# Patient Record
Sex: Male | Born: 1945 | Race: White | Hispanic: No | Marital: Married | Smoking: Never smoker
Health system: Southern US, Community
[De-identification: ages and names within clinical notes are randomized; demographics above are authoritative.]

## PROBLEM LIST (undated history)

## (undated) DIAGNOSIS — R1900 Intra-abdominal and pelvic swelling, mass and lump, unspecified site: Secondary | ICD-10-CM

## (undated) DIAGNOSIS — N138 Other obstructive and reflux uropathy: Secondary | ICD-10-CM

## (undated) DIAGNOSIS — R109 Unspecified abdominal pain: Secondary | ICD-10-CM

## (undated) DIAGNOSIS — K219 Gastro-esophageal reflux disease without esophagitis: Secondary | ICD-10-CM

## (undated) DIAGNOSIS — Z872 Personal history of diseases of the skin and subcutaneous tissue: Secondary | ICD-10-CM

## (undated) DIAGNOSIS — K8042 Calculus of bile duct with acute cholecystitis without obstruction: Secondary | ICD-10-CM

## (undated) DIAGNOSIS — I499 Cardiac arrhythmia, unspecified: Secondary | ICD-10-CM

## (undated) DIAGNOSIS — J189 Pneumonia, unspecified organism: Secondary | ICD-10-CM

## (undated) DIAGNOSIS — N401 Enlarged prostate with lower urinary tract symptoms: Secondary | ICD-10-CM

## (undated) DIAGNOSIS — K862 Cyst of pancreas: Secondary | ICD-10-CM

## (undated) DIAGNOSIS — N189 Chronic kidney disease, unspecified: Secondary | ICD-10-CM

## (undated) DIAGNOSIS — F419 Anxiety disorder, unspecified: Secondary | ICD-10-CM

## (undated) DIAGNOSIS — M1990 Unspecified osteoarthritis, unspecified site: Secondary | ICD-10-CM

## (undated) DIAGNOSIS — R739 Hyperglycemia, unspecified: Secondary | ICD-10-CM

## (undated) DIAGNOSIS — I219 Acute myocardial infarction, unspecified: Secondary | ICD-10-CM

## (undated) DIAGNOSIS — N3010 Interstitial cystitis (chronic) without hematuria: Secondary | ICD-10-CM

## (undated) HISTORY — DX: Hyperglycemia, unspecified: R73.9

## (undated) HISTORY — PX: CHOLECYSTECTOMY: SHX55

## (undated) HISTORY — PX: CATARACT EXTRACTION, BILATERAL: SHX1313

## (undated) HISTORY — PX: EYE SURGERY: SHX253

## (undated) HISTORY — DX: Chronic kidney disease, unspecified: N18.9

## (undated) HISTORY — PX: TONSILLECTOMY AND ADENOIDECTOMY: SUR1326

## (undated) HISTORY — PX: COLONOSCOPY W/ POLYPECTOMY: SHX1380

## (undated) HISTORY — DX: Personal history of diseases of the skin and subcutaneous tissue: Z87.2

## (undated) HISTORY — DX: Calculus of bile duct with acute cholecystitis without obstruction: K80.42

## (undated) HISTORY — PX: TENNIS ELBOW RELEASE/NIRSCHEL PROCEDURE: SHX6651

---

## 1998-03-04 HISTORY — PX: NECK SURGERY: SHX720

## 2001-03-05 ENCOUNTER — Encounter: Payer: Self-pay | Admitting: Neurosurgery

## 2001-03-05 ENCOUNTER — Inpatient Hospital Stay (HOSPITAL_COMMUNITY): Admission: RE | Admit: 2001-03-05 | Discharge: 2001-03-05 | Payer: Self-pay | Admitting: Neurosurgery

## 2001-03-26 ENCOUNTER — Encounter: Admission: RE | Admit: 2001-03-26 | Discharge: 2001-03-26 | Payer: Self-pay | Admitting: Neurosurgery

## 2001-03-26 ENCOUNTER — Encounter: Payer: Self-pay | Admitting: Neurosurgery

## 2001-04-13 ENCOUNTER — Encounter: Admission: RE | Admit: 2001-04-13 | Discharge: 2001-04-13 | Payer: Self-pay | Admitting: Infectious Diseases

## 2007-08-17 ENCOUNTER — Ambulatory Visit: Payer: Self-pay | Admitting: Unknown Physician Specialty

## 2010-02-10 ENCOUNTER — Encounter
Admission: RE | Admit: 2010-02-10 | Discharge: 2010-02-10 | Payer: Self-pay | Source: Home / Self Care | Attending: Neurosurgery | Admitting: Neurosurgery

## 2010-03-04 HISTORY — PX: BACK SURGERY: SHX140

## 2010-03-08 LAB — BASIC METABOLIC PANEL WITH GFR
BUN: 12 mg/dL (ref 6–23)
CO2: 30 meq/L (ref 19–32)
Calcium: 9.9 mg/dL (ref 8.4–10.5)
Chloride: 105 meq/L (ref 96–112)
Creatinine, Ser: 1.37 mg/dL (ref 0.4–1.5)
GFR calc non Af Amer: 52 mL/min — ABNORMAL LOW
Glucose, Bld: 81 mg/dL (ref 70–99)
Potassium: 5 meq/L (ref 3.5–5.1)
Sodium: 140 meq/L (ref 135–145)

## 2010-03-08 LAB — CBC
HCT: 47.9 % (ref 39.0–52.0)
Hemoglobin: 16.9 g/dL (ref 13.0–17.0)
MCH: 32.3 pg (ref 26.0–34.0)
MCHC: 35.3 g/dL (ref 30.0–36.0)
MCV: 91.6 fL (ref 78.0–100.0)
Platelets: 215 10*3/uL (ref 150–400)
RBC: 5.23 MIL/uL (ref 4.22–5.81)
RDW: 11.9 % (ref 11.5–15.5)
WBC: 8.1 10*3/uL (ref 4.0–10.5)

## 2010-03-08 LAB — ABO/RH: ABO/RH(D): B NEG

## 2010-03-08 LAB — TYPE AND SCREEN
ABO/RH(D): B NEG
Antibody Screen: NEGATIVE

## 2010-03-13 ENCOUNTER — Inpatient Hospital Stay (HOSPITAL_COMMUNITY)
Admission: RE | Admit: 2010-03-13 | Discharge: 2010-03-14 | Payer: Self-pay | Source: Home / Self Care | Attending: Neurosurgery | Admitting: Neurosurgery

## 2010-03-29 LAB — SURGICAL PCR SCREEN
MRSA, PCR: NEGATIVE
Staphylococcus aureus: NEGATIVE

## 2010-04-22 NOTE — Op Note (Signed)
NAMEDEMONI, GERGEN NO.:  [id number]  MEDICAL RECORD NO.:  [id number]          PATIENT TYPE:  INP  LOCATION:  3001                         FACILITY:  [facility]  PHYSICIAN:  Reinaldo Meeker, M.D. 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  PROCEDURE:  L3-4 and L4-5 anterolateral fusion via left retroperitoneal approach with PEEK interbody spacer at both levels followed by right L3- 4 and L4-5 percutaneous pedicle screw fixation with NuVasive percutaneous system.  SURGEON:  Reinaldo Meeker, MD  ASSISTANT:  Kathaleen Maser. Pool, MD  PROCEDURE IN DETAIL:  After placing lateral decubitus position left side up, the patient was aligned appropriately for access to the retroperitoneum and the disk spaces at L3-4 and L4-5.  At the L3-4, a linear incision was made in the flank in line with the interspace.  This was carried down to the muscle, but not through it.  We then made a more posterior incision and carried it down all the way into the retroperitoneum with blunt dissection and used finger dissection to sweep any contents free in the retroperitoneum.  We then turned our finger upward and revealed the L3-4 incision.  We passed our first dilator into the retroperitoneum without difficulty.  We then did sequential dilation through the psoas muscle using EMG monitoring to avoid any injury to the neural elements.  This was successfully done. We then placed our retractor into good position.  We then secured the retractor to the table, opened up in standard fashion, and then did EMG testing once again.  There were no evidence of bad readings.  We therefore secured the retractor to the disk space with the shim.  We then incised the disk with  a #15 blade and did a thorough clean-out.  We then used Cobb periosteal elevator to release the annulus on the opposite side and distracted the interspace with a variety of spacers. We got to the 10-mm standard.  We felt that was an excellent fit.  We then irrigated it copiously, took a 10 x 22 x 50 mm graft, filled it with a mixture of Actifuse and Osteocel Plus.  We then impacted the graft in good position which was followed under AP and lateral fluoroscopy in excellent position.  We then irrigated it copiously and controlled any bleeding with bipolar coagulation.  We then moved the retractor and checked our alignment once again and found the spacer to be in excellent position.  We then turned our attention to L4-5.  Once again, we used a linear incision above the L4-5 interspace, turned our finger upward and allowed access safely into the retroperitoneum.  The iliac crest somewhat limited our ability to put the retractor inferiorly, but we were able to get good access to the disk space.  We did EMG testing once again.  We passed through the psoas muscle and saw no evidence of abnormal readings.  We then secured the retractor to the disk space with the shim.  Once again, we incised it with the #15 blade and thoroughly it cleaned out with a variety of instruments.  Once again, we released the annulus on the opposite side with a Cobb elevator.  Once again, we did sequential distraction.  Once again, we felt that a 10-mm standard graft was a good choice.  We then chose a 10 x 50 x 80 mm graft at this level and filled it up with a mixture of Osteocel Plus and Actifuse and packed it without difficulty into the disk space.  This was once again followed into good position.  Large amounts of irrigation were carried at this time and any bleeding was controlled with bipolar coagulation.  We then irrigated once more and we moved the retractor without difficulty.  Fluoroscopy on AP and  lateral direction showed both interbody spacers to be in excellent position.  We then closed the wounds in the flank with Vicryl on the fascia, subcutaneous and subcuticular tissues and Dermabond was placed on the skin.  We then placed a sterile dressing and turned the patient into the prone position.  We then prepped the back in the usual sterile fashion and draped it as well.  Starting at L4, we made a small linear incision just lateral to the pedicle and passed a Jamshidi needle from a lateral to medial direction through the pedicle of L4.  We then passed a guidewire through and removed the Jamshidi needle.  We then did similar procedures at L3 and L5 once again passing the Jamshidi needle from a medial to lateral direction through the pedicle.  Once again, we used EMG monitoring to make sure for safe passage through the pedicle and followed in AP and lateral fluoroscopy.  When we had ultimately guidewires in, we started at L5.  We passed the tap attached to the dilator over the guidewire without difficulty.  We then tapped with the 5 x 5 mm tap.  We felt a 6 x 40 mm screw was good at this level.  We removed the tap in the working channel dilator.  We then placed a 65 x 40 mm screw at L5 with the tower attached.  We did similar procedure at L4 and L3.  We used a 6 x 45 mm screw at L3.  We did tack both levels and placed 65 mm screws.  We aligned the screws nicely on lateral fluoroscopy.  At this point, we passed a fascial dissector under the fascia through all 3towers.  We then passed a rod on the similar pathway and confirmed this in AP fluoroscopy.  We then used our top-loading screws and nuts to secure the rod to the top of the screws.  We then removed the rod passer and did final tightening with torque and counter torque.  We then with the towers without difficulty and final fluoroscopy in AP and lateral direction showed excellent placement of the interbody devices as well as the  screws and rod.  Large amounts of irrigation were carried out.  Any bleeding was controlled with unipolar coagulation.  We then closed with Vicryl on the subcutaneous and subcuticular tissues and staples were placed on the skin.  Sterile dressings were then applied.  The patient was extubated and taken to the recovery room in stable condition.          ______________________________ Reinaldo Meeker, M.D.     ROK/MEDQ  D:  03/13/2010  T:  03/14/2010  Job:  [id number]  Electronically Signed by Aliene Beams M.D. on 04/22/2010 10:47:42 PM

## 2010-04-23 ENCOUNTER — Ambulatory Visit
Admission: RE | Admit: 2010-04-23 | Discharge: 2010-04-23 | Disposition: A | Discharge status: Home / Self Care | Payer: 59 | Source: Ambulatory Visit | Attending: Neurosurgery | Admitting: Neurosurgery

## 2010-04-23 ENCOUNTER — Other Ambulatory Visit: Payer: Self-pay | Admitting: Neurosurgery

## 2010-04-23 DIAGNOSIS — M549 Dorsalgia, unspecified: Secondary | ICD-10-CM

## 2010-06-04 ENCOUNTER — Ambulatory Visit
Admission: RE | Admit: 2010-06-04 | Discharge: 2010-06-04 | Disposition: A | Discharge status: Home / Self Care | Payer: 59 | Source: Ambulatory Visit | Attending: Neurosurgery | Admitting: Neurosurgery

## 2010-06-04 ENCOUNTER — Other Ambulatory Visit: Payer: Self-pay | Admitting: Neurosurgery

## 2010-06-04 DIAGNOSIS — M48061 Spinal stenosis, lumbar region without neurogenic claudication: Secondary | ICD-10-CM

## 2010-06-04 DIAGNOSIS — M533 Sacrococcygeal disorders, not elsewhere classified: Secondary | ICD-10-CM

## 2010-07-20 NOTE — Op Note (Signed)
[place]  Patient:    DESMEN, SCHOFFSTALL Visit Number: [id number] MRN: [id number]          Service Type: Attending:  Reinaldo Meeker, M.D. Dictated by:   Reinaldo Meeker, M.D. Proc. 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  OPERATION PERFORMED:  C6-7 anterior cervical diskectomy with fibular bone bank fusion followed by Zephyr anterior cervical plating with the operating microscope.  SURGEON:  Reinaldo Meeker, M.D.  ASSISTANT:  Donalee Citrin, Montez Hageman.  ANESTHESIA:  DESCRIPTION OF PROCEDURE:  After being placed in the supine position in 10 pounds of halter traction the patients neck was prepped and draped in the usual sterile fashion.  Fluoroscopy was used to identify the appropriate level and a transverse incision was then made in the right anterior neck.  This started at the midline and headed toward the medial aspect of the sternocleidomastoid muscle.  The platysma muscle was then incised transversely.  The natural fascial plane between the strap muscles medially, the sternocleidomastoid laterally was identified and followed down to the anterior aspect of the cervical spine.  The longus coli muscles were identified and split in the midline and stripped away bilaterally with the Optician, dispensing.  A self-retaining retractor was placed for exposure and fluoroscopy showed approach to the appropriate level.  Using a 15 blade, the annulus of the disk was incised.  Using pituitary rongeurs and curets, approximately 90% of the disk material was removed.  A high speed drill was used to widen the interspace as well.  At this point the microscope was draped, brought into the field and used for the remainder of the case. Using microdissection technique, the remainder of the disk material, posterior longitudinal  ligament was removed.  Obvious disk herniation was noted toward the left side.  Disk fragments were removed.  The posterior longitudinal ligament was then incised and cut transversely and the cut edge removed with a Kerrison punch.  Additional herniated disk material towards the left side was then removed giving excellent decompression of the left side of the spinal dura and the C7 nerve root.  A similar decompression was carried out towards the patients right, asymptomatic side.  At this point inspection was carried out in all directions for any evidence of residual compression and none could be identified.  Large amounts of irrigation were carried out.  Measurements were taken and an 8 mm patellar plug was reconstituted.  After irrigating once more, the plug was impacted without difficulty.  Fluoroscopy showed the plate to be in good position.  An appropriate length Zephyr plate was then chosen. Drill holes were then placed followed by placement of 13 mm screws x 4 and the locking mechanism was then secured.  At this point fluoroscopy was used once to show good position of the plugs and plate.  Large amounts of irrigation were carried out at this time and any bleeding controlled with bipolar coagulation.  The wound was then closed using interrupted Vicryl on the platysma muscle, inverted 5-0 PDS in the subcutaneous layer and Steri-Strips on the skin.  A sterile dressing and soft collar were applied.  The patient was extubated and taken to the recovery room in stable. Dictated by:   Rolanda Lundborg.  Kritzer, M.D. Attending:  Reinaldo Meeker, M.D. DD:  03/05/01 TD:  03/05/01 Job: [id number]

## 2011-04-30 IMAGING — CR DG LUMBAR SPINE 2-3V
3 series · 3 of 3 positions shown · non-contrast
Comparison: Lumbar spine films of 04/23/2010

CLINICAL DATA: Lumbar spine fusion, follow-up

LUMBAR SPINE - 2-3 VIEW

[t l-spine a.p.]
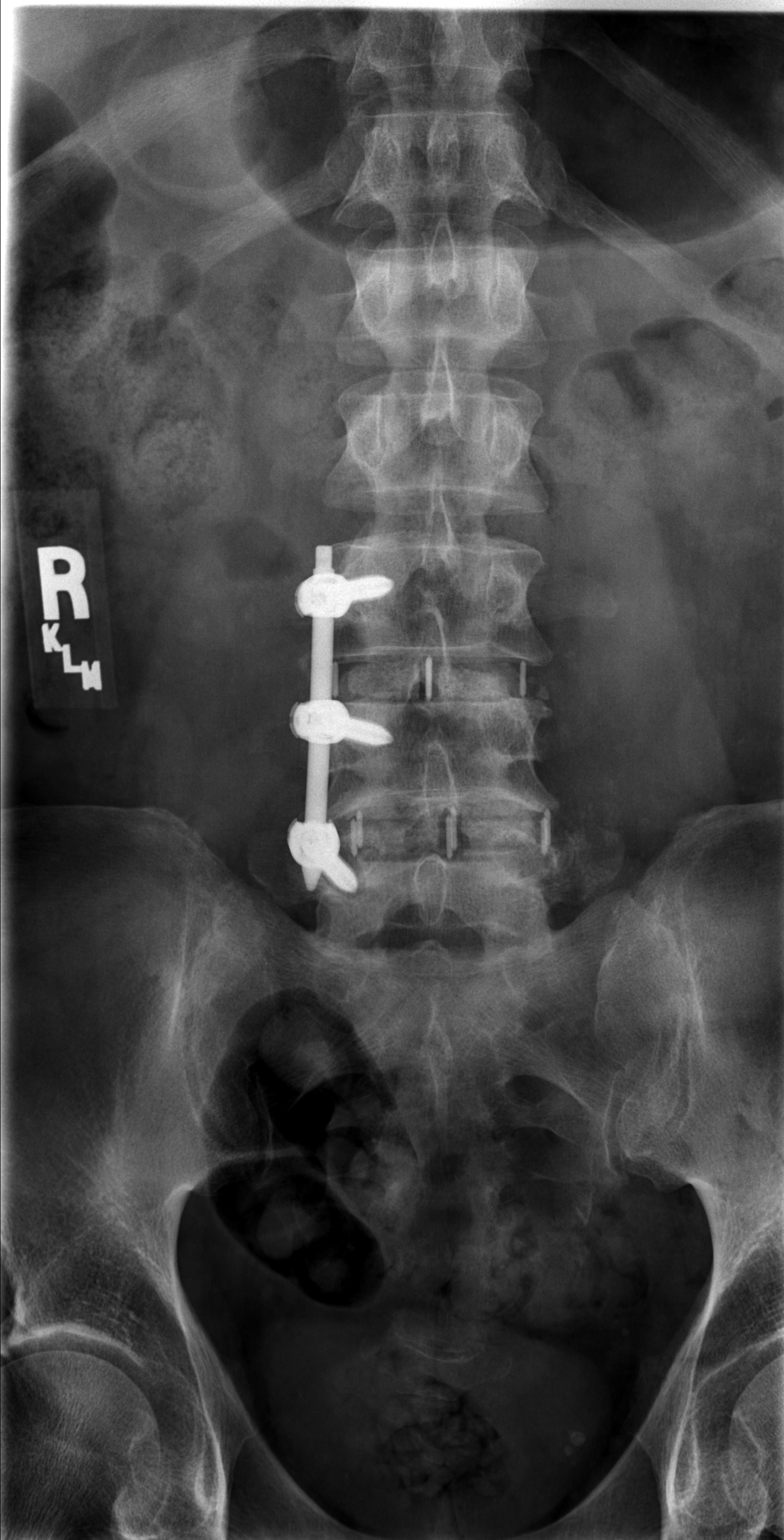

[t l-spine lat]
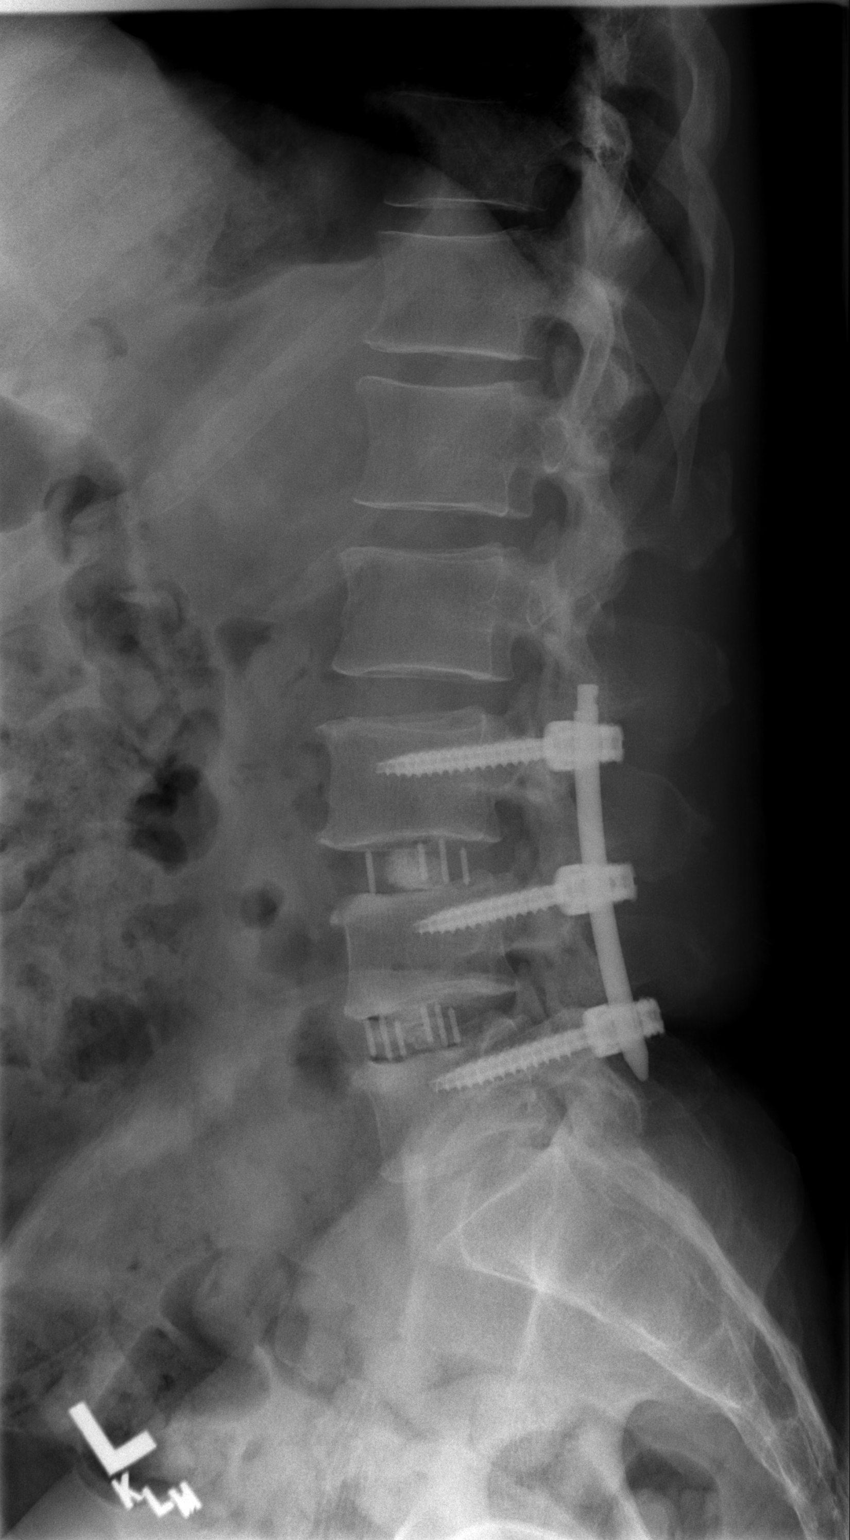

[t l-spine l5-s1 spot]
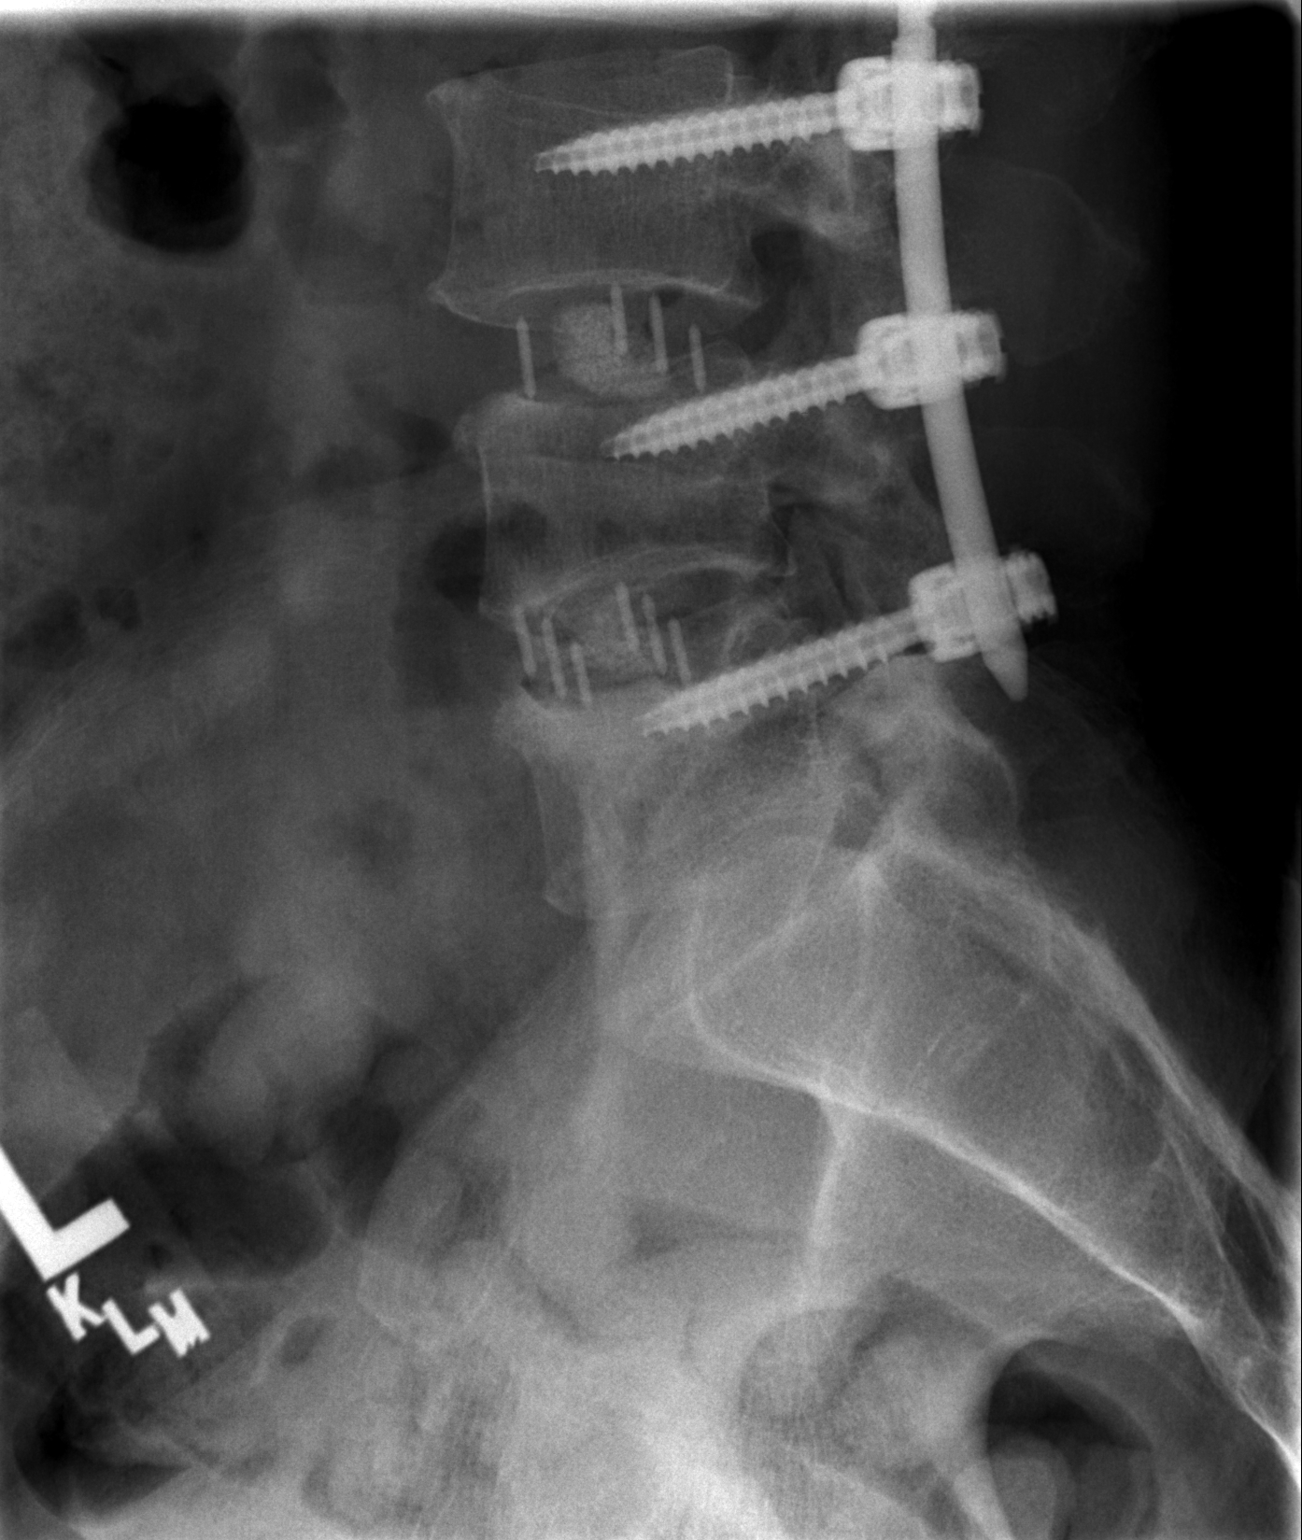

[3 of 3 positions shown; findings below may reference images not displayed]

FINDINGS: Transpedicular fixation hardware is noted on the right
from L3-L5.  There is lucency surrounding the screw at the L5 level
which suggests some loosening.  Interbody fusion plugs at L3-4 and
L4-5 are unchanged in position and normal alignment is maintained.
IMPRESSION: 1.  Normal alignment.  Normal disc spaces.
2.  However there is some lucency around the transpedicular screw
at L5 suggesting some loosening.

## 2012-08-11 LAB — HM COLONOSCOPY

## 2014-05-23 ENCOUNTER — Ambulatory Visit: Payer: Self-pay | Admitting: Family Medicine

## 2015-04-06 ENCOUNTER — Encounter: Payer: Self-pay | Admitting: Family Medicine

## 2015-04-06 ENCOUNTER — Ambulatory Visit (INDEPENDENT_AMBULATORY_CARE_PROVIDER_SITE_OTHER): Payer: PPO | Admitting: Family Medicine

## 2015-04-06 VITALS — BP 110/78 | HR 78 | Temp 97.5°F | Resp 16 | Ht 73.0 in | Wt 201.8 lb

## 2015-04-06 DIAGNOSIS — Z131 Encounter for screening for diabetes mellitus: Secondary | ICD-10-CM

## 2015-04-06 DIAGNOSIS — Z23 Encounter for immunization: Secondary | ICD-10-CM

## 2015-04-06 DIAGNOSIS — N3010 Interstitial cystitis (chronic) without hematuria: Secondary | ICD-10-CM | POA: Diagnosis not present

## 2015-04-06 DIAGNOSIS — N401 Enlarged prostate with lower urinary tract symptoms: Secondary | ICD-10-CM | POA: Insufficient documentation

## 2015-04-06 DIAGNOSIS — Z0000 Encounter for general adult medical examination without abnormal findings: Secondary | ICD-10-CM

## 2015-04-06 DIAGNOSIS — L570 Actinic keratosis: Secondary | ICD-10-CM | POA: Insufficient documentation

## 2015-04-06 DIAGNOSIS — N138 Other obstructive and reflux uropathy: Secondary | ICD-10-CM | POA: Insufficient documentation

## 2015-04-06 DIAGNOSIS — L03039 Cellulitis of unspecified toe: Secondary | ICD-10-CM | POA: Insufficient documentation

## 2015-04-06 DIAGNOSIS — R7303 Prediabetes: Secondary | ICD-10-CM | POA: Insufficient documentation

## 2015-04-06 DIAGNOSIS — Z13220 Encounter for screening for lipoid disorders: Secondary | ICD-10-CM

## 2015-04-06 DIAGNOSIS — R739 Hyperglycemia, unspecified: Secondary | ICD-10-CM | POA: Insufficient documentation

## 2015-04-06 DIAGNOSIS — M25519 Pain in unspecified shoulder: Secondary | ICD-10-CM | POA: Insufficient documentation

## 2015-04-06 NOTE — Patient Instructions (Signed)
We will call you with the lab result. Try two Aleve twice daly with food for your sore ankle.

## 2015-04-06 NOTE — Progress Notes (Signed)
Subjective:     Patient ID: Rodney Hall., male   DOB: November 07, 1945, 70 y.o.   MRN: [id number]  HPI  Chief Complaint  Patient presents with  . Medicare Wellness    Patient is present in office today for his annual physical, he states that he has no questions or concerns at this time. Patient is due today for hos Pneumo and Tdap vaccine.  Continues to be followed by urology, Dr. Yves Dill, for interstitial cystitis and BPH; dermatology, Dr. Nehemiah Massed; and podiatry, Dr. Elvina Mattes.   Review of Systems General: Feeling well; exercises daily. Prior fall  Addressed at office visit last year. HEENT: regular dental visits and eye exams. Hx of cataract surgery. Cardiovascular: no chest pain, shortness of breath, or palpitations GI: no heartburn, no change in bowel habits or blood in the stool GU: nocturia x 3-4, no change in bladder habits  Neuro: no change in memory; defers cognitive screen Psychiatric: not depressed Musculoskeletal: left Achille pain associated with playing racquet sports.    Objective:   Physical Exam  Constitutional: He appears well-developed and well-nourished. No distress.  Eyes: Pupils aphakic, EOMI Neck: no thyromegaly, tenderness or nodules, carotids palpable without bruits ENT: TM's intact without inflammation;  Lungs: Clear Heart : RRR without murmur or gallop Abd: bowel sounds present, soft, non-tender, no organomegaly Rectal: deferred to urology Extremities: no edema, Left DF/PF 5/5, mild tenderness and distal Achilles tendon.      Assessment:    1. Need for diphtheria-tetanus-pertussis (Tdap) vaccine, adult/adolescent - Tdap vaccine greater than or equal to 7yo IM  2. Need for pneumococcal vaccination - Pneumococcal polysaccharide vaccine 23-valent greater than or equal to 2yo subcutaneous/IM  3. Medicare annual wellness visit, subsequent  4. Screening for cholesterol level - Lipid panel  5. Screening for diabetes mellitus - Comprehensive metabolic  panel    Plan:   Further f/u pending lab work. Discussed use of nsaid's for his left ankle.

## 2015-04-07 ENCOUNTER — Telehealth: Payer: Self-pay

## 2015-04-07 LAB — LIPID PANEL
Chol/HDL Ratio: 5.2 ratio — ABNORMAL HIGH (ref 0.0–5.0)
Cholesterol, Total: 183 mg/dL (ref 100–199)
HDL: 35 mg/dL — ABNORMAL LOW
LDL Calculated: 98 mg/dL (ref 0–99)
Triglycerides: 252 mg/dL — ABNORMAL HIGH (ref 0–149)
VLDL Cholesterol Cal: 50 mg/dL — ABNORMAL HIGH (ref 5–40)

## 2015-04-07 LAB — COMPREHENSIVE METABOLIC PANEL WITH GFR
ALT: 14 [IU]/L (ref 0–44)
AST: 17 [IU]/L (ref 0–40)
Albumin/Globulin Ratio: 1.5 (ref 1.1–2.5)
Albumin: 4.3 g/dL (ref 3.6–4.8)
Alkaline Phosphatase: 69 [IU]/L (ref 39–117)
BUN/Creatinine Ratio: 10 (ref 10–22)
BUN: 13 mg/dL (ref 8–27)
Bilirubin Total: 0.5 mg/dL (ref 0.0–1.2)
CO2: 25 mmol/L (ref 18–29)
Calcium: 9.2 mg/dL (ref 8.6–10.2)
Chloride: 100 mmol/L (ref 96–106)
Creatinine, Ser: 1.27 mg/dL (ref 0.76–1.27)
GFR calc Af Amer: 66 mL/min/{1.73_m2}
GFR calc non Af Amer: 57 mL/min/{1.73_m2} — ABNORMAL LOW
Globulin, Total: 2.8 g/dL (ref 1.5–4.5)
Glucose: 105 mg/dL — ABNORMAL HIGH (ref 65–99)
Potassium: 4.4 mmol/L (ref 3.5–5.2)
Sodium: 139 mmol/L (ref 134–144)
Total Protein: 7.1 g/dL (ref 6.0–8.5)

## 2015-04-07 NOTE — Telephone Encounter (Signed)
Advised patient of lab report. KW 

## 2015-04-07 NOTE — Telephone Encounter (Signed)
[id number] 

## 2015-04-07 NOTE — Telephone Encounter (Signed)
-----   Message from Carmon Ginsberg, [place] sent at 04/07/2015  7:42 AM EST ----- Labs stable. Sugar mildly elevated-continue regular exercise. Consider rechecking sugar in 3 months.

## 2015-04-17 DIAGNOSIS — L820 Inflamed seborrheic keratosis: Secondary | ICD-10-CM | POA: Diagnosis not present

## 2015-04-17 DIAGNOSIS — L821 Other seborrheic keratosis: Secondary | ICD-10-CM | POA: Diagnosis not present

## 2015-04-17 DIAGNOSIS — D229 Melanocytic nevi, unspecified: Secondary | ICD-10-CM | POA: Diagnosis not present

## 2015-04-17 DIAGNOSIS — D1800 Hemangioma unspecified site: Secondary | ICD-10-CM | POA: Diagnosis not present

## 2015-04-17 DIAGNOSIS — L570 Actinic keratosis: Secondary | ICD-10-CM | POA: Diagnosis not present

## 2015-04-17 DIAGNOSIS — Z1283 Encounter for screening for malignant neoplasm of skin: Secondary | ICD-10-CM | POA: Diagnosis not present

## 2015-04-17 DIAGNOSIS — L812 Freckles: Secondary | ICD-10-CM | POA: Diagnosis not present

## 2015-04-17 DIAGNOSIS — L578 Other skin changes due to chronic exposure to nonionizing radiation: Secondary | ICD-10-CM | POA: Diagnosis not present

## 2015-05-30 ENCOUNTER — Ambulatory Visit: Payer: Self-pay | Admitting: Family Medicine

## 2015-05-30 DIAGNOSIS — M79675 Pain in left toe(s): Secondary | ICD-10-CM | POA: Diagnosis not present

## 2015-05-30 DIAGNOSIS — L600 Ingrowing nail: Secondary | ICD-10-CM | POA: Diagnosis not present

## 2015-06-05 DIAGNOSIS — M7662 Achilles tendinitis, left leg: Secondary | ICD-10-CM | POA: Diagnosis not present

## 2015-09-18 DIAGNOSIS — R3915 Urgency of urination: Secondary | ICD-10-CM | POA: Diagnosis not present

## 2015-09-18 DIAGNOSIS — R350 Frequency of micturition: Secondary | ICD-10-CM | POA: Diagnosis not present

## 2015-09-18 DIAGNOSIS — N3010 Interstitial cystitis (chronic) without hematuria: Secondary | ICD-10-CM | POA: Diagnosis not present

## 2015-09-18 DIAGNOSIS — N401 Enlarged prostate with lower urinary tract symptoms: Secondary | ICD-10-CM | POA: Diagnosis not present

## 2016-04-18 DIAGNOSIS — L820 Inflamed seborrheic keratosis: Secondary | ICD-10-CM | POA: Diagnosis not present

## 2016-04-18 DIAGNOSIS — L918 Other hypertrophic disorders of the skin: Secondary | ICD-10-CM | POA: Diagnosis not present

## 2016-04-18 DIAGNOSIS — D229 Melanocytic nevi, unspecified: Secondary | ICD-10-CM | POA: Diagnosis not present

## 2016-04-18 DIAGNOSIS — D1800 Hemangioma unspecified site: Secondary | ICD-10-CM | POA: Diagnosis not present

## 2016-04-18 DIAGNOSIS — L603 Nail dystrophy: Secondary | ICD-10-CM | POA: Diagnosis not present

## 2016-04-18 DIAGNOSIS — Z1283 Encounter for screening for malignant neoplasm of skin: Secondary | ICD-10-CM | POA: Diagnosis not present

## 2016-04-18 DIAGNOSIS — L812 Freckles: Secondary | ICD-10-CM | POA: Diagnosis not present

## 2016-04-18 DIAGNOSIS — L821 Other seborrheic keratosis: Secondary | ICD-10-CM | POA: Diagnosis not present

## 2016-04-18 DIAGNOSIS — L570 Actinic keratosis: Secondary | ICD-10-CM | POA: Diagnosis not present

## 2016-04-18 DIAGNOSIS — L578 Other skin changes due to chronic exposure to nonionizing radiation: Secondary | ICD-10-CM | POA: Diagnosis not present

## 2016-04-22 ENCOUNTER — Ambulatory Visit (INDEPENDENT_AMBULATORY_CARE_PROVIDER_SITE_OTHER): Payer: PPO | Admitting: Family Medicine

## 2016-04-22 ENCOUNTER — Encounter: Payer: Self-pay | Admitting: Family Medicine

## 2016-04-22 VITALS — BP 110/70 | HR 71 | Temp 98.0°F | Resp 16 | Ht 73.0 in | Wt 202.0 lb

## 2016-04-22 DIAGNOSIS — Z0000 Encounter for general adult medical examination without abnormal findings: Secondary | ICD-10-CM | POA: Diagnosis not present

## 2016-04-22 DIAGNOSIS — Z13220 Encounter for screening for lipoid disorders: Secondary | ICD-10-CM | POA: Diagnosis not present

## 2016-04-22 DIAGNOSIS — Z131 Encounter for screening for diabetes mellitus: Secondary | ICD-10-CM | POA: Diagnosis not present

## 2016-04-22 NOTE — Progress Notes (Addendum)
Subjective:     Patient ID: Rodney Hall., male   DOB: February 19, 1946, 71 y.o.   MRN: [id number]  HPI  Chief Complaint  Patient presents with  . Annual Exam    Patient comes into office today for his annual physical he states that he has no questions or concerns today. Patient is eating a well balanced diet, exercising 5x a week and sleeping average of 8-9hrs a night. Patients last reported Tdap 04/06/15, Flu 12/2015, Prevnar 03/09/14, Pneumo 04/06/15 and Colonoscopy 08/12/12.   Continues to be followed by dermatology, Nehemiah Massed, and Urology, Yves Dill. Wishes to get new shingles vaccine if covered by his insurance in the future.   Review of Systems General: Feeling well, Reports regular gym workout on the stationary bicycle. HEENT: regular dental visits but no recent eye exam. Reports early cataract surgery in his 13's. Encourged to update eye exam. Cardiovascular: no chest pain, shortness of breath, or palpitations GI: no heartburn, no change in bowel habits. He will be due f/u colonoscopy in 2019 Vira Agar). GU:  no change in bladder habits: followed by urology for BPH and interstitial cystitis. Neuro: no change in memory; defers cognitive screening Psychiatric: not depressed Musculoskeletal: Mild tendonitis in his right shouler; mild low back pain due to DDD and prior back surgeries.    Objective:   Physical Exam Eyes: PERRLA, EOMI Neck: no thyromegaly, tenderness or nodules, no carotid bruits, cervical adenopathy ENT: TM's intact without inflammation; Tonsils absent Lungs: Clear Heart : RRR without murmur or gallop Abd: bowel sounds present, soft, non-tender, no organomegaly Extremities: no edema    Assessment:    1. Medicare annual wellness visit, subsequent  2. Screening for cholesterol level - Lipid panel  3. Screening for diabetes mellitus - Comprehensive metabolic panel    Plan:    Further f/u pending lab work. Does not wish any further information on advanced directives

## 2016-04-22 NOTE — Patient Instructions (Signed)
We will call you with the lab results. 

## 2016-04-23 ENCOUNTER — Telehealth: Payer: Self-pay

## 2016-04-23 LAB — LIPID PANEL
Chol/HDL Ratio: 5 ratio (ref 0.0–5.0)
Cholesterol, Total: 175 mg/dL (ref 100–199)
HDL: 35 mg/dL — ABNORMAL LOW
LDL Calculated: 102 mg/dL — ABNORMAL HIGH (ref 0–99)
Triglycerides: 191 mg/dL — ABNORMAL HIGH (ref 0–149)
VLDL Cholesterol Cal: 38 mg/dL (ref 5–40)

## 2016-04-23 LAB — COMPREHENSIVE METABOLIC PANEL WITH GFR
ALT: 14 [IU]/L (ref 0–44)
AST: 12 [IU]/L (ref 0–40)
Albumin/Globulin Ratio: 1.5 (ref 1.2–2.2)
Albumin: 4.2 g/dL (ref 3.5–4.8)
Alkaline Phosphatase: 71 [IU]/L (ref 39–117)
BUN/Creatinine Ratio: 9 — ABNORMAL LOW (ref 10–24)
BUN: 12 mg/dL (ref 8–27)
Bilirubin Total: 0.4 mg/dL (ref 0.0–1.2)
CO2: 25 mmol/L (ref 18–29)
Calcium: 9.2 mg/dL (ref 8.6–10.2)
Chloride: 100 mmol/L (ref 96–106)
Creatinine, Ser: 1.38 mg/dL — ABNORMAL HIGH (ref 0.76–1.27)
GFR calc Af Amer: 59 mL/min/{1.73_m2} — ABNORMAL LOW
GFR calc non Af Amer: 51 mL/min/{1.73_m2} — ABNORMAL LOW
Globulin, Total: 2.8 g/dL (ref 1.5–4.5)
Glucose: 100 mg/dL — ABNORMAL HIGH (ref 65–99)
Potassium: 4.7 mmol/L (ref 3.5–5.2)
Sodium: 139 mmol/L (ref 134–144)
Total Protein: 7 g/dL (ref 6.0–8.5)

## 2016-04-23 NOTE — Telephone Encounter (Signed)
[id number] 

## 2016-04-23 NOTE — Telephone Encounter (Signed)
Patient advised.KW 

## 2016-04-23 NOTE — Telephone Encounter (Signed)
Pt returned call and request call back with in the hour. Thanks TNP

## 2016-04-23 NOTE — Telephone Encounter (Signed)
-----   Message from Carmon Ginsberg, [place] sent at 04/23/2016  7:35 AM EST ----- Cholesterol ok. Mild decline in kidney function-would recommend recheck in 3 months (call for lab slip)

## 2016-06-05 DIAGNOSIS — M4727 Other spondylosis with radiculopathy, lumbosacral region: Secondary | ICD-10-CM | POA: Diagnosis not present

## 2016-06-05 DIAGNOSIS — M545 Low back pain, unspecified: Secondary | ICD-10-CM | POA: Insufficient documentation

## 2016-06-14 ENCOUNTER — Other Ambulatory Visit: Payer: Self-pay | Admitting: Neurological Surgery

## 2016-06-14 DIAGNOSIS — M4727 Other spondylosis with radiculopathy, lumbosacral region: Secondary | ICD-10-CM

## 2016-06-17 ENCOUNTER — Ambulatory Visit
Admission: RE | Admit: 2016-06-17 | Discharge: 2016-06-17 | Disposition: A | Discharge status: Home / Self Care | Payer: PPO | Source: Ambulatory Visit | Attending: Neurological Surgery | Admitting: Neurological Surgery

## 2016-06-17 DIAGNOSIS — M545 Low back pain: Secondary | ICD-10-CM | POA: Diagnosis not present

## 2016-06-17 DIAGNOSIS — M4727 Other spondylosis with radiculopathy, lumbosacral region: Secondary | ICD-10-CM

## 2016-06-19 DIAGNOSIS — M545 Low back pain: Secondary | ICD-10-CM | POA: Diagnosis not present

## 2016-06-25 DIAGNOSIS — M545 Low back pain: Secondary | ICD-10-CM | POA: Diagnosis not present

## 2016-06-26 DIAGNOSIS — M48061 Spinal stenosis, lumbar region without neurogenic claudication: Secondary | ICD-10-CM | POA: Diagnosis not present

## 2016-06-26 DIAGNOSIS — Z981 Arthrodesis status: Secondary | ICD-10-CM | POA: Diagnosis not present

## 2016-06-26 DIAGNOSIS — M4727 Other spondylosis with radiculopathy, lumbosacral region: Secondary | ICD-10-CM | POA: Diagnosis not present

## 2016-06-27 DIAGNOSIS — M545 Low back pain: Secondary | ICD-10-CM | POA: Diagnosis not present

## 2016-07-02 DIAGNOSIS — Z6827 Body mass index (BMI) 27.0-27.9, adult: Secondary | ICD-10-CM | POA: Diagnosis not present

## 2016-07-02 DIAGNOSIS — M4727 Other spondylosis with radiculopathy, lumbosacral region: Secondary | ICD-10-CM | POA: Diagnosis not present

## 2016-07-03 DIAGNOSIS — M4727 Other spondylosis with radiculopathy, lumbosacral region: Secondary | ICD-10-CM | POA: Diagnosis not present

## 2016-07-03 DIAGNOSIS — M545 Low back pain: Secondary | ICD-10-CM | POA: Diagnosis not present

## 2016-07-04 ENCOUNTER — Other Ambulatory Visit: Payer: Self-pay | Admitting: Neurological Surgery

## 2016-07-22 ENCOUNTER — Encounter (HOSPITAL_COMMUNITY): Payer: Self-pay

## 2016-07-22 ENCOUNTER — Encounter (HOSPITAL_COMMUNITY)
Admission: RE | Admit: 2016-07-22 | Discharge: 2016-07-22 | Disposition: A | Discharge status: Home / Self Care | Payer: PPO | Source: Ambulatory Visit | Attending: Neurological Surgery | Admitting: Neurological Surgery

## 2016-07-22 DIAGNOSIS — M4727 Other spondylosis with radiculopathy, lumbosacral region: Secondary | ICD-10-CM | POA: Insufficient documentation

## 2016-07-22 DIAGNOSIS — Z01818 Encounter for other preprocedural examination: Secondary | ICD-10-CM | POA: Insufficient documentation

## 2016-07-22 HISTORY — DX: Unspecified osteoarthritis, unspecified site: M19.90

## 2016-07-22 HISTORY — DX: Anxiety disorder, unspecified: F41.9

## 2016-07-22 LAB — TYPE AND SCREEN
ABO/RH(D): B NEG
Antibody Screen: NEGATIVE

## 2016-07-22 LAB — BASIC METABOLIC PANEL WITH GFR
Anion gap: 7 (ref 5–15)
BUN: 12 mg/dL (ref 6–20)
CO2: 27 mmol/L (ref 22–32)
Calcium: 9.5 mg/dL (ref 8.9–10.3)
Chloride: 106 mmol/L (ref 101–111)
Creatinine, Ser: 1.43 mg/dL — ABNORMAL HIGH (ref 0.61–1.24)
GFR calc Af Amer: 56 mL/min — ABNORMAL LOW
GFR calc non Af Amer: 48 mL/min — ABNORMAL LOW
Glucose, Bld: 108 mg/dL — ABNORMAL HIGH (ref 65–99)
Potassium: 4.4 mmol/L (ref 3.5–5.1)
Sodium: 140 mmol/L (ref 135–145)

## 2016-07-22 LAB — CBC
HCT: 47.1 % (ref 39.0–52.0)
Hemoglobin: 16.1 g/dL (ref 13.0–17.0)
MCH: 30.6 pg (ref 26.0–34.0)
MCHC: 34.2 g/dL (ref 30.0–36.0)
MCV: 89.5 fL (ref 78.0–100.0)
Platelets: 191 10*3/uL (ref 150–400)
RBC: 5.26 MIL/uL (ref 4.22–5.81)
RDW: 12.5 % (ref 11.5–15.5)
WBC: 5.5 10*3/uL (ref 4.0–10.5)

## 2016-07-22 LAB — SURGICAL PCR SCREEN
MRSA, PCR: NEGATIVE
Staphylococcus aureus: NEGATIVE

## 2016-07-22 NOTE — Progress Notes (Signed)
PCp is Dr Caryn Section at [facility]. States he saw a cardiologist many years ago 15--20 years ago due to anxiety. Denies ever having a card cath, or echo states he had a stress test that was normal. Denies any chest pain, couth, or fever.

## 2016-07-22 NOTE — Pre-Procedure Instructions (Signed)
Mohammed Kindle.  07/22/2016      [facility], [place] - [address] [place] [address] Phone: [phone] Fax: [phone]    Your procedure is scheduled on May 29.  Report to [facility] Admitting at 530 A.M.  Call this number if you have problems the morning of surgery:  [phone]   Remember:  Do not eat food or drink liquids after midnight.  Take these medicines the morning of surgery with A SIP OF WATER [place]  Stop taking aspirin, BC's, Goody's, Herbal medications, Fish Oil, Ibuprofen, Advil, Motrin, Vitamins   Do not wear jewelry, make-up or nail polish.  Do not wear lotions, powders, or perfumes, or deoderant.  Do not shave 48 hours prior to surgery.  Men may shave face and neck.  Do not bring valuables to the hospital.  [facility] is not responsible for any belongings or valuables.  Contacts, dentures or bridgework may not be worn into surgery.  Leave your suitcase in the car.  After surgery it may be brought to your room.  For patients admitted to the hospital, discharge time will be determined by your treatment team.  Patients discharged the day of surgery will not be allowed to drive home.    Special instructions:  [facility] - Preparing for Surgery  Before surgery, you can play an important role.  Because skin is not sterile, your skin needs to be as free of germs as possible.  You can reduce the number of germs on you skin by washing with CHG (chlorahexidine gluconate) soap before surgery.  CHG is an antiseptic cleaner which kills germs and bonds with the skin to continue killing germs even after washing.  Please DO NOT use if you have an allergy to CHG or antibacterial soaps.  If your skin becomes reddened/irritated stop using the CHG and inform your nurse when you arrive at Short Stay.  Do not shave (including legs and underarms) for at least 48 hours prior to the first CHG shower.  You may  shave your face.  Please follow these instructions carefully:   1.  Shower with CHG Soap the night before surgery and the   morning of Surgery.  2.  If you choose to wash your hair, wash your hair first as usual with your  normal shampoo.  3.  After you shampoo, rinse your hair and body thoroughly to remove the Shampoo.  4.  Use CHG as you would any other liquid soap.  You can apply chg directly  to the skin and wash gently with scrungie or a clean washcloth.  5.  Apply the CHG Soap to your body ONLY FROM THE NECK DOWN.   Do not use on open wounds or open sores.  Avoid contact with your eyes,  ears, mouth and genitals (private parts).  Wash genitals (private parts)  with your normal soap.  6.  Wash thoroughly, paying special attention to the area where your surgery   will be performed.  7.  Thoroughly rinse your body with warm water from the neck down.  8.  DO NOT shower/wash with your normal soap after using and rinsing off   the CHG Soap.  9.  Pat yourself dry with a clean towel.            10.  Wear clean pajamas.            11.  Place clean sheets on  your bed the night of your first shower and do not sleep with pets.  Day of Surgery  Do not apply any lotions/deoderants the morning of surgery.  Please wear clean clothes to the hospital/surgery center.     Please read over the following fact sheets that you were given. Pain Booklet, Coughing and Deep Breathing, MRSA Information and Surgical Site Infection Prevention

## 2016-07-24 ENCOUNTER — Ambulatory Visit (INDEPENDENT_AMBULATORY_CARE_PROVIDER_SITE_OTHER): Payer: PPO | Admitting: Family Medicine

## 2016-07-24 ENCOUNTER — Encounter: Payer: Self-pay | Admitting: Family Medicine

## 2016-07-24 VITALS — BP 112/78 | HR 97 | Temp 98.1°F | Resp 16 | Wt 197.8 lb

## 2016-07-24 DIAGNOSIS — R944 Abnormal results of kidney function studies: Secondary | ICD-10-CM

## 2016-07-24 NOTE — Patient Instructions (Signed)
We will recheck your kidney function again in 3 months.

## 2016-07-24 NOTE — Progress Notes (Signed)
Subjective:     Patient ID: Rodney Hall., male   DOB: Oct 17, 1945, 71 y.o.   MRN: [id number]  HPI  Chief Complaint  Patient presents with  . Abnormal Lab    Patient returns to office today for 3 month follow up, to readdress declined kidney function from labs drawn from 04/22/16.   He has just had labs redrawn per pre-op for further back surgery at the end of the month. GFR is mildly declined but may reflect nsaid use.   Review of Systems     Objective:   Physical Exam  Constitutional: He appears well-developed and well-nourished. No distress.       Assessment:    1. Kidney function test abnormal: stable    Plan:    Repeat renal profile in 3 months. Discussed minimizing use of nsaid's .

## 2016-07-30 ENCOUNTER — Encounter (HOSPITAL_COMMUNITY): Payer: Self-pay | Admitting: General Practice

## 2016-07-30 ENCOUNTER — Inpatient Hospital Stay (HOSPITAL_COMMUNITY): Payer: PPO | Admitting: Anesthesiology

## 2016-07-30 ENCOUNTER — Encounter (HOSPITAL_COMMUNITY)
Admission: RE | Disposition: A | Discharge status: Home / Self Care | Payer: Self-pay | Source: Ambulatory Visit | Attending: Neurological Surgery

## 2016-07-30 ENCOUNTER — Inpatient Hospital Stay (HOSPITAL_COMMUNITY)
Admission: RE | Admit: 2016-07-30 | Discharge: 2016-07-31 | DRG: 460 | Disposition: A | Discharge status: Home / Self Care | Payer: PPO | Source: Ambulatory Visit | Attending: Neurological Surgery | Admitting: Neurological Surgery

## 2016-07-30 ENCOUNTER — Inpatient Hospital Stay (HOSPITAL_COMMUNITY): Payer: PPO

## 2016-07-30 DIAGNOSIS — M48061 Spinal stenosis, lumbar region without neurogenic claudication: Secondary | ICD-10-CM | POA: Diagnosis present

## 2016-07-30 DIAGNOSIS — M4726 Other spondylosis with radiculopathy, lumbar region: Secondary | ICD-10-CM | POA: Diagnosis not present

## 2016-07-30 DIAGNOSIS — Z881 Allergy status to other antibiotic agents status: Secondary | ICD-10-CM | POA: Diagnosis not present

## 2016-07-30 DIAGNOSIS — Z419 Encounter for procedure for purposes other than remedying health state, unspecified: Secondary | ICD-10-CM

## 2016-07-30 DIAGNOSIS — M25519 Pain in unspecified shoulder: Secondary | ICD-10-CM | POA: Diagnosis not present

## 2016-07-30 DIAGNOSIS — M549 Dorsalgia, unspecified: Secondary | ICD-10-CM | POA: Diagnosis present

## 2016-07-30 DIAGNOSIS — Z9842 Cataract extraction status, left eye: Secondary | ICD-10-CM | POA: Diagnosis not present

## 2016-07-30 DIAGNOSIS — M4727 Other spondylosis with radiculopathy, lumbosacral region: Principal | ICD-10-CM | POA: Diagnosis present

## 2016-07-30 DIAGNOSIS — M5116 Intervertebral disc disorders with radiculopathy, lumbar region: Secondary | ICD-10-CM | POA: Diagnosis not present

## 2016-07-30 DIAGNOSIS — Z01818 Encounter for other preprocedural examination: Secondary | ICD-10-CM | POA: Diagnosis not present

## 2016-07-30 DIAGNOSIS — Z9841 Cataract extraction status, right eye: Secondary | ICD-10-CM

## 2016-07-30 DIAGNOSIS — N139 Obstructive and reflux uropathy, unspecified: Secondary | ICD-10-CM | POA: Diagnosis not present

## 2016-07-30 DIAGNOSIS — N400 Enlarged prostate without lower urinary tract symptoms: Secondary | ICD-10-CM | POA: Diagnosis not present

## 2016-07-30 DIAGNOSIS — Z981 Arthrodesis status: Secondary | ICD-10-CM | POA: Diagnosis not present

## 2016-07-30 DIAGNOSIS — M5416 Radiculopathy, lumbar region: Secondary | ICD-10-CM | POA: Diagnosis present

## 2016-07-30 HISTORY — PX: ANTERIOR LAT LUMBAR FUSION: SHX1168

## 2016-07-30 SURGERY — ANTERIOR LATERAL LUMBAR FUSION 1 LEVEL
Anesthesia: General | Laterality: Right

## 2016-07-30 MED ORDER — KETOROLAC TROMETHAMINE 30 MG/ML IJ SOLN: INTRAMUSCULAR | Status: DC | PRN

## 2016-07-30 MED ORDER — BUPIVACAINE LIPOSOME 1.3 % IJ SUSP
20.0000 mL | INTRAMUSCULAR | Status: AC
  Administered 2016-07-30: 20 mL
  Filled 2016-07-30: qty 20

## 2016-07-30 MED ORDER — SODIUM CHLORIDE 0.9 % IR SOLN
Status: DC | PRN
  Administered 2016-07-30: 07:00:00

## 2016-07-30 MED ORDER — HYDROMORPHONE HCL 1 MG/ML IJ SOLN
INTRAMUSCULAR | Status: AC
  Filled 2016-07-30: qty 0.5

## 2016-07-30 MED ORDER — SODIUM CHLORIDE 0.9 % IV SOLN
INTRAVENOUS | Status: DC
  Administered 2016-07-30: 11:00:00 via INTRAVENOUS

## 2016-07-30 MED ORDER — BUPIVACAINE HCL (PF) 0.5 % IJ SOLN
INTRAMUSCULAR | Status: AC
  Filled 2016-07-30: qty 30

## 2016-07-30 MED ORDER — HYDROMORPHONE HCL 1 MG/ML IJ SOLN
1.0000 mg | INTRAMUSCULAR | Status: DC | PRN
  Administered 2016-07-30 – 2016-07-31 (×2): 1 mg via INTRAVENOUS
  Filled 2016-07-30 (×2): qty 1

## 2016-07-30 MED ORDER — PROMETHAZINE HCL 25 MG/ML IJ SOLN: 6.2500 mg | INTRAMUSCULAR | Status: DC | PRN

## 2016-07-30 MED ORDER — SODIUM CHLORIDE 0.9% FLUSH
3.0000 mL | Freq: Two times a day (BID) | INTRAVENOUS | Status: DC
  Administered 2016-07-30 (×2): 3 mL via INTRAVENOUS

## 2016-07-30 MED ORDER — PHENYLEPHRINE HCL 10 MG/ML IJ SOLN
INTRAMUSCULAR | Status: DC | PRN
  Administered 2016-07-30: 80 ug via INTRAVENOUS

## 2016-07-30 MED ORDER — PROPOFOL 500 MG/50ML IV EMUL
INTRAVENOUS | Status: DC | PRN
  Administered 2016-07-30: 75 ug/kg/min via INTRAVENOUS

## 2016-07-30 MED ORDER — GLYCOPYRROLATE 0.2 MG/ML IJ SOLN
INTRAMUSCULAR | Status: DC | PRN
  Administered 2016-07-30: 0.2 mg via INTRAVENOUS

## 2016-07-30 MED ORDER — METHYLPREDNISOLONE ACETATE 80 MG/ML IJ SUSP
INTRAMUSCULAR | Status: DC | PRN
  Administered 2016-07-30: 80 mg

## 2016-07-30 MED ORDER — THROMBIN 5000 UNITS EX SOLR: OROMUCOSAL | Status: DC | PRN

## 2016-07-30 MED ORDER — THROMBIN 5000 UNITS EX SOLR
CUTANEOUS | Status: AC
  Filled 2016-07-30: qty 5000

## 2016-07-30 MED ORDER — LACTATED RINGERS IV SOLN
INTRAVENOUS | Status: DC | PRN
  Administered 2016-07-30: 07:00:00 via INTRAVENOUS

## 2016-07-30 MED ORDER — CHLORHEXIDINE GLUCONATE CLOTH 2 % EX PADS: 6.0000 | MEDICATED_PAD | Freq: Once | CUTANEOUS | Status: DC

## 2016-07-30 MED ORDER — VANCOMYCIN HCL IN DEXTROSE 1-5 GM/200ML-% IV SOLN
1000.0000 mg | INTRAVENOUS | Status: AC
  Administered 2016-07-30: 1000 mg via INTRAVENOUS
  Filled 2016-07-30: qty 200

## 2016-07-30 MED ORDER — ONDANSETRON HCL 4 MG/2ML IJ SOLN: 4.0000 mg | Freq: Four times a day (QID) | INTRAMUSCULAR | Status: DC | PRN

## 2016-07-30 MED ORDER — SENNA 8.6 MG PO TABS
1.0000 | ORAL_TABLET | Freq: Two times a day (BID) | ORAL | Status: DC
  Administered 2016-07-30 – 2016-07-31 (×2): 8.6 mg via ORAL
  Filled 2016-07-30 (×2): qty 1

## 2016-07-30 MED ORDER — THROMBIN 20000 UNITS EX SOLR
CUTANEOUS | Status: AC
  Filled 2016-07-30: qty 20000

## 2016-07-30 MED ORDER — METHOCARBAMOL 750 MG PO TABS
750.0000 mg | ORAL_TABLET | Freq: Four times a day (QID) | ORAL | Status: DC
  Administered 2016-07-30 – 2016-07-31 (×4): 750 mg via ORAL
  Filled 2016-07-30 (×4): qty 1

## 2016-07-30 MED ORDER — KETOROLAC TROMETHAMINE 30 MG/ML IJ SOLN
INTRAMUSCULAR | Status: DC | PRN
  Administered 2016-07-30: 30 mg via INTRAMUSCULAR

## 2016-07-30 MED ORDER — HYDROMORPHONE HCL 1 MG/ML IJ SOLN
0.2500 mg | INTRAMUSCULAR | Status: DC | PRN
  Administered 2016-07-30: 0.5 mg via INTRAVENOUS

## 2016-07-30 MED ORDER — SENNOSIDES-DOCUSATE SODIUM 8.6-50 MG PO TABS: 1.0000 | ORAL_TABLET | Freq: Every evening | ORAL | Status: DC | PRN

## 2016-07-30 MED ORDER — HYDROCODONE-ACETAMINOPHEN 7.5-325 MG PO TABS
1.0000 | ORAL_TABLET | Freq: Four times a day (QID) | ORAL | Status: DC
  Administered 2016-07-30 – 2016-07-31 (×4): 1 via ORAL
  Filled 2016-07-30 (×4): qty 1

## 2016-07-30 MED ORDER — DEXAMETHASONE SODIUM PHOSPHATE 10 MG/ML IJ SOLN
INTRAMUSCULAR | Status: DC | PRN
  Administered 2016-07-30: 8 mg via INTRAVENOUS

## 2016-07-30 MED ORDER — LIDOCAINE HCL (CARDIAC) 20 MG/ML IV SOLN
INTRAVENOUS | Status: DC | PRN
  Administered 2016-07-30: 100 mg via INTRATRACHEAL

## 2016-07-30 MED ORDER — PHENYLEPHRINE HCL 10 MG/ML IJ SOLN
INTRAVENOUS | Status: DC | PRN
  Administered 2016-07-30: 25 ug/min via INTRAVENOUS

## 2016-07-30 MED ORDER — 0.9 % SODIUM CHLORIDE (POUR BTL) OPTIME
TOPICAL | Status: DC | PRN
  Administered 2016-07-30: 1000 mL

## 2016-07-30 MED ORDER — PROPOFOL 10 MG/ML IV BOLUS
INTRAVENOUS | Status: DC | PRN
  Administered 2016-07-30: 200 mg via INTRAVENOUS

## 2016-07-30 MED ORDER — BUPIVACAINE HCL (PF) 0.25 % IJ SOLN
INTRAMUSCULAR | Status: AC
  Filled 2016-07-30: qty 30

## 2016-07-30 MED ORDER — OXYCODONE HCL 5 MG PO TABS
5.0000 mg | ORAL_TABLET | ORAL | Status: DC | PRN
  Administered 2016-07-31: 10 mg via ORAL
  Filled 2016-07-30: qty 2

## 2016-07-30 MED ORDER — PROPOFOL 1000 MG/100ML IV EMUL
INTRAVENOUS | Status: AC
  Filled 2016-07-30: qty 100

## 2016-07-30 MED ORDER — DOCUSATE SODIUM 100 MG PO CAPS
100.0000 mg | ORAL_CAPSULE | Freq: Two times a day (BID) | ORAL | Status: DC
  Administered 2016-07-30 – 2016-07-31 (×2): 100 mg via ORAL
  Filled 2016-07-30 (×2): qty 1

## 2016-07-30 MED ORDER — ACETAMINOPHEN 650 MG RE SUPP: 650.0000 mg | RECTAL | Status: DC | PRN

## 2016-07-30 MED ORDER — SODIUM CHLORIDE 0.9% FLUSH: 3.0000 mL | INTRAVENOUS | Status: DC | PRN

## 2016-07-30 MED ORDER — OXYCODONE HCL ER 20 MG PO T12A
20.0000 mg | EXTENDED_RELEASE_TABLET | Freq: Two times a day (BID) | ORAL | Status: DC
  Administered 2016-07-30 – 2016-07-31 (×2): 20 mg via ORAL
  Filled 2016-07-30 (×2): qty 1

## 2016-07-30 MED ORDER — MENTHOL 3 MG MT LOZG: 1.0000 | LOZENGE | OROMUCOSAL | Status: DC | PRN

## 2016-07-30 MED ORDER — OXYCODONE HCL 5 MG PO TABS
5.0000 mg | ORAL_TABLET | ORAL | Status: DC | PRN
  Administered 2016-07-30: 10 mg via ORAL
  Filled 2016-07-30: qty 2

## 2016-07-30 MED ORDER — TAMSULOSIN HCL 0.4 MG PO CAPS
0.4000 mg | ORAL_CAPSULE | Freq: Every day | ORAL | Status: DC
  Administered 2016-07-30: 0.4 mg via ORAL
  Filled 2016-07-30: qty 1

## 2016-07-30 MED ORDER — KETOROLAC TROMETHAMINE 30 MG/ML IJ SOLN
INTRAMUSCULAR | Status: AC
  Filled 2016-07-30: qty 1

## 2016-07-30 MED ORDER — CYCLOBENZAPRINE HCL 10 MG PO TABS
10.0000 mg | ORAL_TABLET | Freq: Three times a day (TID) | ORAL | Status: DC | PRN
  Administered 2016-07-30: 10 mg via ORAL

## 2016-07-30 MED ORDER — VANCOMYCIN HCL IN DEXTROSE 1-5 GM/200ML-% IV SOLN
1000.0000 mg | Freq: Once | INTRAVENOUS | Status: AC
  Administered 2016-07-30: 1000 mg via INTRAVENOUS
  Filled 2016-07-30: qty 200

## 2016-07-30 MED ORDER — BUPIVACAINE HCL (PF) 0.25 % IJ SOLN
INTRAMUSCULAR | Status: DC | PRN
  Administered 2016-07-30: 2 mL

## 2016-07-30 MED ORDER — LIDOCAINE-EPINEPHRINE 2 %-1:100000 IJ SOLN
INTRAMUSCULAR | Status: DC | PRN
  Administered 2016-07-30: 15 mL via INTRADERMAL

## 2016-07-30 MED ORDER — METHYLPREDNISOLONE ACETATE 80 MG/ML IJ SUSP
INTRAMUSCULAR | Status: AC
  Filled 2016-07-30: qty 1

## 2016-07-30 MED ORDER — MIDAZOLAM HCL 2 MG/2ML IJ SOLN
INTRAMUSCULAR | Status: AC
  Filled 2016-07-30: qty 2

## 2016-07-30 MED ORDER — ONDANSETRON HCL 4 MG/2ML IJ SOLN
INTRAMUSCULAR | Status: DC | PRN
  Administered 2016-07-30: 4 mg via INTRAVENOUS

## 2016-07-30 MED ORDER — EPHEDRINE SULFATE 50 MG/ML IJ SOLN
INTRAMUSCULAR | Status: DC | PRN
  Administered 2016-07-30: 7.5 mg via INTRAVENOUS

## 2016-07-30 MED ORDER — PENTOSAN POLYSULFATE SODIUM 100 MG PO CAPS
100.0000 mg | ORAL_CAPSULE | Freq: Three times a day (TID) | ORAL | Status: DC
  Administered 2016-07-30 – 2016-07-31 (×2): 100 mg via ORAL
  Filled 2016-07-30 (×3): qty 1

## 2016-07-30 MED ORDER — OXYCODONE-ACETAMINOPHEN 5-325 MG PO TABS: 1.0000 | ORAL_TABLET | ORAL | Status: DC | PRN

## 2016-07-30 MED ORDER — BISACODYL 10 MG RE SUPP: 10.0000 mg | Freq: Every day | RECTAL | Status: DC | PRN

## 2016-07-30 MED ORDER — MIDAZOLAM HCL 5 MG/5ML IJ SOLN
INTRAMUSCULAR | Status: DC | PRN
  Administered 2016-07-30 (×2): 1 mg via INTRAVENOUS

## 2016-07-30 MED ORDER — ACETAMINOPHEN 500 MG PO TABS
1000.0000 mg | ORAL_TABLET | Freq: Four times a day (QID) | ORAL | Status: DC
  Administered 2016-07-30 – 2016-07-31 (×4): 1000 mg via ORAL
  Filled 2016-07-30 (×4): qty 2

## 2016-07-30 MED ORDER — SODIUM CHLORIDE 0.9 % IV SOLN
INTRAVENOUS | Status: DC | PRN
  Administered 2016-07-30: 08:00:00 via INTRAVENOUS

## 2016-07-30 MED ORDER — FLEET ENEMA 7-19 GM/118ML RE ENEM: 1.0000 | ENEMA | Freq: Once | RECTAL | Status: DC | PRN

## 2016-07-30 MED ORDER — PHENOL 1.4 % MT LIQD: 1.0000 | OROMUCOSAL | Status: DC | PRN

## 2016-07-30 MED ORDER — GABAPENTIN 300 MG PO CAPS
300.0000 mg | ORAL_CAPSULE | Freq: Three times a day (TID) | ORAL | Status: DC
  Administered 2016-07-30 – 2016-07-31 (×3): 300 mg via ORAL
  Filled 2016-07-30 (×3): qty 1

## 2016-07-30 MED ORDER — THROMBIN 5000 UNITS EX SOLR
OROMUCOSAL | Status: DC | PRN
  Administered 2016-07-30: 07:00:00 via TOPICAL

## 2016-07-30 MED ORDER — FENTANYL CITRATE (PF) 250 MCG/5ML IJ SOLN
INTRAMUSCULAR | Status: AC
  Filled 2016-07-30: qty 5

## 2016-07-30 MED ORDER — SUCCINYLCHOLINE CHLORIDE 20 MG/ML IJ SOLN
INTRAMUSCULAR | Status: DC | PRN
  Administered 2016-07-30: 100 mg via INTRAVENOUS

## 2016-07-30 MED ORDER — FENTANYL CITRATE (PF) 250 MCG/5ML IJ SOLN
INTRAMUSCULAR | Status: DC | PRN
  Administered 2016-07-30: 50 ug via INTRAVENOUS
  Administered 2016-07-30: 150 ug via INTRAVENOUS
  Administered 2016-07-30: 50 ug via INTRAVENOUS

## 2016-07-30 MED ORDER — ZOLPIDEM TARTRATE 5 MG PO TABS: 5.0000 mg | ORAL_TABLET | Freq: Every evening | ORAL | Status: DC | PRN

## 2016-07-30 MED ORDER — BUPIVACAINE HCL 0.5 % IJ SOLN
INTRAMUSCULAR | Status: DC | PRN
  Administered 2016-07-30: 15 mL

## 2016-07-30 MED ORDER — ONDANSETRON HCL 4 MG PO TABS: 4.0000 mg | ORAL_TABLET | Freq: Four times a day (QID) | ORAL | Status: DC | PRN

## 2016-07-30 MED ORDER — ACETAMINOPHEN 325 MG PO TABS: 650.0000 mg | ORAL_TABLET | ORAL | Status: DC | PRN

## 2016-07-30 MED ORDER — THROMBIN 20000 UNITS EX SOLR
CUTANEOUS | Status: DC | PRN
  Administered 2016-07-30: 07:00:00 via TOPICAL

## 2016-07-30 MED ORDER — LIDOCAINE-EPINEPHRINE (PF) 2 %-1:200000 IJ SOLN
INTRAMUSCULAR | Status: AC
  Filled 2016-07-30: qty 20

## 2016-07-30 MED ORDER — PROPOFOL 10 MG/ML IV BOLUS
INTRAVENOUS | Status: AC
  Filled 2016-07-30: qty 20

## 2016-07-30 SURGICAL SUPPLY — 79 items
ADH SKN CLS APL DERMABOND .7 (GAUZE/BANDAGES/DRESSINGS) ×2
BATTALION LLIF ITRADISCAL SHIM (MISCELLANEOUS) ×2
BLADE CLIPPER SURG (BLADE)
CABLE BATTALION LLIF LIGHT (MISCELLANEOUS) ×2
CHLORAPREP W/TINT 26ML (MISCELLANEOUS) ×2
COVER BACK TABLE 24X17X13 BIG (DRAPES)
DECANTER SPIKE VIAL GLASS SM (MISCELLANEOUS) ×2
DERMABOND ADVANCED (GAUZE/BANDAGES/DRESSINGS) ×2
DERMABOND ADVANCED .7 DNX12 (GAUZE/BANDAGES/DRESSINGS) ×2
DIFFUSER DRILL AIR PNEUMATIC (MISCELLANEOUS) ×2
DILATOR INSULATED XL 8X13 (MISCELLANEOUS) ×2
DISSECTOR BLUNT TIP ENDO 5MM (MISCELLANEOUS) ×2
DRAPE C-ARM 42X72 X-RAY (DRAPES) ×4
DRAPE C-ARMOR (DRAPES) ×2
DRAPE LAPAROTOMY 100X72X124 (DRAPES) ×2
DRAPE MICROSCOPE LEICA (MISCELLANEOUS) ×2
DRAPE POUCH INSTRU U-SHP 10X18 (DRAPES) ×2
DRSG OPSITE POSTOP 4X6 (GAUZE/BANDAGES/DRESSINGS) ×4
ELECT BLADE 4.0 EZ CLEAN MEGAD (MISCELLANEOUS) ×2
ELECT REM PT RETURN 9FT ADLT (ELECTROSURGICAL) ×2
GAUZE SPONGE 4X4 16PLY XRAY LF (GAUZE/BANDAGES/DRESSINGS)
GLOVE BIO SURGEON STRL SZ7 (GLOVE) ×2
GLOVE BIOGEL PI IND STRL 7.0 (GLOVE)
GLOVE BIOGEL PI IND STRL 7.5 (GLOVE) ×2
GLOVE BIOGEL PI INDICATOR 7.0 (GLOVE)
GLOVE BIOGEL PI INDICATOR 7.5 (GLOVE) ×2
GLOVE ECLIPSE 7.0 STRL STRAW (GLOVE) ×4
GLOVE ECLIPSE 9.0 STRL (GLOVE) ×2
GLOVE EXAM NITRILE LRG STRL (GLOVE)
GLOVE EXAM NITRILE XL STR (GLOVE)
GLOVE EXAM NITRILE XS STR PU (GLOVE)
GLOVE INDICATOR 7.5 STRL GRN (GLOVE) ×10
GLOVE SS BIOGEL STRL SZ 7.5 (GLOVE) ×4
GLOVE SUPERSENSE BIOGEL SZ 7.5 (GLOVE) ×4
GLOVE SURG SS PI 7.0 STRL IVOR (GLOVE) ×2
GOWN STRL REUS W/ TWL LRG LVL3 (GOWN DISPOSABLE) ×1
GOWN STRL REUS W/ TWL XL LVL3 (GOWN DISPOSABLE) ×1
GOWN STRL REUS W/TWL 2XL LVL3 (GOWN DISPOSABLE)
GOWN STRL REUS W/TWL LRG LVL3 (GOWN DISPOSABLE) ×2
GOWN STRL REUS W/TWL XL LVL3 (GOWN DISPOSABLE) ×2
GUIDEWIRE 320MM BLUNT TIP (WIRE) ×2
HEMOSTAT POWDER KIT SURGIFOAM (HEMOSTASIS)
INTRAOP MONITOR FEE IMPULS NCS (MISCELLANEOUS) ×1
INTRAOP MONITOR FEE IMPULSE (MISCELLANEOUS) ×1
KIT BASIN OR (CUSTOM PROCEDURE TRAY) ×2
KIT INFUSE X SMALL 1.4CC (Orthopedic Implant) ×2 IMPLANT
KIT ROOM TURNOVER OR (KITS) ×2
LEAD WIRE MULTI STAGE DISP (NEUROSURGERY SUPPLIES) ×2
MARKER SKIN DUAL TIP RULER LAB (MISCELLANEOUS) ×2
NEEDLE HYPO 21X1 ECLIPSE (NEEDLE) ×2
NEEDLE HYPO 21X1.5 SAFETY (NEEDLE) ×4
NEEDLE HYPO 25X1 1.5 SAFETY (NEEDLE) ×2
NS IRRIG 1000ML POUR BTL (IV SOLUTION) ×2
OIL CARTRIDGE MAESTRO DRILL (MISCELLANEOUS) ×2
PACK LAMINECTOMY NEURO (CUSTOM PROCEDURE TRAY) ×2
PLATE LATERAL CAYMAN 14MM (Plate) ×2 IMPLANT
PROBE BALL TIP STIM 200MM 2.3M (NEUROSURGERY SUPPLIES) ×2
PUTTY DBM GRAFTON 5CC (Putty) ×2 IMPLANT
RUBBERBAND STERILE (MISCELLANEOUS) ×4
SCREW 5.0X48MM CAYMAN MI (Screw) ×4 IMPLANT
SCREW CAYMAN 5X44 (Screw) ×4 IMPLANT
SPACER LORD BATT 10D 22X10X50 (Spacer) ×2 IMPLANT
SPONGE LAP 4X18 X RAY DECT (DISPOSABLE)
STAPLER VISISTAT 35W (STAPLE) ×2
SUT MNCRL AB 3-0 PS2 18 (SUTURE) ×2
SUT STRATAFIX MNCRL+ 3-0 PS-2 (SUTURE) ×1
SUT STRATAFIX MONOCRYL 3-0 (SUTURE) ×1
SUT VIC AB 0 CT1 18XCR BRD8 (SUTURE) ×1
SUT VIC AB 0 CT1 8-18 (SUTURE) ×2
SUT VIC AB 1 CT1 18XBRD ANBCTR (SUTURE) ×1
SUT VIC AB 1 CT1 8-18 (SUTURE) ×2
SUT VIC AB 2-0 CT1 18 (SUTURE) ×4
SUT VIC AB 3-0 SH 8-18 (SUTURE)
SYR 30ML LL (SYRINGE) ×2
SYR 5ML LL (SYRINGE) ×2
TOWEL GREEN STERILE (TOWEL DISPOSABLE) ×2
TOWEL GREEN STERILE FF (TOWEL DISPOSABLE) ×2
TRAY FOLEY W/METER SILVER 16FR (SET/KITS/TRAYS/PACK) ×2
WATER STERILE IRR 1000ML POUR (IV SOLUTION) ×2

## 2016-07-30 NOTE — Transfer of Care (Signed)
Immediate Anesthesia Transfer of Care Note  Patient: Rodney Hall.  Procedure(s) Performed: Procedure(s): Right Lateral two-three Lateral transpsoas interbody fusion with lateral plating/Minimally invasive decompression at Lumbar two-three (Right)  Patient Location: PACU  Anesthesia Type:General  Level of Consciousness: awake, alert  and oriented  Airway & Oxygen Therapy: Patient Spontanous Breathing and Patient connected to nasal cannula oxygen  Post-op Assessment: Report given to RN and Post -op Vital signs reviewed and stable  Post vital signs: Reviewed and stable  Last Vitals:  Vitals:   07/30/16 0642  BP: 128/72  Pulse: 78  Resp: 18  Temp: 36.9 C    Last Pain:  Vitals:   07/30/16 0642  TempSrc: Oral      Patients Stated Pain Goal: 2 (81/82/99 3716)  Complications: No apparent anesthesia complications

## 2016-07-30 NOTE — Evaluation (Signed)
Physical Therapy Evaluation Patient Details Name: Rodney Hall. MRN: [id number] DOB: 09-13-1945 Today's Date: 07/30/2016   History of Present Illness  Pt is 71 y/o male s/p L2-3 Lateral transposas interbody fusion. PMH includes anxiety, arthritis, and neck surgery.   Clinical Impression  Pt is s/p surgery above with deficits below. PTA, pt was independent with ambulation. Upon evaluation, pt limited significantly by pain. Also noted R knee buckling and R foot drag during ambulation, which pt reports is new onset. Pt requiring mod to min guard assist with functional mobility tasks. Pt reports wife can assist at d/c as needed, and will need a RW at d/c. Recommending OT consult as well to ensure safety with ADL tasks at home. Will continue to follow acutely to maximize functional mobility independence.      Follow Up Recommendations No PT follow up;Supervision/Assistance - 24 hour    Equipment Recommendations  Rolling walker with 5" wheels    Recommendations for Other Services OT consult     Precautions / Restrictions Precautions Precautions: Back Precaution Booklet Issued: Yes (comment) Precaution Comments: Reviewed back precautions with pt and family Required Braces or Orthoses: Spinal Brace Spinal Brace: Lumbar corset Restrictions Weight Bearing Restrictions: No      Mobility  Bed Mobility Overal bed mobility: Needs Assistance Bed Mobility: Rolling;Sit to Sidelying Rolling: Min guard       Sit to sidelying: Min assist General bed mobility comments: Min A for trunk descent into sidelying. Assit for LE elevation. Verbal cues for log roll technique.   Transfers Overall transfer level: Needs assistance Equipment used: Rolling walker (2 wheeled) Transfers: Sit to/from Stand Sit to Stand: Mod assist         General transfer comment: Verbal cues for hand placement. Required mod A for lift assist secondary to pain and weakness in RLE.    Ambulation/Gait Ambulation/Gait assistance: Min guard Ambulation Distance (Feet): 30 Feet Assistive device: Rolling walker (2 wheeled) Gait Pattern/deviations: Step-through pattern;Decreased step length - right (R foot drop; slight buckling ) Gait velocity: Decreased Gait velocity interpretation: Below normal speed for age/gender General Gait Details: Slow, guarded gait. Noted slight buckling in R knee and R foot drag during ambulation. Verbal cues for appropriate step height on RLE. Verbal cues for sequencing using RW. Distance limited by pain.   Stairs            Wheelchair Mobility    Modified Rankin (Stroke Patients Only)       Balance Overall balance assessment: Needs assistance Sitting-balance support: No upper extremity supported;Feet supported Sitting balance-Leahy Scale: Good     Standing balance support: Bilateral upper extremity supported;During functional activity Standing balance-Leahy Scale: Poor Standing balance comment: Reliant on RW for stability                              Pertinent Vitals/Pain Pain Assessment: Faces Faces Pain Scale: Hurts whole lot Pain Location: back and R leg Pain Descriptors / Indicators: Sharp;Grimacing;Operative site guarding Pain Intervention(s): Limited activity within patient's tolerance;Monitored during session;Repositioned;Premedicated before session    Home Living Family/patient expects to be discharged to:: Private residence Living Arrangements: Spouse/significant other Available Help at Discharge: Family;Available 24 hours/day Type of Home: House Home Access: Stairs to enter Entrance Stairs-Rails: Right Entrance Stairs-Number of Steps: 3 Home Layout: One level Home Equipment: Cane - single point;Shower seat - built in      Prior Function Level of Independence: Independent  Hand Dominance        Extremity/Trunk Assessment   Upper Extremity Assessment Upper Extremity  Assessment: Overall WFL for tasks assessed    Lower Extremity Assessment Lower Extremity Assessment: RLE deficits/detail RLE Deficits / Details: Sharp pain and buckling noted in RLE. Foot drop also noted.     Cervical / Trunk Assessment Cervical / Trunk Assessment: Other exceptions Cervical / Trunk Exceptions: s/p back surgery   Communication   Communication: No difficulties  Cognition Arousal/Alertness: Awake/alert Behavior During Therapy: WFL for tasks assessed/performed Overall Cognitive Status: Within Functional Limits for tasks assessed                                        General Comments General comments (skin integrity, edema, etc.): Pt reporting significant pain in RLE that was not there prior to surgery. Also noted R foot drag and slight R knee buckling that was not previously there. Wife present throughout session. Educated about generalized walking program.     Exercises     Assessment/Plan    PT Assessment Patient needs continued PT services  PT Problem List Decreased strength;Decreased activity tolerance;Decreased balance;Decreased mobility;Decreased knowledge of use of DME;Decreased knowledge of precautions;Pain       PT Treatment Interventions DME instruction;Gait training;Stair training;Functional mobility training;Therapeutic activities;Therapeutic exercise;Balance training;Neuromuscular re-education;Patient/family education    PT Goals (Current goals can be found in the Care Plan section)  Acute Rehab PT Goals Patient Stated Goal: to decrease pain  PT Goal Formulation: With patient Time For Goal Achievement: 08/06/16 Potential to Achieve Goals: Good    Frequency Min 5X/week   Barriers to discharge        Co-evaluation               AM-PAC PT "6 Clicks" Daily Activity  Outcome Measure Difficulty turning over in bed (including adjusting bedclothes, sheets and blankets)?: Total Difficulty moving from lying on back to sitting  on the side of the bed? : Total Difficulty sitting down on and standing up from a chair with arms (e.g., wheelchair, bedside commode, etc,.)?: Total Help needed moving to and from a bed to chair (including a wheelchair)?: A Little Help needed walking in hospital room?: A Little Help needed climbing 3-5 steps with a railing? : A Lot 6 Click Score: 11    End of Session Equipment Utilized During Treatment: Gait belt;Back brace Activity Tolerance: Patient limited by pain Patient left: in bed;with call bell/phone within reach;with family/visitor present Nurse Communication: Mobility status PT Visit Diagnosis: Other symptoms and signs involving the nervous system (R29.898);Other abnormalities of gait and mobility (R26.89);Pain Pain - part of body:  (back )    Time: 0865-7846 PT Time Calculation (min) (ACUTE ONLY): 17 min   Charges:   PT Evaluation $PT Eval Moderate Complexity: 1 Procedure     PT G Codes:        Nicky Pugh, PT, DPT  Acute Rehabilitation Services  Pager: [phone]   Army Melia 07/30/2016, 4:32 PM

## 2016-07-30 NOTE — Brief Op Note (Signed)
07/30/2016  10:32 AM  PATIENT:  Rodney Hall.  71 y.o. male  PRE-OPERATIVE DIAGNOSIS:  Other spondylosis with radiculopathy, Lumbosacral region  POST-OPERATIVE DIAGNOSIS:  Other spondylosis with radiculopathy, Lumbosacral region  PROCEDURE:  Procedure(s): Right Lateral two-three Lateral transpsoas interbody fusion with lateral plating/Minimally invasive decompression at Lumbar two-three (Right)  SURGEON:  Surgeon(s) and Role:    * Ditty, Kevan Ny, MD - Primary  PHYSICIAN ASSISTANT: Ferne Reus, PA-C  ANESTHESIA:   general  EBL:  Total I/O In: 1500 [I.V.:1500] Out: 325 [Urine:225; Blood:100]  BLOOD ADMINISTERED:none  DRAINS: none   LOCAL MEDICATIONS USED:  BUPIVICAINE  and LIDOCAINE   SPECIMEN:  No Specimen  DISPOSITION OF SPECIMEN:  N/A  COUNTS:  YES  TOURNIQUET:  * No tourniquets in log *  DICTATION: .Note written in EPIC  PLAN OF CARE: Admit to inpatient ; potentially discharge home later today  PATIENT DISPOSITION:  PACU - hemodynamically stable.   Delay start of Pharmacological VTE agent (>24hrs) due to surgical blood loss or risk of bleeding: yes

## 2016-07-30 NOTE — Anesthesia Preprocedure Evaluation (Addendum)
Anesthesia Evaluation  Patient identified by MRN, date of birth, ID band Patient awake    Reviewed: Allergy & Precautions, NPO status , Patient's Chart, lab work & pertinent test results  History of Anesthesia Complications Negative for: history of anesthetic complications  Airway Mallampati: II  TM Distance: >3 FB Neck ROM: Full    Dental  (+) Teeth Intact, Dental Advisory Given   Pulmonary neg pulmonary ROS,    breath sounds clear to auscultation       Cardiovascular negative cardio ROS   Rhythm:Regular Rate:Normal     Neuro/Psych negative neurological ROS     GI/Hepatic negative GI ROS, Neg liver ROS,   Endo/Other  negative endocrine ROS  Renal/GU negative Renal ROS     Musculoskeletal   Abdominal   Peds  Hematology   Anesthesia Other Findings   Reproductive/Obstetrics                            Anesthesia Physical Anesthesia Plan  ASA: II  Anesthesia Plan: General   Post-op Pain Management:    Induction: Intravenous  Airway Management Planned: Oral ETT  Additional Equipment:   Intra-op Plan:   Post-operative Plan: Extubation in OR  Informed Consent: I have reviewed the patients History and Physical, chart, labs and discussed the procedure including the risks, benefits and alternatives for the proposed anesthesia with the patient or authorized representative who has indicated his/her understanding and acceptance.   Dental advisory given  Plan Discussed with:   Anesthesia Plan Comments:         Anesthesia Quick Evaluation

## 2016-07-30 NOTE — Anesthesia Procedure Notes (Signed)
Procedure Name: Intubation Date/Time: 07/30/2016 7:43 AM Performed by: Mariea Clonts Pre-anesthesia Checklist: Patient identified, Emergency Drugs available, Suction available and Patient being monitored Patient Re-evaluated:Patient Re-evaluated prior to inductionOxygen Delivery Method: Circle System Utilized Preoxygenation: Pre-oxygenation with 100% oxygen Intubation Type: IV induction Ventilation: Mask ventilation without difficulty Laryngoscope Size: Miller and 2 Grade View: Grade I Tube type: Oral Tube size: 7.5 mm Number of attempts: 1 Airway Equipment and Method: Stylet and Oral airway Placement Confirmation: ETT inserted through vocal cords under direct vision,  positive ETCO2 and breath sounds checked- equal and bilateral Tube secured with: Tape Dental Injury: Teeth and Oropharynx as per pre-operative assessment

## 2016-07-30 NOTE — Anesthesia Postprocedure Evaluation (Signed)
Anesthesia Post Note  Patient: Chandra Feger.  Procedure(s) Performed: Procedure(s) (LRB): Right Lateral two-three Lateral transpsoas interbody fusion with lateral plating/Minimally invasive decompression at Lumbar two-three (Right)  Patient location during evaluation: PACU Anesthesia Type: General Level of consciousness: awake, oriented and sedated Pain management: pain level controlled Vital Signs Assessment: post-procedure vital signs reviewed and stable Respiratory status: spontaneous breathing, nonlabored ventilation, respiratory function stable and patient connected to nasal cannula oxygen Cardiovascular status: blood pressure returned to baseline and stable Postop Assessment: no signs of nausea or vomiting Anesthetic complications: no       Last Vitals:  Vitals:   07/30/16 1150 07/30/16 1226  BP: 123/75 (!) 149/83  Pulse: 79 85  Resp: 18 18  Temp: 36.5 C 36.5 C    Last Pain:  Vitals:   07/30/16 1324  TempSrc:   PainSc: 6                  Jamee Keach,JAMES TERRILL

## 2016-07-30 NOTE — H&P (Signed)
CC:  No chief complaint on file. Back pain radiating into hips  HPI: Rodney Hall. is a 71 y.o. male with lumbosacral spondylosis and stenosis above a prior fusion.  He presents for elective fusion and decompression.  PMH: Past Medical History:  Diagnosis Date  . Anxiety   . Arthritis   . History of actinic keratoses   . Hyperglycemia     PSH: Past Surgical History:  Procedure Laterality Date  . BACK SURGERY  2012  . CATARACT EXTRACTION, BILATERAL    . COLONOSCOPY W/ POLYPECTOMY    . NECK SURGERY  2000  . TONSILLECTOMY AND ADENOIDECTOMY      SH: Social History  Substance Use Topics  . Smoking status: Never Smoker  . Smokeless tobacco: Never Used  . Alcohol use No    MEDS: Prior to Admission medications   Medication Sig Start Date End Date Taking? Authorizing Provider  imipramine (TOFRANIL) 50 MG tablet Take 50 mg by mouth at bedtime.    Yes [provider]  naproxen sodium (ANAPROX) 220 MG tablet Take 220 mg by mouth 2 (two) times daily with a meal.   Yes [provider]  pentosan polysulfate (ELMIRON) 100 MG capsule Take 100 mg by mouth 3 (three) times daily.    Yes [provider]  tamsulosin (FLOMAX) 0.4 MG CAPS capsule Take 0.4 mg by mouth daily after supper.    Yes [provider]    ALLERGY: Allergies  Allergen Reactions  . Amoxicillin Rash    Has patient had a PCN reaction causing immediate rash, facial/tongue/throat swelling, SOB or lightheadedness with hypotension: Yes Has patient had a PCN reaction causing severe rash involving mucus membranes or skin necrosis: No Has patient had a PCN reaction that required hospitalization No Has patient had a PCN reaction occurring within the last 10 years: No If all of the above answers are "NO", then may proceed with Cephalosporin use.      ROS: ROS  NEUROLOGIC EXAM: Awake, alert, oriented Memory and concentration grossly intact Speech fluent, appropriate CN grossly  intact Motor exam: Upper Extremities Deltoid Bicep Tricep Grip  Right 5/5 5/5 5/5 5/5  Left 5/5 5/5 5/5 5/5   Lower Extremity IP Quad PF DF EHL  Right 5/5 5/5 5/5 5/5 5/5  Left 5/5 5/5 5/5 5/5 5/5   Sensation grossly intact to LT  IMAGING: No new imaging  IMPRESSION: - 71 y.o. male with lumbosacral spondylosis with radiculopathy.  He is neurologically intact.  PLAN: - L2-3 transpsoas lateral interbody fusion with plating; minimaly invasive L2-3 decompression - We have reviewed the risks, benefits, and alternatives and he wishes to proceed.

## 2016-07-30 NOTE — Discharge Summary (Signed)
Physician Discharge Summary  Patient ID: Rodney Hall. MRN: [id number] DOB/AGE: 08-Jun-1945 71 y.o.  Admit date: 07/30/2016 Discharge date: 07/31/2016  Admission Diagnoses:  Lumbar radiculopathy   Discharge Diagnoses:  Same Active Problems:   Lumbar radiculopathy   Discharged Condition: Stable  Hospital Course:  Rodney Hall. is a 71 y.o. male who was admitted for the below procedure. There were no post operative complications. At time of discharge, pain was well controlled, ambulating in halls, tolerating po, voiding normal. Ready for discharge.  Treatments: Surgery - Right Lateral two-three Lateral transpsoas interbody fusion with lateral plating/Minimally invasive decompression at Lumbar two-three (Right)  Discharge Exam: Blood pressure 122/71, pulse 86, temperature 98.2 F (36.8 C), resp. rate 18, height 5' 11.5" (1.816 m), weight 89.7 kg (197 lb 12.8 oz), SpO2 93 %. Awake, alert, oriented Speech fluent, appropriate CN grossly intact MAEW. Strength appropriate. Wound c/d/i  Disposition:   Discharge Instructions    Call MD for:  difficulty breathing, headache or visual disturbances    Complete by:  As directed    Call MD for:  persistant dizziness or light-headedness    Complete by:  As directed    Call MD for:  redness, tenderness, or signs of infection (pain, swelling, redness, odor or green/yellow discharge around incision site)    Complete by:  As directed    Call MD for:  severe uncontrolled pain    Complete by:  As directed    Call MD for:  temperature >100.4    Complete by:  As directed    Diet general    Complete by:  As directed    Driving Restrictions    Complete by:  As directed    Do not drive until given clearance.   Incentive spirometry RT    Complete by:  As directed    Increase activity slowly    Complete by:  As directed    Lifting restrictions    Complete by:  As directed    Do not lift anything >10lbs. Avoid bending and twisting  in awkward positions. Avoid bending at the back.   May shower / Bathe    Complete by:  As directed    In 24 hours. Okay to wash wound with warm soapy water. Avoid scrubbing the wound. Pat dry.   Remove dressing in 24 hours    Complete by:  As directed      Allergies as of 07/31/2016      Reactions   Amoxicillin Rash   Has patient had a PCN reaction causing immediate rash, facial/tongue/throat swelling, SOB or lightheadedness with hypotension: Yes Has patient had a PCN reaction causing severe rash involving mucus membranes or skin necrosis: No Has patient had a PCN reaction that required hospitalization No Has patient had a PCN reaction occurring within the last 10 years: No If all of the above answers are "NO", then may proceed with Cephalosporin use.      Medication List    TAKE these medications   cyclobenzaprine 10 MG tablet Commonly known as:  FLEXERIL Take 1 tablet (10 mg total) by mouth 3 (three) times daily as needed for muscle spasms.   ELMIRON 100 MG capsule Generic drug:  pentosan polysulfate Take 100 mg by mouth 3 (three) times daily.   FLOMAX 0.4 MG Caps capsule Generic drug:  tamsulosin Take 0.4 mg by mouth daily after supper.   gabapentin 300 MG capsule Commonly known as:  NEURONTIN Take 1 capsule (300 mg total)  by mouth 3 (three) times daily.   imipramine 50 MG tablet Commonly known as:  TOFRANIL Take 50 mg by mouth at bedtime.   naproxen sodium 220 MG tablet Commonly known as:  ANAPROX Take 220 mg by mouth 2 (two) times daily with a meal.   Oxycodone HCl 10 MG Tabs Take 1 tablet (10 mg total) by mouth every 4 (four) hours as needed.      Follow-up Information    Ditty, Loura Halt, MD. Schedule an appointment as soon as possible for a visit in 3 week(s).   Specialty:  Neurosurgery Why:  First post op visit Contact information: [address] [employer] [address] [place] [address] [phone]           Signed: Alyson Ingles 07/31/2016,  8:29 AM

## 2016-07-31 LAB — PROTIME-INR
INR: 1.1
Prothrombin Time: 14.3 s (ref 11.4–15.2)

## 2016-07-31 LAB — BASIC METABOLIC PANEL WITH GFR
Anion gap: 6 (ref 5–15)
BUN: 12 mg/dL (ref 6–20)
CO2: 26 mmol/L (ref 22–32)
Calcium: 8.8 mg/dL — ABNORMAL LOW (ref 8.9–10.3)
Chloride: 103 mmol/L (ref 101–111)
Creatinine, Ser: 1.33 mg/dL — ABNORMAL HIGH (ref 0.61–1.24)
GFR calc Af Amer: >60 mL/min
GFR calc non Af Amer: 53 mL/min — ABNORMAL LOW
Glucose, Bld: 123 mg/dL — ABNORMAL HIGH (ref 65–99)
Potassium: 3.9 mmol/L (ref 3.5–5.1)
Sodium: 135 mmol/L (ref 135–145)

## 2016-07-31 LAB — CBC
HCT: 42.8 % (ref 39.0–52.0)
Hemoglobin: 14 g/dL (ref 13.0–17.0)
MCH: 30.1 pg (ref 26.0–34.0)
MCHC: 32.7 g/dL (ref 30.0–36.0)
MCV: 92 fL (ref 78.0–100.0)
Platelets: 208 10*3/uL (ref 150–400)
RBC: 4.65 MIL/uL (ref 4.22–5.81)
RDW: 12.6 % (ref 11.5–15.5)
WBC: 14.3 10*3/uL — ABNORMAL HIGH (ref 4.0–10.5)

## 2016-07-31 LAB — APTT: aPTT: 37 s — ABNORMAL HIGH (ref 24–36)

## 2016-07-31 MED ORDER — CYCLOBENZAPRINE HCL 10 MG PO TABS: 10.0000 mg | ORAL_TABLET | Freq: Three times a day (TID) | ORAL | 0 refills | Status: DC | PRN

## 2016-07-31 MED ORDER — GABAPENTIN 300 MG PO CAPS: 300.0000 mg | ORAL_CAPSULE | Freq: Three times a day (TID) | ORAL | 2 refills | Status: DC

## 2016-07-31 MED ORDER — OXYCODONE HCL 10 MG PO TABS: 10.0000 mg | ORAL_TABLET | ORAL | 0 refills | Status: DC | PRN

## 2016-07-31 NOTE — Progress Notes (Signed)
Doing well Feels much better than yesterday Full strength Ambulating well

## 2016-07-31 NOTE — Evaluation (Signed)
Occupational Therapy Evaluation and Discharge Patient Details Name: Rodney Hall. MRN: [id number] DOB: 11-Oct-1945 Today's Date: 07/31/2016    History of Present Illness Pt is 71 y/o male s/p L2-3 Lateral transposas interbody fusion. PMH includes anxiety, arthritis, and neck surgery.    Clinical Impression   PTA Pt independent in ADL/IADL and mobility. Very active, driving etc. Pt currently min A for LB ADL (wife willing and able to assist) and supervision for mobility with RW. Back handout provided and reviewed adls in detail. Pt educated on: clothing between brace, never sleep in brace, set an alarm at night for medication, avoid sitting for long periods of time, correct bed positioning for sleeping, correct sequence for bed mobility, avoiding lifting more than 5 pounds and never wash directly over incision. All education is complete and patient indicates understanding. Pt and wife had no questions at the end of session, OT to sign off at this time. Thank you for this referral.     Follow Up Recommendations  No OT follow up;Supervision - Intermittent    Equipment Recommendations  None recommended by OT    Recommendations for Other Services       Precautions / Restrictions Precautions Precautions: Back Precaution Booklet Issued: Yes (comment) Precaution Comments: Reviewed back precautions with pt and family Required Braces or Orthoses: Spinal Brace Spinal Brace: Lumbar corset Restrictions Weight Bearing Restrictions: No      Mobility Bed Mobility Overal bed mobility: Needs Assistance Bed Mobility: Rolling;Sit to Sidelying Rolling: Modified independent (Device/Increase time) Sidelying to sit: Modified independent (Device/Increase time)       General bed mobility comments: vc for technique for log roll  Transfers Overall transfer level: Needs assistance Equipment used: Rolling walker (2 wheeled) Transfers: Sit to/from Stand Sit to Stand: Modified independent  (Device/Increase time)         General transfer comment: Verbal cues for hand placement with use of RW (SPC not in room)    Balance Overall balance assessment: Needs assistance Sitting-balance support: No upper extremity supported;Feet supported Sitting balance-Leahy Scale: Good     Standing balance support: Bilateral upper extremity supported;During functional activity Standing balance-Leahy Scale: Poor Standing balance comment: At least 1 UE support is required for safety                           ADL either performed or assessed with clinical judgement   ADL Overall ADL's : Needs assistance/impaired Eating/Feeding: Modified independent   Grooming: Supervision/safety;Standing Grooming Details (indicate cue type and reason): cueing for back precautions, and compensatory strategies for oral care and prevent twisting applied Upper Body Bathing: Supervision/ safety   Lower Body Bathing: Minimal assistance;With caregiver independent assisting;With adaptive equipment;Sit to/from stand Lower Body Bathing Details (indicate cue type and reason): educated on long handle sponge Upper Body Dressing : Supervision/safety;With caregiver independent assisting;Sitting Upper Body Dressing Details (indicate cue type and reason): ton don/doff brace, Pt fully dressed when OT entered the room Lower Body Dressing: Supervision/safety;With caregiver independent assisting;Cueing for safety;Sitting/lateral leans Lower Body Dressing Details (indicate cue type and reason): Pt able to adjust socks by bringing foot crossing to knee. Toilet Transfer: Supervision/safety;Ambulation;RW Toilet Transfer Details (indicate cue type and reason): edcuated on precautions for flushing and rear peri care Toileting- Clothing Manipulation and Hygiene: Supervision/safety;Cueing for back precautions;Sit to/from stand   Tub/ Shower Transfer: Walk-in shower;Supervision/safety;Ambulation;Rolling walker;Shower seat    Functional mobility during ADLs: Supervision/safety;Rolling walker       Vision Patient  Visual Report: No change from baseline Vision Assessment?: No apparent visual deficits     Perception     Praxis      Pertinent Vitals/Pain Pain Assessment: 0-10 Pain Score: 5  Faces Pain Scale: Hurts whole lot Pain Location: back Pain Descriptors / Indicators: Operative site guarding;Sore Pain Intervention(s): Limited activity within patient's tolerance;Monitored during session;Repositioned     Hand Dominance Right   Extremity/Trunk Assessment Upper Extremity Assessment Upper Extremity Assessment: Overall WFL for tasks assessed   Lower Extremity Assessment Lower Extremity Assessment: Overall WFL for tasks assessed   Cervical / Trunk Assessment Cervical / Trunk Assessment: Other exceptions Cervical / Trunk Exceptions: s/p back surgery    Communication Communication Communication: No difficulties   Cognition Arousal/Alertness: Awake/alert Behavior During Therapy: WFL for tasks assessed/performed Overall Cognitive Status: Within Functional Limits for tasks assessed                                     General Comments  Wife present for all education/session    Exercises     Shoulder Instructions      Home Living Family/patient expects to be discharged to:: Private residence Living Arrangements: Spouse/significant other Available Help at Discharge: Family;Available 24 hours/day Type of Home: House Home Access: Stairs to enter [employer] of Steps: 3 Entrance Stairs-Rails: Right Home Layout: One level     Bathroom Shower/Tub: Occupational psychologist: Handicapped height Bathroom Accessibility: Yes How Accessible: Accessible via walker Home Equipment: [place] - single point;Shower seat - built in   Additional Comments: educated on benefits of long handle sponge and toilet aide      Prior Functioning/Environment Level of  Independence: Independent        Comments: very active, drives, spin class fanatic        OT Problem List: Decreased range of motion;Decreased activity tolerance;Impaired balance (sitting and/or standing);Decreased safety awareness;Decreased knowledge of use of DME or AE;Pain      OT Treatment/Interventions:      OT Goals(Current goals can be found in the care plan section) Acute Rehab OT Goals Patient Stated Goal: To get back to spin class OT Goal Formulation: With patient/family Time For Goal Achievement: 08/14/16 Potential to Achieve Goals: Good  OT Frequency:     Barriers to D/C:            Co-evaluation              AM-PAC PT "6 Clicks" Daily Activity     Outcome Measure Help from another person eating meals?: None Help from another person taking care of personal grooming?: None Help from another person toileting, which includes using toliet, bedpan, or urinal?: A Little Help from another person bathing (including washing, rinsing, drying)?: A Little Help from another person to put on and taking off regular upper body clothing?: None Help from another person to put on and taking off regular lower body clothing?: A Little 6 Click Score: 21   End of Session Equipment Utilized During Treatment: Back brace;Rolling walker Nurse Communication: Mobility status;Precautions  Activity Tolerance: Patient tolerated treatment well Patient left: in bed;with call bell/phone within reach;with family/visitor present  OT Visit Diagnosis: Other abnormalities of gait and mobility (R26.89);Pain Pain - Right/Left: Right Pain - part of body: Leg (back)                Time: 9562-1308 OT Time Calculation (min): 20 min Charges:  OT General Charges $OT Visit: 1 Procedure OT Evaluation $OT Eval Moderate Complexity: 1 Procedure G-Codes:     Hulda Humphrey OTR/L [place] 07/31/2016, 11:22 AM

## 2016-07-31 NOTE — Progress Notes (Signed)
Physical Therapy Treatment Patient Details Name: Rodney Hall. MRN: [id number] DOB: [birth year] Today's Date: 07/31/2016    History of Present Illness Pt is 71 y/o male s/p L2-3 Lateral transposas interbody fusion. PMH includes anxiety, arthritis, and neck surgery.     PT Comments    Pt progressing well with mobility. Continues to have slight RLE weakness but no noted foot drop or knee buckling noted this session with ambulation. Pt completed stair training, and was educated on car transfer, precautions, brace application/wearing schedule, walking program, and general safety with mobility. Pt is currently functioning at a mod I level and no longer required acute PT services. Will sign off at this time. If needs change, please reconsult.  Follow Up Recommendations  No PT follow up;Supervision/Assistance - 24 hour     Equipment Recommendations  Rolling walker with 5" wheels    Recommendations for Other Services       Precautions / Restrictions Precautions Precautions: Back Precaution Booklet Issued: Yes (comment) Precaution Comments: Pt was cued for precautions during functional mobility.  Required Braces or Orthoses: Spinal Brace Spinal Brace: Lumbar corset Restrictions Weight Bearing Restrictions: No    Mobility  Bed Mobility Overal bed mobility: Modified Independent Bed Mobility: Rolling;Sidelying to Sit Rolling: Modified independent (Device/Increase time) Sidelying to sit: Modified independent (Device/Increase time)       General bed mobility comments: VC's for minor technique changes during log roll. Overall performed well and without assist.   Transfers Overall transfer level: Modified independent Equipment used: Rolling walker (2 wheeled) Transfers: Sit to/from Stand Sit to Stand: Modified independent (Device/Increase time)         General transfer comment: Pt demonstrated proper hand placement and safety awareness with sit<>stand. At end of session used  SPC during stand>sit without difficulty.  Ambulation/Gait Ambulation/Gait assistance: Modified independent (Device/Increase time) Ambulation Distance (Feet): 400 Feet Assistive device: Rolling walker (2 wheeled);Straight cane Gait Pattern/deviations: Step-through pattern;Decreased stride length Gait velocity: Decreased Gait velocity interpretation: Below normal speed for age/gender General Gait Details: Gait is slowed but without LOB. Initially with RW and progressed to [facility]. Pt did not demonstrate R foot drop and no R knee buckling was noted during gait training.    Stairs Stairs: Yes   Stair Management: One rail Right;Alternating pattern;Forwards Number of Stairs: 10 General stair comments: Pt was cued for sequencing. No assist required.   Wheelchair Mobility    Modified Rankin (Stroke Patients Only)       Balance Overall balance assessment: Needs assistance Sitting-balance support: No upper extremity supported;Feet supported Sitting balance-Leahy Scale: Good     Standing balance support: Bilateral upper extremity supported;During functional activity Standing balance-Leahy Scale: Poor Standing balance comment: At least 1 UE support is required for safety                            Cognition Arousal/Alertness: Awake/alert Behavior During Therapy: WFL for tasks assessed/performed Overall Cognitive Status: Within Functional Limits for tasks assessed                                        Exercises      General Comments        Pertinent Vitals/Pain Pain Assessment: Faces Faces Pain Scale: Hurts a little bit Pain Location: back and R leg Pain Descriptors / Indicators: Sharp;Grimacing;Operative site guarding Pain Intervention(s): Limited activity within  patient's tolerance;Monitored during session;Repositioned    Home Living                      Prior Function            PT Goals (current goals can now be found in the  care plan section) Acute Rehab PT Goals Patient Stated Goal: to decrease pain  PT Goal Formulation: With patient Time For Goal Achievement: 08/06/16 Potential to Achieve Goals: Good Progress towards PT goals: Progressing toward goals    Frequency    Min 5X/week      PT Plan Current plan remains appropriate    Co-evaluation              AM-PAC PT "6 Clicks" Daily Activity  Outcome Measure  Difficulty turning over in bed (including adjusting bedclothes, sheets and blankets)?: None Difficulty moving from lying on back to sitting on the side of the bed? : None Difficulty sitting down on and standing up from a chair with arms (e.g., wheelchair, bedside commode, etc,.)?: None Help needed moving to and from a bed to chair (including a wheelchair)?: None Help needed walking in hospital room?: None Help needed climbing 3-5 steps with a railing? : None 6 Click Score: 24    End of Session Equipment Utilized During Treatment: Gait belt;Back brace Activity Tolerance: Patient limited by pain Patient left: in bed;with call bell/phone within reach;with family/visitor present Nurse Communication: Mobility status PT Visit Diagnosis: Other symptoms and signs involving the nervous system (R29.898);Other abnormalities of gait and mobility (R26.89);Pain Pain - part of body:  (back )     Time: 7680-8811 PT Time Calculation (min) (ACUTE ONLY): 23 min  Charges:  $Gait Training: 23-37 mins                    G Codes:       Rolinda Roan, PT, DPT Acute Rehabilitation Services Pager: [place] 07/31/2016, 10:12 AM

## 2016-07-31 NOTE — Progress Notes (Signed)
Patient alert and oriented, mae's well, voiding adequate amount of urine, swallowing without difficulty, no c/o pain at time of discharge. Patient discharged home with family. Script and discharged instructions given to patient. Patient and family stated understanding of instructions given. Patient has an appointment with Dr. Ditty  

## 2016-08-01 ENCOUNTER — Encounter (HOSPITAL_COMMUNITY): Payer: Self-pay | Admitting: Neurological Surgery

## 2016-08-14 DIAGNOSIS — D485 Neoplasm of uncertain behavior of skin: Secondary | ICD-10-CM | POA: Diagnosis not present

## 2016-08-14 DIAGNOSIS — C4492 Squamous cell carcinoma of skin, unspecified: Secondary | ICD-10-CM

## 2016-08-14 DIAGNOSIS — L578 Other skin changes due to chronic exposure to nonionizing radiation: Secondary | ICD-10-CM | POA: Diagnosis not present

## 2016-08-14 DIAGNOSIS — D0439 Carcinoma in situ of skin of other parts of face: Secondary | ICD-10-CM | POA: Diagnosis not present

## 2016-08-14 HISTORY — DX: Squamous cell carcinoma of skin, unspecified: C44.92

## 2016-08-18 NOTE — Op Note (Signed)
07/30/2016  6:24 PM  PATIENT:  Rodney Hall.  71 y.o. 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 I recommended the above procedure.  Patient understood the risks, benefits, and alternatives and potential outcomes and wished to proceed.  PROCEDURE DETAILS: The patient was brought to the operating room.  After smooth induction of general endotracheal anesthesia the patient was placed in the lateral position with the right side up and secured with tape.  Localizing views were taken with fluoroscopy.  The operative site was then prepped and draped in the usual sterile fashion.  The planned incisions were infiltrated with lidocaine with epinephrine.   The skin was sharply incised and soft tissue to the level of the oblique abdominal muscles was carried out with monopolar cautery.  I then bluntly dissected through the oblique muscles with care taken to preserve any nerves.  I encountered encountered transversalis fascia which was bluntly entered and this opened into an easily dissected fat plane consistent with the retroperitoneum.  I inserted the dilator and approached the posterior third of the L2-3 disc space.  I confirmed that I was not in contact with any nerves of the lumbar plexus. A K-wire was passed into the disc space.  The tract was sequentially dilated and then the retractor was introduced.  I incised the disc space and then passed a Cobb elevator along each endplate and penetrated the annulus on the  contralateral side.  I then used box cutters, a paddle, and a rasp to complete the discectomy.  A trial spacer was used to determine appropriate graft size and then a PEEK lordotic interbody graft packed with BMP and morselized allograft was inserted.  The retractor was removed after hemostasis was confirmed.   The incision was closed in layers with interrupted vicryl sutures.  The skin was closed with running monocryl stratafix sutures and dermabond.  I thin made an incision ~1.5 right of midline at the L2-3 disc space.  I plassed a K-wire to the L2 lamina and then sequentially dilated.  I passed a tube retractor over the dilator.  The operating microscope was brought into the field to provide light and magnification.  Utilizing microsurgical technique I thinned the right L2 lamina with a high speed burr until I reached ligamentum flavum.  I think expanded the laminotomy with the Kerrison punch.  I hooked the ligamentum flavum and pulled it caudally before resecting it with the punch.  I then resected the superior edge of the right L3 lamina.  I bit backwards to perform a medial facetectomy on the right and decompress the right L3 nerve root.  I then undercut the L2-3 spinous process and decompressed the contralateral side, visualizing and confirming decompression of the left L3 nerve root.  Hemostasis was obtained.  I irrigated with bacitracin saline.  The incision was closed in routine anatomic layers with interrupted vicryl sutures.  The skin was closed with a monocryl and dermabond.  The patient was then turned supine on a stretcher.  PATIENT DISPOSITION:  PACU - hemodynamically stable.   Delay  start of Pharmacological VTE agent (>24hrs) due to surgical blood loss or risk of bleeding:  yes

## 2016-08-29 DIAGNOSIS — M545 Low back pain: Secondary | ICD-10-CM | POA: Diagnosis not present

## 2016-09-03 DIAGNOSIS — M545 Low back pain: Secondary | ICD-10-CM | POA: Diagnosis not present

## 2016-09-10 DIAGNOSIS — M545 Low back pain: Secondary | ICD-10-CM | POA: Diagnosis not present

## 2016-09-12 DIAGNOSIS — M545 Low back pain: Secondary | ICD-10-CM | POA: Diagnosis not present

## 2016-09-17 DIAGNOSIS — M545 Low back pain: Secondary | ICD-10-CM | POA: Diagnosis not present

## 2016-09-18 DIAGNOSIS — N3020 Other chronic cystitis without hematuria: Secondary | ICD-10-CM | POA: Diagnosis not present

## 2016-09-18 DIAGNOSIS — R350 Frequency of micturition: Secondary | ICD-10-CM | POA: Diagnosis not present

## 2016-09-18 DIAGNOSIS — N3281 Overactive bladder: Secondary | ICD-10-CM | POA: Diagnosis not present

## 2016-09-18 DIAGNOSIS — N401 Enlarged prostate with lower urinary tract symptoms: Secondary | ICD-10-CM | POA: Diagnosis not present

## 2016-09-18 DIAGNOSIS — R351 Nocturia: Secondary | ICD-10-CM | POA: Diagnosis not present

## 2016-09-18 DIAGNOSIS — R3915 Urgency of urination: Secondary | ICD-10-CM | POA: Diagnosis not present

## 2016-09-18 DIAGNOSIS — N5201 Erectile dysfunction due to arterial insufficiency: Secondary | ICD-10-CM | POA: Diagnosis not present

## 2016-09-26 DIAGNOSIS — H43812 Vitreous degeneration, left eye: Secondary | ICD-10-CM | POA: Diagnosis not present

## 2016-10-01 DIAGNOSIS — D0439 Carcinoma in situ of skin of other parts of face: Secondary | ICD-10-CM | POA: Diagnosis not present

## 2016-10-02 DIAGNOSIS — M461 Sacroiliitis, not elsewhere classified: Secondary | ICD-10-CM | POA: Diagnosis not present

## 2016-10-10 ENCOUNTER — Other Ambulatory Visit: Payer: Self-pay | Admitting: Orthopedic Surgery

## 2016-10-10 DIAGNOSIS — M461 Sacroiliitis, not elsewhere classified: Secondary | ICD-10-CM

## 2016-10-18 ENCOUNTER — Ambulatory Visit: Admission: RE | Admit: 2016-10-18 | Payer: PPO | Source: Ambulatory Visit

## 2016-10-18 ENCOUNTER — Ambulatory Visit
Admission: RE | Admit: 2016-10-18 | Discharge: 2016-10-18 | Disposition: A | Discharge status: Home / Self Care | Payer: PPO | Source: Ambulatory Visit | Attending: Orthopedic Surgery | Admitting: Orthopedic Surgery

## 2016-10-18 DIAGNOSIS — M461 Sacroiliitis, not elsewhere classified: Secondary | ICD-10-CM | POA: Insufficient documentation

## 2016-10-18 DIAGNOSIS — M25552 Pain in left hip: Secondary | ICD-10-CM | POA: Diagnosis not present

## 2016-10-18 MED ORDER — METHYLPREDNISOLONE ACETATE 40 MG/ML INJ SUSP (RADIOLOG: 120.0000 mg | Freq: Once | INTRAMUSCULAR | Status: DC

## 2016-10-18 MED ORDER — LIDOCAINE HCL (PF) 1 % IJ SOLN
5.0000 mL | Freq: Once | INTRAMUSCULAR | Status: AC
  Administered 2016-10-18: 10 mL
  Filled 2016-10-18: qty 5

## 2016-10-18 MED ORDER — BUPIVACAINE HCL (PF) 0.25 % IJ SOLN
30.0000 mL | Freq: Once | INTRAMUSCULAR | Status: AC
  Administered 2016-10-18: 7 mL via INTRA_ARTICULAR

## 2016-10-18 MED ORDER — IOPAMIDOL (ISOVUE-300) INJECTION 61%
50.0000 mL | Freq: Once | INTRAVENOUS | Status: AC | PRN
  Administered 2016-10-18: 3 mL

## 2016-10-18 MED ORDER — METHYLPREDNISOLONE ACETATE 80 MG/ML IJ SUSP
INTRAMUSCULAR | Status: AC
  Administered 2016-10-18: 80 mg via INTRA_ARTICULAR
  Filled 2016-10-18: qty 1

## 2016-10-18 MED ORDER — BUPIVACAINE HCL (PF) 0.25 % IJ SOLN
INTRAMUSCULAR | Status: AC
  Administered 2016-10-18: 7 mL via INTRA_ARTICULAR
  Filled 2016-10-18: qty 30

## 2016-10-24 ENCOUNTER — Ambulatory Visit: Payer: Self-pay | Admitting: Family Medicine

## 2016-10-30 DIAGNOSIS — M461 Sacroiliitis, not elsewhere classified: Secondary | ICD-10-CM | POA: Diagnosis not present

## 2016-10-30 DIAGNOSIS — M545 Low back pain: Secondary | ICD-10-CM | POA: Diagnosis not present

## 2016-11-05 DIAGNOSIS — D0439 Carcinoma in situ of skin of other parts of face: Secondary | ICD-10-CM | POA: Diagnosis not present

## 2016-11-19 DIAGNOSIS — L578 Other skin changes due to chronic exposure to nonionizing radiation: Secondary | ICD-10-CM | POA: Diagnosis not present

## 2016-11-19 DIAGNOSIS — D0439 Carcinoma in situ of skin of other parts of face: Secondary | ICD-10-CM | POA: Diagnosis not present

## 2016-12-20 DIAGNOSIS — M25511 Pain in right shoulder: Secondary | ICD-10-CM | POA: Diagnosis not present

## 2016-12-20 DIAGNOSIS — M7541 Impingement syndrome of right shoulder: Secondary | ICD-10-CM | POA: Diagnosis not present

## 2017-01-01 DIAGNOSIS — R030 Elevated blood-pressure reading, without diagnosis of hypertension: Secondary | ICD-10-CM | POA: Diagnosis not present

## 2017-01-01 DIAGNOSIS — M4727 Other spondylosis with radiculopathy, lumbosacral region: Secondary | ICD-10-CM | POA: Diagnosis not present

## 2017-01-21 DIAGNOSIS — L570 Actinic keratosis: Secondary | ICD-10-CM | POA: Diagnosis not present

## 2017-01-21 DIAGNOSIS — D485 Neoplasm of uncertain behavior of skin: Secondary | ICD-10-CM | POA: Diagnosis not present

## 2017-01-21 DIAGNOSIS — L578 Other skin changes due to chronic exposure to nonionizing radiation: Secondary | ICD-10-CM | POA: Diagnosis not present

## 2017-01-21 DIAGNOSIS — D0439 Carcinoma in situ of skin of other parts of face: Secondary | ICD-10-CM | POA: Diagnosis not present

## 2017-01-21 DIAGNOSIS — C44319 Basal cell carcinoma of skin of other parts of face: Secondary | ICD-10-CM | POA: Diagnosis not present

## 2017-01-21 DIAGNOSIS — C4491 Basal cell carcinoma of skin, unspecified: Secondary | ICD-10-CM

## 2017-01-21 HISTORY — DX: Basal cell carcinoma of skin, unspecified: C44.91

## 2017-02-11 DIAGNOSIS — C44319 Basal cell carcinoma of skin of other parts of face: Secondary | ICD-10-CM | POA: Diagnosis not present

## 2017-04-30 DIAGNOSIS — D225 Melanocytic nevi of trunk: Secondary | ICD-10-CM | POA: Diagnosis not present

## 2017-04-30 DIAGNOSIS — L578 Other skin changes due to chronic exposure to nonionizing radiation: Secondary | ICD-10-CM | POA: Diagnosis not present

## 2017-04-30 DIAGNOSIS — L570 Actinic keratosis: Secondary | ICD-10-CM | POA: Diagnosis not present

## 2017-04-30 DIAGNOSIS — D1801 Hemangioma of skin and subcutaneous tissue: Secondary | ICD-10-CM | POA: Diagnosis not present

## 2017-04-30 DIAGNOSIS — L812 Freckles: Secondary | ICD-10-CM | POA: Diagnosis not present

## 2017-04-30 DIAGNOSIS — Z1283 Encounter for screening for malignant neoplasm of skin: Secondary | ICD-10-CM | POA: Diagnosis not present

## 2017-04-30 DIAGNOSIS — D485 Neoplasm of uncertain behavior of skin: Secondary | ICD-10-CM | POA: Diagnosis not present

## 2017-04-30 DIAGNOSIS — Z85828 Personal history of other malignant neoplasm of skin: Secondary | ICD-10-CM | POA: Diagnosis not present

## 2017-04-30 DIAGNOSIS — L821 Other seborrheic keratosis: Secondary | ICD-10-CM | POA: Diagnosis not present

## 2017-05-01 ENCOUNTER — Ambulatory Visit (INDEPENDENT_AMBULATORY_CARE_PROVIDER_SITE_OTHER): Payer: PPO | Admitting: Family Medicine

## 2017-05-01 ENCOUNTER — Encounter: Payer: Self-pay | Admitting: Family Medicine

## 2017-05-01 VITALS — BP 124/82 | HR 88 | Temp 98.1°F | Resp 16 | Ht 71.0 in | Wt 203.0 lb

## 2017-05-01 DIAGNOSIS — Z13220 Encounter for screening for lipoid disorders: Secondary | ICD-10-CM

## 2017-05-01 DIAGNOSIS — Z131 Encounter for screening for diabetes mellitus: Secondary | ICD-10-CM | POA: Diagnosis not present

## 2017-05-01 DIAGNOSIS — Z0000 Encounter for general adult medical examination without abnormal findings: Secondary | ICD-10-CM

## 2017-05-01 NOTE — Progress Notes (Signed)
Subjective:     Patient ID: Rodney Hall., male   DOB: Nov 03, 1945, 72 y.o.   MRN: [id number] Chief Complaint  Patient presents with  . Annual Wellness Exam    Patient feels well today with no complaints.    HPI States he continue to be followed by dermatology, Dr. Nehemiah Massed; urology, Dr. Yves Dill, and G.I., Dr. Vira Agar. Continues to exercise regularly riding a stationary bike for an hour 5-6 days/week. Discussed checking with his pharmacy regarding Shingrix vaccine.  Review of Systems General: Feeling well HEENT: regular dental visits and eye exams (glasses) Cardiovascular: no chest pain, shortness of breath, or palpitations GI: no heartburn, no change in bowel habits or blood in the stool GU: nocturia x 2, no change in bladder habits  Neuro: no change in memory; defers cognitive screen Psychiatric: not depressed Musculoskeletal: no joint pain. Had his third back surgery last year-no longer on mediation for back pain.    Objective:   Physical Exam  Constitutional: He appears well-developed and well-nourished. No distress.  Eyes: PERRLA, EOMI Neck: no thyromegaly, tenderness or nodules, no cervical adenopathy or carotid bruits. ENT: TM's intact without inflammation; No tonsillar enlargement or exudate, Lungs: Clear Heart : RRR without murmur or gallop Abd: bowel sounds present, soft, non-tender, no organomegaly Rectal: deferred to urology Extremities: no edema     Assessment:    1. Medicare annual wellness visit, subsequent  2. Screening for cholesterol level - Lipid panel  3. Screening for diabetes mellitus - Comprehensive metabolic panel    Plan:    Further f/u pending lab results. He will self schedule colonoscopy this year due to presence of polyp on prior exam 2014.

## 2017-05-01 NOTE — Patient Instructions (Addendum)
We will call you with the lab results. Do schedule your colonoscopy this year.

## 2017-05-02 ENCOUNTER — Telehealth: Payer: Self-pay

## 2017-05-02 ENCOUNTER — Other Ambulatory Visit: Payer: Self-pay | Admitting: Family Medicine

## 2017-05-02 DIAGNOSIS — E785 Hyperlipidemia, unspecified: Secondary | ICD-10-CM

## 2017-05-02 DIAGNOSIS — E782 Mixed hyperlipidemia: Secondary | ICD-10-CM

## 2017-05-02 LAB — LIPID PANEL
Chol/HDL Ratio: 5.3 ratio — ABNORMAL HIGH (ref 0.0–5.0)
Cholesterol, Total: 181 mg/dL (ref 100–199)
HDL: 34 mg/dL — ABNORMAL LOW
LDL Calculated: 103 mg/dL — ABNORMAL HIGH (ref 0–99)
Triglycerides: 221 mg/dL — ABNORMAL HIGH (ref 0–149)
VLDL Cholesterol Cal: 44 mg/dL — ABNORMAL HIGH (ref 5–40)

## 2017-05-02 LAB — COMPREHENSIVE METABOLIC PANEL WITH GFR
ALT: 19 [IU]/L (ref 0–44)
AST: 21 [IU]/L (ref 0–40)
Albumin/Globulin Ratio: 1.5 (ref 1.2–2.2)
Albumin: 4.1 g/dL (ref 3.5–4.8)
Alkaline Phosphatase: 75 [IU]/L (ref 39–117)
BUN/Creatinine Ratio: 7 — ABNORMAL LOW (ref 10–24)
BUN: 9 mg/dL (ref 8–27)
Bilirubin Total: 0.3 mg/dL (ref 0.0–1.2)
CO2: 22 mmol/L (ref 20–29)
Calcium: 9 mg/dL (ref 8.6–10.2)
Chloride: 106 mmol/L (ref 96–106)
Creatinine, Ser: 1.33 mg/dL — ABNORMAL HIGH (ref 0.76–1.27)
GFR calc Af Amer: 62 mL/min/{1.73_m2}
GFR calc non Af Amer: 53 mL/min/{1.73_m2} — ABNORMAL LOW
Globulin, Total: 2.8 g/dL (ref 1.5–4.5)
Glucose: 94 mg/dL (ref 65–99)
Potassium: 4.4 mmol/L (ref 3.5–5.2)
Sodium: 142 mmol/L (ref 134–144)
Total Protein: 6.9 g/dL (ref 6.0–8.5)

## 2017-05-02 MED ORDER — PRAVASTATIN SODIUM 20 MG PO TABS: 20.0000 mg | ORAL_TABLET | Freq: Every day | ORAL | 0 refills | Status: DC

## 2017-05-02 NOTE — Telephone Encounter (Signed)
Patient was advised he would like prescription sent to [facility] on [address]. KW

## 2017-05-02 NOTE — Telephone Encounter (Signed)
Patient advised , future order placed in chart. KW

## 2017-05-02 NOTE — Telephone Encounter (Signed)
I have sent in pravastatin to [facility] for 90 days. Have him call for lab slip in 4-6 weeks.

## 2017-05-02 NOTE — Telephone Encounter (Signed)
-----   Message from Carmon Ginsberg, [place] sent at 05/02/2017  7:33 AM EST ----- Labs look about the same as last year-normal sugar with stable kidney function. Your cholesterol is mildly elevated. Your calculated 10 year risk for developing cardiovascular disease is 20.3 % ( mainly due to your age). We usually recommend cholesterol lowering medication at 7.5% risk. Do your wish to proceed?

## 2017-05-21 ENCOUNTER — Ambulatory Visit (INDEPENDENT_AMBULATORY_CARE_PROVIDER_SITE_OTHER): Payer: PPO | Admitting: Family Medicine

## 2017-05-21 ENCOUNTER — Encounter: Payer: Self-pay | Admitting: Family Medicine

## 2017-05-21 VITALS — BP 132/82 | HR 82 | Temp 97.8°F | Resp 16

## 2017-05-21 DIAGNOSIS — R1013 Epigastric pain: Secondary | ICD-10-CM

## 2017-05-21 MED ORDER — HYDROCODONE-ACETAMINOPHEN 5-325 MG PO TABS: 1.0000 | ORAL_TABLET | Freq: Four times a day (QID) | ORAL | 0 refills | Status: AC | PRN

## 2017-05-21 NOTE — Progress Notes (Signed)
Subjective:     Patient ID: Rodney Hall., male   DOB: 1946-01-07, 72 y.o.   MRN: [id number] Chief Complaint  Patient presents with  . Abdominal Pain    x 1 day   HPI States he noticed it last week but went away when he went to low fat food choices and drank fruit juice. Recurred in the last 24 hours "with a vengeance". States waxing and waning pain ranges from 4-8. Currently at a 4. No nausea/vomiting/fever/chills/change in bowel pattern. He has not started his cholesterol medication yet but takes Naproxen twice daily due to back pain. No ETOH use. 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 Eating does not relieve the pain. Colonoscopy in 2014 with transverse/sigmoid colon diverticulosis.  Review of Systems     Objective:   Physical Exam  Constitutional: He appears well-developed and well-nourished. No distress.  Abdominal: Soft. There is no tenderness. There is no guarding.       Assessment:    1. Epigastric pain - Lipase - CBC with Differential/Platelet - H. pylori breath test - Comprehensive metabolic panel - US Abdomen Complete; Future - HYDROcodone-acetaminophen (NORCO/VICODIN) 5-325 MG tablet; Take 1 tablet by mouth every 6 (six) hours as needed for up to 5 days for moderate pain. One every 4-6 hours as needed for pain  Dispense: 20 tablet; Refill: 0    Plan:    Provided with samples of Dexilant #10. Further f/u pending lab studies.

## 2017-05-21 NOTE — Patient Instructions (Signed)
We will call you with the lab results and scheduling of the ultrasound. Hold Naproxen. Start Dexilant.

## 2017-05-22 LAB — CBC WITH DIFFERENTIAL/PLATELET
Basophils Absolute: 0 10*3/uL (ref 0.0–0.2)
Basos: 0 %
EOS (ABSOLUTE): 0.1 10*3/uL (ref 0.0–0.4)
Eos: 1 %
Hematocrit: 44.6 % (ref 37.5–51.0)
Hemoglobin: 15.4 g/dL (ref 13.0–17.7)
Immature Grans (Abs): 0 10*3/uL (ref 0.0–0.1)
Immature Granulocytes: 0 %
Lymphocytes Absolute: 2.1 10*3/uL (ref 0.7–3.1)
Lymphs: 14 %
MCH: 30.9 pg (ref 26.6–33.0)
MCHC: 34.5 g/dL (ref 31.5–35.7)
MCV: 90 fL (ref 79–97)
Monocytes Absolute: 1.1 10*3/uL — ABNORMAL HIGH (ref 0.1–0.9)
Monocytes: 8 %
Neutrophils Absolute: 10.9 10*3/uL — ABNORMAL HIGH (ref 1.4–7.0)
Neutrophils: 77 %
Platelets: 270 10*3/uL (ref 150–379)
RBC: 4.98 x10E6/uL (ref 4.14–5.80)
RDW: 13.1 % (ref 12.3–15.4)
WBC: 14.3 10*3/uL — ABNORMAL HIGH (ref 3.4–10.8)

## 2017-05-22 LAB — COMPREHENSIVE METABOLIC PANEL WITH GFR
ALT: 19 [IU]/L (ref 0–44)
AST: 25 [IU]/L (ref 0–40)
Albumin/Globulin Ratio: 1.4 (ref 1.2–2.2)
Albumin: 4.4 g/dL (ref 3.5–4.8)
Alkaline Phosphatase: 88 [IU]/L (ref 39–117)
BUN/Creatinine Ratio: 8 — ABNORMAL LOW (ref 10–24)
BUN: 11 mg/dL (ref 8–27)
Bilirubin Total: 0.4 mg/dL (ref 0.0–1.2)
CO2: 19 mmol/L — ABNORMAL LOW (ref 20–29)
Calcium: 9.6 mg/dL (ref 8.6–10.2)
Chloride: 99 mmol/L (ref 96–106)
Creatinine, Ser: 1.32 mg/dL — ABNORMAL HIGH (ref 0.76–1.27)
GFR calc Af Amer: 62 mL/min/{1.73_m2}
GFR calc non Af Amer: 54 mL/min/{1.73_m2} — ABNORMAL LOW
Globulin, Total: 3.2 g/dL (ref 1.5–4.5)
Glucose: 95 mg/dL (ref 65–99)
Potassium: 4.4 mmol/L (ref 3.5–5.2)
Sodium: 138 mmol/L (ref 134–144)
Total Protein: 7.6 g/dL (ref 6.0–8.5)

## 2017-05-22 LAB — LIPASE: Lipase: 48 U/L (ref 13–78)

## 2017-05-23 ENCOUNTER — Telehealth: Payer: Self-pay

## 2017-05-23 LAB — H. PYLORI BREATH TEST: H pylori Breath Test: NEGATIVE

## 2017-05-23 NOTE — Telephone Encounter (Signed)
Patient has been advised. KW 

## 2017-05-23 NOTE — Telephone Encounter (Signed)
-----   Message from Carmon Ginsberg, [place] sent at 05/23/2017  7:21 AM EDT ----- Breath test ok

## 2017-05-26 ENCOUNTER — Ambulatory Visit (INDEPENDENT_AMBULATORY_CARE_PROVIDER_SITE_OTHER): Payer: PPO | Admitting: Family Medicine

## 2017-05-26 ENCOUNTER — Ambulatory Visit: Admission: RE | Admit: 2017-05-26 | Payer: PPO | Source: Ambulatory Visit

## 2017-05-26 ENCOUNTER — Encounter: Payer: Self-pay | Admitting: Family Medicine

## 2017-05-26 VITALS — BP 94/50 | HR 96 | Temp 98.1°F | Resp 16 | Wt 196.0 lb

## 2017-05-26 DIAGNOSIS — R350 Frequency of micturition: Secondary | ICD-10-CM

## 2017-05-26 DIAGNOSIS — N390 Urinary tract infection, site not specified: Secondary | ICD-10-CM

## 2017-05-26 LAB — POCT URINALYSIS DIPSTICK
Blood, UA: NEGATIVE
Glucose, UA: NEGATIVE
Leukocytes, UA: NEGATIVE
Nitrite, UA: NEGATIVE
Spec Grav, UA: 1.015
Urobilinogen, UA: 0.2 U/dL
pH, UA: 6

## 2017-05-26 MED ORDER — DOXYCYCLINE HYCLATE 100 MG PO TABS: 100.0000 mg | ORAL_TABLET | Freq: Two times a day (BID) | ORAL | 0 refills | Status: DC

## 2017-05-26 NOTE — Progress Notes (Signed)
Patient: Rodney Hall. Male    DOB: Mar 26, 1945   72 y.o.   MRN: [id number] Visit Date: 05/26/2017  Today's Provider: Lelon Huh, MD   Chief Complaint  Patient presents with  . Urinary Frequency   Subjective:    Patient states he has had urinary frequency and urgency for 3 days. Patient also states that at times he will have bouts of chills and sweats. No pain, no burning. Patient states he has a history of interstitial cystitis.   Urinary Frequency   This is a new problem. The current episode started in the past 7 days (3 days). The problem occurs intermittently. The problem has been gradually worsening. The patient is experiencing no pain. There has been no fever. Associated symptoms include chills, frequency, sweats and urgency. Pertinent negatives include no discharge, flank pain, hematuria, hesitancy, nausea or vomiting. He has tried nothing for the symptoms.   Has history enlarged prostate on tamsulosin prescribed by Dr. Eliberto Ivory.      Allergies  Allergen Reactions  . Amoxicillin Rash    Has patient had a PCN reaction causing immediate rash, facial/tongue/throat swelling, SOB or lightheadedness with hypotension: Yes Has patient had a PCN reaction causing severe rash involving mucus membranes or skin necrosis: No Has patient had a PCN reaction that required hospitalization No Has patient had a PCN reaction occurring within the last 10 years: No If all of the above answers are "NO", then may proceed with Cephalosporin use.       Current Outpatient Medications:  .  HYDROcodone-acetaminophen (NORCO/VICODIN) 5-325 MG tablet, Take 1 tablet by mouth every 6 (six) hours as needed for up to 5 days for moderate pain. One every 4-6 hours as needed for pain, Disp: 20 tablet, Rfl: 0 .  imipramine (TOFRANIL) 50 MG tablet, Take 50 mg by mouth at bedtime. , Disp: , Rfl:  .  naproxen sodium (ANAPROX) 220 MG tablet, Take 220 mg by mouth 2 (two) times daily with a meal., Disp: ,  Rfl:  .  pentosan polysulfate (ELMIRON) 100 MG capsule, Take 100 mg by mouth 3 (three) times daily. , Disp: , Rfl:  .  pravastatin (PRAVACHOL) 20 MG tablet, Take 1 tablet (20 mg total) by mouth daily., Disp: 90 tablet, Rfl: 0 .  tamsulosin (FLOMAX) 0.4 MG CAPS capsule, Take 0.4 mg by mouth daily after supper. , Disp: , Rfl:   Review of Systems  Constitutional: Positive for chills. Negative for appetite change and fever.  Respiratory: Negative for chest tightness, shortness of breath and wheezing.   Cardiovascular: Negative for chest pain and palpitations.  Gastrointestinal: Negative for abdominal pain, nausea and vomiting.  Genitourinary: Positive for frequency and urgency. Negative for flank pain, hematuria and hesitancy.    Social History   Tobacco Use  . Smoking status: Never Smoker  . Smokeless tobacco: Never Used  Substance Use Topics  . Alcohol use: No    Alcohol/week: 0.0 oz   Objective:   BP (!) 94/50 (BP Location: Right Arm, Patient Position: Sitting, Cuff Size: Large)   Pulse 96   Temp 98.1 F (36.7 C) (Oral)   Resp 16   Wt 196 lb (88.9 kg)   SpO2 98%   BMI 27.34 kg/m  Vitals:   05/26/17 0945  BP: (!) 94/50  Pulse: 96  Resp: 16  Temp: 98.1 F (36.7 C)  TempSrc: Oral  SpO2: 98%  Weight: 196 lb (88.9 kg)     Physical Exam  General appearance: alert, well developed, well nourished, cooperative and in no distress Head: Normocephalic, without obvious abnormality, atraumatic Respiratory: Respirations even and unlabored, normal respiratory rate Extremities: No gross deformities Skin: Skin color, texture, turgor normal. No rashes seen  Psych: Appropriate mood and affect. Neurologic: Mental status: Alert, oriented to person, place, and time, thought content appropriate.  Results for orders placed or performed in visit on 05/26/17  POCT urinalysis dipstick  Result Value Ref Range   Color, UA Amber    Clarity, UA Cloudy    Glucose, UA Neg    Bilirubin, UA  Small    Ketones, UA Trace    Spec Grav, UA 1.015 1.010 - 1.025   Blood, UA Neg    pH, UA 6.0 5.0 - 8.0   Protein, UA 30++    Urobilinogen, UA 0.2 0.2 or 1.0 E.U./dL   Nitrite, UA Neg    Leukocytes, UA Negative Negative   Appearance Cloudy    Odor Strong        Assessment & Plan:     1. Urinary frequency Check culture. Suspect prostatitis. Start doxycycline while awaiting cultures.  - POCT urinalysis dipstick - doxycycline (VIBRA-TABS) 100 MG tablet; Take 1 tablet (100 mg total) by mouth 2 (two) times daily for 14 days.  Dispense: 28 tablet; Refill: 0  2. Urinary tract infection without hematuria, site unspecified  - CULTURE, URINE COMPREHENSIVE       Lelon Huh, MD  [facility]

## 2017-05-28 ENCOUNTER — Telehealth: Payer: Self-pay | Admitting: Family Medicine

## 2017-05-28 ENCOUNTER — Ambulatory Visit
Admission: RE | Admit: 2017-05-28 | Discharge: 2017-05-28 | Disposition: A | Discharge status: Home / Self Care | Payer: PPO | Source: Ambulatory Visit | Attending: Family Medicine | Admitting: Family Medicine

## 2017-05-28 DIAGNOSIS — K862 Cyst of pancreas: Secondary | ICD-10-CM | POA: Insufficient documentation

## 2017-05-28 DIAGNOSIS — R1013 Epigastric pain: Secondary | ICD-10-CM | POA: Diagnosis present

## 2017-05-28 DIAGNOSIS — K839 Disease of biliary tract, unspecified: Secondary | ICD-10-CM | POA: Diagnosis not present

## 2017-05-28 DIAGNOSIS — K824 Cholesterolosis of gallbladder: Secondary | ICD-10-CM | POA: Diagnosis not present

## 2017-05-28 LAB — CULTURE, URINE COMPREHENSIVE

## 2017-05-28 NOTE — Telephone Encounter (Signed)
Pt states he would like to see someone for his pancreas.

## 2017-05-28 NOTE — Telephone Encounter (Signed)
Please review and advise. KW 

## 2017-05-29 ENCOUNTER — Other Ambulatory Visit: Payer: Self-pay | Admitting: Family Medicine

## 2017-05-29 DIAGNOSIS — K862 Cyst of pancreas: Secondary | ICD-10-CM

## 2017-05-29 NOTE — Telephone Encounter (Signed)
Referral in progress. 

## 2017-06-01 DIAGNOSIS — Z981 Arthrodesis status: Secondary | ICD-10-CM | POA: Diagnosis not present

## 2017-06-01 DIAGNOSIS — R7989 Other specified abnormal findings of blood chemistry: Secondary | ICD-10-CM | POA: Diagnosis present

## 2017-06-01 DIAGNOSIS — N3010 Interstitial cystitis (chronic) without hematuria: Secondary | ICD-10-CM | POA: Diagnosis not present

## 2017-06-01 DIAGNOSIS — R509 Fever, unspecified: Secondary | ICD-10-CM | POA: Diagnosis not present

## 2017-06-01 DIAGNOSIS — Z79899 Other long term (current) drug therapy: Secondary | ICD-10-CM | POA: Diagnosis not present

## 2017-06-01 DIAGNOSIS — R109 Unspecified abdominal pain: Secondary | ICD-10-CM | POA: Diagnosis not present

## 2017-06-01 DIAGNOSIS — N400 Enlarged prostate without lower urinary tract symptoms: Secondary | ICD-10-CM | POA: Diagnosis not present

## 2017-06-01 DIAGNOSIS — R1013 Epigastric pain: Secondary | ICD-10-CM | POA: Diagnosis not present

## 2017-06-01 DIAGNOSIS — R945 Abnormal results of liver function studies: Secondary | ICD-10-CM | POA: Diagnosis not present

## 2017-06-01 DIAGNOSIS — R932 Abnormal findings on diagnostic imaging of liver and biliary tract: Secondary | ICD-10-CM | POA: Diagnosis not present

## 2017-06-01 DIAGNOSIS — R1010 Upper abdominal pain, unspecified: Secondary | ICD-10-CM | POA: Diagnosis not present

## 2017-06-01 DIAGNOSIS — R740 Nonspecific elevation of levels of transaminase and lactic acid dehydrogenase [LDH]: Secondary | ICD-10-CM | POA: Diagnosis not present

## 2017-06-01 DIAGNOSIS — D72829 Elevated white blood cell count, unspecified: Secondary | ICD-10-CM | POA: Diagnosis not present

## 2017-06-01 DIAGNOSIS — K862 Cyst of pancreas: Secondary | ICD-10-CM | POA: Diagnosis not present

## 2017-06-01 DIAGNOSIS — R000 Tachycardia, unspecified: Secondary | ICD-10-CM | POA: Diagnosis not present

## 2017-06-01 DIAGNOSIS — K810 Acute cholecystitis: Secondary | ICD-10-CM | POA: Diagnosis not present

## 2017-06-01 DIAGNOSIS — R1011 Right upper quadrant pain: Secondary | ICD-10-CM | POA: Diagnosis not present

## 2017-06-01 DIAGNOSIS — K8042 Calculus of bile duct with acute cholecystitis without obstruction: Secondary | ICD-10-CM | POA: Diagnosis not present

## 2017-06-02 ENCOUNTER — Inpatient Hospital Stay: Payer: PPO

## 2017-06-02 ENCOUNTER — Emergency Department: Payer: PPO

## 2017-06-02 ENCOUNTER — Inpatient Hospital Stay
Admission: EM | Admit: 2017-06-02 | Discharge: 2017-06-04 | DRG: 440 | Disposition: A | Discharge status: Home / Self Care | Payer: PPO | Attending: Internal Medicine | Admitting: Internal Medicine

## 2017-06-02 ENCOUNTER — Encounter: Payer: Self-pay | Admitting: Emergency Medicine

## 2017-06-02 ENCOUNTER — Other Ambulatory Visit: Payer: Self-pay

## 2017-06-02 DIAGNOSIS — K862 Cyst of pancreas: Secondary | ICD-10-CM | POA: Diagnosis present

## 2017-06-02 DIAGNOSIS — R109 Unspecified abdominal pain: Secondary | ICD-10-CM

## 2017-06-02 DIAGNOSIS — R1013 Epigastric pain: Secondary | ICD-10-CM | POA: Diagnosis not present

## 2017-06-02 DIAGNOSIS — R7989 Other specified abnormal findings of blood chemistry: Secondary | ICD-10-CM | POA: Diagnosis present

## 2017-06-02 DIAGNOSIS — K8042 Calculus of bile duct with acute cholecystitis without obstruction: Secondary | ICD-10-CM | POA: Diagnosis present

## 2017-06-02 DIAGNOSIS — R945 Abnormal results of liver function studies: Secondary | ICD-10-CM | POA: Diagnosis not present

## 2017-06-02 DIAGNOSIS — K819 Cholecystitis, unspecified: Secondary | ICD-10-CM

## 2017-06-02 DIAGNOSIS — Z981 Arthrodesis status: Secondary | ICD-10-CM | POA: Diagnosis not present

## 2017-06-02 DIAGNOSIS — Z79899 Other long term (current) drug therapy: Secondary | ICD-10-CM | POA: Diagnosis not present

## 2017-06-02 DIAGNOSIS — N3010 Interstitial cystitis (chronic) without hematuria: Secondary | ICD-10-CM | POA: Diagnosis present

## 2017-06-02 DIAGNOSIS — R1010 Upper abdominal pain, unspecified: Secondary | ICD-10-CM | POA: Diagnosis not present

## 2017-06-02 DIAGNOSIS — N400 Enlarged prostate without lower urinary tract symptoms: Secondary | ICD-10-CM | POA: Diagnosis present

## 2017-06-02 HISTORY — DX: Unspecified abdominal pain: R10.9

## 2017-06-02 HISTORY — DX: Cholecystitis, unspecified: K81.9

## 2017-06-02 LAB — COMPREHENSIVE METABOLIC PANEL WITH GFR
ALT: 290 U/L — ABNORMAL HIGH (ref 17–63)
AST: 413 U/L — ABNORMAL HIGH (ref 15–41)
Albumin: 3.9 g/dL (ref 3.5–5.0)
Alkaline Phosphatase: 361 U/L — ABNORMAL HIGH (ref 38–126)
Anion gap: 10 (ref 5–15)
BUN: 14 mg/dL (ref 6–20)
CO2: 25 mmol/L (ref 22–32)
Calcium: 9.2 mg/dL (ref 8.9–10.3)
Chloride: 100 mmol/L — ABNORMAL LOW (ref 101–111)
Creatinine, Ser: 1.25 mg/dL — ABNORMAL HIGH (ref 0.61–1.24)
GFR calc Af Amer: >60 mL/min
GFR calc non Af Amer: 56 mL/min — ABNORMAL LOW
Glucose, Bld: 169 mg/dL — ABNORMAL HIGH (ref 65–99)
Potassium: 4.1 mmol/L (ref 3.5–5.1)
Sodium: 135 mmol/L (ref 135–145)
Total Bilirubin: 2.2 mg/dL — ABNORMAL HIGH (ref 0.3–1.2)
Total Protein: 9.2 g/dL — ABNORMAL HIGH (ref 6.5–8.1)

## 2017-06-02 LAB — URINALYSIS, COMPLETE (UACMP) WITH MICROSCOPIC
Bilirubin Urine: NEGATIVE
Glucose, UA: NEGATIVE mg/dL
Hgb urine dipstick: NEGATIVE
Ketones, ur: NEGATIVE mg/dL
Leukocytes, UA: NEGATIVE
Nitrite: NEGATIVE
Protein, ur: NEGATIVE mg/dL
RBC / HPF: NONE SEEN RBC/hpf (ref 0–5)
Specific Gravity, Urine: 1.006 (ref 1.005–1.030)
pH: 6 (ref 5.0–8.0)

## 2017-06-02 LAB — CBC
HCT: 47.2 % (ref 40.0–52.0)
Hemoglobin: 15.8 g/dL (ref 13.0–18.0)
MCH: 29.9 pg (ref 26.0–34.0)
MCHC: 33.4 g/dL (ref 32.0–36.0)
MCV: 89.8 fL (ref 80.0–100.0)
Platelets: 491 10*3/uL — ABNORMAL HIGH (ref 150–440)
RBC: 5.26 MIL/uL (ref 4.40–5.90)
RDW: 13.3 % (ref 11.5–14.5)
WBC: 25.1 10*3/uL — ABNORMAL HIGH (ref 3.8–10.6)

## 2017-06-02 LAB — LIPASE, BLOOD: Lipase: 296 U/L — ABNORMAL HIGH (ref 11–51)

## 2017-06-02 LAB — PROTIME-INR
INR: 1.21
Prothrombin Time: 15.2 s (ref 11.4–15.2)

## 2017-06-02 LAB — BILIRUBIN, DIRECT: Bilirubin, Direct: 1.6 mg/dL — ABNORMAL HIGH (ref 0.1–0.5)

## 2017-06-02 MED ORDER — ONDANSETRON HCL 4 MG/2ML IJ SOLN
4.0000 mg | Freq: Once | INTRAMUSCULAR | Status: AC
  Administered 2017-06-02: 4 mg via INTRAVENOUS
  Filled 2017-06-02: qty 2

## 2017-06-02 MED ORDER — DEXTROSE-NACL 5-0.45 % IV SOLN
INTRAVENOUS | Status: DC
  Administered 2017-06-02 – 2017-06-04 (×6): via INTRAVENOUS

## 2017-06-02 MED ORDER — TRAZODONE HCL 50 MG PO TABS: 50.0000 mg | ORAL_TABLET | Freq: Every evening | ORAL | Status: DC | PRN

## 2017-06-02 MED ORDER — PRAVASTATIN SODIUM 20 MG PO TABS
20.0000 mg | ORAL_TABLET | Freq: Every day | ORAL | Status: DC
  Administered 2017-06-02 – 2017-06-03 (×2): 20 mg via ORAL
  Filled 2017-06-02 (×2): qty 1

## 2017-06-02 MED ORDER — SODIUM CHLORIDE 0.9 % IV SOLN
2.0000 g | INTRAVENOUS | Status: DC
  Administered 2017-06-02 – 2017-06-04 (×3): 2 g via INTRAVENOUS
  Filled 2017-06-02 (×3): qty 20

## 2017-06-02 MED ORDER — HYDROCODONE-ACETAMINOPHEN 5-325 MG PO TABS: 1.0000 | ORAL_TABLET | ORAL | Status: DC | PRN

## 2017-06-02 MED ORDER — TECHNETIUM TC 99M MEBROFENIN IV KIT
5.3890 | PACK | Freq: Once | INTRAVENOUS | Status: AC | PRN
  Administered 2017-06-02: 5.389 via INTRAVENOUS

## 2017-06-02 MED ORDER — MORPHINE SULFATE (PF) 4 MG/ML IV SOLN
4.0000 mg | Freq: Once | INTRAVENOUS | Status: AC
  Administered 2017-06-02: 4 mg via INTRAVENOUS
  Filled 2017-06-02: qty 1

## 2017-06-02 MED ORDER — SODIUM CHLORIDE 0.9 % IV BOLUS
1000.0000 mL | Freq: Once | INTRAVENOUS | Status: AC
  Administered 2017-06-02: 1000 mL via INTRAVENOUS

## 2017-06-02 MED ORDER — METRONIDAZOLE IN NACL 5-0.79 MG/ML-% IV SOLN
500.0000 mg | Freq: Once | INTRAVENOUS | Status: AC
  Administered 2017-06-02: 500 mg via INTRAVENOUS
  Filled 2017-06-02: qty 100

## 2017-06-02 MED ORDER — MORPHINE SULFATE (PF) 2 MG/ML IV SOLN: 2.0000 mg | INTRAVENOUS | Status: DC | PRN

## 2017-06-02 MED ORDER — TAMSULOSIN HCL 0.4 MG PO CAPS
0.4000 mg | ORAL_CAPSULE | Freq: Every day | ORAL | Status: DC
  Administered 2017-06-02 – 2017-06-03 (×2): 0.4 mg via ORAL
  Filled 2017-06-02 (×2): qty 1

## 2017-06-02 MED ORDER — CIPROFLOXACIN IN D5W 400 MG/200ML IV SOLN
400.0000 mg | Freq: Once | INTRAVENOUS | Status: AC
  Administered 2017-06-02: 400 mg via INTRAVENOUS
  Filled 2017-06-02: qty 200

## 2017-06-02 MED ORDER — ONDANSETRON HCL 4 MG/2ML IJ SOLN: 4.0000 mg | Freq: Four times a day (QID) | INTRAMUSCULAR | Status: DC | PRN

## 2017-06-02 MED ORDER — HEPARIN SODIUM (PORCINE) 5000 UNIT/ML IJ SOLN
5000.0000 [IU] | Freq: Three times a day (TID) | INTRAMUSCULAR | Status: DC
  Administered 2017-06-02 – 2017-06-04 (×7): 5000 [IU] via SUBCUTANEOUS
  Filled 2017-06-02 (×7): qty 1

## 2017-06-02 MED ORDER — GADOBENATE DIMEGLUMINE 529 MG/ML IV SOLN
18.0000 mL | Freq: Once | INTRAVENOUS | Status: AC | PRN
  Administered 2017-06-02: 18 mL via INTRAVENOUS

## 2017-06-02 MED ORDER — IMIPRAMINE HCL 50 MG PO TABS
50.0000 mg | ORAL_TABLET | Freq: Every day | ORAL | Status: DC
  Administered 2017-06-02 – 2017-06-03 (×2): 50 mg via ORAL
  Filled 2017-06-02 (×3): qty 1

## 2017-06-02 MED ORDER — POLYETHYLENE GLYCOL 3350 17 G PO PACK: 17.0000 g | PACK | Freq: Every day | ORAL | Status: DC | PRN

## 2017-06-02 MED ORDER — ONDANSETRON HCL 4 MG PO TABS: 4.0000 mg | ORAL_TABLET | Freq: Four times a day (QID) | ORAL | Status: DC | PRN

## 2017-06-02 NOTE — Consult Note (Signed)
Rodney Lame, MD [facility]  [address], [place], [place] [address] Phone: [phone] Fax : [phone]  Consultation  Referring Provider:     Dr. Hampton Abbot Primary Care Physician:  Carmon Ginsberg, [place] Primary Gastroenterologist:  Dr. Vicente Males         Reason for Consultation:     Elevated liver enzymes and pancreatic enzymes  Date of Admission:  06/02/2017 Date of Consultation:  06/02/2017         HPI:   Rodney Blankley. is a 72 y.o. male who was admitted with epigastric pain.  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  The patient came in with elevated liver enzymes and an elevated white cell count.  The patient's white cell count was 25.1 and his AST was 413 ALT of 90 and alkaline phosphatase 361.  The patient's bilirubin was also elevated at 2.2.  His lipase was 296.  He now reports that his pain is much better less last night.  The patient had an ultrasound that showed gallbladder consistent with cholecystitis without Murphy sign.  Patient underwent an MRCP today that showed a 6.6 cm homogeneous lesion adjacent to the esophagus at the GE junction generating a mass-effect on the liver and proximal stomach.  The lesion appears to be hepatic in origin although cannot be ruled out to be from the distal esophagus.  The MRI also showed diffuse gallbladder wall thickening with dependent sludge in the gallbladder but no definite stones.  There is also no sign of choledocholithiasis.  The pancreas and pancreatic ducts appeared normal.  Past Medical History:  Diagnosis Date  . Anxiety   . Arthritis   . History of actinic keratoses   . Hyperglycemia     Past Surgical History:  Procedure Laterality Date  . ANTERIOR LAT LUMBAR FUSION Right 07/30/2016   Procedure: Right Lateral two-three Lateral transpsoas interbody fusion with lateral plating/Minimally invasive decompression at  Lumbar two-three;  Surgeon: Ditty, Kevan Ny, MD;  Location: [place];  Service: Neurosurgery;  Laterality: Right;  . BACK SURGERY  2012  . CATARACT EXTRACTION, BILATERAL    . COLONOSCOPY W/ POLYPECTOMY    . NECK SURGERY  2000  . TONSILLECTOMY AND ADENOIDECTOMY      Prior to Admission medications   Medication Sig Start Date End Date Taking? Authorizing Provider  imipramine (TOFRANIL) 50 MG tablet Take 50 mg by mouth at bedtime.    Yes [provider]  pentosan polysulfate (ELMIRON) 100 MG capsule Take 100 mg by mouth 3 (three) times daily.    Yes [provider]  pravastatin (PRAVACHOL) 20 MG tablet Take 1 tablet (20 mg total) by mouth daily. 05/02/17  Yes Carmon Ginsberg, PA  tamsulosin (FLOMAX) 0.4 MG CAPS capsule Take 0.4 mg by mouth daily after supper.    Yes [provider]  naproxen sodium (ANAPROX) 220 MG tablet Take 220 mg by mouth 2 (two) times daily with a meal.    [provider]    Family History  Problem Relation Age of Onset  . Cancer Mother        brain     Social History   Tobacco Use  . Smoking status: Never Smoker  . Smokeless tobacco: Never Used  Substance Use Topics  . Alcohol use: No    Alcohol/week: 0.0 oz  . Drug use:  No    Allergies as of 06/01/2017 - Review Complete 05/26/2017  Allergen Reaction Noted  . Amoxicillin Rash 04/06/2015    Review of Systems:    All systems reviewed and negative except where noted in HPI.   Physical Exam:  Vital signs in last 24 hours: Temp:  [97.7 F (36.5 C)-99 F (37.2 C)] 98.8 F (37.1 C) (04/01 1300) Pulse Rate:  [94-115] 94 (04/01 1300) Resp:  [16-18] 17 (04/01 1300) BP: (114-145)/(62-74) 127/74 (04/01 1300) SpO2:  [95 %-100 %] 97 % (04/01 1300) Weight:  [190 lb (86.2 kg)-192 lb 3.9 oz (87.2 kg)] 192 lb 3.9 oz (87.2 kg) (04/01 0500) Last BM Date: 05/31/17 General:   Pleasant, cooperative in NAD Head:  Normocephalic and atraumatic. Eyes:   No icterus.   Conjunctiva  pink. PERRLA. Ears:  Normal auditory acuity. Neck:  Supple; no masses or thyroidomegaly Lungs: Respirations even and unlabored. Lungs clear to auscultation bilaterally.   No wheezes, crackles, or rhonchi.  Heart:  Regular rate and rhythm;  Without murmur, clicks, rubs or gallops Abdomen:  Soft, nondistended, positive tenderness. Normal bowel sounds. No appreciable masses or hepatomegaly.  No rebound or guarding.  Rectal:  Not performed. Msk:  Symmetrical without gross deformities.  S  Extremities:  Without edema, cyanosis or clubbing. Neurologic:  Alert and oriented x3;  grossly normal neurologically. Skin:  Intact without significant lesions or rashes. Cervical Nodes:  No significant cervical adenopathy. Psych:  Alert and cooperative. Normal affect.  LAB RESULTS: Recent Labs    06/02/17 0017  WBC 25.1*  HGB 15.8  HCT 47.2  PLT 491*   BMET Recent Labs    06/02/17 0017  NA 135  K 4.1  CL 100*  CO2 25  GLUCOSE 169*  BUN 14  CREATININE 1.25*  CALCIUM 9.2   LFT Recent Labs    06/02/17 0017 06/02/17 0519  PROT 9.2*  --   ALBUMIN 3.9  --   AST 413*  --   ALT 290*  --   ALKPHOS 361*  --   BILITOT 2.2*  --   BILIDIR  --  1.6*   PT/INR Recent Labs    06/02/17 0402  LABPROT 15.2  INR 1.21    STUDIES: Mr 3d Recon At Scanner  Result Date: 06/02/2017 CLINICAL DATA:  Acute onset epigastric pain with elevated LFTs and lipase level. Possible gallstones on ultrasound. EXAM: MRI ABDOMEN WITHOUT AND WITH CONTRAST (INCLUDING MRCP) TECHNIQUE: Multiplanar multisequence MR imaging of the abdomen was performed both before and after the administration of intravenous contrast. Heavily T2-weighted images of the biliary and pancreatic ducts were obtained, and three-dimensional MRCP images were rendered by post processing. CONTRAST:  107mL MULTIHANCE GADOBENATE DIMEGLUMINE 529 MG/ML IV SOLN COMPARISON:  Ultrasound exam from earlier the same day. FINDINGS: Lower chest: Trace dependent  atelectasis.  Otherwise unremarkable. Hepatobiliary: Liver measures 20.8 cm craniocaudal length. No focal abnormality within the liver parenchyma. Gallbladder shows diffuse wall thickening as noted on recent ultrasound exam. There is some sludge in the neck of the gallbladder without a definite gallstone evident. There is no intra or extrahepatic biliary duct dilatation. No evidence for choledocholithiasis. Pancreas: No focal mass lesion. No dilatation of the main duct. No intraparenchymal cyst. No peripancreatic edema. Spleen:  No splenomegaly. No focal mass lesion. Adrenals/Urinary Tract: No adrenal nodule or mass. Right kidney unremarkable. 6 mm simple cyst noted in the interpolar left kidney with another 2.1 cm cyst in the lateral aspect of the left kidney. Stomach/Bowel: 5.3  x 5.1 x 6.6 cm well-defined homogeneous mass is identified in the upper abdomen, anterior to the esophagogastric junction and displacing the lateral segment of the left liver anteriorly and the mid stomach laterally. Postcontrast T1 weighted imaging shows preservation of the fat plane between the lesion and the left liver. Coronal post-contrast imaging suggests that the lesion arises from the distal esophagus or proximal stomach in the region of the esophagogastric junction (see image 47 of coronal postcontrast series 25). The lesion has intermediate signal intensity on both T1 and T2 weighted imaging. No discernible enhancement within the lesion after IV contrast administration. Stomach is otherwise unremarkable. Duodenum is normally positioned as is the ligament of Treitz. No small bowel or colonic dilatation within the visualized abdomen. Vascular/Lymphatic: No abdominal aortic aneurysm. Portal vein, superior mesenteric vein, and splenic vein are patent. Celiac axis and SMA enhance after IV contrast administration. No abdominal lymphadenopathy Other:  No intraperitoneal free fluid. Musculoskeletal: Susceptibility artifact from lumbar  fusion hardware evident. No abnormal marrow enhancement within the visualized bony anatomy. IMPRESSION: 1. 6.6 cm well defined, homogeneous, nonenhancing mass is identified anterior to the esophagogastric junction, generating mass-effect on the left liver and proximal/mid stomach. Lesion does not appear to be hepatic in origin, but coronal imaging suggests origin from distal esophagus/esophagogastric junction. 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 Esophageal or gastric duplication cyst would be a consideration. Given the marked homogeneity of the lesion and lack of discernible enhancement, GI stromal tumor considered less likely. Upper endoscopy with endoscopic ultrasound should be considered. 2. Diffuse gallbladder wall thickening with dependent sludge in the gallbladder lumen. No definite gallstones identified. There is no intra or extrahepatic biliary duct dilatation and no MR findings of choledocholithiasis. 3. Normal MR appearance of the pancreas with no ductal dilatation, mass lesion, or peripancreatic edema/inflammation. Pancreatic parenchyma enhances throughout. Electronically Signed   By: Misty Stanley M.D.   On: 06/02/2017 11:32   Mr Abdomen Mrcp Moise Boring Contast  Result Date: 06/02/2017 CLINICAL DATA:  Acute onset epigastric pain with elevated LFTs and lipase level. Possible gallstones on ultrasound. EXAM: MRI ABDOMEN WITHOUT AND WITH CONTRAST (INCLUDING MRCP) TECHNIQUE: Multiplanar multisequence MR imaging of the abdomen was performed both before and after the administration of intravenous contrast. Heavily T2-weighted images of the biliary and pancreatic ducts were obtained, and three-dimensional MRCP images were rendered by post processing. CONTRAST:  42mL MULTIHANCE GADOBENATE DIMEGLUMINE 529 MG/ML IV SOLN COMPARISON:  Ultrasound exam from earlier the same day. FINDINGS: Lower chest: Trace dependent  atelectasis.  Otherwise unremarkable. Hepatobiliary: Liver measures 20.8 cm craniocaudal length. No focal abnormality within the liver parenchyma. Gallbladder shows diffuse wall thickening as noted on recent ultrasound exam. There is some sludge in the neck of the gallbladder without a definite gallstone evident. There is no intra or extrahepatic biliary duct dilatation. No evidence for choledocholithiasis. Pancreas: No focal mass lesion. No dilatation of the main duct. No intraparenchymal cyst. No peripancreatic edema. Spleen:  No splenomegaly. No focal mass lesion. Adrenals/Urinary Tract: No adrenal nodule or mass. Right kidney unremarkable. 6 mm simple cyst noted in the interpolar left kidney with another 2.1 cm cyst in the lateral aspect of the left kidney. Stomach/Bowel: 5.3 x 5.1 x 6.6 cm well-defined homogeneous mass is identified in the upper abdomen, anterior to the esophagogastric junction and displacing the lateral segment of the left liver anteriorly and the mid stomach laterally. Postcontrast T1 weighted imaging shows  preservation of the fat plane between the lesion and the left liver. Coronal post-contrast imaging suggests that the lesion arises from the distal esophagus or proximal stomach in the region of the esophagogastric junction (see image 47 of coronal postcontrast series 25). The lesion has intermediate signal intensity on both T1 and T2 weighted imaging. No discernible enhancement within the lesion after IV contrast administration. Stomach is otherwise unremarkable. Duodenum is normally positioned as is the ligament of Treitz. No small bowel or colonic dilatation within the visualized abdomen. Vascular/Lymphatic: No abdominal aortic aneurysm. Portal vein, superior mesenteric vein, and splenic vein are patent. Celiac axis and SMA enhance after IV contrast administration. No abdominal lymphadenopathy Other:  No intraperitoneal free fluid. Musculoskeletal: Susceptibility artifact from lumbar  fusion hardware evident. No abnormal marrow enhancement within the visualized bony anatomy. IMPRESSION: 1. 6.6 cm well defined, homogeneous, nonenhancing mass is identified anterior to the esophagogastric junction, generating mass-effect on the left liver and proximal/mid stomach. Lesion does not appear to be hepatic in origin, but coronal imaging suggests origin from distal esophagus/esophagogastric junction. 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 Esophageal or gastric duplication cyst would be a consideration. Given the marked homogeneity of the lesion and lack of discernible enhancement, GI stromal tumor considered less likely. Upper endoscopy with endoscopic ultrasound should be considered. 2. Diffuse gallbladder wall thickening with dependent sludge in the gallbladder lumen. No definite gallstones identified. There is no intra or extrahepatic biliary duct dilatation and no MR findings of choledocholithiasis. 3. Normal MR appearance of the pancreas with no ductal dilatation, mass lesion, or peripancreatic edema/inflammation. Pancreatic parenchyma enhances throughout. Electronically Signed   By: Misty Stanley M.D.   On: 06/02/2017 11:32   US Abdomen Limited Ruq  Result Date: 06/02/2017 CLINICAL DATA:  Intermittent RIGHT upper quadrant abdominal pain for 2 weeks, worsening today. History of gallstones. EXAM: ULTRASOUND ABDOMEN LIMITED RIGHT UPPER QUADRANT COMPARISON:  Abdominal ultrasound May 28, 2017. FINDINGS: Gallbladder: Gallbladder wall is thickened at 5 mm. Sludge ball. 5 mm echogenic mural based polyp versus gallstone. Pericholecystic fluid. No sonographic Murphy's sign elicited. Common bile duct: Diameter: 7 mm.  No choledocholithiasis. Liver: 15 mm echogenic focus adjacent to gallbladder, possible hemangioma. Normal limits in parenchymal echogenicity. Portal vein is patent on color Doppler imaging  with normal direction of blood flow towards the liver. IMPRESSION: Stable examination: Sonographic findings of cholecystitis without sonographic Murphy sign to confirm acute component. Electronically Signed   By: Elon Alas M.D.   On: 06/02/2017 03:25      Impression / Plan:   Rodney Lamba. is a 72 y.o. y/o male with a 6.6 cm nonenhancing mass in the area of the GE junction which may be a pancreatic or hepatic origin.  This will need to be investigated with an endoscopic ultrasound in the future.  The patient also has diffuse gallbladder wall thickening suggestive of cholecystitis on the ultrasound and MRI.  The patient will be set up for a HIDA scan.  If this is positive the patient should have his gallbladder taken out.  HIDA scan is negative then the patient will likely need an upper endoscopy and follow-up with a endoscopic ultrasound.  The patient has been explained the plan and agrees with it.  Thank you for involving me in the care of this patient.      LOS: 0 days   Rodney Lame, MD  06/02/2017, 2:43 PM  Note: This dictation was prepared with Dragon dictation along with smaller phrase technology. Any transcriptional errors that result from this process are unintentional.

## 2017-06-02 NOTE — ED Provider Notes (Signed)
[facility] Emergency Department Provider Note   ____________________________________________   First MD Initiated Contact with Patient 06/02/17 [phone]     (approximate)  I have reviewed the triage vital signs and the nursing notes.   HISTORY  Chief Complaint Abdominal Pain    HPI Rodney Arreguin. is a 72 y.o. male who comes into the hospital today with some abdominal pain.  The patient states that he started off the day as normal.  He reports that he ate some cake and then started having some epigastric pain.  He reports that he could do anything to get better.  He drinks some juice and sat around in bed but the pain just kept getting worse and worse.  The patient states he could not stand anymore so he decided to come to the hospital for evaluation.  The patient has a history of pancreatitis which he had about 25 years ago.  He rates his pain currently a 0 out of 10 in intensity.  He started having intermittent pains about 2 weeks ago and had an ultrasound recently with his primary care physician.  He had a fever last week but was placed on doxycycline by his primary care physician.  The patient is here today for evaluation.   Past Medical History:  Diagnosis Date  . Anxiety   . Arthritis   . History of actinic keratoses   . Hyperglycemia     Patient Active Problem List   Diagnosis Date Noted  . Cholecystitis 06/02/2017  . Choledocholithiasis with acute cholecystitis   . 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 Interstitial cystitis 04/06/2015    Past Surgical History:  Procedure Laterality Date  . ANTERIOR LAT LUMBAR FUSION Right 07/30/2016   Procedure: Right Lateral two-three Lateral transpsoas interbody fusion with lateral plating/Minimally invasive decompression at Lumbar two-three;  Surgeon: Ditty, Kevan Ny, MD;  Location: [place];  Service: Neurosurgery;  Laterality: Right;  . BACK SURGERY  2012  . CATARACT  EXTRACTION, BILATERAL    . COLONOSCOPY W/ POLYPECTOMY    . NECK SURGERY  2000  . TONSILLECTOMY AND ADENOIDECTOMY      Prior to Admission medications   Medication Sig Start Date End Date Taking? Authorizing Provider  imipramine (TOFRANIL) 50 MG tablet Take 50 mg by mouth at bedtime.    Yes [provider]  pentosan polysulfate (ELMIRON) 100 MG capsule Take 100 mg by mouth 3 (three) times daily.    Yes [provider]  pravastatin (PRAVACHOL) 20 MG tablet Take 1 tablet (20 mg total) by mouth daily. 05/02/17  Yes Carmon Ginsberg, PA  tamsulosin (FLOMAX) 0.4 MG CAPS capsule Take 0.4 mg by mouth daily after supper.    Yes [provider]  naproxen sodium (ANAPROX) 220 MG tablet Take 220 mg by mouth 2 (two) times daily with a meal.    [provider]    Allergies Amoxicillin  Family History  Problem Relation Age of Onset  . Cancer Mother        brain    Social History Social History   Tobacco Use  . Smoking status: Never Smoker  . Smokeless tobacco: Never Used  Substance Use Topics  . Alcohol use: No    Alcohol/week: 0.0 oz  . Drug use: No    Review of Systems  Constitutional:  fever Eyes: No visual changes. ENT: No sore throat. Cardiovascular: Denies chest pain. Respiratory: Denies shortness of breath. Gastrointestinal:  abdominal pain.  No  nausea, no vomiting.  No diarrhea.  No constipation. Genitourinary: Negative for dysuria. Musculoskeletal: Negative for back pain. Skin: Negative for rash. Neurological: Negative for headaches, focal weakness or numbness.   ____________________________________________   PHYSICAL EXAM:  VITAL SIGNS: ED Triage Vitals  Enc Vitals Group     BP 06/01/17 2356 (!) 145/73     Pulse Rate 06/01/17 2356 (!) 109     Resp 06/01/17 2356 18     Temp 06/01/17 2356 97.7 F (36.5 C)     Temp Source 06/01/17 2356 Oral     SpO2 06/01/17 2356 97 %     Weight 06/01/17 2356 190 lb (86.2 kg)     Height  06/01/17 2356 '5\' 11"'  (1.803 m)     Head Circumference --      Peak Flow --      Pain Score 06/02/17 0008 10     Pain Loc --      Pain Edu? --      Excl. in [place]? --     Constitutional: Alert and oriented. Well appearing and in mild distress. Eyes: Conjunctivae are normal. PERRL. EOMI. Head: Atraumatic. Nose: No congestion/rhinnorhea. Mouth/Throat: Mucous membranes are moist.  Oropharynx non-erythematous. Cardiovascular: Normal rate, regular rhythm. Grossly normal heart sounds.  Good peripheral circulation. Respiratory: Normal respiratory effort.  No retractions. Lungs CTAB. Gastrointestinal: Soft and nontender. No distention.  Positive bowel sounds Musculoskeletal: No lower extremity tenderness nor edema.   Neurologic:  Normal speech and language.  Skin:  Skin is warm, dry and intact.  Psychiatric: Mood and affect are normal.  ____________________________________________   LABS (all labs ordered are listed, but only abnormal results are displayed)  Labs Reviewed  LIPASE, BLOOD - Abnormal; Notable for the following components:      Result Value   Lipase 296 (*)    All other components within normal limits  COMPREHENSIVE METABOLIC PANEL - Abnormal; Notable for the following components:   Chloride 100 (*)    Glucose, Bld 169 (*)    Creatinine, Ser 1.25 (*)    Total Protein 9.2 (*)    AST 413 (*)    ALT 290 (*)    Alkaline Phosphatase 361 (*)    Total Bilirubin 2.2 (*)    GFR calc non Af Amer 56 (*)    All other components within normal limits  CBC - Abnormal; Notable for the following components:   WBC 25.1 (*)    Platelets 491 (*)    All other components within normal limits  URINALYSIS, COMPLETE (UACMP) WITH MICROSCOPIC - Abnormal; Notable for the following components:   Color, Urine AMBER (*)    APPearance CLEAR (*)    Bacteria, UA RARE (*)    Squamous Epithelial / LPF 0-5 (*)    All other components within normal limits  CULTURE, BLOOD (ROUTINE X 2)  CULTURE,  BLOOD (ROUTINE X 2)  PROTIME-INR  HEPATITIS PANEL, ACUTE   ____________________________________________  EKG  ED ECG REPORT I, Loney Hering, the attending physician, personally viewed and interpreted this ECG.   Date: 06/02/2017  EKG Time: 0001  Rate: 101  Rhythm: sinus tachycardia  Axis: left axis deviation  Intervals:none  ST&T Change: none  ____________________________________________  RADIOLOGY  ED MD interpretation:    This is a 72 year old male who comes into the hospital today with some [place] abd RUQ: Sonographic findings of cholecystitis without sonographic Murphy sign, common bile duct 7 mm, sludge in the gallbladder and gallbladder wall thickening.  Official  radiology report(s): US Abdomen Limited Ruq  Result Date: 06/02/2017 CLINICAL DATA:  Intermittent RIGHT upper quadrant abdominal pain for 2 weeks, worsening today. History of gallstones. EXAM: ULTRASOUND ABDOMEN LIMITED RIGHT UPPER QUADRANT COMPARISON:  Abdominal ultrasound May 28, 2017. FINDINGS: Gallbladder: Gallbladder wall is thickened at 5 mm. Sludge ball. 5 mm echogenic mural based polyp versus gallstone. Pericholecystic fluid. No sonographic Murphy's sign elicited. Common bile duct: Diameter: 7 mm.  No choledocholithiasis. Liver: 15 mm echogenic focus adjacent to gallbladder, possible hemangioma. Normal limits in parenchymal echogenicity. Portal vein is patent on color Doppler imaging with normal direction of blood flow towards the liver. IMPRESSION: Stable examination: Sonographic findings of cholecystitis without sonographic Murphy sign to confirm acute component. Electronically Signed   By: Elon Alas M.D.   On: 06/02/2017 03:25    ____________________________________________   PROCEDURES  Procedure(s) performed: None  Procedures  Critical Care performed: No  ____________________________________________   INITIAL IMPRESSION / ASSESSMENT AND PLAN / ED COURSE  As part of my medical  decision making, I reviewed the following data within the electronic MEDICAL RECORD NUMBER Notes from prior ED visits and [place] Controlled Substance Database   This is a 72 year old male who comes into the hospital today with some epigastric and right upper quadrant abdominal pain.  My differential diagnosis includes pancreatitis, acute coronary syndrome, cholecystitis, gastritis.   We did check some blood work on the patient.  The patient's white blood cell count was elevated at 25,000.  The patient's liver enzymes are also elevated with an AST of 413 and ALT of 290.  The patient's alk phos is elevated at 361 and the bili is 2.2.  The patient's lipase is also 296 indicating pancreatitis.  I did contact surgery given the concern for cholecystitis as well as elevated white blood cell count and liver enzymes.  Dr. Dahlia Byes the surgeon on call was concern for choledocholithiasis.  He recommends admitting the patient to the medicine service for an ERCP and he will consult on the patient for cholecystectomy.  The patient was feeling improved after the dose of morphine.  I did give him a dose of ciprofloxacin and Flagyl and he will be admitted to the hospitalist service.  I did discuss the case with the hospitalist on-call overnight.       ____________________________________________   FINAL CLINICAL IMPRESSION(S) / ED DIAGNOSES  Final diagnoses:  Upper abdominal pain  Choledocholithiasis with acute cholecystitis     ED Discharge Orders    None       Note:  This document was prepared using Dragon voice recognition software and may include unintentional dictation errors.    Loney Hering, MD 06/02/17 [phone]

## 2017-06-02 NOTE — ED Notes (Addendum)
Walking by subwait area and pt stops me to ask for more pain medication; currently pain is down to 5/10 after Morphine given; pain was 10/10; pt understands it's only been 45 minutes since he got pain medication and will have to wait a little longer

## 2017-06-02 NOTE — Progress Notes (Addendum)
Addendum:  MRCP showed gallbladder wall thickening with some sludge without definite gallstone.  There is a 5.3 x 5.1 x 6.6 cm homogeneous mass with possible origin being distal esophagus or GE junction.  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  There is no significant pancreatic edema and there is no choledocholithiasis.  It is unclear at this point why his LFTs were elevated.  He could have passes a stone.  There is no clear evidence of cholecystitis either.  Cholecystitis alone would not explain a WBC of 25.  This upper abdominal mass would require evaluation via EUS.  Discussed case with Dr. Allen Norris and he recommended ordering HIDA scan for further evaluation of his gallbladder, as well as outpatient workup for this abdominal mass with EUS.  Olean Ree, MD    06/02/2017  Subjective: Patient admitted overnight with acute onset of epigastric abdominal pain yesterday afternoon.  Denies having right upper quadrant pain, but mainly epigastric.  Today feels better.  Denies any nausea or vomiting.  He's ordered for MRCP today for further evaluation given elevated LFTs as well as lipase.  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  Vital signs: Temp:  [97.7 F (36.5 C)-99 F (37.2 C)] 99 F (37.2 C) (04/01 0535) Pulse Rate:  [95-115] 95 (04/01 0535) Resp:  [16-18] 16 (04/01 0535) BP: (114-145)/(62-73) 139/73 (04/01 0535) SpO2:  [95 %-100 %] 96 % (04/01 0535) Weight:  [190 lb (86.2 kg)-192 lb 3.9 oz (87.2 kg)] 192 lb 3.9 oz (87.2 kg) (04/01 0500)   Intake/Output: 03/31 0701 - 04/01 0700 In: 1408 [I.V.:142; IV Piggyback:1266] Out: 150 [Urine:150] Last BM Date: 05/31/17  Physical Exam: Constitutional: No acute distress Abdomen:  Soft, nondistended, with some  tenderness to palpation over epigastric area.  Negative Murphy's sign.  Labs:  Recent Labs    06/02/17 0017  WBC 25.1*  HGB 15.8  HCT 47.2  PLT 491*   Recent Labs    06/02/17 0017  NA 135  K 4.1  CL 100*  CO2 25  GLUCOSE 169*  BUN 14  CREATININE 1.25*  CALCIUM 9.2   Recent Labs    06/02/17 0402  LABPROT 15.2  INR 1.21    Imaging: US Abdomen Limited Ruq  Result Date: 06/02/2017 CLINICAL DATA:  Intermittent RIGHT upper quadrant abdominal pain for 2 weeks, worsening today. History of gallstones. EXAM: ULTRASOUND ABDOMEN LIMITED RIGHT UPPER QUADRANT COMPARISON:  Abdominal ultrasound May 28, 2017. FINDINGS: Gallbladder: Gallbladder wall is thickened at 5 mm. Sludge ball. 5 mm echogenic mural based polyp versus gallstone. Pericholecystic fluid. No sonographic Murphy's sign elicited. Common bile duct: Diameter: 7 mm.  No choledocholithiasis. Liver: 15 mm echogenic focus adjacent to gallbladder, possible hemangioma. Normal limits in parenchymal echogenicity. Portal vein is patent on color Doppler imaging with normal direction of blood flow towards the liver. IMPRESSION: Stable examination: Sonographic findings of cholecystitis without sonographic Murphy sign to confirm acute component. Electronically Signed   By: Elon Alas M.D.   On: 06/02/2017 03:25    Assessment/Plan: 72 yo male with possible gallstone pancreatitis vs cholecystitis vs recurrent pancreatic head cyst  --Follow up MRCP today. --GI consult pending --Will continue to follow along.  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  Unclear of its significance, but may be a factor in his symptoms.   Melvyn Neth, [place]

## 2017-06-02 NOTE — ED Notes (Signed)
Patient transported to Ultrasound 

## 2017-06-02 NOTE — Progress Notes (Signed)
The patient has no complaints of abdominal pain. His vital signs are stable, physical examinations is unremarkable. Continue Rocephin IV, follow-up HIDA scan, may need a cholecystectomy per Dr. Dahlia Byes.  Discussed with the patient's son, Dr. Allen Norris, RN and CM.

## 2017-06-02 NOTE — H&P (Signed)
[facility] NAME: Rodney Hall    MR#:  [id number]  DATE OF BIRTH:  12/11/1945  DATE OF ADMISSION:  06/02/2017  PRIMARY CARE PHYSICIAN: Carmon Ginsberg, PA   REQUESTING/REFERRING PHYSICIAN:   CHIEF COMPLAINT:   Chief Complaint  Patient presents with  . Abdominal Pain    HISTORY OF PRESENT ILLNESS: Rodney Hall  is a 72 y.o. 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, interstitial cystitis-on doxycycline, presenting with intermittent worsening epigastric pain for 2 weeks radiating into his back/shoulder blades, worse with eating, had ultrasound done a week ago which was noticed for pancreatic cyst, patient developed acute pain once again that was 10 out of 10 that started yesterday evening after eating cake, made better with pain medicine received in the emergency room, workup in the emergency room noted for white count of 25,000, creatinine 1.2, lipase 296, AST 290, total bili 2.2, abdominal ultrasound noted for gallbladder wall thickening concerning for acute cholecystitis/common bile duct dilatation of 7 mm, patient seen in the emergency room with wife at bedside, patient in no apparent distress, denies pain, during this 2-week.  Patient has had intermittent fevers subjectively, chills, night sweats, nausea, per ED attending-General surgery recommended gastroenterology consultation for ERCP possibly, patient is now been admitted for acute probable acalculous cholecystitis with transaminitis.  PAST MEDICAL HISTORY:   Past Medical History:  Diagnosis Date  . Anxiety   . Arthritis   . History of actinic keratoses   . Hyperglycemia     PAST SURGICAL HISTORY:  Past Surgical History:  Procedure Laterality Date  . ANTERIOR LAT LUMBAR FUSION Right 07/30/2016   Procedure: Right Lateral two-three Lateral transpsoas interbody fusion with lateral plating/Minimally  invasive decompression at Lumbar two-three;  Surgeon: Ditty, Kevan Ny, MD;  Location: [place];  Service: Neurosurgery;  Laterality: Right;  . BACK SURGERY  2012  . CATARACT EXTRACTION, BILATERAL    . COLONOSCOPY W/ POLYPECTOMY    . NECK SURGERY  2000  . TONSILLECTOMY AND ADENOIDECTOMY      SOCIAL HISTORY:  Social History   Tobacco Use  . Smoking status: Never Smoker  . Smokeless tobacco: Never Used  Substance Use Topics  . Alcohol use: No    Alcohol/week: 0.0 oz    FAMILY HISTORY:  Family History  Problem Relation Age of Onset  . Cancer Mother        brain    DRUG ALLERGIES:  Allergies  Allergen Reactions  . Amoxicillin Rash    Has patient had a PCN reaction causing immediate rash, facial/tongue/throat swelling, SOB or lightheadedness with hypotension: Yes Has patient had a PCN reaction causing severe rash involving mucus membranes or skin necrosis: No Has patient had a PCN reaction that required hospitalization No Has patient had a PCN reaction occurring within the last 10 years: No If all of the above answers are "NO", then may proceed with Cephalosporin use.      REVIEW OF SYSTEMS:   CONSTITUTIONAL: + fever,no fatigue or weakness.  EYES: No blurred or double vision.  EARS, NOSE, AND THROAT: No tinnitus or ear pain.  RESPIRATORY: No cough, shortness of breath, wheezing or hemoptysis.  CARDIOVASCULAR: No chest pain, orthopnea, edema.  GASTROINTESTINAL: + nausea, no vomiting, diarrhea, + abdominal pain.  GENITOURINARY: No dysuria, hematuria.  ENDOCRINE: No polyuria, nocturia,  HEMATOLOGY: No anemia, easy bruising or bleeding SKIN: No rash or  lesion. MUSCULOSKELETAL: No joint pain or arthritis.   NEUROLOGIC: No tingling, numbness, weakness.  PSYCHIATRY: No anxiety or depression.   MEDICATIONS AT HOME:  Prior to Admission medications   Medication Sig Start Date End Date Taking? Authorizing Provider  imipramine (TOFRANIL) 50 MG tablet Take 50 mg by mouth at  bedtime.    Yes [provider]  naproxen sodium (ANAPROX) 220 MG tablet Take 220 mg by mouth 2 (two) times daily with a meal.   Yes [provider]  pentosan polysulfate (ELMIRON) 100 MG capsule Take 100 mg by mouth 3 (three) times daily.    Yes [provider]  pravastatin (PRAVACHOL) 20 MG tablet Take 1 tablet (20 mg total) by mouth daily. 05/02/17  Yes Carmon Ginsberg, PA  tamsulosin (FLOMAX) 0.4 MG CAPS capsule Take 0.4 mg by mouth daily after supper.    Yes [provider]      PHYSICAL EXAMINATION:   VITAL SIGNS: Blood pressure 134/62, pulse (!) 115, temperature 97.7 F (36.5 C), temperature source Oral, resp. rate 18, height 5\' 11"  (1.803 m), weight 86.2 kg (190 lb), SpO2 100 %.  GENERAL:  72 y.o.-year-old patient lying in the bed with no acute distress.  EYES: Pupils equal, round, reactive to light and accommodation. No scleral icterus. Extraocular muscles intact.  HEENT: Head atraumatic, normocephalic. Oropharynx and nasopharynx clear.  NECK:  Supple, no jugular venous distention. No thyroid enlargement, no tenderness.  LUNGS: Normal breath sounds bilaterally, no wheezing, rales,rhonchi or crepitation. No use of accessory muscles of respiration.  CARDIOVASCULAR: S1, S2 normal. No murmurs, rubs, or gallops.  ABDOMEN: Soft, nontender, nondistended. Bowel sounds present. No organomegaly or mass.  EXTREMITIES: No pedal edema, cyanosis, or clubbing.  NEUROLOGIC: Cranial nerves II through XII are intact. MAES. Gait not checked.  PSYCHIATRIC: The patient is alert and oriented x 3.  SKIN: No obvious rash, lesion, or ulcer.   LABORATORY PANEL:   CBC Recent Labs  Lab 06/02/17 0017  WBC 25.1*  HGB 15.8  HCT 47.2  PLT 491*  MCV 89.8  MCH 29.9  MCHC 33.4  RDW 13.3   ------------------------------------------------------------------------------------------------------------------  Chemistries  Recent Labs  Lab 06/02/17 0017  NA 135  K 4.1   CL 100*  CO2 25  GLUCOSE 169*  BUN 14  CREATININE 1.25*  CALCIUM 9.2  AST 413*  ALT 290*  ALKPHOS 361*  BILITOT 2.2*   ------------------------------------------------------------------------------------------------------------------ estimated creatinine clearance is 57.7 mL/min (A) (by C-G formula based on SCr of 1.25 mg/dL (H)). ------------------------------------------------------------------------------------------------------------------ No results for input(s): TSH, T4TOTAL, T3FREE, THYROIDAB in the last 72 hours.  Invalid input(s): FREET3   Coagulation profile No results for input(s): INR, PROTIME in the last 168 hours. ------------------------------------------------------------------------------------------------------------------- No results for input(s): DDIMER in the last 72 hours. -------------------------------------------------------------------------------------------------------------------  Cardiac Enzymes No results for input(s): CKMB, TROPONINI, MYOGLOBIN in the last 168 hours.  Invalid input(s): CK ------------------------------------------------------------------------------------------------------------------ Invalid input(s): POCBNP  ---------------------------------------------------------------------------------------------------------------  Urinalysis    Component Value Date/Time   BILIRUBINUR Small 05/26/2017 1000   PROTEINUR 30++ 05/26/2017 1000   UROBILINOGEN 0.2 05/26/2017 1000   NITRITE Neg 05/26/2017 1000   LEUKOCYTESUR Negative 05/26/2017 1000     RADIOLOGY: US Abdomen Limited Ruq  Result Date: 06/02/2017 CLINICAL DATA:  Intermittent RIGHT upper quadrant abdominal pain for 2 weeks, worsening today. History of gallstones. EXAM: ULTRASOUND ABDOMEN LIMITED RIGHT UPPER QUADRANT COMPARISON:  Abdominal ultrasound May 28, 2017. FINDINGS: Gallbladder: Gallbladder wall is thickened at 5 mm. Sludge ball. 5 mm echogenic mural based polyp  versus gallstone. Pericholecystic fluid. No sonographic Murphy's sign elicited. Common bile duct: Diameter: 7 mm.  No choledocholithiasis. Liver: 15 mm echogenic focus adjacent to gallbladder, possible hemangioma. Normal limits in parenchymal echogenicity. Portal vein is patent on color Doppler imaging with normal direction of blood flow towards the liver. IMPRESSION: Stable examination: Sonographic findings of cholecystitis without sonographic Murphy sign to confirm acute component. Electronically Signed   By: Elon Alas M.D.   On: 06/02/2017 03:25    EKG: Orders placed or performed during the hospital encounter of 06/02/17  . EKG 12-Lead  . EKG 12-Lead  . ED EKG  . ED EKG    IMPRESSION AND PLAN: 1 acute probable acalculous cholecystitis with acute transaminitis/pancreatitis Abdominal exam is benign, ultrasound abdomen noted, white count 25,000 C/S surgery for expert opinion, GI to see per ED attending for possible ERCP, check HIDA scan, adult pain protocol, avoid hepatotoxic agents, cmp daily, check hepatitis panel, PT/INR  2 acute Interstitial cystitis Continue doxycyline bid F/U with PCP s/p discharge for continued management  3 chronic BPH Stable Continue flomax  4 acute leukocytosis Most likely due to above CBC daily, check blood cultures, and continue close monitoring  All the records are reviewed and case discussed with ED provider. Management plans discussed with the patient, family and they are in agreement.  CODE STATUS:full Code Status History    Date Active Date Inactive Code Status Order ID Comments User Context   07/30/2016 [id number] 07/31/2016 1836 Full Code [id number]  Traci Sermon, PA-C Inpatient       TOTAL TIME TAKING CARE OF THIS PATIENT: 45 minutes.    Avel Peace Tomesha Sargent M.D on 06/02/2017   Between 7am to 6pm - Pager - [phone]  After 6pm go to [email or web address] - password [email or web address] [facility]  Office   [phone]  CC: Primary care physician; Carmon Ginsberg, PA   Note: This dictation was prepared with Dragon dictation along with smaller phrase technology. Any transcriptional errors that result from this process are unintentional.

## 2017-06-02 NOTE — Consult Note (Signed)
Pharmacy Antibiotic Note  Rodney Hall. is a 72 y.o. male admitted on 06/02/2017 with  Intra-abdominal Infection .  Pharmacy has been consulted for Ceftriaxone dosing.  Plan: Start ceftriaxone 2g IV every 24 hours.   Height: 5\' 11"  (180.3 cm) Weight: 192 lb 3.9 oz (87.2 kg) IBW/kg (Calculated) : 75.3  Temp (24hrs), Avg:98.4 F (36.9 C), Min:97.7 F (36.5 C), Max:99 F (37.2 C)  Recent Labs  Lab 06/02/17 0017  WBC 25.1*  CREATININE 1.25*    Estimated Creatinine Clearance: 57.7 mL/min (A) (by C-G formula based on SCr of 1.25 mg/dL (H)).    Allergies  Allergen Reactions  . Amoxicillin Rash    Has patient had a PCN reaction causing immediate rash, facial/tongue/throat swelling, SOB or lightheadedness with hypotension: Yes Has patient had a PCN reaction causing severe rash involving mucus membranes or skin necrosis: No Has patient had a PCN reaction that required hospitalization No Has patient had a PCN reaction occurring within the last 10 years: No If all of the above answers are "NO", then may proceed with Cephalosporin use.      Antimicrobials this admission: 4/1 ceftriaxone >>  4/1 Flagyl>>   Thank you for allowing pharmacy to be a part of this patient's care.  Pernell Dupre, PharmD, BCPS Clinical Pharmacist 06/02/2017 9:02 AM

## 2017-06-02 NOTE — ED Triage Notes (Signed)
Pt reports epigastric pain intermittently for a couple of weeks; tonight is the worst pain he's had; pain radiates through to his back; had a [place] this week and was diagnosed with pancreatic mass; [place] results present in computer and also report gallbladder wall thickening and sludge; pt says pain worsened around 5pm; no N/V; pt restless in triage;

## 2017-06-02 NOTE — Consult Note (Signed)
Patient ID: Rodney Kindle., male   DOB: [birth year], 72 y.o.   MRN: [id number]  HPI Rodney Ellenwood. is a 72 y.o. male asked to see in consultation by the request of Dr. Dahlia Client ( d/w her in detail).  Patient came in with an acute onset of epigastric pain radiated to his back.  The pain was severe and intermittent.  He describes his pain is sharp on the worse she is ever had in his life.  Of note the patient has had very mild episodes in the past couple months have subsided on its own.  Reports some chills.  No fevers.  No evidence of biliary obstruction.  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  No previous abdominal operations.  He is able to perform more than 4 metastases of activity without any shortness of breath or chest pain.  Has multiple back surgery but does not take any narcotics at this time. Ultrasound personally reviewed there is evidence of gallstone and thickening of the gallbladder wall.  Questionable gallstones.  Common bile duct is mildly enlarged at 7 mm. Laboratory data shows a lipase of 296, total bilirubin of 2.2 alkaline phosphatase of 361.WBC 25k  HPI  Past Medical History:  Diagnosis Date  . Anxiety   . Arthritis   . History of actinic keratoses   . Hyperglycemia     Past Surgical History:  Procedure Laterality Date  . ANTERIOR LAT LUMBAR FUSION Right 07/30/2016   Procedure: Right Lateral two-three Lateral transpsoas interbody fusion with lateral plating/Minimally invasive decompression at Lumbar two-three;  Surgeon: Ditty, Kevan Ny, MD;  Location: [facility];  Service: Neurosurgery;  Laterality: Right;  . BACK SURGERY  2012  . CATARACT EXTRACTION, BILATERAL    . COLONOSCOPY W/ POLYPECTOMY    . NECK SURGERY  2000  . TONSILLECTOMY AND ADENOIDECTOMY      Family History  Problem Relation Age of Onset  . Cancer Mother        brain    Social History Social History   Tobacco Use  .  Smoking status: Never Smoker  . Smokeless tobacco: Never Used  Substance Use Topics  . Alcohol use: No    Alcohol/week: 0.0 oz  . Drug use: No    Allergies  Allergen Reactions  . Amoxicillin Rash    Has patient had a PCN reaction causing immediate rash, facial/tongue/throat swelling, SOB or lightheadedness with hypotension: Yes Has patient had a PCN reaction causing severe rash involving mucus membranes or skin necrosis: No Has patient had a PCN reaction that required hospitalization No Has patient had a PCN reaction occurring within the last 10 years: No If all of the above answers are "NO", then may proceed with Cephalosporin use.      Current Facility-Administered Medications  Medication Dose Route Frequency Provider Last Rate Last Dose  . ciprofloxacin (CIPRO) IVPB 400 mg  400 mg Intravenous Once Loney Hering, MD 200 mL/hr at 06/02/17 0456 400 mg at 06/02/17 0456  . dextrose 5 %-0.45 % sodium chloride infusion   Intravenous Continuous Salary, Montell D, MD      . heparin injection 5,000 Units  5,000 Units Subcutaneous Q8H Salary, Montell D, MD      . HYDROcodone-acetaminophen (NORCO/VICODIN) 5-325 MG per tablet 1-2 tablet  1-2 tablet Oral Q4H PRN Salary, Montell D, MD      . imipramine (TOFRANIL) tablet 50 mg  50 mg Oral QHS Salary, Montell  D, MD      . metroNIDAZOLE (FLAGYL) IVPB 500 mg  500 mg Intravenous Once Loney Hering, MD 100 mL/hr at 06/02/17 0456 500 mg at 06/02/17 0456  . morphine 2 MG/ML injection 2 mg  2 mg Intravenous Q2H PRN Salary, Montell D, MD      . ondansetron (ZOFRAN) tablet 4 mg  4 mg Oral Q6H PRN Salary, Montell D, MD       Or  . ondansetron (ZOFRAN) injection 4 mg  4 mg Intravenous Q6H PRN Salary, Montell D, MD      . polyethylene glycol (MIRALAX / GLYCOLAX) packet 17 g  17 g Oral Daily PRN Salary, Montell D, MD      . sodium chloride 0.9 % bolus 1,000 mL  1,000 mL Intravenous Once Pabon, Diego F, MD      . tamsulosin (FLOMAX) capsule 0.4 mg   0.4 mg Oral QPC supper Salary, Montell D, MD      . traZODone (DESYREL) tablet 50 mg  50 mg Oral QHS PRN Salary, Avel Peace, MD         Review of Systems Full ROS  was asked and was negative except for the information on the HPI  Physical Exam Blood pressure 126/68, pulse 99, temperature 97.7 F (36.5 C), temperature source Oral, resp. rate 16, height 5\' 11"  (1.803 m), weight 87.2 kg (192 lb 3.9 oz), SpO2 95 %. CONSTITUTIONAL: NAD EYES: Pupils are equal, round, and reactive to light, Sclera are non-icteric. EARS, NOSE, MOUTH AND THROAT: The oropharynx is clear. The oral mucosa is pink and moist. Hearing is intact to voice. LYMPH NODES:  Lymph nodes in the neck are normal. RESPIRATORY:  Lungs are clear. There is normal respiratory effort, with equal breath sounds bilaterally, and without pathologic use of accessory muscles. CARDIOVASCULAR: Heart is regular without murmurs, gallops, or rubs. GI: The abdomen is soft, mild tenderness to palpation in the epigastric area.  No  Murphy sign.  No peritonitis.  Small reducible umbilical hernia GU: Rectal deferred.   MUSCULOSKELETAL: Normal muscle strength and tone. No cyanosis or edema.   SKIN: Turgor is good and there are no pathologic skin lesions or ulcers. NEUROLOGIC: Motor and sensation is grossly normal. Cranial nerves are grossly intact. PSYCH:  Oriented to person, place and time. Affect is normal.  Data Reviewed  I have personally reviewed the patient's imaging, laboratory findings and medical records.    Assessment/ Plan Gallstone pancreatitis versus acute cholecystitis in a patient with a prior history of pancreatic cyst.  Does have increasing LFTs and total bilirubin suggesting an obstructive component of his common bile duct.  First order of business is to determine if his gout any biliary obstruction.  I will order an MRCP.  We will also trend LFTs and I will order a fractionated bilirubin for tomorrow.  3 with antibiotic management,  n.p.o. and IV fluids.  I will cancel HIDA scan as this will not change anything in his management.  At some point in time he will need cholecystectomy but obviously this is after we assured that his common bile duct is patent and there are no other major pancreatic pathology.  We will continue to follow along with you.  GI consultation also recommended.  No need for emergent surgical intervention at this time.  Please note that I have provided extensive counseling to the patient and all questions were answered  Caroleen Hamman, MD FACS General Surgeon 06/02/2017, 5:27 AM

## 2017-06-03 DIAGNOSIS — R1010 Upper abdominal pain, unspecified: Secondary | ICD-10-CM

## 2017-06-03 LAB — BILIRUBIN, DIRECT: Bilirubin, Direct: 2.6 mg/dL — ABNORMAL HIGH (ref 0.1–0.5)

## 2017-06-03 LAB — COMPREHENSIVE METABOLIC PANEL WITH GFR
ALT: 245 U/L — ABNORMAL HIGH (ref 17–63)
AST: 183 U/L — ABNORMAL HIGH (ref 15–41)
Albumin: 3 g/dL — ABNORMAL LOW (ref 3.5–5.0)
Alkaline Phosphatase: 298 U/L — ABNORMAL HIGH (ref 38–126)
Anion gap: 7 (ref 5–15)
BUN: 10 mg/dL (ref 6–20)
CO2: 24 mmol/L (ref 22–32)
Calcium: 8.6 mg/dL — ABNORMAL LOW (ref 8.9–10.3)
Chloride: 105 mmol/L (ref 101–111)
Creatinine, Ser: 1.26 mg/dL — ABNORMAL HIGH (ref 0.61–1.24)
GFR calc Af Amer: >60 mL/min
GFR calc non Af Amer: 56 mL/min — ABNORMAL LOW
Glucose, Bld: 134 mg/dL — ABNORMAL HIGH (ref 65–99)
Potassium: 4.2 mmol/L (ref 3.5–5.1)
Sodium: 136 mmol/L (ref 135–145)
Total Bilirubin: 4 mg/dL — ABNORMAL HIGH (ref 0.3–1.2)
Total Protein: 7 g/dL (ref 6.5–8.1)

## 2017-06-03 LAB — HEPATITIS PANEL, ACUTE
HCV Ab: <0.1 {s_co_ratio} (ref 0.0–0.9)
Hep A IgM: NEGATIVE
Hep B C IgM: NEGATIVE
Hepatitis B Surface Ag: NEGATIVE

## 2017-06-03 LAB — CBC
HCT: 38.2 % — ABNORMAL LOW (ref 40.0–52.0)
Hemoglobin: 12.7 g/dL — ABNORMAL LOW (ref 13.0–18.0)
MCH: 30.5 pg (ref 26.0–34.0)
MCHC: 33.4 g/dL (ref 32.0–36.0)
MCV: 91.4 fL (ref 80.0–100.0)
Platelets: 340 10*3/uL (ref 150–440)
RBC: 4.18 MIL/uL — ABNORMAL LOW (ref 4.40–5.90)
RDW: 13.4 % (ref 11.5–14.5)
WBC: 9.8 10*3/uL (ref 3.8–10.6)

## 2017-06-03 LAB — LIPASE, BLOOD: Lipase: 44 U/L (ref 11–51)

## 2017-06-03 NOTE — Progress Notes (Signed)
Lucilla Lame, MD [facility]   [address], [place], [place] [address] Phone: [phone] Fax : [phone]   Subjective: The patient reports that he has not had any further abdominal pain.  The patient had a HIDA scan that did not show any uptake of the tracer by the liver.  This is indicative of hepatocellular disease.  The patient's lipase is come back down to normal.  His liver enzymes also had come down except for the bilirubin that went up.  The patient had an ultrasound that showed him to have a pancreatic cyst with a follow-up MRI that showed the 6.6 cm lesion to be more suggestive of a distal esophageal/esophageal gastric region.    Objective: Vital signs in last 24 hours: Vitals:   06/02/17 1300 06/02/17 2059 06/03/17 0554 06/03/17 1338  BP: 127/74 (!) 141/75 (!) 153/86 (!) 149/88  Pulse: 94 77 82 71  Resp: 17 18 20    Temp: 98.8 F (37.1 C) 98.8 F (37.1 C) 97.7 F (36.5 C) 98.1 F (36.7 C)  TempSrc: Oral Oral Oral Oral  SpO2: 97% 98% 97% 100%  Weight:      Height:       Weight change:   Intake/Output Summary (Last 24 hours) at 06/03/2017 1721 Last data filed at 06/03/2017 1315 Gross per 24 hour  Intake 1856 ml  Output 850 ml  Net 1006 ml     Exam: General: Alert and oriented x3 no apparent distress Extremities without cyanosis clubbing or edema Neurological grossly intact   Lab Results: @LABTEST2 @ Micro Results: Recent Results (from the past 240 hour(s))  CULTURE, URINE COMPREHENSIVE     Status: None   Collection Time: 05/26/17 10:08 AM  Result Value Ref Range Status   Urine Culture, Comprehensive Final report  Final   Organism ID, Bacteria Comment  Final    Comment: No growth in 36 - 48 hours.  CULTURE, BLOOD (ROUTINE X 2) w Reflex to ID Panel     Status: None (Preliminary result)   Collection Time: 06/02/17  5:19 AM  Result Value Ref Range Status   Specimen Description BLOOD LEFT ANTECUBITAL  Final   Special Requests   Final    BOTTLES DRAWN  AEROBIC AND ANAEROBIC Blood Culture adequate volume   Culture   Final    NO GROWTH 1 DAY Performed at [facility], [address], [place], [place] [address]    Report Status PENDING  Incomplete  CULTURE, BLOOD (ROUTINE X 2) w Reflex to ID Panel     Status: None (Preliminary result)   Collection Time: 06/02/17  5:24 AM  Result Value Ref Range Status   Specimen Description BLOOD LEFT HAND  Final   Special Requests   Final    BOTTLES DRAWN AEROBIC AND ANAEROBIC Blood Culture adequate volume   Culture   Final    NO GROWTH 1 DAY Performed at [facility], [address] [place], [place] [address]    Report Status PENDING  Incomplete   Studies/Results: Mr 3d Recon At Scanner  Result Date: 06/02/2017 CLINICAL DATA:  Acute onset epigastric pain with elevated LFTs and lipase level. Possible gallstones on ultrasound. EXAM: MRI ABDOMEN WITHOUT AND WITH CONTRAST (INCLUDING MRCP) TECHNIQUE: Multiplanar multisequence MR imaging of the abdomen was performed both before and after the administration of intravenous contrast. Heavily T2-weighted images of the biliary and pancreatic ducts were obtained, and three-dimensional MRCP images were rendered by post processing. CONTRAST:  32mL MULTIHANCE GADOBENATE DIMEGLUMINE 529 MG/ML  IV SOLN COMPARISON:  Ultrasound exam from earlier the same day. FINDINGS: Lower chest: Trace dependent atelectasis.  Otherwise unremarkable. Hepatobiliary: Liver measures 20.8 cm craniocaudal length. No focal abnormality within the liver parenchyma. Gallbladder shows diffuse wall thickening as noted on recent ultrasound exam. There is some sludge in the neck of the gallbladder without a definite gallstone evident. There is no intra or extrahepatic biliary duct dilatation. No evidence for choledocholithiasis. Pancreas: No focal mass lesion. No dilatation of the main duct. No intraparenchymal cyst. No peripancreatic edema. Spleen:  No splenomegaly. No focal mass lesion.  Adrenals/Urinary Tract: No adrenal nodule or mass. Right kidney unremarkable. 6 mm simple cyst noted in the interpolar left kidney with another 2.1 cm cyst in the lateral aspect of the left kidney. Stomach/Bowel: 5.3 x 5.1 x 6.6 cm well-defined homogeneous mass is identified in the upper abdomen, anterior to the esophagogastric junction and displacing the lateral segment of the left liver anteriorly and the mid stomach laterally. Postcontrast T1 weighted imaging shows preservation of the fat plane between the lesion and the left liver. Coronal post-contrast imaging suggests that the lesion arises from the distal esophagus or proximal stomach in the region of the esophagogastric junction (see image 47 of coronal postcontrast series 25). The lesion has intermediate signal intensity on both T1 and T2 weighted imaging. No discernible enhancement within the lesion after IV contrast administration. Stomach is otherwise unremarkable. Duodenum is normally positioned as is the ligament of Treitz. No small bowel or colonic dilatation within the visualized abdomen. Vascular/Lymphatic: No abdominal aortic aneurysm. Portal vein, superior mesenteric vein, and splenic vein are patent. Celiac axis and SMA enhance after IV contrast administration. No abdominal lymphadenopathy Other:  No intraperitoneal free fluid. Musculoskeletal: Susceptibility artifact from lumbar fusion hardware evident. No abnormal marrow enhancement within the visualized bony anatomy. IMPRESSION: 1. 6.6 cm well defined, homogeneous, nonenhancing mass is identified anterior to the esophagogastric junction, generating mass-effect on the left liver and proximal/mid stomach. Lesion does not appear to be hepatic in origin, but coronal imaging suggests origin from distal esophagus/esophagogastric junction. 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 Esophageal or gastric  duplication cyst would be a consideration. Given the marked homogeneity of the lesion and lack of discernible enhancement, GI stromal tumor considered less likely. Upper endoscopy with endoscopic ultrasound should be considered. 2. Diffuse gallbladder wall thickening with dependent sludge in the gallbladder lumen. No definite gallstones identified. There is no intra or extrahepatic biliary duct dilatation and no MR findings of choledocholithiasis. 3. Normal MR appearance of the pancreas with no ductal dilatation, mass lesion, or peripancreatic edema/inflammation. Pancreatic parenchyma enhances throughout. Electronically Signed   By: Misty Stanley M.D.   On: 06/02/2017 11:32   Nm Hepato W/eject Fract  Result Date: 06/02/2017 CLINICAL DATA:  Epigastric pain. Gallbladder wall thickening on MRI. Debris versus sludge versus stones within the lumen gallbladder. EXAM: NUCLEAR MEDICINE HEPATOBILIARY IMAGING TECHNIQUE: Sequential images of the abdomen were obtained out to 60 minutes following intravenous administration of radiopharmaceutical. RADIOPHARMACEUTICALS:  5.4 mCi Tc-25m  Choletec IV COMPARISON:  Ultrasound 06/02/2017, MRI 06/02/2017 FINDINGS: Prior radiotracer accumulation within the liver. No counts evident at 1 hour period. At 2 hour interval, no counts within the bowel. The gallbladder does not fill at 2 hours. On the MRI imaging of same day there is no evidence of common bile duct obstruction. No choledocholithiasis or intrahepatic duct dilatation. IMPRESSION: Not excretion of radiotracer into  the common hepatic duct or common bile duct is most consistent with hepatic dysfunction versus extrahepatic ductal obstruction. As there is no evidence obstruction on same day MRI would favor hepatic dysfunction such as hepatitis or cholestasis. Early ductal obstruction could have the similar appearance. Gallbladder cannot be evaluated with out biliary excretion. Cannot exclude acute cholecystitis. These results will  be called to the ordering clinician or representative by the Radiologist Assistant, and communication documented in the PACS or zVision Dashboard. Electronically Signed   By: Suzy Bouchard M.D.   On: 06/02/2017 18:30   Mr Abdomen Mrcp W Wo Contast  Result Date: 06/02/2017 CLINICAL DATA:  Acute onset epigastric pain with elevated LFTs and lipase level. Possible gallstones on ultrasound. EXAM: MRI ABDOMEN WITHOUT AND WITH CONTRAST (INCLUDING MRCP) TECHNIQUE: Multiplanar multisequence MR imaging of the abdomen was performed both before and after the administration of intravenous contrast. Heavily T2-weighted images of the biliary and pancreatic ducts were obtained, and three-dimensional MRCP images were rendered by post processing. CONTRAST:  70mL MULTIHANCE GADOBENATE DIMEGLUMINE 529 MG/ML IV SOLN COMPARISON:  Ultrasound exam from earlier the same day. FINDINGS: Lower chest: Trace dependent atelectasis.  Otherwise unremarkable. Hepatobiliary: Liver measures 20.8 cm craniocaudal length. No focal abnormality within the liver parenchyma. Gallbladder shows diffuse wall thickening as noted on recent ultrasound exam. There is some sludge in the neck of the gallbladder without a definite gallstone evident. There is no intra or extrahepatic biliary duct dilatation. No evidence for choledocholithiasis. Pancreas: No focal mass lesion. No dilatation of the main duct. No intraparenchymal cyst. No peripancreatic edema. Spleen:  No splenomegaly. No focal mass lesion. Adrenals/Urinary Tract: No adrenal nodule or mass. Right kidney unremarkable. 6 mm simple cyst noted in the interpolar left kidney with another 2.1 cm cyst in the lateral aspect of the left kidney. Stomach/Bowel: 5.3 x 5.1 x 6.6 cm well-defined homogeneous mass is identified in the upper abdomen, anterior to the esophagogastric junction and displacing the lateral segment of the left liver anteriorly and the mid stomach laterally. Postcontrast T1 weighted imaging  shows preservation of the fat plane between the lesion and the left liver. Coronal post-contrast imaging suggests that the lesion arises from the distal esophagus or proximal stomach in the region of the esophagogastric junction (see image 47 of coronal postcontrast series 25). The lesion has intermediate signal intensity on both T1 and T2 weighted imaging. No discernible enhancement within the lesion after IV contrast administration. Stomach is otherwise unremarkable. Duodenum is normally positioned as is the ligament of Treitz. No small bowel or colonic dilatation within the visualized abdomen. Vascular/Lymphatic: No abdominal aortic aneurysm. Portal vein, superior mesenteric vein, and splenic vein are patent. Celiac axis and SMA enhance after IV contrast administration. No abdominal lymphadenopathy Other:  No intraperitoneal free fluid. Musculoskeletal: Susceptibility artifact from lumbar fusion hardware evident. No abnormal marrow enhancement within the visualized bony anatomy. IMPRESSION: 1. 6.6 cm well defined, homogeneous, nonenhancing mass is identified anterior to the esophagogastric junction, generating mass-effect on the left liver and proximal/mid stomach. Lesion does not appear to be hepatic in origin, but coronal imaging suggests origin from distal esophagus/esophagogastric junction. 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 Esophageal or gastric duplication cyst would be a consideration. Given the marked homogeneity of the lesion and lack of discernible enhancement, GI stromal tumor considered less likely. Upper endoscopy with endoscopic ultrasound should be considered. 2. Diffuse gallbladder wall thickening with dependent sludge in  the gallbladder lumen. No definite gallstones identified. There is no intra or extrahepatic biliary duct dilatation and no MR findings of choledocholithiasis. 3. Normal MR  appearance of the pancreas with no ductal dilatation, mass lesion, or peripancreatic edema/inflammation. Pancreatic parenchyma enhances throughout. Electronically Signed   By: Misty Stanley M.D.   On: 06/02/2017 11:32   US Abdomen Limited Ruq  Result Date: 06/02/2017 CLINICAL DATA:  Intermittent RIGHT upper quadrant abdominal pain for 2 weeks, worsening today. History of gallstones. EXAM: ULTRASOUND ABDOMEN LIMITED RIGHT UPPER QUADRANT COMPARISON:  Abdominal ultrasound May 28, 2017. FINDINGS: Gallbladder: Gallbladder wall is thickened at 5 mm. Sludge ball. 5 mm echogenic mural based polyp versus gallstone. Pericholecystic fluid. No sonographic Murphy's sign elicited. Common bile duct: Diameter: 7 mm.  No choledocholithiasis. Liver: 15 mm echogenic focus adjacent to gallbladder, possible hemangioma. Normal limits in parenchymal echogenicity. Portal vein is patent on color Doppler imaging with normal direction of blood flow towards the liver. IMPRESSION: Stable examination: Sonographic findings of cholecystitis without sonographic Murphy sign to confirm acute component. Electronically Signed   By: Elon Alas M.D.   On: 06/02/2017 03:25   Medications: I have reviewed the patient's current medications. Scheduled Meds: . heparin  5,000 Units Subcutaneous Q8H  . imipramine  50 mg Oral QHS  . pravastatin  20 mg Oral q1800  . tamsulosin  0.4 mg Oral QPC supper   Continuous Infusions: . cefTRIAXone (ROCEPHIN)  IV Stopped (06/03/17 1049)  . dextrose 5 % and 0.45% NaCl 100 mL/hr at 06/03/17 1311   PRN Meds:.HYDROcodone-acetaminophen, morphine injection, ondansetron **OR** ondansetron (ZOFRAN) IV, polyethylene glycol, traZODone   Assessment: Active Problems:   Cholecystitis   Choledocholithiasis with acute cholecystitis   Upper abdominal pain   Elevated LFTs   Pancreatic cyst    Plan: This patient came in with abdominal pain that has completely resolved.  The patient had diffuse  gallbladder wall thickening and a HIDA scan was done but no uptake of the liver was demonstrated due to hepatitis.  The patient also has a cystic lesion up near the esophagus that was thought to be from the esophageal gastric region but was reported to possibly be from pancreatic origin.  The patient has been told that this will need to be followed up in the future with an endoscopic ultrasound.  The patient will also be started on a regular diet and his liver enzymes have improved except the bilirubin.  We will check the liver enzymes again tomorrow to see if they have further improved.  Since the patient is not having any abdominal pain and if he tolerates a regular diet then he can follow-up as an outpatient.  The patient already has an appoint with Dr. Vicente Males on May 1.   LOS: 1 day   Lucilla Lame 06/03/2017, 5:21 PM

## 2017-06-03 NOTE — Progress Notes (Signed)
06/03/2017  Subjective: Patient reports pain continues to improve.  Denies any nausea or vomiting. MRCP and HIDA scans viewed independently by me and discussed with patient.  Vital signs: Temp:  [97.7 F (36.5 C)-98.8 F (37.1 C)] 97.7 F (36.5 C) (04/02 0554) Pulse Rate:  [77-94] 82 (04/02 0554) Resp:  [17-20] 20 (04/02 0554) BP: (127-153)/(74-86) 153/86 (04/02 0554) SpO2:  [97 %-98 %] 97 % (04/02 0554)   Intake/Output: 04/01 0701 - 04/02 0700 In: 800 [I.V.:800] Out: 2105 [Urine:2105] Last BM Date: 06/28/17  Physical Exam: Constitutional: No acute distress Abdomen:  Soft, nondistended, with some soreness in epigastric area.  No pain in right upper quadrant, negative Murphy's sign.  Labs:  Recent Labs    06/02/17 0017 06/03/17 0528  WBC 25.1* 9.8  HGB 15.8 12.7*  HCT 47.2 38.2*  PLT 491* 340   Recent Labs    06/02/17 0017 06/03/17 0528  NA 135 136  K 4.1 4.2  CL 100* 105  CO2 25 24  GLUCOSE 169* 134*  BUN 14 10  CREATININE 1.25* 1.26*  CALCIUM 9.2 8.6*   Recent Labs    06/02/17 0402  LABPROT 15.2  INR 1.21    Imaging: Mr 3d Recon At Scanner  Result Date: 06/02/2017 CLINICAL DATA:  Acute onset epigastric pain with elevated LFTs and lipase level. Possible gallstones on ultrasound. EXAM: MRI ABDOMEN WITHOUT AND WITH CONTRAST (INCLUDING MRCP) TECHNIQUE: Multiplanar multisequence MR imaging of the abdomen was performed both before and after the administration of intravenous contrast. Heavily T2-weighted images of the biliary and pancreatic ducts were obtained, and three-dimensional MRCP images were rendered by post processing. CONTRAST:  25mL MULTIHANCE GADOBENATE DIMEGLUMINE 529 MG/ML IV SOLN COMPARISON:  Ultrasound exam from earlier the same day. FINDINGS: Lower chest: Trace dependent atelectasis.  Otherwise unremarkable. Hepatobiliary: Liver measures 20.8 cm craniocaudal length. No focal abnormality within the liver parenchyma. Gallbladder shows diffuse wall  thickening as noted on recent ultrasound exam. There is some sludge in the neck of the gallbladder without a definite gallstone evident. There is no intra or extrahepatic biliary duct dilatation. No evidence for choledocholithiasis. Pancreas: No focal mass lesion. No dilatation of the main duct. No intraparenchymal cyst. No peripancreatic edema. Spleen:  No splenomegaly. No focal mass lesion. Adrenals/Urinary Tract: No adrenal nodule or mass. Right kidney unremarkable. 6 mm simple cyst noted in the interpolar left kidney with another 2.1 cm cyst in the lateral aspect of the left kidney. Stomach/Bowel: 5.3 x 5.1 x 6.6 cm well-defined homogeneous mass is identified in the upper abdomen, anterior to the esophagogastric junction and displacing the lateral segment of the left liver anteriorly and the mid stomach laterally. Postcontrast T1 weighted imaging shows preservation of the fat plane between the lesion and the left liver. Coronal post-contrast imaging suggests that the lesion arises from the distal esophagus or proximal stomach in the region of the esophagogastric junction (see image 47 of coronal postcontrast series 25). The lesion has intermediate signal intensity on both T1 and T2 weighted imaging. No discernible enhancement within the lesion after IV contrast administration. Stomach is otherwise unremarkable. Duodenum is normally positioned as is the ligament of Treitz. No small bowel or colonic dilatation within the visualized abdomen. Vascular/Lymphatic: No abdominal aortic aneurysm. Portal vein, superior mesenteric vein, and splenic vein are patent. Celiac axis and SMA enhance after IV contrast administration. No abdominal lymphadenopathy Other:  No intraperitoneal free fluid. Musculoskeletal: Susceptibility artifact from lumbar fusion hardware evident. No abnormal marrow enhancement within the visualized bony  anatomy. IMPRESSION: 1. 6.6 cm well defined, homogeneous, nonenhancing mass is identified anterior  to the esophagogastric junction, generating mass-effect on the left liver and proximal/mid stomach. Lesion does not appear to be hepatic in origin, but coronal imaging suggests origin from distal esophagus/esophagogastric junction. 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 Esophageal or gastric duplication cyst would be a consideration. Given the marked homogeneity of the lesion and lack of discernible enhancement, GI stromal tumor considered less likely. Upper endoscopy with endoscopic ultrasound should be considered. 2. Diffuse gallbladder wall thickening with dependent sludge in the gallbladder lumen. No definite gallstones identified. There is no intra or extrahepatic biliary duct dilatation and no MR findings of choledocholithiasis. 3. Normal MR appearance of the pancreas with no ductal dilatation, mass lesion, or peripancreatic edema/inflammation. Pancreatic parenchyma enhances throughout. Electronically Signed   By: Misty Stanley M.D.   On: 06/02/2017 11:32   Nm Hepato W/eject Fract  Result Date: 06/02/2017 CLINICAL DATA:  Epigastric pain. Gallbladder wall thickening on MRI. Debris versus sludge versus stones within the lumen gallbladder. EXAM: NUCLEAR MEDICINE HEPATOBILIARY IMAGING TECHNIQUE: Sequential images of the abdomen were obtained out to 60 minutes following intravenous administration of radiopharmaceutical. RADIOPHARMACEUTICALS:  5.4 mCi Tc-20m  Choletec IV COMPARISON:  Ultrasound 06/02/2017, MRI 06/02/2017 FINDINGS: Prior radiotracer accumulation within the liver. No counts evident at 1 hour period. At 2 hour interval, no counts within the bowel. The gallbladder does not fill at 2 hours. On the MRI imaging of same day there is no evidence of common bile duct obstruction. No choledocholithiasis or intrahepatic duct dilatation. IMPRESSION: Not excretion of radiotracer into the common hepatic duct or  common bile duct is most consistent with hepatic dysfunction versus extrahepatic ductal obstruction. As there is no evidence obstruction on same day MRI would favor hepatic dysfunction such as hepatitis or cholestasis. Early ductal obstruction could have the similar appearance. Gallbladder cannot be evaluated with out biliary excretion. Cannot exclude acute cholecystitis. These results will be called to the ordering clinician or representative by the Radiologist Assistant, and communication documented in the PACS or zVision Dashboard. Electronically Signed   By: Suzy Bouchard M.D.   On: 06/02/2017 18:30   Mr Abdomen Mrcp W Wo Contast  Result Date: 06/02/2017 CLINICAL DATA:  Acute onset epigastric pain with elevated LFTs and lipase level. Possible gallstones on ultrasound. EXAM: MRI ABDOMEN WITHOUT AND WITH CONTRAST (INCLUDING MRCP) TECHNIQUE: Multiplanar multisequence MR imaging of the abdomen was performed both before and after the administration of intravenous contrast. Heavily T2-weighted images of the biliary and pancreatic ducts were obtained, and three-dimensional MRCP images were rendered by post processing. CONTRAST:  33mL MULTIHANCE GADOBENATE DIMEGLUMINE 529 MG/ML IV SOLN COMPARISON:  Ultrasound exam from earlier the same day. FINDINGS: Lower chest: Trace dependent atelectasis.  Otherwise unremarkable. Hepatobiliary: Liver measures 20.8 cm craniocaudal length. No focal abnormality within the liver parenchyma. Gallbladder shows diffuse wall thickening as noted on recent ultrasound exam. There is some sludge in the neck of the gallbladder without a definite gallstone evident. There is no intra or extrahepatic biliary duct dilatation. No evidence for choledocholithiasis. Pancreas: No focal mass lesion. No dilatation of the main duct. No intraparenchymal cyst. No peripancreatic edema. Spleen:  No splenomegaly. No focal mass lesion. Adrenals/Urinary Tract: No adrenal nodule or mass. Right kidney  unremarkable. 6 mm simple cyst noted in the interpolar left kidney with another 2.1 cm cyst in the lateral aspect of the  left kidney. Stomach/Bowel: 5.3 x 5.1 x 6.6 cm well-defined homogeneous mass is identified in the upper abdomen, anterior to the esophagogastric junction and displacing the lateral segment of the left liver anteriorly and the mid stomach laterally. Postcontrast T1 weighted imaging shows preservation of the fat plane between the lesion and the left liver. Coronal post-contrast imaging suggests that the lesion arises from the distal esophagus or proximal stomach in the region of the esophagogastric junction (see image 47 of coronal postcontrast series 25). The lesion has intermediate signal intensity on both T1 and T2 weighted imaging. No discernible enhancement within the lesion after IV contrast administration. Stomach is otherwise unremarkable. Duodenum is normally positioned as is the ligament of Treitz. No small bowel or colonic dilatation within the visualized abdomen. Vascular/Lymphatic: No abdominal aortic aneurysm. Portal vein, superior mesenteric vein, and splenic vein are patent. Celiac axis and SMA enhance after IV contrast administration. No abdominal lymphadenopathy Other:  No intraperitoneal free fluid. Musculoskeletal: Susceptibility artifact from lumbar fusion hardware evident. No abnormal marrow enhancement within the visualized bony anatomy. IMPRESSION: 1. 6.6 cm well defined, homogeneous, nonenhancing mass is identified anterior to the esophagogastric junction, generating mass-effect on the left liver and proximal/mid stomach. Lesion does not appear to be hepatic in origin, but coronal imaging suggests origin from distal esophagus/esophagogastric junction. 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 Esophageal or gastric duplication cyst would be a consideration. Given the marked  homogeneity of the lesion and lack of discernible enhancement, GI stromal tumor considered less likely. Upper endoscopy with endoscopic ultrasound should be considered. 2. Diffuse gallbladder wall thickening with dependent sludge in the gallbladder lumen. No definite gallstones identified. There is no intra or extrahepatic biliary duct dilatation and no MR findings of choledocholithiasis. 3. Normal MR appearance of the pancreas with no ductal dilatation, mass lesion, or peripancreatic edema/inflammation. Pancreatic parenchyma enhances throughout. Electronically Signed   By: Misty Stanley M.D.   On: 06/02/2017 11:32    Assessment/Plan: 72 yo male with epigastric abdominal pain.  --Discussed with patient that MRCP did not show choledocholithiasis and showed this 6.6 cm mass that could be possibly a pancreatic cyst or possibly originate from distal esophagus or GE junction.  This will need further work up.  Discussed the HIDA scan and that it's confusing given that there was no obstruction on MRCP but the HIDA scan tracer does not make it into the common hepatic duct.  Gallbladder was unable to be evaluated due to this.  His total bilirubin is more elevated today.  Hepatitis panel was negative yesterday. --Would defer at this point to GI.  He has no pain in right upper quadrant and his WBC normalized today from 25.1 to 9.8 with only one dose of ceftriaxone.  Discussed with patient that he may still need a cholecystectomy but he needs further workup of his elevated total bilirubin and HIDA results.   Rodney Hall, [place]

## 2017-06-03 NOTE — Progress Notes (Signed)
[facility] [place] NAME: Rodney Hall    MR#:  [id number]  DATE OF BIRTH:  1946-01-04  SUBJECTIVE:  CHIEF COMPLAINT:   Chief Complaint  Patient presents with  . Abdominal Pain   No abdominal pain but nausea REVIEW OF SYSTEMS:  Review of Systems  Constitutional: Negative for chills, fever and malaise/fatigue.  HENT: Negative for sore throat.   Eyes: Negative for blurred vision and double vision.  Respiratory: Negative for cough, hemoptysis, shortness of breath, wheezing and stridor.   Cardiovascular: Negative for chest pain, palpitations, orthopnea and leg swelling.  Gastrointestinal: Positive for nausea. Negative for abdominal pain, blood in stool, diarrhea, melena and vomiting.  Genitourinary: Negative for dysuria, flank pain and hematuria.  Musculoskeletal: Negative for back pain and joint pain.  Neurological: Negative for dizziness, sensory change, focal weakness, seizures, loss of consciousness, weakness and headaches.  Endo/Heme/Allergies: Negative for polydipsia.  Psychiatric/Behavioral: Negative for depression. The patient is not nervous/anxious.     DRUG ALLERGIES:   Allergies  Allergen Reactions  . Amoxicillin Rash    Has patient had a PCN reaction causing immediate rash, facial/tongue/throat swelling, SOB or lightheadedness with hypotension: Yes Has patient had a PCN reaction causing severe rash involving mucus membranes or skin necrosis: No Has patient had a PCN reaction that required hospitalization No Has patient had a PCN reaction occurring within the last 10 years: No If all of the above answers are "NO", then may proceed with Cephalosporin use.     VITALS:  Blood pressure (!) 149/88, pulse 71, temperature 98.1 F (36.7 C), temperature source Oral, resp. rate 20, height 5\' 11"  (1.803 m), weight 192 lb 3.9 oz (87.2 kg), SpO2 100 %. PHYSICAL EXAMINATION:  Physical Exam  Constitutional: He is oriented to  person, place, and time and well-developed, well-nourished, and in no distress.  HENT:  Head: Normocephalic.  Mouth/Throat: Oropharynx is clear and moist.  Eyes: Pupils are equal, round, and reactive to light. Conjunctivae and EOM are normal. No scleral icterus.  Neck: Normal range of motion. Neck supple. No JVD present. No tracheal deviation present.  Cardiovascular: Normal rate, regular rhythm and normal heart sounds. Exam reveals no gallop.  No murmur heard. Pulmonary/Chest: Effort normal and breath sounds normal. No respiratory distress. He has no wheezes. He has no rales.  Abdominal: Soft. Bowel sounds are normal. He exhibits no distension. There is tenderness. There is no rebound.  Mild tenderness on RUQ  Musculoskeletal: Normal range of motion. He exhibits no edema or tenderness.  Neurological: He is alert and oriented to person, place, and time. No cranial nerve deficit.  Skin: No rash noted. No erythema.  Psychiatric: Affect normal.   LABORATORY PANEL:  Male CBC Recent Labs  Lab 06/03/17 0528  WBC 9.8  HGB 12.7*  HCT 38.2*  PLT 340   ------------------------------------------------------------------------------------------------------------------ Chemistries  Recent Labs  Lab 06/03/17 0528  NA 136  K 4.2  CL 105  CO2 24  GLUCOSE 134*  BUN 10  CREATININE 1.26*  CALCIUM 8.6*  AST 183*  ALT 245*  ALKPHOS 298*  BILITOT 4.0*   RADIOLOGY:  Nm Hepato W/eject Fract  Result Date: 06/02/2017 CLINICAL DATA:  Epigastric pain. Gallbladder wall thickening on MRI. Debris versus sludge versus stones within the lumen gallbladder. EXAM: NUCLEAR MEDICINE HEPATOBILIARY IMAGING TECHNIQUE: Sequential images of the abdomen were obtained out to 60 minutes following intravenous administration of radiopharmaceutical. RADIOPHARMACEUTICALS:  5.4 mCi Tc-76m  Choletec IV COMPARISON:  Ultrasound  06/02/2017, MRI 06/02/2017 FINDINGS: Prior radiotracer accumulation within the liver. No counts  evident at 1 hour period. At 2 hour interval, no counts within the bowel. The gallbladder does not fill at 2 hours. On the MRI imaging of same day there is no evidence of common bile duct obstruction. No choledocholithiasis or intrahepatic duct dilatation. IMPRESSION: Not excretion of radiotracer into the common hepatic duct or common bile duct is most consistent with hepatic dysfunction versus extrahepatic ductal obstruction. As there is no evidence obstruction on same day MRI would favor hepatic dysfunction such as hepatitis or cholestasis. Early ductal obstruction could have the similar appearance. Gallbladder cannot be evaluated with out biliary excretion. Cannot exclude acute cholecystitis. These results will be called to the ordering clinician or representative by the Radiologist Assistant, and communication documented in the PACS or zVision Dashboard. Electronically Signed   By: Suzy Bouchard M.D.   On: 06/02/2017 18:30   ASSESSMENT AND PLAN:   1 6.6 cm mass that could be possibly a pancreatic cyst or possibly originate from distal esophagus or GE junction. Need further workup and follow-up with GI per Dr. Dahlia Byes.  2.  Worsening bilirubin and diffuse gallbladder wall thickening with dependent sludge in the gallbladder lumen. Follow-up liver function test and GI recommendation. Per Dr. Dahlia Byes, he may still need a cholecystectomy but he needs further workup of his elevated total bilirubin and HIDA results.  2 No Interstitial cystitis UA is normal, the patient is asymptomatic.  3 chronic BPH Stable Continue flomax  4 acute leukocytosis Most likely due to above Improved with Rocephin IV.  All the records are reviewed and case discussed with Care Management/Social Worker. Management plans discussed with the patient, family and they are in agreement.  CODE STATUS: Full Code  TOTAL TIME TAKING CARE OF THIS PATIENT: 35 minutes.   More than 50% of the time was spent in  counseling/coordination of care: YES  POSSIBLE D/C IN 2 DAYS, DEPENDING ON CLINICAL CONDITION.   Demetrios Loll M.D on 06/03/2017 at 3:13 PM  Between 7am to 6pm - Pager - [phone]  After 6pm go to [email or web address] - [facility]

## 2017-06-04 LAB — PROTIME-INR
INR: 1.12
Prothrombin Time: 14.3 s (ref 11.4–15.2)

## 2017-06-04 LAB — COMPREHENSIVE METABOLIC PANEL WITH GFR
ALT: 175 U/L — ABNORMAL HIGH (ref 17–63)
AST: 83 U/L — ABNORMAL HIGH (ref 15–41)
Albumin: 3.1 g/dL — ABNORMAL LOW (ref 3.5–5.0)
Alkaline Phosphatase: 287 U/L — ABNORMAL HIGH (ref 38–126)
Anion gap: 7 (ref 5–15)
BUN: 14 mg/dL (ref 6–20)
CO2: 24 mmol/L (ref 22–32)
Calcium: 8.8 mg/dL — ABNORMAL LOW (ref 8.9–10.3)
Chloride: 107 mmol/L (ref 101–111)
Creatinine, Ser: 1.19 mg/dL (ref 0.61–1.24)
GFR calc Af Amer: >60 mL/min
GFR calc non Af Amer: 60 mL/min — ABNORMAL LOW
Glucose, Bld: 115 mg/dL — ABNORMAL HIGH (ref 65–99)
Potassium: 4 mmol/L (ref 3.5–5.1)
Sodium: 138 mmol/L (ref 135–145)
Total Bilirubin: 1.2 mg/dL (ref 0.3–1.2)
Total Protein: 7.3 g/dL (ref 6.5–8.1)

## 2017-06-04 LAB — FERRITIN: Ferritin: 225 ng/mL (ref 24–336)

## 2017-06-04 LAB — CBC WITH DIFFERENTIAL/PLATELET
Basophils Absolute: 0 10*3/uL (ref 0–0.1)
Basophils Relative: 1 %
Eosinophils Absolute: 0.2 10*3/uL (ref 0–0.7)
Eosinophils Relative: 2 %
HCT: 37.9 % — ABNORMAL LOW (ref 40.0–52.0)
Hemoglobin: 12.8 g/dL — ABNORMAL LOW (ref 13.0–18.0)
Lymphocytes Relative: 16 %
Lymphs Abs: 1.4 10*3/uL (ref 1.0–3.6)
MCH: 30.5 pg (ref 26.0–34.0)
MCHC: 33.8 g/dL (ref 32.0–36.0)
MCV: 90.1 fL (ref 80.0–100.0)
Monocytes Absolute: 0.6 10*3/uL (ref 0.2–1.0)
Monocytes Relative: 7 %
Neutro Abs: 6.3 10*3/uL (ref 1.4–6.5)
Neutrophils Relative %: 74 %
Platelets: 355 10*3/uL (ref 150–440)
RBC: 4.21 MIL/uL — ABNORMAL LOW (ref 4.40–5.90)
RDW: 13.3 % (ref 11.5–14.5)
WBC: 8.5 10*3/uL (ref 3.8–10.6)

## 2017-06-04 LAB — IRON AND TIBC
Iron: 51 ug/dL (ref 45–182)
Saturation Ratios: 18 % (ref 17.9–39.5)
TIBC: 289 ug/dL (ref 250–450)
UIBC: 238 ug/dL

## 2017-06-04 LAB — BILIRUBIN, DIRECT: Bilirubin, Direct: 0.5 mg/dL (ref 0.1–0.5)

## 2017-06-04 MED ORDER — HYDROCODONE-ACETAMINOPHEN 5-325 MG PO TABS: 1.0000 | ORAL_TABLET | Freq: Four times a day (QID) | ORAL | 0 refills | Status: DC | PRN

## 2017-06-04 MED ORDER — CIPROFLOXACIN HCL 500 MG PO TABS: 500.0000 mg | ORAL_TABLET | Freq: Two times a day (BID) | ORAL | 0 refills | Status: AC

## 2017-06-04 NOTE — Progress Notes (Addendum)
06/04/2017  Subjective: No acute events. Patient started on a diet yesterday without any further pain or nausea.  He's doing well this morning and is wondering about going home.  Vital signs: Temp:  [97.9 F (36.6 C)-98.1 F (36.7 C)] 97.9 F (36.6 C) (04/03 0409) Pulse Rate:  [66-79] 66 (04/03 0409) Resp:  [18-20] 18 (04/03 0409) BP: (140-149)/(82-88) 147/83 (04/03 0409) SpO2:  [96 %-100 %] 97 % (04/03 0409)   Intake/Output: 04/02 0701 - 04/03 0700 In: 2642 [I.V.:2642] Out: 1025 [Urine:1025] Last BM Date: 06/03/17  Physical Exam: Constitutional: No acute distress Abdomen:  Soft, nondistended, nontender to palpation  Labs:  Recent Labs    06/03/17 0528 06/04/17 0325  WBC 9.8 8.5  HGB 12.7* 12.8*  HCT 38.2* 37.9*  PLT 340 355   Recent Labs    06/03/17 0528 06/04/17 0325  NA 136 138  K 4.2 4.0  CL 105 107  CO2 24 24  GLUCOSE 134* 115*  BUN 10 14  CREATININE 1.26* 1.19  CALCIUM 8.6* 8.8*   Recent Labs    06/02/17 0402 06/04/17 0325  LABPROT 15.2 14.3  INR 1.21 1.12    Imaging: No results found.  Assessment/Plan: 72 yo male with suspected cholecystitis vs gallstone pancreatitis vs hepatic dysfunction.  --patient's etiology still unclear given that he had an elevation of his LFTs without any choledocholithiasis on MRCP, but still with a non-diagnostic HIDA scan.  The mass noted on MRCP still needs workup, but at this point I am not convinced that the gallbladder is the etiology of this admission.  Discussed with patient that after his workup is done for this mass found on MRCP, I would be happy to see him in the office as an outpatient to discuss results and talk further about possible cholecystectomy. --may discharge to home from surgical standpoint.  Given that he was started on ceftriaxone in hospital, could do ciprofloxacin outpatient course for total 10 days of coverage. --will sign off for now.  Please feel free to consult or call again if any questions  or concerns.   Melvyn Neth, [place] 7a-7p: [place] 7p-7a: [phone]

## 2017-06-04 NOTE — Care Management Important Message (Signed)
Important Message  Patient Details  Name: Rodney Hall. MRN: [id number] Date of Birth: June 30, 1945   Medicare Important Message Given:  N/A - LOS <3 / Initial given by admissions    Beverly Sessions, RN 06/04/2017, 1:44 PM

## 2017-06-04 NOTE — Discharge Instructions (Signed)
Follow-up with primary care physician in 1 week Follow-up with surgery Dr. Perrin Maltese in 10 days

## 2017-06-04 NOTE — Discharge Summary (Signed)
[facility] [place] NAME: Rodney Hall    MR#:  [id number]  DATE OF BIRTH:  07/13/1945  DATE OF ADMISSION:  06/02/2017 ADMITTING PHYSICIAN: Gorden Harms, MD  DATE OF DISCHARGE: 06/04/17  PRIMARY CARE PHYSICIAN: Carmon Ginsberg, PA    ADMISSION DIAGNOSIS:  Choledocholithiasis with acute cholecystitis [K80.42] Upper abdominal pain [R10.10]  DISCHARGE DIAGNOSIS:  Active Problems:   Cholecystitis   Choledocholithiasis with acute cholecystitis   Upper abdominal pain   Elevated LFTs   Pancreatic cyst   SECONDARY DIAGNOSIS:   Past Medical History:  Diagnosis Date  . Abdominal pain 06/02/2017  . Anxiety   . Arthritis   . History of actinic keratoses   . Hyperglycemia     HOSPITAL COURSE:   HISTORY OF PRESENT ILLNESS: Rodney Hall  is a 72 y.o. 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, interstitial cystitis-on doxycycline, presenting with intermittent worsening epigastric pain for 2 weeks radiating into his back/shoulder blades, worse with eating, had ultrasound done a week ago which was noticed for pancreatic cyst, patient developed acute pain once again that was 10 out of 10 that started yesterday evening after eating cake, made better with pain medicine received in the emergency room, workup in the emergency room noted for white count of 25,000, creatinine 1.2, lipase 296, AST 290, total bili 2.2, abdominal ultrasound noted for gallbladder wall thickening concerning for acute cholecystitis/common bile duct dilatation of 7 mm, patient seen in the emergency room with wife at bedside, patient in no apparent distress, denies pain, during this 2-week.  Patient has had intermittent fevers subjectively, chills, night sweats, nausea, per ED attending-General surgery recommended gastroenterology consultation for ERCP possibly, patient is now been admitted for  acute probable acalculous cholecystitis with transaminitis.  16.6 cm mass that could be possibly a pancreatic cyst or possibly originate from distal esophagus or GE junction. Need further workup and follow-up with Dr. Dahlia Byes in  10 days after discharge as an outpatient  2.  Worsening bilirubin and diffuse gallbladder wall thickening with dependent sludge in the gallbladder lumen. LFTs including bilirubin are getting better.  Patient is tolerating diet.  GI signed off Outpatient follow-up with surgery with ciprofloxacin p.o. for 10 days Per Dr. Dahlia Byes, he may still need a cholecystectomy as an outpatient follow-up with surgery in 10 days Holding his home medication statin in view of elevated LFTs.  If they are back to normal PCP can consider resuming  2 Interstitial cystitis ?? UA is normal, the patient is asymptomatic.  3 chronic BPH Stable Continue flomax  4 acute leukocytosis Most likely due to above Improved with Rocephin IV.  Surgery has recommended to discharge patient with p.o. ciprofloxacin for 10 days and outpatient follow-up with surgery in 10 days    DISCHARGE CONDITIONS:   stable  CONSULTS OBTAINED:     PROCEDURES  None   DRUG ALLERGIES:   Allergies  Allergen Reactions  . Amoxicillin Rash    Has patient had a PCN reaction causing immediate rash, facial/tongue/throat swelling, SOB or lightheadedness with hypotension: Yes Has patient had a PCN reaction causing severe rash involving mucus membranes or skin necrosis: No Has patient had a PCN reaction that required hospitalization No Has patient had a PCN reaction occurring within the last 10 years: No If all of the above answers are "NO", then may proceed with Cephalosporin use.  DISCHARGE MEDICATIONS:   Allergies as of 06/04/2017      Reactions   Amoxicillin Rash   Has patient had a PCN reaction causing immediate rash, facial/tongue/throat swelling, SOB or lightheadedness with hypotension: Yes Has  patient had a PCN reaction causing severe rash involving mucus membranes or skin necrosis: No Has patient had a PCN reaction that required hospitalization No Has patient had a PCN reaction occurring within the last 10 years: No If all of the above answers are "NO", then may proceed with Cephalosporin use.      Medication List    STOP taking these medications   naproxen sodium 220 MG tablet Commonly known as:  ALEVE   pravastatin 20 MG tablet Commonly known as:  PRAVACHOL     TAKE these medications   ciprofloxacin 500 MG tablet Commonly known as:  CIPRO Take 1 tablet (500 mg total) by mouth 2 (two) times daily for 10 days.   ELMIRON 100 MG capsule Generic drug:  pentosan polysulfate Take 100 mg by mouth 3 (three) times daily.   FLOMAX 0.4 MG Caps capsule Generic drug:  tamsulosin Take 0.4 mg by mouth daily after supper.   HYDROcodone-acetaminophen 5-325 MG tablet Commonly known as:  NORCO/VICODIN Take 1-2 tablets by mouth every 6 (six) hours as needed for moderate pain.   imipramine 50 MG tablet Commonly known as:  TOFRANIL Take 50 mg by mouth at bedtime.        DISCHARGE INSTRUCTIONS:   Follow-up with primary care physician in 1 week Follow-up with surgery Dr. Perrin Maltese in 2-3 weeks  Follow-up with gastroenterology Dr. Vicente Males as scheduled on May 1  DIET:  Low fat, Low cholesterol diet  DISCHARGE CONDITION:  Fair  ACTIVITY:  Activity as tolerated  OXYGEN:  Home Oxygen: No.   Oxygen Delivery: room air  DISCHARGE LOCATION:  home   If you experience worsening of your admission symptoms, develop shortness of breath, life threatening emergency, suicidal or homicidal thoughts you must seek medical attention immediately by calling 911 or calling your MD immediately  if symptoms less severe.  You Must read complete instructions/literature along with all the possible adverse reactions/side effects for all the Medicines you take and that have been prescribed to you.  Take any new Medicines after you have completely understood and accpet all the possible adverse reactions/side effects.   Please note  You were cared for by a hospitalist during your hospital stay. If you have any questions about your discharge medications or the care you received while you were in the hospital after you are discharged, you can call the unit and asked to speak with the hospitalist on call if the hospitalist that took care of you is not available. Once you are discharged, your primary care physician will handle any further medical issues. Please note that NO REFILLS for any discharge medications will be authorized once you are discharged, as it is imperative that you return to your primary care physician (or establish a relationship with a primary care physician if you do not have one) for your aftercare needs so that they can reassess your need for medications and monitor your lab values.     Today  Chief Complaint  Patient presents with  . Abdominal Pain   Patient is feeling fine.  Tolerating diet.  Denies any nausea vomiting.  Okay to discharge patient from GI and surgery standpoint  ROS:  CONSTITUTIONAL: Denies fevers, chills. Denies any fatigue, weakness.  EYES: Denies blurry vision, double vision,  eye pain. EARS, NOSE, THROAT: Denies tinnitus, ear pain, hearing loss. RESPIRATORY: Denies cough, wheeze, shortness of breath.  CARDIOVASCULAR: Denies chest pain, palpitations, edema.  GASTROINTESTINAL: Denies nausea, vomiting, diarrhea, abdominal pain. Denies bright red blood per rectum. GENITOURINARY: Denies dysuria, hematuria. ENDOCRINE: Denies nocturia or thyroid problems. HEMATOLOGIC AND LYMPHATIC: Denies easy bruising or bleeding. SKIN: Denies rash or lesion. MUSCULOSKELETAL: Denies pain in neck, back, shoulder, knees, hips or arthritic symptoms.  NEUROLOGIC: Denies paralysis, paresthesias.  PSYCHIATRIC: Denies anxiety or depressive symptoms.   VITAL SIGNS:   Blood pressure (!) 147/83, pulse 66, temperature 97.9 F (36.6 C), temperature source Oral, resp. rate 18, height 5\' 11"  (1.803 m), weight 87.2 kg (192 lb 3.9 oz), SpO2 97 %.  I/O:    Intake/Output Summary (Last 24 hours) at 06/04/2017 1313 Last data filed at 06/04/2017 1038 Gross per 24 hour  Intake 2946 ml  Output 1025 ml  Net 1921 ml    PHYSICAL EXAMINATION:  GENERAL:  72 y.o.-year-old patient lying in the bed with no acute distress.  EYES: Pupils equal, round, reactive to light and accommodation. No scleral icterus. Extraocular muscles intact.  HEENT: Head atraumatic, normocephalic. Oropharynx and nasopharynx clear.  NECK:  Supple, no jugular venous distention. No thyroid enlargement, no tenderness.  LUNGS: Normal breath sounds bilaterally, no wheezing, rales,rhonchi or crepitation. No use of accessory muscles of respiration.  CARDIOVASCULAR: S1, S2 normal. No murmurs, rubs, or gallops.  ABDOMEN: Soft, non-tender, non-distended. Bowel sounds present. No organomegaly or mass.  EXTREMITIES: No pedal edema, cyanosis, or clubbing.  NEUROLOGIC: Cranial nerves II through XII are intact. Muscle strength 5/5 in all extremities. Sensation intact. Gait not checked.  PSYCHIATRIC: The patient is alert and oriented x 3.  SKIN: No obvious rash, lesion, or ulcer.   DATA REVIEW:   CBC Recent Labs  Lab 06/04/17 0325  WBC 8.5  HGB 12.8*  HCT 37.9*  PLT 355    Chemistries  Recent Labs  Lab 06/04/17 0325  NA 138  K 4.0  CL 107  CO2 24  GLUCOSE 115*  BUN 14  CREATININE 1.19  CALCIUM 8.8*  AST 83*  ALT 175*  ALKPHOS 287*  BILITOT 1.2    Cardiac Enzymes No results for input(s): TROPONINI in the last 168 hours.  Microbiology Results  Results for orders placed or performed during the hospital encounter of 06/02/17  CULTURE, BLOOD (ROUTINE X 2) w Reflex to ID Panel     Status: None (Preliminary result)   Collection Time: 06/02/17  5:19 AM  Result Value Ref Range Status    Specimen Description BLOOD LEFT ANTECUBITAL  Final   Special Requests   Final    BOTTLES DRAWN AEROBIC AND ANAEROBIC Blood Culture adequate volume   Culture   Final    NO GROWTH 2 DAYS Performed at [facility], [address], [place], [place] [address]    Report Status PENDING  Incomplete  CULTURE, BLOOD (ROUTINE X 2) w Reflex to ID Panel     Status: None (Preliminary result)   Collection Time: 06/02/17  5:24 AM  Result Value Ref Range Status   Specimen Description BLOOD LEFT HAND  Final   Special Requests   Final    BOTTLES DRAWN AEROBIC AND ANAEROBIC Blood Culture adequate volume   Culture   Final    NO GROWTH 2 DAYS Performed at [facility], [address], [place], [place] [address]    Report Status PENDING  Incomplete    RADIOLOGY:  Mr  3d Recon At Scanner  Result Date: 06/02/2017 CLINICAL DATA:  Acute onset epigastric pain with elevated LFTs and lipase level. Possible gallstones on ultrasound. EXAM: MRI ABDOMEN WITHOUT AND WITH CONTRAST (INCLUDING MRCP) TECHNIQUE: Multiplanar multisequence MR imaging of the abdomen was performed both before and after the administration of intravenous contrast. Heavily T2-weighted images of the biliary and pancreatic ducts were obtained, and three-dimensional MRCP images were rendered by post processing. CONTRAST:  55mL MULTIHANCE GADOBENATE DIMEGLUMINE 529 MG/ML IV SOLN COMPARISON:  Ultrasound exam from earlier the same day. FINDINGS: Lower chest: Trace dependent atelectasis.  Otherwise unremarkable. Hepatobiliary: Liver measures 20.8 cm craniocaudal length. No focal abnormality within the liver parenchyma. Gallbladder shows diffuse wall thickening as noted on recent ultrasound exam. There is some sludge in the neck of the gallbladder without a definite gallstone evident. There is no intra or extrahepatic biliary duct dilatation. No evidence for choledocholithiasis. Pancreas: No focal mass lesion. No dilatation of the main duct. No  intraparenchymal cyst. No peripancreatic edema. Spleen:  No splenomegaly. No focal mass lesion. Adrenals/Urinary Tract: No adrenal nodule or mass. Right kidney unremarkable. 6 mm simple cyst noted in the interpolar left kidney with another 2.1 cm cyst in the lateral aspect of the left kidney. Stomach/Bowel: 5.3 x 5.1 x 6.6 cm well-defined homogeneous mass is identified in the upper abdomen, anterior to the esophagogastric junction and displacing the lateral segment of the left liver anteriorly and the mid stomach laterally. Postcontrast T1 weighted imaging shows preservation of the fat plane between the lesion and the left liver. Coronal post-contrast imaging suggests that the lesion arises from the distal esophagus or proximal stomach in the region of the esophagogastric junction (see image 47 of coronal postcontrast series 25). The lesion has intermediate signal intensity on both T1 and T2 weighted imaging. No discernible enhancement within the lesion after IV contrast administration. Stomach is otherwise unremarkable. Duodenum is normally positioned as is the ligament of Treitz. No small bowel or colonic dilatation within the visualized abdomen. Vascular/Lymphatic: No abdominal aortic aneurysm. Portal vein, superior mesenteric vein, and splenic vein are patent. Celiac axis and SMA enhance after IV contrast administration. No abdominal lymphadenopathy Other:  No intraperitoneal free fluid. Musculoskeletal: Susceptibility artifact from lumbar fusion hardware evident. No abnormal marrow enhancement within the visualized bony anatomy. IMPRESSION: 1. 6.6 cm well defined, homogeneous, nonenhancing mass is identified anterior to the esophagogastric junction, generating mass-effect on the left liver and proximal/mid stomach. Lesion does not appear to be hepatic in origin, but coronal imaging suggests origin from distal esophagus/esophagogastric junction. 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 Esophageal or gastric duplication cyst would be a consideration. Given the marked homogeneity of the lesion and lack of discernible enhancement, GI stromal tumor considered less likely. Upper endoscopy with endoscopic ultrasound should be considered. 2. Diffuse gallbladder wall thickening with dependent sludge in the gallbladder lumen. No definite gallstones identified. There is no intra or extrahepatic biliary duct dilatation and no MR findings of choledocholithiasis. 3. Normal MR appearance of the pancreas with no ductal dilatation, mass lesion, or peripancreatic edema/inflammation. Pancreatic parenchyma enhances throughout. Electronically Signed   By: Misty Stanley M.D.   On: 06/02/2017 11:32   Nm Hepato W/eject Fract  Result Date: 06/02/2017 CLINICAL DATA:  Epigastric pain. Gallbladder wall thickening on MRI. Debris versus sludge versus stones within the lumen gallbladder. EXAM: NUCLEAR MEDICINE HEPATOBILIARY IMAGING TECHNIQUE: Sequential images of the abdomen were obtained out  to 60 minutes following intravenous administration of radiopharmaceutical. RADIOPHARMACEUTICALS:  5.4 mCi Tc-44m  Choletec IV COMPARISON:  Ultrasound 06/02/2017, MRI 06/02/2017 FINDINGS: Prior radiotracer accumulation within the liver. No counts evident at 1 hour period. At 2 hour interval, no counts within the bowel. The gallbladder does not fill at 2 hours. On the MRI imaging of same day there is no evidence of common bile duct obstruction. No choledocholithiasis or intrahepatic duct dilatation. IMPRESSION: Not excretion of radiotracer into the common hepatic duct or common bile duct is most consistent with hepatic dysfunction versus extrahepatic ductal obstruction. As there is no evidence obstruction on same day MRI would favor hepatic dysfunction such as hepatitis or cholestasis. Early ductal obstruction could have the similar appearance. Gallbladder cannot  be evaluated with out biliary excretion. Cannot exclude acute cholecystitis. These results will be called to the ordering clinician or representative by the Radiologist Assistant, and communication documented in the PACS or zVision Dashboard. Electronically Signed   By: Suzy Bouchard M.D.   On: 06/02/2017 18:30   Mr Abdomen Mrcp W Wo Contast  Result Date: 06/02/2017 CLINICAL DATA:  Acute onset epigastric pain with elevated LFTs and lipase level. Possible gallstones on ultrasound. EXAM: MRI ABDOMEN WITHOUT AND WITH CONTRAST (INCLUDING MRCP) TECHNIQUE: Multiplanar multisequence MR imaging of the abdomen was performed both before and after the administration of intravenous contrast. Heavily T2-weighted images of the biliary and pancreatic ducts were obtained, and three-dimensional MRCP images were rendered by post processing. CONTRAST:  56mL MULTIHANCE GADOBENATE DIMEGLUMINE 529 MG/ML IV SOLN COMPARISON:  Ultrasound exam from earlier the same day. FINDINGS: Lower chest: Trace dependent atelectasis.  Otherwise unremarkable. Hepatobiliary: Liver measures 20.8 cm craniocaudal length. No focal abnormality within the liver parenchyma. Gallbladder shows diffuse wall thickening as noted on recent ultrasound exam. There is some sludge in the neck of the gallbladder without a definite gallstone evident. There is no intra or extrahepatic biliary duct dilatation. No evidence for choledocholithiasis. Pancreas: No focal mass lesion. No dilatation of the main duct. No intraparenchymal cyst. No peripancreatic edema. Spleen:  No splenomegaly. No focal mass lesion. Adrenals/Urinary Tract: No adrenal nodule or mass. Right kidney unremarkable. 6 mm simple cyst noted in the interpolar left kidney with another 2.1 cm cyst in the lateral aspect of the left kidney. Stomach/Bowel: 5.3 x 5.1 x 6.6 cm well-defined homogeneous mass is identified in the upper abdomen, anterior to the esophagogastric junction and displacing the lateral  segment of the left liver anteriorly and the mid stomach laterally. Postcontrast T1 weighted imaging shows preservation of the fat plane between the lesion and the left liver. Coronal post-contrast imaging suggests that the lesion arises from the distal esophagus or proximal stomach in the region of the esophagogastric junction (see image 47 of coronal postcontrast series 25). The lesion has intermediate signal intensity on both T1 and T2 weighted imaging. No discernible enhancement within the lesion after IV contrast administration. Stomach is otherwise unremarkable. Duodenum is normally positioned as is the ligament of Treitz. No small bowel or colonic dilatation within the visualized abdomen. Vascular/Lymphatic: No abdominal aortic aneurysm. Portal vein, superior mesenteric vein, and splenic vein are patent. Celiac axis and SMA enhance after IV contrast administration. No abdominal lymphadenopathy Other:  No intraperitoneal free fluid. Musculoskeletal: Susceptibility artifact from lumbar fusion hardware evident. No abnormal marrow enhancement within the visualized bony anatomy. IMPRESSION: 1. 6.6 cm well defined, homogeneous, nonenhancing mass is identified anterior to the esophagogastric junction, generating mass-effect on the left liver and proximal/mid stomach. Lesion  does not appear to be hepatic in origin, but coronal imaging suggests origin from distal esophagus/esophagogastric junction. 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 Esophageal or gastric duplication cyst would be a consideration. Given the marked homogeneity of the lesion and lack of discernible enhancement, GI stromal tumor considered less likely. Upper endoscopy with endoscopic ultrasound should be considered. 2. Diffuse gallbladder wall thickening with dependent sludge in the gallbladder lumen. No definite gallstones identified. There is no  intra or extrahepatic biliary duct dilatation and no MR findings of choledocholithiasis. 3. Normal MR appearance of the pancreas with no ductal dilatation, mass lesion, or peripancreatic edema/inflammation. Pancreatic parenchyma enhances throughout. Electronically Signed   By: Misty Stanley M.D.   On: 06/02/2017 11:32   US Abdomen Limited Ruq  Result Date: 06/02/2017 CLINICAL DATA:  Intermittent RIGHT upper quadrant abdominal pain for 2 weeks, worsening today. History of gallstones. EXAM: ULTRASOUND ABDOMEN LIMITED RIGHT UPPER QUADRANT COMPARISON:  Abdominal ultrasound May 28, 2017. FINDINGS: Gallbladder: Gallbladder wall is thickened at 5 mm. Sludge ball. 5 mm echogenic mural based polyp versus gallstone. Pericholecystic fluid. No sonographic Murphy's sign elicited. Common bile duct: Diameter: 7 mm.  No choledocholithiasis. Liver: 15 mm echogenic focus adjacent to gallbladder, possible hemangioma. Normal limits in parenchymal echogenicity. Portal vein is patent on color Doppler imaging with normal direction of blood flow towards the liver. IMPRESSION: Stable examination: Sonographic findings of cholecystitis without sonographic Murphy sign to confirm acute component. Electronically Signed   By: Elon Alas M.D.   On: 06/02/2017 03:25    EKG:   Orders placed or performed during the hospital encounter of 06/02/17  . EKG 12-Lead  . EKG 12-Lead  . ED EKG  . ED EKG  . EKG      Management plans discussed with the patient, family and they are in agreement.  CODE STATUS:     Code Status Orders  (From admission, onward)        Start     Ordered   06/02/17 0510  Full code  Continuous     06/02/17 0509    Code Status History    Date Active Date Inactive Code Status Order ID Comments User Context   07/30/2016 1219 07/31/2016 1836 Full Code [id number]  Traci Sermon, PA-C Inpatient      TOTAL TIME TAKING CARE OF THIS PATIENT: 45  minutes.   Note: This dictation was prepared  with Dragon dictation along with smaller phrase technology. Any transcriptional errors that result from this process are unintentional.   @[facility] @  on 06/04/2017 at 1:13 PM  Between 7am to 6pm - Pager - [phone]  After 6pm go to [email or web address] - password [email or web address] [facility]  [facility]  Office  [phone]  CC: Primary care physician; Carmon Ginsberg, [place]

## 2017-06-05 ENCOUNTER — Telehealth: Payer: Self-pay

## 2017-06-05 LAB — IGG, IGA, IGM
IgA: 367 mg/dL (ref 61–437)
IgG (Immunoglobin G), Serum: 1298 mg/dL (ref 700–1600)
IgM (Immunoglobulin M), Srm: 162 mg/dL — ABNORMAL HIGH (ref 15–143)

## 2017-06-05 LAB — CERULOPLASMIN: Ceruloplasmin: 30 mg/dL (ref 16.0–31.0)

## 2017-06-05 NOTE — Telephone Encounter (Signed)
I see in the hospital discharge summary that he was expected to f/u with surgery in 10 days after discharge-about when he finishes the Cipro. IF there is a question about this would want him to call the surgeon's office to check.

## 2017-06-05 NOTE — Telephone Encounter (Signed)
Transition Care Management Follow-Up Telephone Call   Date discharged and where: [facility] on 06/04/17.  How have you been since you were released from the hospital? Doing well, no current pain or other s/s.   Any patient concerns? None.   Items Reviewed:   Meds: verified  Allergies: verified  Dietary Changes Reviewed: decrease fat intake  Functional Questionnaire:  Independent-I Dependent-D  ADLs:   Dressing- I    Eating- I   Maintaining continence- I   Transferring- I   Transportation- I   Meal Prep- I   Managing Meds- I   Confirmed importance and Date/Time of follow-up visits scheduled: 06/11/17 @ 11:00 AM.    Confirmed with patient if condition worsens to call PCP or go to the Emergency Dept. Patient was given office number and encouraged to call back with questions or concerns: YES

## 2017-06-05 NOTE — Telephone Encounter (Signed)
Mr. Schwer has a surgical problem and should have a f/u scheduled with his surgeon. He does not need follow up with me.

## 2017-06-05 NOTE — Telephone Encounter (Signed)
Left message with wife to have husband call office back, advised her of message below to cancel appt with Mikki Santee and follow up with surgeon. KW

## 2017-06-05 NOTE — Telephone Encounter (Signed)
Pt.[email or web address].KW 

## 2017-06-05 NOTE — Telephone Encounter (Signed)
Patient advised. He states his follow up with his surgeon is in May. Patient requested to keep follow up with Rodney Hall. He states he wants Rodney Hall to explain his medications to him and also has some other questions for him.

## 2017-06-06 ENCOUNTER — Ambulatory Visit: Payer: Self-pay | Admitting: Family Medicine

## 2017-06-06 LAB — ANTINUCLEAR ANTIBODIES, IFA: ANA Ab, IFA: NEGATIVE

## 2017-06-06 LAB — MITOCHONDRIAL ANTIBODIES: Mitochondrial M2 Ab, IgG: <20 U (ref 0.0–20.0)

## 2017-06-06 LAB — ANTI-SMOOTH MUSCLE ANTIBODY, IGG: F-Actin IgG: 12 U (ref 0–19)

## 2017-06-07 LAB — CULTURE, BLOOD (ROUTINE X 2)
Culture: NO GROWTH
Culture: NO GROWTH
Special Requests: ADEQUATE
Special Requests: ADEQUATE

## 2017-06-11 ENCOUNTER — Inpatient Hospital Stay: Payer: Self-pay | Admitting: Family Medicine

## 2017-06-17 DIAGNOSIS — L570 Actinic keratosis: Secondary | ICD-10-CM | POA: Diagnosis not present

## 2017-06-17 DIAGNOSIS — L578 Other skin changes due to chronic exposure to nonionizing radiation: Secondary | ICD-10-CM | POA: Diagnosis not present

## 2017-06-17 DIAGNOSIS — Z85828 Personal history of other malignant neoplasm of skin: Secondary | ICD-10-CM | POA: Diagnosis not present

## 2017-06-30 DIAGNOSIS — M7540 Impingement syndrome of unspecified shoulder: Secondary | ICD-10-CM | POA: Insufficient documentation

## 2017-07-02 ENCOUNTER — Encounter: Payer: Self-pay | Admitting: Gastroenterology

## 2017-07-02 ENCOUNTER — Ambulatory Visit (INDEPENDENT_AMBULATORY_CARE_PROVIDER_SITE_OTHER): Payer: PPO | Admitting: Gastroenterology

## 2017-07-02 ENCOUNTER — Other Ambulatory Visit
Admission: RE | Admit: 2017-07-02 | Discharge: 2017-07-02 | Disposition: A | Discharge status: Home / Self Care | Payer: PPO | Source: Ambulatory Visit | Attending: Gastroenterology | Admitting: Gastroenterology

## 2017-07-02 VITALS — BP 131/77 | HR 82 | Ht 71.0 in | Wt 189.6 lb

## 2017-07-02 DIAGNOSIS — K8050 Calculus of bile duct without cholangitis or cholecystitis without obstruction: Secondary | ICD-10-CM | POA: Insufficient documentation

## 2017-07-02 DIAGNOSIS — R9389 Abnormal findings on diagnostic imaging of other specified body structures: Secondary | ICD-10-CM | POA: Diagnosis not present

## 2017-07-02 LAB — COMPREHENSIVE METABOLIC PANEL WITH GFR
ALT: 12 U/L — ABNORMAL LOW (ref 17–63)
AST: 21 U/L (ref 15–41)
Albumin: 3.7 g/dL (ref 3.5–5.0)
Alkaline Phosphatase: 87 U/L (ref 38–126)
Anion gap: 6 (ref 5–15)
BUN: 14 mg/dL (ref 6–20)
CO2: 24 mmol/L (ref 22–32)
Calcium: 8.7 mg/dL — ABNORMAL LOW (ref 8.9–10.3)
Chloride: 108 mmol/L (ref 101–111)
Creatinine, Ser: 1.38 mg/dL — ABNORMAL HIGH (ref 0.61–1.24)
GFR calc Af Amer: 58 mL/min — ABNORMAL LOW
GFR calc non Af Amer: 50 mL/min — ABNORMAL LOW
Glucose, Bld: 130 mg/dL — ABNORMAL HIGH (ref 65–99)
Potassium: 3.7 mmol/L (ref 3.5–5.1)
Sodium: 138 mmol/L (ref 135–145)
Total Bilirubin: 0.7 mg/dL (ref 0.3–1.2)
Total Protein: 7.4 g/dL (ref 6.5–8.1)

## 2017-07-02 NOTE — Progress Notes (Signed)
Jonathon Bellows MD, MRCP(U.K) [address]  [place]  [place], [place] [address]  Main: [phone]  Fax: [phone]   Primary Care Physician: Carmon Ginsberg, [place]  Primary Gastroenterologist:  Dr. Jonathon Bellows   No chief complaint on file.   HPI: Rodney Ground. is a 72 y.o. 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 On admission he had an elevated WCC , alk phos , ALT,AST, T bilirubin . USG gall bladder showed cholecystitis . 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 Lesion felt to be hepatic in origin . No choledocholithiasis. He was seen by Dr Hampton Abbot in surgery - no plan for surgery during the admission   Interval history   06/02/17-07/02/17   Doing well generally , had another episode of epiagstric pain radiating to his back 10 days back, similar to his hospital admission which has resolved. Recalls 25 years back had an episode of pancreatitis similar pain to present, complicated with a pseudocyst and subsequent surgical drainage.    Hepatic Function Latest Ref Rng & Units 06/04/2017 06/03/2017 06/02/2017  Total Protein 6.5 - 8.1 g/dL 7.3 7.0 9.2(H)  Albumin 3.5 - 5.0 g/dL 3.1(L) 3.0(L) 3.9  AST 15 - 41 U/L 83(H) 183(H) 413(H)  ALT 17 - 63 U/L 175(H) 245(H) 290(H)  Alk Phosphatase 38 - 126 U/L 287(H) 298(H) 361(H)  Total Bilirubin 0.3 - 1.2 mg/dL 1.2 4.0(H) 2.2(H)  Bilirubin, Direct 0.1 - 0.5 mg/dL 0.5 2.6(H) 1.6(H)     Current Outpatient Medications  Medication Sig Dispense Refill  . HYDROcodone-acetaminophen (NORCO/VICODIN) 5-325 MG tablet Take 1-2 tablets by mouth every 6 (six) hours as needed for moderate pain. 20 tablet 0  . imipramine (TOFRANIL) 50 MG tablet Take 50 mg by mouth at bedtime.     . pentosan polysulfate (ELMIRON) 100 MG capsule Take 100 mg by mouth 3  (three) times daily.     . tamsulosin (FLOMAX) 0.4 MG CAPS capsule Take 0.4 mg by mouth daily after supper.      No current facility-administered medications for this visit.     Allergies as of 07/02/2017 - Review Complete 06/05/2017  Allergen Reaction Noted  . Amoxicillin Rash 04/06/2015    ROS:  General: Negative for anorexia, weight loss, fever, chills, fatigue, weakness. ENT: Negative for hoarseness, difficulty swallowing , nasal congestion. CV: Negative for chest pain, angina, palpitations, dyspnea on exertion, peripheral edema.  Respiratory: Negative for dyspnea at rest, dyspnea on exertion, cough, sputum, wheezing.  GI: See history of present illness. GU:  Negative for dysuria, hematuria, urinary incontinence, urinary frequency, nocturnal urination.  Endo: Negative for unusual weight change.    Physical Examination:   There were no vitals taken for this visit.  General: Well-nourished, well-developed in no acute distress.  Eyes: No icterus. Conjunctivae pink. Mouth: Oropharyngeal mucosa moist and pink , no lesions erythema or exudate. Lungs: Clear to auscultation bilaterally. Non-labored. Heart: Regular rate and rhythm, no murmurs rubs or gallops.  Abdomen: Bowel sounds are normal, nontender, nondistended, no hepatosplenomegaly or masses, no abdominal bruits or hernia , no rebound or guarding.   Extremities: No lower extremity edema. No clubbing or deformities. Neuro: Alert and oriented x 3.  Grossly intact. Skin: Warm and dry, no jaundice.   Psych:  Alert and cooperative, normal mood and affect.   Imaging Studies: Mr 3d Recon At Scanner  Result Date: 06/02/2017 CLINICAL DATA:  Acute onset epigastric pain with elevated LFTs and lipase level. Possible gallstones on ultrasound. EXAM: MRI ABDOMEN WITHOUT AND WITH CONTRAST (INCLUDING MRCP) TECHNIQUE: Multiplanar multisequence MR imaging of the abdomen was performed both before and after the administration of intravenous  contrast. Heavily T2-weighted images of the biliary and pancreatic ducts were obtained, and three-dimensional MRCP images were rendered by post processing. CONTRAST:  56m MULTIHANCE GADOBENATE DIMEGLUMINE 529 MG/ML IV SOLN COMPARISON:  Ultrasound exam from earlier the same day. FINDINGS: Lower chest: Trace dependent atelectasis.  Otherwise unremarkable. Hepatobiliary: Liver measures 20.8 cm craniocaudal length. No focal abnormality within the liver parenchyma. Gallbladder shows diffuse wall thickening as noted on recent ultrasound exam. There is some sludge in the neck of the gallbladder without a definite gallstone evident. There is no intra or extrahepatic biliary duct dilatation. No evidence for choledocholithiasis. Pancreas: No focal mass lesion. No dilatation of the main duct. No intraparenchymal cyst. No peripancreatic edema. Spleen:  No splenomegaly. No focal mass lesion. Adrenals/Urinary Tract: No adrenal nodule or mass. Right kidney unremarkable. 6 mm simple cyst noted in the interpolar left kidney with another 2.1 cm cyst in the lateral aspect of the left kidney. Stomach/Bowel: 5.3 x 5.1 x 6.6 cm well-defined homogeneous mass is identified in the upper abdomen, anterior to the esophagogastric junction and displacing the lateral segment of the left liver anteriorly and the mid stomach laterally. Postcontrast T1 weighted imaging shows preservation of the fat plane between the lesion and the left liver. Coronal post-contrast imaging suggests that the lesion arises from the distal esophagus or proximal stomach in the region of the esophagogastric junction (see image 47 of coronal postcontrast series 25). The lesion has intermediate signal intensity on both T1 and T2 weighted imaging. No discernible enhancement within the lesion after IV contrast administration. Stomach is otherwise unremarkable. Duodenum is normally positioned as is the ligament of Treitz. No small bowel or colonic dilatation within the  visualized abdomen. Vascular/Lymphatic: No abdominal aortic aneurysm. Portal vein, superior mesenteric vein, and splenic vein are patent. Celiac axis and SMA enhance after IV contrast administration. No abdominal lymphadenopathy Other:  No intraperitoneal free fluid. Musculoskeletal: Susceptibility artifact from lumbar fusion hardware evident. No abnormal marrow enhancement within the visualized bony anatomy. IMPRESSION: 1. 6.6 cm well defined, homogeneous, nonenhancing mass is identified anterior to the esophagogastric junction, generating mass-effect on the left liver and proximal/mid stomach. Lesion does not appear to be hepatic in origin, but coronal imaging suggests origin from distal esophagus/esophagogastric junction. 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 Esophageal or gastric duplication cyst would be a consideration. Given the marked homogeneity of the lesion and lack of discernible enhancement, GI stromal tumor considered less likely. Upper endoscopy with endoscopic ultrasound should be considered. 2. Diffuse gallbladder wall thickening with dependent sludge in the gallbladder lumen. No definite gallstones identified. There is no intra or extrahepatic biliary duct dilatation and no MR findings of choledocholithiasis. 3. Normal MR appearance of the pancreas with no ductal dilatation, mass lesion, or peripancreatic edema/inflammation. Pancreatic parenchyma enhances throughout. Electronically Signed   By: EMisty StanleyM.D.   On: 06/02/2017 11:32   Nm Hepato W/eject Fract  Result Date: 06/02/2017 CLINICAL DATA:  Epigastric pain. Gallbladder wall thickening on MRI. Debris versus sludge versus stones within the lumen gallbladder. EXAM: NUCLEAR  MEDICINE HEPATOBILIARY IMAGING TECHNIQUE: Sequential images of the abdomen were obtained out to 60 minutes following intravenous administration of radiopharmaceutical.  RADIOPHARMACEUTICALS:  5.4 mCi Tc-28m Choletec IV COMPARISON:  Ultrasound 06/02/2017, MRI 06/02/2017 FINDINGS: Prior radiotracer accumulation within the liver. No counts evident at 1 hour period. At 2 hour interval, no counts within the bowel. The gallbladder does not fill at 2 hours. On the MRI imaging of same day there is no evidence of common bile duct obstruction. No choledocholithiasis or intrahepatic duct dilatation. IMPRESSION: Not excretion of radiotracer into the common hepatic duct or common bile duct is most consistent with hepatic dysfunction versus extrahepatic ductal obstruction. As there is no evidence obstruction on same day MRI would favor hepatic dysfunction such as hepatitis or cholestasis. Early ductal obstruction could have the similar appearance. Gallbladder cannot be evaluated with out biliary excretion. Cannot exclude acute cholecystitis. These results will be called to the ordering clinician or representative by the Radiologist Assistant, and communication documented in the PACS or zVision Dashboard. Electronically Signed   By: SSuzy BouchardM.D.   On: 06/02/2017 18:30   Mr Abdomen Mrcp W Wo Contast  Result Date: 06/02/2017 CLINICAL DATA:  Acute onset epigastric pain with elevated LFTs and lipase level. Possible gallstones on ultrasound. EXAM: MRI ABDOMEN WITHOUT AND WITH CONTRAST (INCLUDING MRCP) TECHNIQUE: Multiplanar multisequence MR imaging of the abdomen was performed both before and after the administration of intravenous contrast. Heavily T2-weighted images of the biliary and pancreatic ducts were obtained, and three-dimensional MRCP images were rendered by post processing. CONTRAST:  152mMULTIHANCE GADOBENATE DIMEGLUMINE 529 MG/ML IV SOLN COMPARISON:  Ultrasound exam from earlier the same day. FINDINGS: Lower chest: Trace dependent atelectasis.  Otherwise unremarkable. Hepatobiliary: Liver measures 20.8 cm craniocaudal length. No focal abnormality within the liver parenchyma.  Gallbladder shows diffuse wall thickening as noted on recent ultrasound exam. There is some sludge in the neck of the gallbladder without a definite gallstone evident. There is no intra or extrahepatic biliary duct dilatation. No evidence for choledocholithiasis. Pancreas: No focal mass lesion. No dilatation of the main duct. No intraparenchymal cyst. No peripancreatic edema. Spleen:  No splenomegaly. No focal mass lesion. Adrenals/Urinary Tract: No adrenal nodule or mass. Right kidney unremarkable. 6 mm simple cyst noted in the interpolar left kidney with another 2.1 cm cyst in the lateral aspect of the left kidney. Stomach/Bowel: 5.3 x 5.1 x 6.6 cm well-defined homogeneous mass is identified in the upper abdomen, anterior to the esophagogastric junction and displacing the lateral segment of the left liver anteriorly and the mid stomach laterally. Postcontrast T1 weighted imaging shows preservation of the fat plane between the lesion and the left liver. Coronal post-contrast imaging suggests that the lesion arises from the distal esophagus or proximal stomach in the region of the esophagogastric junction (see image 47 of coronal postcontrast series 25). The lesion has intermediate signal intensity on both T1 and T2 weighted imaging. No discernible enhancement within the lesion after IV contrast administration. Stomach is otherwise unremarkable. Duodenum is normally positioned as is the ligament of Treitz. No small bowel or colonic dilatation within the visualized abdomen. Vascular/Lymphatic: No abdominal aortic aneurysm. Portal vein, superior mesenteric vein, and splenic vein are patent. Celiac axis and SMA enhance after IV contrast administration. No abdominal lymphadenopathy Other:  No intraperitoneal free fluid. Musculoskeletal: Susceptibility artifact from lumbar fusion hardware evident. No abnormal marrow enhancement within the visualized bony anatomy. IMPRESSION: 1. 6.6 cm well defined, homogeneous,  nonenhancing mass is identified anterior to  the esophagogastric junction, generating mass-effect on the left liver and proximal/mid stomach. Lesion does not appear to be hepatic in origin, but coronal imaging suggests origin from distal esophagus/esophagogastric junction. 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 Esophageal or gastric duplication cyst would be a consideration. Given the marked homogeneity of the lesion and lack of discernible enhancement, GI stromal tumor considered less likely. Upper endoscopy with endoscopic ultrasound should be considered. 2. Diffuse gallbladder wall thickening with dependent sludge in the gallbladder lumen. No definite gallstones identified. There is no intra or extrahepatic biliary duct dilatation and no MR findings of choledocholithiasis. 3. Normal MR appearance of the pancreas with no ductal dilatation, mass lesion, or peripancreatic edema/inflammation. Pancreatic parenchyma enhances throughout. Electronically Signed   By: Misty Stanley M.D.   On: 06/02/2017 11:32    Assessment and Plan:   Rodney Gellert. is a 72 y.o. y/o male here to follow up to his recent hospital discharge when he was admitted with a brief history of abdominal pain , abnormal LFT's and T bilirubin which began to normalize towards discharge , cholecystitis , sludge seen on imaging. MRCP picked up a 6.6 cm mass at the GE junction appears extra luminal , with mass effect on the liver. 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   Plan  1. Schedule follow up with Dr Hampton Abbot for evaluation for Cholecystectomy  2. Recheck LFT's if normalized suggests either effects of cholecystitis or passage of a gall stone and both would be an indication for cholecystectomy , He has had an episode of pancreatitis in the past and could have been from his sludge. I did  explain to him that most causes of pancreatitis is more likely from microlithiasis than the larger gall stones.  3. EUS to evaluate the abnormal appearing lesion at the GE junction with mass effect on the liver. EUS to determine origin of the lesion as well as for biopsy.   Dr Jonathon Bellows  MD,MRCP [facility]) Follow up in 6 weeks

## 2017-07-02 NOTE — H&P (View-Only) (Signed)
Jonathon Bellows MD, MRCP(U.K) [address]  [place]  [place], [place] [address]  Main: [phone]  Fax: [phone]   Primary Care Physician: Carmon Ginsberg, [place]  Primary Gastroenterologist:  Dr. Jonathon Bellows   No chief complaint on file.   HPI: Devontaye Ground. is a 72 y.o. 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 On admission he had an elevated WCC , alk phos , ALT,AST, T bilirubin . USG gall bladder showed cholecystitis . 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 Lesion felt to be hepatic in origin . No choledocholithiasis. He was seen by Dr Hampton Abbot in surgery - no plan for surgery during the admission   Interval history   06/02/17-07/02/17   Doing well generally , had another episode of epiagstric pain radiating to his back 10 days back, similar to his hospital admission which has resolved. Recalls 25 years back had an episode of pancreatitis similar pain to present, complicated with a pseudocyst and subsequent surgical drainage.    Hepatic Function Latest Ref Rng & Units 06/04/2017 06/03/2017 06/02/2017  Total Protein 6.5 - 8.1 g/dL 7.3 7.0 9.2(H)  Albumin 3.5 - 5.0 g/dL 3.1(L) 3.0(L) 3.9  AST 15 - 41 U/L 83(H) 183(H) 413(H)  ALT 17 - 63 U/L 175(H) 245(H) 290(H)  Alk Phosphatase 38 - 126 U/L 287(H) 298(H) 361(H)  Total Bilirubin 0.3 - 1.2 mg/dL 1.2 4.0(H) 2.2(H)  Bilirubin, Direct 0.1 - 0.5 mg/dL 0.5 2.6(H) 1.6(H)     Current Outpatient Medications  Medication Sig Dispense Refill  . HYDROcodone-acetaminophen (NORCO/VICODIN) 5-325 MG tablet Take 1-2 tablets by mouth every 6 (six) hours as needed for moderate pain. 20 tablet 0  . imipramine (TOFRANIL) 50 MG tablet Take 50 mg by mouth at bedtime.     . pentosan polysulfate (ELMIRON) 100 MG capsule Take 100 mg by mouth 3  (three) times daily.     . tamsulosin (FLOMAX) 0.4 MG CAPS capsule Take 0.4 mg by mouth daily after supper.      No current facility-administered medications for this visit.     Allergies as of 07/02/2017 - Review Complete 06/05/2017  Allergen Reaction Noted  . Amoxicillin Rash 04/06/2015    ROS:  General: Negative for anorexia, weight loss, fever, chills, fatigue, weakness. ENT: Negative for hoarseness, difficulty swallowing , nasal congestion. CV: Negative for chest pain, angina, palpitations, dyspnea on exertion, peripheral edema.  Respiratory: Negative for dyspnea at rest, dyspnea on exertion, cough, sputum, wheezing.  GI: See history of present illness. GU:  Negative for dysuria, hematuria, urinary incontinence, urinary frequency, nocturnal urination.  Endo: Negative for unusual weight change.    Physical Examination:   There were no vitals taken for this visit.  General: Well-nourished, well-developed in no acute distress.  Eyes: No icterus. Conjunctivae pink. Mouth: Oropharyngeal mucosa moist and pink , no lesions erythema or exudate. Lungs: Clear to auscultation bilaterally. Non-labored. Heart: Regular rate and rhythm, no murmurs rubs or gallops.  Abdomen: Bowel sounds are normal, nontender, nondistended, no hepatosplenomegaly or masses, no abdominal bruits or hernia , no rebound or guarding.   Extremities: No lower extremity edema. No clubbing or deformities. Neuro: Alert and oriented x 3.  Grossly intact. Skin: Warm and dry, no jaundice.   Psych:  Alert and cooperative, normal mood and affect.   Imaging Studies: Mr 3d Recon At Scanner  Result Date: 06/02/2017 CLINICAL DATA:  Acute onset epigastric pain with elevated LFTs and lipase level. Possible gallstones on ultrasound. EXAM: MRI ABDOMEN WITHOUT AND WITH CONTRAST (INCLUDING MRCP) TECHNIQUE: Multiplanar multisequence MR imaging of the abdomen was performed both before and after the administration of intravenous  contrast. Heavily T2-weighted images of the biliary and pancreatic ducts were obtained, and three-dimensional MRCP images were rendered by post processing. CONTRAST:  56m MULTIHANCE GADOBENATE DIMEGLUMINE 529 MG/ML IV SOLN COMPARISON:  Ultrasound exam from earlier the same day. FINDINGS: Lower chest: Trace dependent atelectasis.  Otherwise unremarkable. Hepatobiliary: Liver measures 20.8 cm craniocaudal length. No focal abnormality within the liver parenchyma. Gallbladder shows diffuse wall thickening as noted on recent ultrasound exam. There is some sludge in the neck of the gallbladder without a definite gallstone evident. There is no intra or extrahepatic biliary duct dilatation. No evidence for choledocholithiasis. Pancreas: No focal mass lesion. No dilatation of the main duct. No intraparenchymal cyst. No peripancreatic edema. Spleen:  No splenomegaly. No focal mass lesion. Adrenals/Urinary Tract: No adrenal nodule or mass. Right kidney unremarkable. 6 mm simple cyst noted in the interpolar left kidney with another 2.1 cm cyst in the lateral aspect of the left kidney. Stomach/Bowel: 5.3 x 5.1 x 6.6 cm well-defined homogeneous mass is identified in the upper abdomen, anterior to the esophagogastric junction and displacing the lateral segment of the left liver anteriorly and the mid stomach laterally. Postcontrast T1 weighted imaging shows preservation of the fat plane between the lesion and the left liver. Coronal post-contrast imaging suggests that the lesion arises from the distal esophagus or proximal stomach in the region of the esophagogastric junction (see image 47 of coronal postcontrast series 25). The lesion has intermediate signal intensity on both T1 and T2 weighted imaging. No discernible enhancement within the lesion after IV contrast administration. Stomach is otherwise unremarkable. Duodenum is normally positioned as is the ligament of Treitz. No small bowel or colonic dilatation within the  visualized abdomen. Vascular/Lymphatic: No abdominal aortic aneurysm. Portal vein, superior mesenteric vein, and splenic vein are patent. Celiac axis and SMA enhance after IV contrast administration. No abdominal lymphadenopathy Other:  No intraperitoneal free fluid. Musculoskeletal: Susceptibility artifact from lumbar fusion hardware evident. No abnormal marrow enhancement within the visualized bony anatomy. IMPRESSION: 1. 6.6 cm well defined, homogeneous, nonenhancing mass is identified anterior to the esophagogastric junction, generating mass-effect on the left liver and proximal/mid stomach. Lesion does not appear to be hepatic in origin, but coronal imaging suggests origin from distal esophagus/esophagogastric junction. 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 Esophageal or gastric duplication cyst would be a consideration. Given the marked homogeneity of the lesion and lack of discernible enhancement, GI stromal tumor considered less likely. Upper endoscopy with endoscopic ultrasound should be considered. 2. Diffuse gallbladder wall thickening with dependent sludge in the gallbladder lumen. No definite gallstones identified. There is no intra or extrahepatic biliary duct dilatation and no MR findings of choledocholithiasis. 3. Normal MR appearance of the pancreas with no ductal dilatation, mass lesion, or peripancreatic edema/inflammation. Pancreatic parenchyma enhances throughout. Electronically Signed   By: EMisty StanleyM.D.   On: 06/02/2017 11:32   Nm Hepato W/eject Fract  Result Date: 06/02/2017 CLINICAL DATA:  Epigastric pain. Gallbladder wall thickening on MRI. Debris versus sludge versus stones within the lumen gallbladder. EXAM: NUCLEAR  MEDICINE HEPATOBILIARY IMAGING TECHNIQUE: Sequential images of the abdomen were obtained out to 60 minutes following intravenous administration of radiopharmaceutical.  RADIOPHARMACEUTICALS:  5.4 mCi Tc-28m Choletec IV COMPARISON:  Ultrasound 06/02/2017, MRI 06/02/2017 FINDINGS: Prior radiotracer accumulation within the liver. No counts evident at 1 hour period. At 2 hour interval, no counts within the bowel. The gallbladder does not fill at 2 hours. On the MRI imaging of same day there is no evidence of common bile duct obstruction. No choledocholithiasis or intrahepatic duct dilatation. IMPRESSION: Not excretion of radiotracer into the common hepatic duct or common bile duct is most consistent with hepatic dysfunction versus extrahepatic ductal obstruction. As there is no evidence obstruction on same day MRI would favor hepatic dysfunction such as hepatitis or cholestasis. Early ductal obstruction could have the similar appearance. Gallbladder cannot be evaluated with out biliary excretion. Cannot exclude acute cholecystitis. These results will be called to the ordering clinician or representative by the Radiologist Assistant, and communication documented in the PACS or zVision Dashboard. Electronically Signed   By: SSuzy BouchardM.D.   On: 06/02/2017 18:30   Mr Abdomen Mrcp W Wo Contast  Result Date: 06/02/2017 CLINICAL DATA:  Acute onset epigastric pain with elevated LFTs and lipase level. Possible gallstones on ultrasound. EXAM: MRI ABDOMEN WITHOUT AND WITH CONTRAST (INCLUDING MRCP) TECHNIQUE: Multiplanar multisequence MR imaging of the abdomen was performed both before and after the administration of intravenous contrast. Heavily T2-weighted images of the biliary and pancreatic ducts were obtained, and three-dimensional MRCP images were rendered by post processing. CONTRAST:  152mMULTIHANCE GADOBENATE DIMEGLUMINE 529 MG/ML IV SOLN COMPARISON:  Ultrasound exam from earlier the same day. FINDINGS: Lower chest: Trace dependent atelectasis.  Otherwise unremarkable. Hepatobiliary: Liver measures 20.8 cm craniocaudal length. No focal abnormality within the liver parenchyma.  Gallbladder shows diffuse wall thickening as noted on recent ultrasound exam. There is some sludge in the neck of the gallbladder without a definite gallstone evident. There is no intra or extrahepatic biliary duct dilatation. No evidence for choledocholithiasis. Pancreas: No focal mass lesion. No dilatation of the main duct. No intraparenchymal cyst. No peripancreatic edema. Spleen:  No splenomegaly. No focal mass lesion. Adrenals/Urinary Tract: No adrenal nodule or mass. Right kidney unremarkable. 6 mm simple cyst noted in the interpolar left kidney with another 2.1 cm cyst in the lateral aspect of the left kidney. Stomach/Bowel: 5.3 x 5.1 x 6.6 cm well-defined homogeneous mass is identified in the upper abdomen, anterior to the esophagogastric junction and displacing the lateral segment of the left liver anteriorly and the mid stomach laterally. Postcontrast T1 weighted imaging shows preservation of the fat plane between the lesion and the left liver. Coronal post-contrast imaging suggests that the lesion arises from the distal esophagus or proximal stomach in the region of the esophagogastric junction (see image 47 of coronal postcontrast series 25). The lesion has intermediate signal intensity on both T1 and T2 weighted imaging. No discernible enhancement within the lesion after IV contrast administration. Stomach is otherwise unremarkable. Duodenum is normally positioned as is the ligament of Treitz. No small bowel or colonic dilatation within the visualized abdomen. Vascular/Lymphatic: No abdominal aortic aneurysm. Portal vein, superior mesenteric vein, and splenic vein are patent. Celiac axis and SMA enhance after IV contrast administration. No abdominal lymphadenopathy Other:  No intraperitoneal free fluid. Musculoskeletal: Susceptibility artifact from lumbar fusion hardware evident. No abnormal marrow enhancement within the visualized bony anatomy. IMPRESSION: 1. 6.6 cm well defined, homogeneous,  nonenhancing mass is identified anterior to  the esophagogastric junction, generating mass-effect on the left liver and proximal/mid stomach. Lesion does not appear to be hepatic in origin, but coronal imaging suggests origin from distal esophagus/esophagogastric junction. 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 Esophageal or gastric duplication cyst would be a consideration. Given the marked homogeneity of the lesion and lack of discernible enhancement, GI stromal tumor considered less likely. Upper endoscopy with endoscopic ultrasound should be considered. 2. Diffuse gallbladder wall thickening with dependent sludge in the gallbladder lumen. No definite gallstones identified. There is no intra or extrahepatic biliary duct dilatation and no MR findings of choledocholithiasis. 3. Normal MR appearance of the pancreas with no ductal dilatation, mass lesion, or peripancreatic edema/inflammation. Pancreatic parenchyma enhances throughout. Electronically Signed   By: Misty Stanley M.D.   On: 06/02/2017 11:32    Assessment and Plan:   Khamron Gellert. is a 72 y.o. y/o male here to follow up to his recent hospital discharge when he was admitted with a brief history of abdominal pain , abnormal LFT's and T bilirubin which began to normalize towards discharge , cholecystitis , sludge seen on imaging. MRCP picked up a 6.6 cm mass at the GE junction appears extra luminal , with mass effect on the liver. 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   Plan  1. Schedule follow up with Dr Hampton Abbot for evaluation for Cholecystectomy  2. Recheck LFT's if normalized suggests either effects of cholecystitis or passage of a gall stone and both would be an indication for cholecystectomy , He has had an episode of pancreatitis in the past and could have been from his sludge. I did  explain to him that most causes of pancreatitis is more likely from microlithiasis than the larger gall stones.  3. EUS to evaluate the abnormal appearing lesion at the GE junction with mass effect on the liver. EUS to determine origin of the lesion as well as for biopsy.   Dr Jonathon Bellows  MD,MRCP [facility]) Follow up in 6 weeks

## 2017-07-03 ENCOUNTER — Telehealth: Payer: Self-pay

## 2017-07-03 NOTE — Telephone Encounter (Signed)
Received referral from Dr. Vicente Males for EUS. No availability at [place] [facility] until 5-30. Spoke with Rodney Hall and he will have his EUS in [place] with Dr. Ardis Hughs. Referral sent to Dr. Ardis Hughs. Oncology Nurse Navigator Documentation  Navigator Location: [facility] (07/03/17 1000)   )Navigator Encounter Type: Telephone (07/03/17 1000) Telephone: Outgoing Call (07/03/17 1000)                       Barriers/Navigation Needs: Coordination of Care (07/03/17 1000)   Interventions: Coordination of Care (07/03/17 1000)   Coordination of Care: EUS (07/03/17 1000)                  Time Spent with Patient: 30 (07/03/17 1000)

## 2017-07-04 ENCOUNTER — Other Ambulatory Visit: Payer: Self-pay

## 2017-07-04 ENCOUNTER — Telehealth: Payer: Self-pay

## 2017-07-04 DIAGNOSIS — R1900 Intra-abdominal and pelvic swelling, mass and lump, unspecified site: Secondary | ICD-10-CM

## 2017-07-04 DIAGNOSIS — R9389 Abnormal findings on diagnostic imaging of other specified body structures: Secondary | ICD-10-CM

## 2017-07-04 NOTE — Telephone Encounter (Signed)
-----   Message from [name], MD sent at 07/04/2017  9:14 AM EDT ----- Regarding: RE: EUS request Happy to help.  He needs upper EUS, radial +/- linear, next available EUS Thursday with [facility] (not hosp week) for mass in abdomen.  Thanks  ----- Message ----- From: Jeoffrey Massed, RN Sent: 07/03/2017  10:42 AM To: [name], MD Subject: Melton Alar: EUS request                                  ----- Message ----- From: Clent Jacks, RN Sent: 07/03/2017  10:39 AM To: Jeoffrey Massed, RN Subject: EUS request                                    Referral from Dr. Jonathon Bellows  EUS to evaluate the abnormal appearing lesion at the GE junction with mass effect on the liver. EUS to determine origin of the lesion as well as for biopsy.

## 2017-07-04 NOTE — Telephone Encounter (Signed)
EUS scheduled, pt instructed and medications reviewed.  Patient instructions mailed to home.  Patient to call with any questions or concerns.  

## 2017-07-07 ENCOUNTER — Inpatient Hospital Stay: Payer: Self-pay | Admitting: Surgery

## 2017-07-08 ENCOUNTER — Other Ambulatory Visit: Payer: Self-pay

## 2017-07-08 ENCOUNTER — Encounter (HOSPITAL_COMMUNITY): Payer: Self-pay

## 2017-07-10 ENCOUNTER — Other Ambulatory Visit: Payer: Self-pay

## 2017-07-10 ENCOUNTER — Ambulatory Visit (HOSPITAL_COMMUNITY): Payer: PPO | Admitting: Certified Registered Nurse Anesthetist

## 2017-07-10 ENCOUNTER — Encounter (HOSPITAL_COMMUNITY): Payer: Self-pay | Admitting: *Deleted

## 2017-07-10 ENCOUNTER — Ambulatory Visit (HOSPITAL_COMMUNITY)
Admission: RE | Admit: 2017-07-10 | Discharge: 2017-07-10 | Disposition: A | Discharge status: Home / Self Care | Payer: PPO | Source: Ambulatory Visit | Attending: Gastroenterology | Admitting: Gastroenterology

## 2017-07-10 ENCOUNTER — Encounter (HOSPITAL_COMMUNITY)
Admission: RE | Disposition: A | Discharge status: Home / Self Care | Payer: Self-pay | Source: Ambulatory Visit | Attending: Gastroenterology

## 2017-07-10 DIAGNOSIS — K228 Other specified diseases of esophagus: Secondary | ICD-10-CM | POA: Diagnosis present

## 2017-07-10 DIAGNOSIS — N401 Enlarged prostate with lower urinary tract symptoms: Secondary | ICD-10-CM | POA: Diagnosis not present

## 2017-07-10 DIAGNOSIS — R1900 Intra-abdominal and pelvic swelling, mass and lump, unspecified site: Secondary | ICD-10-CM

## 2017-07-10 DIAGNOSIS — R339 Retention of urine, unspecified: Secondary | ICD-10-CM | POA: Diagnosis not present

## 2017-07-10 DIAGNOSIS — K8011 Calculus of gallbladder with chronic cholecystitis with obstruction: Secondary | ICD-10-CM

## 2017-07-10 DIAGNOSIS — R9389 Abnormal findings on diagnostic imaging of other specified body structures: Secondary | ICD-10-CM

## 2017-07-10 DIAGNOSIS — M1990 Unspecified osteoarthritis, unspecified site: Secondary | ICD-10-CM | POA: Diagnosis not present

## 2017-07-10 HISTORY — DX: Pneumonia, unspecified organism: J18.9

## 2017-07-10 HISTORY — PX: EUS: SHX5427

## 2017-07-10 SURGERY — UPPER ENDOSCOPIC ULTRASOUND (EUS) LINEAR
Anesthesia: Monitor Anesthesia Care

## 2017-07-10 MED ORDER — LIDOCAINE 2% (20 MG/ML) 5 ML SYRINGE
INTRAMUSCULAR | Status: DC | PRN
  Administered 2017-07-10: 75 mg via INTRAVENOUS
  Administered 2017-07-10: 25 mg via INTRAVENOUS

## 2017-07-10 MED ORDER — LACTATED RINGERS IV SOLN
INTRAVENOUS | Status: DC
  Administered 2017-07-10: 10:00:00 via INTRAVENOUS

## 2017-07-10 MED ORDER — PROPOFOL 10 MG/ML IV BOLUS
INTRAVENOUS | Status: DC | PRN
  Administered 2017-07-10 (×3): 20 mg via INTRAVENOUS
  Administered 2017-07-10: 30 mg via INTRAVENOUS
  Administered 2017-07-10: 20 mg via INTRAVENOUS

## 2017-07-10 MED ORDER — PROPOFOL 10 MG/ML IV BOLUS
INTRAVENOUS | Status: AC
  Filled 2017-07-10: qty 20

## 2017-07-10 MED ORDER — CIPROFLOXACIN IN D5W 400 MG/200ML IV SOLN
INTRAVENOUS | Status: AC
  Filled 2017-07-10: qty 200

## 2017-07-10 MED ORDER — CIPROFLOXACIN IN D5W 400 MG/200ML IV SOLN
INTRAVENOUS | Status: DC | PRN
  Administered 2017-07-10: 400 mg via INTRAVENOUS

## 2017-07-10 MED ORDER — PROPOFOL 500 MG/50ML IV EMUL
INTRAVENOUS | Status: DC | PRN
  Administered 2017-07-10: 100 ug/kg/min via INTRAVENOUS

## 2017-07-10 MED ORDER — CIPROFLOXACIN HCL 500 MG PO TABS: 500.0000 mg | ORAL_TABLET | Freq: Two times a day (BID) | ORAL | 0 refills | Status: DC

## 2017-07-10 MED ORDER — SODIUM CHLORIDE 0.9 % IV SOLN: INTRAVENOUS | Status: DC

## 2017-07-10 MED ORDER — PROPOFOL 10 MG/ML IV BOLUS
INTRAVENOUS | Status: AC
  Filled 2017-07-10: qty 40

## 2017-07-10 NOTE — Progress Notes (Addendum)
Faxed referral to Dr. Hampton Abbot re evaluate for cholecystectomy.  Thank you Dr Ardis Hughs for the update.   Teja Judice please refer patient back to Dr Hampton Abbot to re evaluate for cholecystectomy .   Silvio Sausedo Please have the patient see me at the office once the results are back to discuss the next steps for the mass at the GE junction .   Regards   Kiran   Previous Messages    ----- Message -----  From: [name], MD  Sent: 07/10/2017 11:56 AM  To: Jonathon Bellows, MD   Dr. Vicente Males   Just completed EUS. i'll let you know final cytology. Thanks   DJ    - The 6cm isoechoic lesion at the GE junction appears to communicate with the wall of the esophagus. FNA performed, yielding greenish gelatinous material that was sent for cytology review. 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 Await final cytology. Given that the lesion appears to be causing mass effect it should be considered for resection even though it is almost certainly benign.  - His acute illness last month was probably biliary pancreatitis and I recommend elective cholecystectomy.

## 2017-07-10 NOTE — Interval H&P Note (Signed)
History and Physical Interval Note:  07/10/2017 9:45 AM  Rodney Hall.  has presented today for surgery, with the diagnosis of mass in abdomen  The various methods of treatment have been discussed with the patient and family. After consideration of risks, benefits and other options for treatment, the patient has consented to  Procedure(s): UPPER ENDOSCOPIC ULTRASOUND (EUS) LINEAR (N/A) as a surgical intervention .  The patient's history has been reviewed, patient examined, no change in status, stable for surgery.  I have reviewed the patient's chart and labs.  Questions were answered to the patient's satisfaction.     [name]

## 2017-07-10 NOTE — Op Note (Signed)
[facility] Patient Name: Rodney Hall Procedure Date: 07/10/2017 MRN: [id number] Attending MD: [name] , MD Date of Birth: April 08, 1945 CSN: [id number] Age: 72 Admit Type: Outpatient Procedure:                Upper EUS Indications:              Recent mild acute pancreatitis, elevated liver                            tests. Imaging shows paraesopahgeal mass. Providers:                [name], MD, Cleda Daub, RN, Charolette Child, Technician, Heide Scales, CRNA Referring MD:             Jonathon Bellows, MD Medicines:                Monitored Anesthesia Care, Cipro 409 mg IV Complications:            No immediate complications. Estimated blood loss:                            None. Estimated Blood Loss:     Estimated blood loss: none. Procedure:                Pre-Anesthesia Assessment:                           - Prior to the procedure, a History and Physical                            was performed, and patient medications and                            allergies were reviewed. The patient's tolerance of                            previous anesthesia was also reviewed. The risks                            and benefits of the procedure and the sedation                            options and risks were discussed with the patient.                            All questions were answered, and informed consent                            was obtained. Prior Anticoagulants: The patient has                            taken no previous anticoagulant or antiplatelet  agents. ASA Grade Assessment: II - A patient with                            mild systemic disease. After reviewing the risks                            and benefits, the patient was deemed in                            satisfactory condition to undergo the procedure.                           After obtaining informed consent, the endoscope was                             passed under direct vision. Throughout the                            procedure, the patient's blood pressure, pulse, and                            oxygen saturations were monitored continuously. The                            [id number] ([id number]) scope was introduced through                            the mouth, and advanced to the second part of                            duodenum. The upper EUS was accomplished without                            difficulty. The patient tolerated the procedure                            well. Scope In: Scope Out: Findings:      ENDOSCOPIC FINDING: :      The examined esophagus was endoscopically normal.      The entire examined stomach was endoscopically normal.      The examined duodenum was endoscopically normal.      ENDOSONOGRAPHIC FINDING: :      1. A round intramural (subepithelial) lesion was found at the level of       the GE junction. The lesion was isoechoic and measured 6cm. The       endosonographic borders were well-defined and it appeared to communicate       with the deep mucosa, submucosa layer of the distal esophagus. Fine       needle aspiration for cytology was performed. Color Doppler imaging was       utilized prior to needle puncture to confirm a lack of significant       vascular structures within the needle path. Three passes were made with       the 22 gauge needle using a transesophageal approach. This yielded  greenish, gelatinous material that was sent for cytology revierw.      2. CBD was 5.37mm across, contained no obvious stones or debris.      3. Pancreatic parenchyma was normal throughout the gland.      4. Main pancreatic duct was normal, non-dilated.      5. Limited views of the gallbladder were all normal. Impression:               - The 6cm isoechoic lesion at the GE junction                            appears to communicate with the wall of the                            esophagus. FNA  performed, yielding greenish                            gelatinous material that was sent for cytology                            review. 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 Await final cytology. Given that                            the lesion appears to be causing mass effect it                            should be considered for resection even though it                            is almost certainly benign.                           - His acute illness last month was probably biliary                            pancreatitis and I recommend elective                            cholecystectomy. Moderate Sedation:      N/A- Per Anesthesia Care Recommendation:           - Discharge patient to home (ambulatory).                           - He should complete three days of twice daily                            cipro. Procedure Code(s):        --- Professional ---                           [phone], Esophagogastroduodenoscopy, flexible,  transoral; with transendoscopic ultrasound-guided                            intramural or transmural fine needle                            aspiration/biopsy(s), (includes endoscopic                            ultrasound examination limited to the esophagus,                            stomach or duodenum, and adjacent structures) Diagnosis Code(s):        --- Professional ---                           K22.8, Other specified diseases of esophagus                           R93.3, Abnormal findings on diagnostic imaging of                            other parts of digestive tract CPT copyright 2017 [employer]. All rights reserved. The codes documented in this report are preliminary and upon coder review may  be revised to meet current compliance requirements. [name], MD 07/10/2017 11:51:47 AM This report has been signed electronically. Number of Addenda: 0

## 2017-07-10 NOTE — Transfer of Care (Signed)
Immediate Anesthesia Transfer of Care Note  Patient: Rodney Hall.  Procedure(s) Performed: Procedure(s): UPPER ENDOSCOPIC ULTRASOUND (EUS) LINEAR (N/A)  Patient Location: PACU  Anesthesia Type:MAC  Level of Consciousness: Patient easily awoken, sedated, comfortable, cooperative, following commands, responds to stimulation.   Airway & Oxygen Therapy: Patient spontaneously breathing, ventilating well, oxygen via simple oxygen mask.  Post-op Assessment: Report given to PACU RN, vital signs reviewed and stable, moving all extremities.   Post vital signs: Reviewed and stable.  Complications: No apparent anesthesia complications Last Vitals:  Vitals Value Taken Time  BP 100/58 07/10/2017 11:35 AM  Temp    Pulse 81 07/10/2017 11:36 AM  Resp 19 07/10/2017 11:36 AM  SpO2 100 % 07/10/2017 11:36 AM  Vitals shown include unvalidated device data.  Last Pain:  Vitals:   07/10/17 0923  TempSrc: Oral  PainSc: 0-No pain         Complications: No apparent anesthesia complications

## 2017-07-10 NOTE — Anesthesia Postprocedure Evaluation (Signed)
Anesthesia Post Note  Patient: Rodney Hall.  Procedure(s) Performed: UPPER ENDOSCOPIC ULTRASOUND (EUS) LINEAR (N/A )     Patient location during evaluation: Endoscopy Anesthesia Type: MAC Level of consciousness: awake Pain management: pain level controlled Respiratory status: spontaneous breathing Cardiovascular status: stable Anesthetic complications: no    Last Vitals:  Vitals:   07/10/17 1135 07/10/17 1140  BP: (!) 100/58 128/76  Pulse: 82 82  Resp: 16 20  Temp:    SpO2: 100% 95%    Last Pain:  Vitals:   07/10/17 1135  TempSrc:   PainSc: 0-No pain                 Montez Cuda

## 2017-07-10 NOTE — Anesthesia Procedure Notes (Signed)
Procedure Name: MAC Date/Time: 07/10/2017 11:00 AM Performed by: Lollie Sails, CRNA Pre-anesthesia Checklist: Patient identified, Emergency Drugs available, Suction available and Patient being monitored Oxygen Delivery Method: Nasal cannula

## 2017-07-10 NOTE — Anesthesia Preprocedure Evaluation (Signed)
Anesthesia Evaluation  Patient identified by MRN, date of birth, ID band Patient awake    Reviewed: Allergy & Precautions, NPO status , Patient's Chart, lab work & pertinent test results  Airway Mallampati: II  TM Distance: >3 FB     Dental   Pulmonary pneumonia,    breath sounds clear to auscultation       Cardiovascular negative cardio ROS   Rhythm:Regular Rate:Normal     Neuro/Psych    GI/Hepatic negative GI ROS, Neg liver ROS,   Endo/Other  negative endocrine ROS  Renal/GU negative Renal ROS     Musculoskeletal  (+) Arthritis ,   Abdominal   Peds  Hematology   Anesthesia Other Findings   Reproductive/Obstetrics                             Anesthesia Physical Anesthesia Plan  ASA: III  Anesthesia Plan: MAC   Post-op Pain Management:    Induction: Intravenous  PONV Risk Score and Plan: Treatment may vary due to age or medical condition  Airway Management Planned: Nasal Cannula and Simple Face Mask  Additional Equipment:   Intra-op Plan:   Post-operative Plan:   Informed Consent: I have reviewed the patients History and Physical, chart, labs and discussed the procedure including the risks, benefits and alternatives for the proposed anesthesia with the patient or authorized representative who has indicated his/her understanding and acceptance.   Dental advisory given  Plan Discussed with: CRNA and Anesthesiologist  Anesthesia Plan Comments:         Anesthesia Quick Evaluation

## 2017-07-11 ENCOUNTER — Encounter (HOSPITAL_COMMUNITY): Payer: Self-pay | Admitting: Gastroenterology

## 2017-07-11 ENCOUNTER — Telehealth: Payer: Self-pay | Admitting: Gastroenterology

## 2017-07-11 NOTE — Telephone Encounter (Signed)
Referral sent to [facility] due to patient request.   Originally sent request to Dr. Hampton Abbot. Pt states he's unavailable until the end of July.

## 2017-07-11 NOTE — Telephone Encounter (Signed)
Pt is calling he was being referred to  Dr. Marvel Plan pt states that Dr is not available til end July and he would like to be referred to Another Doctor to remove his Gallblader  And cyst removal. Please send referral to another [facility]

## 2017-07-16 ENCOUNTER — Telehealth: Payer: Self-pay

## 2017-07-16 ENCOUNTER — Telehealth: Payer: Self-pay | Admitting: Gastroenterology

## 2017-07-16 NOTE — Telephone Encounter (Signed)
Pt would like to get his biopsy results released on mychart he has a Doctors appointment in the morning and would like to show this report to his physician

## 2017-07-16 NOTE — Telephone Encounter (Signed)
Referral was sent to [facility] surgery for cholecystectomy and possible duplication cyst resection att the [place].   Appt scheduled for 5/16.  Follow-up on 5/17 concerning resection or if Thoracic Surgeon needs a consult.

## 2017-07-17 ENCOUNTER — Inpatient Hospital Stay: Payer: Self-pay | Admitting: Surgery

## 2017-07-17 DIAGNOSIS — K861 Other chronic pancreatitis: Secondary | ICD-10-CM | POA: Diagnosis not present

## 2017-07-17 DIAGNOSIS — Q398 Other congenital malformations of esophagus: Secondary | ICD-10-CM | POA: Diagnosis not present

## 2017-07-18 NOTE — Telephone Encounter (Signed)
Mr. Masten called this morning requesting Cipro due to continued symptoms of gallbladder infection.   Per Dr. Allen Norris, no Rx given. Directed patient to the Surgeon's office for follow-up.  I contacted the Triage Nurse in [place] at [facility]. She states that they typically do not prescribe pain meds for the gallbladder issues. Not wanting to mask any pain that may be an indicator for possible emergency surgery. She suggested diet changes and liquid diets with low fat foods.  This information was relayed to Mr. Springborn. Also directed him to contact the Triage nurse directly for additional information and clarity on the issue.  He agreed and was issued the phone number.

## 2017-07-21 NOTE — Patient Instructions (Addendum)
Rodney Hall.  07/21/2017   Your procedure is scheduled on: 07/25/2017   Report to [facility] Main  Entrance              Report to admitting at   [place] AM    Call this number if you have problems the morning of surgery [phone]   Remember: Do not eat food  :After Midnight.  NO SOLID FOOD AFTER MIDNIGHT THE NIGHT PRIOR TO SURGERY. NOTHING BY MOUTH EXCEPT CLEAR LIQUIDS UNTIL 3 HOURS PRIOR TO [place] SURGERY. PLEASE FINISH ENSURE DRINK PER SURGEON ORDER 3 HOURS PRIOR TO SCHEDULED SURGERY TIME WHICH NEEDS TO BE COMPLETED AT ___0430 am_________.   Take these medicines the morning of surgery with A SIP OF WATER: none                                You may not have any metal on your body including hair pins and              piercings  Do not wear jewelry, , lotions, powders or perfumes, deodorant                           Men may shave face and neck.   Do not bring valuables to the hospital. [place].  Contacts, dentures or bridgework may not be worn into surgery. .     Patients discharged the day of surgery will not be allowed to drive home.  Name and phone number of your driver:               Please read over the following fact sheets you were given: _____________________________________________________________________             [facility] - Preparing for Surgery Before surgery, you can play an important role.  Because skin is not sterile, your skin needs to be as free of germs as possible.  You can reduce the number of germs on your skin by washing with CHG (chlorahexidine gluconate) soap before surgery.  CHG is an antiseptic cleaner which kills germs and bonds with the skin to continue killing germs even after washing. Please DO NOT use if you have an allergy to CHG or antibacterial soaps.  If your skin becomes reddened/irritated stop using the CHG and inform your nurse when you arrive at Short  Stay. Do not shave (including legs and underarms) for at least 48 hours prior to the first CHG shower.  You may shave your face/neck. Please follow these instructions carefully:  1.  Shower with CHG Soap the night before surgery and the  morning of Surgery.  2.  If you choose to wash your hair, wash your hair first as usual with your  normal  shampoo.  3.  After you shampoo, rinse your hair and body thoroughly to remove the  shampoo.                           4.  Use CHG as you would any other liquid soap.  You can apply chg directly  to the skin and wash  Gently with a scrungie or clean washcloth.  5.  Apply the CHG Soap to your body ONLY FROM THE NECK DOWN.   Do not use on face/ open                           Wound or open sores. Avoid contact with eyes, ears mouth and genitals (private parts).                       Wash face,  Genitals (private parts) with your normal soap.             6.  Wash thoroughly, paying special attention to the area where your surgery  will be performed.  7.  Thoroughly rinse your body with warm water from the neck down.  8.  DO NOT shower/wash with your normal soap after using and rinsing off  the CHG Soap.                9.  Pat yourself dry with a clean towel.            10.  Wear clean pajamas.            11.  Place clean sheets on your bed the night of your first shower and do not  sleep with pets. Day of Surgery : Do not apply any lotions/deodorants the morning of surgery.  Please wear clean clothes to the hospital/surgery center.  FAILURE TO FOLLOW THESE INSTRUCTIONS MAY RESULT IN THE CANCELLATION OF YOUR SURGERY PATIENT SIGNATURE_________________________________  NURSE SIGNATURE__________________________________  ________________________________________________________________________

## 2017-07-22 ENCOUNTER — Encounter (HOSPITAL_COMMUNITY): Payer: Self-pay

## 2017-07-22 ENCOUNTER — Other Ambulatory Visit: Payer: Self-pay

## 2017-07-22 ENCOUNTER — Encounter (HOSPITAL_COMMUNITY)
Admission: RE | Admit: 2017-07-22 | Discharge: 2017-07-22 | Disposition: A | Discharge status: Home / Self Care | Payer: PPO | Source: Ambulatory Visit | Attending: Surgery | Admitting: Surgery

## 2017-07-22 DIAGNOSIS — M1990 Unspecified osteoarthritis, unspecified site: Secondary | ICD-10-CM | POA: Diagnosis not present

## 2017-07-22 DIAGNOSIS — F419 Anxiety disorder, unspecified: Secondary | ICD-10-CM | POA: Diagnosis not present

## 2017-07-22 DIAGNOSIS — Q398 Other congenital malformations of esophagus: Secondary | ICD-10-CM | POA: Diagnosis not present

## 2017-07-22 DIAGNOSIS — K811 Chronic cholecystitis: Secondary | ICD-10-CM | POA: Diagnosis present

## 2017-07-22 DIAGNOSIS — Z880 Allergy status to penicillin: Secondary | ICD-10-CM | POA: Diagnosis not present

## 2017-07-22 DIAGNOSIS — Z79899 Other long term (current) drug therapy: Secondary | ICD-10-CM | POA: Diagnosis not present

## 2017-07-22 LAB — CBC
HCT: 41.8 % (ref 39.0–52.0)
Hemoglobin: 13.9 g/dL (ref 13.0–17.0)
MCH: 30.6 pg (ref 26.0–34.0)
MCHC: 33.3 g/dL (ref 30.0–36.0)
MCV: 92.1 fL (ref 78.0–100.0)
Platelets: 345 10*3/uL (ref 150–400)
RBC: 4.54 MIL/uL (ref 4.22–5.81)
RDW: 12.6 % (ref 11.5–15.5)
WBC: 8.4 10*3/uL (ref 4.0–10.5)

## 2017-07-22 NOTE — Progress Notes (Signed)
ekg epic 06-03-17  cmp 07-02-17 epic

## 2017-07-24 ENCOUNTER — Encounter (HOSPITAL_COMMUNITY): Payer: Self-pay | Admitting: Anesthesiology

## 2017-07-24 NOTE — Anesthesia Preprocedure Evaluation (Addendum)
Anesthesia Evaluation  Patient identified by MRN, date of birth, ID band Patient awake    Reviewed: Allergy & Precautions, NPO status , Patient's Chart, lab work & pertinent test results  Airway Mallampati: III  TM Distance: >3 FB Neck ROM: Full    Dental  (+) Teeth Intact, Dental Advisory Given   Pulmonary    breath sounds clear to auscultation       Cardiovascular negative cardio ROS   Rhythm:Regular Rate:Normal     Neuro/Psych PSYCHIATRIC DISORDERS Anxiety negative neurological ROS     GI/Hepatic negative GI ROS, Neg liver ROS,   Endo/Other  negative endocrine ROS  Renal/GU negative Renal ROS     Musculoskeletal  (+) Arthritis , Osteoarthritis,    Abdominal Normal abdominal exam  (+)   Peds  Hematology negative hematology ROS (+)   Anesthesia Other Findings   Reproductive/Obstetrics                            Anesthesia Physical Anesthesia Plan  ASA: II  Anesthesia Plan: General   Post-op Pain Management:    Induction: Intravenous  PONV Risk Score and Plan: 3 and Ondansetron, Dexamethasone and Midazolam  Airway Management Planned: Oral ETT  Additional Equipment: None  Intra-op Plan:   Post-operative Plan: Extubation in OR  Informed Consent: I have reviewed the patients History and Physical, chart, labs and discussed the procedure including the risks, benefits and alternatives for the proposed anesthesia with the patient or authorized representative who has indicated his/her understanding and acceptance.   Dental advisory given  Plan Discussed with: CRNA  Anesthesia Plan Comments:         Anesthesia Quick Evaluation

## 2017-07-25 ENCOUNTER — Encounter (HOSPITAL_COMMUNITY)
Admission: RE | Disposition: A | Discharge status: Home / Self Care | Payer: Self-pay | Source: Ambulatory Visit | Attending: Surgery

## 2017-07-25 ENCOUNTER — Ambulatory Visit (HOSPITAL_COMMUNITY)
Admission: RE | Admit: 2017-07-25 | Discharge: 2017-07-25 | Disposition: A | Discharge status: Home / Self Care | Payer: PPO | Source: Ambulatory Visit | Attending: Surgery | Admitting: Surgery

## 2017-07-25 ENCOUNTER — Ambulatory Visit (HOSPITAL_COMMUNITY): Payer: PPO | Admitting: Anesthesiology

## 2017-07-25 ENCOUNTER — Ambulatory Visit (HOSPITAL_COMMUNITY): Payer: PPO

## 2017-07-25 ENCOUNTER — Inpatient Hospital Stay: Payer: Self-pay | Admitting: Surgery

## 2017-07-25 ENCOUNTER — Encounter (HOSPITAL_COMMUNITY): Payer: Self-pay | Admitting: *Deleted

## 2017-07-25 DIAGNOSIS — M1990 Unspecified osteoarthritis, unspecified site: Secondary | ICD-10-CM | POA: Diagnosis not present

## 2017-07-25 DIAGNOSIS — K851 Biliary acute pancreatitis without necrosis or infection: Secondary | ICD-10-CM | POA: Diagnosis not present

## 2017-07-25 DIAGNOSIS — K862 Cyst of pancreas: Secondary | ICD-10-CM | POA: Diagnosis not present

## 2017-07-25 DIAGNOSIS — M7540 Impingement syndrome of unspecified shoulder: Secondary | ICD-10-CM | POA: Diagnosis not present

## 2017-07-25 DIAGNOSIS — Q398 Other congenital malformations of esophagus: Secondary | ICD-10-CM | POA: Insufficient documentation

## 2017-07-25 DIAGNOSIS — K811 Chronic cholecystitis: Secondary | ICD-10-CM | POA: Insufficient documentation

## 2017-07-25 DIAGNOSIS — Z79899 Other long term (current) drug therapy: Secondary | ICD-10-CM | POA: Insufficient documentation

## 2017-07-25 DIAGNOSIS — F419 Anxiety disorder, unspecified: Secondary | ICD-10-CM | POA: Insufficient documentation

## 2017-07-25 DIAGNOSIS — Z880 Allergy status to penicillin: Secondary | ICD-10-CM | POA: Diagnosis not present

## 2017-07-25 DIAGNOSIS — K8020 Calculus of gallbladder without cholecystitis without obstruction: Secondary | ICD-10-CM

## 2017-07-25 DIAGNOSIS — K839 Disease of biliary tract, unspecified: Secondary | ICD-10-CM | POA: Diagnosis not present

## 2017-07-25 DIAGNOSIS — K8042 Calculus of bile duct with acute cholecystitis without obstruction: Secondary | ICD-10-CM | POA: Diagnosis not present

## 2017-07-25 HISTORY — PX: CHOLECYSTECTOMY: SHX55

## 2017-07-25 SURGERY — LAPAROSCOPIC CHOLECYSTECTOMY WITH INTRAOPERATIVE CHOLANGIOGRAM
Anesthesia: General | Site: Abdomen

## 2017-07-25 MED ORDER — CHLORHEXIDINE GLUCONATE CLOTH 2 % EX PADS: 6.0000 | MEDICATED_PAD | Freq: Once | CUTANEOUS | Status: DC

## 2017-07-25 MED ORDER — HYDROCODONE-ACETAMINOPHEN 5-325 MG PO TABS: 1.0000 | ORAL_TABLET | Freq: Four times a day (QID) | ORAL | 0 refills | Status: DC | PRN

## 2017-07-25 MED ORDER — BUPIVACAINE-EPINEPHRINE (PF) 0.25% -1:200000 IJ SOLN
INTRAMUSCULAR | Status: AC
  Filled 2017-07-25: qty 30

## 2017-07-25 MED ORDER — SODIUM CHLORIDE 0.9 % IV SOLN
INTRAVENOUS | Status: AC
  Filled 2017-07-25: qty 2

## 2017-07-25 MED ORDER — ONDANSETRON HCL 4 MG/2ML IJ SOLN
INTRAMUSCULAR | Status: AC
  Filled 2017-07-25: qty 2

## 2017-07-25 MED ORDER — CEFOTETAN DISODIUM-DEXTROSE 2-2.08 GM-%(50ML) IV SOLR
2.0000 g | INTRAVENOUS | Status: AC
  Administered 2017-07-25: 2 g via INTRAVENOUS

## 2017-07-25 MED ORDER — PROMETHAZINE HCL 25 MG/ML IJ SOLN: 6.2500 mg | INTRAMUSCULAR | Status: DC | PRN

## 2017-07-25 MED ORDER — ROCURONIUM BROMIDE 10 MG/ML (PF) SYRINGE
PREFILLED_SYRINGE | INTRAVENOUS | Status: AC
  Filled 2017-07-25: qty 5

## 2017-07-25 MED ORDER — MEPERIDINE HCL 50 MG/ML IJ SOLN: 6.2500 mg | INTRAMUSCULAR | Status: DC | PRN

## 2017-07-25 MED ORDER — OXYCODONE HCL 5 MG PO TABS: 5.0000 mg | ORAL_TABLET | Freq: Once | ORAL | Status: DC | PRN

## 2017-07-25 MED ORDER — ROCURONIUM BROMIDE 10 MG/ML (PF) SYRINGE
PREFILLED_SYRINGE | INTRAVENOUS | Status: DC | PRN
  Administered 2017-07-25: 10 mg via INTRAVENOUS
  Administered 2017-07-25: 50 mg via INTRAVENOUS

## 2017-07-25 MED ORDER — GABAPENTIN 300 MG PO CAPS
300.0000 mg | ORAL_CAPSULE | ORAL | Status: AC
  Administered 2017-07-25: 300 mg via ORAL
  Filled 2017-07-25: qty 1

## 2017-07-25 MED ORDER — ONDANSETRON HCL 4 MG/2ML IJ SOLN
INTRAMUSCULAR | Status: DC | PRN
  Administered 2017-07-25: 4 mg via INTRAVENOUS

## 2017-07-25 MED ORDER — DEXAMETHASONE SODIUM PHOSPHATE 10 MG/ML IJ SOLN
INTRAMUSCULAR | Status: AC
  Filled 2017-07-25: qty 1

## 2017-07-25 MED ORDER — BUPIVACAINE LIPOSOME 1.3 % IJ SUSP
20.0000 mL | Freq: Once | INTRAMUSCULAR | Status: AC
  Administered 2017-07-25: 20 mL
  Filled 2017-07-25: qty 20

## 2017-07-25 MED ORDER — OXYCODONE HCL 5 MG/5ML PO SOLN: 5.0000 mg | Freq: Once | ORAL | Status: DC | PRN

## 2017-07-25 MED ORDER — EPHEDRINE SULFATE-NACL 50-0.9 MG/10ML-% IV SOSY
PREFILLED_SYRINGE | INTRAVENOUS | Status: DC | PRN
  Administered 2017-07-25: 10 mg via INTRAVENOUS

## 2017-07-25 MED ORDER — PHENYLEPHRINE HCL 10 MG/ML IJ SOLN
INTRAMUSCULAR | Status: AC
  Filled 2017-07-25: qty 1

## 2017-07-25 MED ORDER — PHENYLEPHRINE 40 MCG/ML (10ML) SYRINGE FOR IV PUSH (FOR BLOOD PRESSURE SUPPORT)
PREFILLED_SYRINGE | INTRAVENOUS | Status: DC | PRN
  Administered 2017-07-25: 160 ug via INTRAVENOUS
  Administered 2017-07-25: 80 ug via INTRAVENOUS
  Administered 2017-07-25: 120 ug via INTRAVENOUS
  Administered 2017-07-25: 40 ug via INTRAVENOUS

## 2017-07-25 MED ORDER — LIDOCAINE 2% (20 MG/ML) 5 ML SYRINGE
INTRAMUSCULAR | Status: AC
  Filled 2017-07-25: qty 5

## 2017-07-25 MED ORDER — PHENYLEPHRINE HCL 10 MG/ML IJ SOLN
INTRAVENOUS | Status: DC | PRN
  Administered 2017-07-25: 100 ug/min via INTRAVENOUS

## 2017-07-25 MED ORDER — LACTATED RINGERS IV SOLN
INTRAVENOUS | Status: DC
  Administered 2017-07-25: 10:00:00 via INTRAVENOUS

## 2017-07-25 MED ORDER — ACETAMINOPHEN 500 MG PO TABS
1000.0000 mg | ORAL_TABLET | ORAL | Status: AC
  Administered 2017-07-25: 1000 mg via ORAL
  Filled 2017-07-25: qty 2

## 2017-07-25 MED ORDER — DEXAMETHASONE SODIUM PHOSPHATE 10 MG/ML IJ SOLN
INTRAMUSCULAR | Status: DC | PRN
  Administered 2017-07-25: 10 mg via INTRAVENOUS

## 2017-07-25 MED ORDER — SUGAMMADEX SODIUM 200 MG/2ML IV SOLN
INTRAVENOUS | Status: AC
  Filled 2017-07-25: qty 2

## 2017-07-25 MED ORDER — PHENYLEPHRINE 40 MCG/ML (10ML) SYRINGE FOR IV PUSH (FOR BLOOD PRESSURE SUPPORT)
PREFILLED_SYRINGE | INTRAVENOUS | Status: AC
  Filled 2017-07-25: qty 10

## 2017-07-25 MED ORDER — FENTANYL CITRATE (PF) 100 MCG/2ML IJ SOLN
INTRAMUSCULAR | Status: AC
  Filled 2017-07-25: qty 2

## 2017-07-25 MED ORDER — LACTATED RINGERS IV SOLN
INTRAVENOUS | Status: DC
  Administered 2017-07-25 (×2): via INTRAVENOUS

## 2017-07-25 MED ORDER — SUCCINYLCHOLINE CHLORIDE 200 MG/10ML IV SOSY
PREFILLED_SYRINGE | INTRAVENOUS | Status: DC | PRN
  Administered 2017-07-25: 120 mg via INTRAVENOUS

## 2017-07-25 MED ORDER — IOPAMIDOL (ISOVUE-300) INJECTION 61%
INTRAVENOUS | Status: AC
  Filled 2017-07-25: qty 50

## 2017-07-25 MED ORDER — HEPARIN SODIUM (PORCINE) 5000 UNIT/ML IJ SOLN
5000.0000 [IU] | Freq: Once | INTRAMUSCULAR | Status: AC
  Administered 2017-07-25: 5000 [IU] via SUBCUTANEOUS
  Filled 2017-07-25: qty 1

## 2017-07-25 MED ORDER — FENTANYL CITRATE (PF) 100 MCG/2ML IJ SOLN
INTRAMUSCULAR | Status: DC | PRN
  Administered 2017-07-25 (×2): 50 ug via INTRAVENOUS

## 2017-07-25 MED ORDER — SUGAMMADEX SODIUM 200 MG/2ML IV SOLN
INTRAVENOUS | Status: DC | PRN
  Administered 2017-07-25: 200 mg via INTRAVENOUS

## 2017-07-25 MED ORDER — CELECOXIB 200 MG PO CAPS
200.0000 mg | ORAL_CAPSULE | ORAL | Status: AC
  Administered 2017-07-25: 200 mg via ORAL
  Filled 2017-07-25: qty 1

## 2017-07-25 MED ORDER — PROPOFOL 10 MG/ML IV BOLUS
INTRAVENOUS | Status: AC
  Filled 2017-07-25: qty 20

## 2017-07-25 MED ORDER — LIDOCAINE 2% (20 MG/ML) 5 ML SYRINGE
INTRAMUSCULAR | Status: DC | PRN
  Administered 2017-07-25: 100 mg via INTRAVENOUS

## 2017-07-25 MED ORDER — PROPOFOL 10 MG/ML IV BOLUS
INTRAVENOUS | Status: DC | PRN
  Administered 2017-07-25: 200 mg via INTRAVENOUS

## 2017-07-25 MED ORDER — HYDROMORPHONE HCL 1 MG/ML IJ SOLN: 0.2500 mg | INTRAMUSCULAR | Status: DC | PRN

## 2017-07-25 MED ORDER — IOPAMIDOL (ISOVUE-300) INJECTION 61%
INTRAVENOUS | Status: DC | PRN
  Administered 2017-07-25: 6 mL via INTRAVENOUS

## 2017-07-25 MED ORDER — RINGERS IRRIGATION IR SOLN
Status: DC | PRN
  Administered 2017-07-25: 1 "application "

## 2017-07-25 SURGICAL SUPPLY — 37 items
ADH SKN CLS APL DERMABOND .7 (GAUZE/BANDAGES/DRESSINGS) ×1
APL SKNCLS STERI-STRIP NONHPOA (GAUZE/BANDAGES/DRESSINGS)
APPLICATOR COTTON TIP 6IN STRL (MISCELLANEOUS)
APPLIER CLIP ROT 10 11.4 M/L (STAPLE) ×2
APR CLP MED LRG 11.4X10 (STAPLE) ×1
BAG SPEC RTRVL 10 TROC 200 (ENDOMECHANICALS) ×1
BENZOIN TINCTURE PRP APPL 2/3 (GAUZE/BANDAGES/DRESSINGS)
CABLE HIGH FREQUENCY MONO STRZ (ELECTRODE) ×2
CATH REDDICK CHOLANGI 4FR 50CM (CATHETERS) ×2
COVER MAYO STAND STRL (DRAPES) ×2
COVER SURGICAL LIGHT HANDLE (MISCELLANEOUS) ×2
DECANTER SPIKE VIAL GLASS SM (MISCELLANEOUS) ×2
DERMABOND ADVANCED (GAUZE/BANDAGES/DRESSINGS) ×1
DERMABOND ADVANCED .7 DNX12 (GAUZE/BANDAGES/DRESSINGS) ×1
DRAPE C-ARM 42X120 X-RAY (DRAPES) ×2
ELECT PENCIL ROCKER SW 15FT (MISCELLANEOUS)
ELECT REM PT RETURN 15FT ADLT (MISCELLANEOUS) ×2
GLOVE BIOGEL M 8.0 STRL (GLOVE) ×2
GOWN STRL REUS W/TWL XL LVL3 (GOWN DISPOSABLE) ×6
HEMOSTAT SURGICEL 4X8 (HEMOSTASIS)
IV CATH 14GX2 1/4 (CATHETERS) ×2
KIT BASIN OR (CUSTOM PROCEDURE TRAY) ×2
L-HOOK LAP DISP 36CM (ELECTROSURGICAL)
POUCH RETRIEVAL ECOSAC 10 (ENDOMECHANICALS) ×1
POUCH RETRIEVAL ECOSAC 10MM (ENDOMECHANICALS) ×1
SCISSORS LAP 5X45 EPIX DISP (ENDOMECHANICALS) ×2
SET IRRIG TUBING LAPAROSCOPIC (IRRIGATION / IRRIGATOR) ×2
SLEEVE XCEL OPT CAN 5 100 (ENDOMECHANICALS) ×2
STRIP CLOSURE SKIN 1/2X4 (GAUZE/BANDAGES/DRESSINGS)
SUT MNCRL AB 4-0 PS2 18 (SUTURE) ×2
SYR 20CC LL (SYRINGE) ×2
TOWEL OR 17X26 10 PK STRL BLUE (TOWEL DISPOSABLE) ×2
TRAY LAPAROSCOPIC (CUSTOM PROCEDURE TRAY) ×2
TROCAR BLADELESS OPT 5 100 (ENDOMECHANICALS) ×2
TROCAR XCEL BLUNT TIP 100MML (ENDOMECHANICALS)
TROCAR XCEL NON-BLD 11X100MML (ENDOMECHANICALS) ×2
TUBING INSUF HEATED (TUBING) ×2

## 2017-07-25 NOTE — Op Note (Signed)
Rodney Hall.  Primary Care Physician:  Carmon Ginsberg, [place]    07/25/2017  9:31 AM  Procedure: Laparoscopic Cholecystectomy with intraoperative cholangiogram  Surgeon: Catalina Antigua B. Hassell Done, MD, FACS Asst:  Leighton Ruff, MD,FACS  Anes:  General  Drains:  None  Findings: Marked chronic cholecystitis; normal IOC (no common channel with CBD and pancreatic duct);  Esophageal duplication cyst-see below    Description of Procedure: The patient was taken to OR 1 on Friday of Memorial Day weekend and given general anesthesia.  The patient was prepped with Technicare and draped sterilely. A time out was performed.  Access to the abdomen was achieved with a 5 mm Optiview through the right upper quadrant.  Port placement included three 5 mm trocars and one 12 mm trocar in the upper midline.  The esophageal duplication cyst was visualized and photos taken.  The gallbladder was completely walled off with omentum indicating chronic and severe cholecystitis.  These adhesions were taken down sharply.    The gallbladder was visualized and the fundus was grasped and the gallbladder was elevated. Traction on the infundibulum allowed for successful demonstration of the critical view. Inflammatory changes were chronic and much dissection was done to achieve a view of the cystic duct.  The cystic duct was identified and clipped up on the gallbladder and an incision was made in the cystic duct and the Reddick catheter was inserted after milking the cystic duct of any debris. A dynamic cholangiogram was performed which demonstrated intra and extrahepatic bile ducts and no retro filling of the pancreatic duct; no stones in the CBD.    The cystic duct was then triple clipped and divided, the cystic artery was double clipped and divided and then the gallbladder was removed from the gallbladder bed. Removal of the gallbladder from the gallbladder bed was performed with the hook cautery.  The gallbladder was then placed  in a bag and brought out through one of the trocar sites. The gallbladder bed was inspected and no bleeding or bile leaks were seen.   Laparoscopic visualization was used when closing fascial defects for trocar sites.   Incisions were injected with a tap block on the right with Exparel and closed with 4-0 monocryl and Dermabond on the skin.  Sponge and needle count were correct.    The patient was taken to the recovery room in satisfactory condition. Matt B. Hassell Done, MD, FACS

## 2017-07-25 NOTE — Anesthesia Procedure Notes (Signed)
Procedure Name: Intubation Date/Time: 07/25/2017 7:39 AM Performed by: Lind Covert, CRNA Pre-anesthesia Checklist: Patient identified, Emergency Drugs available, Suction available, Patient being monitored and Timeout performed Patient Re-evaluated:Patient Re-evaluated prior to induction Oxygen Delivery Method: Circle system utilized Preoxygenation: Pre-oxygenation with 100% oxygen Induction Type: IV induction Ventilation: Mask ventilation without difficulty Laryngoscope Size: Mac and 4 Grade View: Grade I Tube type: Oral Tube size: 7.5 mm Number of attempts: 1 Airway Equipment and Method: Stylet Placement Confirmation: ETT inserted through vocal cords under direct vision,  breath sounds checked- equal and bilateral and positive ETCO2 Secured at: 21 cm Tube secured with: Tape Dental Injury: Teeth and Oropharynx as per pre-operative assessment

## 2017-07-25 NOTE — H&P (Signed)
Rodney Hall Location: [place] Office Patient #: [id number] DOB: December 09, 1945 Married / Language: English / Race: White Male   History of Present Illness Rodney Key B. Hassell Done MD; 07/17/2017 10:09 AM) The patient is a 72 year old male who presents for evaluation of gall stones. Rodney Hall was seen in the office with his wife. I reviewed Dr. Ardis Hughs ultrasound notes. The cystic structure yielded greenish gelatinous material. We'll need to look at the contents of this by cytology were not malignant. The patient however is had at least 2 bouts of of significant midepigastric pain radiating to the back 1 about 25 years ago at which time he swore off alcohol rarely didn't drink that much began with and then more recently. Per Dr. Ardis Hughs note the idea is that he may well have passed small stones and had biliary pancreatitis. I think that it would make cholecystectomy pretty compelling at this point. I went over this with the patient is wife and I would consider doing an elective cholecystectomy with intraoperative cholangiogram and to look at this cyst upon the stomach C where it is and really determine whether it needs to be disturbed at this time or not. If it is asymptomatic and benign then I would favor observation. If it is truly from the above the esophagogastric junction resection may well give him postop issues with reflux which he has not at this time and does expose him to a little increased risk with resection.  We'll discuss with Dr. Ardis Hughs. However the present we'll plan to move forward with laparoscopic cholecystectomy with intraoperative cholangiogram.  Informed consent was obtained regarding this operation and a booklet was given about it as well.   Past Surgical History Malachi Bonds, CMA; 07/17/2017 9:26 AM) Cataract Surgery  Bilateral. Colon Polyp Removal - Open  Spinal Surgery - Lower Back  Spinal Surgery - Neck  Spinal Surgery Midback  Tonsillectomy    Diagnostic Studies History Malachi Bonds, CMA; 07/17/2017 9:26 AM) Colonoscopy  1-5 years ago  Allergies Malachi Bonds, CMA; 07/17/2017 9:28 AM) Amoxicillin *PENICILLINS*  Rash.  Medication History Malachi Bonds, CMA; 07/17/2017 9:28 AM) Imipramine HCl (50MG  Tablet, Oral) Active. Elmiron (100MG  Capsule, Oral) Active. Ocean Nasal Spray (0.65% Solution, Nasal) Active. Tamsulosin HCl (0.4MG  Capsule, Oral) Active. Medications Reconciled  Social History Malachi Bonds, CMA; 07/17/2017 9:26 AM) Alcohol use  Remotely quit alcohol use. Caffeine use  Carbonated beverages. Tobacco use  Never smoker.  Family History Malachi Bonds, CMA; 07/17/2017 9:26 AM) Alcohol Abuse  Father. Cerebrovascular Accident  Mother. Diabetes Mellitus  Brother, Father. Heart disease in male family member before age 57  Hypertension  Brother.  Other Problems Malachi Bonds, CMA; 07/17/2017 9:26 AM) Back Pain  Bladder Problems  Cholelithiasis  Hemorrhoids  Pancreatitis     Review of Systems (Chemira Jones CMA; 07/17/2017 9:26 AM) General Present- Fatigue. Not Present- Appetite Loss, Chills, Fever, Night Sweats, Weight Gain and Weight Loss. Skin Not Present- Change in Wart/Mole, Dryness, Hives, Jaundice, New Lesions, Non-Healing Wounds, Rash and Ulcer. HEENT Not Present- Earache, Hearing Loss, Hoarseness, Nose Bleed, Oral Ulcers, Ringing in the Ears, Seasonal Allergies, Sinus Pain, Sore Throat, Visual Disturbances, Wears glasses/contact lenses and Yellow Eyes. Respiratory Not Present- Bloody sputum, Chronic Cough, Difficulty Breathing, Snoring and Wheezing. Breast Not Present- Breast Mass, Breast Pain, Nipple Discharge and Skin Changes. Cardiovascular Not Present- Chest Pain, Difficulty Breathing Lying Down, Leg Cramps, Palpitations, Rapid Heart Rate, Shortness of Breath and Swelling of Extremities. Gastrointestinal Present- Hemorrhoids. Not Present- Abdominal Pain,  Bloating, Bloody  Stool, Change in Bowel Habits, Chronic diarrhea, Constipation, Difficulty Swallowing, Excessive gas, Gets full quickly at meals, Indigestion, Nausea, Rectal Pain and Vomiting. Male Genitourinary Present- Frequency. Not Present- Blood in Urine, Change in Urinary Stream, Impotence, Nocturia, Painful Urination, Urgency and Urine Leakage. Musculoskeletal Present- Back Pain. Not Present- Joint Pain, Joint Stiffness, Muscle Pain, Muscle Weakness and Swelling of Extremities. Neurological Not Present- Decreased Memory, Fainting, Headaches, Numbness, Seizures, Tingling, Tremor, Trouble walking and Weakness. Psychiatric Not Present- Anxiety, Bipolar, Change in Sleep Pattern, Depression, Fearful and Frequent crying. Endocrine Not Present- Cold Intolerance, Excessive Hunger, Hair Changes, Heat Intolerance, Hot flashes and New Diabetes. Hematology Not Present- Blood Thinners, Easy Bruising, Excessive bleeding, Gland problems, HIV and Persistent Infections.  Vitals (Chemira Jones CMA; 07/17/2017 9:27 AM) 07/17/2017 9:27 AM Weight: 192.8 lb Height: 71in Body Surface Area: 2.08 m Body Mass Index: 26.89 kg/m  Pulse: 82 (Regular)  BP: 140/82 (Sitting, Left Arm, Standard)       Physical Exam (Estee Yohe B. Hassell Done MD; 07/17/2017 10:11 AM) The physical exam findings are as follows: Note:well-maintained 72 year old white male no acute distress HEENT exam unremarkable. Neck he's had previous cervical fusion work by Dr. Hal Neer back surgery by Dr. Cyndy Freeze. Neck supple with no carotid bruits noted. Chest clear Heart sinus rhythm without murmurs or gallops Abdomen is nontender at this time. Extremity full range of motion no history of DVT Neuro alert and oriented -3. Motor sensory function grossly intact.    Assessment & Plan Rodney Key B. 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 Hassell Done, MD, FACS

## 2017-07-25 NOTE — Transfer of Care (Signed)
Immediate Anesthesia Transfer of Care Note  Patient: Rodney Hall.  Procedure(s) Performed: LAPAROSCOPIC CHOLECYSTECTOMY WITH INTRAOPERATIVE CHOLANGIOGRAM (N/A Abdomen)  Patient Location: PACU  Anesthesia Type:General  Level of Consciousness: sedated  Airway & Oxygen Therapy: Patient Spontanous Breathing and Patient connected to face mask oxygen  Post-op Assessment: Report given to RN and Post -op Vital signs reviewed and stable  Post vital signs: Reviewed and stable  Last Vitals:  Vitals Value Taken Time  BP    Temp    Pulse 82 07/25/2017  9:29 AM  Resp 16 07/25/2017  9:29 AM  SpO2 99 % 07/25/2017  9:29 AM  Vitals shown include unvalidated device data.  Last Pain:  Vitals:   07/25/17 0556  TempSrc:   PainSc: 0-No pain         Complications: No apparent anesthesia complications

## 2017-07-25 NOTE — Discharge Instructions (Signed)
Laparoscopic Cholecystectomy °Laparoscopic cholecystectomy is surgery to remove the gallbladder. The gallbladder is a pear-shaped organ that lies beneath the liver on the right side of the body. The gallbladder stores bile, which is a fluid that helps the body to digest fats. Cholecystectomy is often done for inflammation of the gallbladder (cholecystitis). This condition is usually caused by a buildup of gallstones (cholelithiasis) in the gallbladder. Gallstones can block the flow of bile, which can result in inflammation and pain. In severe cases, emergency surgery may be required. °This procedure is done though small incisions in your abdomen (laparoscopic surgery). A thin scope with a camera (laparoscope) is inserted through one incision. Thin surgical instruments are inserted through the other incisions. In some cases, a laparoscopic procedure may be turned into a type of surgery that is done through a larger incision (open surgery). °Tell a health care provider about: °· Any allergies you have. °· All medicines you are taking, including vitamins, herbs, eye drops, creams, and over-the-counter medicines. °· Any problems you or family members have had with anesthetic medicines. °· Any blood disorders you have. °· Any surgeries you have had. °· Any medical conditions you have. °· Whether you are pregnant or may be pregnant. °What are the risks? °Generally, this is a safe procedure. However, problems may occur, including: °· Infection. °· Bleeding. °· Allergic reactions to medicines. °· Damage to other structures or organs. °· A stone remaining in the common bile duct. The common bile duct carries bile from the gallbladder into the small intestine. °· A bile leak from the cyst duct that is clipped when your gallbladder is removed. ° °What happens before the procedure? °Staying hydrated °Follow instructions from your health care provider about hydration, which may include: °· Up to 2 hours before the procedure -  you may continue to drink clear liquids, such as water, clear fruit juice, black coffee, and plain tea. ° °Eating and drinking restrictions °Follow instructions from your health care provider about eating and drinking, which may include: °· 8 hours before the procedure - stop eating heavy meals or foods such as meat, fried foods, or fatty foods. °· 6 hours before the procedure - stop eating light meals or foods, such as toast or cereal. °· 6 hours before the procedure - stop drinking milk or drinks that contain milk. °· 2 hours before the procedure - stop drinking clear liquids. ° °Medicines °· Ask your health care provider about: °? Changing or stopping your regular medicines. This is especially important if you are taking diabetes medicines or blood thinners. °? Taking medicines such as aspirin and ibuprofen. These medicines can thin your blood. Do not take these medicines before your procedure if your health care provider instructs you not to. °· You may be given antibiotic medicine to help prevent infection. °General instructions °· Let your health care provider know if you develop a cold or an infection before surgery. °· Plan to have someone take you home from the hospital or clinic. °· Ask your health care provider how your surgical site will be marked or identified. °What happens during the procedure? °· To reduce your risk of infection: °? Your health care team will wash or sanitize their hands. °? Your skin will be washed with soap. °? Hair may be removed from the surgical area. °· An IV tube may be inserted into one of your veins. °· You will be given one or more of the following: °? A medicine to help you relax (sedative). °?   A medicine to make you fall asleep (general anesthetic).  A breathing tube will be placed in your mouth.  Your surgeon will make several small cuts (incisions) in your abdomen.  The laparoscope will be inserted through one of the small incisions. The camera on the laparoscope  will send images to a TV screen (monitor) in the operating room. This lets your surgeon see inside your abdomen.  Air-like gas will be pumped into your abdomen. This will expand your abdomen to give the surgeon more room to perform the surgery.  Other tools that are needed for the procedure will be inserted through the other incisions. The gallbladder will be removed through one of the incisions.  Your common bile duct may be examined. If stones are found in the common bile duct, they may be removed.  After your gallbladder has been removed, the incisions will be closed with stitches (sutures), staples, or skin glue.  Your incisions may be covered with a bandage (dressing). The procedure may vary among health care providers and hospitals. What happens after the procedure?  Your blood pressure, heart rate, breathing rate, and blood oxygen level will be monitored until the medicines you were given have worn off.  You will be given medicines as needed to control your pain.  Do not drive for 24 hours if you were given a sedative. This information is not intended to replace advice given to you by your health care provider. Make sure you discuss any questions you have with your health care provider.  General Anesthesia, Adult, Care After These instructions provide you with information about caring for yourself after your procedure. Your health care provider may also give you more specific instructions. Your treatment has been planned according to current medical practices, but problems sometimes occur. Call your health care provider if you have any problems or questions after your procedure. What can I expect after the procedure? After the procedure, it is common to have:  Vomiting.  A sore throat.  Mental slowness.  It is common to feel:  Nauseous.  Cold or shivery.  Sleepy.  Tired.  Sore or achy, even in parts of your body where you did not have surgery.  Follow these  instructions at home: For at least 24 hours after the procedure:  Do not: ? Participate in activities where you could fall or become injured. ? Drive. ? Use heavy machinery. ? Drink alcohol. ? Take sleeping pills or medicines that cause drowsiness. ? Make important decisions or sign legal documents. ? Take care of children on your own.  Rest. Eating and drinking  If you vomit, drink water, juice, or soup when you can drink without vomiting.  Drink enough fluid to keep your urine clear or pale yellow.  Make sure you have little or no nausea before eating solid foods.  Follow the diet recommended by your health care provider. General instructions  Have a responsible adult stay with you until you are awake and alert.  Return to your normal activities as told by your health care provider. Ask your health care provider what activities are safe for you.  Take over-the-counter and prescription medicines only as told by your health care provider.  If you smoke, do not smoke without supervision.  Keep all follow-up visits as told by your health care provider. This is important. Contact a health care provider if:  You continue to have nausea or vomiting at home, and medicines are not helpful.  You cannot drink fluids or  start eating again.  You cannot urinate after 8-12 hours.  You develop a skin rash.  You have fever.  You have increasing redness at the site of your procedure. Get help right away if:  You have difficulty breathing.  You have chest pain.  You have unexpected bleeding.  You feel that you are having a life-threatening or urgent problem. This information is not intended to replace advice given to you by your health care provider. Make sure you discuss any questions you have with your health care provider. Document Released: 05/27/2000 Document Revised: 07/24/2015 Document Reviewed: 02/02/2015 Elsevier Interactive Patient Education  2018 [place] Released: 02/18/2005 Document Revised: 09/10/2015 Document Reviewed: 08/07/2015 Elsevier Interactive Patient Education  2018 [employer].

## 2017-07-25 NOTE — Anesthesia Postprocedure Evaluation (Signed)
Anesthesia Post Note  Patient: Rodney Hall.  Procedure(s) Performed: LAPAROSCOPIC CHOLECYSTECTOMY WITH INTRAOPERATIVE CHOLANGIOGRAM (N/A Abdomen)     Patient location during evaluation: PACU Anesthesia Type: General Level of consciousness: awake and alert Pain management: pain level controlled Vital Signs Assessment: post-procedure vital signs reviewed and stable Respiratory status: spontaneous breathing, nonlabored ventilation, respiratory function stable and patient connected to nasal cannula oxygen Cardiovascular status: blood pressure returned to baseline and stable Postop Assessment: no apparent nausea or vomiting Anesthetic complications: no    Last Vitals:  Vitals:   07/25/17 1000 07/25/17 1015  BP: 135/71 135/64  Pulse: 82 83  Resp: 17 19  Temp:  36.6 C  SpO2: 100% 96%    Last Pain:  Vitals:   07/25/17 1015  TempSrc:   PainSc: 0-No pain                 Effie Berkshire

## 2017-07-25 NOTE — Interval H&P Note (Signed)
History and Physical Interval Note:  07/25/2017 7:30 AM  Rodney Hall.  has presented today for surgery, with the diagnosis of GALLSTONES PANCREATITIS RECURRENT  The various methods of treatment have been discussed with the patient and family. After consideration of risks, benefits and other options for treatment, the patient has consented to  Procedure(s): LAPAROSCOPIC CHOLECYSTECTOMY WITH INTRAOPERATIVE CHOLANGIOGRAM (N/A) as a surgical intervention .  The patient's history has been reviewed, patient examined, no change in status, stable for surgery.  I have reviewed the patient's chart and labs.  Questions were answered to the patient's satisfaction.     Pedro Earls

## 2017-08-19 ENCOUNTER — Encounter: Payer: Self-pay | Admitting: Gastroenterology

## 2017-08-19 ENCOUNTER — Ambulatory Visit (INDEPENDENT_AMBULATORY_CARE_PROVIDER_SITE_OTHER): Payer: PPO | Admitting: Gastroenterology

## 2017-08-19 VITALS — BP 115/71 | HR 92 | Temp 97.7°F | Ht 71.0 in | Wt 191.8 lb

## 2017-08-19 DIAGNOSIS — K8050 Calculus of bile duct without cholangitis or cholecystitis without obstruction: Secondary | ICD-10-CM | POA: Diagnosis not present

## 2017-08-19 DIAGNOSIS — K2289 Other specified disease of esophagus: Secondary | ICD-10-CM

## 2017-08-19 DIAGNOSIS — K228 Other specified diseases of esophagus: Secondary | ICD-10-CM | POA: Diagnosis not present

## 2017-08-19 NOTE — Progress Notes (Signed)
Jonathon Bellows MD, MRCP(U.K) [address]  [place]  [place], [place] [address]  Main: [phone]  Fax: [phone]   Primary Care Physician: Carmon Ginsberg, [place]  Primary Gastroenterologist:  Dr. Jonathon Bellows   Chief Complaint  Patient presents with  . 6 week Follow Up    HPI: Cebert Dettmann. is a 72 y.o. male   Summary of history :  He was recently admitted on 06/02/17 for elevated liver enzymes and abdominal pain. On admission he had an elevated WCC , alk phos , ALT,AST, T bilirubin . USG gall bladder showed cholecystitis . 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 Lesion felt to be hepatic in origin . No choledocholithiasis.Recalls 25 years back had an episode of pancreatitis similar pain to present, complicated with a pseudocyst and subsequent surgical drainage. Marland Kitchen He was seen by Dr Hampton Abbot in surgery - no plan for surgery during the admission    Interval history   07/02/2017-  08/19/2017  EUS , surgery reports reviewed .   Dr Hampton Abbot was not available for follow up and hence he was seen by [facility] surgery and underwent a cholecystectomy on 07/25/17   Hepatic Function Latest Ref Rng & Units 07/02/2017 06/04/2017 06/03/2017  Total Protein 6.5 - 8.1 g/dL 7.4 7.3 7.0  Albumin 3.5 - 5.0 g/dL 3.7 3.1(L) 3.0(L)  AST 15 - 41 U/L 21 83(H) 183(H)  ALT 17 - 63 U/L 12(L) 175(H) 245(H)  Alk Phosphatase 38 - 126 U/L 87 287(H) 298(H)  Total Bilirubin 0.3 - 1.2 mg/dL 0.7 1.2 4.0(H)  Bilirubin, Direct 0.1 - 0.5 mg/dL - 0.5 2.6(H)    On 07/10/17 he underwent an EUS: A round intramural (subepithelial) lesion was found at the level of the GE junction. The lesion was isoechoic and measured 6cm. The endosonographic borders were well-defined and it appeared tocommunicate with the deep mucosa, submucosa layer of the distal esophagus. Fine needle aspiration for cytology was performed. Pancreatic parenchyma was  normal throughout the gland.Dr Ardis Hughs suggested that  the lesion appears to be causing mass effect it should be considered for resection even though it is almost certainly benign.FNA was benign .   He says that he was advised that the cyst according to Dr Hassell Done was probably long standing and has been advised that he will follow it up in a few months. No symptoms at present.   Current Outpatient Medications  Medication Sig Dispense Refill  . hydrocortisone cream 1 % Apply 1 application topically daily.    Marland Kitchen imipramine (TOFRANIL) 50 MG tablet Take 50 mg by mouth at bedtime.     . pentosan polysulfate (ELMIRON) 100 MG capsule Take 100 mg by mouth 3 (three) times daily.     . pseudoephedrine (SUDAFED) 120 MG 12 hr tablet Take 120 mg by mouth daily as needed for congestion.    . sodium chloride (OCEAN) 0.65 % SOLN nasal spray Place 1 spray into both nostrils as needed for congestion.    . tamsulosin (FLOMAX) 0.4 MG CAPS capsule Take 0.4 mg by mouth daily after supper.     Marland Kitchen acetaminophen (TYLENOL) 325 MG tablet Take 650 mg by mouth every 6 (six) hours as needed for moderate pain or headache.    Marland Kitchen HYDROcodone-acetaminophen (NORCO/VICODIN) 5-325 MG tablet Take 1 tablet by mouth every 6 (six) hours as needed for moderate pain. (Patient not taking: Reported on 08/19/2017)  20 tablet 0   No current facility-administered medications for this visit.     Allergies as of 08/19/2017 - Review Complete 08/19/2017  Allergen Reaction Noted  . Amoxicillin Rash 04/06/2015    ROS:  General: Negative for anorexia, weight loss, fever, chills, fatigue, weakness. ENT: Negative for hoarseness, difficulty swallowing , nasal congestion. CV: Negative for chest pain, angina, palpitations, dyspnea on exertion, peripheral edema.  Respiratory: Negative for dyspnea at rest, dyspnea on exertion, cough, sputum, wheezing.  GI: See history of present illness. GU:  Negative for dysuria, hematuria, urinary incontinence, urinary  frequency, nocturnal urination.  Endo: Negative for unusual weight change.    Physical Examination:   BP 115/71   Pulse 92   Temp 97.7 F (36.5 C) (Oral)   Ht _0  (1.803 m)   Wt 191 lb 12.8 oz (87 kg)   BMI 26.75 kg/m   General: Well-nourished, well-developed in no acute distress.  Eyes: No icterus. Conjunctivae pink. Mouth: Oropharyngeal mucosa moist and pink , no lesions erythema or exudate. Lungs: Clear to auscultation bilaterally. Non-labored. Heart: Regular rate and rhythm, no murmurs rubs or gallops.  Abdomen: Bowel sounds are normal, nontender, nondistended, no hepatosplenomegaly or masses, no abdominal bruits or hernia , no rebound or guarding.   Extremities: No lower extremity edema. No clubbing or deformities. Neuro: Alert and oriented x 3.  Grossly intact. Skin: Warm and dry, no jaundice.   Psych: Alert and cooperative, normal mood and affect.   Imaging Studies: Dg Cholangiogram Operative  Result Date: 07/25/2017 CLINICAL DATA:  Cholecystectomy for cholelithiasis and recurrent pancreatitis. EXAM: INTRAOPERATIVE CHOLANGIOGRAM TECHNIQUE: Cholangiographic images from the C-arm fluoroscopic device were submitted for interpretation post-operatively. Please see the procedural report for the amount of contrast and the fluoroscopy time utilized. COMPARISON:  Abdominal ultrasound on 06/02/2017 FINDINGS: Intraoperative cholangiogram images obtained with a C-arm demonstrate no evidence of biliary obstruction or filling defect. Right lobe intrahepatic ducts that are opacified appear mildly irregular in caliber. This is nonspecific. Contrast enters the duodenum normally. IMPRESSION: No filling defects or evidence of biliary obstruction. The opacified intrahepatic ducts in the right lobe appear mildly irregular in caliber. This is nonspecific. Electronically Signed   By: Aletta Edouard M.D.   On: 07/25/2017 09:18    Assessment and Plan:   Jeffrie Lofstrom. is a 72 y.o. y/o male ,  s/p cholecystectomy performed after he came in with symptomatic biliary colic. MRCP picked up a 6.6 cm mass at the GE junction appears extra luminal , with mass effect on the liver. H/o pancreatic cyst in the past but pancreas appears normal on MRCP. S/p cholecystectomy - doing well. Esopahgeal cysts will follow with Dr Hassell Done with whom he has a follow up appointment .   Dr Jonathon Bellows  MD,MRCP [facility]) Follow up  PRN

## 2017-09-11 DIAGNOSIS — Z86010 Personal history of colonic polyps: Secondary | ICD-10-CM | POA: Diagnosis not present

## 2017-09-13 IMAGING — RF DG FLUORO GUIDE NDL PLC/BX
1 series · 2 of 2 positions shown · non-contrast
Comparison: none

CLINICAL DATA: Left hip pain.

[Series 1: fluoro_iodine 2fps_bw · 0.18mm/px · 2 of 2 frames shown]
[frame 1/2]
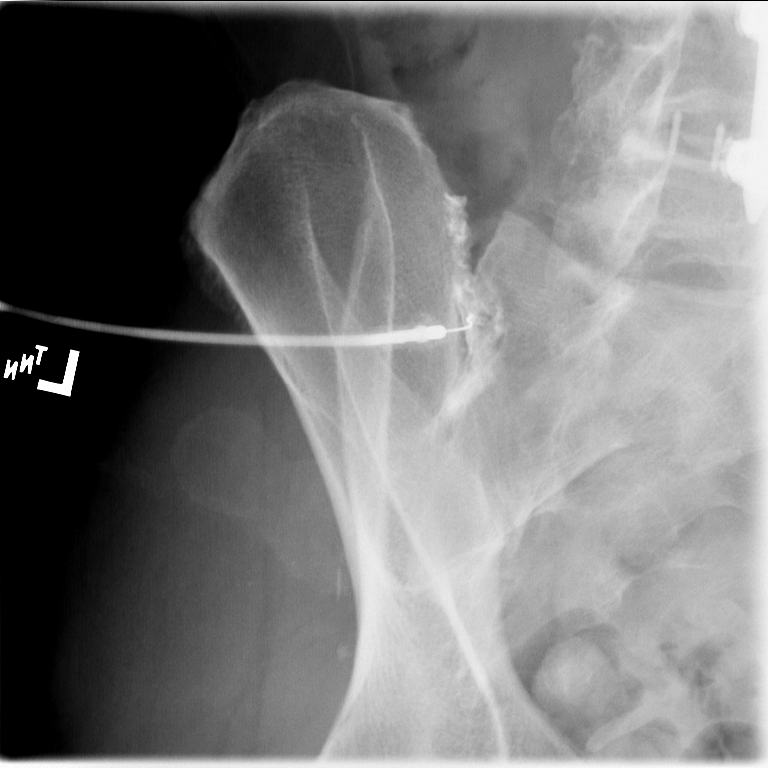
[frame 2/2]
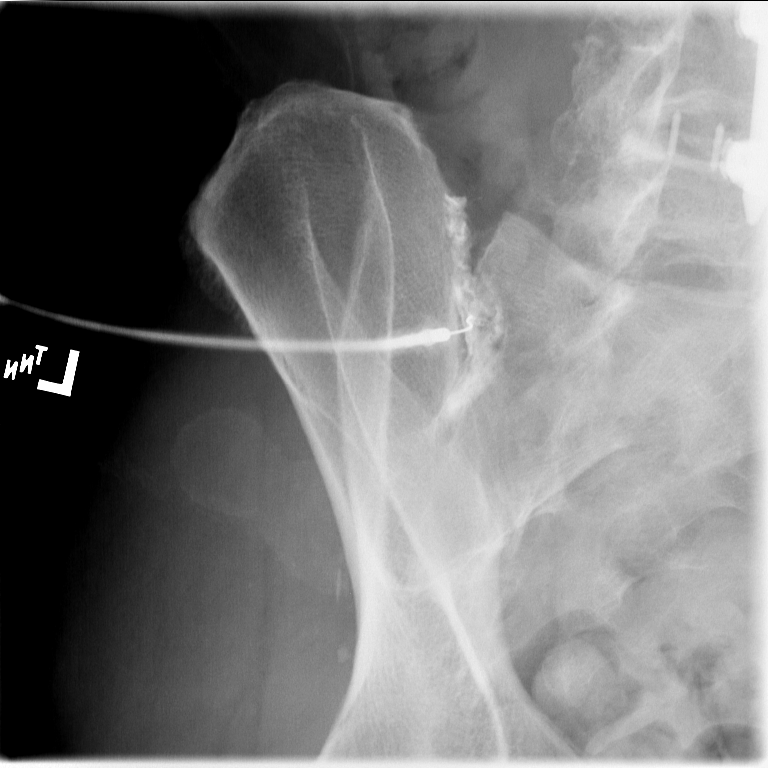

[2 of 2 positions shown; findings below may reference images not displayed]

EXAM:
LEFT SI JOINT INJECTION UNDER FLUOROSCOPY

FLUOROSCOPY TIME:  Fluoroscopy Time:  17 minutes 17 seconds

Radiation Exposure Index (if provided by the fluoroscopic device):
509.7 mGy

Number of Acquired Spot Images: 1

PROCEDURE:
After discussing the risks and benefits of this procedure the
patient informed consent was obtained. Left back was sterilely
prepped and draped. Following local anesthesia with 1% lidocaine a
22 gauge spinal needle was advanced into the SI joint under
fluoroscopic guidance. Degenerative change present making injury to
the joint difficult. Approximately 3 cc of 2sovue-UPP administered
into the joint space. This is followed by administration of 5 cc of
a standardized mixture bupivacaine and Depo-Medrol.
IMPRESSION: Successful left SI joint injection.

## 2017-10-20 DIAGNOSIS — N3020 Other chronic cystitis without hematuria: Secondary | ICD-10-CM | POA: Diagnosis not present

## 2017-10-20 DIAGNOSIS — N5201 Erectile dysfunction due to arterial insufficiency: Secondary | ICD-10-CM | POA: Diagnosis not present

## 2017-10-20 DIAGNOSIS — N401 Enlarged prostate with lower urinary tract symptoms: Secondary | ICD-10-CM | POA: Diagnosis not present

## 2017-10-20 DIAGNOSIS — N3281 Overactive bladder: Secondary | ICD-10-CM | POA: Diagnosis not present

## 2017-10-20 DIAGNOSIS — Z125 Encounter for screening for malignant neoplasm of prostate: Secondary | ICD-10-CM | POA: Diagnosis not present

## 2017-10-28 ENCOUNTER — Encounter: Payer: Self-pay | Admitting: Family Medicine

## 2017-10-28 ENCOUNTER — Ambulatory Visit (INDEPENDENT_AMBULATORY_CARE_PROVIDER_SITE_OTHER): Payer: PPO | Admitting: Family Medicine

## 2017-10-28 VITALS — BP 108/60 | HR 91 | Temp 97.6°F | Resp 16 | Wt 191.2 lb

## 2017-10-28 DIAGNOSIS — S39012A Strain of muscle, fascia and tendon of lower back, initial encounter: Secondary | ICD-10-CM

## 2017-10-28 MED ORDER — MELOXICAM 15 MG PO TABS: 15.0000 mg | ORAL_TABLET | Freq: Every day | ORAL | 0 refills | Status: DC

## 2017-10-28 NOTE — Patient Instructions (Signed)
May also use Tylenol up to 3000 mg/day. Add cold compress for 20 minutes several x day for the next 24-48 hours. Then switch to warm compresses as needed.

## 2017-10-28 NOTE — Progress Notes (Signed)
  Subjective:     Patient ID: Rodney Hall., male   DOB: 30-May-1945, 72 y.o.   MRN: [id number] Chief Complaint  Patient presents with  . Hip Pain    Patient comes in office today with complaints of left side hip pain that began suddenly yesterday. Patient describes pain as sharp and reports decreased mobility. Patient reports that he took [place] for relief.    HPI No radiation of pain. States he uses the exercise bike at the gym but no weight lifting. Prior hx of back surgery.  Review of Systems     Objective:   Physical Exam  Constitutional: He appears well-developed and well-nourished. He appears distressed (mild discomfort when changing positions).  Musculoskeletal:  Muscle strength in lower extremities 5/5. SLR's to 80 degrees without radiation of back pain. Localizes and is tender in the left SI area       Assessment:    1. Strain of lumbar region, initial encounter - meloxicam (MOBIC) 15 MG tablet; Take 1 tablet (15 mg total) by mouth daily.  Dispense: 30 tablet; Refill: 0    Plan:    Discussed use of Tylenol and cold/warm compresses.

## 2017-10-29 DIAGNOSIS — L570 Actinic keratosis: Secondary | ICD-10-CM | POA: Diagnosis not present

## 2017-10-29 DIAGNOSIS — L578 Other skin changes due to chronic exposure to nonionizing radiation: Secondary | ICD-10-CM | POA: Diagnosis not present

## 2017-10-29 DIAGNOSIS — D692 Other nonthrombocytopenic purpura: Secondary | ICD-10-CM | POA: Diagnosis not present

## 2017-10-29 DIAGNOSIS — Z85828 Personal history of other malignant neoplasm of skin: Secondary | ICD-10-CM | POA: Diagnosis not present

## 2017-12-11 ENCOUNTER — Encounter: Payer: Self-pay | Admitting: *Deleted

## 2017-12-12 ENCOUNTER — Ambulatory Visit
Admission: RE | Admit: 2017-12-12 | Discharge: 2017-12-12 | Disposition: A | Discharge status: Home / Self Care | Payer: PPO | Source: Ambulatory Visit | Attending: Unknown Physician Specialty | Admitting: Unknown Physician Specialty

## 2017-12-12 ENCOUNTER — Ambulatory Visit: Payer: PPO | Admitting: Anesthesiology

## 2017-12-12 ENCOUNTER — Encounter
Admission: RE | Disposition: A | Discharge status: Home / Self Care | Payer: Self-pay | Source: Ambulatory Visit | Attending: Unknown Physician Specialty

## 2017-12-12 ENCOUNTER — Encounter: Payer: Self-pay | Admitting: Anesthesiology

## 2017-12-12 DIAGNOSIS — Z1211 Encounter for screening for malignant neoplasm of colon: Secondary | ICD-10-CM | POA: Diagnosis not present

## 2017-12-12 DIAGNOSIS — Z881 Allergy status to other antibiotic agents status: Secondary | ICD-10-CM | POA: Diagnosis not present

## 2017-12-12 DIAGNOSIS — K5730 Diverticulosis of large intestine without perforation or abscess without bleeding: Secondary | ICD-10-CM | POA: Insufficient documentation

## 2017-12-12 DIAGNOSIS — F419 Anxiety disorder, unspecified: Secondary | ICD-10-CM | POA: Diagnosis not present

## 2017-12-12 DIAGNOSIS — D125 Benign neoplasm of sigmoid colon: Secondary | ICD-10-CM | POA: Insufficient documentation

## 2017-12-12 DIAGNOSIS — Z86010 Personal history of colonic polyps: Secondary | ICD-10-CM | POA: Insufficient documentation

## 2017-12-12 DIAGNOSIS — K640 First degree hemorrhoids: Secondary | ICD-10-CM | POA: Diagnosis not present

## 2017-12-12 DIAGNOSIS — K5790 Diverticulosis of intestine, part unspecified, without perforation or abscess without bleeding: Secondary | ICD-10-CM | POA: Diagnosis not present

## 2017-12-12 DIAGNOSIS — M1990 Unspecified osteoarthritis, unspecified site: Secondary | ICD-10-CM | POA: Insufficient documentation

## 2017-12-12 DIAGNOSIS — Z791 Long term (current) use of non-steroidal anti-inflammatories (NSAID): Secondary | ICD-10-CM | POA: Diagnosis not present

## 2017-12-12 DIAGNOSIS — K635 Polyp of colon: Secondary | ICD-10-CM | POA: Diagnosis not present

## 2017-12-12 DIAGNOSIS — Z9049 Acquired absence of other specified parts of digestive tract: Secondary | ICD-10-CM | POA: Diagnosis not present

## 2017-12-12 DIAGNOSIS — Z79899 Other long term (current) drug therapy: Secondary | ICD-10-CM | POA: Insufficient documentation

## 2017-12-12 DIAGNOSIS — K648 Other hemorrhoids: Secondary | ICD-10-CM | POA: Insufficient documentation

## 2017-12-12 HISTORY — DX: Interstitial cystitis (chronic) without hematuria: N30.10

## 2017-12-12 HISTORY — DX: Intra-abdominal and pelvic swelling, mass and lump, unspecified site: R19.00

## 2017-12-12 HISTORY — DX: Benign prostatic hyperplasia with lower urinary tract symptoms: N40.1

## 2017-12-12 HISTORY — DX: Benign prostatic hyperplasia with lower urinary tract symptoms: N13.8

## 2017-12-12 HISTORY — DX: Cyst of pancreas: K86.2

## 2017-12-12 HISTORY — PX: COLONOSCOPY WITH PROPOFOL: SHX5780

## 2017-12-12 SURGERY — COLONOSCOPY WITH PROPOFOL
Anesthesia: General

## 2017-12-12 MED ORDER — PROPOFOL 500 MG/50ML IV EMUL
INTRAVENOUS | Status: AC
  Filled 2017-12-12: qty 50

## 2017-12-12 MED ORDER — PROPOFOL 10 MG/ML IV BOLUS
INTRAVENOUS | Status: DC | PRN
  Administered 2017-12-12 (×2): 50 mg via INTRAVENOUS

## 2017-12-12 MED ORDER — SODIUM CHLORIDE 0.9 % IV SOLN
INTRAVENOUS | Status: DC
  Administered 2017-12-12: 1000 mL via INTRAVENOUS

## 2017-12-12 MED ORDER — SODIUM CHLORIDE 0.9 % IV SOLN: INTRAVENOUS | Status: DC

## 2017-12-12 MED ORDER — PROPOFOL 500 MG/50ML IV EMUL
INTRAVENOUS | Status: DC | PRN
  Administered 2017-12-12: 120 ug/kg/min via INTRAVENOUS

## 2017-12-12 MED ORDER — PHENYLEPHRINE HCL 10 MG/ML IJ SOLN
INTRAMUSCULAR | Status: DC | PRN
  Administered 2017-12-12 (×3): 100 ug via INTRAVENOUS

## 2017-12-12 NOTE — H&P (Signed)
Primary Care Physician:  Carmon Ginsberg, [place] Primary Gastroenterologist:  Dr. Vira Agar  Pre-Procedure History & Physical: HPI:  Rodney Hall. is a 72 y.o. male is here for an colonoscopy. Done for personal history of colon polyps.  Last colonoscopy 08/11/12   Past Medical History:  Diagnosis Date  . Abdominal mass   . Abdominal pain 06/02/2017  . Anxiety    Pt. denies  . Arthritis    pt. denies  . BPH with obstruction/lower urinary tract symptoms   . History of actinic keratoses   . Hyperglycemia   . Interstitial cystitis   . Pancreatic cyst   . Pneumonia    27 years ago    Past Surgical History:  Procedure Laterality Date  . ANTERIOR LAT LUMBAR FUSION Right 07/30/2016   Procedure: Right Lateral two-three Lateral transpsoas interbody fusion with lateral plating/Minimally invasive decompression at Lumbar two-three;  Surgeon: Ditty, Kevan Ny, MD;  Location: [facility];  Service: Neurosurgery;  Laterality: Right;  . BACK SURGERY  2012  . CATARACT EXTRACTION, BILATERAL    . CHOLECYSTECTOMY     07-25-17 Dr. Hassell Done  . CHOLECYSTECTOMY N/A 07/25/2017   Procedure: LAPAROSCOPIC CHOLECYSTECTOMY WITH INTRAOPERATIVE CHOLANGIOGRAM;  Surgeon: Johnathan Hausen, MD;  Location: [facility];  Service: General;  Laterality: N/A;  . COLONOSCOPY W/ POLYPECTOMY    . EUS N/A 07/10/2017   Procedure: UPPER ENDOSCOPIC ULTRASOUND (EUS) LINEAR;  Surgeon: [name], MD;  Location: [facility];  Service: Endoscopy;  Laterality: N/A;  . EYE SURGERY    . NECK SURGERY  2000  . TENNIS ELBOW RELEASE/NIRSCHEL PROCEDURE    . TONSILLECTOMY AND ADENOIDECTOMY      Prior to Admission medications   Medication Sig Start Date End Date Taking? Authorizing Provider  hydrOXYzine (ATARAX/VISTARIL) 50 MG tablet Take 50 mg by mouth 3 (three) times daily as needed.   Yes [provider]  acetaminophen (TYLENOL) 325 MG tablet Take 650 mg by mouth every 6 (six) hours as needed for moderate pain or headache.     [provider]  imipramine (TOFRANIL) 50 MG tablet Take 50 mg by mouth at bedtime.     [provider]  meloxicam (MOBIC) 15 MG tablet Take 1 tablet (15 mg total) by mouth daily. 10/28/17   Carmon Ginsberg, PA  pentosan polysulfate (ELMIRON) 100 MG capsule Take 100 mg by mouth 3 (three) times daily.     [provider]  sodium chloride (OCEAN) 0.65 % SOLN nasal spray Place 1 spray into both nostrils as needed for congestion.    [provider]  tamsulosin (FLOMAX) 0.4 MG CAPS capsule Take 0.4 mg by mouth daily after supper.     [provider]    Allergies as of 09/29/2017 - Review Complete 08/19/2017  Allergen Reaction Noted  . Amoxicillin Rash 04/06/2015    Family History  Problem Relation Age of Onset  . Cancer Mother        brain    Social History   Socioeconomic History  . Marital status: Married    Spouse name: Not on file  . Number of children: Not on file  . Years of education: Not on file  . Highest education level: Not on file  Occupational History  . Not on file  Social Needs  . Financial resource strain: Not on file  . Food insecurity:    Worry: Not on file    Inability: Not on file  . Transportation needs:    Medical: Not  on file    Non-medical: Not on file  Tobacco Use  . Smoking status: Never Smoker  . Smokeless tobacco: Never Used  Substance and Sexual Activity  . Alcohol use: No    Alcohol/week: 0.0 standard drinks  . Drug use: No  . Sexual activity: Yes  Lifestyle  . Physical activity:    Days per week: Not on file    Minutes per session: Not on file  . Stress: Not on file  Relationships  . Social connections:    Talks on phone: Not on file    Gets together: Not on file    Attends religious service: Not on file    Active member of club or organization: Not on file    Attends meetings of clubs or organizations: Not on file    Relationship status: Not on file  . Intimate partner violence:    Fear  of current or ex partner: Not on file    Emotionally abused: Not on file    Physically abused: Not on file    Forced sexual activity: Not on file  Other Topics Concern  . Not on file  Social History Narrative  . Not on file    Review of Systems: See HPI, otherwise negative ROS  Physical Exam: BP 122/85   Pulse 80   Temp (!) 96.7 F (35.9 C) (Tympanic)   Resp 20   Ht 5\' 11"  (1.803 m)   Wt 85.3 kg   SpO2 100%   BMI 26.22 kg/m  General:   Alert,  pleasant and cooperative in NAD Head:  Normocephalic and atraumatic. Neck:  Supple; no masses or thyromegaly. Lungs:  Clear throughout to auscultation.    Heart:  Regular rate and rhythm. Abdomen:  Soft, nontender and nondistended. Normal bowel sounds, without guarding, and without rebound.   Neurologic:  Alert and  oriented x4;  grossly normal neurologically.  Impression/Plan: Rodney Hall. is here for an colonoscopy to be performed for [facility] colon polyps.  Risks, benefits, limitations, and alternatives regarding  colonoscopy have been reviewed with the patient.  Questions have been answered.  All parties agreeable.   Gaylyn Cheers, MD  12/12/2017, 10:45 AM

## 2017-12-12 NOTE — Anesthesia Preprocedure Evaluation (Signed)
Anesthesia Evaluation  Patient identified by MRN, date of birth, ID band Patient awake    Reviewed: Allergy & Precautions, NPO status , Patient's Chart, lab work & pertinent test results, reviewed documented beta blocker date and time   Airway Mallampati: II  TM Distance: >3 FB     Dental  (+) Chipped   Pulmonary pneumonia, resolved,           Cardiovascular      Neuro/Psych Anxiety    GI/Hepatic   Endo/Other    Renal/GU      Musculoskeletal  (+) Arthritis ,   Abdominal   Peds  Hematology   Anesthesia Other Findings   Reproductive/Obstetrics                             Anesthesia Physical Anesthesia Plan  ASA: III  Anesthesia Plan: General   Post-op Pain Management:    Induction: Intravenous  PONV Risk Score and Plan:   Airway Management Planned:   Additional Equipment:   Intra-op Plan:   Post-operative Plan:   Informed Consent: I have reviewed the patients History and Physical, chart, labs and discussed the procedure including the risks, benefits and alternatives for the proposed anesthesia with the patient or authorized representative who has indicated his/her understanding and acceptance.     Plan Discussed with: CRNA  Anesthesia Plan Comments:         Anesthesia Quick Evaluation

## 2017-12-12 NOTE — Anesthesia Postprocedure Evaluation (Signed)
Anesthesia Post Note  Patient: Rodney Hall.  Procedure(s) Performed: COLONOSCOPY WITH PROPOFOL (N/A )  Patient location during evaluation: Endoscopy Anesthesia Type: General Level of consciousness: awake and alert Pain management: pain level controlled Vital Signs Assessment: post-procedure vital signs reviewed and stable Respiratory status: spontaneous breathing, nonlabored ventilation, respiratory function stable and patient connected to nasal cannula oxygen Cardiovascular status: blood pressure returned to baseline and stable Postop Assessment: no apparent nausea or vomiting Anesthetic complications: no     Last Vitals:  Vitals:   12/12/17 1030 12/12/17 1133  BP: 122/85 96/67  Pulse: 80 77  Resp: 20 19  Temp: (!) 35.9 C (!) 35.8 C  SpO2: 100% 100%    Last Pain:  Vitals:   12/12/17 1133  TempSrc:   PainSc: [place]

## 2017-12-12 NOTE — Op Note (Signed)
[facility] Gastroenterology Patient Name: Rodney Hall Procedure Date: 12/12/2017 10:37 AM MRN: [id number] Account #: [id number] Date of Birth: 1945/05/26 Admit Type: Outpatient Age: 72 Room: [facility] ENDO ROOM 1 Gender: Male Note Status: Finalized Procedure:            Colonoscopy Indications:          High risk colon cancer surveillance: Personal history                        of colonic polyps Providers:            Manya Silvas, MD Medicines:            Propofol per Anesthesia Complications:        No immediate complications. Procedure:            Pre-Anesthesia Assessment:                       - After reviewing the risks and benefits, the patient                        was deemed in satisfactory condition to undergo the                        procedure.                       After obtaining informed consent, the colonoscope was                        passed under direct vision. Throughout the procedure,                        the patient's blood pressure, pulse, and oxygen                        saturations were monitored continuously. The                        Colonoscope was introduced through the anus and                        advanced to the the cecum, identified by appendiceal                        orifice and ileocecal valve. The colonoscopy was                        somewhat difficult due to a tortuous colon. The patient                        tolerated the procedure well. The quality of the bowel                        preparation was good. Findings:      Multiple small and large-mouthed diverticula were found in the sigmoid       colon, descending colon, transverse colon and ascending colon.      A diminutive polyp was found in the distal sigmoid colon. The polyp was       sessile. The polyp was removed with a hot snare. Polyp resection was  incomplete, and the resected tissue was not retrieved.      Internal hemorrhoids were found  during endoscopy. The hemorrhoids were       small and Grade I (internal hemorrhoids that do not prolapse).      The exam was otherwise without abnormality. Impression:           - Diverticulosis in the sigmoid colon, in the                        descending colon, in the transverse colon and in the                        ascending colon.                       - One diminutive polyp in the distal sigmoid colon,                        removed with a hot snare. Incomplete resection.                        Resected tissue not retrieved.                       - Internal hemorrhoids.                       - The examination was otherwise normal. Recommendation:       - Await pathology results. Manya Silvas, MD 12/12/2017 11:20:40 AM This report has been signed electronically. Number of Addenda: 0 Note Initiated On: 12/12/2017 10:37 AM Scope Withdrawal Time: 0 hours 9 minutes 27 seconds  Total Procedure Duration: 0 hours 21 minutes 38 seconds       [facility]

## 2017-12-12 NOTE — Anesthesia Post-op Follow-up Note (Signed)
Anesthesia QCDR form completed.        

## 2017-12-12 NOTE — Transfer of Care (Signed)
Immediate Anesthesia Transfer of Care Note  Patient: Rodney Hall.  Procedure(s) Performed: COLONOSCOPY WITH PROPOFOL (N/A )  Patient Location: PACU and Endoscopy Unit  Anesthesia Type:General  Level of Consciousness: awake  Airway & Oxygen Therapy: Patient Spontanous Breathing  Post-op Assessment: Report given to RN  Post vital signs: Reviewed and stable  Last Vitals:  Vitals Value Taken Time  BP    Temp    Pulse 78 12/12/2017 11:33 AM  Resp 18 12/12/2017 11:33 AM  SpO2 100 % 12/12/2017 11:33 AM  Vitals shown include unvalidated device data.  Last Pain:  Vitals:   12/12/17 1030  TempSrc: Tympanic  PainSc: 0-No pain         Complications: No apparent anesthesia complications

## 2018-02-20 DIAGNOSIS — M5416 Radiculopathy, lumbar region: Secondary | ICD-10-CM | POA: Diagnosis not present

## 2018-03-11 DIAGNOSIS — M545 Low back pain: Secondary | ICD-10-CM | POA: Diagnosis not present

## 2018-03-11 DIAGNOSIS — M6281 Muscle weakness (generalized): Secondary | ICD-10-CM | POA: Diagnosis not present

## 2018-03-13 DIAGNOSIS — M545 Low back pain: Secondary | ICD-10-CM | POA: Diagnosis not present

## 2018-03-13 DIAGNOSIS — M6281 Muscle weakness (generalized): Secondary | ICD-10-CM | POA: Diagnosis not present

## 2018-05-07 DIAGNOSIS — Z85828 Personal history of other malignant neoplasm of skin: Secondary | ICD-10-CM | POA: Diagnosis not present

## 2018-05-07 DIAGNOSIS — L578 Other skin changes due to chronic exposure to nonionizing radiation: Secondary | ICD-10-CM | POA: Diagnosis not present

## 2018-05-07 DIAGNOSIS — L570 Actinic keratosis: Secondary | ICD-10-CM | POA: Diagnosis not present

## 2018-05-07 DIAGNOSIS — H61001 Unspecified perichondritis of right external ear: Secondary | ICD-10-CM | POA: Diagnosis not present

## 2018-05-07 DIAGNOSIS — B351 Tinea unguium: Secondary | ICD-10-CM | POA: Diagnosis not present

## 2018-05-07 DIAGNOSIS — Z1283 Encounter for screening for malignant neoplasm of skin: Secondary | ICD-10-CM | POA: Diagnosis not present

## 2018-05-07 DIAGNOSIS — D1800 Hemangioma unspecified site: Secondary | ICD-10-CM | POA: Diagnosis not present

## 2018-05-07 DIAGNOSIS — L821 Other seborrheic keratosis: Secondary | ICD-10-CM | POA: Diagnosis not present

## 2018-05-07 DIAGNOSIS — L820 Inflamed seborrheic keratosis: Secondary | ICD-10-CM | POA: Diagnosis not present

## 2018-05-07 DIAGNOSIS — L812 Freckles: Secondary | ICD-10-CM | POA: Diagnosis not present

## 2018-06-11 ENCOUNTER — Other Ambulatory Visit: Payer: Self-pay

## 2018-06-11 ENCOUNTER — Ambulatory Visit (INDEPENDENT_AMBULATORY_CARE_PROVIDER_SITE_OTHER): Payer: PPO | Admitting: Family Medicine

## 2018-06-11 ENCOUNTER — Encounter: Payer: Self-pay | Admitting: Family Medicine

## 2018-06-11 VITALS — BP 117/75 | HR 95 | Temp 97.6°F | Resp 16 | Wt 198.0 lb

## 2018-06-11 DIAGNOSIS — I10 Essential (primary) hypertension: Secondary | ICD-10-CM | POA: Diagnosis not present

## 2018-06-11 DIAGNOSIS — B029 Zoster without complications: Secondary | ICD-10-CM

## 2018-06-11 DIAGNOSIS — F3342 Major depressive disorder, recurrent, in full remission: Secondary | ICD-10-CM | POA: Diagnosis not present

## 2018-06-11 DIAGNOSIS — M170 Bilateral primary osteoarthritis of knee: Secondary | ICD-10-CM | POA: Diagnosis not present

## 2018-06-11 DIAGNOSIS — R350 Frequency of micturition: Secondary | ICD-10-CM

## 2018-06-11 DIAGNOSIS — M1711 Unilateral primary osteoarthritis, right knee: Secondary | ICD-10-CM | POA: Diagnosis not present

## 2018-06-11 DIAGNOSIS — D500 Iron deficiency anemia secondary to blood loss (chronic): Secondary | ICD-10-CM | POA: Diagnosis not present

## 2018-06-11 MED ORDER — VALACYCLOVIR HCL 1 G PO TABS: 1000.0000 mg | ORAL_TABLET | Freq: Three times a day (TID) | ORAL | 0 refills | Status: AC

## 2018-06-11 MED ORDER — DOXYCYCLINE HYCLATE 100 MG PO TABS: 100.0000 mg | ORAL_TABLET | Freq: Two times a day (BID) | ORAL | 0 refills | Status: AC

## 2018-06-11 NOTE — Patient Instructions (Signed)
.   Please review the attached list of medications and notify my office if there are any errors.   . Please bring all of your medications to every appointment so we can make sure that our medication list is the same as yours.   

## 2018-06-11 NOTE — Progress Notes (Signed)
Patient: Rodney Hall. Male    DOB: 23-Feb-1946   73 y.o.   MRN: [id number] Visit Date: 06/11/2018  Today's Provider: Lelon Huh, MD   Chief Complaint  Patient presents with  . Rash   Subjective:     HPI  Prostate has felt irritated for 5 days. He does have history of enlarged prostate followed by Dr. Rogers Blocker with PSA yearly. He states current sx or identical to previous prostate infections, last treated in March 2019 with doxycycline which he states worked well and has not had any sx until the last few days.   Patient has rash in rectum for 6 days that is spreading and some burning. He is having some difficulty sleeping. He has tried some baby powder and hydrocortisone in the area, but has relieved the symptoms.    Allergies  Allergen Reactions  . Amoxicillin Rash    ++ got ceftriaxone in the past++ Has patient had a PCN reaction causing immediate rash, facial/tongue/throat swelling, SOB or lightheadedness with hypotension: Yes Has patient had a PCN reaction causing severe rash involving mucus membranes or skin necrosis: No Has patient had a PCN reaction that required hospitalization No Has patient had a PCN reaction occurring within the last 10 years: No If all of the above answers are "NO", then may proceed with Cephalosporin use.       Current Outpatient Medications:  .  acetaminophen (TYLENOL) 325 MG tablet, Take 650 mg by mouth every 6 (six) hours as needed for moderate pain or headache., Disp: , Rfl:  .  imipramine (TOFRANIL) 50 MG tablet, Take 50 mg by mouth at bedtime. , Disp: , Rfl:  .  pentosan polysulfate (ELMIRON) 100 MG capsule, Take 100 mg by mouth 3 (three) times daily. , Disp: , Rfl:  .  sodium chloride (OCEAN) 0.65 % SOLN nasal spray, Place 1 spray into both nostrils as needed for congestion., Disp: , Rfl:  .  tamsulosin (FLOMAX) 0.4 MG CAPS capsule, Take 0.4 mg by mouth daily after supper. , Disp: , Rfl:  .  hydrOXYzine (ATARAX/VISTARIL) 50 MG  tablet, Take 50 mg by mouth 3 (three) times daily as needed., Disp: , Rfl:  .  meloxicam (MOBIC) 15 MG tablet, Take 1 tablet (15 mg total) by mouth daily. (Patient not taking: Reported on 06/11/2018), Disp: 30 tablet, Rfl: 0  Review of Systems  Constitutional: Negative for appetite change, chills and fever.  Respiratory: Negative for chest tightness, shortness of breath and wheezing.   Cardiovascular: Negative for chest pain and palpitations.  Gastrointestinal: Negative for abdominal pain, nausea and vomiting.    Social History   Tobacco Use  . Smoking status: Never Smoker  . Smokeless tobacco: Never Used  Substance Use Topics  . Alcohol use: No    Alcohol/week: 0.0 standard drinks      Objective:   BP 117/75 (BP Location: Right Arm, Patient Position: Sitting, Cuff Size: Normal)   Pulse 95   Temp 97.6 F (36.4 C) (Oral)   Resp 16   Wt 198 lb (89.8 kg)   SpO2 97%   BMI 27.62 kg/m  Vitals:   06/11/18 1038  BP: 117/75  Pulse: 95  Resp: 16  Temp: 97.6 F (36.4 C)  TempSrc: Oral  SpO2: 97%  Weight: 198 lb (89.8 kg)     Physical Exam  General appearance: alert, well developed, well nourished, cooperative and in no distress Head: Normocephalic, without obvious abnormality, atraumatic Respiratory: Respirations even  and unlabored, normal respiratory rate Extremities: No gross deformities Skin: vesicular eruption on erythematous base starting at midline extending 3cm to the left of midline above anal opening consistent with Zoster.      Assessment & Plan    1. Urinary frequency C/w previous episodes of prostatitis.  - doxycycline (VIBRA-TABS) 100 MG tablet; Take 1 tablet (100 mg total) by mouth 2 (two) times daily for 14 days.  Dispense: 28 tablet; Refill: 0  2. Herpes zoster without complication  - valACYclovir (VALTREX) 1000 MG tablet; Take 1 tablet (1,000 mg total) by mouth every 8 (eight) hours for 7 days.  Dispense: 21 tablet; Refill: 0 He has had Zostavax, but  not Shingrix, advise he should get Shingrix within 6-12 months.   Call if symptoms change or if not rapidly improving.      Lelon Huh, MD  [facility]

## 2018-06-20 IMAGING — RF DG CHOLANGIOGRAM OPERATIVE
1 series · 4 of 4 positions shown · non-contrast
Comparison: Abdominal ultrasound on 06/02/2017

CLINICAL DATA: Cholecystectomy for cholelithiasis and recurrent
pancreatitis.

EXAM:
INTRAOPERATIVE CHOLANGIOGRAM
TECHNIQUE: Cholangiographic images from the C-arm fluoroscopic device were
submitted for interpretation post-operatively. Please see the
procedural report for the amount of contrast and the fluoroscopy
time utilized.

[Series 1: run · 4 of 117 frames shown]
[frame 18/117]
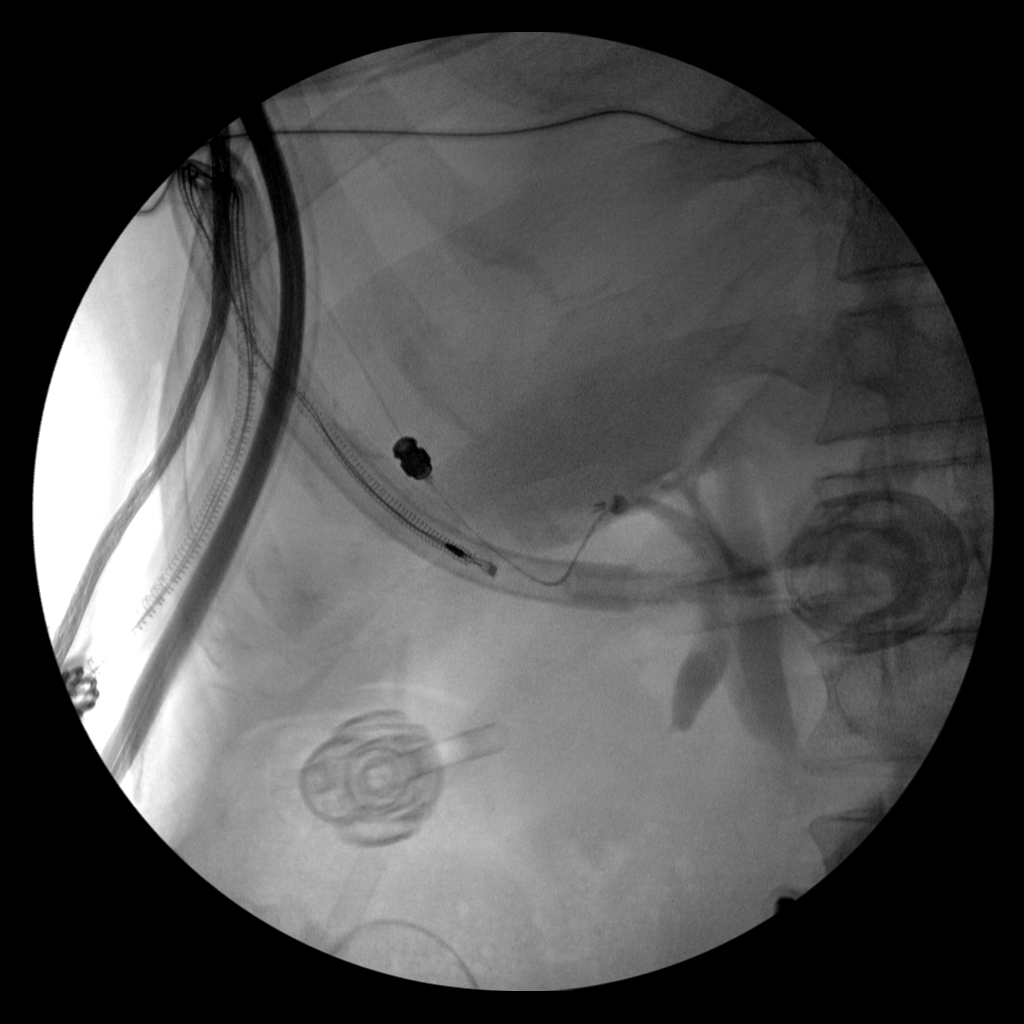
[frame 59/117]
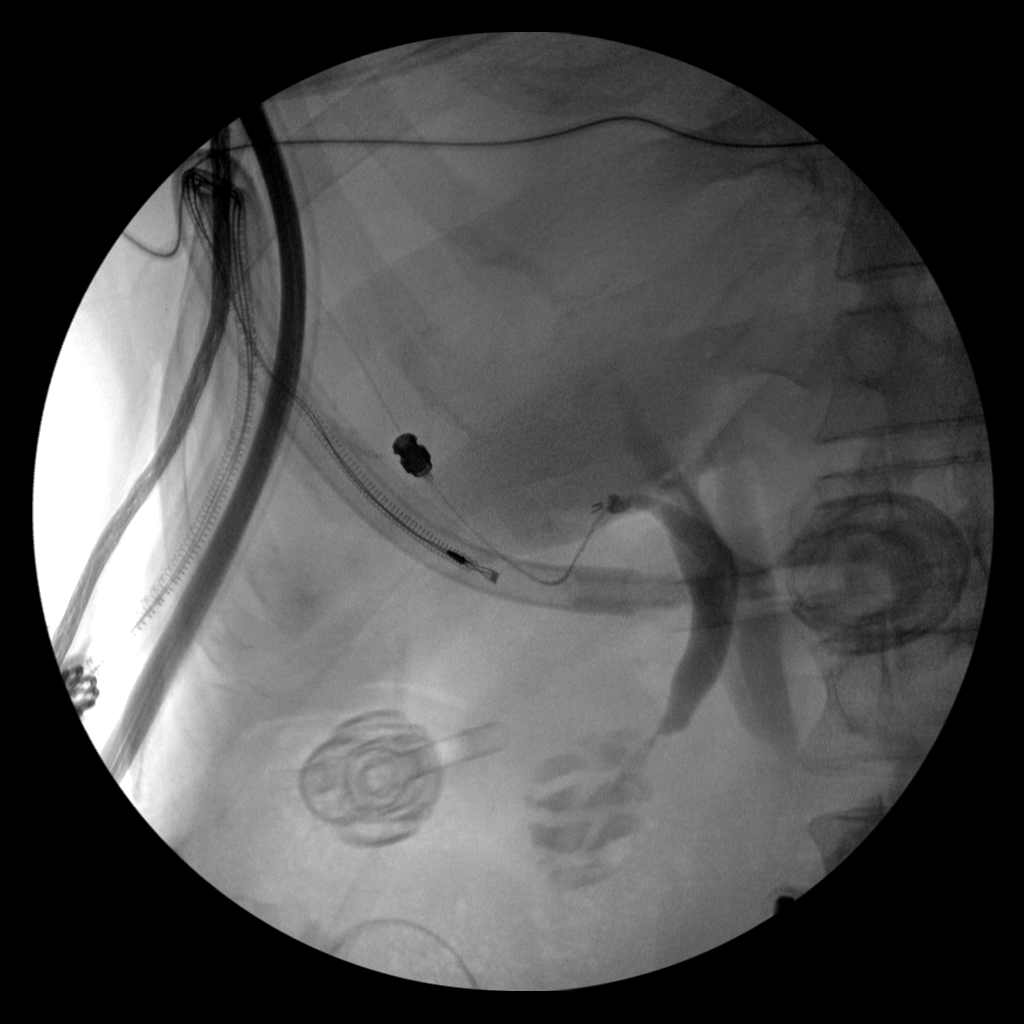
[frame 100/117]
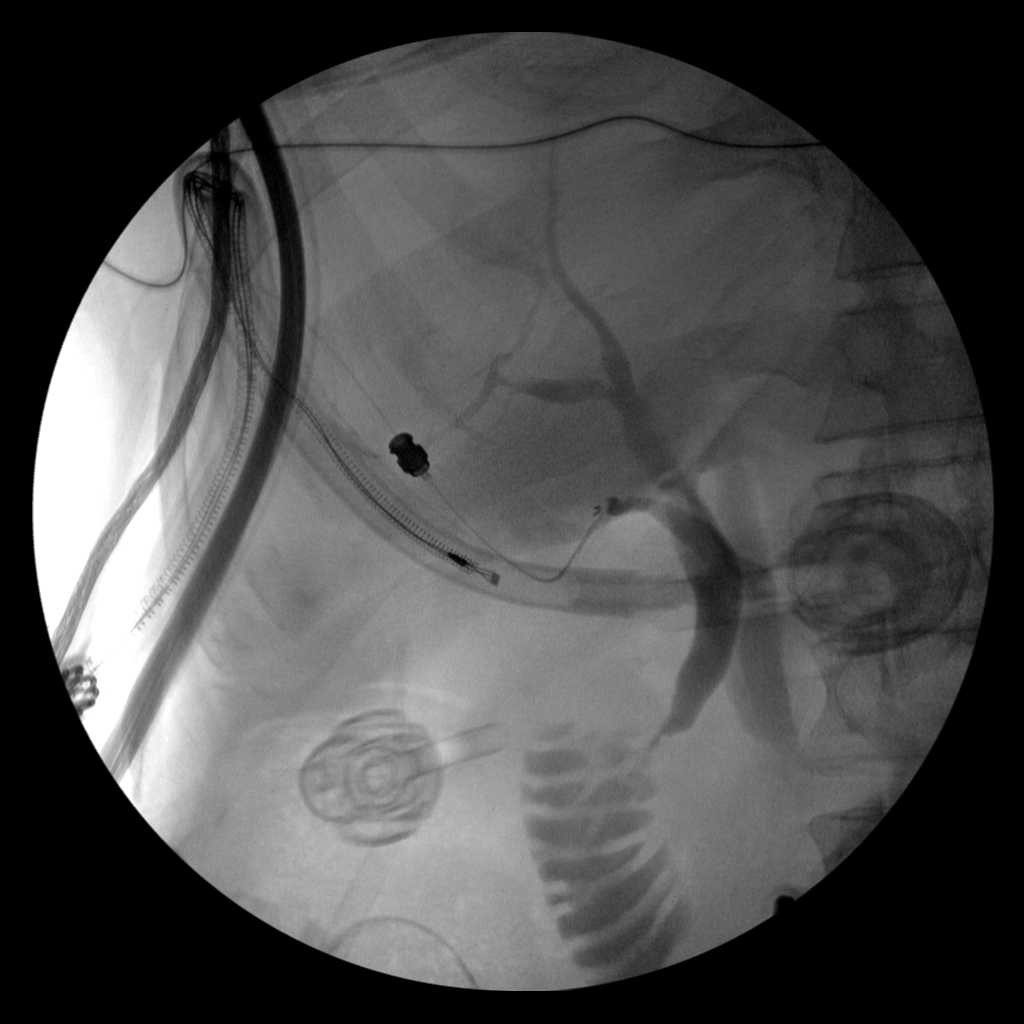
[frame 106/117]
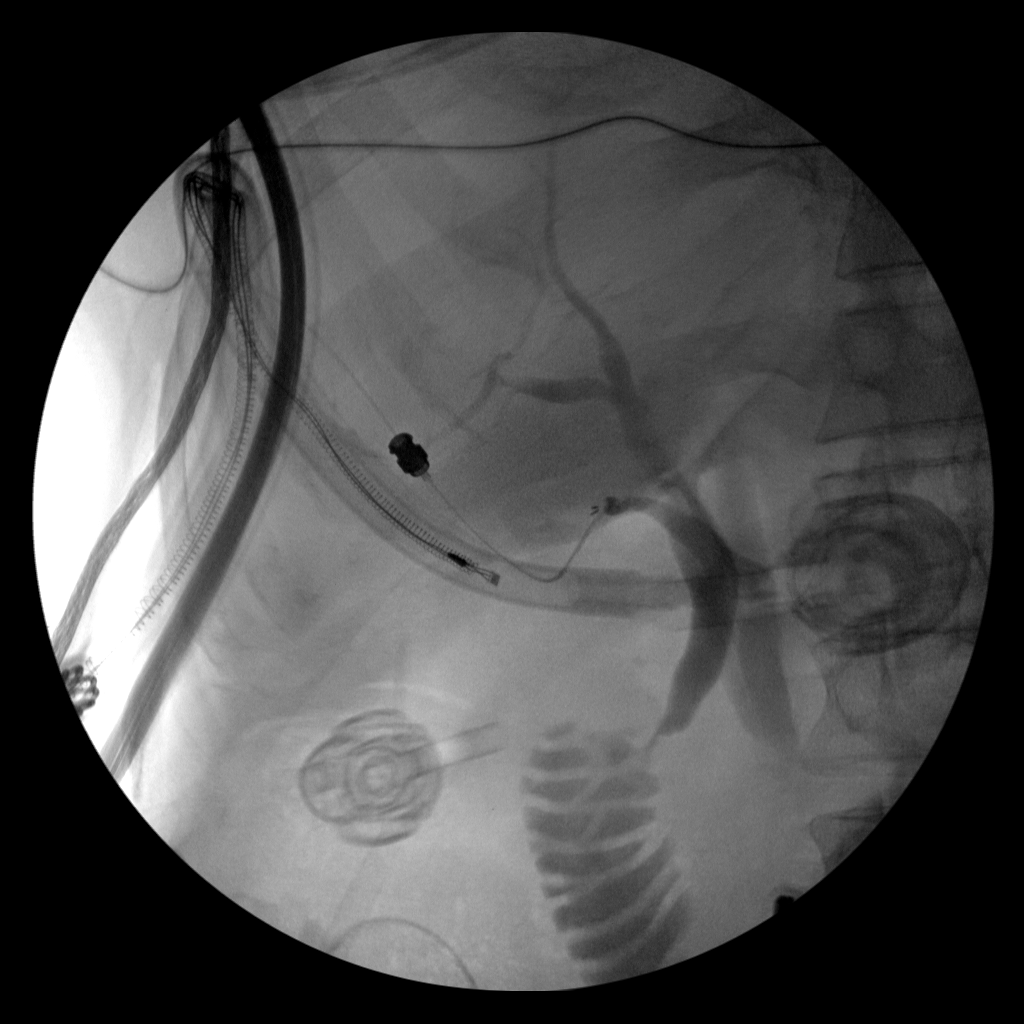

[4 of 4 positions shown; findings below may reference images not displayed]

FINDINGS: Intraoperative cholangiogram images obtained with a C-arm
demonstrate no evidence of biliary obstruction or filling defect.
Right lobe intrahepatic ducts that are opacified appear mildly
irregular in caliber. This is nonspecific. Contrast enters the
duodenum normally.
IMPRESSION: No filling defects or evidence of biliary obstruction. The opacified
intrahepatic ducts in the right lobe appear mildly irregular in
caliber. This is nonspecific.

## 2018-08-06 DIAGNOSIS — M5416 Radiculopathy, lumbar region: Secondary | ICD-10-CM | POA: Diagnosis not present

## 2018-08-06 DIAGNOSIS — M5136 Other intervertebral disc degeneration, lumbar region: Secondary | ICD-10-CM | POA: Diagnosis not present

## 2018-08-17 DIAGNOSIS — M5416 Radiculopathy, lumbar region: Secondary | ICD-10-CM | POA: Diagnosis not present

## 2018-08-18 DIAGNOSIS — M5416 Radiculopathy, lumbar region: Secondary | ICD-10-CM | POA: Diagnosis not present

## 2018-09-02 HISTORY — PX: HEMI-MICRODISCECTOMY LUMBAR LAMINECTOMY LEVEL 1: SHX5846

## 2018-09-03 DIAGNOSIS — Z01818 Encounter for other preprocedural examination: Secondary | ICD-10-CM | POA: Diagnosis not present

## 2018-09-07 DIAGNOSIS — Z1159 Encounter for screening for other viral diseases: Secondary | ICD-10-CM | POA: Diagnosis not present

## 2018-09-09 DIAGNOSIS — M5127 Other intervertebral disc displacement, lumbosacral region: Secondary | ICD-10-CM | POA: Diagnosis not present

## 2018-09-09 DIAGNOSIS — Z79899 Other long term (current) drug therapy: Secondary | ICD-10-CM | POA: Diagnosis not present

## 2018-09-09 DIAGNOSIS — M5416 Radiculopathy, lumbar region: Secondary | ICD-10-CM | POA: Diagnosis not present

## 2018-09-09 DIAGNOSIS — N3010 Interstitial cystitis (chronic) without hematuria: Secondary | ICD-10-CM | POA: Diagnosis not present

## 2018-09-09 DIAGNOSIS — Z881 Allergy status to other antibiotic agents status: Secondary | ICD-10-CM | POA: Diagnosis not present

## 2018-09-09 DIAGNOSIS — Z981 Arthrodesis status: Secondary | ICD-10-CM | POA: Diagnosis not present

## 2018-09-09 DIAGNOSIS — N400 Enlarged prostate without lower urinary tract symptoms: Secondary | ICD-10-CM | POA: Diagnosis not present

## 2018-09-09 DIAGNOSIS — Z8669 Personal history of other diseases of the nervous system and sense organs: Secondary | ICD-10-CM | POA: Diagnosis not present

## 2018-10-02 ENCOUNTER — Other Ambulatory Visit: Payer: Self-pay

## 2018-10-08 DIAGNOSIS — M545 Low back pain: Secondary | ICD-10-CM | POA: Diagnosis not present

## 2018-10-12 ENCOUNTER — Encounter: Payer: Self-pay | Admitting: Family Medicine

## 2018-10-19 DIAGNOSIS — N5201 Erectile dysfunction due to arterial insufficiency: Secondary | ICD-10-CM | POA: Diagnosis not present

## 2018-10-19 DIAGNOSIS — N401 Enlarged prostate with lower urinary tract symptoms: Secondary | ICD-10-CM | POA: Diagnosis not present

## 2018-10-19 DIAGNOSIS — Z125 Encounter for screening for malignant neoplasm of prostate: Secondary | ICD-10-CM | POA: Diagnosis not present

## 2018-10-19 DIAGNOSIS — N3020 Other chronic cystitis without hematuria: Secondary | ICD-10-CM | POA: Diagnosis not present

## 2018-10-19 DIAGNOSIS — N3281 Overactive bladder: Secondary | ICD-10-CM | POA: Diagnosis not present

## 2018-12-10 ENCOUNTER — Other Ambulatory Visit: Payer: Self-pay

## 2018-12-10 DIAGNOSIS — Z20828 Contact with and (suspected) exposure to other viral communicable diseases: Secondary | ICD-10-CM | POA: Diagnosis not present

## 2018-12-10 DIAGNOSIS — Z20822 Contact with and (suspected) exposure to covid-19: Secondary | ICD-10-CM

## 2018-12-11 LAB — NOVEL CORONAVIRUS, NAA: SARS-CoV-2, NAA: NOT DETECTED

## 2019-01-07 DIAGNOSIS — L578 Other skin changes due to chronic exposure to nonionizing radiation: Secondary | ICD-10-CM | POA: Diagnosis not present

## 2019-01-07 DIAGNOSIS — L281 Prurigo nodularis: Secondary | ICD-10-CM | POA: Diagnosis not present

## 2019-01-07 DIAGNOSIS — L280 Lichen simplex chronicus: Secondary | ICD-10-CM | POA: Diagnosis not present

## 2019-01-11 ENCOUNTER — Ambulatory Visit (INDEPENDENT_AMBULATORY_CARE_PROVIDER_SITE_OTHER): Payer: PPO | Admitting: Family Medicine

## 2019-01-11 ENCOUNTER — Encounter: Payer: Self-pay | Admitting: Family Medicine

## 2019-01-11 ENCOUNTER — Other Ambulatory Visit: Payer: Self-pay

## 2019-01-11 VITALS — BP 122/78 | HR 86 | Temp 97.1°F | Resp 16 | Ht 71.0 in | Wt 203.0 lb

## 2019-01-11 DIAGNOSIS — R739 Hyperglycemia, unspecified: Secondary | ICD-10-CM | POA: Diagnosis not present

## 2019-01-11 DIAGNOSIS — Z0000 Encounter for general adult medical examination without abnormal findings: Secondary | ICD-10-CM | POA: Diagnosis not present

## 2019-01-11 DIAGNOSIS — N138 Other obstructive and reflux uropathy: Secondary | ICD-10-CM | POA: Diagnosis not present

## 2019-01-11 DIAGNOSIS — Z860100 Personal history of colon polyps, unspecified: Secondary | ICD-10-CM

## 2019-01-11 DIAGNOSIS — E781 Pure hyperglyceridemia: Secondary | ICD-10-CM | POA: Diagnosis not present

## 2019-01-11 DIAGNOSIS — N401 Enlarged prostate with lower urinary tract symptoms: Secondary | ICD-10-CM | POA: Diagnosis not present

## 2019-01-11 DIAGNOSIS — Z860101 Personal history of adenomatous and serrated colon polyps: Secondary | ICD-10-CM | POA: Insufficient documentation

## 2019-01-11 NOTE — Progress Notes (Signed)
Patient: Rodney Hall., Male    DOB: 30-May-1945, 73 y.o.   MRN: [id number] Visit Date: 01/11/2019  Today's Provider: Lelon Huh, MD   Chief Complaint  Patient presents with  . Medicare Wellness  . Annual Exam  . Hyperlipidemia   Subjective:     Annual physical Rodney Hall. is a 73 y.o. male. He feels well. He reports exercising 5 times weekly. He reports he is sleeping fairly well.  -----------------------------------------------------------  Lipid/Cholesterol, Follow-up:   Last seen for this 1 years ago (seen by Carmon Ginsberg, PA-C) Management changes since that visit include starting Pravastatin. Patient later decided not to start medication.  . Last Lipid Panel:    Component Value Date/Time   CHOL 181 05/01/2017 0923   TRIG 221 (H) 05/01/2017 0923   HDL 34 (L) 05/01/2017 0923   CHOLHDL 5.3 (H) 05/01/2017 0923   LDLCALC 103 (H) 05/01/2017 [id number]    Risk factors for vascular disease include hypercholesterolemia  He reports good compliance with treatment. He is not having side effects.  Current symptoms include none and have been stable. Weight trend: fluctuating a bit Prior visit with dietician: no Current diet: well balanced Current exercise: cardiovascular workout on exercise equipment  Wt Readings from Last 3 Encounters:  01/11/19 203 lb (92.1 kg)  06/11/18 198 lb (89.8 kg)  12/12/17 188 lb (85.3 kg)    -------------------------------------------------------------------   Review of Systems  Constitutional: Negative for appetite change, chills, fatigue and fever.  HENT: Negative for congestion, ear pain, hearing loss, nosebleeds and trouble swallowing.   Eyes: Negative for pain and visual disturbance.  Respiratory: Negative for cough, chest tightness and shortness of breath.   Cardiovascular: Negative for chest pain, palpitations and leg swelling.  Gastrointestinal: Negative for abdominal pain, blood in stool, constipation,  diarrhea, nausea and vomiting.  Endocrine: Negative for polydipsia, polyphagia and polyuria.  Genitourinary: Negative for dysuria and flank pain.  Musculoskeletal: Positive for back pain. Negative for arthralgias, joint swelling, myalgias and neck stiffness.  Skin: Negative for color change, rash and wound.  Neurological: Negative for dizziness, tremors, seizures, speech difficulty, weakness, light-headedness and headaches.  Psychiatric/Behavioral: Negative for behavioral problems, confusion, decreased concentration, dysphoric mood and sleep disturbance. The patient is not nervous/anxious.   All other systems reviewed and are negative.   Social History   Socioeconomic History  . Marital status: Married    Spouse name: Not on file  . Number of children: Not on file  . Years of education: Not on file  . Highest education level: Not on file  Occupational History  . Not on file  Social Needs  . Financial resource strain: Not on file  . Food insecurity    Worry: Not on file    Inability: Not on file  . Transportation needs    Medical: Not on file    Non-medical: Not on file  Tobacco Use  . Smoking status: Never Smoker  . Smokeless tobacco: Never Used  Substance and Sexual Activity  . Alcohol use: No    Alcohol/week: 0.0 standard drinks  . Drug use: No  . Sexual activity: Yes  Lifestyle  . Physical activity    Days per week: Not on file    Minutes per session: Not on file  . Stress: Not on file  Relationships  . Social Herbalist on phone: Not on file    Gets together: Not on file    Attends religious  service: Not on file    Active member of club or organization: Not on file    Attends meetings of clubs or organizations: Not on file    Relationship status: Not on file  . Intimate partner violence    Fear of current or ex partner: Not on file    Emotionally abused: Not on file    Physically abused: Not on file    Forced sexual activity: Not on file  Other  Topics Concern  . Not on file  Social History Narrative  . Not on file    Past Medical History:  Diagnosis Date  . Abdominal mass   . Abdominal pain 06/02/2017  . Anxiety    Pt. denies  . Arthritis    pt. denies  . BPH with obstruction/lower urinary tract symptoms   . History of actinic keratoses   . Hyperglycemia   . Interstitial cystitis   . Pancreatic cyst   . Pneumonia    27 years ago     Patient Active Problem List   Diagnosis Date Noted  . Abdominal mass   . Impingement syndrome of shoulder region 06/30/2017  . Cholecystitis 06/02/2017  . Choledocholithiasis with acute cholecystitis   . Upper abdominal pain   . Elevated LFTs   . Pancreatic cyst   . 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 Interstitial cystitis 04/06/2015    Past Surgical History:  Procedure Laterality Date  . ANTERIOR LAT LUMBAR FUSION Right 07/30/2016   Procedure: Right Lateral two-three Lateral transpsoas interbody fusion with lateral plating/Minimally invasive decompression at Lumbar two-three;  Surgeon: Ditty, Kevan Ny, MD;  Location: [place];  Service: Neurosurgery;  Laterality: Right;  . BACK SURGERY  2012  . CATARACT EXTRACTION, BILATERAL    . CHOLECYSTECTOMY N/A 07/25/2017   Procedure: LAPAROSCOPIC CHOLECYSTECTOMY WITH INTRAOPERATIVE CHOLANGIOGRAM;  Surgeon: Johnathan Hausen, MD;  Location: [facility];  Service: General;  Laterality: N/A;  . COLONOSCOPY WITH PROPOFOL N/A 12/12/2017   Procedure: COLONOSCOPY WITH PROPOFOL;  Surgeon: Manya Silvas, MD;  Location[facility] ENDOSCOPY;  Service: Endoscopy;  Laterality: N/A;  . EUS N/A 07/10/2017   Procedure: UPPER ENDOSCOPIC ULTRASOUND (EUS) LINEAR;  Surgeon: [name], MD;  Location: [facility] ENDOSCOPY;  Service: Endoscopy;  Laterality: N/A;  . EYE SURGERY    . HEMI-MICRODISCECTOMY LUMBAR LAMINECTOMY LEVEL 1  09/2018   [employer]  . NECK SURGERY  2000  . TENNIS ELBOW RELEASE/NIRSCHEL PROCEDURE    .  TONSILLECTOMY AND ADENOIDECTOMY      His family history includes Cancer in his mother.   Current Outpatient Medications:  .  acetaminophen (TYLENOL) 325 MG tablet, Take 650 mg by mouth every 6 (six) hours as needed for moderate pain or headache., Disp: , Rfl:  .  hydrOXYzine (ATARAX/VISTARIL) 50 MG tablet, Take 50 mg by mouth 3 (three) times daily as needed., Disp: , Rfl:  .  imipramine (TOFRANIL) 50 MG tablet, Take 50 mg by mouth at bedtime. , Disp: , Rfl:  .  pentosan polysulfate (ELMIRON) 100 MG capsule, Take 100 mg by mouth 3 (three) times daily. , Disp: , Rfl:  .  sodium chloride (OCEAN) 0.65 % SOLN nasal spray, Place 1 spray into both nostrils as needed for congestion., Disp: , Rfl:  .  tamsulosin (FLOMAX) 0.4 MG CAPS capsule, Take 0.4 mg by mouth daily after supper. , Disp: , Rfl:   Patient Care Team: Birdie Sons, MD as PCP - General (Family Medicine) Royston Cowper, MD  as Consulting Physician (Urology)    Objective:    Vitals: BP 122/78 (BP Location: Left Arm, Patient Position: Sitting, Cuff Size: Normal)   Pulse 86   Temp (!) 97.1 F (36.2 C) (Temporal)   Resp 16   Ht 5\' 11"  (1.803 m)   Wt 203 lb (92.1 kg)   SpO2 99% Comment: room air  BMI 28.31 kg/m   Physical Exam  General Appearance:    Well developed, well nourished male. Alert, cooperative, in no acute distress, appears stated age  Head:    Normocephalic, without obvious abnormality, atraumatic  Eyes:    PERRL, conjunctiva/corneas clear, EOM's intact, fundi    benign, both eyes       Ears:    Normal TM's and external ear canals, both ears  Nose:   Nares normal, septum midline, mucosa normal, no drainage   or sinus tenderness  Throat:   Lips, mucosa, and tongue normal; teeth and gums normal  Neck:   Supple, symmetrical, trachea midline, no adenopathy;       thyroid:  No enlargement/tenderness/nodules; no carotid   bruit or JVD  Back:     Symmetric, no curvature, ROM normal, no CVA tenderness  Lungs:      Clear to auscultation bilaterally, respirations unlabored  Chest wall:    No tenderness or deformity  Heart:    Normal heart rate. Normal rhythm. No murmurs, rubs, or gallops.  S1 and S2 normal  Abdomen:     Soft, non-tender, bowel sounds active all four quadrants,    no masses, no organomegaly  Genitalia:    deferred  Rectal:    deferred  Extremities:   All extremities are intact. No cyanosis or edema  Pulses:   2+ and symmetric all extremities  Skin:   Skin color, texture, turgor normal, no rashes or lesions. Well healed surgical scars of lap cholecystectomy and lumbar spine  Lymph nodes:   Cervical, supraclavicular, and axillary nodes normal  Neurologic:   CNII-XII intact. Normal strength, sensation and reflexes      throughout       Assessment & Plan:     1. Annual physical exam Unremarkable exam.  - Basic metabolic panel - Lipid panel (Fasting)  2. Hyperglycemia  - HgB A1c  3. Hypertriglyceridemia Counseled on elevated risk of cardiac disease. He doesn't mind taking low dose ECASA. He would consider statin after reviewing labs.   4. Benign prostatic hyperplasia with urinary obstruction He reports he has already had PSA checked by his urologist   5. History of colon polyps Had colonoscopy last year per Dr. Tiffany Kocher. Will search for path report from excised polyp and recommended follow up   .  The entirety of the information documented in the History of Present Illness, Review of Systems and Physical Exam were personally obtained by me. Portions of this information were initially documented by Meyer Cory, CMA and reviewed by me for thoroughness and accuracy.     [facility]

## 2019-01-11 NOTE — Patient Instructions (Addendum)
.   Please review the attached list of medications and notify my office if there are any errors.   . Please bring all of your medications to every appointment so we can make sure that our medication list is the same as yours.    The CDC recommends two doses of Shingrix (the shingles vaccine) separated by 2 to 6 months for adults age 73 years and older. I recommend checking with your insurance plan regarding coverage for this vaccine.   . Please go to the lab draw station in [address] on the second floor of [facility]  when you are fasting for 8 hours. Normal hours are 8:00am to 12:30pm and 1:30pm to 4:00pm Monday through Friday    

## 2019-01-11 NOTE — Progress Notes (Signed)
Patient: Rodney Trembath., Male    DOB: [birth year], 73 y.o.   MRN: [id number] Visit Date: 01/11/2019  Today's Provider: Lelon Huh, MD   Chief Complaint  Patient presents with  . Medicare Wellness   Subjective:     Annual wellness visit Rodney Passon. is a 73 y.o. male. He feels well. He reports exercising several days a week on stationary bike. He reports he is sleeping fairly well.  -----------------------------------------------------------     Social History   Socioeconomic History  . Marital status: Married    Spouse name: Not on file  . Number of children: Not on file  . Years of education: Not on file  . Highest education level: Not on file  Occupational History  . Not on file  Social Needs  . Financial resource strain: Not on file  . Food insecurity    Worry: Not on file    Inability: Not on file  . Transportation needs    Medical: Not on file    Non-medical: Not on file  Tobacco Use  . Smoking status: Never Smoker  . Smokeless tobacco: Never Used  Substance and Sexual Activity  . Alcohol use: No    Alcohol/week: 0.0 standard drinks  . Drug use: No  . Sexual activity: Yes  Lifestyle  . Physical activity    Days per week: Not on file    Minutes per session: Not on file  . Stress: Not on file  Relationships  . Social Herbalist on phone: Not on file    Gets together: Not on file    Attends religious service: Not on file    Active member of club or organization: Not on file    Attends meetings of clubs or organizations: Not on file    Relationship status: Not on file  . Intimate partner violence    Fear of current or ex partner: Not on file    Emotionally abused: Not on file    Physically abused: Not on file    Forced sexual activity: Not on file  Other Topics Concern  . Not on file  Social History Narrative  . Not on file    Past Medical History:  Diagnosis Date  . Abdominal mass   . Abdominal pain 06/02/2017   . Anxiety    Pt. denies  . Arthritis    pt. denies  . BPH with obstruction/lower urinary tract symptoms   . History of actinic keratoses   . Hyperglycemia   . Interstitial cystitis   . Pancreatic cyst   . Pneumonia    27 years ago     Patient Active Problem List   Diagnosis Date Noted  . Hypertriglyceridemia 01/11/2019  . History of colon polyps 01/11/2019  . Abdominal mass   . Impingement syndrome of shoulder region 06/30/2017  . Cholecystitis 06/02/2017  . Choledocholithiasis with acute cholecystitis   . Upper abdominal pain   . Elevated LFTs   . Pancreatic cyst   . 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 Hyperglycemia 04/06/2015  . Interstitial cystitis 04/06/2015    Past Surgical History:  Procedure Laterality Date  . ANTERIOR LAT LUMBAR FUSION Right 07/30/2016   Procedure: Right Lateral two-three Lateral transpsoas interbody fusion with lateral plating/Minimally invasive decompression at Lumbar two-three;  Surgeon: Ditty, Kevan Ny, MD;  Location: [place];  Service: Neurosurgery;  Laterality: Right;  . BACK SURGERY  2012  . CATARACT  EXTRACTION, BILATERAL    . CHOLECYSTECTOMY N/A 07/25/2017   Procedure: LAPAROSCOPIC CHOLECYSTECTOMY WITH INTRAOPERATIVE CHOLANGIOGRAM;  Surgeon: Johnathan Hausen, MD;  Location: [facility];  Service: General;  Laterality: N/A;  . COLONOSCOPY WITH PROPOFOL N/A 12/12/2017   Procedure: COLONOSCOPY WITH PROPOFOL;  Surgeon: Manya Silvas, MD;  Location: [facility];  Service: Endoscopy;  Laterality: N/A;  . EUS N/A 07/10/2017   Procedure: UPPER ENDOSCOPIC ULTRASOUND (EUS) LINEAR;  Surgeon: [name], MD;  Location: [facility];  Service: Endoscopy;  Laterality: N/A;  . EYE SURGERY    . HEMI-MICRODISCECTOMY LUMBAR LAMINECTOMY LEVEL 1  09/2018   [employer]  . NECK SURGERY  2000  . TENNIS ELBOW RELEASE/NIRSCHEL PROCEDURE    . TONSILLECTOMY AND ADENOIDECTOMY      His family history includes  Cancer in his mother; Diabetes in his brother and father; Heart disease in his sister; Rheumatic fever in his sister.   Current Outpatient Medications:  .  acetaminophen (TYLENOL) 325 MG tablet, Take 650 mg by mouth every 6 (six) hours as needed for moderate pain or headache., Disp: , Rfl:  .  hydrOXYzine (ATARAX/VISTARIL) 50 MG tablet, Take 50 mg by mouth 3 (three) times daily as needed., Disp: , Rfl:  .  imipramine (TOFRANIL) 50 MG tablet, Take 50 mg by mouth at bedtime. , Disp: , Rfl:  .  naproxen sodium (ALEVE) 220 MG tablet, Take 440 mg by mouth daily., Disp: , Rfl:  .  pentosan polysulfate (ELMIRON) 100 MG capsule, Take 100 mg by mouth 3 (three) times daily. , Disp: , Rfl:  .  sodium chloride (OCEAN) 0.65 % SOLN nasal spray, Place 1 spray into both nostrils as needed for congestion., Disp: , Rfl:  .  tamsulosin (FLOMAX) 0.4 MG CAPS capsule, Take 0.4 mg by mouth daily after supper. , Disp: , Rfl:   Patient Care Team: Birdie Sons, MD as PCP - General (Family Medicine) Royston Cowper, MD as Consulting Physician (Urology)    Objective:    Vitals: BP 122/78 (BP Location: Left Arm, Patient Position: Sitting, Cuff Size: Normal)   Pulse 86   Temp (!) 97.1 F (36.2 C) (Temporal)   Resp 16   Ht 5\' 11"  (1.803 m)   Wt 203 lb (92.1 kg)   SpO2 99% Comment: room air  BMI 28.31 kg/m    Activities of Daily Living In your present state of health, do you have any difficulty performing the following activities: 01/11/2019  Hearing? Y  Vision? N  Difficulty concentrating or making decisions? N  Walking or climbing stairs? N  Dressing or bathing? N  Doing errands, shopping? N  Some recent data might be hidden    Fall Risk Assessment Fall Risk  01/11/2019 05/01/2017 04/22/2016 04/06/2015  Falls in the past year? 0 No No Yes  Number falls in past yr: 0 - - 1  Injury with Fall? 0 - - Yes  Follow up Falls evaluation completed - - Follow up appointment     Depression Screen PHQ 2/9  Scores 01/11/2019 05/01/2017 04/22/2016 04/06/2015  PHQ - 2 Score 0 0 0 0  PHQ- 9 Score 0 - - -    No flowsheet data found.    Assessment & Plan:     Annual Wellness Visit  Reviewed patient's Family Medical History Reviewed and updated list of patient's medical providers Assessment of cognitive impairment was done Assessed patient's functional ability Established a written schedule for health screening Rodney Hall Completed and  Reviewed  Exercise Activities and Dietary recommendations Goals   None     Immunization History  Administered Date(s) Administered  . Fluad Quad(high Dose 65+) 11/30/2018  . Influenza, High Dose Seasonal PF 12/26/2016, 12/23/2017  . Influenza-Unspecified 12/03/2014  . Pneumococcal Conjugate-13 03/09/2014  . Pneumococcal Polysaccharide-23 04/06/2015  . Tdap 04/06/2015  . Zoster 03/05/2011    Health Maintenance  Topic Date Due  . INFLUENZA VACCINE  10/03/2018  . TETANUS/TDAP  04/05/2025  . COLONOSCOPY  12/13/2027  . Hepatitis C Screening  Completed  . PNA vac Low Risk Adult  Completed     Discussed health benefits of physical activity, and encouraged him to engage in regular exercise appropriate for his age and condition.    ------------------------------------------------------------------------------------------------------------    Lelon Huh, MD  [place]

## 2019-01-13 DIAGNOSIS — Z0000 Encounter for general adult medical examination without abnormal findings: Secondary | ICD-10-CM | POA: Diagnosis not present

## 2019-01-13 DIAGNOSIS — R739 Hyperglycemia, unspecified: Secondary | ICD-10-CM | POA: Diagnosis not present

## 2019-01-14 ENCOUNTER — Telehealth: Payer: Self-pay

## 2019-01-14 LAB — BASIC METABOLIC PANEL WITH GFR
BUN/Creatinine Ratio: 9 — ABNORMAL LOW (ref 10–24)
BUN: 12 mg/dL (ref 8–27)
CO2: 22 mmol/L (ref 20–29)
Calcium: 9.5 mg/dL (ref 8.6–10.2)
Chloride: 105 mmol/L (ref 96–106)
Creatinine, Ser: 1.38 mg/dL — ABNORMAL HIGH (ref 0.76–1.27)
GFR calc Af Amer: 58 mL/min/{1.73_m2} — ABNORMAL LOW
GFR calc non Af Amer: 50 mL/min/{1.73_m2} — ABNORMAL LOW
Glucose: 103 mg/dL — ABNORMAL HIGH (ref 65–99)
Potassium: 4.6 mmol/L (ref 3.5–5.2)
Sodium: 140 mmol/L (ref 134–144)

## 2019-01-14 LAB — LIPID PANEL
Chol/HDL Ratio: 5.4 ratio — ABNORMAL HIGH (ref 0.0–5.0)
Cholesterol, Total: 195 mg/dL (ref 100–199)
HDL: 36 mg/dL — ABNORMAL LOW
LDL Chol Calc (NIH): 116 mg/dL — ABNORMAL HIGH (ref 0–99)
Triglycerides: 248 mg/dL — ABNORMAL HIGH (ref 0–149)
VLDL Cholesterol Cal: 43 mg/dL — ABNORMAL HIGH (ref 5–40)

## 2019-01-14 LAB — HEMOGLOBIN A1C
Est. average glucose Bld gHb Est-mCnc: 128 mg/dL
Hgb A1c MFr Bld: 6.1 % — ABNORMAL HIGH (ref 4.8–5.6)

## 2019-01-14 MED ORDER — PRAVASTATIN SODIUM 40 MG PO TABS: 40.0000 mg | ORAL_TABLET | Freq: Every day | ORAL | 3 refills | Status: DC

## 2019-01-14 NOTE — Telephone Encounter (Signed)
Patient was advised of lab results and in agreeance to start medication. KW

## 2019-01-14 NOTE — Telephone Encounter (Signed)
-----  Message from Donald E Fisher, MD sent at 01/14/2019  8:03 AM EST ----- Average blood sugar is 6.1 which is borderline for diabetes. Need to cut back on sweets and starchy foods as much as possible. Triglycerides are high at 248 which is another contributor to heart disease.  Due to increased risk for developing heart disease he should start pravastatin 40mg once a day, #30, rf x 3 and OTC Coenzyme Q10 100mg daily.  Check lipids, A1c and met c in 2-3 months. 

## 2019-02-11 DIAGNOSIS — L821 Other seborrheic keratosis: Secondary | ICD-10-CM | POA: Diagnosis not present

## 2019-02-11 DIAGNOSIS — L820 Inflamed seborrheic keratosis: Secondary | ICD-10-CM | POA: Diagnosis not present

## 2019-02-11 DIAGNOSIS — Z872 Personal history of diseases of the skin and subcutaneous tissue: Secondary | ICD-10-CM | POA: Diagnosis not present

## 2019-06-29 ENCOUNTER — Encounter: Payer: Self-pay | Admitting: Family Medicine

## 2019-06-29 ENCOUNTER — Other Ambulatory Visit: Payer: Self-pay

## 2019-06-29 ENCOUNTER — Ambulatory Visit (INDEPENDENT_AMBULATORY_CARE_PROVIDER_SITE_OTHER): Payer: PPO | Admitting: Family Medicine

## 2019-06-29 DIAGNOSIS — H6980 Other specified disorders of Eustachian tube, unspecified ear: Secondary | ICD-10-CM | POA: Insufficient documentation

## 2019-06-29 DIAGNOSIS — J0100 Acute maxillary sinusitis, unspecified: Secondary | ICD-10-CM | POA: Diagnosis not present

## 2019-06-29 DIAGNOSIS — H6990 Unspecified Eustachian tube disorder, unspecified ear: Secondary | ICD-10-CM | POA: Insufficient documentation

## 2019-06-29 DIAGNOSIS — H6992 Unspecified Eustachian tube disorder, left ear: Secondary | ICD-10-CM

## 2019-06-29 DIAGNOSIS — H6982 Other specified disorders of Eustachian tube, left ear: Secondary | ICD-10-CM | POA: Diagnosis not present

## 2019-06-29 MED ORDER — DOXYCYCLINE HYCLATE 100 MG PO TABS: 100.0000 mg | ORAL_TABLET | Freq: Two times a day (BID) | ORAL | 0 refills | Status: DC

## 2019-06-29 NOTE — Patient Instructions (Signed)
How to Perform a Sinus Rinse A sinus rinse is a home treatment. It rinses your sinuses with a mixture of salt and water (saline solution). Sinuses are air-filled spaces in your skull behind the bones of your face and forehead. They open into your nasal cavity. A sinus rinse can help to clear your nasal cavity. It can clear mucus, dirt, dust, or pollen. You may do a sinus rinse when you have:  A cold.  A virus.  Allergies.  A sinus infection.  A stuffy nose. Talk with your doctor about whether a sinus rinse might help you. What are the risks? A sinus rinse is normally very safe and helpful. However, there are a few risks. These include:  A burning feeling in the sinuses. This may happen if you do not make the saline solution as instructed. Be sure to follow all directions when making the saline solution.  Nasal irritation.  Infection from unclean water. This is rare, but possible. Do not do a sinus rinse if you have had:  Ear or nasal surgery.  An ear infection.  Blocked ears. Supplies needed:  Saline solution or powder.  Distilled or germ-free (sterile) water may be needed to mix with saline powder. ? You may use boiled and cooled tap water. Boil tap water for 5 minutes; cool until it is lukewarm. Use within 24 hours. ? Do not use regular tap water to mix with the saline solution.  Neti pot or nasal rinse bottle. This releases the saline solution into your nose and through your sinuses. You can buy neti pots and rinse bottles: ? At your local pharmacy. ? At a health food store. ? Online. How to perform a sinus rinse  1. Wash your hands with soap and water. 2. Wash your device using the directions that came with it. 3. Dry your device. 4. Use the solution that comes with your device or one that is sold separately in stores. Follow the mixing directions on the package if you need to mix with sterile or distilled water. 5. Fill your device with the amount of saline  solution stated in the device instructions. 6. Stand over a sink and tilt your head sideways over the sink. 7. Place the spout of the device in your upper nostril (the one closer to the ceiling). 8. Gently pour or squeeze the saline solution into your nasal cavity. The liquid should drain to your lower nostril if you are not too stuffed up (congested). 9. While rinsing, breathe through your open mouth. 10. Gently blow your nose to clear any mucus and rinse solution. Blowing too hard may cause ear pain. 11. Repeat in your other nostril. 12. Clean and rinse your device with clean water. 13. Air-dry your device. Talk with your doctor or pharmacist if you have questions about how to do a sinus rinse. Summary  A sinus rinse is a home treatment. It rinses your sinuses with a mixture of salt and water (saline solution).  A sinus rinse is normally very safe and helpful. Follow all instructions carefully.  Talk with your doctor about whether a sinus rinse might help you. This information is not intended to replace advice given to you by your health care provider. Make sure you discuss any questions you have with your health care provider. Document Revised: 12/16/2016 Document Reviewed: 12/16/2016 Elsevier Patient Education  2020 Elsevier Inc.  

## 2019-06-29 NOTE — Assessment & Plan Note (Signed)
Recommend steroid nasal spray.  Patient has Flonase, advised to use daily.  Sinus irrigation recommended.

## 2019-06-29 NOTE — Assessment & Plan Note (Signed)
Treat with Doxycycline

## 2019-06-29 NOTE — Progress Notes (Signed)
Established patient visit  I,Rodney Hall,acting as a scribe for [employer], [employer].,have documented all relevant documentation on the behalf of Rodney Murders, PA,as directed by  [employer], [employer] while in the presence of [employer], [place].   Patient: Rodney Hall.   DOB: [birth year]   74 y.o. Male  MRN: [id number] Visit Date: 06/29/2019  Today's healthcare provider: [name], PA   Chief Complaint  Patient presents with  . Ear Pain   Subjective    HPI  The patient is a 64 year ld male who presents with complaint of left ear pain.  He states that it has been bothering him for a few months.  However for the last few days it has been hurting him with it waking him up last night.  He has been taking a decongestant and antihistamine.  He also complains of some cough and congestion as well.   Patient Active Problem List   Diagnosis Date Noted  . Sinusitis, acute maxillary 06/29/2019  . Eustachian tube dysfunction 06/29/2019  . Hypertriglyceridemia 01/11/2019  . History of colon polyps 01/11/2019  . Abdominal mass   . Impingement syndrome of shoulder region 06/30/2017  . Cholecystitis 06/02/2017  . Choledocholithiasis with acute cholecystitis   . Upper abdominal pain   . Elevated LFTs   . Pancreatic cyst   . 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 Hyperglycemia 04/06/2015  . Interstitial cystitis 04/06/2015   Past Medical History:  Diagnosis Date  . Abdominal mass   . Abdominal pain 06/02/2017  . Anxiety    Pt. denies  . Arthritis    pt. denies  . BPH with obstruction/lower urinary tract symptoms   . History of actinic keratoses   . Hyperglycemia   . Interstitial cystitis   . Pancreatic cyst   . Pneumonia    27 years ago   Past Surgical History:  Procedure Laterality Date  . ANTERIOR LAT LUMBAR FUSION Right 07/30/2016   Procedure: Right Lateral two-three Lateral transpsoas interbody fusion with lateral  plating/Minimally invasive decompression at Lumbar two-three;  Surgeon: Ditty, Kevan Ny, MD;  Location: [place];  Service: Neurosurgery;  Laterality: Right;  . BACK SURGERY  2012  . CATARACT EXTRACTION, BILATERAL    . CHOLECYSTECTOMY N/A 07/25/2017   Procedure: LAPAROSCOPIC CHOLECYSTECTOMY WITH INTRAOPERATIVE CHOLANGIOGRAM;  Surgeon: Johnathan Hausen, MD;  [facility];  Service: General;  Laterality: N/A;  . COLONOSCOPY WITH PROPOFOL N/A 12/12/2017   Procedure: COLONOSCOPY WITH PROPOFOL;  Surgeon: Manya Silvas, MD;  Location[facility];  Service: Endoscopy;  Laterality: N/A;  . EUS N/A 07/10/2017   Procedure: UPPER ENDOSCOPIC ULTRASOUND (EUS) LINEAR;  Surgeon: [name], MD;  [facility];  Service: Endoscopy;  Laterality: N/A;  . EYE SURGERY    . HEMI-MICRODISCECTOMY LUMBAR LAMINECTOMY LEVEL 1  09/2018   [facility] [facility]  . NECK SURGERY  2000  . TENNIS ELBOW RELEASE/NIRSCHEL PROCEDURE    . TONSILLECTOMY AND ADENOIDECTOMY     Social History   Tobacco Use  . Smoking status: Never Smoker  . Smokeless tobacco: Never Used  Substance Use Topics  . Alcohol use: No    Alcohol/week: 0.0 standard drinks  . Drug use: No   Family Status  Relation Name Status  . Mother  Deceased  . Father  Deceased       old age  . Sister  Deceased  . Brother  Alive   Allergies  Allergen Reactions  .  Amoxicillin Rash    ++ got ceftriaxone in the past++ Has patient had a PCN reaction causing immediate rash, facial/tongue/throat swelling, SOB or lightheadedness with hypotension: Yes Has patient had a PCN reaction causing severe rash involving mucus membranes or skin necrosis: No Has patient had a PCN reaction that required hospitalization No Has patient had a PCN reaction occurring within the last 10 years: No If all of the above answers are "NO", then may proceed with Cephalosporin use.         Medications: Outpatient Medications Prior to Visit  Medication  Sig  . acetaminophen (TYLENOL) 325 MG tablet Take 650 mg by mouth every 6 (six) hours as needed for moderate pain or headache.  . hydrOXYzine (ATARAX/VISTARIL) 50 MG tablet Take 50 mg by mouth 3 (three) times daily as needed.  Marland Kitchen imipramine (TOFRANIL) 50 MG tablet Take 50 mg by mouth at bedtime.   . naproxen sodium (ALEVE) 220 MG tablet Take 440 mg by mouth daily.  . pravastatin (PRAVACHOL) 40 MG tablet Take 1 tablet (40 mg total) by mouth daily.  . sodium chloride (OCEAN) 0.65 % SOLN nasal spray Place 1 spray into both nostrils as needed for congestion.  . tamsulosin (FLOMAX) 0.4 MG CAPS capsule Take 0.4 mg by mouth daily after supper.   . pentosan polysulfate (ELMIRON) 100 MG capsule Take 100 mg by mouth 3 (three) times daily.    No facility-administered medications prior to visit.    Review of Systems  Constitutional: Negative for fever.  HENT: Positive for congestion, ear pain, hearing loss, postnasal drip and sinus pressure. Negative for dental problem, ear discharge, rhinorrhea, sinus pain, sneezing, sore throat, tinnitus and trouble swallowing.   Respiratory: Positive for cough. Negative for chest tightness and shortness of breath.   Cardiovascular: Negative for chest pain.  Gastrointestinal: Negative for nausea.  Neurological: Positive for light-headedness.       Objective    BP 132/70 (BP Location: Right Arm, Patient Position: Sitting, Cuff Size: Normal)   Pulse 70   Temp (!) 97.1 F (36.2 C) (Skin)   Wt 204 lb (92.5 kg)   SpO2 97%   BMI 28.45 kg/m    Physical Exam Constitutional:      Appearance: Normal appearance.  HENT:     Head: Normocephalic and atraumatic.     Right Ear: Tympanic membrane, ear canal and external ear normal.     Left Ear: Tympanic membrane, ear canal and external ear normal.     Ears:     Comments: Hazy TM bilaterally    Nose: Nose normal.     Comments: No transillumination of the left maxillary sinus and mild soreness.    Mouth/Throat:      Mouth: Mucous membranes are moist.     Pharynx: Oropharynx is clear.  Eyes:     Extraocular Movements: Extraocular movements intact.     Conjunctiva/sclera: Conjunctivae normal.     Pupils: Pupils are equal, round, and reactive to light.  Cardiovascular:     Rate and Rhythm: Normal rate and regular rhythm.     Pulses: Normal pulses.     Heart sounds: Normal heart sounds.  Pulmonary:     Effort: Pulmonary effort is normal.     Breath sounds: Normal breath sounds. No wheezing.  Musculoskeletal:        General: Normal range of motion.     Cervical back: Normal range of motion and neck supple.  Skin:    General: Skin is warm.  Comments: Skin moist from sweat due to temperature outside  Does report night sweats and hot flashes from medication   Neurological:     Mental Status: He is alert and oriented to person, place, and time.  Psychiatric:        Mood and Affect: Mood normal.        Behavior: Behavior normal.        Thought Content: Thought content normal.        Judgment: Judgment normal.       No results found for any visits on 06/29/19.  Assessment & Plan     Problem List Items Addressed This Visit      Respiratory   Sinusitis, acute maxillary    Treat with Doxycycline 100 mg BID, continue antihistamine and add nasal steroid with saline irrigation.        Relevant Medications   doxycycline (VIBRA-TABS) 100 MG tablet     Nervous and Auditory   Eustachian tube dysfunction    Recommend steroid nasal spray.  Patient has Flonase, advised to use daily.  Sinus irrigation recommended.        Return to clinic if no improvement   No follow-ups on file.         Rodney Hall, [place] [phone] (phone) [phone] (fax)  [place]

## 2019-07-12 ENCOUNTER — Other Ambulatory Visit: Payer: Self-pay

## 2019-07-12 ENCOUNTER — Ambulatory Visit: Payer: PPO | Admitting: Dermatology

## 2019-07-12 DIAGNOSIS — L814 Other melanin hyperpigmentation: Secondary | ICD-10-CM | POA: Diagnosis not present

## 2019-07-12 DIAGNOSIS — L219 Seborrheic dermatitis, unspecified: Secondary | ICD-10-CM | POA: Diagnosis not present

## 2019-07-12 DIAGNOSIS — L821 Other seborrheic keratosis: Secondary | ICD-10-CM | POA: Diagnosis not present

## 2019-07-12 DIAGNOSIS — L578 Other skin changes due to chronic exposure to nonionizing radiation: Secondary | ICD-10-CM | POA: Diagnosis not present

## 2019-07-12 DIAGNOSIS — Z86007 Personal history of in-situ neoplasm of skin: Secondary | ICD-10-CM

## 2019-07-12 DIAGNOSIS — L720 Epidermal cyst: Secondary | ICD-10-CM | POA: Diagnosis not present

## 2019-07-12 DIAGNOSIS — L570 Actinic keratosis: Secondary | ICD-10-CM | POA: Diagnosis not present

## 2019-07-12 DIAGNOSIS — Z1283 Encounter for screening for malignant neoplasm of skin: Secondary | ICD-10-CM | POA: Diagnosis not present

## 2019-07-12 DIAGNOSIS — D229 Melanocytic nevi, unspecified: Secondary | ICD-10-CM | POA: Diagnosis not present

## 2019-07-12 NOTE — Progress Notes (Signed)
   Follow-Up Visit   Subjective  Rodney Hall. is a 74 y.o. male who presents for the following: Annual Exam (6 mos f/u TBSE). The patient presents for skin cancer screening and mole check with total-body skin exam today.  The following portions of the chart were reviewed this encounter and updated as appropriate:  Tobacco  Allergies  Meds  Problems  Med Hx  Surg Hx  Fam Hx     Review of Systems:  No other skin or systemic complaints except as noted in HPI or Assessment and Plan.  Objective  Well appearing patient in no apparent distress; mood and affect are within normal limits.  A full examination was performed including scalp, head, eyes, ears, nose, lips, neck, chest, axillae, abdomen, back, buttocks, bilateral upper extremities, bilateral lower extremities, hands, feet, fingers, toes, fingernails, and toenails. All findings within normal limits unless otherwise noted below.  Objective  face, ears, arms (7): Erythematous thin papules/macules with gritty scale.   Objective  L posterior neck: 0.5 cm Subcutaneous nodule.   Objective  chest: Pinkness and scale   Assessment & Plan   Skin cancer screening performed today.  Actinic Damage - diffuse scaly erythematous macules with underlying dyspigmentation - Recommend daily broad spectrum sunscreen SPF 30+ to sun-exposed areas, reapply every 2 hours as needed.  - Call for new or changing lesions.  Seborrheic Keratoses - Stuck-on, waxy, tan-brown papules and plaques  - Discussed benign etiology and prognosis. - Observe - Call for any changes  Lentigines - Scattered tan macules - Discussed due to sun exposure - Benign, observe - Call for any changes  Melanocytic Nevi - Tan-brown and/or pink-flesh-colored symmetric macules and papules - Benign appearing on exam today - Observation - Call clinic for new or changing moles - Recommend daily use of broad spectrum spf 30+ sunscreen to sun-exposed areas.    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  - Call if any new or changing lesions are noted between office visits  AK (actinic keratosis) (7) face, ears, arms  Destruction of lesion - face, ears, arms Complexity: simple   Destruction method: cryotherapy   Informed consent: discussed and consent obtained   Timeout:  patient name, date of birth, surgical site, and procedure verified Lesion destroyed using liquid nitrogen: Yes   Region frozen until ice ball extended beyond lesion: Yes   Outcome: patient tolerated procedure well with no complications   Post-procedure details: wound care instructions given    Epidermal cyst L posterior neck  observe  Seborrheic dermatitis chest  No cure can only control, this condition may come and go  Discussed treatment options, pt decline he would like to use only otc Hydrocortisone cream prn   Skin cancer screening  Return in about 1 year (around 07/11/2020) for TBSE. IMarye Round, CMA, am acting as scribe for Sarina Ser, MD .  Documentation: I have reviewed the above documentation for accuracy and completeness, and I agree with the above.  Sarina Ser, MD

## 2019-07-12 NOTE — Patient Instructions (Signed)

## 2019-07-27 ENCOUNTER — Encounter: Payer: Self-pay | Admitting: Dermatology

## 2019-08-09 DIAGNOSIS — H43813 Vitreous degeneration, bilateral: Secondary | ICD-10-CM | POA: Diagnosis not present

## 2019-09-28 ENCOUNTER — Other Ambulatory Visit: Payer: Self-pay

## 2019-10-19 ENCOUNTER — Other Ambulatory Visit: Payer: Self-pay

## 2019-10-19 ENCOUNTER — Ambulatory Visit: Payer: PPO | Admitting: Dermatology

## 2019-10-19 DIAGNOSIS — D485 Neoplasm of uncertain behavior of skin: Secondary | ICD-10-CM

## 2019-10-19 DIAGNOSIS — L578 Other skin changes due to chronic exposure to nonionizing radiation: Secondary | ICD-10-CM

## 2019-10-19 DIAGNOSIS — L859 Epidermal thickening, unspecified: Secondary | ICD-10-CM | POA: Diagnosis not present

## 2019-10-19 NOTE — Progress Notes (Signed)
   Follow-Up Visit   Subjective  Rodney Hall. is a 74 y.o. male who presents for the following: lesions (crusted scabbed lesions on the L neck and R pretibial that the patient would like checked).  The following portions of the chart were reviewed this encounter and updated as appropriate:  Tobacco  Allergies  Meds  Problems  Med Hx  Surg Hx  Fam Hx     Review of Systems:  No other skin or systemic complaints except as noted in HPI or Assessment and Plan.  Objective  Well appearing patient in no apparent distress; mood and affect are within normal limits.  A focused examination was performed including the R lower leg and L neck . Relevant physical exam findings are noted in the Assessment and Plan.  Objective  R mid to prox lat pretibial: Keratotic papule 1.3 cm   Objective  L lat neck: 2.2 x 1.5 cm scaly patch with crust within it  Assessment & Plan  Neoplasm of uncertain behavior of skin (2) R mid to prox lat pretibial  Epidermal / dermal shaving  Lesion diameter (cm):  1.3 Informed consent: discussed and consent obtained   Timeout: patient name, date of birth, surgical site, and procedure verified   Procedure prep:  Patient was prepped and draped in usual sterile fashion Prep type:  Isopropyl alcohol Anesthesia: the lesion was anesthetized in a standard fashion   Anesthetic:  1% lidocaine w/ epinephrine 1-100,000 buffered w/ 8.4% NaHCO3 Instrument used: flexible razor blade   Hemostasis achieved with: pressure, aluminum chloride and electrodesiccation   Outcome: patient tolerated procedure well   Post-procedure details: sterile dressing applied and wound care instructions given   Dressing type: bandage and petrolatum    Specimen 1 - Surgical pathology Differential Diagnosis: D48.5 r/o ISK vs SCC vs AK vs other  Check Margins: No Keratotic papule 1.3 cm  L lat neck  Skin / nail biopsy Type of biopsy: tangential   Informed consent: discussed and  consent obtained   Timeout: patient name, date of birth, surgical site, and procedure verified   Procedure prep:  Patient was prepped and draped in usual sterile fashion Prep type:  Isopropyl alcohol Anesthesia: the lesion was anesthetized in a standard fashion   Anesthetic:  1% lidocaine w/ epinephrine 1-100,000 buffered w/ 8.4% NaHCO3 Instrument used: flexible razor blade   Hemostasis achieved with: pressure, aluminum chloride and electrodesiccation   Outcome: patient tolerated procedure well   Post-procedure details: sterile dressing applied and wound care instructions given   Dressing type: bandage and petrolatum    Specimen 2 - Surgical pathology Differential Diagnosis: D48.5 r/o SCC vs AK vs other  Check Margins: No 2.2 x 1.5 cm scaly patch with crust within it  Actinic Damage - diffuse scaly erythematous macules with underlying dyspigmentation - Recommend daily broad spectrum sunscreen SPF 30+ to sun-exposed areas, reapply every 2 hours as needed.  - Call for new or changing lesions.  Return for appointment as scheduled.  Luther Redo, CMA, am acting as scribe for Sarina Ser, MD .  Documentation: I have reviewed the above documentation for accuracy and completeness, and I agree with the above.  Sarina Ser, MD

## 2019-10-19 NOTE — Patient Instructions (Signed)

## 2019-10-25 ENCOUNTER — Telehealth: Payer: Self-pay

## 2019-10-25 NOTE — Telephone Encounter (Signed)
-----   Message from Ralene Bathe, MD sent at 10/25/2019 11:15 AM EDT ----- 1. Skin , right mid to prox lat pretibial EPIDERMAL HYPERPLASIA WITH FIBROSIS, WITH HEMORRHAGIC SCALE CRUST, SEE DESCRIPTION 2. Skin , left lat neck EPIDERMAL HYPERPLASIA WITH ULCERATION, CRUSTED, SEE DESCRIPTION  1&2 - both thickened skin with scabbing Most consistent with area of rubbing or scratching Benign (No cancer) Recheck if not healing - otherwise keep appt May 2022

## 2019-10-25 NOTE — Telephone Encounter (Signed)
Advised patient of results/hd  

## 2019-10-26 ENCOUNTER — Encounter: Payer: Self-pay | Admitting: Dermatology

## 2019-11-16 DIAGNOSIS — N3281 Overactive bladder: Secondary | ICD-10-CM | POA: Diagnosis not present

## 2019-11-16 DIAGNOSIS — N3010 Interstitial cystitis (chronic) without hematuria: Secondary | ICD-10-CM | POA: Diagnosis not present

## 2019-11-16 DIAGNOSIS — N5201 Erectile dysfunction due to arterial insufficiency: Secondary | ICD-10-CM | POA: Diagnosis not present

## 2019-11-16 DIAGNOSIS — Z125 Encounter for screening for malignant neoplasm of prostate: Secondary | ICD-10-CM | POA: Diagnosis not present

## 2019-11-16 DIAGNOSIS — N401 Enlarged prostate with lower urinary tract symptoms: Secondary | ICD-10-CM | POA: Diagnosis not present

## 2019-12-21 DIAGNOSIS — M545 Low back pain, unspecified: Secondary | ICD-10-CM | POA: Diagnosis not present

## 2019-12-21 DIAGNOSIS — M5416 Radiculopathy, lumbar region: Secondary | ICD-10-CM | POA: Diagnosis not present

## 2019-12-21 DIAGNOSIS — M48061 Spinal stenosis, lumbar region without neurogenic claudication: Secondary | ICD-10-CM | POA: Diagnosis not present

## 2020-01-05 DIAGNOSIS — M48061 Spinal stenosis, lumbar region without neurogenic claudication: Secondary | ICD-10-CM | POA: Diagnosis not present

## 2020-01-10 DIAGNOSIS — M5416 Radiculopathy, lumbar region: Secondary | ICD-10-CM | POA: Diagnosis not present

## 2020-01-17 DIAGNOSIS — M5416 Radiculopathy, lumbar region: Secondary | ICD-10-CM | POA: Diagnosis not present

## 2020-01-19 ENCOUNTER — Telehealth: Payer: Self-pay | Admitting: Family Medicine

## 2020-01-19 NOTE — Telephone Encounter (Signed)
Copied from [place] [phone]. Topic: Medicare AWV >> Jan 19, 2020 11:45 AM Cher Nakai R wrote: Reason for CRM:  Left message for patient to call back and schedule Medicare Annual Wellness Visit (AWV) in office.   If not able to come in office, please offer to do virtually.   Last AWV 01/11/2019  Please schedule at anytime with [facility] Health Advisor.  If any questions, please contact me at [phone]

## 2020-02-14 DIAGNOSIS — M5416 Radiculopathy, lumbar region: Secondary | ICD-10-CM | POA: Diagnosis not present

## 2020-03-13 DIAGNOSIS — M5459 Other low back pain: Secondary | ICD-10-CM | POA: Diagnosis not present

## 2020-03-13 DIAGNOSIS — M5136 Other intervertebral disc degeneration, lumbar region: Secondary | ICD-10-CM | POA: Diagnosis not present

## 2020-03-13 DIAGNOSIS — Z981 Arthrodesis status: Secondary | ICD-10-CM | POA: Insufficient documentation

## 2020-03-31 ENCOUNTER — Telehealth: Payer: Self-pay | Admitting: Family Medicine

## 2020-03-31 NOTE — Telephone Encounter (Signed)
Copied from [place] [phone]. Topic: Medicare AWV >> Mar 31, 2020 11:28 AM Cher Nakai R wrote: Reason for CRM:   Left message for patient to call back and schedule Medicare Annual Wellness Visit (AWV) in office.   If not able to come in office, please offer to do virtually or by telephone.   Last AWV 01/11/2019  Please schedule at anytime with [facility] Health Advisor.  If any questions, please contact me at [phone]

## 2020-04-06 ENCOUNTER — Encounter: Payer: PPO | Admitting: Family Medicine

## 2020-05-26 ENCOUNTER — Encounter: Payer: Self-pay | Admitting: Family Medicine

## 2020-05-26 ENCOUNTER — Ambulatory Visit (INDEPENDENT_AMBULATORY_CARE_PROVIDER_SITE_OTHER): Payer: PPO | Admitting: Family Medicine

## 2020-05-26 ENCOUNTER — Other Ambulatory Visit: Payer: Self-pay

## 2020-05-26 VITALS — BP 100/64 | HR 97 | Temp 98.2°F | Resp 18 | Ht 71.0 in | Wt 205.2 lb

## 2020-05-26 DIAGNOSIS — Z0000 Encounter for general adult medical examination without abnormal findings: Secondary | ICD-10-CM | POA: Diagnosis not present

## 2020-05-26 DIAGNOSIS — R739 Hyperglycemia, unspecified: Secondary | ICD-10-CM | POA: Diagnosis not present

## 2020-05-26 DIAGNOSIS — Z125 Encounter for screening for malignant neoplasm of prostate: Secondary | ICD-10-CM

## 2020-05-26 DIAGNOSIS — E781 Pure hyperglyceridemia: Secondary | ICD-10-CM | POA: Diagnosis not present

## 2020-05-26 NOTE — Progress Notes (Addendum)
Complete Physical Exam     Patient: Rodney Hall., Male    DOB: [birth year], 75 y.o.   MRN: [id number] Visit Date: 05/26/2020  Today's Provider: Lelon Huh, MD   Chief Complaint  Patient presents with  . Medicare Wellness  . Hyperlipidemia  . Hyperglycemia   Subjective    Rodney Hall. is a 75 y.o. male who presents today for his Annual Wellness Visit. He reports consuming a general diet. Gym/ health club routine includes cardio. He generally feels fairly well. He reports sleeping fairly well. He does not have additional problems to discuss today.   HPI Lipid/Cholesterol, Follow-up  Last lipid panel Other pertinent labs  Lab Results  Component Value Date   CHOL 195 01/13/2019   HDL 36 (L) 01/13/2019   LDLCALC 116 (H) 01/13/2019   TRIG 248 (H) 01/13/2019   CHOLHDL 5.4 (H) 01/13/2019   Lab Results  Component Value Date   ALT 12 (L) 07/02/2017   AST 21 07/02/2017   PLT 345 07/22/2017     He was last seen for this on 01/11/2019.  Management since that visit includes starting pravastatin 40mg  once a day, and OTC Coenzyme Q10 100mg  daily.    He reports poor compliance with treatment. Patient stopped taking medication a few weeks after starting due to it causing him to feel jittery.  He is having side effects.  Symptoms: No chest pain No chest pressure/discomfort  No dyspnea No lower extremity edema  No numbness or tingling of extremity No orthopnea  No palpitations No paroxysmal nocturnal dyspnea  No speech difficulty No syncope   ---------------------------------------------------------------------------------------------------  Hyperglycemia, Follow-up  Lab Results  Component Value Date   HGBA1C 6.1 (H) 01/13/2019   GLUCOSE 103 (H) 01/13/2019   GLUCOSE 130 (H) 07/02/2017   GLUCOSE 115 (H) 06/04/2017    Last seen for for this on 01/11/2019.   Management since that visit includes advising patient to cut back on sweets and starchy foods as  much as possible.  Current symptoms include none and have been stable.  Prior visit with dietician: no Current diet: in general, an "unhealthy" diet Current exercise: cardiovascular workout on exercise equipment  Pertinent Labs:    Component Value Date/Time   CHOL 195 01/13/2019 1022   TRIG 248 (H) 01/13/2019 1022   CHOLHDL 5.4 (H) 01/13/2019 1022   CREATININE 1.38 (H) 01/13/2019 1022    Wt Readings from Last 3 Encounters:  05/26/20 205 lb 3.2 oz (93.1 kg)  06/29/19 204 lb (92.5 kg)  01/11/19 203 lb (92.1 kg)    -----------------------------------------------------------------------------------------     Medications: Outpatient Medications Prior to Visit  Medication Sig  . acetaminophen (TYLENOL) 325 MG tablet Take 650 mg by mouth every 6 (six) hours as needed for moderate pain or headache.  . hydrOXYzine (ATARAX/VISTARIL) 50 MG tablet Take 50 mg by mouth 3 (three) times daily as needed.  Marland Kitchen imipramine (TOFRANIL) 50 MG tablet Take 50 mg by mouth at bedtime.   . naproxen sodium (ALEVE) 220 MG tablet Take 440 mg by mouth daily.  . pentosan polysulfate (ELMIRON) 100 MG capsule Take 100 mg by mouth 3 (three) times daily.   . sodium chloride (OCEAN) 0.65 % SOLN nasal spray Place 1 spray into both nostrils as needed for congestion.  . tamsulosin (FLOMAX) 0.4 MG CAPS capsule Take 0.4 mg by mouth daily after supper.   . pravastatin (PRAVACHOL) 40 MG tablet Take 1 tablet (40 mg total) by mouth daily. (  Patient not taking: Reported on 05/26/2020)  . [DISCONTINUED] doxycycline (VIBRA-TABS) 100 MG tablet Take 1 tablet (100 mg total) by mouth 2 (two) times daily. (Patient not taking: Reported on 05/26/2020)   No facility-administered medications prior to visit.    Allergies  Allergen Reactions  . Amoxicillin Rash    ++ got ceftriaxone in the past++ Has patient had a PCN reaction causing immediate rash, facial/tongue/throat swelling, SOB or lightheadedness with hypotension: Yes Has  patient had a PCN reaction causing severe rash involving mucus membranes or skin necrosis: No Has patient had a PCN reaction that required hospitalization No Has patient had a PCN reaction occurring within the last 10 years: No If all of the above answers are "NO", then may proceed with Cephalosporin use.      Patient Care Team: Birdie Sons, MD as PCP - General (Family Medicine) Royston Cowper, MD as Consulting Physician (Urology)  Review of Systems  Constitutional: Negative for appetite change, chills, fatigue and fever.  HENT: Negative for congestion, ear pain, hearing loss, nosebleeds and trouble swallowing.   Eyes: Negative for pain and visual disturbance.  Respiratory: Negative for cough, chest tightness and shortness of breath.   Cardiovascular: Negative for chest pain, palpitations and leg swelling.  Gastrointestinal: Negative for abdominal pain, blood in stool, constipation, diarrhea, nausea and vomiting.  Endocrine: Negative for polydipsia, polyphagia and polyuria.  Genitourinary: Negative for dysuria and flank pain.  Musculoskeletal: Positive for back pain. Negative for arthralgias, joint swelling, myalgias and neck stiffness.  Skin: Negative for color change, rash and wound.  Neurological: Negative for dizziness, tremors, seizures, speech difficulty, weakness, light-headedness and headaches.  Psychiatric/Behavioral: Negative for behavioral problems, confusion, decreased concentration, dysphoric mood and sleep disturbance. The patient is not nervous/anxious.   All other systems reviewed and are negative.       Objective    Vitals: BP 100/64 (BP Location: Left Arm, Patient Position: Sitting, Cuff Size: Normal)   Pulse 97   Temp 98.2 F (36.8 C) (Temporal)   Resp 18   Ht 5\' 11"  (1.803 m)   Wt 205 lb 3.2 oz (93.1 kg)   SpO2 96% Comment: room air  BMI 28.62 kg/m    Physical Exam   General Appearance:     Well developed, well nourished male. Alert,  cooperative, in no acute distress, appears stated age  Head:    Normocephalic, without obvious abnormality, atraumatic  Eyes:    PERRL, conjunctiva/corneas clear, EOM's intact, fundi    benign, both eyes       Ears:    Normal TM's and external ear canals, both ears  Neck:   Supple, symmetrical, trachea midline, no adenopathy;       thyroid:  No enlargement/tenderness/nodules; no carotid   bruit or JVD  Back:     Symmetric, no curvature, ROM normal, no CVA tenderness  Lungs:     Clear to auscultation bilaterally, respirations unlabored  Chest wall:    No tenderness or deformity  Heart:    Normal heart rate. Normal rhythm. No murmurs, rubs, or gallops.  S1 and S2 normal  Abdomen:     Soft, non-tender, bowel sounds active all four quadrants,    no masses, no organomegaly  Genitalia:    deferred  Rectal:    deferred  Extremities:   All extremities are intact. No cyanosis or edema  Pulses:   2+ and symmetric all extremities  Skin:   Skin color, texture, turgor normal, no rashes or lesions  Lymph nodes:   Cervical, supraclavicular, and axillary nodes normal  Neurologic:   CNII-XII intact. Normal strength, sensation and reflexes      throughout      Assessment & Plan        1. Annual physical exam Doing well He states he has had three doses of Covid vaccine, the flu vaccine, and two doses of Shingrix vaccine.   - PSA Total (Reflex To Free) ([facility] only) - CBC - Comprehensive metabolic panel - Lipid panel - TSH - Hemoglobin A1c  2. Prostate cancer screening  - PSA Total (Reflex To Free) ([facility] only)  3. Hypertriglyceridemia Stopped pravastatin after just a few weeks due to it making him feel 'jittery' Has not tried any other statins or cholesterol medications.   - CBC - Comprehensive metabolic panel - Lipid panel - TSH  4. Hyperglycemia  - Hemoglobin A1c       The entirety of the information documented in the History of Present Illness, Review of Systems and  Physical Exam were personally obtained by me. Portions of this information were initially documented by the CMA and reviewed by me for thoroughness and accuracy.      Lelon Huh, MD  [facility] [phone] (phone) [phone] (fax)  [place]

## 2020-05-26 NOTE — Progress Notes (Signed)
Annual Wellness Visit     Patient: Rodney Doucet., Male    DOB: [birth year], 75 y.o.   MRN: [id number] Visit Date: 05/26/2020  Today's Provider: Lelon Huh, MD   Subjective    Rodney Kindle. is a 75 y.o. male who presents today for his Annual Wellness Visit.     Medications: Outpatient Medications Prior to Visit  Medication Sig Note  . acetaminophen (TYLENOL) 325 MG tablet Take 650 mg by mouth every 6 (six) hours as needed for moderate pain or headache.   . hydrOXYzine (ATARAX/VISTARIL) 50 MG tablet Take 50 mg by mouth 3 (three) times daily as needed.   Marland Kitchen imipramine (TOFRANIL) 50 MG tablet Take 50 mg by mouth at bedtime.    . naproxen sodium (ALEVE) 220 MG tablet Take 440 mg by mouth daily.   . pentosan polysulfate (ELMIRON) 100 MG capsule Take 100 mg by mouth 3 (three) times daily.    . sodium chloride (OCEAN) 0.65 % SOLN nasal spray Place 1 spray into both nostrils as needed for congestion.   . tamsulosin (FLOMAX) 0.4 MG CAPS capsule Take 0.4 mg by mouth daily after supper.    . [DISCONTINUED] doxycycline (VIBRA-TABS) 100 MG tablet Take 1 tablet (100 mg total) by mouth 2 (two) times daily. (Patient not taking: Reported on 05/26/2020)   . [DISCONTINUED] pravastatin (PRAVACHOL) 40 MG tablet Take 1 tablet (40 mg total) by mouth daily. (Patient not taking: Reported on 05/26/2020) 05/26/2020: jittery   No facility-administered medications prior to visit.    Allergies  Allergen Reactions  . Pravastatin     jittery  . Amoxicillin Rash    ++ got ceftriaxone in the past++ Has patient had a PCN reaction causing immediate rash, facial/tongue/throat swelling, SOB or lightheadedness with hypotension: Yes Has patient had a PCN reaction causing severe rash involving mucus membranes or skin necrosis: No Has patient had a PCN reaction that required hospitalization No Has patient had a PCN reaction occurring within the last 10 years: No If all of the above answers are "NO", then  may proceed with Cephalosporin use.      Patient Care Team: Birdie Sons, MD as PCP - General (Family Medicine) Royston Cowper, MD as Consulting Physician (Urology)       Objective     Most recent functional status assessment: In your present state of health, do you have any difficulty performing the following activities: 05/26/2020  Hearing? Y  Vision? N  Difficulty concentrating or making decisions? N  Walking or climbing stairs? N  Dressing or bathing? N  Doing errands, shopping? N  Some recent data might be hidden   Most recent fall risk assessment: Fall Risk  05/26/2020  Falls in the past year? 0  Comment -  Number falls in past yr: 0  Injury with Fall? 0  Follow up Falls evaluation completed    Most recent depression screenings: PHQ 2/9 Scores 05/26/2020 01/11/2019  PHQ - 2 Score 0 0  PHQ- 9 Score 0 0   Most recent cognitive screening: 6CIT Screen 05/26/2020  What Year? 0 points  What month? 0 points  What time? 0 points  Count back from 20 0 points  Months in reverse 0 points  Repeat phrase 4 points  Total Score 4   Most recent Audit-C alcohol use screening Alcohol Use Disorder Test (AUDIT) 05/26/2020  1. How often do you have a drink containing alcohol? 0  2. How many drinks containing  alcohol do you have on a typical day when you are drinking? 0  3. How often do you have six or more drinks on one occasion? 0  AUDIT-C Score 0  Alcohol Brief Interventions/Follow-up AUDIT Score <7 follow-up not indicated   A score of 3 or more in women, and 4 or more in men indicates increased risk for alcohol abuse, EXCEPT if all of the points are from question 1   No results found for any visits on 05/26/20.  Assessment & Plan     Annual wellness visit done today including the all of the following: Reviewed patient's Family Medical History Reviewed and updated list of patient's medical providers Assessment of cognitive impairment was done Assessed patient's  functional ability Established a written schedule for health screening [place] Completed and Reviewed  Exercise Activities and Dietary recommendations Goals   None     Immunization History  Administered Date(s) Administered  . Fluad Quad(high Dose 65+) 11/30/2018  . Influenza, High Dose Seasonal PF 12/26/2016, 12/23/2017  . Influenza-Unspecified 12/03/2014  . Pneumococcal Conjugate-13 03/09/2014  . Pneumococcal Polysaccharide-23 04/06/2015  . Tdap 04/06/2015  . Zoster 03/05/2011  . Zoster Recombinat (Shingrix) 01/14/2019    Health Maintenance  Topic Date Due  . COVID-19 Vaccine (1) Never done  . INFLUENZA VACCINE  07/12/2020 (Originally 10/03/2019)  . TETANUS/TDAP  04/05/2025  . COLONOSCOPY (Pts 45-48yrs Insurance coverage will need to be confirmed)  12/13/2027  . Hepatitis C Screening  Completed  . PNA vac Low Risk Adult  Completed  . HPV VACCINES  Aged Out     Discussed health benefits of physical activity, and encouraged him to engage in regular exercise appropriate for his age and condition.         The entirety of the information documented in the History of Present Illness, Review of Systems and Physical Exam were personally obtained by me. Portions of this information were initially documented by the CMA and reviewed by me for thoroughness and accuracy.      Lelon Huh, MD  [facility] [phone] (phone) [phone] (fax)  [place]

## 2020-05-29 DIAGNOSIS — E781 Pure hyperglyceridemia: Secondary | ICD-10-CM | POA: Diagnosis not present

## 2020-05-29 DIAGNOSIS — R739 Hyperglycemia, unspecified: Secondary | ICD-10-CM | POA: Diagnosis not present

## 2020-05-29 DIAGNOSIS — Z0000 Encounter for general adult medical examination without abnormal findings: Secondary | ICD-10-CM | POA: Diagnosis not present

## 2020-05-29 DIAGNOSIS — Z125 Encounter for screening for malignant neoplasm of prostate: Secondary | ICD-10-CM | POA: Diagnosis not present

## 2020-05-30 ENCOUNTER — Encounter: Payer: Self-pay | Admitting: Family Medicine

## 2020-05-30 LAB — CBC
Hematocrit: 47.6 % (ref 37.5–51.0)
Hemoglobin: 16.2 g/dL (ref 13.0–17.7)
MCH: 30.7 pg (ref 26.6–33.0)
MCHC: 34 g/dL (ref 31.5–35.7)
MCV: 90 fL (ref 79–97)
Platelets: 256 10*3/uL (ref 150–450)
RBC: 5.27 x10E6/uL (ref 4.14–5.80)
RDW: 11.8 % (ref 11.6–15.4)
WBC: 10.4 10*3/uL (ref 3.4–10.8)

## 2020-05-30 LAB — COMPREHENSIVE METABOLIC PANEL WITH GFR
ALT: 14 [IU]/L (ref 0–44)
AST: 18 [IU]/L (ref 0–40)
Albumin/Globulin Ratio: 1.4 (ref 1.2–2.2)
Albumin: 4.5 g/dL (ref 3.7–4.7)
Alkaline Phosphatase: 73 [IU]/L (ref 44–121)
BUN/Creatinine Ratio: 11 (ref 10–24)
BUN: 15 mg/dL (ref 8–27)
Bilirubin Total: 0.4 mg/dL (ref 0.0–1.2)
CO2: 22 mmol/L (ref 20–29)
Calcium: 9.9 mg/dL (ref 8.6–10.2)
Chloride: 100 mmol/L (ref 96–106)
Creatinine, Ser: 1.35 mg/dL — ABNORMAL HIGH (ref 0.76–1.27)
Globulin, Total: 3.2 g/dL (ref 1.5–4.5)
Glucose: 137 mg/dL — ABNORMAL HIGH (ref 65–99)
Potassium: 5.1 mmol/L (ref 3.5–5.2)
Sodium: 136 mmol/L (ref 134–144)
Total Protein: 7.7 g/dL (ref 6.0–8.5)
eGFR: 55 mL/min/{1.73_m2} — ABNORMAL LOW

## 2020-05-30 LAB — LIPID PANEL
Chol/HDL Ratio: 5.2 ratio — ABNORMAL HIGH (ref 0.0–5.0)
Cholesterol, Total: 198 mg/dL (ref 100–199)
HDL: 38 mg/dL — ABNORMAL LOW
LDL Chol Calc (NIH): 122 mg/dL — ABNORMAL HIGH (ref 0–99)
Triglycerides: 217 mg/dL — ABNORMAL HIGH (ref 0–149)
VLDL Cholesterol Cal: 38 mg/dL (ref 5–40)

## 2020-05-30 LAB — TSH: TSH: 2.96 u[IU]/mL (ref 0.450–4.500)

## 2020-05-30 LAB — HEMOGLOBIN A1C
Est. average glucose Bld gHb Est-mCnc: 137 mg/dL
Hgb A1c MFr Bld: 6.4 % — ABNORMAL HIGH (ref 4.8–5.6)

## 2020-05-30 LAB — PSA TOTAL (REFLEX TO FREE): Prostate Specific Ag, Serum: 0.4 ng/mL (ref 0.0–4.0)

## 2020-07-17 ENCOUNTER — Ambulatory Visit: Payer: PPO | Admitting: Dermatology

## 2020-07-17 ENCOUNTER — Other Ambulatory Visit: Payer: Self-pay

## 2020-07-17 DIAGNOSIS — Z85828 Personal history of other malignant neoplasm of skin: Secondary | ICD-10-CM

## 2020-07-17 DIAGNOSIS — L570 Actinic keratosis: Secondary | ICD-10-CM | POA: Diagnosis not present

## 2020-07-17 DIAGNOSIS — L578 Other skin changes due to chronic exposure to nonionizing radiation: Secondary | ICD-10-CM | POA: Diagnosis not present

## 2020-07-17 DIAGNOSIS — B078 Other viral warts: Secondary | ICD-10-CM | POA: Diagnosis not present

## 2020-07-17 DIAGNOSIS — L821 Other seborrheic keratosis: Secondary | ICD-10-CM | POA: Diagnosis not present

## 2020-07-17 DIAGNOSIS — L814 Other melanin hyperpigmentation: Secondary | ICD-10-CM | POA: Diagnosis not present

## 2020-07-17 DIAGNOSIS — L820 Inflamed seborrheic keratosis: Secondary | ICD-10-CM | POA: Diagnosis not present

## 2020-07-17 DIAGNOSIS — L219 Seborrheic dermatitis, unspecified: Secondary | ICD-10-CM

## 2020-07-17 DIAGNOSIS — D229 Melanocytic nevi, unspecified: Secondary | ICD-10-CM | POA: Diagnosis not present

## 2020-07-17 DIAGNOSIS — Z1283 Encounter for screening for malignant neoplasm of skin: Secondary | ICD-10-CM | POA: Diagnosis not present

## 2020-07-17 DIAGNOSIS — D1800 Hemangioma unspecified site: Secondary | ICD-10-CM | POA: Diagnosis not present

## 2020-07-17 MED ORDER — KETOCONAZOLE 2 % EX CREA: 1.0000 "application " | TOPICAL_CREAM | Freq: Every day | CUTANEOUS | 6 refills | Status: AC

## 2020-07-17 NOTE — Patient Instructions (Signed)

## 2020-07-17 NOTE — Progress Notes (Signed)
Follow-Up Visit   Subjective  Rodney Hall. is a 75 y.o. male who presents for the following: Annual Exam (History of BCC and SCC - TBSE today). The patient presents for Total-Body Skin Exam (TBSE) for skin cancer screening and mole check.  The following portions of the chart were reviewed this encounter and updated as appropriate:   Tobacco  Allergies  Meds  Problems  Med Hx  Surg Hx  Fam Hx     Review of Systems:  No other skin or systemic complaints except as noted in HPI or Assessment and Plan.  Objective  Well appearing patient in no apparent distress; mood and affect are within normal limits.  A full examination was performed including scalp, head, eyes, ears, nose, lips, neck, chest, axillae, abdomen, back, buttocks, bilateral upper extremities, bilateral lower extremities, hands, feet, fingers, toes, fingernails, and toenails. All findings within normal limits unless otherwise noted below.  Objective  Face, ears (10): Erythematous thin papules/macules with gritty scale.   Objective  Chest - Medial (Center): Pinkness and scale  Objective  Left Knee - Anterior: Verrucous papules -- Discussed viral etiology and contagion.   Objective  Left Thigh - Anterior: Erythematous keratotic or waxy stuck-on papule or plaque.    Assessment & Plan    Lentigines - Scattered tan macules - Due to sun exposure - Benign-appering, observe - Recommend daily broad spectrum sunscreen SPF 30+ to sun-exposed areas, reapply every 2 hours as needed. - Call for any changes  Seborrheic Keratoses - Stuck-on, waxy, tan-brown papules and/or plaques  - Benign-appearing - Discussed benign etiology and prognosis. - Observe - Call for any changes  Melanocytic Nevi - Tan-brown and/or pink-flesh-colored symmetric macules and papules - Benign appearing on exam today - Observation - Call clinic for new or changing moles - Recommend daily use of broad spectrum spf 30+ sunscreen to  sun-exposed areas.   Hemangiomas - Red papules - Discussed benign nature - Observe - Call for any changes  Actinic Damage - Chronic condition, secondary to cumulative UV/sun exposure - diffuse scaly erythematous macules with underlying dyspigmentation - Recommend daily broad spectrum sunscreen SPF 30+ to sun-exposed areas, reapply every 2 hours as needed.  - Staying in the shade or wearing long sleeves, sun glasses (UVA+UVB protection) and wide brim hats (4-inch brim around the entire circumference of the hat) are also recommended for sun protection.  - Call for new or changing lesions.  Skin cancer screening performed today.  AK (actinic keratosis) (10) Face, ears  Destruction of lesion - Face, ears Complexity: simple   Destruction method: cryotherapy   Informed consent: discussed and consent obtained   Timeout:  patient name, date of birth, surgical site, and procedure verified Lesion destroyed using liquid nitrogen: Yes   Region frozen until ice ball extended beyond lesion: Yes   Outcome: patient tolerated procedure well with no complications   Post-procedure details: wound care instructions given    Seborrheic dermatitis Chest - Medial (Center)  Seborrheic Dermatitis  -  is a chronic persistent rash characterized by pinkness and scaling most commonly of the mid face but also can occur on the scalp (dandruff), ears; mid chest and mid back. It tends to be exacerbated by stress and cooler weather.  People who have neurologic disease may experience new onset or exacerbation of existing seborrheic dermatitis.  The condition is not curable but treatable and can be controlled.  Start Ketoconazole 2% cream qhs  ketoconazole (NIZORAL) 2 % cream - Chest -  Medial (Center)  Other viral warts Left Knee - Anterior  Destruction of lesion - Left Knee - Anterior Complexity: simple   Destruction method: cryotherapy   Informed consent: discussed and consent obtained   Timeout:   patient name, date of birth, surgical site, and procedure verified Lesion destroyed using liquid nitrogen: Yes   Region frozen until ice ball extended beyond lesion: Yes   Outcome: patient tolerated procedure well with no complications   Post-procedure details: wound care instructions given    Inflamed seborrheic keratosis Left Thigh - Anterior  Destruction of lesion - Left Thigh - Anterior Complexity: simple   Destruction method: cryotherapy   Informed consent: discussed and consent obtained   Timeout:  patient name, date of birth, surgical site, and procedure verified Lesion destroyed using liquid nitrogen: Yes   Region frozen until ice ball extended beyond lesion: Yes   Outcome: patient tolerated procedure well with no complications   Post-procedure details: wound care instructions given    Skin cancer screening  Return in about 6 months (around 01/17/2021) for UBSE.  I, Ashok Cordia, CMA, am acting as scribe for Sarina Ser, MD .  Documentation: I have reviewed the above documentation for accuracy and completeness, and I agree with the above.  Sarina Ser, MD

## 2020-07-18 DIAGNOSIS — M5416 Radiculopathy, lumbar region: Secondary | ICD-10-CM | POA: Diagnosis not present

## 2020-07-25 ENCOUNTER — Encounter: Payer: Self-pay | Admitting: Dermatology

## 2020-08-04 DIAGNOSIS — M545 Low back pain, unspecified: Secondary | ICD-10-CM | POA: Diagnosis not present

## 2020-08-04 DIAGNOSIS — M5416 Radiculopathy, lumbar region: Secondary | ICD-10-CM | POA: Diagnosis not present

## 2020-08-04 DIAGNOSIS — M5136 Other intervertebral disc degeneration, lumbar region: Secondary | ICD-10-CM | POA: Diagnosis not present

## 2020-08-05 DIAGNOSIS — Z20822 Contact with and (suspected) exposure to covid-19: Secondary | ICD-10-CM | POA: Diagnosis not present

## 2020-09-01 DIAGNOSIS — H43813 Vitreous degeneration, bilateral: Secondary | ICD-10-CM | POA: Diagnosis not present

## 2020-11-21 DIAGNOSIS — N401 Enlarged prostate with lower urinary tract symptoms: Secondary | ICD-10-CM | POA: Diagnosis not present

## 2020-11-21 DIAGNOSIS — N3281 Overactive bladder: Secondary | ICD-10-CM | POA: Diagnosis not present

## 2020-11-21 DIAGNOSIS — N3020 Other chronic cystitis without hematuria: Secondary | ICD-10-CM | POA: Diagnosis not present

## 2020-11-21 DIAGNOSIS — Z125 Encounter for screening for malignant neoplasm of prostate: Secondary | ICD-10-CM | POA: Diagnosis not present

## 2020-12-04 DIAGNOSIS — M5416 Radiculopathy, lumbar region: Secondary | ICD-10-CM | POA: Diagnosis not present

## 2020-12-25 DIAGNOSIS — M5416 Radiculopathy, lumbar region: Secondary | ICD-10-CM | POA: Diagnosis not present

## 2021-01-09 ENCOUNTER — Encounter: Payer: Self-pay | Admitting: Dermatology

## 2021-01-09 ENCOUNTER — Other Ambulatory Visit: Payer: Self-pay

## 2021-01-09 ENCOUNTER — Ambulatory Visit: Payer: PPO | Admitting: Dermatology

## 2021-01-09 DIAGNOSIS — L814 Other melanin hyperpigmentation: Secondary | ICD-10-CM | POA: Diagnosis not present

## 2021-01-09 DIAGNOSIS — D1801 Hemangioma of skin and subcutaneous tissue: Secondary | ICD-10-CM | POA: Diagnosis not present

## 2021-01-09 DIAGNOSIS — L570 Actinic keratosis: Secondary | ICD-10-CM

## 2021-01-09 DIAGNOSIS — L578 Other skin changes due to chronic exposure to nonionizing radiation: Secondary | ICD-10-CM | POA: Diagnosis not present

## 2021-01-09 DIAGNOSIS — Z85828 Personal history of other malignant neoplasm of skin: Secondary | ICD-10-CM

## 2021-01-09 DIAGNOSIS — D229 Melanocytic nevi, unspecified: Secondary | ICD-10-CM

## 2021-01-09 DIAGNOSIS — Z872 Personal history of diseases of the skin and subcutaneous tissue: Secondary | ICD-10-CM | POA: Diagnosis not present

## 2021-01-09 DIAGNOSIS — L820 Inflamed seborrheic keratosis: Secondary | ICD-10-CM

## 2021-01-09 DIAGNOSIS — L821 Other seborrheic keratosis: Secondary | ICD-10-CM | POA: Diagnosis not present

## 2021-01-09 DIAGNOSIS — L918 Other hypertrophic disorders of the skin: Secondary | ICD-10-CM | POA: Diagnosis not present

## 2021-01-09 DIAGNOSIS — Z1283 Encounter for screening for malignant neoplasm of skin: Secondary | ICD-10-CM

## 2021-01-09 NOTE — Patient Instructions (Signed)

## 2021-01-09 NOTE — Progress Notes (Signed)
Follow-Up Visit   Subjective  Rodney Hall. is a 75 y.o. male who presents for the following: UBSE (Patient has a hx of AK's, SCC, and BCC - he has noticed no new or changing moles, lesions, or spots.).  The following portions of the chart were reviewed this encounter and updated as appropriate:   Tobacco  Allergies  Meds  Problems  Med Hx  Surg Hx  Fam Hx     Review of Systems:  No other skin or systemic complaints except as noted in HPI or Assessment and Plan.  Objective  Well appearing patient in no apparent distress; mood and affect are within normal limits.  A focused examination was performed including the face. Relevant physical exam findings are noted in the Assessment and Plan.  Face x 11 (11) Erythematous thin papules/macules with gritty scale.   R low back x 2, neck x 1 (3) Erythematous keratotic or waxy stuck-on papule or plaque.    Assessment & Plan  AK (actinic keratosis) (11) Face x 11  Destruction of lesion - Face x 11 Complexity: simple   Destruction method: cryotherapy   Informed consent: discussed and consent obtained   Timeout:  patient name, date of birth, surgical site, and procedure verified Lesion destroyed using liquid nitrogen: Yes   Region frozen until ice ball extended beyond lesion: Yes   Outcome: patient tolerated procedure well with no complications   Post-procedure details: wound care instructions given    Inflamed seborrheic keratosis R low back x 2, neck x 1  Destruction of lesion - R low back x 2, neck x 1 Complexity: simple   Destruction method: cryotherapy   Informed consent: discussed and consent obtained   Timeout:  patient name, date of birth, surgical site, and procedure verified Lesion destroyed using liquid nitrogen: Yes   Region frozen until ice ball extended beyond lesion: Yes   Outcome: patient tolerated procedure well with no complications   Post-procedure details: wound care instructions given     Lentigines - Scattered tan macules - Due to sun exposure - Benign-appearing, observe - Recommend daily broad spectrum sunscreen SPF 30+ to sun-exposed areas, reapply every 2 hours as needed. - Call for any changes  Seborrheic Keratoses - Stuck-on, waxy, tan-brown papules and/or plaques  - Benign-appearing - Discussed benign etiology and prognosis. - Observe - Call for any changes  Melanocytic Nevi - Tan-brown and/or pink-flesh-colored symmetric macules and papules - Benign appearing on exam today - Observation - Call clinic for new or changing moles - Recommend daily use of broad spectrum spf 30+ sunscreen to sun-exposed areas.   Hemangiomas - Red papules - Discussed benign nature - Observe - Call for any changes  Actinic Damage - Chronic condition, secondary to cumulative UV/sun exposure - diffuse scaly erythematous macules with underlying dyspigmentation - Recommend daily broad spectrum sunscreen SPF 30+ to sun-exposed areas, reapply every 2 hours as needed.  - Staying in the shade or wearing long sleeves, sun glasses (UVA+UVB protection) and wide brim hats (4-inch brim around the entire circumference of the hat) are also recommended for sun protection.  - Call for new or changing lesions.  History of Squamous Cell Carcinoma of the Skin - No evidence of recurrence today - No lymphadenopathy - Recommend regular full body skin exams - Recommend daily broad spectrum sunscreen SPF 30+ to sun-exposed areas, reapply every 2 hours as needed.  - Call if any new or changing lesions are noted between office visits  History  of Basal Cell Carcinoma of the Skin - No evidence of recurrence today - Recommend regular full body skin exams - Recommend daily broad spectrum sunscreen SPF 30+ to sun-exposed areas, reapply every 2 hours as needed.  - Call if any new or changing lesions are noted between office visits  Acrochordons (Skin Tags) - Fleshy, skin-colored pedunculated  papules - Benign appearing.  - Observe. - If desired, they can be removed with an in office procedure that is not covered by insurance. - Please call the clinic if you notice any new or changing lesions.  Return in about 6 months (around 07/09/2021) for TBSE.  Luther Redo, CMA, am acting as scribe for Sarina Ser, MD . Documentation: I have reviewed the above documentation for accuracy and completeness, and I agree with the above.  Sarina Ser, MD

## 2021-02-02 ENCOUNTER — Other Ambulatory Visit: Payer: Self-pay

## 2021-02-02 ENCOUNTER — Encounter: Payer: Self-pay | Admitting: Family Medicine

## 2021-02-02 ENCOUNTER — Ambulatory Visit (INDEPENDENT_AMBULATORY_CARE_PROVIDER_SITE_OTHER): Payer: PPO | Admitting: Family Medicine

## 2021-02-02 VITALS — BP 128/85 | HR 94 | Temp 97.8°F | Wt 203.0 lb

## 2021-02-02 DIAGNOSIS — H9202 Otalgia, left ear: Secondary | ICD-10-CM

## 2021-02-02 MED ORDER — NEOMYCIN-POLYMYXIN-HC 3.5-10000-1 OT SUSP: 3.0000 [drp] | Freq: Four times a day (QID) | OTIC | 0 refills | Status: DC

## 2021-02-02 MED ORDER — DOXYCYCLINE HYCLATE 100 MG PO TABS: 100.0000 mg | ORAL_TABLET | Freq: Two times a day (BID) | ORAL | 0 refills | Status: AC

## 2021-02-02 NOTE — Progress Notes (Signed)
      Established patient visit   Patient: Rodney Hall.   DOB: 09-24-1945   75 y.o. Male  MRN: [id number] Visit Date: 02/02/2021  Today's healthcare provider: Lelon Huh, MD   Chief Complaint  Patient presents with   Ear Pain    Subjective    Otalgia  There is pain in the left ear. This is a new problem. The current episode started in the past 7 days. The problem has been waxing and waning. There has been no fever. Associated symptoms include rhinorrhea. Pertinent negatives include no abdominal pain, coughing, diarrhea, ear discharge, headaches, hearing loss, neck pain, rash, sore throat or vomiting.      Medications: Outpatient Medications Prior to Visit  Medication Sig   acetaminophen (TYLENOL) 325 MG tablet Take 650 mg by mouth every 6 (six) hours as needed for moderate pain or headache.   imipramine (TOFRANIL) 50 MG tablet Take 50 mg by mouth at bedtime.    naproxen sodium (ALEVE) 220 MG tablet Take 440 mg by mouth daily.   sodium chloride (OCEAN) 0.65 % SOLN nasal spray Place 1 spray into both nostrils as needed for congestion.   tamsulosin (FLOMAX) 0.4 MG CAPS capsule Take 0.4 mg by mouth daily after supper.    hydrOXYzine (ATARAX/VISTARIL) 50 MG tablet Take 50 mg by mouth 3 (three) times daily as needed.   pentosan polysulfate (ELMIRON) 100 MG capsule Take 100 mg by mouth 3 (three) times daily.    No facility-administered medications prior to visit.    Review of Systems  Constitutional: Negative.   HENT:  Positive for congestion, ear pain, postnasal drip, rhinorrhea, sinus pressure and sinus pain. Negative for ear discharge, hearing loss, sore throat and tinnitus.   Respiratory: Negative.  Negative for cough.   Gastrointestinal:  Negative for abdominal pain, diarrhea and vomiting.  Musculoskeletal:  Negative for neck pain.  Skin:  Negative for rash.  Allergic/Immunologic: Positive for environmental allergies.  Neurological:  Negative for dizziness,  light-headedness and headaches.      Objective    BP 128/85 (BP Location: Left Arm, Patient Position: Sitting, Cuff Size: Large)   Pulse 94   Temp 97.8 F (36.6 C) (Oral)   Wt 203 lb (92.1 kg)   SpO2 98%   BMI 28.31 kg/m    Physical Exam  General Appearance:     Well developed, well nourished male, alert, cooperative, in no acute distress  HENT:   Left ear canal slightly inflamed with dull TM. Right TM and canal unremarkable.         Assessment & Plan     1. Otalgia of left ear  - doxycycline (VIBRA-TABS) 100 MG tablet; Take 1 tablet (100 mg total) by mouth 2 (two) times daily for 14 days.  Dispense: 28 tablet; Refill: 0 - neomycin-polymyxin-hydrocortisone (CORTISPORIN) 3.5-10000-1 OTIC suspension; Place 3 drops into the left ear 4 (four) times daily.  Dispense: 10 mL; Refill: 0      The entirety of the information documented in the History of Present Illness, Review of Systems and Physical Exam were personally obtained by me. Portions of this information were initially documented by the CMA and reviewed by me for thoroughness and accuracy.     Lelon Huh, MD  [facility] [phone] (phone) [phone] (fax)  [place]

## 2021-05-01 DIAGNOSIS — M545 Low back pain, unspecified: Secondary | ICD-10-CM | POA: Diagnosis not present

## 2021-06-19 ENCOUNTER — Telehealth: Payer: Self-pay | Admitting: Family Medicine

## 2021-06-19 NOTE — Telephone Encounter (Signed)
Copied from [place]. Topic: Medicare AWV ?>> Jun 19, 2021  2:46 PM Cher Nakai R wrote: ?Reason for CRM:  ?Left message for patient to call back and schedule Medicare Annual Wellness Visit (AWV) in office.  ? ?If unable to come into the office for AWV,  please offer to do virtually or by telephone. ? ?Last AWV:  05/26/2020 ? ?Please schedule at anytime with [facility] Health Advisor. ? ?30 minute appointment for Virtual or phone ?45 minute appointment for in office or Initial virtual/phone ? ?Any questions, please contact me at [phone] ?

## 2021-06-28 ENCOUNTER — Ambulatory Visit (INDEPENDENT_AMBULATORY_CARE_PROVIDER_SITE_OTHER): Payer: PPO

## 2021-06-28 VITALS — BP 142/80 | HR 75 | Ht 70.0 in | Wt 204.0 lb

## 2021-06-28 DIAGNOSIS — Z0000 Encounter for general adult medical examination without abnormal findings: Secondary | ICD-10-CM

## 2021-06-28 NOTE — Progress Notes (Signed)
 "  Subjective:   Rodney Hall. is a 76 y.o. male who presents for Medicare Annual/Subsequent preventive examination.  Review of Systems           Objective:    Today's Vitals   06/28/21 1003  BP: (!) 142/80  Pulse: 75  SpO2: 97%  Weight: 204 lb (92.5 kg)  Height: 5' 10 (1.778 m)   Body mass index is 29.27 kg/m.     12/12/2017   10:28 AM 07/22/2017   11:24 AM 07/10/2017    9:22 AM 07/08/2017   10:10 AM 06/02/2017    5:11 AM 10/18/2016    9:39 AM 07/22/2016    9:54 AM  Advanced Directives  Does Patient Have a Medical Advance Directive? No No No No No No No  Would patient like information on creating a medical advance directive?  No - Patient declined No - Patient declined No - Patient declined No - Patient declined No - Patient declined     Current Medications (verified) Outpatient Encounter Medications as of 06/28/2021  Medication Sig   Acetaminophen  (TYLENOL ) 325 MG CAPS Tylenol    acetaminophen  (TYLENOL ) 325 MG tablet Take 650 mg by mouth every 6 (six) hours as needed for moderate pain or headache.   ciprofloxacin  (CIPRO ) 500 MG tablet ciprofloxacin  500 mg tablet  TAKE 1 TABLET BY MOUTH EVERY 12 HOURS   cyclobenzaprine  (FLEXERIL ) 5 MG tablet cyclobenzaprine  5 mg tablet  TAKE 1 TABLET BY MOUTH THREE TIMES DAILY   docusate sodium  (COLACE) 100 MG capsule Colace 100 mg capsule  Take 1 capsule twice a day by oral route.   doxycycline  (VIBRA -TABS) 100 MG tablet doxycycline  hyclate 100 mg tablet   HYDROcodone -acetaminophen  (NORCO/VICODIN) 5-325 MG tablet hydrocodone  5 mg-acetaminophen  325 mg tablet   hydrOXYzine (ATARAX/VISTARIL) 50 MG tablet Take 50 mg by mouth 3 (three) times daily as needed.   imipramine  (TOFRANIL ) 50 MG tablet Take 50 mg by mouth at bedtime.    Influenza vac split quadrivalent PF (FLUZONE HIGH-DOSE) 0.5 ML injection Fluzone High-Dose 2019-20 (PF) 180 mcg/0.5 mL intramuscular syringe  TO BE ADMINISTERED BY PHARMACIST FOR IMMUNIZATION   meloxicam  (MOBIC )  15 MG tablet meloxicam  15 mg tablet   mirabegron ER (MYRBETRIQ) 25 MG TB24 tablet Myrbetriq 25 mg tablet,extended release   naproxen (NAPROSYN) 250 MG tablet naproxen 250 mg tablet  Take 1 tablet twice a day by oral route.   naproxen sodium (ALEVE) 220 MG tablet Take 440 mg by mouth daily.   neomycin -polymyxin-hydrocortisone (CORTISPORIN) 3.5-10000-1 OTIC suspension Place 3 drops into the left ear 4 (four) times daily.   oxyCODONE  (OXY IR/ROXICODONE ) 5 MG immediate release tablet oxycodone  5 mg tablet  TAKE 1 TABLET BY MOUTH EVERY 12 HOURS AS NEEDED FOR PAIN   pentosan polysulfate (ELMIRON ) 100 MG capsule Take 100 mg by mouth 3 (three) times daily.    predniSONE (DELTASONE) 5 MG tablet prednisone 5 mg tablets in a dose pack  TAKE 6 TABLETS ON DAY 1 THEN TAKE 5 TABLETS ON DAY 2 THEN TAKE 4 TABLETS ON DAY 3 THEN TAKE 3 TABLETS ON DAY 4 THEN TAKE 2 TABLETS ON DAY 5 THEN TAKE 1 TABLET ON DAY 6. TAKE ALL DOSES WITH FOOD. WITH FOOD   predniSONE (STERAPRED UNI-PAK 21 TAB) 5 MG (21) TBPK tablet    sodium chloride  (OCEAN) 0.65 % SOLN nasal spray Place 1 spray into both nostrils as needed for congestion.   sulfamethoxazole-trimethoprim (BACTRIM DS) 800-160 MG tablet sulfamethoxazole 800 mg-trimethoprim 160 mg tablet  TAKE 1 TABLET BY MOUTH EVERY 12 HOURS FOR 10 DAYS   tamsulosin  (FLOMAX ) 0.4 MG CAPS capsule Take 0.4 mg by mouth daily after supper.    valACYclovir  (VALTREX ) 1000 MG tablet valacyclovir  1 gram tablet   No facility-administered encounter medications on file as of 06/28/2021.    Allergies (verified) Pravastatin  and Amoxicillin   History: Past Medical History:  Diagnosis Date   Abdominal mass    Abdominal pain 06/02/2017   Anxiety    Pt. denies   Arthritis    pt. denies   Basal cell carcinoma 01/21/2017   left lat forehead   BPH with obstruction/lower urinary tract symptoms    History of actinic keratoses    Hyperglycemia    Interstitial cystitis    Pancreatic cyst     Pneumonia    27 years ago   Squamous cell carcinoma of skin 08/14/2016   left above alar crease sidewall of nose/in situ   Past Surgical History:  Procedure Laterality Date   ANTERIOR LAT LUMBAR FUSION Right 07/30/2016   Procedure: Right Lateral two-three Lateral transpsoas interbody fusion with lateral plating/Minimally invasive decompression at Lumbar two-three;  Surgeon: Ditty, Morene Hicks, MD;  Location[facility] OR;  Service: Neurosurgery;  Laterality: Right;   BACK SURGERY  2012   CATARACT EXTRACTION, BILATERAL     CHOLECYSTECTOMY N/A 07/25/2017   Procedure: LAPAROSCOPIC CHOLECYSTECTOMY WITH INTRAOPERATIVE CHOLANGIOGRAM;  Surgeon: Gladis Cough, MD;  Location: [facility] ORS;  Service: General;  Laterality: N/A;   COLONOSCOPY WITH PROPOFOL  N/A 12/12/2017   Procedure: COLONOSCOPY WITH PROPOFOL ;  Surgeon: Viktoria Lamar DASEN, MD;  Location: [facility] ENDOSCOPY;  Service: Endoscopy;  Laterality: N/A;   EUS N/A 07/10/2017   Procedure: UPPER ENDOSCOPIC ULTRASOUND (EUS) LINEAR;  Surgeon: Teressa Toribio SQUIBB, MD;  Location: [facility] ENDOSCOPY;  Service: Endoscopy;  Laterality: N/A;   EYE SURGERY     HEMI-MICRODISCECTOMY LUMBAR LAMINECTOMY LEVEL 1  09/2018   [facility]   NECK SURGERY  2000   TENNIS ELBOW RELEASE/NIRSCHEL PROCEDURE     TONSILLECTOMY AND ADENOIDECTOMY     Family History  Problem Relation Age of Onset   Cancer Mother        brain   Diabetes Father    Rheumatic fever Sister    Heart disease Sister    Diabetes Brother    Social History   Socioeconomic History   Marital status: Married    Spouse name: Not on file   Number of children: Not on file   Years of education: Not on file   Highest education level: Not on file  Occupational History   Not on file  Tobacco Use   Smoking status: Never   Smokeless tobacco: Never  Vaping Use   Vaping Use: Never used  Substance and Sexual Activity   Alcohol use: No    Alcohol/week: 0.0 standard drinks   Drug use: No   Sexual activity:  Yes  Other Topics Concern   Not on file  Social History Narrative   Not on file   Social Determinants of Health   Financial Resource Strain: Not on file  Food Insecurity: Not on file  Transportation Needs: Not on file  Physical Activity: Not on file  Stress: Not on file  Social Connections: Not on file    Tobacco Counseling Counseling given: Not Answered   Clinical Intake:  Pre-visit preparation completed: Yes  Pain : No/denies pain     Nutritional Risks: None Diabetes: No  How often do you need to have  someone help you when you read instructions, pamphlets, or other written materials from your doctor or pharmacy?: 1 - Never  Diabetic?no  Interpreter Needed?: No  Information entered by :: Jhonnie Das, LPN   Activities of Daily Living     View : No data to display.          Patient Care Team: Gasper Nancyann BRAVO, MD as PCP - General (Family Medicine) Kassie Ozell SAUNDERS, MD as Consulting Physician (Urology)  Indicate any recent Medical Services you may have received from other than [facility] providers in the past year (date may be approximate).     Assessment:   This is a routine wellness examination for [facility].  Hearing/Vision screen No results found.  Dietary issues and exercise activities discussed:     Goals Addressed   None    Depression Screen    05/26/2020    2:04 PM 01/11/2019    2:02 PM 05/01/2017    8:54 AM 04/22/2016    9:08 AM 04/06/2015    8:54 AM  PHQ 2/9 Scores  PHQ - 2 Score 0 0 0 0 0  PHQ- 9 Score 0 0       Fall Risk    05/26/2020    2:04 PM 09/28/2019    1:18 PM 01/11/2019    2:02 PM 10/02/2018    9:21 AM 05/01/2017    8:54 AM  Fall Risk   Falls in the past year? 0 0 0 0 No  Comment  [place] Telephone Survey: data to providers prior to load  [place] Survey: data to providers prior to load   Number falls in past yr: 0  0    Injury with Fall? 0  0    Follow up Falls evaluation completed  Falls evaluation completed       FALL RISK PREVENTION PERTAINING TO THE HOME:  Any stairs in or around the home? No  If so, are there any without handrails? No  Home free of loose throw rugs in walkways, pet beds, electrical cords, etc? Yes  Adequate lighting in your home to reduce risk of falls? Yes   ASSISTIVE DEVICES UTILIZED TO PREVENT FALLS:  Life alert? No  Use of a cane, walker or w/c? Yes  Grab bars in the bathroom? Yes  Shower chair or bench in shower? No  Elevated toilet seat or a handicapped toilet? Yes   TIMED UP AND GO:  Was the test performed? Yes .  Length of time to ambulate 10 feet: 4 sec.   Gait steady and fast without use of assistive device  Cognitive Function:        05/26/2020    2:04 PM  6CIT Screen  What Year? 0 points  What month? 0 points  What time? 0 points  Count back from 20 0 points  Months in reverse 0 points  Repeat phrase 4 points  Total Score 4 points    Immunizations Immunization History  Administered Date(s) Administered   Fluad Quad(high Dose 65+) 11/30/2018, 12/20/2019   Influenza, High Dose Seasonal PF 12/26/2016, 12/23/2017   Influenza-Unspecified 12/03/2014   Pneumococcal Conjugate-13 03/09/2014   Pneumococcal Polysaccharide-23 04/06/2015   Tdap 04/06/2015   Zoster Recombinat (Shingrix) 01/14/2019, 03/16/2019   Zoster, Live 03/05/2011    TDAP status: Due, Education has been provided regarding the importance of this vaccine. Advised may receive this vaccine at local pharmacy or Health Dept. Aware to provide a copy of the vaccination record if obtained from local pharmacy or  Health Dept. Verbalized acceptance and understanding.  Flu Vaccine status: Declined, Education has been provided regarding the importance of this vaccine but patient still declined. Advised may receive this vaccine at local pharmacy or Health Dept. Aware to provide a copy of the vaccination record if obtained from local pharmacy or Health Dept. Verbalized acceptance and  understanding.  Pneumococcal vaccine status: Declined,  Education has been provided regarding the importance of this vaccine but patient still declined. Advised may receive this vaccine at local pharmacy or Health Dept. Aware to provide a copy of the vaccination record if obtained from local pharmacy or Health Dept. Verbalized acceptance and understanding.   Covid-19 vaccine status: Completed vaccines  Qualifies for Shingles Vaccine? Yes   Zostavax completed Yes   Shingrix Completed?: Yes  Screening Tests Health Maintenance  Topic Date Due   COVID-19 Vaccine (1) Never done   INFLUENZA VACCINE  10/02/2021   TETANUS/TDAP  04/05/2025   COLONOSCOPY (Pts 45-26yrs Insurance coverage will need to be confirmed)  12/13/2027   Pneumonia Vaccine 20+ Years old  Completed   Hepatitis C Screening  Completed   Zoster Vaccines- Shingrix  Completed   HPV VACCINES  Aged Out    Health Maintenance  Health Maintenance Due  Topic Date Due   COVID-19 Vaccine (1) Never done    Colorectal cancer screening: Type of screening: Colonoscopy. Completed 12/12/17. Repeat every 10 years  Lung Cancer Screening: (Low Dose CT Chest recommended if Age 72-80 years, 30 pack-year currently smoking OR have quit w/in 15years.) does not qualify.   Additional Screening:  Hepatitis C Screening: does qualify; Completed 06/02/17  Vision Screening: Recommended annual ophthalmology exams for early detection of glaucoma and other disorders of the eye. Is the patient up to date with their annual eye exam?  Yes  Who is the provider or what is the name of the office in which the patient attends annual eye exams? [facility] If pt is not established with a provider, would they like to be referred to a provider to establish care? No .   Dental Screening: Recommended annual dental exams for proper oral hygiene  Community Resource Referral / Chronic Care Management: CRR required this visit?  No   CCM required this  visit?  No      Plan:     I have personally reviewed and noted the following in the patients chart:   Medical and social history Use of alcohol, tobacco or illicit drugs  Current medications and supplements including opioid prescriptions. Patient is currently taking opioid prescriptions. Information provided to patient regarding non-opioid alternatives. Patient advised to discuss non-opioid treatment plan with their provider. Functional ability and status Nutritional status Physical activity Advanced directives List of other physicians Hospitalizations, surgeries, and ER visits in previous 12 months Vitals Screenings to include cognitive, depression, and falls Referrals and appointments  In addition, I have reviewed and discussed with patient certain preventive protocols, quality metrics, and best practice recommendations. A written personalized care plan for preventive services as well as general preventive health recommendations were provided to patient.     Jhonnie GORMAN Das, LPN   [id number]   Nurse Notes: none    "

## 2021-06-28 NOTE — Patient Instructions (Signed)
 Rodney Hall , Thank you for taking time to come for your Medicare Wellness Visit. I appreciate your ongoing commitment to your health goals. Please review the following plan we discussed and let me know if I can assist you in the future.   Screening recommendations/referrals: Colonoscopy: 12/12/17 Recommended yearly ophthalmology/optometry visit for glaucoma screening and checkup Recommended yearly dental visit for hygiene and checkup  Vaccinations: Influenza vaccine: n/d Pneumococcal vaccine: n/d Tdap vaccine: n/d Shingles vaccine: Zostavax 03/05/11  Shingrix 01/14/19, 03/16/19   Covid-19: states has had 3 shots, doesn't have vaccine card with him  Advanced directives: no  Conditions/risks identified: none  Next appointment: Follow up in one year for your annual wellness visit. 07/03/22 @ 10:30am in person  Preventive Care 65 Years and Older, Male Preventive care refers to lifestyle choices and visits with your health care provider that can promote health and wellness. What does preventive care include? A yearly physical exam. This is also called an annual well check. Dental exams once or twice a year. Routine eye exams. Ask your health care provider how often you should have your eyes checked. Personal lifestyle choices, including: Daily care of your teeth and gums. Regular physical activity. Eating a healthy diet. Avoiding tobacco and drug use. Limiting alcohol use. Practicing safe sex. Taking low doses of aspirin  every day. Taking vitamin and mineral supplements as recommended by your health care provider. What happens during an annual well check? The services and screenings done by your health care provider during your annual well check will depend on your age, overall health, lifestyle risk factors, and family history of disease. Counseling  Your health care provider may ask you questions about your: Alcohol use. Tobacco use. Drug use. Emotional well-being. Home and  relationship well-being. Sexual activity. Eating habits. History of falls. Memory and ability to understand (cognition). Work and work astronomer. Screening  You may have the following tests or measurements: Height, weight, and BMI. Blood pressure. Lipid and cholesterol levels. These may be checked every 5 years, or more frequently if you are over 64 years old. Skin check. Lung cancer screening. You may have this screening every year starting at age 76 if you have a 30-pack-year history of smoking and currently smoke or have quit within the past 15 years. Fecal occult blood test (FOBT) of the stool. You may have this test every year starting at age 76. Flexible sigmoidoscopy or colonoscopy. You may have a sigmoidoscopy every 5 years or a colonoscopy every 10 years starting at age 76. Prostate cancer screening. Recommendations will vary depending on your family history and other risks. Hepatitis C blood test. Hepatitis B blood test. Sexually transmitted disease (STD) testing. Diabetes screening. This is done by checking your blood sugar (glucose) after you have not eaten for a while (fasting). You may have this done every 1-3 years. Abdominal aortic aneurysm (AAA) screening. You may need this if you are a current or former smoker. Osteoporosis. You may be screened starting at age 76 if you are at high risk. Talk with your health care provider about your test results, treatment options, and if necessary, the need for more tests. Vaccines  Your health care provider may recommend certain vaccines, such as: Influenza vaccine. This is recommended every year. Tetanus, diphtheria, and acellular pertussis (Tdap, Td) vaccine. You may need a Td booster every 10 years. Zoster vaccine. You may need this after age 76. Pneumococcal 13-valent conjugate (PCV13) vaccine. One dose is recommended after age 76. Pneumococcal polysaccharide (PPSV23) vaccine.  One dose is recommended after age 76. Talk to your  health care provider about which screenings and vaccines you need and how often you need them. This information is not intended to replace advice given to you by your health care provider. Make sure you discuss any questions you have with your health care provider. Document Released: 03/17/2015 Document Revised: 11/08/2015 Document Reviewed: 12/20/2014 Elsevier Interactive Patient Education  2017 [employer].  Fall Prevention in the Home Falls can cause injuries. They can happen to people of all ages. There are many things you can do to make your home safe and to help prevent falls. What can I do on the outside of my home? Regularly fix the edges of walkways and driveways and fix any cracks. Remove anything that might make you trip as you walk through a door, such as a raised step or threshold. Trim any bushes or trees on the path to your home. Use bright outdoor lighting. Clear any walking paths of anything that might make someone trip, such as rocks or tools. Regularly check to see if handrails are loose or broken. Make sure that both sides of any steps have handrails. Any raised decks and porches should have guardrails on the edges. Have any leaves, snow, or ice cleared regularly. Use sand or salt on walking paths during winter. Clean up any spills in your garage right away. This includes oil or grease spills. What can I do in the bathroom? Use night lights. Install grab bars by the toilet and in the tub and shower. Do not use towel bars as grab bars. Use non-skid mats or decals in the tub or shower. If you need to sit down in the shower, use a plastic, non-slip stool. Keep the floor dry. Clean up any water  that spills on the floor as soon as it happens. Remove soap buildup in the tub or shower regularly. Attach bath mats securely with double-sided non-slip rug tape. Do not have throw rugs and other things on the floor that can make you trip. What can I do in the bedroom? Use night  lights. Make sure that you have a light by your bed that is easy to reach. Do not use any sheets or blankets that are too big for your bed. They should not hang down onto the floor. Have a firm chair that has side arms. You can use this for support while you get dressed. Do not have throw rugs and other things on the floor that can make you trip. What can I do in the kitchen? Clean up any spills right away. Avoid walking on wet floors. Keep items that you use a lot in easy-to-reach places. If you need to reach something above you, use a strong step stool that has a grab bar. Keep electrical cords out of the way. Do not use floor polish or wax that makes floors slippery. If you must use wax, use non-skid floor wax. Do not have throw rugs and other things on the floor that can make you trip. What can I do with my stairs? Do not leave any items on the stairs. Make sure that there are handrails on both sides of the stairs and use them. Fix handrails that are broken or loose. Make sure that handrails are as long as the stairways. Check any carpeting to make sure that it is firmly attached to the stairs. Fix any carpet that is loose or worn. Avoid having throw rugs at the top or bottom of  the stairs. If you do have throw rugs, attach them to the floor with carpet tape. Make sure that you have a light switch at the top of the stairs and the bottom of the stairs. If you do not have them, ask someone to add them for you. What else can I do to help prevent falls? Wear shoes that: Do not have high heels. Have rubber bottoms. Are comfortable and fit you well. Are closed at the toe. Do not wear sandals. If you use a stepladder: Make sure that it is fully opened. Do not climb a closed stepladder. Make sure that both sides of the stepladder are locked into place. Ask someone to hold it for you, if possible. Clearly mark and make sure that you can see: Any grab bars or handrails. First and last  steps. Where the edge of each step is. Use tools that help you move around (mobility aids) if they are needed. These include: Canes. Walkers. Scooters. Crutches. Turn on the lights when you go into a dark area. Replace any light bulbs as soon as they burn out. Set up your furniture so you have a clear path. Avoid moving your furniture around. If any of your floors are uneven, fix them. If there are any pets around you, be aware of where they are. Review your medicines with your doctor. Some medicines can make you feel dizzy. This can increase your chance of falling. Ask your doctor what other things that you can do to help prevent falls. This information is not intended to replace advice given to you by your health care provider. Make sure you discuss any questions you have with your health care provider. Document Released: 12/15/2008 Document Revised: 07/27/2015 Document Reviewed: 03/25/2014 Elsevier Interactive Patient Education  2017 [employer].

## 2021-07-03 DIAGNOSIS — M5416 Radiculopathy, lumbar region: Secondary | ICD-10-CM | POA: Diagnosis not present

## 2021-07-17 ENCOUNTER — Ambulatory Visit: Payer: PPO | Admitting: Dermatology

## 2021-07-17 DIAGNOSIS — L578 Other skin changes due to chronic exposure to nonionizing radiation: Secondary | ICD-10-CM | POA: Diagnosis not present

## 2021-07-17 DIAGNOSIS — L820 Inflamed seborrheic keratosis: Secondary | ICD-10-CM

## 2021-07-17 DIAGNOSIS — L570 Actinic keratosis: Secondary | ICD-10-CM | POA: Diagnosis not present

## 2021-07-17 NOTE — Patient Instructions (Signed)

## 2021-07-17 NOTE — Progress Notes (Signed)
Follow-Up Visit   Subjective  Rodney Hall. is a 76 y.o. male who presents for the following: Actinic Keratosis (6 months f/u ). The patient has spots, moles and lesions to be evaluated, some may be new or changing and the patient has concerns that these could be cancer.  The following portions of the chart were reviewed this encounter and updated as appropriate:   Tobacco  Allergies  Meds  Problems  Med Hx  Surg Hx  Fam Hx     Review of Systems:  No other skin or systemic complaints except as noted in HPI or Assessment and Plan.  Objective  Well appearing patient in no apparent distress; mood and affect are within normal limits.  A focused examination was performed including face. Relevant physical exam findings are noted in the Assessment and Plan.  face x 3, right dorsum hand x 1  (4) (4) Erythematous thin papules/macules with gritty scale.   left post auricular x 1, right neck x 1  (2) (2) Stuck-on, waxy, tan-brown papule or plaque --Discussed benign etiology and prognosis.    Assessment & Plan  AK (actinic keratosis) (4) face x 3, right dorsum hand x 1  (4)  Actinic keratoses are precancerous spots that appear secondary to cumulative UV radiation exposure/sun exposure over time. They are chronic with expected duration over 1 year. A portion of actinic keratoses will progress to squamous cell carcinoma of the skin. It is not possible to reliably predict which spots will progress to skin cancer and so treatment is recommended to prevent development of skin cancer.  Recommend daily broad spectrum sunscreen SPF 30+ to sun-exposed areas, reapply every 2 hours as needed.  Recommend staying in the shade or wearing long sleeves, sun glasses (UVA+UVB protection) and wide brim hats (4-inch brim around the entire circumference of the hat). Call for new or changing lesions.   Destruction of lesion - face x 3, right dorsum hand x 1  (4) Complexity: simple   Destruction  method: cryotherapy   Informed consent: discussed and consent obtained   Timeout:  patient name, date of birth, surgical site, and procedure verified Lesion destroyed using liquid nitrogen: Yes   Region frozen until ice ball extended beyond lesion: Yes   Outcome: patient tolerated procedure well with no complications   Post-procedure details: wound care instructions given    Inflamed seborrheic keratosis (2) left post auricular x 1, right neck x 1  (2)  Destruction of lesion - left post auricular x 1, right neck x 1  (2) Complexity: simple   Destruction method: cryotherapy   Informed consent: discussed and consent obtained   Timeout:  patient name, date of birth, surgical site, and procedure verified Lesion destroyed using liquid nitrogen: Yes   Region frozen until ice ball extended beyond lesion: Yes   Outcome: patient tolerated procedure well with no complications   Post-procedure details: wound care instructions given    Actinic Damage - chronic, secondary to cumulative UV radiation exposure/sun exposure over time - diffuse scaly erythematous macules with underlying dyspigmentation - Recommend daily broad spectrum sunscreen SPF 30+ to sun-exposed areas, reapply every 2 hours as needed.  - Recommend staying in the shade or wearing long sleeves, sun glasses (UVA+UVB protection) and wide brim hats (4-inch brim around the entire circumference of the hat). - Call for new or changing lesions.  Return in about 6 months (around 01/17/2022) for TBSE, hx of AKs, hx of ISK.  I, Marye Round, CMA,  am acting as scribe for Sarina Ser, MD .  Documentation: I have reviewed the above documentation for accuracy and completeness, and I agree with the above.  Sarina Ser, MD

## 2021-07-25 DIAGNOSIS — M545 Low back pain, unspecified: Secondary | ICD-10-CM | POA: Diagnosis not present

## 2021-07-28 ENCOUNTER — Encounter: Payer: Self-pay | Admitting: Dermatology

## 2021-10-08 NOTE — Progress Notes (Signed)
Complete physical exam  Rodney Hall,acting as a scribe for Rodney Huh, MD.,have documented all relevant documentation on the behalf of Rodney Huh, MD,as directed by  Rodney Huh, MD while in the presence of Rodney Huh, MD.   Patient: Rodney Hall.   DOB: 11/24/1945   77 y.o. Male  MRN: [id number] Visit Date: 10/09/2021  Today's healthcare provider: Lelon Huh, MD   Chief Complaint  Patient presents with   Annual Exam   Subjective    Rodney Hall. is a 76 y.o. male who presents today for a complete physical exam.  He reports consuming a general diet. Gym/ health club routine includes cardio, light weights, and treadmill. He generally feels well. He reports sleeping well. He does not have additional problems to discuss today. He sees Dr. Eliberto Ivory regularly where he has PSA checked. He is followed at [facility]PI  AWV done 06/28/2021.  Past Medical History:  Diagnosis Date   Abdominal mass    Abdominal pain 06/02/2017   Anxiety    Pt. denies   Arthritis    pt. 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   left above alar crease sidewall of nose/in situ   Past Surgical History:  Procedure Laterality Date   ANTERIOR LAT LUMBAR FUSION Right 07/30/2016   Procedure: Right Lateral two-three Lateral transpsoas interbody fusion with lateral plating/Minimally invasive decompression at Lumbar two-three;  Surgeon: Ditty, Kevan Ny, MD;  Location: [facility];  Service: Neurosurgery;  Laterality: Right;   BACK SURGERY  2012   CATARACT EXTRACTION, BILATERAL     CHOLECYSTECTOMY N/A 07/25/2017   Procedure: LAPAROSCOPIC CHOLECYSTECTOMY WITH  INTRAOPERATIVE CHOLANGIOGRAM;  Surgeon: Johnathan Hausen, MD;  Location: [facility];  Service: General;  Laterality: N/A;   COLONOSCOPY WITH PROPOFOL N/A 12/12/2017   Procedure: COLONOSCOPY WITH PROPOFOL;  Surgeon: Manya Silvas, MD;  Location: [facility] END[facility]COPY;  Service: Endoscopy;  Laterality: N/A;   EUS N/A 07/10/2017   Procedure: UPPER ENDOSCOPIC ULTRASOUND (EUS) LINEAR;  Surgeon: [name], MD;  Location: [facility];  Service: Endoscopy;  Laterality: N/A;   EYE SURGERY     HEMI-MICRODISCECTOMY LUMBAR LAMINECTOMY LEVEL 1  09/2018   [facility]   NECK SURGERY  2000   TENNIS ELBOW RELEASE/NIRSCHEL PROCEDURE     TONSILLECTOMY AND ADENOIDECTOMY     Social History   Socioeconomic History   Marital status: Married    Spouse name: Not on file   Number of children: Not on file   Years of education: Not on file   Highest education level: Not on file  Occupational History   Not on file  Tobacco Use   Smoking status: Never   Smokeless tobacco: Never  Vaping Use   Vaping Use: Never used  Substance and Sexual Activity   Alcohol use: No    Alcohol/week: 0.0 standard drinks of alcohol   Drug use: No   Sexual activity: Yes  Other Topics Concern   Not on file  Social History Narrative   Not on file   Social Determinants of Health   Financial Resource Strain: Low Risk  (  06/28/2021)   Overall Financial Resource Strain (CARDIA)    Difficulty of Paying Living Expenses: Not hard at all  Food Insecurity: No Food Insecurity (06/28/2021)   Hunger Vital Sign    Worried About Running Out of Food in the Last Year: Never true    Ran Out of Food in the Last Year: Never true  Transportation Needs: No Transportation Needs (06/28/2021)   PRAPARE - Hydrologist (Medical): No    Lack of Transportation (Non-Medical): No  Physical Activity: Sufficiently Active (06/28/2021)   Exercise Vital Sign    Days of Exercise per Week: 6 days    Minutes of Exercise per  Session: 60 min  Stress: No Stress Concern Present (06/28/2021)   [place]    Feeling of Stress : Not at all  Social Connections: Moderately Isolated (06/28/2021)   Social Connection and Isolation Panel [NHANES]    Frequency of Communication with Friends and Family: More than three times a week    Frequency of Social Gatherings with Friends and Family: Three times a week    Attends Religious Services: Never    Active Member of Clubs or Organizations: No    Attends Archivist Meetings: Never    Marital Status: Married  Human resources officer Violence: Not At Risk (06/28/2021)   Humiliation, Afraid, Rape, and Kick questionnaire    Fear of Current or Ex-Partner: No    Emotionally Abused: No    Physically Abused: No    Sexually Abused: No   Family Status  Relation Name Status   Mother  Deceased   Father  Deceased       old age   Sister  Deceased   Brother  Alive   Family History  Problem Relation Age of Onset   Cancer Mother        brain   Diabetes Father    Rheumatic fever Sister    Heart disease Sister    Diabetes Brother    Allergies  Allergen Reactions   Pravastatin     jittery   Amoxicillin Rash    ++ got ceftriaxone in the past++ Has patient had a PCN reaction causing immediate rash, facial/tongue/throat swelling, SOB or lightheadedness with hypotension: Yes Has patient had a PCN reaction causing severe rash involving mucus membranes or skin necrosis: No Has patient had a PCN reaction that required hospitalization No Has patient had a PCN reaction occurring within the last 10 years: No If all of the above answers are "NO", then may proceed with Cephalosporin use.      Patient Care Team: Birdie Sons, MD as PCP - General (Family Medicine) Royston Cowper, MD as Consulting Physician (Urology)   Medications: Outpatient Medications Prior to Visit  Medication Sig   acetaminophen (TYLENOL)  325 MG tablet Take 650 mg by mouth every 6 (six) hours as needed for moderate pain or headache.   naproxen sodium (ALEVE) 220 MG tablet Take 440 mg by mouth daily.   tamsulosin (FLOMAX) 0.4 MG CAPS capsule Take 0.4 mg by mouth daily after supper.    cyclobenzaprine (FLEXERIL) 5 MG tablet cyclobenzaprine 5 mg tablet  TAKE 1 TABLET BY MOUTH THREE TIMES DAILY (Patient not taking: Reported on 06/28/2021)   HYDROcodone-acetaminophen (NORCO/VICODIN) 5-325 MG tablet hydrocodone 5 mg-acetaminophen 325 mg tablet (Patient not taking: Reported on 10/09/2021)   hydrOXYzine (ATARAX/VISTARIL) 50 MG tablet Take 50 mg by mouth 3 (three) times daily as needed. (  Patient not taking: Reported on 10/09/2021)   imipramine (TOFRANIL) 50 MG tablet Take 50 mg by mouth at bedtime.  (Patient not taking: Reported on 10/09/2021)   mirabegron ER (MYRBETRIQ) 25 MG TB24 tablet Myrbetriq 25 mg tablet,extended release (Patient not taking: Reported on 06/28/2021)   neomycin-polymyxin-hydrocortisone (CORTISPORIN) 3.5-10000-1 OTIC suspension Place 3 drops into the left ear 4 (four) times daily. (Patient not taking: Reported on 06/28/2021)   oxyCODONE (OXY IR/ROXICODONE) 5 MG immediate release tablet oxycodone 5 mg tablet  TAKE 1 TABLET BY MOUTH EVERY 12 HOURS AS NEEDED FOR PAIN (Patient not taking: Reported on 06/28/2021)   pentosan polysulfate (ELMIRON) 100 MG capsule Take 100 mg by mouth 3 (three) times daily.  (Patient not taking: Reported on 06/28/2021)   sodium chloride (OCEAN) 0.65 % SOLN nasal spray Place 1 spray into both nostrils as needed for congestion. (Patient not taking: Reported on 10/09/2021)   valACYclovir (VALTREX) 1000 MG tablet valacyclovir 1 gram tablet (Patient not taking: Reported on 10/09/2021)   No facility-administered medications prior to visit.    Review of Systems  Eyes:  Positive for pain.  Musculoskeletal:  Positive for back pain.  All other systems reviewed and are negative.     Objective     BP 114/78 (BP  Location: Right Arm, Patient Position: Sitting, Cuff Size: Normal)   Pulse 85   Resp 16   Ht '5\' 11"'$  (1.803 m)   Wt 205 lb (93 kg)   SpO2 97%   BMI 28.59 kg/m     Physical Exam   General Appearance:    Well developed, well nourished male. Alert, cooperative, in no acute distress, appears stated age  Head:    Normocephalic, without obvious abnormality, atraumatic  Eyes:    PERRL, conjunctiva/corneas clear, EOM's intact, fundi    benign, both eyes       Ears:    Normal TM's and external ear canals, both ears  Nose:   Nares normal, septum midline, mucosa normal, no drainage   or sinus tenderness  Throat:   Lips, mucosa, and tongue normal; teeth and gums normal  Neck:   Supple, symmetrical, trachea midline, no adenopathy;       thyroid:  No enlargement/tenderness/nodules; no carotid   bruit or JVD  Back:     Symmetric, no curvature, ROM normal, no CVA tenderness  Lungs:     Clear to auscultation bilaterally, respirations unlabored  Chest wall:    No tenderness or deformity  Heart:    Normal heart rate. Normal rhythm. No murmurs, rubs, or gallops.  S1 and S2 normal  Abdomen:     Soft, non-tender, bowel sounds active all four quadrants,    no masses, no organomegaly  Genitalia:    deferred  Rectal:    deferred  Extremities:   All extremities are intact. No cyanosis or edema  Pulses:   2+ and symmetric all extremities  Skin:   Skin color, texture, turgor normal, no rashes or lesions  Lymph nodes:   Cervical, supraclavicular, and axillary nodes normal  Neurologic:   CNII-XII intact. Normal strength, sensation and reflexes      throughout     Last depression screening scores    10/09/2021    1:46 PM 06/28/2021   10:12 AM 05/26/2020    2:04 PM  PHQ 2/9 Scores  PHQ - 2 Score 0 0 0  PHQ- 9 Score 0  0   Last fall risk screening    10/09/2021    1:44  PM  [place] in the past year? 0  Number falls in past yr: 0  Injury with Fall? 0  Risk for fall due to : No Fall Risks   Follow up Falls evaluation completed   Last Audit-C alcohol use screening    10/09/2021    1:44 PM  Alcohol Use Disorder Test (AUDIT)  1. How often do you have a drink containing alcohol? 0  2. How many drinks containing alcohol do you have on a typical day when you are drinking? 0  3. How often do you have six or more drinks on one occasion? 0  AUDIT-C Score 0   A score of 3 or more in women, and 4 or more in men indicates increased risk for alcohol abuse, EXCEPT if all of the points are from question 1   No results found for any visits on 10/09/21.  Assessment & Plan    Routine Health Maintenance and Physical Exam  Exercise Activities and Dietary recommendations  Goals      DIET - EAT MORE FRUITS AND VEGETABLES        Immunization History  Administered Date(s) Administered   Fluad Quad(high Dose 65+) 11/30/2018, 12/20/2019   Influenza, High Dose Seasonal PF 12/26/2016, 12/23/2017   Influenza-Unspecified 12/03/2014   Pneumococcal Conjugate-13 03/09/2014   Pneumococcal Polysaccharide-23 04/06/2015   Tdap 04/06/2015   Zoster Recombinat (Shingrix) 01/14/2019, 03/16/2019   Zoster, Live 03/05/2011    Health Maintenance  Topic Date Due   COVID-19 Vaccine (1) Never done   INFLUENZA VACCINE  10/02/2021   TETANUS/TDAP  04/05/2025   Pneumonia Vaccine 21+ Years old  Completed   Hepatitis C Screening  Completed   Zoster Vaccines- Shingrix  Completed   HPV VACCINES  Aged Out   COLONOSCOPY (Pts 45-51yr Insurance coverage will need to be confirmed)  Discontinued    Discussed health benefits of physical activity, and encouraged him to engage in regular exercise appropriate for his age and condition.  Prostate cancer screening deferred to Dr. WEliberto Ivory   2. Prediabetes  - Hemoglobin A1c  3. Hypertriglyceridemia Diet controlled.  - Comprehensive metabolic panel - CBC - Lipid panel  4. History of colon polyps Diminutive polyps excised in 2014 by Dr. ETiffany Kocher He's not sure  if he wants to do any follow up colonoscopies at his age.   5. Prostate cancer screening Deferred to Dr. WRogers Blocker       The entirety of the information documented in the History of Present Illness, Review of Systems and Physical Exam were personally obtained by me. Portions of this information were initially documented by the CMA and reviewed by me for thoroughness and accuracy.     DLelon Huh MD  [phone](phone) [phone](fax)  [place]

## 2021-10-09 ENCOUNTER — Ambulatory Visit (INDEPENDENT_AMBULATORY_CARE_PROVIDER_SITE_OTHER): Payer: PPO | Admitting: Family Medicine

## 2021-10-09 ENCOUNTER — Encounter: Payer: Self-pay | Admitting: Family Medicine

## 2021-10-09 VITALS — BP 114/78 | HR 85 | Resp 16 | Ht 71.0 in | Wt 205.0 lb

## 2021-10-09 DIAGNOSIS — Z860100 Personal history of colon polyps, unspecified: Secondary | ICD-10-CM

## 2021-10-09 DIAGNOSIS — Z125 Encounter for screening for malignant neoplasm of prostate: Secondary | ICD-10-CM | POA: Diagnosis not present

## 2021-10-09 DIAGNOSIS — R7303 Prediabetes: Secondary | ICD-10-CM

## 2021-10-09 DIAGNOSIS — Z0000 Encounter for general adult medical examination without abnormal findings: Secondary | ICD-10-CM | POA: Diagnosis not present

## 2021-10-09 DIAGNOSIS — E781 Pure hyperglyceridemia: Secondary | ICD-10-CM

## 2021-10-10 ENCOUNTER — Ambulatory Visit: Payer: Self-pay | Admitting: *Deleted

## 2021-10-10 DIAGNOSIS — R7303 Prediabetes: Secondary | ICD-10-CM | POA: Diagnosis not present

## 2021-10-10 DIAGNOSIS — E781 Pure hyperglyceridemia: Secondary | ICD-10-CM | POA: Diagnosis not present

## 2021-10-10 NOTE — Patient Instructions (Signed)
Visit Information  Thank you for taking time to visit with me today. Please don't hesitate to contact me if I can be of assistance to you.   Following are the goals we discussed today:   Goals Addressed             This Visit's Progress    care coordination activities       Care Coordination Interventions:   SDOH screening completed Care Management Program introduced         Please call the care guide team at [phone] if you need to cancel or reschedule your appointment.   If you are experiencing a Mental Health or [place] or need someone to talk to, please call the Suicide and Crisis Lifeline: 988   Patient verbalizes understanding of instructions and care plan provided today and agrees to view in [place]. Active MyChart status and patient understanding of how to access instructions and care plan via MyChart confirmed with patient.     No further follow up required, patient declined additional outreach  [place], [place] Worker  [facility] Care Management [phone]

## 2021-10-10 NOTE — Patient Outreach (Addendum)
  Care Coordination   Initial Visit Note   10/10/2021 Name: Rodney Hall. MRN: [id number] DOB: 04-Aug-1945  Rodney Hall. is a 76 y.o. year old male who sees Fisher, Kirstie Peri, MD for primary care. I spoke with  Rodney Hall. by phone today  What matters to the patients health and wellness today?  Patient states that he has no follow up needs at this time    Goals Addressed             This Visit's Progress    care coordination activities       Care Coordination Interventions:   SDOH screening completed Confirmed AWV scheduled for 07/23/22 Care Management Program introduced        SDOH assessments and interventions completed:  Yes  SDOH Interventions Today    Flowsheet Row Most Recent Value  SDOH Interventions   Food Insecurity Interventions Intervention Not Indicated  Financial Strain Interventions Intervention Not Indicated  Housing Interventions Intervention Not Indicated  Physical Activity Interventions Intervention Not Indicated  Transportation Interventions Intervention Not Indicated        Care Coordination Interventions Activated:  Yes  Care Coordination Interventions:  Yes, provided   Follow up plan: No further intervention required.   Encounter Outcome:  Pt. Visit Completed

## 2021-10-11 ENCOUNTER — Encounter: Payer: Self-pay | Admitting: Family Medicine

## 2021-10-11 DIAGNOSIS — N1831 Chronic kidney disease, stage 3a: Secondary | ICD-10-CM | POA: Insufficient documentation

## 2021-10-11 DIAGNOSIS — N1832 Chronic kidney disease, stage 3b: Secondary | ICD-10-CM | POA: Insufficient documentation

## 2021-10-11 LAB — COMPREHENSIVE METABOLIC PANEL WITH GFR
ALT: 13 [IU]/L (ref 0–44)
AST: 17 [IU]/L (ref 0–40)
Albumin/Globulin Ratio: 1.4 (ref 1.2–2.2)
Albumin: 4.3 g/dL (ref 3.8–4.8)
Alkaline Phosphatase: 77 [IU]/L (ref 44–121)
BUN/Creatinine Ratio: 8 — ABNORMAL LOW (ref 10–24)
BUN: 12 mg/dL (ref 8–27)
Bilirubin Total: 0.4 mg/dL (ref 0.0–1.2)
CO2: 24 mmol/L (ref 20–29)
Calcium: 9.5 mg/dL (ref 8.6–10.2)
Chloride: 101 mmol/L (ref 96–106)
Creatinine, Ser: 1.52 mg/dL — ABNORMAL HIGH (ref 0.76–1.27)
Globulin, Total: 3 g/dL (ref 1.5–4.5)
Glucose: 116 mg/dL — ABNORMAL HIGH (ref 70–99)
Potassium: 5 mmol/L (ref 3.5–5.2)
Sodium: 139 mmol/L (ref 134–144)
Total Protein: 7.3 g/dL (ref 6.0–8.5)
eGFR: 47 mL/min/{1.73_m2} — ABNORMAL LOW

## 2021-10-11 LAB — HEMOGLOBIN A1C
Est. average glucose Bld gHb Est-mCnc: 140 mg/dL
Hgb A1c MFr Bld: 6.5 % — ABNORMAL HIGH (ref 4.8–5.6)

## 2021-10-11 LAB — CBC
Hematocrit: 45.5 % (ref 37.5–51.0)
Hemoglobin: 15.6 g/dL (ref 13.0–17.7)
MCH: 30.5 pg (ref 26.6–33.0)
MCHC: 34.3 g/dL (ref 31.5–35.7)
MCV: 89 fL (ref 79–97)
Platelets: 259 10*3/uL (ref 150–450)
RBC: 5.11 x10E6/uL (ref 4.14–5.80)
RDW: 12.2 % (ref 11.6–15.4)
WBC: 7.9 10*3/uL (ref 3.4–10.8)

## 2021-10-11 LAB — LIPID PANEL
Chol/HDL Ratio: 6.3 ratio — ABNORMAL HIGH (ref 0.0–5.0)
Cholesterol, Total: 184 mg/dL (ref 100–199)
HDL: 29 mg/dL — ABNORMAL LOW
LDL Chol Calc (NIH): 103 mg/dL — ABNORMAL HIGH (ref 0–99)
Triglycerides: 305 mg/dL — ABNORMAL HIGH (ref 0–149)
VLDL Cholesterol Cal: 52 mg/dL — ABNORMAL HIGH (ref 5–40)

## 2021-10-19 ENCOUNTER — Telehealth: Payer: Self-pay | Admitting: Family Medicine

## 2021-10-19 NOTE — Telephone Encounter (Signed)
Kidney functions have deteriorated somewhat since last year. Try to drink 2-3 additional glasses of water every day, and avoid taking over the counter anti-inflammatory medication, such ibuprofen and naproxen (Aleve). Tylenol is ok, it does not affect the kidneys at all.  Average blood sugar is 140 which is borderline for diabetes. Cut back on sweets and starchy foods as much as possible, and get plenty of exercise... 30 minutes a day is the recommendation.  Please schedule a follow up office visit in 4-5 months to check on sugar and kidneys.

## 2021-10-19 NOTE — Telephone Encounter (Signed)
Pt advised and voices understanding.  Follow up appt scheduled.

## 2021-11-07 DIAGNOSIS — J40 Bronchitis, not specified as acute or chronic: Secondary | ICD-10-CM | POA: Diagnosis not present

## 2021-11-07 DIAGNOSIS — N1831 Chronic kidney disease, stage 3a: Secondary | ICD-10-CM | POA: Diagnosis not present

## 2021-11-21 DIAGNOSIS — Z79899 Other long term (current) drug therapy: Secondary | ICD-10-CM | POA: Diagnosis not present

## 2021-11-21 DIAGNOSIS — Z125 Encounter for screening for malignant neoplasm of prostate: Secondary | ICD-10-CM | POA: Diagnosis not present

## 2021-11-21 DIAGNOSIS — N3010 Interstitial cystitis (chronic) without hematuria: Secondary | ICD-10-CM | POA: Diagnosis not present

## 2021-11-21 DIAGNOSIS — N5201 Erectile dysfunction due to arterial insufficiency: Secondary | ICD-10-CM | POA: Diagnosis not present

## 2021-11-21 DIAGNOSIS — N3281 Overactive bladder: Secondary | ICD-10-CM | POA: Diagnosis not present

## 2021-11-21 DIAGNOSIS — N401 Enlarged prostate with lower urinary tract symptoms: Secondary | ICD-10-CM | POA: Diagnosis not present

## 2022-01-17 DIAGNOSIS — M5416 Radiculopathy, lumbar region: Secondary | ICD-10-CM | POA: Diagnosis not present

## 2022-01-21 ENCOUNTER — Ambulatory Visit: Payer: PPO | Admitting: Dermatology

## 2022-01-21 DIAGNOSIS — Z8589 Personal history of malignant neoplasm of other organs and systems: Secondary | ICD-10-CM

## 2022-01-21 DIAGNOSIS — L814 Other melanin hyperpigmentation: Secondary | ICD-10-CM

## 2022-01-21 DIAGNOSIS — L570 Actinic keratosis: Secondary | ICD-10-CM | POA: Diagnosis not present

## 2022-01-21 DIAGNOSIS — I781 Nevus, non-neoplastic: Secondary | ICD-10-CM | POA: Diagnosis not present

## 2022-01-21 DIAGNOSIS — D485 Neoplasm of uncertain behavior of skin: Secondary | ICD-10-CM | POA: Diagnosis not present

## 2022-01-21 DIAGNOSIS — L578 Other skin changes due to chronic exposure to nonionizing radiation: Secondary | ICD-10-CM

## 2022-01-21 DIAGNOSIS — L821 Other seborrheic keratosis: Secondary | ICD-10-CM | POA: Diagnosis not present

## 2022-01-21 DIAGNOSIS — D229 Melanocytic nevi, unspecified: Secondary | ICD-10-CM | POA: Diagnosis not present

## 2022-01-21 DIAGNOSIS — Z1283 Encounter for screening for malignant neoplasm of skin: Secondary | ICD-10-CM

## 2022-01-21 DIAGNOSIS — Z85828 Personal history of other malignant neoplasm of skin: Secondary | ICD-10-CM | POA: Diagnosis not present

## 2022-01-21 NOTE — Patient Instructions (Signed)
Cryotherapy Aftercare  Wash gently with soap and water everyday.   Apply Vaseline and Band-Aid daily until healed.  Wound Care Instructions  Cleanse wound gently with soap and water once a day then pat dry with clean gauze. Apply a thin coat of Petrolatum (petroleum jelly, "Vaseline") over the wound (unless you have an allergy to this). We recommend that you use a new, sterile tube of Vaseline. Do not pick or remove scabs. Do not remove the yellow or white "healing tissue" from the base of the wound.  Cover the wound with fresh, clean, nonstick gauze and secure with paper tape. You may use Band-Aids in place of gauze and tape if the wound is small enough, but would recommend trimming much of the tape off as there is often too much. Sometimes Band-Aids can irritate the skin.  You should call the office for your biopsy report after 1 week if you have not already been contacted.  If you experience any problems, such as abnormal amounts of bleeding, swelling, significant bruising, significant pain, or evidence of infection, please call the office immediately.  FOR ADULT SURGERY PATIENTS: If you need something for pain relief you may take 1 extra strength Tylenol (acetaminophen) AND 2 Ibuprofen (200mg each) together every 4 hours as needed for pain. (do not take these if you are allergic to them or if you have a reason you should not take them.) Typically, you may only need pain medication for 1 to 3 days.      Due to recent changes in healthcare laws, you may see results of your pathology and/or laboratory studies on MyChart before the doctors have had a chance to review them. We understand that in some cases there may be results that are confusing or concerning to you. Please understand that not all results are received at the same time and often the doctors may need to interpret multiple results in order to provide you with the best plan of care or course of treatment. Therefore, we ask that you  please give us 2 business days to thoroughly review all your results before contacting the office for clarification. Should we see a critical lab result, you will be contacted sooner.   If You Need Anything After Your Visit  If you have any questions or concerns for your doctor, please call our main line at [phone] and press option 4 to reach your doctor's medical assistant. If no one answers, please leave a voicemail as directed and we will return your call as soon as possible. Messages left after 4 pm will be answered the following business day.   You may also send us a message via MyChart. We typically respond to MyChart messages within 1-2 business days.  For prescription refills, please ask your pharmacy to contact our office. Our fax number is [phone].  If you have an urgent issue when the clinic is closed that cannot wait until the next business day, you can page your doctor at the number below.    Please note that while we do our best to be available for urgent issues outside of office hours, we are not available 24/7.   If you have an urgent issue and are unable to reach us, you may choose to seek medical care at your doctor's office, retail clinic, urgent care center, or emergency room.  If you have a medical emergency, please immediately call 911 or go to the emergency department.  Pager Numbers  - Dr. Kowalski: [phone]  -   Dr. Moye: [phone]  - Dr. Stewart: [phone]  In the event of inclement weather, please call our main line at [phone] for an update on the status of any delays or closures.  Dermatology Medication Tips: Please keep the boxes that topical medications come in in order to help keep track of the instructions about where and how to use these. Pharmacies typically print the medication instructions only on the boxes and not directly on the medication tubes.   If your medication is too expensive, please contact our office at  [phone] option 4 or send us a message through MyChart.   We are unable to tell what your co-pay for medications will be in advance as this is different depending on your insurance coverage. However, we may be able to find a substitute medication at lower cost or fill out paperwork to get insurance to cover a needed medication.   If a prior authorization is required to get your medication covered by your insurance company, please allow us 1-2 business days to complete this process.  Drug prices often vary depending on where the prescription is filled and some pharmacies may offer cheaper prices.  The website www.goodrx.com contains coupons for medications through different pharmacies. The prices here do not account for what the cost may be with help from insurance (it may be cheaper with your insurance), but the website can give you the price if you did not use any insurance.  - You can print the associated coupon and take it with your prescription to the pharmacy.  - You may also stop by our office during regular business hours and pick up a GoodRx coupon card.  - If you need your prescription sent electronically to a different pharmacy, notify our office through [place] MyChart or by phone at [phone] option 4.     Si Usted Necesita Algo Despus de Su Visita  Tambin puede enviarnos un mensaje a travs de MyChart. Por lo general respondemos a los mensajes de MyChart en el transcurso de 1 a 2 das hbiles.  Para renovar recetas, por favor pida a su farmacia que se ponga en contacto con nuestra oficina. Nuestro nmero de fax es el [phone].  Si tiene un asunto urgente cuando la clnica est cerrada y que no puede esperar hasta el siguiente da hbil, puede llamar/localizar a su doctor(a) al nmero que aparece a continuacin.   Por favor, tenga en cuenta que aunque hacemos todo lo posible para estar disponibles para asuntos urgentes fuera del horario de oficina, no estamos  disponibles las 24 horas del da, los 7 das de la semana.   Si tiene un problema urgente y no puede comunicarse con nosotros, puede optar por buscar atencin mdica  en el consultorio de su doctor(a), en una clnica privada, en un centro de atencin urgente o en una sala de emergencias.  Si tiene una emergencia mdica, por favor llame inmediatamente al 911 o vaya a la sala de emergencias.  Nmeros de bper  - Dr. Kowalski: [phone]  - Dra. Moye: [phone]  - Dra. Stewart: [phone]  En caso de inclemencias del tiempo, por favor llame a nuestra lnea principal al [phone] para una actualizacin sobre el estado de cualquier retraso o cierre.  Consejos para la medicacin en dermatologa: Por favor, guarde las cajas en las que vienen los medicamentos de uso tpico para ayudarle a seguir las instrucciones sobre dnde y cmo usarlos. Las farmacias generalmente imprimen las instrucciones del medicamento slo en las cajas y   no directamente en los tubos del medicamento.   Si su medicamento es muy caro, por favor, pngase en contacto con nuestra oficina llamando al [phone] y presione la opcin 4 o envenos un mensaje a travs de MyChart.   No podemos decirle cul ser su copago por los medicamentos por adelantado ya que esto es diferente dependiendo de la cobertura de su seguro. Sin embargo, es posible que podamos encontrar un medicamento sustituto a menor costo o llenar un formulario para que el seguro cubra el medicamento que se considera necesario.   Si se requiere una autorizacin previa para que su compaa de seguros cubra su medicamento, por favor permtanos de 1 a 2 das hbiles para completar este proceso.  Los precios de los medicamentos varan con frecuencia dependiendo del lugar de dnde se surte la receta y alguna farmacias pueden ofrecer precios ms baratos.  El sitio web www.goodrx.com tiene cupones para medicamentos de diferentes farmacias. Los precios aqu no  tienen en cuenta lo que podra costar con la ayuda del seguro (puede ser ms barato con su seguro), pero el sitio web puede darle el precio si no utiliz ningn seguro.  - Puede imprimir el cupn correspondiente y llevarlo con su receta a la farmacia.  - Tambin puede pasar por nuestra oficina durante el horario de atencin regular y recoger una tarjeta de cupones de GoodRx.  - Si necesita que su receta se enve electrnicamente a una farmacia diferente, informe a nuestra oficina a travs de MyChart de [place] o por telfono llamando al [phone] y presione la opcin 4.  

## 2022-01-21 NOTE — Progress Notes (Signed)
Follow-Up Visit   Subjective  Rodney Hall. is a 76 y.o. male who presents for the following: Annual Exam (History of SCC, BCC and AK - The patient presents for Total-Body Skin Exam (TBSE) for skin cancer screening and mole check.  The patient has spots, moles and lesions to be evaluated, some may be new or changing and the patient has concerns that these could be cancer./).  The following portions of the chart were reviewed this encounter and updated as appropriate:   Tobacco  Allergies  Meds  Problems  Med Hx  Surg Hx  Fam Hx     Review of Systems:  No other skin or systemic complaints except as noted in HPI or Assessment and Plan.  Objective  Well appearing patient in no apparent distress; mood and affect are within normal limits.  A full examination was performed including scalp, head, eyes, ears, nose, lips, neck, chest, axillae, abdomen, back, buttocks, bilateral upper extremities, bilateral lower extremities, hands, feet, fingers, toes, fingernails, and toenails. All findings within normal limits unless otherwise noted below.  Neck - Posterior 0.6 cm pink papule  Face/ears (17) Erythematous thin papules/macules with gritty scale.    Assessment & Plan   History of Basal Cell Carcinoma of the Skin - No evidence of recurrence today - Recommend regular full body skin exams - Recommend daily broad spectrum sunscreen SPF 30+ to sun-exposed areas, reapply every 2 hours as needed.  - Call if any new or changing lesions are noted between office visits  History of Squamous Cell Carcinoma of the Skin - No evidence of recurrence today - No lymphadenopathy - Recommend regular full body skin exams - Recommend daily broad spectrum sunscreen SPF 30+ to sun-exposed areas, reapply every 2 hours as needed.  - Call if any new or changing lesions are noted between office visits  Lentigines - Scattered tan macules - Due to sun exposure - Benign-appearing, observe -  Recommend daily broad spectrum sunscreen SPF 30+ to sun-exposed areas, reapply every 2 hours as needed. - Call for any changes  Seborrheic Keratoses - Stuck-on, waxy, tan-brown papules and/or plaques  - Benign-appearing - Discussed benign etiology and prognosis. - Observe - Call for any changes  Melanocytic Nevi - Tan-brown and/or pink-flesh-colored symmetric macules and papules - Benign appearing on exam today - Observation - Call clinic for new or changing moles - Recommend daily use of broad spectrum spf 30+ sunscreen to sun-exposed areas.   Hemangiomas - Red papules - Discussed benign nature - Observe - Call for any changes  Actinic Damage - Chronic condition, secondary to cumulative UV/sun exposure - diffuse scaly erythematous macules with underlying dyspigmentation - Recommend daily broad spectrum sunscreen SPF 30+ to sun-exposed areas, reapply every 2 hours as needed.  - Staying in the shade or wearing long sleeves, sun glasses (UVA+UVB protection) and wide brim hats (4-inch brim around the entire circumference of the hat) are also recommended for sun protection.  - Call for new or changing lesions.  Skin cancer screening performed today.  Neoplasm of uncertain behavior of skin Neck - Posterior  Epidermal / dermal shaving  Lesion diameter (cm):  0.6 Informed consent: discussed and consent obtained   Timeout: patient name, date of birth, surgical site, and procedure verified   Procedure prep:  Patient was prepped and draped in usual sterile fashion Prep type:  Isopropyl alcohol Anesthesia: the lesion was anesthetized in a standard fashion   Anesthetic:  1% lidocaine w/ epinephrine 1-100,000 buffered  w/ 8.4% NaHCO3 Instrument used: flexible razor blade   Hemostasis achieved with: pressure, aluminum chloride and electrodesiccation   Outcome: patient tolerated procedure well   Post-procedure details: sterile dressing applied and wound care instructions given    Dressing type: bandage and petrolatum    Destruction of lesion Complexity: extensive   Destruction method: electrodesiccation and curettage   Informed consent: discussed and consent obtained   Timeout:  patient name, date of birth, surgical site, and procedure verified Procedure prep:  Patient was prepped and draped in usual sterile fashion Prep type:  Isopropyl alcohol Anesthesia: the lesion was anesthetized in a standard fashion   Anesthetic:  1% lidocaine w/ epinephrine 1-100,000 buffered w/ 8.4% NaHCO3 Curettage performed in three different directions: Yes   Electrodesiccation performed over the curetted area: Yes   Lesion length (cm):  0.6 Lesion width (cm):  0.6 Margin per side (cm):  0.2 Final wound size (cm):  1 Hemostasis achieved with:  pressure and aluminum chloride Outcome: patient tolerated procedure well with no complications   Post-procedure details: sterile dressing applied and wound care instructions given   Dressing type: bandage and petrolatum    Specimen 1 - Surgical pathology Differential Diagnosis: R/O BCC Check Margins: No EDC today  AK (actinic keratosis) (17) Face/ears  Destruction of lesion - Face/ears Complexity: simple   Destruction method: cryotherapy   Informed consent: discussed and consent obtained   Timeout:  patient name, date of birth, surgical site, and procedure verified Lesion destroyed using liquid nitrogen: Yes   Region frozen until ice ball extended beyond lesion: Yes   Outcome: patient tolerated procedure well with no complications   Post-procedure details: wound care instructions given     Return in about 6 months (around 07/22/2022) for UBSE.  I, Ashok Cordia, CMA, am acting as scribe for Sarina Ser, MD . Documentation: I have reviewed the above documentation for accuracy and completeness, and I agree with the above.  Sarina Ser, MD

## 2022-01-29 ENCOUNTER — Telehealth: Payer: Self-pay

## 2022-01-29 NOTE — Telephone Encounter (Signed)
Advised pt of bx results/sh ?

## 2022-01-29 NOTE — Telephone Encounter (Signed)
-----   Message from Ralene Bathe, MD sent at 01/29/2022  4:53 PM EST ----- Diagnosis Skin , neck - posterior NODULAR SOLAR ELASTOSIS AND TELANGIECTASIA  Benign sun damage changes No further treatment needed

## 2022-01-31 DIAGNOSIS — M545 Low back pain, unspecified: Secondary | ICD-10-CM | POA: Diagnosis not present

## 2022-02-06 ENCOUNTER — Encounter: Payer: Self-pay | Admitting: Dermatology

## 2022-02-11 DIAGNOSIS — H903 Sensorineural hearing loss, bilateral: Secondary | ICD-10-CM | POA: Diagnosis not present

## 2022-02-20 NOTE — Progress Notes (Signed)
I,April Miller,acting as a scribe for Lelon Huh, MD.,have documented all relevant documentation on the behalf of Lelon Huh, MD,as directed by  Lelon Huh, MD while in the presence of Lelon Huh, MD.   Established patient visit   Patient: Rodney Hall.   DOB: 1945-05-07   76 y.o. Male  MRN: [id number] Visit Date: 02/22/2022  Today's healthcare provider: Lelon Huh, MD   Chief Complaint  Patient presents with   Hyperlipidemia   Prediabetes   Subjective    HPI  Lipid/Cholesterol, Follow-up  Last lipid panel Other pertinent labs  Lab Results  Component Value Date   CHOL 184 10/10/2021   HDL 29 (L) 10/10/2021   LDLCALC 103 (H) 10/10/2021   TRIG 305 (H) 10/10/2021   CHOLHDL 6.3 (H) 10/10/2021   Lab Results  Component Value Date   ALT 13 10/10/2021   AST 17 10/10/2021   PLT 259 10/10/2021   TSH 2.960 05/29/2020     He was last seen for this 4 months ago.  Management since that visit includes advise to cut back on sweets and starch foods which he has been compliant.   Wt Readings from Last 3 Encounters:  02/22/22 198 lb (89.8 kg)  10/09/21 205 lb (93 kg)  06/28/21 204 lb (92.5 kg)      The 10-year ASCVD risk score (Arnett DK, et al., 2019) is: 30.9%  --------------------------------------------------------------------------------------------------- Prediabetes, Follow-up  Lab Results  Component Value Date   HGBA1C 6.5 (H) 10/10/2021   HGBA1C 6.4 (H) 05/29/2020   HGBA1C 6.1 (H) 01/13/2019   GLUCOSE 116 (H) 10/10/2021   GLUCOSE 137 (H) 05/29/2020   GLUCOSE 103 (H) 01/13/2019    Last seen for for this4 months ago.  Management since that visit includes; labs checked showing A1c was elevated.   Wt Readings from Last 3 Encounters:  02/22/22 198 lb (89.8 kg)  10/09/21 205 lb (93 kg)  06/28/21 204 lb (92.5 kg)    -----------------------------------------------------------------------------------------   Medications: Outpatient  Medications Prior to Visit  Medication Sig   acetaminophen (TYLENOL) 325 MG tablet Take 650 mg by mouth every 6 (six) hours as needed for moderate pain or headache.   imipramine (TOFRANIL) 50 MG tablet Take 50 mg by mouth at bedtime.   tamsulosin (FLOMAX) 0.4 MG CAPS capsule Take 0.4 mg by mouth daily after supper.    [DISCONTINUED] naproxen sodium (ALEVE) 220 MG tablet Take 440 mg by mouth daily. (Patient not taking: Reported on 02/22/2022)   [DISCONTINUED] pentosan polysulfate (ELMIRON) 100 MG capsule Take 100 mg by mouth 3 (three) times daily.  (Patient not taking: Reported on 06/28/2021)   No facility-administered medications prior to visit.    Review of Systems  Constitutional:  Negative for appetite change, chills and fever.  Respiratory:  Negative for chest tightness, shortness of breath and wheezing.   Cardiovascular:  Negative for chest pain and palpitations.  Gastrointestinal:  Negative for abdominal pain, nausea and vomiting.       Objective    BP 131/76 (BP Location: Right Arm, Patient Position: Sitting, Cuff Size: Large)   Pulse 76   Resp 16   Wt 198 lb (89.8 kg)   SpO2 99%   BMI 27.62 kg/m    Physical Exam  General appearance: Well developed, well nourished male, cooperative and in no acute distress Head: Normocephalic, without obvious abnormality, atraumatic Respiratory: Respirations even and unlabored, normal respiratory rate Extremities: All extremities are intact.  Skin: Skin color, texture, turgor normal.  No rashes seen  Psych: Appropriate mood and affect. Neurologic: Mental status: Alert, oriented to person, place, and time, thought content appropriate.   Assessment & Plan     1. Prediabetes  - Hemoglobin A1c  2. Hypertriglyceridemia Is doing much better with diet having eliminated sweets and stopped drinking soft drinks.   - Lipid panel  3. Chronic kidney disease, stage 3a ([place])  - Renal function panel     The entirety of the information  documented in the History of Present Illness, Review of Systems and Physical Exam were personally obtained by me. Portions of this information were initially documented by the CMA and reviewed by me for thoroughness and accuracy.     Lelon Huh, MD  [facility] [phone] (phone) [phone] (fax)  [place]

## 2022-02-22 ENCOUNTER — Ambulatory Visit (INDEPENDENT_AMBULATORY_CARE_PROVIDER_SITE_OTHER): Payer: PPO | Admitting: Family Medicine

## 2022-02-22 ENCOUNTER — Encounter: Payer: Self-pay | Admitting: Family Medicine

## 2022-02-22 VITALS — BP 131/76 | HR 76 | Resp 16 | Wt 198.0 lb

## 2022-02-22 DIAGNOSIS — E781 Pure hyperglyceridemia: Secondary | ICD-10-CM

## 2022-02-22 DIAGNOSIS — R7303 Prediabetes: Secondary | ICD-10-CM | POA: Diagnosis not present

## 2022-02-22 DIAGNOSIS — N1831 Chronic kidney disease, stage 3a: Secondary | ICD-10-CM | POA: Diagnosis not present

## 2022-02-23 LAB — LIPID PANEL
Chol/HDL Ratio: 5.3 ratio — ABNORMAL HIGH (ref 0.0–5.0)
Cholesterol, Total: 181 mg/dL (ref 100–199)
HDL: 34 mg/dL — ABNORMAL LOW
LDL Chol Calc (NIH): 108 mg/dL — ABNORMAL HIGH (ref 0–99)
Triglycerides: 223 mg/dL — ABNORMAL HIGH (ref 0–149)
VLDL Cholesterol Cal: 39 mg/dL (ref 5–40)

## 2022-02-23 LAB — RENAL FUNCTION PANEL
Albumin: 4.8 g/dL (ref 3.8–4.8)
BUN/Creatinine Ratio: 8 — ABNORMAL LOW (ref 10–24)
BUN: 14 mg/dL (ref 8–27)
CO2: 23 mmol/L (ref 20–29)
Calcium: 9.8 mg/dL (ref 8.6–10.2)
Chloride: 101 mmol/L (ref 96–106)
Creatinine, Ser: 1.65 mg/dL — ABNORMAL HIGH (ref 0.76–1.27)
Glucose: 110 mg/dL — ABNORMAL HIGH (ref 70–99)
Phosphorus: 3.5 mg/dL (ref 2.8–4.1)
Potassium: 4.7 mmol/L (ref 3.5–5.2)
Sodium: 139 mmol/L (ref 134–144)
eGFR: 43 mL/min/{1.73_m2} — ABNORMAL LOW

## 2022-02-23 LAB — HEMOGLOBIN A1C
Est. average glucose Bld gHb Est-mCnc: 134 mg/dL
Hgb A1c MFr Bld: 6.3 % — ABNORMAL HIGH (ref 4.8–5.6)

## 2022-02-26 ENCOUNTER — Other Ambulatory Visit: Payer: Self-pay | Admitting: Family Medicine

## 2022-02-26 DIAGNOSIS — N1832 Chronic kidney disease, stage 3b: Secondary | ICD-10-CM

## 2022-03-11 DIAGNOSIS — M7541 Impingement syndrome of right shoulder: Secondary | ICD-10-CM | POA: Diagnosis not present

## 2022-04-17 DIAGNOSIS — N281 Cyst of kidney, acquired: Secondary | ICD-10-CM | POA: Insufficient documentation

## 2022-04-17 DIAGNOSIS — I10 Essential (primary) hypertension: Secondary | ICD-10-CM | POA: Diagnosis not present

## 2022-04-17 DIAGNOSIS — E1122 Type 2 diabetes mellitus with diabetic chronic kidney disease: Secondary | ICD-10-CM | POA: Insufficient documentation

## 2022-04-17 DIAGNOSIS — R8290 Unspecified abnormal findings in urine: Secondary | ICD-10-CM | POA: Diagnosis not present

## 2022-04-17 DIAGNOSIS — N1832 Chronic kidney disease, stage 3b: Secondary | ICD-10-CM | POA: Diagnosis not present

## 2022-04-17 DIAGNOSIS — E781 Pure hyperglyceridemia: Secondary | ICD-10-CM | POA: Diagnosis not present

## 2022-04-17 DIAGNOSIS — R7303 Prediabetes: Secondary | ICD-10-CM | POA: Diagnosis not present

## 2022-04-17 DIAGNOSIS — N400 Enlarged prostate without lower urinary tract symptoms: Secondary | ICD-10-CM | POA: Diagnosis not present

## 2022-04-18 ENCOUNTER — Other Ambulatory Visit: Payer: Self-pay | Admitting: Nephrology

## 2022-04-18 DIAGNOSIS — R8290 Unspecified abnormal findings in urine: Secondary | ICD-10-CM

## 2022-04-18 DIAGNOSIS — N1832 Chronic kidney disease, stage 3b: Secondary | ICD-10-CM

## 2022-04-30 ENCOUNTER — Ambulatory Visit
Admission: RE | Admit: 2022-04-30 | Discharge: 2022-04-30 | Disposition: A | Discharge status: Home / Self Care | Payer: HMO | Source: Ambulatory Visit | Attending: Nephrology | Admitting: Nephrology

## 2022-04-30 DIAGNOSIS — N1832 Chronic kidney disease, stage 3b: Secondary | ICD-10-CM | POA: Diagnosis not present

## 2022-04-30 DIAGNOSIS — N189 Chronic kidney disease, unspecified: Secondary | ICD-10-CM | POA: Diagnosis not present

## 2022-04-30 DIAGNOSIS — R8290 Unspecified abnormal findings in urine: Secondary | ICD-10-CM | POA: Insufficient documentation

## 2022-04-30 DIAGNOSIS — N281 Cyst of kidney, acquired: Secondary | ICD-10-CM | POA: Diagnosis not present

## 2022-06-10 DIAGNOSIS — I10 Essential (primary) hypertension: Secondary | ICD-10-CM | POA: Diagnosis not present

## 2022-06-10 DIAGNOSIS — N1832 Chronic kidney disease, stage 3b: Secondary | ICD-10-CM | POA: Diagnosis not present

## 2022-06-10 DIAGNOSIS — N281 Cyst of kidney, acquired: Secondary | ICD-10-CM | POA: Diagnosis not present

## 2022-06-10 DIAGNOSIS — R7303 Prediabetes: Secondary | ICD-10-CM | POA: Diagnosis not present

## 2022-06-10 DIAGNOSIS — E781 Pure hyperglyceridemia: Secondary | ICD-10-CM | POA: Diagnosis not present

## 2022-06-10 DIAGNOSIS — N400 Enlarged prostate without lower urinary tract symptoms: Secondary | ICD-10-CM | POA: Diagnosis not present

## 2022-06-10 DIAGNOSIS — R8290 Unspecified abnormal findings in urine: Secondary | ICD-10-CM | POA: Diagnosis not present

## 2022-06-18 DIAGNOSIS — N400 Enlarged prostate without lower urinary tract symptoms: Secondary | ICD-10-CM | POA: Diagnosis not present

## 2022-06-18 DIAGNOSIS — I10 Essential (primary) hypertension: Secondary | ICD-10-CM | POA: Diagnosis not present

## 2022-06-18 DIAGNOSIS — E781 Pure hyperglyceridemia: Secondary | ICD-10-CM | POA: Diagnosis not present

## 2022-06-18 DIAGNOSIS — N1832 Chronic kidney disease, stage 3b: Secondary | ICD-10-CM | POA: Diagnosis not present

## 2022-06-18 DIAGNOSIS — R7303 Prediabetes: Secondary | ICD-10-CM | POA: Diagnosis not present

## 2022-07-03 ENCOUNTER — Ambulatory Visit (INDEPENDENT_AMBULATORY_CARE_PROVIDER_SITE_OTHER): Payer: HMO

## 2022-07-03 VITALS — BP 118/74 | Ht 70.0 in | Wt 188.6 lb

## 2022-07-03 DIAGNOSIS — Z0000 Encounter for general adult medical examination without abnormal findings: Secondary | ICD-10-CM | POA: Diagnosis not present

## 2022-07-03 NOTE — Patient Instructions (Signed)
Rodney Hall , Thank you for taking time to come for your Medicare Wellness Visit. I appreciate your ongoing commitment to your health goals. Please review the following plan we discussed and let me know if I can assist you in the future.   These are the goals we discussed:  Goals      care coordination activities     Care Coordination Interventions:   SDOH screening completed Confirmed AWV scheduled for 07/23/22 Care Management Program introduced     DIET - EAT MORE FRUITS AND VEGETABLES        This is a list of the screening recommended for you and due dates:  Health Maintenance  Topic Date Due   COVID-19 Vaccine (1) Never done   Flu Shot  10/03/2022   Medicare Annual Wellness Visit  07/03/2023   DTaP/Tdap/Td vaccine (2 - Td or Tdap) 04/05/2025   Pneumonia Vaccine  Completed   Hepatitis C Screening: USPSTF Recommendation to screen - Ages 81-79 yo.  Completed   Zoster (Shingles) Vaccine  Completed   HPV Vaccine  Aged Out   Colon Cancer Screening  Discontinued    Advanced directives: no  Conditions/risks identified: none  Next appointment: Follow up in one year for your annual wellness visit. 07/08/2023 @10 :15am in person  Preventive Care 65 Years and Older, Male  Preventive care refers to lifestyle choices and visits with your health care provider that can promote health and wellness. What does preventive care include? A yearly physical exam. This is also called an annual well check. Dental exams once or twice a year. Routine eye exams. Ask your health care provider how often you should have your eyes checked. Personal lifestyle choices, including: Daily care of your teeth and gums. Regular physical activity. Eating a healthy diet. Avoiding tobacco and drug use. Limiting alcohol use. Practicing safe sex. Taking low doses of aspirin every day. Taking vitamin and mineral supplements as recommended by your health care provider. What happens during an annual well  check? The services and screenings done by your health care provider during your annual well check will depend on your age, overall health, lifestyle risk factors, and family history of disease. Counseling  Your health care provider may ask you questions about your: Alcohol use. Tobacco use. Drug use. Emotional well-being. Home and relationship well-being. Sexual activity. Eating habits. History of falls. Memory and ability to understand (cognition). Work and work Astronomer. Screening  You may have the following tests or measurements: Height, weight, and BMI. Blood pressure. Lipid and cholesterol levels. These may be checked every 5 years, or more frequently if you are over 7 years old. Skin check. Lung cancer screening. You may have this screening every year starting at age 83 if you have a 30-pack-year history of smoking and currently smoke or have quit within the past 15 years. Fecal occult blood test (FOBT) of the stool. You may have this test every year starting at age 32. Flexible sigmoidoscopy or colonoscopy. You may have a sigmoidoscopy every 5 years or a colonoscopy every 10 years starting at age 45. Prostate cancer screening. Recommendations will vary depending on your family history and other risks. Hepatitis C blood test. Hepatitis B blood test. Sexually transmitted disease (STD) testing. Diabetes screening. This is done by checking your blood sugar (glucose) after you have not eaten for a while (fasting). You may have this done every 1-3 years. Abdominal aortic aneurysm (AAA) screening. You may need this if you are a current or former smoker.  Osteoporosis. You may be screened starting at age 69 if you are at high risk. Talk with your health care provider about your test results, treatment options, and if necessary, the need for more tests. Vaccines  Your health care provider may recommend certain vaccines, such as: Influenza vaccine. This is recommended every  year. Tetanus, diphtheria, and acellular pertussis (Tdap, Td) vaccine. You may need a Td booster every 10 years. Zoster vaccine. You may need this after age 84. Pneumococcal 13-valent conjugate (PCV13) vaccine. One dose is recommended after age 57. Pneumococcal polysaccharide (PPSV23) vaccine. One dose is recommended after age 43. Talk to your health care provider about which screenings and vaccines you need and how often you need them. This information is not intended to replace advice given to you by your health care provider. Make sure you discuss any questions you have with your health care provider. Document Released: 03/17/2015 Document Revised: 11/08/2015 Document Reviewed: 12/20/2014 Elsevier Interactive Patient Education  2017 [employer].  Fall Prevention in the Home Falls can cause injuries. They can happen to people of all ages. There are many things you can do to make your home safe and to help prevent falls. What can I do on the outside of my home? Regularly fix the edges of walkways and driveways and fix any cracks. Remove anything that might make you trip as you walk through a door, such as a raised step or threshold. Trim any bushes or trees on the path to your home. Use bright outdoor lighting. Clear any walking paths of anything that might make someone trip, such as rocks or tools. Regularly check to see if handrails are loose or broken. Make sure that both sides of any steps have handrails. Any raised decks and porches should have guardrails on the edges. Have any leaves, snow, or ice cleared regularly. Use sand or salt on walking paths during winter. Clean up any spills in your garage right away. This includes oil or grease spills. What can I do in the bathroom? Use night lights. Install grab bars by the toilet and in the tub and shower. Do not use towel bars as grab bars. Use non-skid mats or decals in the tub or shower. If you need to sit down in the shower, use a  plastic, non-slip stool. Keep the floor dry. Clean up any water that spills on the floor as soon as it happens. Remove soap buildup in the tub or shower regularly. Attach bath mats securely with double-sided non-slip rug tape. Do not have throw rugs and other things on the floor that can make you trip. What can I do in the bedroom? Use night lights. Make sure that you have a light by your bed that is easy to reach. Do not use any sheets or blankets that are too big for your bed. They should not hang down onto the floor. Have a firm chair that has side arms. You can use this for support while you get dressed. Do not have throw rugs and other things on the floor that can make you trip. What can I do in the kitchen? Clean up any spills right away. Avoid walking on wet floors. Keep items that you use a lot in easy-to-reach places. If you need to reach something above you, use a strong step stool that has a grab bar. Keep electrical cords out of the way. Do not use floor polish or wax that makes floors slippery. If you must use wax, use non-skid floor  wax. Do not have throw rugs and other things on the floor that can make you trip. What can I do with my stairs? Do not leave any items on the stairs. Make sure that there are handrails on both sides of the stairs and use them. Fix handrails that are broken or loose. Make sure that handrails are as long as the stairways. Check any carpeting to make sure that it is firmly attached to the stairs. Fix any carpet that is loose or worn. Avoid having throw rugs at the top or bottom of the stairs. If you do have throw rugs, attach them to the floor with carpet tape. Make sure that you have a light switch at the top of the stairs and the bottom of the stairs. If you do not have them, ask someone to add them for you. What else can I do to help prevent falls? Wear shoes that: Do not have high heels. Have rubber bottoms. Are comfortable and fit you  well. Are closed at the toe. Do not wear sandals. If you use a stepladder: Make sure that it is fully opened. Do not climb a closed stepladder. Make sure that both sides of the stepladder are locked into place. Ask someone to hold it for you, if possible. Clearly mark and make sure that you can see: Any grab bars or handrails. First and last steps. Where the edge of each step is. Use tools that help you move around (mobility aids) if they are needed. These include: Canes. Walkers. Scooters. Crutches. Turn on the lights when you go into a dark area. Replace any light bulbs as soon as they burn out. Set up your furniture so you have a clear path. Avoid moving your furniture around. If any of your floors are uneven, fix them. If there are any pets around you, be aware of where they are. Review your medicines with your doctor. Some medicines can make you feel dizzy. This can increase your chance of falling. Ask your doctor what other things that you can do to help prevent falls. This information is not intended to replace advice given to you by your health care provider. Make sure you discuss any questions you have with your health care provider. Document Released: 12/15/2008 Document Revised: 07/27/2015 Document Reviewed: 03/25/2014 Elsevier Interactive Patient Education  2017 [employer].

## 2022-07-03 NOTE — Progress Notes (Signed)
Subjective:   Rodney Hall. is a 77 y.o. male who presents for Medicare Annual/Subsequent preventive examination.  Review of Systems    Cardiac Risk Factors include: advanced age (>2men, >51 women);dyslipidemia;male gender    Objective:    Today's Vitals   07/03/22 1113  BP: 118/74  Weight: 188 lb 9.6 oz (85.5 kg)  Height: 5\' 10"  (1.778 m)   Body mass index is 27.06 kg/m.     07/03/2022   11:22 AM 06/28/2021   10:13 AM 12/12/2017   10:28 AM 07/22/2017   11:24 AM 07/10/2017    9:22 AM 07/08/2017   10:10 AM 06/02/2017    5:11 AM  Advanced Directives  Does Patient Have a Medical Advance Directive? No No No No No No No  Would patient like information on creating a medical advance directive?  No - Patient declined  No - Patient declined No - Patient declined No - Patient declined No - Patient declined    Current Medications (verified) Outpatient Encounter Medications as of 07/03/2022  Medication Sig   acetaminophen (TYLENOL) 325 MG tablet Take 650 mg by mouth every 6 (six) hours as needed for moderate pain or headache.   imipramine (TOFRANIL) 50 MG tablet Take 50 mg by mouth at bedtime.   tamsulosin (FLOMAX) 0.4 MG CAPS capsule Take 0.4 mg by mouth daily after supper.    No facility-administered encounter medications on file as of 07/03/2022.    Allergies (verified) Pravastatin and Amoxicillin   History: Past Medical History:  Diagnosis Date   Abdominal mass    Abdominal pain 06/02/2017   Anxiety    Pt. denies   Arthritis    pt. 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   Past Surgical History:  Procedure Laterality  Date   ANTERIOR LAT LUMBAR FUSION Right 07/30/2016   Procedure: Right Lateral two-three Lateral transpsoas interbody fusion with lateral plating/Minimally invasive decompression at Lumbar two-three;  Surgeon: Ditty, Loura Halt, MD;  Location: [facility] OR;  Service: Neurosurgery;  Laterality: Right;   BACK SURGERY  2012   CATARACT EXTRACTION, BILATERAL     CHOLECYSTECTOMY N/A 07/25/2017   Procedure: LAPAROSCOPIC CHOLECYSTECTOMY WITH INTRAOPERATIVE CHOLANGIOGRAM;  Surgeon: Luretha Murphy, MD;  Location: [facility] ORS;  Service: General;  Laterality: N/A;   COLONOSCOPY WITH PROPOFOL N/A 12/12/2017   Procedure: COLONOSCOPY WITH PROPOFOL;  Surgeon: Scot Jun, MD;  Location: [facility] ENDOSCOPY;  Service: Endoscopy;  Laterality: N/A;   EUS N/A 07/10/2017   Procedure: UPPER ENDOSCOPIC ULTRASOUND (EUS) LINEAR;  Surgeon: Rachael Fee, MD;  Location: [facility] ENDOSCOPY;  Service: Endoscopy;  Laterality: N/A;   EYE SURGERY     HEMI-MICRODISCECTOMY LUMBAR LAMINECTOMY LEVEL 1  09/2018   [facility]   NECK SURGERY  2000   TENNIS ELBOW RELEASE/NIRSCHEL PROCEDURE     TONSILLECTOMY AND ADENOIDECTOMY     Family History  Problem Relation Age of Onset   Cancer Mother        brain   Diabetes Father    Rheumatic fever Sister    Heart disease Sister    Diabetes Brother  Social History   Socioeconomic History   Marital status: Married    Spouse name: Not on file   Number of children: Not on file   Years of education: Not on file   Highest education level: Not on file  Occupational History   Not on file  Tobacco Use   Smoking status: Never   Smokeless tobacco: Never  Vaping Use   Vaping Use: Never used  Substance and Sexual Activity   Alcohol use: No    Alcohol/week: 0.0 standard drinks of alcohol   Drug use: No   Sexual activity: Yes  Other Topics Concern   Not on file  Social History Narrative   Not on file   Social Determinants of Health   Financial Resource Strain: Low Risk   (07/03/2022)   Overall Financial Resource Strain (CARDIA)    Difficulty of Paying Living Expenses: Not hard at all  Food Insecurity: No Food Insecurity (07/03/2022)   Hunger Vital Sign    Worried About Running Out of Food in the Last Year: Never true    Ran Out of Food in the Last Year: Never true  Transportation Needs: No Transportation Needs (07/03/2022)   PRAPARE - Administrator, Civil Service (Medical): No    Lack of Transportation (Non-Medical): No  Physical Activity: Sufficiently Active (07/03/2022)   Exercise Vital Sign    Days of Exercise per Week: 5 days    Minutes of Exercise per Session: 60 min  Stress: No Stress Concern Present (07/03/2022)   [employer] of Occupational Health - Occupational Stress Questionnaire    Feeling of Stress : Not at all  Social Connections: Moderately Isolated (07/03/2022)   Social Connection and Isolation Panel [NHANES]    Frequency of Communication with Friends and Family: More than three times a week    Frequency of Social Gatherings with Friends and Family: Three times a week    Attends Religious Services: Never    Active Member of Clubs or Organizations: No    Attends Banker Meetings: Never    Marital Status: Married    Tobacco Counseling Counseling given: Not Answered  Clinical Intake:  Pre-visit preparation completed: Yes  Pain : No/denies pain   BMI - recorded: 27.06 Nutritional Status: BMI 25 -29 Overweight Nutritional Risks: None Diabetes: No  How often do you need to have someone help you when you read instructions, pamphlets, or other written materials from your doctor or pharmacy?: 1 - Never  Diabetic?no  Interpreter Needed?: No  Comments: lives with wife Information entered by :: B.Syerra Abdelrahman,LPN   Activities of Daily Living    07/03/2022   11:22 AM 10/09/2021    1:46 PM  In your present state of health, do you have any difficulty performing the following activities:  Hearing? 1 1  Vision? 0 0   Difficulty concentrating or making decisions? 0 0  Walking or climbing stairs? 0 0  Dressing or bathing? 0 0  Doing errands, shopping? 0 0  Preparing Food and eating ? N   Using the Toilet? N   In the past six months, have you accidently leaked urine? N   Do you have problems with loss of bowel control? N   Managing your Medications? N   Managing your Finances? N   Housekeeping or managing your Housekeeping? N     Patient Care Team: Malva Limes, MD as PCP - General (Family Medicine) Orson Ape, MD as Consulting Physician (Urology) Jene Every, MD  as Consulting Physician (Orthopedic Surgery) [facility], [facility] (Ophthalmology)  Indicate any recent Medical Services you may have received from other than [facility] providers in the past year (date may be approximate).     Assessment:   This is a routine wellness examination for [facility].  Hearing/Vision screen Hearing Screening - Comments:: Adequate hearing w/hearing aides Vision Screening - Comments:: Adequate vision;only readers [facility]  Dietary issues and exercise activities discussed: Current Exercise Habits: Structured exercise class, Type of exercise: strength training/weights;stretching;treadmill;walking, Time (Minutes): > 60, Frequency (Times/Week): 5, Weekly Exercise (Minutes/Week): 0, Intensity: Moderate, Exercise limited by: None identified   Goals Addressed             This Visit's Progress    DIET - EAT MORE FRUITS AND VEGETABLES   On track      Depression Screen    07/03/2022   11:17 AM 10/10/2021    2:05 PM 10/10/2021    2:04 PM 10/09/2021    1:46 PM 06/28/2021   10:12 AM 05/26/2020    2:04 PM 01/11/2019    2:02 PM  PHQ 2/9 Scores  PHQ - 2 Score 0 0 0 0 0 0 0  PHQ- 9 Score    0  0 0    Fall Risk    07/03/2022   11:15 AM 10/09/2021    1:44 PM 06/28/2021   10:14 AM 05/26/2020    2:04 PM 09/28/2019    1:18 PM  Fall Risk   Falls in the past year? 0 0 1 0 0  Comment     Emmi Telephone Survey:  data to providers prior to load  Number falls in past yr: 0 0 0 0   Injury with Fall? 0 0 0 0   Risk for fall due to : No Fall Risks No Fall Risks History of fall(s)    Follow up Falls prevention discussed;Education provided Falls evaluation completed Falls prevention discussed Falls evaluation completed     FALL RISK PREVENTION PERTAINING TO THE HOME:  Any stairs in or around the home? Yes  If so, are there any without handrails? Yes  Home free of loose throw rugs in walkways, pet beds, electrical cords, etc? Yes  Adequate lighting in your home to reduce risk of falls? Yes   ASSISTIVE DEVICES UTILIZED TO PREVENT FALLS:  Life alert? No  Use of a cane, walker or w/c? No  Grab bars in the bathroom? No  Shower chair or bench in shower? No  Elevated toilet seat or a handicapped toilet? Yes   TIMED UP AND GO:  Was the test performed? Yes .  Length of time to ambulate 10 feet: 8 sec.   Gait steady and fast without use of assistive device  Cognitive Function:        07/03/2022   11:25 AM 05/26/2020    2:04 PM  6CIT Screen  What Year? 0 points 0 points  What month? 0 points 0 points  What time? 0 points 0 points  Count back from 20 0 points 0 points  Months in reverse 0 points 0 points  Repeat phrase 0 points 4 points  Total Score 0 points 4 points    Immunizations Immunization History  Administered Date(s) Administered   Fluad Quad(high Dose 65+) 11/30/2018, 12/20/2019   Influenza, High Dose Seasonal PF 12/26/2016, 12/23/2017, 12/06/2020   Influenza-Unspecified 12/03/2014   Pneumococcal Conjugate-13 03/09/2014   Pneumococcal Polysaccharide-23 04/06/2015   Tdap 04/06/2015   Zoster Recombinat (Shingrix) 01/14/2019, 03/16/2019  Zoster, Live 03/05/2011    TDAP status: Up to date  Flu Vaccine status: Up to date  Pneumococcal vaccine status: Up to date  Covid-19 vaccine status: Completed vaccines  Qualifies for Shingles Vaccine? Yes   Zostavax completed Yes    Shingrix Completed?: Yes  Screening Tests Health Maintenance  Topic Date Due   COVID-19 Vaccine (1) Never done   INFLUENZA VACCINE  10/03/2022   Medicare Annual Wellness (AWV)  07/03/2023   DTaP/Tdap/Td (2 - Td or Tdap) 04/05/2025   Pneumonia Vaccine 53+ Years old  Completed   Hepatitis C Screening  Completed   Zoster Vaccines- Shingrix  Completed   HPV VACCINES  Aged Out   COLONOSCOPY (Pts 45-44yrs Insurance coverage will need to be confirmed)  Discontinued    Health Maintenance  Health Maintenance Due  Topic Date Due   COVID-19 Vaccine (1) Never done    Colorectal cancer screening: Type of screening: Colonoscopy. Completed yes. Repeat every 5-10 years  Lung Cancer Screening: (Low Dose CT Chest recommended if Age 70-80 years, 30 pack-year currently smoking OR have quit w/in 15years.) does not qualify.   Lung Cancer Screening Referral: no  Additional Screening:  Hepatitis C Screening: does not qualify; Completed yes  Vision Screening: Recommended annual ophthalmology exams for early detection of glaucoma and other disorders of the eye. Is the patient up to date with their annual eye exam?  Yes  Who is the provider or what is the name of the office in which the patient attends annual eye exams? [facility] If pt is not established with a provider, would they like to be referred to a provider to establish care? No .   Dental Screening: Recommended annual dental exams for proper oral hygiene  Community Resource Referral / Chronic Care Management: CRR required this visit?  No   CCM required this visit?  No      Plan:     I have personally reviewed and noted the following in the patient's chart:   Medical and social history Use of alcohol, tobacco or illicit drugs  Current medications and supplements including opioid prescriptions. Patient is not currently taking opioid prescriptions. Functional ability and status Nutritional status Physical activity Advanced  directives List of other physicians Hospitalizations, surgeries, and ER visits in previous 12 months Vitals Screenings to include cognitive, depression, and falls Referrals and appointments  In addition, I have reviewed and discussed with patient certain preventive protocols, quality metrics, and best practice recommendations. A written personalized care plan for preventive services as well as general preventive health recommendations were provided to patient.     Sue Lush, LPN   4/0/9811   Nurse Notes: The patient states that he is doing well and has no concerns or questions at this time. He does note that his kidney function has improved with diet changes and is happy about that.

## 2022-08-01 ENCOUNTER — Ambulatory Visit (INDEPENDENT_AMBULATORY_CARE_PROVIDER_SITE_OTHER): Payer: HMO | Admitting: Dermatology

## 2022-08-01 VITALS — BP 139/63

## 2022-08-01 DIAGNOSIS — D1801 Hemangioma of skin and subcutaneous tissue: Secondary | ICD-10-CM

## 2022-08-01 DIAGNOSIS — L570 Actinic keratosis: Secondary | ICD-10-CM | POA: Diagnosis not present

## 2022-08-01 DIAGNOSIS — Z872 Personal history of diseases of the skin and subcutaneous tissue: Secondary | ICD-10-CM | POA: Diagnosis not present

## 2022-08-01 DIAGNOSIS — Z85828 Personal history of other malignant neoplasm of skin: Secondary | ICD-10-CM

## 2022-08-01 DIAGNOSIS — L821 Other seborrheic keratosis: Secondary | ICD-10-CM

## 2022-08-01 DIAGNOSIS — L820 Inflamed seborrheic keratosis: Secondary | ICD-10-CM

## 2022-08-01 DIAGNOSIS — W908XXA Exposure to other nonionizing radiation, initial encounter: Secondary | ICD-10-CM

## 2022-08-01 DIAGNOSIS — Z1283 Encounter for screening for malignant neoplasm of skin: Secondary | ICD-10-CM

## 2022-08-01 DIAGNOSIS — L814 Other melanin hyperpigmentation: Secondary | ICD-10-CM | POA: Diagnosis not present

## 2022-08-01 DIAGNOSIS — X32XXXA Exposure to sunlight, initial encounter: Secondary | ICD-10-CM

## 2022-08-01 DIAGNOSIS — D229 Melanocytic nevi, unspecified: Secondary | ICD-10-CM

## 2022-08-01 DIAGNOSIS — L578 Other skin changes due to chronic exposure to nonionizing radiation: Secondary | ICD-10-CM

## 2022-08-01 DIAGNOSIS — Z8589 Personal history of malignant neoplasm of other organs and systems: Secondary | ICD-10-CM

## 2022-08-01 NOTE — Patient Instructions (Addendum)
Cryotherapy Aftercare  Wash gently with soap and water everyday.   Apply Vaseline and Band-Aid daily until healed.     Due to recent changes in healthcare laws, you may see results of your pathology and/or laboratory studies on MyChart before the doctors have had a chance to review them. We understand that in some cases there may be results that are confusing or concerning to you. Please understand that not all results are received at the same time and often the doctors may need to interpret multiple results in order to provide you with the best plan of care or course of treatment. Therefore, we ask that you please give us 2 business days to thoroughly review all your results before contacting the office for clarification. Should we see a critical lab result, you will be contacted sooner.   If You Need Anything After Your Visit  If you have any questions or concerns for your doctor, please call our main line at [phone] and press option 4 to reach your doctor's medical assistant. If no one answers, please leave a voicemail as directed and we will return your call as soon as possible. Messages left after 4 pm will be answered the following business day.   You may also send us a message via MyChart. We typically respond to MyChart messages within 1-2 business days.  For prescription refills, please ask your pharmacy to contact our office. Our fax number is [phone].  If you have an urgent issue when the clinic is closed that cannot wait until the next business day, you can page your doctor at the number below.    Please note that while we do our best to be available for urgent issues outside of office hours, we are not available 24/7.   If you have an urgent issue and are unable to reach us, you may choose to seek medical care at your doctor's office, retail clinic, urgent care center, or emergency room.  If you have a medical emergency, please immediately call 911 or go to the  emergency department.  Pager Numbers  - Dr. Kowalski: [phone]  - Dr. Moye: [phone]  - Dr. Stewart: [phone]  In the event of inclement weather, please call our main line at [phone] for an update on the status of any delays or closures.  Dermatology Medication Tips: Please keep the boxes that topical medications come in in order to help keep track of the instructions about where and how to use these. Pharmacies typically print the medication instructions only on the boxes and not directly on the medication tubes.   If your medication is too expensive, please contact our office at [phone] option 4 or send us a message through MyChart.   We are unable to tell what your co-pay for medications will be in advance as this is different depending on your insurance coverage. However, we may be able to find a substitute medication at lower cost or fill out paperwork to get insurance to cover a needed medication.   If a prior authorization is required to get your medication covered by your insurance company, please allow us 1-2 business days to complete this process.  Drug prices often vary depending on where the prescription is filled and some pharmacies may offer cheaper prices.  The website www.goodrx.com contains coupons for medications through different pharmacies. The prices here do not account for what the cost may be with help from insurance (it may be cheaper with your insurance), but the website can   give you the price if you did not use any insurance.  - You can print the associated coupon and take it with your prescription to the pharmacy.  - You may also stop by our office during regular business hours and pick up a GoodRx coupon card.  - If you need your prescription sent electronically to a different pharmacy, notify our office through [facility] MyChart or by phone at [phone] option 4.     Si Usted Necesita Algo Despus de Su Visita  Tambin puede  enviarnos un mensaje a travs de MyChart. Por lo general respondemos a los mensajes de MyChart en el transcurso de 1 a 2 das hbiles.  Para renovar recetas, por favor pida a su farmacia que se ponga en contacto con nuestra oficina. Nuestro nmero de fax es el [phone].  Si tiene un asunto urgente cuando la clnica est cerrada y que no puede esperar hasta el siguiente da hbil, puede llamar/localizar a su doctor(a) al nmero que aparece a continuacin.   Por favor, tenga en cuenta que aunque hacemos todo lo posible para estar disponibles para asuntos urgentes fuera del horario de oficina, no estamos disponibles las 24 horas del da, los 7 das de la semana.   Si tiene un problema urgente y no puede comunicarse con nosotros, puede optar por buscar atencin mdica  en el consultorio de su doctor(a), en una clnica privada, en un centro de atencin urgente o en una sala de emergencias.  Si tiene una emergencia mdica, por favor llame inmediatamente al 911 o vaya a la sala de emergencias.  Nmeros de bper  - Dr. Kowalski: [phone]  - Dra. Moye: [phone]  - Dra. Stewart: [phone]  En caso de inclemencias del tiempo, por favor llame a nuestra lnea principal al [phone] para una actualizacin sobre el estado de cualquier retraso o cierre.  Consejos para la medicacin en dermatologa: Por favor, guarde las cajas en las que vienen los medicamentos de uso tpico para ayudarle a seguir las instrucciones sobre dnde y cmo usarlos. Las farmacias generalmente imprimen las instrucciones del medicamento slo en las cajas y no directamente en los tubos del medicamento.   Si su medicamento es muy caro, por favor, pngase en contacto con nuestra oficina llamando al [phone] y presione la opcin 4 o envenos un mensaje a travs de MyChart.   No podemos decirle cul ser su copago por los medicamentos por adelantado ya que esto es diferente dependiendo de la cobertura de su seguro.  Sin embargo, es posible que podamos encontrar un medicamento sustituto a menor costo o llenar un formulario para que el seguro cubra el medicamento que se considera necesario.   Si se requiere una autorizacin previa para que su compaa de seguros cubra su medicamento, por favor permtanos de 1 a 2 das hbiles para completar este proceso.  Los precios de los medicamentos varan con frecuencia dependiendo del lugar de dnde se surte la receta y alguna farmacias pueden ofrecer precios ms baratos.  El sitio web www.goodrx.com tiene cupones para medicamentos de diferentes farmacias. Los precios aqu no tienen en cuenta lo que podra costar con la ayuda del seguro (puede ser ms barato con su seguro), pero el sitio web puede darle el precio si no utiliz ningn seguro.  - Puede imprimir el cupn correspondiente y llevarlo con su receta a la farmacia.  - Tambin puede pasar por nuestra oficina durante el horario de atencin regular y recoger una tarjeta de cupones de GoodRx.  -   Si necesita que su receta se enve electrnicamente a una farmacia diferente, informe a nuestra oficina a travs de MyChart de [facility] o por telfono llamando al [phone] y presione la opcin 4.  

## 2022-08-01 NOTE — Progress Notes (Signed)
Follow-Up Visit   Subjective  Rodney Tiet. is a 77 y.o. male who presents for the following: Skin Cancer Screening and Upper Body Skin Exam, hx of BCC, SCC IS, AKs The patient presents for Upper Body Skin Exam (UBSE) for skin cancer screening and mole check. The patient has spots, moles and lesions to be evaluated, some may be new or changing and the patient has concerns that these could be cancer.  The following portions of the chart were reviewed this encounter and updated as appropriate: medications, allergies, medical history  Review of Systems:  No other skin or systemic complaints except as noted in HPI or Assessment and Plan.  Objective  Well appearing patient in no apparent distress; mood and affect are within normal limits. All skin waist up examined. Relevant physical exam findings are noted in the Assessment and Plan.  face, ears x 8 (8) Pink scaly macules  L infraorbital x 1 Stuck on waxy paps with erythema   Assessment & Plan   AK (actinic keratosis) (8) face, ears x 8  Destruction of lesion - face, ears x 8 Complexity: simple   Destruction method: cryotherapy   Informed consent: discussed and consent obtained   Timeout:  patient name, date of birth, surgical site, and procedure verified Lesion destroyed using liquid nitrogen: Yes   Region frozen until ice ball extended beyond lesion: Yes   Outcome: patient tolerated procedure well with no complications   Post-procedure details: wound care instructions given    Inflamed seborrheic keratosis L infraorbital x 1  Symptomatic, irritating, patient would like treated.   Destruction of lesion - L infraorbital x 1 Complexity: simple   Destruction method: cryotherapy   Informed consent: discussed and consent obtained   Timeout:  patient name, date of birth, surgical site, and procedure verified Lesion destroyed using liquid nitrogen: Yes   Region frozen until ice ball extended beyond lesion: Yes    Outcome: patient tolerated procedure well with no complications   Post-procedure details: wound care instructions given    Lentigines, Seborrheic Keratoses, Hemangiomas - Benign normal skin lesions - Benign-appearing - Call for any changes  Melanocytic Nevi - Tan-brown and/or pink-flesh-colored symmetric macules and papules - Benign appearing on exam today - Observation - Call clinic for new or changing moles - Recommend daily use of broad spectrum spf 30+ sunscreen to sun-exposed areas.   Actinic Damage - Chronic condition, secondary to cumulative UV/sun exposure - diffuse scaly erythematous macules with underlying dyspigmentation - Recommend daily broad spectrum sunscreen SPF 30+ to sun-exposed areas, reapply every 2 hours as needed.  - Staying in the shade or wearing long sleeves, sun glasses (UVA+UVB protection) and wide brim hats (4-inch brim around the entire circumference of the hat) are also recommended for sun protection.  - Call for new or changing lesions.  Skin cancer screening performed today.  HISTORY OF BASAL CELL CARCINOMA OF THE SKIN - No evidence of recurrence today - Recommend regular full body skin exams - Recommend daily broad spectrum sunscreen SPF 30+ to sun-exposed areas, reapply every 2 hours as needed.  - Call if any new or changing lesions are noted between office visits  - L lat forehead  HISTORY OF SQUAMOUS CELL CARCINOMA IN SITU OF THE SKIN - No evidence of recurrence today - Recommend regular full body skin exams - Recommend daily broad spectrum sunscreen SPF 30+ to sun-exposed areas, reapply every 2 hours as needed.  - Call if any new or changing lesions  are noted between office visits  - L above alar crease, sidewall of nose  Return in about 6 months (around 02/01/2023) for TBSE, Hx of BCC, Hx of SCC IS, Hx of AKs.  I, Ardis Rowan, RMA, am acting as scribe for Armida Sans, MD .  Documentation: I have reviewed the above documentation for  accuracy and completeness, and I agree with the above.  Armida Sans, MD

## 2022-08-13 ENCOUNTER — Encounter: Payer: Self-pay | Admitting: Dermatology

## 2022-09-03 ENCOUNTER — Other Ambulatory Visit: Payer: Self-pay

## 2022-09-03 ENCOUNTER — Inpatient Hospital Stay
Admission: EM | Admit: 2022-09-03 | Discharge: 2022-09-04 | DRG: 281 | Disposition: A | Discharge status: Other Acute Inpatient Hospital | Payer: HMO | Attending: Internal Medicine | Admitting: Internal Medicine

## 2022-09-03 ENCOUNTER — Emergency Department: Payer: HMO

## 2022-09-03 DIAGNOSIS — I214 Non-ST elevation (NSTEMI) myocardial infarction: Principal | ICD-10-CM | POA: Diagnosis present

## 2022-09-03 DIAGNOSIS — I129 Hypertensive chronic kidney disease with stage 1 through stage 4 chronic kidney disease, or unspecified chronic kidney disease: Secondary | ICD-10-CM | POA: Diagnosis present

## 2022-09-03 DIAGNOSIS — Z79899 Other long term (current) drug therapy: Secondary | ICD-10-CM | POA: Diagnosis not present

## 2022-09-03 DIAGNOSIS — N401 Enlarged prostate with lower urinary tract symptoms: Secondary | ICD-10-CM | POA: Diagnosis present

## 2022-09-03 DIAGNOSIS — Z888 Allergy status to other drugs, medicaments and biological substances status: Secondary | ICD-10-CM | POA: Diagnosis not present

## 2022-09-03 DIAGNOSIS — Z833 Family history of diabetes mellitus: Secondary | ICD-10-CM

## 2022-09-03 DIAGNOSIS — Z1152 Encounter for screening for COVID-19: Secondary | ICD-10-CM

## 2022-09-03 DIAGNOSIS — I5020 Unspecified systolic (congestive) heart failure: Secondary | ICD-10-CM | POA: Diagnosis not present

## 2022-09-03 DIAGNOSIS — I161 Hypertensive emergency: Secondary | ICD-10-CM | POA: Diagnosis present

## 2022-09-03 DIAGNOSIS — N1832 Chronic kidney disease, stage 3b: Secondary | ICD-10-CM

## 2022-09-03 DIAGNOSIS — I25118 Atherosclerotic heart disease of native coronary artery with other forms of angina pectoris: Secondary | ICD-10-CM | POA: Diagnosis present

## 2022-09-03 DIAGNOSIS — I160 Hypertensive urgency: Secondary | ICD-10-CM

## 2022-09-03 DIAGNOSIS — E785 Hyperlipidemia, unspecified: Secondary | ICD-10-CM | POA: Diagnosis present

## 2022-09-03 DIAGNOSIS — Z85828 Personal history of other malignant neoplasm of skin: Secondary | ICD-10-CM

## 2022-09-03 DIAGNOSIS — N183 Chronic kidney disease, stage 3 unspecified: Secondary | ICD-10-CM

## 2022-09-03 DIAGNOSIS — I10 Essential (primary) hypertension: Secondary | ICD-10-CM

## 2022-09-03 DIAGNOSIS — I2510 Atherosclerotic heart disease of native coronary artery without angina pectoris: Secondary | ICD-10-CM | POA: Diagnosis present

## 2022-09-03 DIAGNOSIS — Z880 Allergy status to penicillin: Secondary | ICD-10-CM | POA: Diagnosis not present

## 2022-09-03 DIAGNOSIS — Z8249 Family history of ischemic heart disease and other diseases of the circulatory system: Secondary | ICD-10-CM | POA: Diagnosis not present

## 2022-09-03 DIAGNOSIS — N138 Other obstructive and reflux uropathy: Secondary | ICD-10-CM | POA: Diagnosis present

## 2022-09-03 DIAGNOSIS — I25110 Atherosclerotic heart disease of native coronary artery with unstable angina pectoris: Secondary | ICD-10-CM | POA: Diagnosis not present

## 2022-09-03 DIAGNOSIS — R0602 Shortness of breath: Secondary | ICD-10-CM | POA: Diagnosis not present

## 2022-09-03 DIAGNOSIS — N1831 Chronic kidney disease, stage 3a: Secondary | ICD-10-CM | POA: Diagnosis present

## 2022-09-03 DIAGNOSIS — Z981 Arthrodesis status: Secondary | ICD-10-CM

## 2022-09-03 DIAGNOSIS — R079 Chest pain, unspecified: Secondary | ICD-10-CM | POA: Diagnosis not present

## 2022-09-03 LAB — BASIC METABOLIC PANEL WITH GFR
Anion gap: 10 (ref 5–15)
BUN: 18 mg/dL (ref 8–23)
CO2: 24 mmol/L (ref 22–32)
Calcium: 9.8 mg/dL (ref 8.9–10.3)
Chloride: 101 mmol/L (ref 98–111)
Creatinine, Ser: 1.41 mg/dL — ABNORMAL HIGH (ref 0.61–1.24)
GFR, Estimated: 51 mL/min — ABNORMAL LOW
Glucose, Bld: 133 mg/dL — ABNORMAL HIGH (ref 70–99)
Potassium: 4 mmol/L (ref 3.5–5.1)
Sodium: 135 mmol/L (ref 135–145)

## 2022-09-03 LAB — CBC
HCT: 49 % (ref 39.0–52.0)
Hemoglobin: 16.8 g/dL (ref 13.0–17.0)
MCH: 31.8 pg (ref 26.0–34.0)
MCHC: 34.3 g/dL (ref 30.0–36.0)
MCV: 92.6 fL (ref 80.0–100.0)
Platelets: 243 10*3/uL (ref 150–400)
RBC: 5.29 MIL/uL (ref 4.22–5.81)
RDW: 11.9 % (ref 11.5–15.5)
WBC: 10.2 10*3/uL (ref 4.0–10.5)
nRBC: 0 % (ref 0.0–0.2)

## 2022-09-03 LAB — PROTIME-INR
INR: 1.1 (ref 0.8–1.2)
Prothrombin Time: 14.2 s (ref 11.4–15.2)

## 2022-09-03 LAB — APTT: aPTT: 34 s (ref 24–36)

## 2022-09-03 LAB — TROPONIN I (HIGH SENSITIVITY)
Troponin I (High Sensitivity): 11 ng/L
Troponin I (High Sensitivity): 599 ng/L

## 2022-09-03 MED ORDER — SODIUM CHLORIDE 0.9 % IV SOLN: INTRAVENOUS | Status: DC

## 2022-09-03 MED ORDER — TRAZODONE HCL 50 MG PO TABS: 25.0000 mg | ORAL_TABLET | Freq: Every evening | ORAL | Status: DC | PRN

## 2022-09-03 MED ORDER — ASPIRIN 81 MG PO TBEC: 81.0000 mg | DELAYED_RELEASE_TABLET | Freq: Every day | ORAL | Status: DC

## 2022-09-03 MED ORDER — NITROGLYCERIN 0.4 MG SL SUBL
0.4000 mg | SUBLINGUAL_TABLET | SUBLINGUAL | Status: DC | PRN
  Administered 2022-09-03 (×3): 0.4 mg via SUBLINGUAL
  Filled 2022-09-03 (×2): qty 1

## 2022-09-03 MED ORDER — ALPRAZOLAM 0.5 MG PO TABS: 0.2500 mg | ORAL_TABLET | Freq: Two times a day (BID) | ORAL | Status: DC | PRN

## 2022-09-03 MED ORDER — ASPIRIN 81 MG PO CHEW
324.0000 mg | CHEWABLE_TABLET | Freq: Once | ORAL | Status: AC
  Administered 2022-09-03: 324 mg via ORAL
  Filled 2022-09-03: qty 4

## 2022-09-03 MED ORDER — MAGNESIUM HYDROXIDE 400 MG/5ML PO SUSP: 30.0000 mL | Freq: Every day | ORAL | Status: DC | PRN

## 2022-09-03 MED ORDER — ACETAMINOPHEN 325 MG PO TABS
650.0000 mg | ORAL_TABLET | ORAL | Status: DC | PRN
  Administered 2022-09-04: 650 mg via ORAL
  Filled 2022-09-03: qty 2

## 2022-09-03 MED ORDER — ONDANSETRON HCL 4 MG/2ML IJ SOLN: 4.0000 mg | Freq: Four times a day (QID) | INTRAMUSCULAR | Status: DC | PRN

## 2022-09-03 MED ORDER — HEPARIN (PORCINE) 25000 UT/250ML-% IV SOLN
1200.0000 [IU]/h | INTRAVENOUS | Status: DC
  Administered 2022-09-03: 1200 [IU]/h via INTRAVENOUS
  Filled 2022-09-03: qty 250

## 2022-09-03 MED ORDER — HEPARIN BOLUS VIA INFUSION
4000.0000 [IU] | Freq: Once | INTRAVENOUS | Status: AC
  Administered 2022-09-03: 4000 [IU] via INTRAVENOUS
  Filled 2022-09-03: qty 4000

## 2022-09-03 MED ORDER — TAMSULOSIN HCL 0.4 MG PO CAPS
0.4000 mg | ORAL_CAPSULE | Freq: Every day | ORAL | Status: DC
  Administered 2022-09-03: 0.4 mg via ORAL
  Filled 2022-09-03: qty 1

## 2022-09-03 MED ORDER — METOPROLOL TARTRATE 25 MG PO TABS
25.0000 mg | ORAL_TABLET | Freq: Two times a day (BID) | ORAL | Status: DC
  Administered 2022-09-03: 25 mg via ORAL
  Filled 2022-09-03 (×2): qty 1

## 2022-09-03 MED ORDER — NITROGLYCERIN 2 % TD OINT
1.0000 [in_us] | TOPICAL_OINTMENT | Freq: Once | TRANSDERMAL | Status: AC
  Administered 2022-09-03: 1 [in_us] via TOPICAL

## 2022-09-03 MED ORDER — ATORVASTATIN CALCIUM 20 MG PO TABS
80.0000 mg | ORAL_TABLET | Freq: Every day | ORAL | Status: DC
  Filled 2022-09-03: qty 4

## 2022-09-03 MED ORDER — NITROGLYCERIN 0.4 MG SL SUBL: 0.4000 mg | SUBLINGUAL_TABLET | SUBLINGUAL | Status: DC | PRN

## 2022-09-03 NOTE — H&P (Signed)
[place]   PATIENT NAME: Rodney Hall    MR#:  [id number]  DATE OF BIRTH:  Feb 24, 1946  DATE OF ADMISSION:  09/03/2022  PRIMARY CARE PHYSICIAN: Malva Limes, MD   Patient is coming from: Home  REQUESTING/REFERRING PHYSICIAN:, Carollee Herter, MD  CHIEF COMPLAINT:   Chief Complaint  Patient presents with   Chest Pain   Shortness of Breath    HISTORY OF PRESENT ILLNESS:  Rodney Hall. is a 77 y.o. Caucasian male with medical history significant for basal cell carcinoma, stage IIIa chronic kidney disease and BPH, presented to the emergency room with acute onset of midsternal chest pain with associated dyspnea as well as diaphoresis without palpitations or nausea or vomiting or radiation across his chest wall.  He described it as deep pressure and graded 9/10 in severity at its worst.  It was down to 6/10 by the time he came to the ER.  His son drove him here.  He denied any cough or wheezing hemoptysis.  No leg pain or edema or recent travels or surgeries.  No bleeding diathesis.  No melena or bright red being per rectum.  No dysuria, oliguria or hematuria or flank pain.  ED Course: When he came to the ER, BP was 204/139 with otherwise normal vital signs.  Labs revealed initial high-sensitivity troponin I of 11 and 2 hours later was up to 599.  Creatinine was 1.41 better than previous levels with stage IIIa chronic kidney disease.  CBC was within normal.  Coag profile was normal. EKG as reviewed by me : EKG showed normal sinus rhythm with a rate of 87 with left anterior fascicular block and suspected Q waves in inferior leads.  It showed earlier presentation and V3 through V6 with slightly peaked T waves. Repeat EKG showed sinus rhythm with a rate of 81 with PACs without significant changes Imaging: 2 view chest x-ray showed no acute cardiopulmonary disease..  The patient was given 4 baby aspirin, 3 sublingual nitroglycerin and was started on IV heparin with a bolus then drip.   He had an inch Nitropaste placed.  He was pain-free.  Dr. Nelly Laurence was notified about the patient and is aware.  The patient will be admitted to a progressive unit bed for further evaluation and management. PAST MEDICAL HISTORY:   Past Medical History:  Diagnosis Date   Abdominal mass    Abdominal pain 06/02/2017   Anxiety    Pt. denies   Arthritis    pt. 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    PAST SURGICAL HISTORY:   Past Surgical History:  Procedure Laterality Date   ANTERIOR LAT LUMBAR FUSION Right 07/30/2016   Procedure: Right Lateral two-three Lateral transpsoas interbody fusion with lateral plating/Minimally invasive decompression at Lumbar two-three;  Surgeon: Ditty, Loura Halt, MD;  Location: [facility] OR;  Service: Neurosurgery;  Laterality: Right;   BACK SURGERY  2012   CATARACT EXTRACTION, BILATERAL     CHOLECYSTECTOMY N/A 07/25/2017   Procedure: LAPAROSCOPIC CHOLECYSTECTOMY WITH INTRAOPERATIVE CHOLANGIOGRAM;  Surgeon: Luretha Murphy, MD;  Location: [facility] ORS;  Service: General;  Laterality: N/A;   COLONOSCOPY WITH PROPOFOL N/A 12/12/2017   Procedure: COLONOSCOPY WITH PROPOFOL;  Surgeon: Scot Jun, MD;  Location: [facility] ENDOSCOPY;  Service: Endoscopy;  Laterality: N/A;   EUS N/A 07/10/2017   Procedure: UPPER ENDOSCOPIC ULTRASOUND (EUS) LINEAR;  Surgeon: Rachael Fee, MD;  Location: [facility] ENDOSCOPY;  Service: Endoscopy;  Laterality: N/A;   EYE SURGERY     HEMI-MICRODISCECTOMY LUMBAR LAMINECTOMY LEVEL 1  09/2018   [facility]   NECK SURGERY  2000   TENNIS ELBOW RELEASE/NIRSCHEL PROCEDURE     TONSILLECTOMY AND  ADENOIDECTOMY      SOCIAL HISTORY:   Social History   Tobacco Use   Smoking status: Never   Smokeless tobacco: Never  Substance Use Topics   Alcohol use: No    Alcohol/week: 0.0 standard drinks of alcohol    FAMILY HISTORY:   Family History  Problem Relation Age of Onset   Cancer Mother        brain   Diabetes Father    Rheumatic fever Sister    Heart disease Sister    Diabetes Brother     DRUG ALLERGIES:   Allergies  Allergen Reactions   Pravastatin     jittery   Amoxicillin Rash    ++ got ceftriaxone in the past++ Has patient had a PCN reaction causing immediate rash, facial/tongue/throat swelling, SOB or lightheadedness with hypotension: Yes Has patient had a PCN reaction causing severe rash involving mucus membranes or skin necrosis: No Has patient had a PCN reaction that required hospitalization No Has patient had a PCN reaction occurring within the last 10 years: No If all of the above answers are "NO", then may proceed with Cephalosporin use.      REVIEW OF SYSTEMS:   ROS As per history of present illness. All pertinent systems were reviewed above. Constitutional, HEENT, cardiovascular, respiratory, GI, GU, musculoskeletal, neuro, psychiatric, endocrine, integumentary and hematologic systems were reviewed and are otherwise negative/unremarkable except for positive findings mentioned above in the HPI.   MEDICATIONS AT HOME:   Prior to Admission medications   Medication Sig Start Date End Date Taking? Authorizing Provider  acetaminophen (TYLENOL) 325 MG tablet Take 650 mg by mouth every 6 (six) hours as needed for moderate pain or headache.   Yes [provider]  imipramine (TOFRANIL) 50 MG tablet Take 50 mg by mouth at bedtime.   Yes [provider]  tamsulosin (FLOMAX) 0.4 MG CAPS capsule Take 0.4 mg by mouth daily after supper.    Yes [provider]      VITAL SIGNS:  Blood pressure (!) 151/89, pulse 77, temperature 97.9  F (36.6 C), temperature source Oral, resp. rate 20, height 5\' 10"  (1.778 m), weight 85.5 kg, SpO2 98 %.  PHYSICAL EXAMINATION:  Physical Exam  GENERAL:  77 y.o.-year-old Caucasian male patient lying in the bed with no acute distress.  EYES: Pupils equal, round, reactive to light and accommodation. No scleral icterus. Extraocular muscles intact.  HEENT: Head atraumatic, normocephalic. Oropharynx and nasopharynx clear.  NECK:  Supple, no jugular venous distention. No thyroid enlargement, no tenderness.  LUNGS: Normal breath sounds bilaterally, no wheezing, rales,rhonchi or crepitation. No use of accessory muscles of respiration.  CARDIOVASCULAR: Regular rate and rhythm, S1, S2 normal. No murmurs, rubs, or gallops.  ABDOMEN: Soft, nondistended, nontender. Bowel sounds present. No organomegaly or mass.  EXTREMITIES: No pedal edema, cyanosis, or clubbing.  NEUROLOGIC: Cranial nerves II through XII are intact. Muscle strength 5/5  in all extremities. Sensation intact. Gait not checked.  PSYCHIATRIC: The patient is alert and oriented x 3.  Normal affect and good eye contact. SKIN: No obvious rash, lesion, or ulcer.   LABORATORY PANEL:   CBC Recent Labs  Lab 09/03/22 1852  WBC 10.2  HGB 16.8  HCT 49.0  PLT 243   ------------------------------------------------------------------------------------------------------------------  Chemistries  Recent Labs  Lab 09/03/22 1852  NA 135  K 4.0  CL 101  CO2 24  GLUCOSE 133*  BUN 18  CREATININE 1.41*  CALCIUM 9.8   ------------------------------------------------------------------------------------------------------------------  Cardiac Enzymes No results for input(s): "TROPONINI" in the last 168 hours. ------------------------------------------------------------------------------------------------------------------  RADIOLOGY:  DG Chest 2 View  Result Date: 09/03/2022 CLINICAL DATA:  Chest pain, shortness of breath EXAM: CHEST - 2  VIEW COMPARISON:  None Available. FINDINGS: Lungs are clear.  No pleural effusion or pneumothorax. The heart is normal in size. Visualized osseous structures are within normal limits. IMPRESSION: Normal chest radiographs. Electronically Signed   By: Charline Bills M.D.   On: 09/03/2022 19:16      IMPRESSION AND PLAN:  Assessment and Plan: * NSTEMI (non-ST elevated myocardial infarction) [facility]) - The patient will be admitted to a progressive unit bed. - Will follow serial troponins and EKGs. - The patient will be placed on aspirin as well as p.r.n. sublingual nitroglycerin and morphine sulfate for pain. - We will add p.o. Lopressor and high-dose statin. - We will check fasting lipids and lipoprotein-A. - We will obtain a cardiology consult in a.m. for further cardiac risk stratification. - I notified Dr. Nelly Laurence about the patient.   Hypertensive urgency - We will start p.o. Lopressor and place the patient on as needed labetalol. - He is currently on Nitropaste.  Stage III chronic kidney disease (HCC) - This is currently stage IIIa chronic kidney disease. - He will be gently hydrated with IV normal saline. - We will follow BMP and avoid nephrotoxins.  Benign prostatic hyperplasia with urinary obstruction - We will continue Flomax.    DVT prophylaxis: IV heparin Advanced Care Planning:  Code Status: full code. Family Communication:  The plan of care was discussed in details with the patient (and family). I answered all questions. The patient agreed to proceed with the above mentioned plan. Further management will depend upon hospital course. Disposition Plan: Back to previous home environment Consults called: Cardiology All the records are reviewed and case discussed with ED provider.  Status is: Inpatient   At the time of the admission, it appears that the appropriate admission status for this patient is inpatient.  This is judged to be reasonable and necessary in order to  provide the required intensity of service to ensure the patient's safety given the presenting symptoms, physical exam findings and initial radiographic and laboratory data in the context of comorbid conditions.  The patient requires inpatient status due to high intensity of service, high risk of further deterioration and high frequency of surveillance required.  I certify that at the time of admission, it is my clinical judgment that the patient will require inpatient hospital care extending more than 2 midnights.                            Dispo: The patient is from: Home              Anticipated d/c is to: Home              Patient  currently is not medically stable to d/c.              Difficult to place patient: No  Hannah Beat M.D on 09/03/2022 at 11:27 PM  [facility]   From 7 PM-7 AM, contact night-coverage [email or web address]  CC: Primary care physician; Malva Limes, MD

## 2022-09-03 NOTE — Assessment & Plan Note (Signed)
-   We will continue Flomax 

## 2022-09-03 NOTE — Assessment & Plan Note (Signed)
-   This is currently stage IIIa chronic kidney disease. - He will be gently hydrated with IV normal saline. - We will follow BMP and avoid nephrotoxins.

## 2022-09-03 NOTE — Consult Note (Signed)
ANTICOAGULATION CONSULT NOTE - Initial Consult  Pharmacy Consult for heparin infusion Indication: chest pain/ACS  Allergies  Allergen Reactions   Pravastatin     jittery   Amoxicillin Rash    ++ got ceftriaxone in the past++ Has patient had a PCN reaction causing immediate rash, facial/tongue/throat swelling, SOB or lightheadedness with hypotension: Yes Has patient had a PCN reaction causing severe rash involving mucus membranes or skin necrosis: No Has patient had a PCN reaction that required hospitalization No Has patient had a PCN reaction occurring within the last 10 years: No If all of the above answers are "NO", then may proceed with Cephalosporin use.      Patient Measurements: Height: 5\' 10"  (177.8 cm) Weight: 85.5 kg (188 lb 7.9 oz) IBW/kg (Calculated) : 73 Heparin Dosing Weight: 85.5 kg  Vital Signs: Temp: 97.9 F (36.6 C) (07/02 1851) Temp Source: Oral (07/02 1851) BP: 170/112 (07/02 2037) Pulse Rate: 77 (07/02 2037)  Labs: Recent Labs    09/03/22 1852 09/03/22 2045  HGB 16.8  --   HCT 49.0  --   PLT 243  --   CREATININE 1.41*  --   TROPONINIHS 11 599*    Estimated Creatinine Clearance: 45.3 mL/min (A) (by C-G formula based on SCr of 1.41 mg/dL (H)).   Medical History: Past Medical History:  Diagnosis Date   Abdominal mass    Abdominal pain 06/02/2017   Anxiety    Pt. denies   Arthritis    pt. 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 male with PMH of HTN, CKD presented to [facility] with chest pain and SOB.Troponin I trend 11 >  599. Pharmacy has been consulted to initiate and manage IV heparin therapy.   Baseline CBC wnl, aPTT/INR ordered   Goal of Therapy:  Heparin level 0.3-0.7 units/ml Monitor platelets by anticoagulation protocol: Yes   Plan:  Give 4000 units bolus x 1 Start heparin infusion at 1200 units/hr Check anti-Xa level in 8 hours and daily while on heparin Continue to monitor H&H and platelets  Sharen Hones, PharmD, BCPS Clinical Pharmacist   09/03/2022,9:27 PM

## 2022-09-03 NOTE — ED Notes (Signed)
Report given to Jessica RN

## 2022-09-03 NOTE — ED Notes (Signed)
PT brought to ed rm 7 at this time, this RN now assuming care.  

## 2022-09-03 NOTE — ED Provider Notes (Signed)
[facility] Provider Note    Event Date/Time   First MD Initiated Contact with Patient 09/03/22 2053     (approximate)   History   Chest Pain and Shortness of Breath   HPI  Rodney Hall. is a 77 y.o. male presents to the emergency department following an episode of chest pain.  States that he was working outside and develops significant chest pain that radiated across to his chest, associated with nausea and broke out into a sweat and had some mild shortness of breath.  States that his chest pain has improved since arriving to the emergency department and currently rates it as 5/10.  Denies any tearing chest pain.  No prior similar episodes.  No history of coronary artery disease.  Denies any tobacco use.  No history of hypertension.  No prior stress testing or cardiac catheterization.     Physical Exam   Triage Vital Signs: ED Triage Vitals  Enc Vitals Group     BP 09/03/22 1851 (!) 204/110     Pulse Rate 09/03/22 1851 86     Resp 09/03/22 1851 18     Temp 09/03/22 1851 97.9 F (36.6 C)     Temp Source 09/03/22 1851 Oral     SpO2 09/03/22 1851 100 %     Weight 09/03/22 1850 188 lb 7.9 oz (85.5 kg)     Height 09/03/22 1850 5\' 10"  (1.778 m)     Head Circumference --      Peak Flow --      Pain Score 09/03/22 1850 5     Pain Loc --      Pain Edu? --      Excl. in GC? --     Most recent vital signs: Vitals:   09/03/22 2130 09/03/22 2200  BP: (!) 145/94 (!) 151/89  Pulse: 88 77  Resp: 18 20  Temp:    SpO2: 98% 98%    Physical Exam Constitutional:      Appearance: He is well-developed.  HENT:     Head: Atraumatic.  Eyes:     Conjunctiva/sclera: Conjunctivae normal.  Cardiovascular:     Rate and Rhythm: Regular rhythm.  Pulmonary:     Effort: No respiratory distress.     Breath sounds: No wheezing.  Musculoskeletal:     Cervical back: Normal range of motion.     Right lower leg: No edema.     Left lower leg: No edema.   Skin:    General: Skin is warm.  Neurological:     Mental Status: He is alert. Mental status is at baseline.     IMPRESSION / MDM / ASSESSMENT AND PLAN / ED COURSE  I reviewed the triage vital signs and the nursing notes.  Differential diagnosis including ACS, pneumonia, hypertensive emergency  EKG  I, Corena Herter, the attending physician, personally viewed and interpreted this ECG.   Rate: Normal  Rhythm: Normal sinus  Axis: Normal  Intervals: Normal  ST&T Change: T wave changes.  No hyperacute T waves.  No significant ST changes  Repeat EKG was obtained with no significant change from initial EKG  No tachycardic or bradycardic dysrhythmias while on cardiac telemetry.  RADIOLOGY I independently reviewed imaging, my interpretation of imaging: Chest x-ray with no widened mediastinum and no signs of pneumonia  LABS (all labs ordered are listed, but only abnormal results are displayed) Labs interpreted as -    Labs Reviewed  BASIC METABOLIC PANEL -  Abnormal; Notable for the following components:      Result Value   Glucose, Bld 133 (*)    Creatinine, Ser 1.41 (*)    GFR, Estimated 51 (*)    All other components within normal limits  TROPONIN I (HIGH SENSITIVITY) - Abnormal; Notable for the following components:   Troponin I (High Sensitivity) 599 (*)    All other components within normal limits  CBC  PROTIME-INR  APTT  HEPARIN LEVEL (UNFRACTIONATED)  CBC  LIPOPROTEIN A (LPA)  BASIC METABOLIC PANEL  LIPID PANEL  TROPONIN I (HIGH SENSITIVITY)     MDM  Initial troponin was negative, repeat troponin significantly elevated at approximately 600.  Creatinine 1.41 which is likely his baseline given his history of CKD.  No other significant electrolyte abnormalities  Patient was given aspirin and 3 tablets of sublingual nitroglycerin.  Started on heparin bolus and infusion.  Repeat EKG was obtained with no significant change from the initial.  Clinical Course as  of 09/04/22 0142  Tue Sep 03, 2022  2133 Called and discussed with cardiology on-call Dr. Hyacinth Meeker, recommended heparin, aspirin and nitroglycerin, serial EKGs and if ongoing chest pain recommended calling cardiac catheterization lab for possible catheterization. [SM]    Clinical Course User Index [SM] Corena Herter, MD   On reevaluation patient is currently chest pain-free, Nitropaste was applied.  Discussed with cardiology who recommended admission to the hospitalist on heparin and then if chest pain recurred to repeat EKG I would possibly need catheterization at that time.  Patiently currently chest pain-free.  Consulted hospitalist for admission for NSTEMI.  PROCEDURES:  Critical Care performed: yes  .Critical Care  Performed by: Corena Herter, MD Authorized by: Corena Herter, MD   Critical care provider statement:    Critical care time (minutes):  45   Critical care time was exclusive of:  Separately billable procedures and treating other patients   Critical care was necessary to treat or prevent imminent or life-threatening deterioration of the following conditions:  Cardiac failure   Critical care was time spent personally by me on the following activities:  Development of treatment plan with patient or surrogate, discussions with consultants, evaluation of patient's response to treatment, examination of patient, ordering and review of laboratory studies, ordering and review of radiographic studies, ordering and performing treatments and interventions, pulse oximetry, re-evaluation of patient's condition and review of old charts   Patient's presentation is most consistent with acute presentation with potential threat to life or bodily function.   MEDICATIONS ORDERED IN ED: Medications  nitroGLYCERIN (NITROSTAT) SL tablet 0.4 mg (0.4 mg Sublingual Given 09/03/22 2138)  heparin bolus via infusion 4,000 Units (4,000 Units Intravenous Bolus from Bag 09/03/22 2147)    Followed by   heparin ADULT infusion 100 units/mL (25000 units/212mL) (1,200 Units/hr Intravenous Restarted 09/03/22 2252)  tamsulosin (FLOMAX) capsule 0.4 mg (0.4 mg Oral Given 09/03/22 2254)  aspirin EC tablet 81 mg (has no administration in time range)  acetaminophen (TYLENOL) tablet 650 mg (has no administration in time range)  ondansetron (ZOFRAN) injection 4 mg (has no administration in time range)  0.9 %  sodium chloride infusion ( Intravenous New Bag/Given 09/03/22 2251)  metoprolol tartrate (LOPRESSOR) tablet 25 mg (25 mg Oral Given 09/03/22 2254)  atorvastatin (LIPITOR) tablet 80 mg (has no administration in time range)  ALPRAZolam (XANAX) tablet 0.25 mg (has no administration in time range)  traZODone (DESYREL) tablet 25 mg (has no administration in time range)  magnesium hydroxide (MILK OF MAGNESIA) suspension  30 mL (has no administration in time range)  aspirin chewable tablet 324 mg (324 mg Oral Given 09/03/22 2124)  nitroGLYCERIN (NITROGLYN) 2 % ointment 1 inch (1 inch Topical Given 09/03/22 2203)    FINAL CLINICAL IMPRESSION(S) / ED DIAGNOSES   Final diagnoses:  NSTEMI (non-ST elevated myocardial infarction) (HCC)     Rx / DC Orders   ED Discharge Orders     None        Note:  This document was prepared using Dragon voice recognition software and may include unintentional dictation errors.   Corena Herter, MD 09/04/22 [phone]

## 2022-09-03 NOTE — ED Notes (Signed)
ED Provider at bedside. 

## 2022-09-03 NOTE — Assessment & Plan Note (Signed)
>>  ASSESSMENT AND PLAN FOR STAGE III CHRONIC KIDNEY DISEASE (HCC) WRITTEN ON 09/03/2022 11:27 PM BY MANSY, JAN A, MD  - This is currently stage IIIa chronic kidney disease. - He will be gently hydrated with IV normal saline. - We will follow BMP and avoid nephrotoxins.

## 2022-09-03 NOTE — Assessment & Plan Note (Signed)
-   We will start p.o. Lopressor and place the patient on as needed labetalol. - He is currently on Nitropaste.

## 2022-09-03 NOTE — ED Triage Notes (Signed)
Pt here with SOB and cp that started 15 mins ago. Pt thought he was heaving heartburn and took an antacid but states the pain has not went away. Pt states pain does not radiate. Pt not able to describe pain, just states it hurts.

## 2022-09-03 NOTE — Assessment & Plan Note (Signed)
-   The patient will be admitted to a progressive unit bed. - Will follow serial troponins and EKGs. - The patient will be placed on aspirin as well as p.r.n. sublingual nitroglycerin and morphine sulfate for pain. - We will add p.o. Lopressor and high-dose statin. - We will check fasting lipids and lipoprotein-A. - We will obtain a cardiology consult in a.m. for further cardiac risk stratification. - I notified Dr. Nelly Laurence about the patient.

## 2022-09-04 ENCOUNTER — Encounter: Payer: Self-pay | Admitting: Family Medicine

## 2022-09-04 ENCOUNTER — Inpatient Hospital Stay (HOSPITAL_COMMUNITY)
Admit: 2022-09-04 | Discharge: 2022-09-12 | DRG: 233 | Disposition: A | Discharge status: Home / Self Care | Payer: HMO | Source: Other Acute Inpatient Hospital | Attending: Surgery | Admitting: Surgery

## 2022-09-04 ENCOUNTER — Encounter (HOSPITAL_COMMUNITY): Payer: Self-pay

## 2022-09-04 ENCOUNTER — Encounter
Admission: EM | Disposition: A | Discharge status: Other Acute Inpatient Hospital | Payer: Self-pay | Source: Home / Self Care | Attending: Family Medicine

## 2022-09-04 DIAGNOSIS — N1832 Chronic kidney disease, stage 3b: Secondary | ICD-10-CM | POA: Diagnosis not present

## 2022-09-04 DIAGNOSIS — I952 Hypotension due to drugs: Secondary | ICD-10-CM | POA: Diagnosis not present

## 2022-09-04 DIAGNOSIS — Z9841 Cataract extraction status, right eye: Secondary | ICD-10-CM

## 2022-09-04 DIAGNOSIS — I309 Acute pericarditis, unspecified: Secondary | ICD-10-CM | POA: Diagnosis present

## 2022-09-04 DIAGNOSIS — Z9842 Cataract extraction status, left eye: Secondary | ICD-10-CM | POA: Diagnosis not present

## 2022-09-04 DIAGNOSIS — I452 Bifascicular block: Secondary | ICD-10-CM | POA: Diagnosis not present

## 2022-09-04 DIAGNOSIS — Z880 Allergy status to penicillin: Secondary | ICD-10-CM

## 2022-09-04 DIAGNOSIS — Z981 Arthrodesis status: Secondary | ICD-10-CM | POA: Diagnosis not present

## 2022-09-04 DIAGNOSIS — K521 Toxic gastroenteritis and colitis: Secondary | ICD-10-CM | POA: Diagnosis not present

## 2022-09-04 DIAGNOSIS — I2510 Atherosclerotic heart disease of native coronary artery without angina pectoris: Secondary | ICD-10-CM | POA: Diagnosis present

## 2022-09-04 DIAGNOSIS — N401 Enlarged prostate with lower urinary tract symptoms: Secondary | ICD-10-CM | POA: Diagnosis present

## 2022-09-04 DIAGNOSIS — Z79899 Other long term (current) drug therapy: Secondary | ICD-10-CM

## 2022-09-04 DIAGNOSIS — J9811 Atelectasis: Secondary | ICD-10-CM | POA: Diagnosis not present

## 2022-09-04 DIAGNOSIS — E876 Hypokalemia: Secondary | ICD-10-CM | POA: Diagnosis present

## 2022-09-04 DIAGNOSIS — Z888 Allergy status to other drugs, medicaments and biological substances status: Secondary | ICD-10-CM

## 2022-09-04 DIAGNOSIS — I255 Ischemic cardiomyopathy: Secondary | ICD-10-CM | POA: Diagnosis present

## 2022-09-04 DIAGNOSIS — Z85828 Personal history of other malignant neoplasm of skin: Secondary | ICD-10-CM

## 2022-09-04 DIAGNOSIS — I517 Cardiomegaly: Secondary | ICD-10-CM | POA: Diagnosis not present

## 2022-09-04 DIAGNOSIS — T50995A Adverse effect of other drugs, medicaments and biological substances, initial encounter: Secondary | ICD-10-CM | POA: Diagnosis not present

## 2022-09-04 DIAGNOSIS — N1831 Chronic kidney disease, stage 3a: Secondary | ICD-10-CM | POA: Diagnosis present

## 2022-09-04 DIAGNOSIS — G249 Dystonia, unspecified: Secondary | ICD-10-CM | POA: Diagnosis present

## 2022-09-04 DIAGNOSIS — Z1152 Encounter for screening for COVID-19: Secondary | ICD-10-CM

## 2022-09-04 DIAGNOSIS — Z951 Presence of aortocoronary bypass graft: Secondary | ICD-10-CM | POA: Diagnosis not present

## 2022-09-04 DIAGNOSIS — I5020 Unspecified systolic (congestive) heart failure: Secondary | ICD-10-CM | POA: Diagnosis present

## 2022-09-04 DIAGNOSIS — Z86010 Personal history of colonic polyps: Secondary | ICD-10-CM

## 2022-09-04 DIAGNOSIS — Y92239 Unspecified place in hospital as the place of occurrence of the external cause: Secondary | ICD-10-CM | POA: Diagnosis not present

## 2022-09-04 DIAGNOSIS — Z9049 Acquired absence of other specified parts of digestive tract: Secondary | ICD-10-CM | POA: Diagnosis not present

## 2022-09-04 DIAGNOSIS — I160 Hypertensive urgency: Secondary | ICD-10-CM | POA: Diagnosis present

## 2022-09-04 DIAGNOSIS — I214 Non-ST elevation (NSTEMI) myocardial infarction: Secondary | ICD-10-CM | POA: Diagnosis present

## 2022-09-04 DIAGNOSIS — F05 Delirium due to known physiological condition: Secondary | ICD-10-CM | POA: Diagnosis not present

## 2022-09-04 DIAGNOSIS — I2585 Chronic coronary microvascular dysfunction: Secondary | ICD-10-CM

## 2022-09-04 DIAGNOSIS — E782 Mixed hyperlipidemia: Secondary | ICD-10-CM | POA: Diagnosis present

## 2022-09-04 DIAGNOSIS — I161 Hypertensive emergency: Secondary | ICD-10-CM

## 2022-09-04 DIAGNOSIS — Z7982 Long term (current) use of aspirin: Secondary | ICD-10-CM

## 2022-09-04 DIAGNOSIS — R570 Cardiogenic shock: Secondary | ICD-10-CM | POA: Diagnosis present

## 2022-09-04 DIAGNOSIS — I252 Old myocardial infarction: Secondary | ICD-10-CM | POA: Diagnosis not present

## 2022-09-04 DIAGNOSIS — F419 Anxiety disorder, unspecified: Secondary | ICD-10-CM | POA: Diagnosis present

## 2022-09-04 DIAGNOSIS — T504X5A Adverse effect of drugs affecting uric acid metabolism, initial encounter: Secondary | ICD-10-CM | POA: Diagnosis not present

## 2022-09-04 DIAGNOSIS — N179 Acute kidney failure, unspecified: Secondary | ICD-10-CM | POA: Diagnosis not present

## 2022-09-04 DIAGNOSIS — R7303 Prediabetes: Secondary | ICD-10-CM | POA: Diagnosis present

## 2022-09-04 DIAGNOSIS — R918 Other nonspecific abnormal finding of lung field: Secondary | ICD-10-CM | POA: Diagnosis not present

## 2022-09-04 DIAGNOSIS — I319 Disease of pericardium, unspecified: Secondary | ICD-10-CM | POA: Diagnosis present

## 2022-09-04 DIAGNOSIS — I4891 Unspecified atrial fibrillation: Secondary | ICD-10-CM | POA: Diagnosis not present

## 2022-09-04 DIAGNOSIS — N138 Other obstructive and reflux uropathy: Secondary | ICD-10-CM | POA: Diagnosis present

## 2022-09-04 DIAGNOSIS — N183 Chronic kidney disease, stage 3 unspecified: Secondary | ICD-10-CM | POA: Diagnosis not present

## 2022-09-04 DIAGNOSIS — Z48812 Encounter for surgical aftercare following surgery on the circulatory system: Secondary | ICD-10-CM | POA: Diagnosis not present

## 2022-09-04 DIAGNOSIS — Z8249 Family history of ischemic heart disease and other diseases of the circulatory system: Secondary | ICD-10-CM

## 2022-09-04 DIAGNOSIS — Z833 Family history of diabetes mellitus: Secondary | ICD-10-CM

## 2022-09-04 DIAGNOSIS — Z01810 Encounter for preprocedural cardiovascular examination: Secondary | ICD-10-CM | POA: Diagnosis not present

## 2022-09-04 DIAGNOSIS — R451 Restlessness and agitation: Secondary | ICD-10-CM | POA: Diagnosis not present

## 2022-09-04 DIAGNOSIS — Z4682 Encounter for fitting and adjustment of non-vascular catheter: Secondary | ICD-10-CM | POA: Diagnosis not present

## 2022-09-04 DIAGNOSIS — I25110 Atherosclerotic heart disease of native coronary artery with unstable angina pectoris: Secondary | ICD-10-CM | POA: Diagnosis not present

## 2022-09-04 DIAGNOSIS — I129 Hypertensive chronic kidney disease with stage 1 through stage 4 chronic kidney disease, or unspecified chronic kidney disease: Secondary | ICD-10-CM | POA: Diagnosis not present

## 2022-09-04 HISTORY — PX: LEFT HEART CATH AND CORONARY ANGIOGRAPHY: CATH118249

## 2022-09-04 LAB — CBC
HCT: 43.2 % (ref 39.0–52.0)
Hemoglobin: 14.8 g/dL (ref 13.0–17.0)
MCH: 31.9 pg (ref 26.0–34.0)
MCHC: 34.3 g/dL (ref 30.0–36.0)
MCV: 93.1 fL (ref 80.0–100.0)
Platelets: 226 10*3/uL (ref 150–400)
RBC: 4.64 MIL/uL (ref 4.22–5.81)
RDW: 12 % (ref 11.5–15.5)
WBC: 14.1 10*3/uL — ABNORMAL HIGH (ref 4.0–10.5)
nRBC: 0 % (ref 0.0–0.2)

## 2022-09-04 LAB — HEPARIN LEVEL (UNFRACTIONATED)
Heparin Unfractionated: 0.47 [IU]/mL (ref 0.30–0.70)
Heparin Unfractionated: 0.44 [IU]/mL (ref 0.30–0.70)

## 2022-09-04 LAB — LIPID PANEL
Cholesterol: 147 mg/dL (ref 0–200)
HDL: 32 mg/dL — ABNORMAL LOW
LDL Cholesterol: 80 mg/dL (ref 0–99)
Total CHOL/HDL Ratio: 4.6 ratio
Triglycerides: 177 mg/dL — ABNORMAL HIGH
VLDL: 35 mg/dL (ref 0–40)

## 2022-09-04 LAB — BASIC METABOLIC PANEL WITH GFR
Anion gap: 8 (ref 5–15)
BUN: 15 mg/dL (ref 8–23)
CO2: 21 mmol/L — ABNORMAL LOW (ref 22–32)
Calcium: 9.4 mg/dL (ref 8.9–10.3)
Chloride: 106 mmol/L (ref 98–111)
Creatinine, Ser: 1.12 mg/dL (ref 0.61–1.24)
GFR, Estimated: >60 mL/min
Glucose, Bld: 129 mg/dL — ABNORMAL HIGH (ref 70–99)
Potassium: 4.2 mmol/L (ref 3.5–5.1)
Sodium: 135 mmol/L (ref 135–145)

## 2022-09-04 SURGERY — LEFT HEART CATH AND CORONARY ANGIOGRAPHY
Anesthesia: Moderate Sedation

## 2022-09-04 MED ORDER — FENTANYL CITRATE (PF) 100 MCG/2ML IJ SOLN
INTRAMUSCULAR | Status: AC
  Filled 2022-09-04: qty 2

## 2022-09-04 MED ORDER — ASPIRIN 81 MG PO CHEW: 81.0000 mg | CHEWABLE_TABLET | ORAL | Status: DC

## 2022-09-04 MED ORDER — SODIUM CHLORIDE 0.9% FLUSH: 3.0000 mL | INTRAVENOUS | Status: DC | PRN

## 2022-09-04 MED ORDER — SODIUM CHLORIDE 0.9 % IV SOLN: INTRAVENOUS | Status: DC

## 2022-09-04 MED ORDER — ONDANSETRON HCL 4 MG/2ML IJ SOLN: 4.0000 mg | Freq: Four times a day (QID) | INTRAMUSCULAR | Status: DC | PRN

## 2022-09-04 MED ORDER — HEPARIN (PORCINE) 25000 UT/250ML-% IV SOLN
1450.0000 [IU]/h | INTRAVENOUS | Status: DC
  Administered 2022-09-04: 1200 [IU]/h via INTRAVENOUS
  Administered 2022-09-05: 1350 [IU]/h via INTRAVENOUS
  Administered 2022-09-06: 1450 [IU]/h via INTRAVENOUS
  Filled 2022-09-04 (×3): qty 250

## 2022-09-04 MED ORDER — HEPARIN (PORCINE) IN NACL 1000-0.9 UT/500ML-% IV SOLN
INTRAVENOUS | Status: AC
  Filled 2022-09-04: qty 1000

## 2022-09-04 MED ORDER — MIDAZOLAM HCL 2 MG/2ML IJ SOLN
INTRAMUSCULAR | Status: AC
  Filled 2022-09-04: qty 2

## 2022-09-04 MED ORDER — HYDRALAZINE HCL 20 MG/ML IJ SOLN: 10.0000 mg | INTRAMUSCULAR | Status: DC | PRN

## 2022-09-04 MED ORDER — TAMSULOSIN HCL 0.4 MG PO CAPS
0.4000 mg | ORAL_CAPSULE | Freq: Every day | ORAL | Status: DC
  Administered 2022-09-04 – 2022-09-11 (×8): 0.4 mg via ORAL
  Filled 2022-09-04 (×8): qty 1

## 2022-09-04 MED ORDER — NITROGLYCERIN 0.4 MG SL SUBL
0.4000 mg | SUBLINGUAL_TABLET | SUBLINGUAL | Status: DC | PRN
  Administered 2022-09-05 (×5): 0.4 mg via SUBLINGUAL
  Filled 2022-09-04 (×4): qty 1

## 2022-09-04 MED ORDER — ASPIRIN 81 MG PO TBEC: 81.0000 mg | DELAYED_RELEASE_TABLET | Freq: Every day | ORAL | 12 refills | Status: DC

## 2022-09-04 MED ORDER — MIDAZOLAM HCL 2 MG/2ML IJ SOLN
INTRAMUSCULAR | Status: DC | PRN
  Administered 2022-09-04: 1 mg via INTRAVENOUS

## 2022-09-04 MED ORDER — IMIPRAMINE HCL 25 MG PO TABS
50.0000 mg | ORAL_TABLET | Freq: Every day | ORAL | Status: DC
  Administered 2022-09-04 – 2022-09-10 (×6): 50 mg via ORAL
  Filled 2022-09-04 (×9): qty 2

## 2022-09-04 MED ORDER — ASPIRIN 81 MG PO TBEC
81.0000 mg | DELAYED_RELEASE_TABLET | Freq: Every day | ORAL | Status: DC
  Administered 2022-09-05: 81 mg via ORAL
  Filled 2022-09-04: qty 1

## 2022-09-04 MED ORDER — SODIUM CHLORIDE 0.9% FLUSH: 3.0000 mL | Freq: Two times a day (BID) | INTRAVENOUS | Status: DC

## 2022-09-04 MED ORDER — ATORVASTATIN CALCIUM 80 MG PO TABS: 80.0000 mg | ORAL_TABLET | Freq: Every day | ORAL | Status: DC

## 2022-09-04 MED ORDER — VERAPAMIL HCL 2.5 MG/ML IV SOLN
INTRAVENOUS | Status: DC | PRN
  Administered 2022-09-04 (×2): 2.5 mg via INTRA_ARTERIAL

## 2022-09-04 MED ORDER — ATORVASTATIN CALCIUM 80 MG PO TABS
80.0000 mg | ORAL_TABLET | Freq: Every day | ORAL | Status: DC
  Administered 2022-09-04 – 2022-09-11 (×8): 80 mg via ORAL
  Filled 2022-09-04 (×8): qty 1

## 2022-09-04 MED ORDER — SODIUM CHLORIDE 0.9 % IV SOLN: 250.0000 mL | INTRAVENOUS | Status: DC | PRN

## 2022-09-04 MED ORDER — ACETAMINOPHEN 325 MG PO TABS
650.0000 mg | ORAL_TABLET | ORAL | Status: DC | PRN
  Administered 2022-09-05 – 2022-09-07 (×4): 650 mg via ORAL
  Filled 2022-09-04 (×4): qty 2

## 2022-09-04 MED ORDER — FENTANYL CITRATE (PF) 100 MCG/2ML IJ SOLN
INTRAMUSCULAR | Status: DC | PRN
  Administered 2022-09-04: 25 ug via INTRAVENOUS

## 2022-09-04 MED ORDER — ASPIRIN 81 MG PO TBEC: 81.0000 mg | DELAYED_RELEASE_TABLET | Freq: Every day | ORAL | Status: DC

## 2022-09-04 MED ORDER — HEPARIN SODIUM (PORCINE) 1000 UNIT/ML IJ SOLN
INTRAMUSCULAR | Status: DC | PRN
  Administered 2022-09-04: 4500 [IU] via INTRAVENOUS

## 2022-09-04 MED ORDER — VERAPAMIL HCL 2.5 MG/ML IV SOLN
INTRAVENOUS | Status: AC
  Filled 2022-09-04: qty 2

## 2022-09-04 MED ORDER — METOPROLOL TARTRATE 25 MG PO TABS
25.0000 mg | ORAL_TABLET | Freq: Two times a day (BID) | ORAL | Status: DC
  Administered 2022-09-04 – 2022-09-06 (×4): 25 mg via ORAL
  Filled 2022-09-04 (×4): qty 1

## 2022-09-04 MED ORDER — METOPROLOL TARTRATE 25 MG PO TABS: 25.0000 mg | ORAL_TABLET | Freq: Two times a day (BID) | ORAL | Status: DC

## 2022-09-04 MED ORDER — LIDOCAINE HCL (PF) 1 % IJ SOLN
INTRAMUSCULAR | Status: DC | PRN
  Administered 2022-09-04: 2 mL

## 2022-09-04 MED ORDER — HEPARIN SODIUM (PORCINE) 1000 UNIT/ML IJ SOLN
INTRAMUSCULAR | Status: AC
  Filled 2022-09-04: qty 10

## 2022-09-04 MED ORDER — IOHEXOL 300 MG/ML  SOLN
INTRAMUSCULAR | Status: DC | PRN
  Administered 2022-09-04: 60 mL

## 2022-09-04 SURGICAL SUPPLY — 9 items
CATH INFINITI 5FR ANG PIGTAIL (CATHETERS) ×1
CATH INFINITI AMBI 5FR TG (CATHETERS) ×1
DEVICE RAD TR BAND REGULAR (VASCULAR PRODUCTS) ×1
DRAPE BRACHIAL (DRAPES) ×1
GLIDESHEATH SLEND SS 6F .021 (SHEATH) ×1
INQWIRE 1.5J .035X260CM (WIRE) ×1
PACK CARDIAC CATH (CUSTOM PROCEDURE TRAY) ×1
PROTECTION STATION PRESSURIZED (MISCELLANEOUS) ×1
SET ATX-X65L (MISCELLANEOUS) ×1

## 2022-09-04 NOTE — Discharge Summary (Signed)
Physician Discharge Summary   Patient: Rodney Hall. MRN: [id number] DOB: Aug 24, 1945  Admit date:     09/03/2022  Discharge date: 09/04/22  Discharge Physician: Lurene Shadow   PCP: Malva Limes, MD   Recommendations at discharge:   Follow-up with CT surgeon at [facility] within 24 hours of discharge  Discharge Diagnoses: Principal Problem:   NSTEMI (non-ST elevated myocardial infarction) [facility]) Active Problems:   Hypertensive urgency   Benign prostatic hyperplasia with urinary obstruction   Stage III chronic kidney disease (HCC)  Resolved Problems:   * No resolved hospital problems. *  Hospital Course:  Daeron Aardema. is a 77 y.o. Caucasian male with medical history significant for basal cell carcinoma, stage IIIa chronic kidney disease and BPH, who presented to the hospital with chest pain and shortness of breath. Troponins went from 11-5 99.  He was admitted to the hospital for acute NSTEMI.  He was treated with IV heparin drip.  He underwent left heart catheterization which showed severe three-vessel CAD.  Dr. Okey Dupre, cardiologist, recommended transfer to [facility] for evaluation for CABG. He also had hypertensive urgency with BP of 204/110 on admission.  Blood pressure has improved.  Chest pain and shortness of breath have resolved.  He is deemed stable for transfer to [facility].  Discharge plan was discussed with the patient and his son at the bedside.        Consultants: Cardiologist Procedures performed: Left heart cath Disposition:  [facility] Diet recommendation:  Discharge Diet Orders (From admission, onward)     Start     Ordered   09/04/22 0000  Diet - low sodium heart healthy        09/04/22 1358           Cardiac diet DISCHARGE MEDICATION: Allergies as of 09/04/2022       Reactions   Pravastatin    jittery   Amoxicillin Rash   ++ got ceftriaxone in the past++ Has patient had a PCN reaction causing  immediate rash, facial/tongue/throat swelling, SOB or lightheadedness with hypotension: Yes Has patient had a PCN reaction causing severe rash involving mucus membranes or skin necrosis: No Has patient had a PCN reaction that required hospitalization No Has patient had a PCN reaction occurring within the last 10 years: No If all of the above answers are "NO", then may proceed with Cephalosporin use.        Medication List     TAKE these medications    acetaminophen 325 MG tablet Commonly known as: TYLENOL Take 650 mg by mouth every 6 (six) hours as needed for moderate pain or headache.   aspirin EC 81 MG tablet Take 1 tablet (81 mg total) by mouth daily. Swallow whole. Start taking on: September 05, 2022   atorvastatin 80 MG tablet Commonly known as: LIPITOR Take 1 tablet (80 mg total) by mouth daily.   imipramine 50 MG tablet Commonly known as: TOFRANIL Take 50 mg by mouth at bedtime.   metoprolol tartrate 25 MG tablet Commonly known as: LOPRESSOR Take 1 tablet (25 mg total) by mouth 2 (two) times daily.   tamsulosin 0.4 MG Caps capsule Commonly known as: FLOMAX Take 0.4 mg by mouth daily after supper.        Discharge Exam: Filed Weights   09/03/22 1850  Weight: 85.5 kg   GEN: NAD SKIN: Warm and dry EYES: No pallor or icterus ENT: MMM CV: RRR PULM: CTA B  ABD: soft, ND, NT, +BS CNS: AAO x 3, non focal EXT: No edema or tenderness   Condition at discharge: good  The results of significant diagnostics from this hospitalization (including imaging, microbiology, ancillary and laboratory) are listed below for reference.   Imaging Studies: CARDIAC CATHETERIZATION  Result Date: 09/04/2022 Conclusions: Severe three-vessel coronary artery disease, as detailed below.  Culprit lesion for the patient's NSTEMI is most likely occluded distal segment of OM1.  There are also sequential 90% stenoses of the mid/distal LAD, 70% mid LCx lesion, and 80-90% ostial rPDA and  proximal rPLA stenoses. Moderately to severely reduced left ventricular systolic function (LVEF 30-35%) with apical dyskinesis. Upper normal left ventricular filling pressure (LVEDP 15 mmHg). Recommendations: Transfer to [facility] for cardiac surgery consultation for CABG. Restart heparin infusion 2 hours after TR band removal.  Defer addition of P2Y12 inhibitor pending cardiac surgery consultation. Aggressive secondary prevention of coronary artery disease. Maintain net even fluid balance and escalate goal-directed medical therapy for HFrEF as tolerated. Follow-up echocardiogram. Yvonne Kendall, MD [facility]  DG Chest 2 View  Result Date: 09/03/2022 CLINICAL DATA:  Chest pain, shortness of breath EXAM: CHEST - 2 VIEW COMPARISON:  None Available. FINDINGS: Lungs are clear.  No pleural effusion or pneumothorax. The heart is normal in size. Visualized osseous structures are within normal limits. IMPRESSION: Normal chest radiographs. Electronically Signed   By: Charline Bills M.D.   On: 09/03/2022 19:16    Microbiology: Results for orders placed or performed in visit on 12/10/18  Novel Coronavirus, NAA ([facility])     Status: None   Collection Time: 12/10/18 12:00 AM   Specimen: Nasopharyngeal(NP) swabs in vial transport medium   NASOPHARYNGE  TESTING  Result Value Ref Range Status   SARS-CoV-2, NAA Not Detected Not Detected Final    Comment: Testing was performed using the cobas(R) SARS-CoV-2 test. This nucleic acid amplification test was developed and its performance characteristics determined by [employer]. Nucleic acid amplification tests include PCR and TMA. This test has not been FDA cleared or approved. This test has been authorized by FDA under an Emergency Use Authorization (EUA). This test is only authorized for the duration of time the declaration that circumstances exist justifying the authorization of the emergency use of in vitro diagnostic tests for detection of  SARS-CoV-2 virus and/or diagnosis of COVID-19 infection under section 564(b)(1) of the Act, 21 U.S.C. 161WRU-0(A) (1), unless the authorization is terminated or revoked sooner. When diagnostic testing is negative, the possibility of a false negative result should be considered in the context of a patient's recent exposures and the presence of clinical signs and symptoms consistent with COVID-19. An individual without symptoms  of COVID-19 and who is not shedding SARS-CoV-2 virus would expect to have a negative (not detected) result in this assay.     Labs: CBC: Recent Labs  Lab 09/03/22 1852 09/04/22 0300  WBC 10.2 14.1*  HGB 16.8 14.8  HCT 49.0 43.2  MCV 92.6 93.1  PLT 243 226   Basic Metabolic Panel: Recent Labs  Lab 09/03/22 1852 09/04/22 0300  NA 135 135  K 4.0 4.2  CL 101 106  CO2 24 21*  GLUCOSE 133* 129*  BUN 18 15  CREATININE 1.41* 1.12  CALCIUM 9.8 9.4   Liver Function Tests: No results for input(s): "AST", "ALT", "ALKPHOS", "BILITOT", "PROT", "ALBUMIN" in the last 168 hours. CBG: No results for input(s): "GLUCAP" in the last 168 hours.  Discharge time spent: greater than 30 minutes.  Signed: Lurene Shadow, MD [facility] 09/04/2022

## 2022-09-04 NOTE — H&P (View-Only) (Signed)
Cardiology Consultation   Patient ID: Rodney Hall. MRN: [id number]; DOB: August 24, 1945  Admit date: 09/03/2022 Date of Consult: 09/04/2022  PCP:  Malva Limes, MD   [facility] Providers Cardiologist:  None      New consult completed by Dr Mariah Milling  Patient Profile:   Rodney Hall. is a 77 y.o. male with a hx of basal cell carcinoma, CKD IIIa, BPH,  who is being seen 09/04/2022 for the evaluation of chest pain at the request of Dr. Arville Care.  History of Present Illness:   Rodney Hall patient presented to the [facility] emergency department with complaints of shortness of breath and chest pain that started approximately 15 minutes prior to leaving his home.  Initially he thought he was having heartburn and took antiacid but states the patient did not go away.  He described the pain as a hurting sensation, that did not radiate, and there were no alleviating or aggravating factors.  He stated that he had been outside getting his grill situated to prepare dinner for he and his wife.  His wife actually drove him to the emergency department on arrival he stated that his pain was 5 out of 10 on the pain scale.  He was given 3 sublingual nitroglycerin and then placed on nitro placed and his pain had resolved.  He states he had no history of coronary artery disease and not had any prior testing or history of cardiac catheterization.  He stated that he became short of breath only with the sensation he did have some associated nausea and broke out into a sweat.  Stated that prior to his episode he had been in his normal state of health.  On evaluation this morning he was noted to be chest pain-free  Initial vital signs blood pressure 204/110, pulse of 86, respirations are 18, temperature is 97.9  Pertinent labs: Blood glucose 133, serum creatinine 1.41, GFR 51, high-sensitivity troponin of 599  Imaging: X-ray revealed normal findings  Medications administered in the emergency department:  Nitrostat sometimes 3 tablets, heparin bolus and heparin infusion, aspirin, Nitropaste was also placed   Past Medical History:  Diagnosis Date   Abdominal mass    Abdominal pain 06/02/2017   Anxiety    Pt. denies   Arthritis    pt. 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    Past Surgical History:  Procedure Laterality Date   ANTERIOR LAT LUMBAR FUSION Right 07/30/2016   Procedure: Right Lateral two-three Lateral transpsoas interbody fusion with lateral plating/Minimally invasive decompression at Lumbar two-three;  Surgeon: Ditty, Loura Halt, MD;  Location: [facility] OR;  Service: Neurosurgery;  Laterality: Right;   BACK SURGERY  2012   CATARACT EXTRACTION, BILATERAL     CHOLECYSTECTOMY N/A 07/25/2017   Procedure: LAPAROSCOPIC CHOLECYSTECTOMY WITH INTRAOPERATIVE CHOLANGIOGRAM;  Surgeon: Luretha Murphy, MD;  Location: [facility] ORS;  Service: General;  Laterality: N/A;   COLONOSCOPY WITH PROPOFOL N/A 12/12/2017   Procedure: COLONOSCOPY WITH PROPOFOL;  Surgeon: Scot Jun, MD;  Location: [facility] ENDOSCOPY;  Service: Endoscopy;  Laterality: N/A;   EUS N/A  07/10/2017   Procedure: UPPER ENDOSCOPIC ULTRASOUND (EUS) LINEAR;  Surgeon: Rachael Fee, MD;  Location: [facility] ENDOSCOPY;  Service: Endoscopy;  Laterality: N/A;   EYE SURGERY     HEMI-MICRODISCECTOMY LUMBAR LAMINECTOMY LEVEL 1  09/2018   [facility]   NECK SURGERY  2000   TENNIS ELBOW RELEASE/NIRSCHEL PROCEDURE     TONSILLECTOMY AND ADENOIDECTOMY       Home Medications:  Prior to Admission medications   Medication Sig Start Date End Date Taking? Authorizing Provider   acetaminophen (TYLENOL) 325 MG tablet Take 650 mg by mouth every 6 (six) hours as needed for moderate pain or headache.   Yes [provider]  imipramine (TOFRANIL) 50 MG tablet Take 50 mg by mouth at bedtime.   Yes [provider]  tamsulosin (FLOMAX) 0.4 MG CAPS capsule Take 0.4 mg by mouth daily after supper.    Yes [provider]    Inpatient Medications: Scheduled Meds:  aspirin EC  81 mg Oral Daily   atorvastatin  80 mg Oral Daily   metoprolol tartrate  25 mg Oral BID   tamsulosin  0.4 mg Oral QPC supper   Continuous Infusions:  sodium chloride 100 mL/hr at 09/03/22 2251   heparin 1,200 Units/hr (09/03/22 2252)   PRN Meds: acetaminophen, ALPRAZolam, magnesium hydroxide, nitroGLYCERIN, ondansetron (ZOFRAN) IV, traZODone  Allergies:    Allergies  Allergen Reactions   Pravastatin     jittery   Amoxicillin Rash    ++ got ceftriaxone in the past++ Has patient had a PCN reaction causing immediate rash, facial/tongue/throat swelling, SOB or lightheadedness with hypotension: Yes Has patient had a PCN reaction causing severe rash involving mucus membranes or skin necrosis: No Has patient had a PCN reaction that required hospitalization No Has patient had a PCN reaction occurring within the last 10 years: No If all of the above answers are "NO", then may proceed with Cephalosporin use.      Social History:   Social History   Socioeconomic History   Marital status: Married    Spouse name: Not on file   Number of children: Not on file   Years of education: Not on file   Highest education level: Not on file  Occupational History   Not on file  Tobacco Use   Smoking status: Never   Smokeless tobacco: Never  Vaping Use   Vaping Use: Never used  Substance and Sexual Activity   Alcohol use: No    Alcohol/week: 0.0 standard drinks of alcohol   Drug use: No   Sexual activity: Yes  Other Topics Concern   Not on file  Social History Narrative    Not on file   Social Determinants of Health   Financial Resource Strain: Low Risk  (07/03/2022)   Overall Financial Resource Strain (CARDIA)    Difficulty of Paying Living Expenses: Not hard at all  Food Insecurity: No Food Insecurity (07/03/2022)   Hunger Vital Sign    Worried About Running Out of Food in the Last Year: Never true    Ran Out of Food in the Last Year: Never true  Transportation Needs: No Transportation Needs (07/03/2022)   PRAPARE - Administrator, Civil Service (Medical): No    Lack of Transportation (Non-Medical): No  Physical Activity: Sufficiently Active (07/03/2022)   Exercise Vital Sign    Days of Exercise per Week: 5 days    Minutes of Exercise per Session: 60 min  Stress: No Stress Concern Present (07/03/2022)  [employer] of Occupational Health - Occupational Stress Questionnaire    Feeling of Stress : Not at all  Social Connections: Moderately Isolated (07/03/2022)   Social Connection and Isolation Panel [NHANES]    Frequency of Communication with Friends and Family: More than three times a week    Frequency of Social Gatherings with Friends and Family: Three times a week    Attends Religious Services: Never    Active Member of Clubs or Organizations: No    Attends Banker Meetings: Never    Marital Status: Married  Catering manager Violence: Not At Risk (07/03/2022)   Humiliation, Afraid, Rape, and Kick questionnaire    Fear of Current or Ex-Partner: No    Emotionally Abused: No    Physically Abused: No    Sexually Abused: No    Family History:    Family History  Problem Relation Age of Onset   Cancer Mother        brain   Diabetes Father    Rheumatic fever Sister    Heart disease Sister    Diabetes Brother      ROS:  Please see the history of present illness.  Review of Systems  Respiratory:  Positive for shortness of breath.   Cardiovascular:  Positive for chest pain.  Gastrointestinal:  Positive for nausea.     All other ROS reviewed and negative.     Physical Exam/Data:   Vitals:   09/04/22 0255 09/04/22 0500 09/04/22 0700 09/04/22 0745  BP: 138/63 122/73 121/68 128/75  Pulse: (!) 59 (!) 58 (!) 59 61  Resp: (!) 21 15 (!) 23 (!) 26  Temp: 98.3 F (36.8 C)   97.7 F (36.5 C)  TempSrc: Oral   Oral  SpO2: 95% 96% 96% 98%  Weight:      Height:        Intake/Output Summary (Last 24 hours) at 09/04/2022 0801 Last data filed at 09/04/2022 0731 Gross per 24 hour  Intake --  Output 200 ml  Net -200 ml      09/03/2022    6:50 PM 07/03/2022   11:13 AM 02/22/2022    9:47 AM  Last 3 Weights  Weight (lbs) 188 lb 7.9 oz 188 lb 9.6 oz 198 lb  Weight (kg) 85.5 kg 85.548 kg 89.812 kg     Body mass index is 27.05 kg/m.  General:  Well nourished, well developed, in no acute distress HEENT: normal Neck: no JVD Vascular: No carotid bruits; Distal pulses 2+ bilaterally Cardiac:  normal S1, S2; RRR; no murmur  Lungs:  clear to auscultation bilaterally, no wheezing, rhonchi or rales  Abd: soft, nontender, no hepatomegaly  Ext: no edema Musculoskeletal:  No deformities, BUE and BLE strength normal and equal Skin: warm and dry  Neuro:  CNs 2-12 intact, no focal abnormalities noted Psych:  Normal affect   EKG:  The EKG was personally reviewed and demonstrates: Sinus rhythm with LVH, and ST changes Telemetry:  Telemetry was personally reviewed and demonstrates: Sinus rhythm with sinus bradycardia  Relevant CV Studies: Echocardiogram ordered and pending  Laboratory Data:  High Sensitivity Troponin:   Recent Labs  Lab 09/03/22 1852 09/03/22 2045  TROPONINIHS 11 599*     Chemistry Recent Labs  Lab 09/03/22 1852 09/04/22 0300  NA 135 135  K 4.0 4.2  CL 101 106  CO2 24 21*  GLUCOSE 133* 129*  BUN 18 15  CREATININE 1.41* 1.12  CALCIUM 9.8 9.4  GFRNONAA 51* >60  ANIONGAP 10 8    No results for input(s): "PROT", "ALBUMIN", "AST", "ALT", "ALKPHOS", "BILITOT" in the last 168  hours. Lipids  Recent Labs  Lab 09/04/22 0300  CHOL 147  TRIG 177*  HDL 32*  LDLCALC 80  CHOLHDL 4.6    Hematology Recent Labs  Lab 09/03/22 1852 09/04/22 0300  WBC 10.2 14.1*  RBC 5.29 4.64  HGB 16.8 14.8  HCT 49.0 43.2  MCV 92.6 93.1  MCH 31.8 31.9  MCHC 34.3 34.3  RDW 11.9 12.0  PLT 243 226   Thyroid No results for input(s): "TSH", "FREET4" in the last 168 hours.  BNPNo results for input(s): "BNP", "PROBNP" in the last 168 hours.  DDimer No results for input(s): "DDIMER" in the last 168 hours.   Radiology/Studies:  DG Chest 2 View  Result Date: 09/03/2022 CLINICAL DATA:  Chest pain, shortness of breath EXAM: CHEST - 2 VIEW COMPARISON:  None Available. FINDINGS: Lungs are clear.  No pleural effusion or pneumothorax. The heart is normal in size. Visualized osseous structures are within normal limits. IMPRESSION: Normal chest radiographs. Electronically Signed   By: Charline Bills M.D.   On: 09/03/2022 19:16     Assessment and Plan:   NSTEMI -Presented with complaints of chest discomfort that relieved with nitroglycerin -Currently remains chest pain-free -High-sensitivity troponins trended, 11 and 599 -Remained n.p.o. -Continued on heparin infusion, aspirin, statin -Scheduled for left heart catheterization today with further recommendations to follow -Echocardiogram is scheduled with further recommendations to follow -EKG as needed for pain or changes -Continued on metoprolol 25 mg twice daily -Continued on telemetry monitoring  CKD stage IIIa -Serum creatinine is 1.12 -Continued on IV fluid for gentle hydration -Daily BMP -Monitor urine output -Monitor/trend/replete electrolytes as needed -Avoid nephrotoxic agents were able   Risk Assessment/Risk Scores:            For questions or updates, please contact  [facility] Please consult [email or web address] for contact info under    Signed, Tristin Vandeusen, NP  09/04/2022 8:01 AM

## 2022-09-04 NOTE — OR Nursing (Signed)
[facility] arrived. Pt to transfer to [place] [facility]. TR band fully deflated. Old blood noted from bleed an hr ago but site stable.

## 2022-09-04 NOTE — Progress Notes (Deleted)
Please see addendum on cardiology consult

## 2022-09-04 NOTE — Consult Note (Signed)
 Cardiology Consultation   Patient ID: Rodney L Balaguer Jr. MRN: [id number]; DOB: 12/25/1945  Admit date: 09/03/2022 Date of Consult: 09/04/2022  PCP:  Fisher, Donald E, MD   [facility] Providers Cardiologist:  None      New consult completed by Dr Gollan  Patient Profile:   Rodney L Mcelveen Jr. is a 77 y.o. male with a hx of basal cell carcinoma, CKD IIIa, BPH,  who is being seen 09/04/2022 for the evaluation of chest pain at the request of Dr. Mansy.  History of Present Illness:   [name] patient presented to the [facility] emergency department with complaints of shortness of breath and chest pain that started approximately 15 minutes prior to leaving his home.  Initially he thought he was having heartburn and took antiacid but states the patient did not go away.  He described the pain as a hurting sensation, that did not radiate, and there were no alleviating or aggravating factors.  He stated that he had been outside getting his grill situated to prepare dinner for he and his wife.  His wife actually drove him to the emergency department on arrival he stated that his pain was 5 out of 10 on the pain scale.  He was given 3 sublingual nitroglycerin and then placed on nitro placed and his pain had resolved.  He states he had no history of coronary artery disease and not had any prior testing or history of cardiac catheterization.  He stated that he became short of breath only with the sensation he did have some associated nausea and broke out into a sweat.  Stated that prior to his episode he had been in his normal state of health.  On evaluation this morning he was noted to be chest pain-free  Initial vital signs blood pressure 204/110, pulse of 86, respirations are 18, temperature is 97.9  Pertinent labs: Blood glucose 133, serum creatinine 1.41, GFR 51, high-sensitivity troponin of 599  Imaging: X-ray revealed normal findings  Medications administered in the emergency department:  Nitrostat sometimes 3 tablets, heparin bolus and heparin infusion, aspirin, Nitropaste was also placed   Past Medical History:  Diagnosis Date   Abdominal mass    Abdominal pain 06/02/2017   Anxiety    Pt. denies   Arthritis    pt. 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    Past Surgical History:  Procedure Laterality Date   ANTERIOR LAT LUMBAR FUSION Right 07/30/2016   Procedure: Right Lateral two-three Lateral transpsoas interbody fusion with lateral plating/Minimally invasive decompression at Lumbar two-three;  Surgeon: Ditty, Benjamin Jared, MD;  Location: [facility] OR;  Service: Neurosurgery;  Laterality: Right;   BACK SURGERY  2012   CATARACT EXTRACTION, BILATERAL     CHOLECYSTECTOMY N/A 07/25/2017   Procedure: LAPAROSCOPIC CHOLECYSTECTOMY WITH INTRAOPERATIVE CHOLANGIOGRAM;  Surgeon: Martin, Matthew, MD;  Location: [facility] ORS;  Service: General;  Laterality: N/A;   COLONOSCOPY WITH PROPOFOL N/A 12/12/2017   Procedure: COLONOSCOPY WITH PROPOFOL;  Surgeon: Elliott, Robert T, MD;  Location: [facility] ENDOSCOPY;  Service: Endoscopy;  Laterality: N/A;   EUS N/A   07/10/2017   Procedure: UPPER ENDOSCOPIC ULTRASOUND (EUS) LINEAR;  Surgeon: Jacobs, Daniel P, MD;  Location: [facility] ENDOSCOPY;  Service: Endoscopy;  Laterality: N/A;   EYE SURGERY     HEMI-MICRODISCECTOMY LUMBAR LAMINECTOMY LEVEL 1  09/2018   [facility]   NECK SURGERY  2000   TENNIS ELBOW RELEASE/NIRSCHEL PROCEDURE     TONSILLECTOMY AND ADENOIDECTOMY       Home Medications:  Prior to Admission medications   Medication Sig Start Date End Date Taking? Authorizing Provider   acetaminophen (TYLENOL) 325 MG tablet Take 650 mg by mouth every 6 (six) hours as needed for moderate pain or headache.   Yes [provider]  imipramine (TOFRANIL) 50 MG tablet Take 50 mg by mouth at bedtime.   Yes [provider]  tamsulosin (FLOMAX) 0.4 MG CAPS capsule Take 0.4 mg by mouth daily after supper.    Yes [provider]    Inpatient Medications: Scheduled Meds:  aspirin EC  81 mg Oral Daily   atorvastatin  80 mg Oral Daily   metoprolol tartrate  25 mg Oral BID   tamsulosin  0.4 mg Oral QPC supper   Continuous Infusions:  sodium chloride 100 mL/hr at 09/03/22 2251   heparin 1,200 Units/hr (09/03/22 2252)   PRN Meds: acetaminophen, ALPRAZolam, magnesium hydroxide, nitroGLYCERIN, ondansetron (ZOFRAN) IV, traZODone  Allergies:    Allergies  Allergen Reactions   Pravastatin     jittery   Amoxicillin Rash    ++ got ceftriaxone in the past++ Has patient had a PCN reaction causing immediate rash, facial/tongue/throat swelling, SOB or lightheadedness with hypotension: Yes Has patient had a PCN reaction causing severe rash involving mucus membranes or skin necrosis: No Has patient had a PCN reaction that required hospitalization No Has patient had a PCN reaction occurring within the last 10 years: No If all of the above answers are "NO", then may proceed with Cephalosporin use.      Social History:   Social History   Socioeconomic History   Marital status: Married    Spouse name: Not on file   Number of children: Not on file   Years of education: Not on file   Highest education level: Not on file  Occupational History   Not on file  Tobacco Use   Smoking status: Never   Smokeless tobacco: Never  Vaping Use   Vaping Use: Never used  Substance and Sexual Activity   Alcohol use: No    Alcohol/week: 0.0 standard drinks of alcohol   Drug use: No   Sexual activity: Yes  Other Topics Concern   Not on file  Social History Narrative    Not on file   Social Determinants of Health   Financial Resource Strain: Low Risk  (07/03/2022)   Overall Financial Resource Strain (CARDIA)    Difficulty of Paying Living Expenses: Not hard at all  Food Insecurity: No Food Insecurity (07/03/2022)   Hunger Vital Sign    Worried About Running Out of Food in the Last Year: Never true    Ran Out of Food in the Last Year: Never true  Transportation Needs: No Transportation Needs (07/03/2022)   PRAPARE - Transportation    Lack of Transportation (Medical): No    Lack of Transportation (Non-Medical): No  Physical Activity: Sufficiently Active (07/03/2022)   Exercise Vital Sign    Days of Exercise per Week: 5 days    Minutes of Exercise per Session: 60 min  Stress: No Stress Concern Present (07/03/2022)     [employer] - Occupational Stress Questionnaire    Feeling of Stress : Not at all  Social Connections: Moderately Isolated (07/03/2022)   Social Connection and Isolation Panel [NHANES]    Frequency of Communication with Friends and Family: More than three times a week    Frequency of Social Gatherings with Friends and Family: Three times a week    Attends Religious Services: Never    Active Member of Clubs or Organizations: No    Attends Club or Organization Meetings: Never    Marital Status: Married  Intimate Partner Violence: Not At Risk (07/03/2022)   Humiliation, Afraid, Rape, and Kick questionnaire    Fear of Current or Ex-Partner: No    Emotionally Abused: No    Physically Abused: No    Sexually Abused: No    Family History:    Family History  Problem Relation Age of Onset   Cancer Mother        brain   Diabetes Father    Rheumatic fever Sister    Heart disease Sister    Diabetes Brother      ROS:  Please see the history of present illness.  Review of Systems  Respiratory:  Positive for shortness of breath.   Cardiovascular:  Positive for chest pain.  Gastrointestinal:  Positive for nausea.     All other ROS reviewed and negative.     Physical Exam/Data:   Vitals:   09/04/22 0255 09/04/22 0500 09/04/22 0700 09/04/22 0745  BP: 138/63 122/73 121/68 128/75  Pulse: (!) 59 (!) 58 (!) 59 61  Resp: (!) 21 15 (!) 23 (!) 26  Temp: 98.3 F (36.8 C)   97.7 F (36.5 C)  TempSrc: Oral   Oral  SpO2: 95% 96% 96% 98%  Weight:      Height:        Intake/Output Summary (Last 24 hours) at 09/04/2022 0801 Last data filed at 09/04/2022 0731 Gross per 24 hour  Intake --  Output 200 ml  Net -200 ml      09/03/2022    6:50 PM 07/03/2022   11:13 AM 02/22/2022    9:47 AM  Last 3 Weights  Weight (lbs) 188 lb 7.9 oz 188 lb 9.6 oz 198 lb  Weight (kg) 85.5 kg 85.548 kg 89.812 kg     Body mass index is 27.05 kg/m.  General:  Well nourished, well developed, in no acute distress HEENT: normal Neck: no JVD Vascular: No carotid bruits; Distal pulses 2+ bilaterally Cardiac:  normal S1, S2; RRR; no murmur  Lungs:  clear to auscultation bilaterally, no wheezing, rhonchi or rales  Abd: soft, nontender, no hepatomegaly  Ext: no edema Musculoskeletal:  No deformities, BUE and BLE strength normal and equal Skin: warm and dry  Neuro:  CNs 2-12 intact, no focal abnormalities noted Psych:  Normal affect   EKG:  The EKG was personally reviewed and demonstrates: Sinus rhythm with LVH, and ST changes Telemetry:  Telemetry was personally reviewed and demonstrates: Sinus rhythm with sinus bradycardia  Relevant CV Studies: Echocardiogram ordered and pending  Laboratory Data:  High Sensitivity Troponin:   Recent Labs  Lab 09/03/22 1852 09/03/22 2045  TROPONINIHS 11 599*     Chemistry Recent Labs  Lab 09/03/22 1852 09/04/22 0300  NA 135 135  K 4.0 4.2  CL 101 106  CO2 24 21*  GLUCOSE 133* 129*  BUN 18 15  CREATININE 1.41* 1.12  CALCIUM 9.8 9.4  GFRNONAA 51* >60    ANIONGAP 10 8    No results for input(s): "PROT", "ALBUMIN", "AST", "ALT", "ALKPHOS", "BILITOT" in the last 168  hours. Lipids  Recent Labs  Lab 09/04/22 0300  CHOL 147  TRIG 177*  HDL 32*  LDLCALC 80  CHOLHDL 4.6    Hematology Recent Labs  Lab 09/03/22 1852 09/04/22 0300  WBC 10.2 14.1*  RBC 5.29 4.64  HGB 16.8 14.8  HCT 49.0 43.2  MCV 92.6 93.1  MCH 31.8 31.9  MCHC 34.3 34.3  RDW 11.9 12.0  PLT 243 226   Thyroid No results for input(s): "TSH", "FREET4" in the last 168 hours.  BNPNo results for input(s): "BNP", "PROBNP" in the last 168 hours.  DDimer No results for input(s): "DDIMER" in the last 168 hours.   Radiology/Studies:  DG Chest 2 View  Result Date: 09/03/2022 CLINICAL DATA:  Chest pain, shortness of breath EXAM: CHEST - 2 VIEW COMPARISON:  None Available. FINDINGS: Lungs are clear.  No pleural effusion or pneumothorax. The heart is normal in size. Visualized osseous structures are within normal limits. IMPRESSION: Normal chest radiographs. Electronically Signed   By: Sriyesh  Krishnan M.D.   On: 09/03/2022 19:16     Assessment and Plan:   NSTEMI -Presented with complaints of chest discomfort that relieved with nitroglycerin -Currently remains chest pain-free -High-sensitivity troponins trended, 11 and 599 -Remained n.p.o. -Continued on heparin infusion, aspirin, statin -Scheduled for left heart catheterization today with further recommendations to follow -Echocardiogram is scheduled with further recommendations to follow -EKG as needed for pain or changes -Continued on metoprolol 25 mg twice daily -Continued on telemetry monitoring  CKD stage IIIa -Serum creatinine is 1.12 -Continued on IV fluid for gentle hydration -Daily BMP -Monitor urine output -Monitor/trend/replete electrolytes as needed -Avoid nephrotoxic agents were able   Risk Assessment/Risk Scores:            For questions or updates, please contact [facility] Please consult [email or web address] for contact info under    Signed, Julion Gatt, NP  09/04/2022 8:01 AM  

## 2022-09-04 NOTE — Progress Notes (Signed)
ANTICOAGULATION CONSULT NOTE - Follow Up Consult  Pharmacy Consult for Heparin Indication: chest pain/ACS  / severe 3 vessel disease  Allergies  Allergen Reactions   Pravastatin     jittery   Amoxicillin Rash    ++ got ceftriaxone in the past++ Has patient had a PCN reaction causing immediate rash, facial/tongue/throat swelling, SOB or lightheadedness with hypotension: Yes Has patient had a PCN reaction causing severe rash involving mucus membranes or skin necrosis: No Has patient had a PCN reaction that required hospitalization No Has patient had a PCN reaction occurring within the last 10 years: No If all of the above answers are "NO", then may proceed with Cephalosporin use.      Vital Signs: Temp: 98.1 F (36.7 C) (07/03 1516) Temp Source: Oral (07/03 1516) BP: 143/81 (07/03 1516) Pulse Rate: 69 (07/03 1516)  Labs: Recent Labs    09/03/22 1852 09/03/22 2045 09/03/22 2129 09/04/22 0300 09/04/22 0724  HGB 16.8  --   --  14.8  --   HCT 49.0  --   --  43.2  --   PLT 243  --   --  226  --   APTT  --   --  34  --   --   LABPROT  --   --  14.2  --   --   INR  --   --  1.1  --   --   HEPARINUNFRC  --   --   --  0.47 0.44  CREATININE 1.41*  --   --  1.12  --   TROPONINIHS 11 599*  --   --   --     Estimated Creatinine Clearance: 57 mL/min (by C-G formula based on SCr of 1.12 mg/dL).  Assessment: Resuming heparin in preparation for CABG s/p cath  Goal of Therapy:  Heparin level 0.3-0.7 units/ml Monitor platelets by anticoagulation protocol: Yes   Plan:  Heparin at 1250 units / hr 8 hour heparin level Daily heparin level, CBC  Thank you Okey Regal, PharmD  Rodney Hall 09/04/2022,5:14 PM

## 2022-09-04 NOTE — H&P (Signed)
Cardiology Admission History and Physical   Patient ID: Rodney Hall. MRN: [id number]; DOB: 07-14-1945   Admission date: 09/04/2022  PCP:  Malva Limes, MD   [facility] Providers Cardiologist:  None    Click here to update MD or APP on Care Team, Refresh:1}     Chief Complaint:  NSTEMI  Patient Profile:   Rodney Birner. is a 77 y.o. male with hx of basal cell carcinoma, CKD IIIa, BPH, who was recently admitted for chest pain and underwent left heart catheterization and was found to have severe three-vessel disease who is being seen 09/04/2022 for the evaluation of three-vessel coronary artery disease.  History of Present Illness:   Rodney Hall presented to the [facility] emergency department with complaints of shortness of breath and chest pain that started approximately 15 minutes prior to leaving his home.  Initially he thought he was having heartburn and took antiacid but states the patient did not go away.  He described the pain as a hurting sensation, that did not radiate, and there were no alleviating or aggravating factors.  He stated that he had been outside getting his grill situated to prepare dinner for he and his wife.  His wife actually drove him to the emergency department on arrival he stated that his pain was 5 out of 10 on the pain scale.  He was given 3 sublingual nitroglycerin and then placed on nitro placed and his pain had resolved.  He states he had no history of coronary artery disease and not had any prior testing or history of cardiac catheterization.  He stated that he became short of breath only with the sensation he did have some associated nausea and broke out into a sweat.  Stated that prior to his episode he had been in his normal state of health.  On evaluation this morning he was noted to be chest pain-free   Initial vital signs blood pressure 204/110, pulse of 86, respirations are 18, temperature is 97.9   Pertinent labs: Blood glucose 133,  serum creatinine 1.41, GFR 51, high-sensitivity troponin of 599   Imaging: X-ray revealed normal findings   Medications administered in the emergency department: Nitrostat sometimes 3 tablets, heparin bolus and heparin infusion, aspirin, Nitropaste was also placed  He underwent left heart catheterization at [facility] by Dr. Okey Dupre was found to have severe three-vessel coronary artery disease with a culprit lesion for the patient's NSTEMI most likely an occluded distal segment of the OM1.  There was also a sequential 90% stenosis of the mid/distal LAD, 70% mid left circumflex lesion, and 80-90% ostial rPDA and proximal rPLA.  He also had noted moderately to severely reduced left ventricular systolic function (LVEF 30-35%) with apical dyskinesis.  Upper normal left ventricular filling pressure (LVEDP 15 mmHg).  Recommendation was to transfer to [facility] for cardiac surgery consultation for CABG.  Heparin infusion was recommended to be restarted 2 hours after the TR band was removed.  Recommended to maintain net even fluid balance and escalate goal-directed medical therapy for HFrEF as tolerated with a follow-up echocardiogram ordered.   Past Medical History:  Diagnosis Date   Abdominal mass    Abdominal pain 06/02/2017   Anxiety    Pt. denies   Arthritis    pt. denies   Basal cell carcinoma 01/21/2017   left lat forehead   BPH with obstruction/lower urinary tract symptoms    Cholecystitis 06/02/2017   Choledocholithiasis with acute cholecystitis  History of actinic keratoses    Interstitial cystitis    Pancreatic cyst    Pneumonia    27 years ago   Squamous cell carcinoma of skin 08/14/2016   left above alar crease sidewall of nose/in situ    Past Surgical History:  Procedure Laterality Date   ANTERIOR LAT LUMBAR FUSION Right 07/30/2016   Procedure: Right Lateral two-three Lateral transpsoas interbody fusion with lateral plating/Minimally invasive decompression at Lumbar two-three;   Surgeon: Ditty, Loura Halt, MD;  Location: [facility] OR;  Service: Neurosurgery;  Laterality: Right;   BACK SURGERY  2012   CATARACT EXTRACTION, BILATERAL     CHOLECYSTECTOMY N/A 07/25/2017   Procedure: LAPAROSCOPIC CHOLECYSTECTOMY WITH INTRAOPERATIVE CHOLANGIOGRAM;  Surgeon: Luretha Murphy, MD;  Location: [facility] ORS;  Service: General;  Laterality: N/A;   COLONOSCOPY WITH PROPOFOL N/A 12/12/2017   Procedure: COLONOSCOPY WITH PROPOFOL;  Surgeon: Scot Jun, MD;  Location: [facility] ENDOSCOPY;  Service: Endoscopy;  Laterality: N/A;   EUS N/A 07/10/2017   Procedure: UPPER ENDOSCOPIC ULTRASOUND (EUS) LINEAR;  Surgeon: Rachael Fee, MD;  Location: [facility] ENDOSCOPY;  Service: Endoscopy;  Laterality: N/A;   EYE SURGERY     HEMI-MICRODISCECTOMY LUMBAR LAMINECTOMY LEVEL 1  09/2018   [facility]   NECK SURGERY  2000   TENNIS ELBOW RELEASE/NIRSCHEL PROCEDURE     TONSILLECTOMY AND ADENOIDECTOMY       Medications Prior to Admission: Prior to Admission medications   Medication Sig Start Date End Date Taking? Authorizing Provider  acetaminophen (TYLENOL) 325 MG tablet Take 650 mg by mouth every 6 (six) hours as needed for moderate pain or headache.    [provider]  aspirin EC 81 MG tablet Take 1 tablet (81 mg total) by mouth daily. Swallow whole. 09/05/22   Lurene Shadow, MD  atorvastatin (LIPITOR) 80 MG tablet Take 1 tablet (80 mg total) by mouth daily. 09/04/22   Lurene Shadow, MD  imipramine (TOFRANIL) 50 MG tablet Take 50 mg by mouth at bedtime.    [provider]  metoprolol tartrate (LOPRESSOR) 25 MG tablet Take 1 tablet (25 mg total) by mouth 2 (two) times daily. 09/04/22   Lurene Shadow, MD  tamsulosin (FLOMAX) 0.4 MG CAPS capsule Take 0.4 mg by mouth daily after supper.     [provider]     Allergies:    Allergies  Allergen Reactions   Pravastatin     jittery   Amoxicillin Rash    ++ got ceftriaxone in the past++ Has patient had a PCN reaction causing  immediate rash, facial/tongue/throat swelling, SOB or lightheadedness with hypotension: Yes Has patient had a PCN reaction causing severe rash involving mucus membranes or skin necrosis: No Has patient had a PCN reaction that required hospitalization No Has patient had a PCN reaction occurring within the last 10 years: No If all of the above answers are "NO", then may proceed with Cephalosporin use.      Social History:   Social History   Socioeconomic History   Marital status: Married    Spouse name: Not on file   Number of children: Not on file   Years of education: Not on file   Highest education level: Not on file  Occupational History   Not on file  Tobacco Use   Smoking status: Never   Smokeless tobacco: Never  Vaping Use   Vaping Use: Never used  Substance and Sexual Activity   Alcohol use: No    Alcohol/week: 0.0 standard drinks of alcohol  Drug use: No   Sexual activity: Yes  Other Topics Concern   Not on file  Social History Narrative   Not on file   Social Determinants of Health   Financial Resource Strain: Low Risk  (07/03/2022)   Overall Financial Resource Strain (CARDIA)    Difficulty of Paying Living Expenses: Not hard at all  Food Insecurity: No Food Insecurity (09/04/2022)   Hunger Vital Sign    Worried About Running Out of Food in the Last Year: Never true    Ran Out of Food in the Last Year: Never true  Transportation Needs: No Transportation Needs (09/04/2022)   PRAPARE - Administrator, Civil Service (Medical): No    Lack of Transportation (Non-Medical): No  Physical Activity: Sufficiently Active (07/03/2022)   Exercise Vital Sign    Days of Exercise per Week: 5 days    Minutes of Exercise per Session: 60 min  Stress: No Stress Concern Present (07/03/2022)   [facility]ney Hall [employer] Occupational [employer] - Occupational Stress Questionnaire    Feeling of Stress : Not at all  Social Connections: Moderately Isolated (07/03/2022)   Social Connection  and Isolation Panel [NHANES]    Frequency of Communication with Friends and Family: More than three times a week    Frequency of Social Gatherings with Friends and Family: Three times a week    Attends Religious Services: Never    Active Member of Clubs or Organizations: No    Attends Banker Meetings: Never    Marital Status: Married  Catering manager Violence: Not At Risk (09/04/2022)   Humiliation, Afraid, Rape, and Kick questionnaire    Fear of Current or Ex-Partner: No    Emotionally Abused: No    Physically Abused: No    Sexually Abused: No    Family History:   The patient's family history includes Cancer in his mother; Diabetes in his brother and father; Heart disease in his sister; Rheumatic fever in his sister.    ROS:  Please see the history of present illness.  Review of Systems  Cardiovascular:  Positive for chest pain and leg swelling.   All other ROS reviewed and negative.     Physical Exam/Data:   Vitals:   09/04/22 1516  BP: (!) 143/81  Pulse: 69  Resp: 16  Temp: 98.1 F (36.7 C)  TempSrc: Oral  SpO2: 97%   No intake or output data in the 24 hours ending 09/04/22 1546    09/03/2022    6:50 PM 07/03/2022   11:13 AM 02/22/2022    9:47 AM  Last 3 Weights  Weight (lbs) 188 lb 7.9 oz 188 lb 9.6 oz 198 lb  Weight (kg) 85.5 kg 85.548 kg 89.812 kg     There is no height or weight on file to calculate BMI.  General:  Well nourished, well developed, in no acute distress HEENT: normal Neck: no JVD Vascular: No carotid bruits; Distal pulses 2+ bilaterally   Cardiac:  normal S1, S2; RRR; no murmur  Lungs:  clear to auscultation bilaterally, no wheezing, rhonchi or rales  Abd: soft, nontender, no hepatomegaly  Ext: no edema Musculoskeletal:  No deformities, BUE and BLE strength normal and equal Skin: warm and dry  Neuro:  CNs 2-12 intact, no focal abnormalities noted Psych:  Normal affect    EKG:  The ECG that was done 09/04/22 was personally  reviewed and demonstrates sinus rhythm with LVH and noted ST changes  Relevant CV Studies: [facility] 09/04/22  Conclusions: Severe three-vessel coronary artery disease, as detailed below.  Culprit lesion for the patient's NSTEMI is most likely occluded distal segment of OM1.  There are also sequential 90% stenoses of the mid/distal LAD, 70% mid LCx lesion, and 80-90% ostial rPDA and proximal rPLA stenoses. Moderately to severely reduced left ventricular systolic function (LVEF 30-35%) with apical dyskinesis. Upper normal left ventricular filling pressure (LVEDP 15 mmHg).   Recommendations: Transfer to [facility] for cardiac surgery consultation for CABG. Restart heparin infusion 2 hours after TR band removal.  Defer addition of P2Y12 inhibitor pending cardiac surgery consultation. Aggressive secondary prevention of coronary artery disease. Maintain net even fluid balance and escalate goal-directed medical therapy for HFrEF as tolerated. Follow-up echocardiogram.  Diagnostic Dominance: Right   Laboratory Data:  High Sensitivity Troponin:   Recent Labs  Lab 09/03/22 1852 09/03/22 2045  TROPONINIHS 11 599*      Chemistry Recent Labs  Lab 09/03/22 1852 09/04/22 0300  NA 135 135  K 4.0 4.2  CL 101 106  CO2 24 21*  GLUCOSE 133* 129*  BUN 18 15  CREATININE 1.41* 1.12  CALCIUM 9.8 9.4  GFRNONAA 51* >60  ANIONGAP 10 8    No results for input(s): "PROT", "ALBUMIN", "AST", "ALT", "ALKPHOS", "BILITOT" in the last 168 hours. Lipids  Recent Labs  Lab 09/04/22 0300  CHOL 147  TRIG 177*  HDL 32*  LDLCALC 80  CHOLHDL 4.6   Hematology Recent Labs  Lab 09/03/22 1852 09/04/22 0300  WBC 10.2 14.1*  RBC 5.29 4.64  HGB 16.8 14.8  HCT 49.0 43.2  MCV 92.6 93.1  MCH 31.8 31.9  MCHC 34.3 34.3  RDW 11.9 12.0  PLT 243 226   Thyroid No results for input(s): "TSH", "FREET4" in the last 168 hours. BNPNo results for input(s): "BNP", "PROBNP" in the last 168 hours.  DDimer No results  for input(s): "DDIMER" in the last 168 hours.   Radiology/Studies:  CARDIAC CATHETERIZATION  Result Date: 09/04/2022 Conclusions: Severe three-vessel coronary artery disease, as detailed below.  Culprit lesion for the patient's NSTEMI is most likely occluded distal segment of OM1.  There are also sequential 90% stenoses of the mid/distal LAD, 70% mid LCx lesion, and 80-90% ostial rPDA and proximal rPLA stenoses. Moderately to severely reduced left ventricular systolic function (LVEF 30-35%) with apical dyskinesis. Upper normal left ventricular filling pressure (LVEDP 15 mmHg). Recommendations: Transfer to [facility] for cardiac surgery consultation for CABG. Restart heparin infusion 2 hours after TR band removal.  Defer addition of P2Y12 inhibitor pending cardiac surgery consultation. Aggressive secondary prevention of coronary artery disease. Maintain net even fluid balance and escalate goal-directed medical therapy for HFrEF as tolerated. Follow-up echocardiogram. Yvonne Kendall, MD [facility]  DG Chest 2 View  Result Date: 09/03/2022 CLINICAL DATA:  Chest pain, shortness of breath EXAM: CHEST - 2 VIEW COMPARISON:  None Available. FINDINGS: Lungs are clear.  No pleural effusion or pneumothorax. The heart is normal in size. Visualized osseous structures are within normal limits. IMPRESSION: Normal chest radiographs. Electronically Signed   By: Charline Bills M.D.   On: 09/03/2022 19:16     Assessment and Plan:   NSTEMI -Presented with complaints of chest discomfort that relieved with nitroglycerin -Currently remains chest pain-free -High-sensitivity troponins trended, 11 and 599 -Remained n.p.o. -Continued aspirin, statin -Restart heparin infusion 2 hours after TR band removal -LHC revealed three-vessel coronary artery disease, patient transferred to [facility] for evaluation for CABG -Echocardiogram is scheduled with further recommendations  to follow -EKG as needed for pain  or changes -Continued on metoprolol 25 mg twice daily -Continued on telemetry monitoring   HFrEF/ICM -noted on LHC -echocardiogram ordered and pending -escalate GDMT as tolerated by blood pressure and kidney function -daily weight, I's&O's, low sodium diet  CKD stage IIIa -Serum creatinine is 1.12 -Continued on IV fluid for gentle hydration -Daily BMP -Monitor urine output -Monitor/trend/replete electrolytes as needed -Avoid nephrotoxic agents were able   Risk Assessment/Risk Scores:    TIMI Risk Score for Unstable Angina or Non-ST Elevation MI:   The patient's TIMI risk score is  , which indicates a  % risk of all cause mortality, new or recurrent myocardial infarction or need for urgent revascularization in the next 14 days.  New York Heart Association (NYHA) Functional Class NYHA Class II    Code Status: Full Code  Severity of Illness: The appropriate patient status for this patient is INPATIENT. Inpatient status is judged to be reasonable and necessary in order to provide the required intensity of service to ensure the patient's safety. The patient's presenting symptoms, physical exam findings, and initial radiographic and laboratory data in the context of their chronic comorbidities is felt to place them at high risk for further clinical deterioration. Furthermore, it is not anticipated that the patient will be medically stable for discharge from the hospital within 2 midnights of admission.   * I certify that at the point of admission it is my clinical judgment that the patient will require inpatient hospital care spanning beyond 2 midnights from the point of admission due to high intensity of service, high risk for further deterioration and high frequency of surveillance required.*   For questions or updates, please contact [facility] Please consult [email or web address] for contact info under     Signed, Quinlyn Tep, NP  09/04/2022 3:46 PM

## 2022-09-04 NOTE — Consult Note (Signed)
ANTICOAGULATION CONSULT NOTE - Initial Consult  Pharmacy Consult for heparin infusion Indication: chest pain/ACS  Allergies  Allergen Reactions   Pravastatin     jittery   Amoxicillin Rash    ++ got ceftriaxone in the past++ Has patient had a PCN reaction causing immediate rash, facial/tongue/throat swelling, SOB or lightheadedness with hypotension: Yes Has patient had a PCN reaction causing severe rash involving mucus membranes or skin necrosis: No Has patient had a PCN reaction that required hospitalization No Has patient had a PCN reaction occurring within the last 10 years: No If all of the above answers are "NO", then may proceed with Cephalosporin use.      Patient Measurements: Height: 5\' 10"  (177.8 cm) Weight: 85.5 kg (188 lb 7.9 oz) IBW/kg (Calculated) : 73 Heparin Dosing Weight: 85.5 kg  Vital Signs: Temp: 98.3 F (36.8 C) (07/03 0255) Temp Source: Oral (07/03 0255) BP: 122/73 (07/03 0500) Pulse Rate: 58 (07/03 0500)  Labs: Recent Labs    09/03/22 1852 09/03/22 2045 09/03/22 2129 09/04/22 0300  HGB 16.8  --   --  14.8  HCT 49.0  --   --  43.2  PLT 243  --   --  226  APTT  --   --  34  --   LABPROT  --   --  14.2  --   INR  --   --  1.1  --   HEPARINUNFRC  --   --   --  0.47  CREATININE 1.41*  --   --  1.12  TROPONINIHS 11 599*  --   --      Estimated Creatinine Clearance: 57 mL/min (by C-G formula based on SCr of 1.12 mg/dL).   Medical History: Past Medical History:  Diagnosis Date   Abdominal mass    Abdominal pain 06/02/2017   Anxiety    Pt. denies   Arthritis    pt. 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 male with PMH of HTN, CKD presented to [facility] with chest pain and SOB.Troponin I trend 11 > 599. Pharmacy has been consulted to initiate and manage IV heparin therapy.   Baseline CBC wnl, aPTT/INR ordered   Goal of Therapy:  Heparin level 0.3-0.7 units/ml Monitor platelets by anticoagulation protocol: Yes   Plan:  7/3:  HL @ 0300 = 0.47, therapeutic X 1 - Will continue pt on current rate and recheck HL in 8 hrs on 7/3 @ 1100.  Scherrie Gerlach, PharmD Clinical Pharmacist   09/04/2022,6:22 AM

## 2022-09-04 NOTE — Consult Note (Signed)
ANTICOAGULATION CONSULT NOTE   Pharmacy Consult for heparin infusion Indication: chest pain/ACS  Allergies  Allergen Reactions   Pravastatin     jittery   Amoxicillin Rash    ++ got ceftriaxone in the past++ Has patient had a PCN reaction causing immediate rash, facial/tongue/throat swelling, SOB or lightheadedness with hypotension: Yes Has patient had a PCN reaction causing severe rash involving mucus membranes or skin necrosis: No Has patient had a PCN reaction that required hospitalization No Has patient had a PCN reaction occurring within the last 10 years: No If all of the above answers are "NO", then may proceed with Cephalosporin use.      Patient Measurements: Height: 5\' 10"  (177.8 cm) Weight: 85.5 kg (188 lb 7.9 oz) IBW/kg (Calculated) : 73 Heparin Dosing Weight: 85.5 kg  Vital Signs: Temp: 98.2 F (36.8 C) (07/03 1054) Temp Source: Oral (07/03 1054) BP: 127/67 (07/03 1054) Pulse Rate: 63 (07/03 1054)  Labs: Recent Labs    09/03/22 1852 09/03/22 2045 09/03/22 2129 09/04/22 0300 09/04/22 0724  HGB 16.8  --   --  14.8  --   HCT 49.0  --   --  43.2  --   PLT 243  --   --  226  --   APTT  --   --  34  --   --   LABPROT  --   --  14.2  --   --   INR  --   --  1.1  --   --   HEPARINUNFRC  --   --   --  0.47 0.44  CREATININE 1.41*  --   --  1.12  --   TROPONINIHS 11 599*  --   --   --      Estimated Creatinine Clearance: 57 mL/min (by C-G formula based on SCr of 1.12 mg/dL).   Medical History: Past Medical History:  Diagnosis Date   Abdominal mass    Abdominal pain 06/02/2017   Anxiety    Pt. denies   Arthritis    pt. 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   left above alar crease sidewall of nose/in situ    Medications:  No prior anticoagulation noted   Assessment: 77 y.o. male with PMH of HTN, CKD presented to [facility] with chest pain and SOB.Troponin I trend 11 > 599. Pharmacy has been consulted to initiate and manage IV heparin therapy.   7/3 0300 HL 0.47  7/3 0724 HL 0.44   Goal of Therapy:  Heparin level 0.3-0.7 units/ml Monitor platelets by anticoagulation protocol: Yes   Plan:  Heparin level is therapeutic. Will continue heparin infusion at 1200 units/hr. Recheck heparin level with AM labs. CBC daily while on heparin.   Ronnald Ramp, PharmD Clinical Pharmacist   09/04/2022,11:04 AM

## 2022-09-04 NOTE — Interval H&P Note (Signed)
History and Physical Interval Note:  09/04/2022 12:04 PM  Rodney Hall.  has presented today for surgery, with the diagnosis of non st elevation myocardial infarction.  The various methods of treatment have been discussed with the patient and family. After consideration of risks, benefits and other options for treatment, the patient has consented to  Procedure(s): LEFT HEART CATH AND CORONARY ANGIOGRAPHY (N/A) as a surgical intervention.  The patient's history has been reviewed, patient examined, no change in status, stable for surgery.  I have reviewed the patient's chart and labs.  Questions were answered to the patient's satisfaction.    Cath Lab Visit (complete for each Cath Lab visit)  Clinical Evaluation Leading to the Procedure:   ACS: Yes.    Non-ACS:  N/A  Nella Botsford

## 2022-09-04 NOTE — Consult Note (Addendum)
[address]       [place] [address]             [phone]        [name] Medical Record #[id number] Date of Birth: [birth year]  Referring: Antonieta Iba, MD Primary Care: Malva Limes, MD Primary Cardiologist:None  Reason for consult: Evaluation for surgical revascularization for three-vessel coronary artery disease after presenting with non-ST elevation myocardial infarction.   History of Present Illness:     Rodney Hall is a 77 year old gentleman with a past history notable for stage III chronic kidney disease, benign prostatic hyperplasia, and dyslipidemia.  He developed chest pain while at home last evening while preparing his grill.  He describes it as a deep pressure rated at 9/10.  The pain had improved by the time he arrived to the [facility] ED.  Initial EKG showed sinus rhythm with a left fascicular block and suspected Q waves in the inferior leads.  Chest x-ray showed no acute cardiopulmonary disease.  Initial high-sensitivity troponin was 11 but when repeated later was 599.  After being receiving sublingual nitroglycerin and being started on a nitroglycerin  infusion, he has had no further chest pain.  He was admitted with diagnosis of acute non-ST elevation myocardial infarction.  He was treated with standard protocols and underwent left heart catheterization by Dr. Okey Dupre.  This demonstrated severe three-vessel coronary artery disease with 70% and 90% tandem mid to distal LAD stenoses, 70% mid circumflex stenosis, and 80 to 90% posterior descending coronary stenosis.  His ejection fraction by left ventriculogram was estimated at 30 to 35% with apical dyskinesis.  An echocardiogram has been ordered but not yet performed. We have been asked to evaluate Rodney Hall for consideration of coronary bypass grafting for severe three-vessel coronary artery disease after presenting with acute non-ST elevation  myocardial infarction and depressed LV function.  Rodney Hall is accompanied by his wife and 2 sons during this interview.  He reports he has not had any further chest pain since arrival to the emergency room.  He lives a fairly active lifestyle and pays attention to his health.  He rides his bicycle for an hour 6 days a week and had not experienced any cardiac symptoms until the event last night.  He has been careful with his diet over the last year and has been able to shift his kidney disease from stage IIIb back to stage IIIa.  He sees a Education officer, community every 6 months and has no dental issues currently.  He has never used tobacco products.   Current Activity/ Functional Status: Patient is independent with mobility/ambulation, transfers, ADL's, IADL's.   Zubrod Score: At the time of surgery this patient's most appropriate activity status/level should be described as: []     0    Normal activity, no symptoms [x]     1    Restricted in physical strenuous activity but ambulatory, able to do out light work []     2    Ambulatory and capable of self care, unable to do work activities, up and about                 more than 50%  Of the time                            []     3    Only limited  self care, in bed greater than 50% of waking hours []     4    Completely disabled, no self care, confined to bed or chair []     5    Moribund  Past Medical History:  Diagnosis Date   Abdominal mass    Abdominal pain 06/02/2017   Anxiety    Pt. denies   Arthritis    pt. 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    Past Surgical History:  Procedure Laterality Date    ANTERIOR LAT LUMBAR FUSION Right 07/30/2016   Procedure: Right Lateral two-three Lateral transpsoas interbody fusion with lateral plating/Minimally invasive decompression at Lumbar two-three;  Surgeon: Ditty, Loura Halt, MD;  Location: [facility] OR;  Service: Neurosurgery;  Laterality: Right;   BACK SURGERY  2012   CATARACT EXTRACTION, BILATERAL     CHOLECYSTECTOMY N/A 07/25/2017   Procedure: LAPAROSCOPIC CHOLECYSTECTOMY WITH INTRAOPERATIVE CHOLANGIOGRAM;  Surgeon: Luretha Murphy, MD;  Location: [facility] ORS;  Service: General;  Laterality: N/A;   COLONOSCOPY WITH PROPOFOL N/A 12/12/2017   Procedure: COLONOSCOPY WITH PROPOFOL;  Surgeon: Scot Jun, MD;  Location: [facility] ENDOSCOPY;  Service: Endoscopy;  Laterality: N/A;   EUS N/A 07/10/2017   Procedure: UPPER ENDOSCOPIC ULTRASOUND (EUS) LINEAR;  Surgeon: Rachael Fee, MD;  Location: [facility] ENDOSCOPY;  Service: Endoscopy;  Laterality: N/A;   EYE SURGERY     HEMI-MICRODISCECTOMY LUMBAR LAMINECTOMY LEVEL 1  09/2018   [facility]   NECK SURGERY  2000   TENNIS ELBOW RELEASE/NIRSCHEL PROCEDURE     TONSILLECTOMY AND ADENOIDECTOMY      Social History   Tobacco Use  Smoking Status Never  Smokeless Tobacco Never    Social History   Substance and Sexual Activity  Alcohol Use No   Alcohol/week: 0.0 standard drinks of alcohol     Allergies  Allergen Reactions   Pravastatin     jittery   Amoxicillin Rash    ++ got ceftriaxone in the past++ Has patient had a PCN reaction causing immediate rash, facial/tongue/throat swelling, SOB or lightheadedness with hypotension: Yes Has patient had a PCN reaction causing severe rash involving mucus membranes or skin necrosis: No Has patient had a PCN reaction that required hospitalization No Has patient had a PCN reaction occurring within the last 10 years: No If all of the above answers are "NO", then may proceed with Cephalosporin use.      No current facility-administered medications for this  encounter.    Medications Prior to Admission  Medication Sig Dispense Refill Last Dose   acetaminophen (TYLENOL) 325 MG tablet Take 650 mg by mouth every 6 (six) hours as needed for moderate pain or headache.      [START ON 09/05/2022] aspirin EC 81 MG tablet Take 1 tablet (81 mg total) by mouth daily. Swallow whole. 30 tablet 12    atorvastatin (LIPITOR) 80 MG tablet Take 1 tablet (80 mg total) by mouth daily.      imipramine (TOFRANIL) 50 MG tablet Take 50 mg by mouth at bedtime.      metoprolol tartrate (LOPRESSOR) 25 MG tablet Take 1  tablet (25 mg total) by mouth 2 (two) times daily.      tamsulosin (FLOMAX) 0.4 MG CAPS capsule Take 0.4 mg by mouth daily after supper.        Family History  Problem Relation Age of Onset   Cancer Mother        brain   Diabetes Father    Rheumatic fever Sister    Heart disease Sister    Diabetes Brother      Review of Systems:       Cardiac Review of Systems: Y or  [    ]= no  Chest Pain [  x, now resolved]  Resting SOB [   ] Exertional SOB  [  ]  Orthopnea [  ]   Pedal Edema [   ]    Palpitations [  ] Syncope  [  ]   Presyncope [   ]  General Review of Systems: [Y] = yes [  ]=no Constitional: recent weight change [  ]; anorexia [  ]; fatigue [  ]; nausea [  ]; night sweats [  ]; fever [  ]; or chills [  ]                                                               Dental: Last Dentist visit: Within the past month  Eye : blurred vision [  ]; diplopia [   ]; vision changes [  ];  Amaurosis fugax[  ]; Resp: cough [  ];  wheezing[  ];  hemoptysis[  ]; shortness of breath[  ]; paroxysmal nocturnal dyspnea[  ]; dyspnea on exertion[  ]; or orthopnea[  ];  GI:  gallstones[  ], vomiting[  ];  dysphagia[  ]; melena[  ];  hematochezia [  ]; heartburn[  ];   Hx of  Colonoscopy[  ]; GU: kidney stones [  ]; hematuria[  ];   dysuria [  ];  nocturia[  ];  history of     obstruction [  ]; urinary frequency [  ]             Skin: rash, swelling[  ];, hair  loss[  ];  peripheral edema[  ];  or itching[  ]; Musculosketetal: myalgias[  ];  joint swelling[  ];  joint erythema[  ];  joint pain[  ];  back pain[  ];  Heme/Lymph: bruising[  ];  bleeding[  ];  anemia[  ];  Neuro: TIA[  ];  headaches[  ];  stroke[  ];  vertigo[  ];  seizures[  ];   paresthesias[  ];  difficulty walking[  ];  Psych:depression[  ]; anxiety[  ];  Endocrine: diabetes[  ];  thyroid dysfunction[  ];                 Physical Exam: BP (!) 143/81 (BP Location: Left Arm)   Pulse 69   Temp 98.1 F (36.7 C) (Oral)   Resp 16   SpO2 97%    General appearance: alert, cooperative, and no distress Head: Normocephalic, without obvious abnormality, atraumatic Neck: no adenopathy, no carotid bruit, no JVD, and supple, symmetrical, trachea midline Lymph nodes: No cervical or clavicular adenopathy Resp: Breath sounds full, equal, and clear to auscultation  Cardio: Regular rate and rhythm, no murmur GI: Soft, nontender Extremities: All well-perfused with palpable distal pulses in all extremities.  No peripheral edema.  No significant lower extremity varicosities. Neurologic: Grossly normal  Diagnostic Studies & Laboratory data:  LEFT HEART CATH AND CORONARY ANGIOGRAPHY   Conclusion  Conclusions: Severe three-vessel coronary artery disease, as detailed below.  Culprit lesion for the patient's NSTEMI is most likely occluded distal segment of OM1.  There are also sequential 90% stenoses of the mid/distal LAD, 70% mid LCx lesion, and 80-90% ostial rPDA and proximal rPLA stenoses. Moderately to severely reduced left ventricular systolic function (LVEF 30-35%) with apical dyskinesis. Upper normal left ventricular filling pressure (LVEDP 15 mmHg).   Recommendations: Transfer to [facility] for cardiac surgery consultation for CABG. Restart heparin infusion 2 hours after TR band removal.  Defer addition of P2Y12 inhibitor pending cardiac surgery consultation. Aggressive secondary  prevention of coronary artery disease. Maintain net even fluid balance and escalate goal-directed medical therapy for HFrEF as tolerated. Follow-up echocardiogram.   Yvonne Kendall, MD [facility]     Recommendations  Antiplatelet/Anticoag Continue aspirin 81 mg daily; restart heparin infusion 2 hours after TR band removal.  Defer addition of P2Y12 inhibitor pending cardiac surgery consultation.   Indications  Non-ST elevation (NSTEMI) myocardial infarction (HCC) [I21.4 (ICD-10-CM)]   Clinical Presentation  CHF/Shock Congestive heart failure not present. No shock present.   Procedural Details  Technical Details Indication: 77 y.o. year-old man with history of CKD stage 3, BPH, and basal cell carcinoma, admitted with NSTEMI.  GFR: >60 ml/min  Procedure: The risks, benefits, complications, treatment options, and expected outcomes were discussed with the patient. The patient and/or family concurred with the proposed plan, giving informed consent. The right wrist was prepped and draped in a sterile fashion. 1% lidocaine was used for local anesthesia. Using the modified Seldinger access technique, a 1F slender Glidesheath was placed in the right radial artery. 3 mg Verapamil was given through the sheath. Heparin 4,500 units were administered.  Selective coronary angiography was performed using a 44F TIG4.0 catheter to engage the left coronary artery and a 44F TIG4.0 catheter to engage the right coronary artery. Left heart catheterization was performed using a 44F angled pigtail catheter. Left ventriculogram was performed with a power injection of contrast.  At the end of the procedure, the radial artery sheath was removed and a TR band applied to achieve patent hemostasis. There were no immediate complications. The patient was taken to the recovery area in stable condition.   Estimated blood loss <50 mL.   During this procedure medications were administered to achieve and maintain  moderate conscious sedation while the patient's heart rate, blood pressure, and oxygen saturation were continuously monitored and I was present face-to-face 100% of this time.   Medications (Filter: Administrations occurring from 1150 to 1247 on 09/04/22)  important  Continuous medications are totaled by the amount administered until 09/04/22 1247.   midazolam (VERSED) injection (mg)  Total dose: 1 mg Date/Time Rate/Dose/Volume Action   09/04/22 1207 1 mg Given   fentaNYL (SUBLIMAZE) injection (mcg)  Total dose: 25 mcg Date/Time Rate/Dose/Volume Action   09/04/22 1207 25 mcg Given   lidocaine (PF) (XYLOCAINE) 1 % injection (mL)  Total volume: 2 mL Date/Time Rate/Dose/Volume Action   09/04/22 1213 2 mL Given   verapamil (ISOPTIN) injection (mg)  Total dose: 5 mg Date/Time Rate/Dose/Volume Action   09/04/22 1214 2.5 mg Given   1230 2.5 mg Given   heparin sodium (  porcine) injection (Units)  Total dose: 4,500 Units Date/Time Rate/Dose/Volume Action   09/04/22 1215 4,500 Units Given   iohexol (OMNIPAQUE) 300 MG/ML solution (mL)  Total volume: 60 mL Date/Time Rate/Dose/Volume Action   09/04/22 1239 60 mL Given    Sedation Time  Sedation Time Physician-1: 22 minutes 35 seconds Contrast     Administrations occurring from 1150 to 1247 on 09/04/22:  Medication Name Total Dose  iohexol (OMNIPAQUE) 300 MG/ML solution 60 mL   Radiation/Fluoro  Fluoro time: 4.3 (min) DAP: 25.6 (Gycm2) Cumulative Air Kerma: 361 (mGy) Coronary Findings  Diagnostic Dominance: Right Left Main  Vessel is large.  Mid LM to Ost LAD lesion is 30% stenosed with 30% stenosed side branch in Ost Cx.    Left Anterior Descending  Vessel is large.  Mid LAD-1 lesion is 90% stenosed.  Mid LAD-2 lesion is 90% stenosed.  Dist LAD lesion is 90% stenosed.    First Diagonal Branch  Vessel is small in size.    Second Diagonal Branch  Vessel is moderate in size.  2nd Diag lesion is 70% stenosed.     Third Diagonal Branch  Vessel is small in size.    Left Circumflex  Vessel is large.  Mid Cx to Dist Cx lesion is 70% stenosed.    First Obtuse Marginal Branch  Vessel is small in size.    Second Obtuse Marginal Branch  Vessel is large in size. There is mild disease in the vessel.  2nd Mrg lesion is 100% stenosed. Vessel is the culprit lesion. The lesion is thrombotic.    Third Obtuse Marginal Branch  Vessel is moderate in size.    Fourth Obtuse Marginal Branch  Vessel is small in size.    Right Coronary Artery  Vessel is large. There is mild diffuse disease throughout the vessel.    Right Posterior Descending Artery  Vessel is moderate in size.  RPDA lesion is 80% stenosed.    Right Posterior Atrioventricular Artery  RPAV lesion is 90% stenosed.    First Right Posterolateral Branch  Vessel is small in size.    Second Right Posterolateral Branch  Vessel is moderate in size.    Third Right Posterolateral Branch  Vessel is moderate in size.    Intervention   No interventions have been documented.   Wall Motion              Left Heart  Left Ventricle There is moderate to severe left ventricular systolic dysfunction. LVEF 30-35%. LV end diastolic pressure is normal. LVEDP 15 mmHg. There are LV function abnormalities due to segmental dysfunction.  Aortic Valve There is no aortic valve stenosis.   Coronary Diagrams  Diagnostic Dominance: Right       Recent Radiology Findings:  DG Chest 2 View  Result Date: 09/03/2022 CLINICAL DATA:  Chest pain, shortness of breath EXAM: CHEST - 2 VIEW COMPARISON:  None Available. FINDINGS: Lungs are clear.  No pleural effusion or pneumothorax. The heart is normal in size. Visualized osseous structures are within normal limits. IMPRESSION: Normal chest radiographs. Electronically Signed   By: Charline Bills M.D.   On: 09/03/2022 19:16     I have independently reviewed the above radiologic studies and discussed with  the patient   Recent Lab Findings: Lab Results  Component Value Date   WBC 14.1 (H) 09/04/2022   HGB 14.8 09/04/2022   HCT 43.2 09/04/2022   PLT 226 09/04/2022   GLUCOSE 129 (H) 09/04/2022   CHOL 147 09/04/2022  TRIG 177 (H) 09/04/2022   HDL 32 (L) 09/04/2022   LDLCALC 80 09/04/2022   ALT 13 10/10/2021   AST 17 10/10/2021   NA 135 09/04/2022   K 4.2 09/04/2022   CL 106 09/04/2022   CREATININE 1.12 09/04/2022   BUN 15 09/04/2022   CO2 21 (L) 09/04/2022   TSH 2.960 05/29/2020   INR 1.1 09/03/2022   HGBA1C 6.3 (H) 02/22/2022      Assessment / Plan:      -Three-vessel coronary artery disease presenting with non-STEMI and left ventricular ejection fraction of 30 to 35% with apical dyskinesia.  He is currently pain-free and stable.  The heparin infusion is to resume at 6 PM today.   We agree that coronary bypass grafting is his best option for revascularization.  The procedure and expected postoperative course were discussed with [name] and his family and they are questions were answered.  Rodney Hall would like for [place] to proceed with preoperative workup and schedule surgery as soon as it can be arranged.  -CKD stage III-creatinine on admission was 1.41  -Benign prostatic hyperplasia: On Flomax    I  spent 25 minutes counseling the patient face to face.   Leary Roca, PA-C  09/04/2022 4:11 PM  Patient seen and examined, records and cath images reviewed. Agree with H and P as outlined above. 76 yo man with no prior cardiac history presents with severe Cp and r/I for non-STEMI. Catheterization shows severe 3 vessel CAD.  CABG indicated for survival benefit and relief of symptoms.    I discussed the general nature of the procedure, including the need for general anesthesia, the incisions to be used, the use of cardiopulmonary bypass, and the use of drainage tubes and temporary pacemaker wires postoperatively.  We discussed the expected hospital stay, overall  recovery and short and long term outcomes.  I informed Rodney Hall and his family of the indications, risks, benefits and alternatives. He understands the risks include, but are not limited to death, stroke, MI, DVT/PE, bleeding, possible need for transfusion, infections, cardiac arrhythmias, as well as other organ system dysfunction including respiratory, renal, or GI complications.  He accepts the risks and agrees to proceed.   Unknown when we will be able to get him on schedule.  Currently on schedule for Friday 09/13/2022.  Will try to move up if possible.  Salvatore Decent Dorris Fetch, MD [facility] [phone]

## 2022-09-05 ENCOUNTER — Other Ambulatory Visit: Payer: Self-pay

## 2022-09-05 DIAGNOSIS — I214 Non-ST elevation (NSTEMI) myocardial infarction: Principal | ICD-10-CM

## 2022-09-05 LAB — CBC WITH DIFFERENTIAL/PLATELET
Abs Immature Granulocytes: 0.04 10*3/uL (ref 0.00–0.07)
Basophils Absolute: 0.1 10*3/uL (ref 0.0–0.1)
Basophils Relative: 0 %
Eosinophils Absolute: 0.1 10*3/uL (ref 0.0–0.5)
Eosinophils Relative: 1 %
HCT: 43.4 % (ref 39.0–52.0)
Hemoglobin: 14.8 g/dL (ref 13.0–17.0)
Immature Granulocytes: 0 %
Lymphocytes Relative: 18 %
Lymphs Abs: 2.3 10*3/uL (ref 0.7–4.0)
MCH: 31.3 pg (ref 26.0–34.0)
MCHC: 34.1 g/dL (ref 30.0–36.0)
MCV: 91.8 fL (ref 80.0–100.0)
Monocytes Absolute: 1.3 10*3/uL — ABNORMAL HIGH (ref 0.1–1.0)
Monocytes Relative: 10 %
Neutro Abs: 9.4 10*3/uL — ABNORMAL HIGH (ref 1.7–7.7)
Neutrophils Relative %: 71 %
Platelets: 216 10*3/uL (ref 150–400)
RBC: 4.73 MIL/uL (ref 4.22–5.81)
RDW: 11.9 % (ref 11.5–15.5)
WBC: 13.3 10*3/uL — ABNORMAL HIGH (ref 4.0–10.5)
nRBC: 0 % (ref 0.0–0.2)

## 2022-09-05 LAB — HEPARIN LEVEL (UNFRACTIONATED)
Heparin Unfractionated: 0.29 [IU]/mL — ABNORMAL LOW (ref 0.30–0.70)
Heparin Unfractionated: 0.31 [IU]/mL (ref 0.30–0.70)

## 2022-09-05 LAB — BASIC METABOLIC PANEL WITH GFR
Anion gap: 9 (ref 5–15)
BUN: 13 mg/dL (ref 8–23)
CO2: 22 mmol/L (ref 22–32)
Calcium: 8.8 mg/dL — ABNORMAL LOW (ref 8.9–10.3)
Chloride: 103 mmol/L (ref 98–111)
Creatinine, Ser: 1.21 mg/dL (ref 0.61–1.24)
GFR, Estimated: >60 mL/min
Glucose, Bld: 135 mg/dL — ABNORMAL HIGH (ref 70–99)
Potassium: 3.9 mmol/L (ref 3.5–5.1)
Sodium: 134 mmol/L — ABNORMAL LOW (ref 135–145)

## 2022-09-05 MED ORDER — MORPHINE SULFATE (PF) 2 MG/ML IV SOLN
2.0000 mg | Freq: Once | INTRAVENOUS | Status: AC
  Administered 2022-09-05: 2 mg via INTRAVENOUS
  Filled 2022-09-05: qty 1

## 2022-09-05 MED ORDER — NITROGLYCERIN IN D5W 200-5 MCG/ML-% IV SOLN
5.0000 ug/min | INTRAVENOUS | Status: DC
  Administered 2022-09-05: 5 ug/min via INTRAVENOUS
  Administered 2022-09-06: 45 ug/min via INTRAVENOUS
  Filled 2022-09-05: qty 250

## 2022-09-05 NOTE — Progress Notes (Addendum)
Rounding Note    Patient Name: Rodney Hall. Date of Encounter: 09/05/2022  [facility] Cardiologist: Dr. Dossie Arbour  Subjective   Patient admitted with non-STEMI.  On IV heparin.  Pain-free.  Inpatient Medications    Scheduled Meds:  aspirin EC  81 mg Oral Daily   atorvastatin  80 mg Oral Daily   imipramine  50 mg Oral QHS   metoprolol tartrate  25 mg Oral BID   tamsulosin  0.4 mg Oral QPC supper   Continuous Infusions:  heparin 1,300 Units/hr (09/05/22 0448)   PRN Meds: acetaminophen, nitroGLYCERIN, ondansetron (ZOFRAN) IV   Vital Signs    Vitals:   09/04/22 2158 09/05/22 0026 09/05/22 0500 09/05/22 0740  BP: 131/75 116/73 (!) 112/95 118/69  Pulse: 88 72 66 68  Resp:  18 18 20   Temp:  98.4 F (36.9 C) 98.3 F (36.8 C) 98.4 F (36.9 C)  TempSrc:  Oral Oral Oral  SpO2:  96% 97% 96%    Intake/Output Summary (Last 24 hours) at 09/05/2022 0825 Last data filed at 09/05/2022 1610 Gross per 24 hour  Intake 392.75 ml  Output 400 ml  Net -7.25 ml      09/03/2022    6:50 PM 07/03/2022   11:13 AM 02/22/2022    9:47 AM  Last 3 Weights  Weight (lbs) 188 lb 7.9 oz 188 lb 9.6 oz 198 lb  Weight (kg) 85.5 kg 85.548 kg 89.812 kg      Telemetry    Sinus rhythm- Personally Reviewed  ECG    Normal sinus rhythm at 67 with inferior Q waves and lateral T wave inversion- Personally Reviewed  Physical Exam   GEN: No acute distress.   Neck: No JVD Cardiac: RRR, no murmurs, rubs, or gallops.  Respiratory: Clear to auscultation bilaterally. GI: Soft, nontender, non-distended  MS: No edema; No deformity. Neuro:  Nonfocal  Psych: Normal affect   Labs    High Sensitivity Troponin:   Recent Labs  Lab 09/03/22 1852 09/03/22 2045  TROPONINIHS 11 599*     Chemistry Recent Labs  Lab 09/03/22 1852 09/04/22 0300 09/05/22 0337  NA 135 135 134*  K 4.0 4.2 3.9  CL 101 106 103  CO2 24 21* 22  GLUCOSE 133* 129* 135*  BUN 18 15 13   CREATININE 1.41*  1.12 1.21  CALCIUM 9.8 9.4 8.8*  GFRNONAA 51* >60 >60  ANIONGAP 10 8 9     Lipids  Recent Labs  Lab 09/04/22 0300  CHOL 147  TRIG 177*  HDL 32*  LDLCALC 80  CHOLHDL 4.6    Hematology Recent Labs  Lab 09/03/22 1852 09/04/22 0300 09/05/22 0337  WBC 10.2 14.1* 13.3*  RBC 5.29 4.64 4.73  HGB 16.8 14.8 14.8  HCT 49.0 43.2 43.4  MCV 92.6 93.1 91.8  MCH 31.8 31.9 31.3  MCHC 34.3 34.3 34.1  RDW 11.9 12.0 11.9  PLT 243 226 216   Thyroid No results for input(s): "TSH", "FREET4" in the last 168 hours.  BNPNo results for input(s): "BNP", "PROBNP" in the last 168 hours.  DDimer No results for input(s): "DDIMER" in the last 168 hours.   Radiology    CARDIAC CATHETERIZATION  Result Date: 09/04/2022 Conclusions: Severe three-vessel coronary artery disease, as detailed below.  Culprit lesion for the patient's NSTEMI is most likely occluded distal segment of OM1.  There are also sequential 90% stenoses of the mid/distal LAD, 70% mid LCx lesion, and 80-90% ostial rPDA and proximal rPLA  stenoses. Moderately to severely reduced left ventricular systolic function (LVEF 30-35%) with apical dyskinesis. Upper normal left ventricular filling pressure (LVEDP 15 mmHg). Recommendations: Transfer to [facility] for cardiac surgery consultation for CABG. Restart heparin infusion 2 hours after TR band removal.  Defer addition of P2Y12 inhibitor pending cardiac surgery consultation. Aggressive secondary prevention of coronary artery disease. Maintain net even fluid balance and escalate goal-directed medical therapy for HFrEF as tolerated. Follow-up echocardiogram. Yvonne Kendall, MD [facility]  DG Chest 2 View  Result Date: 09/03/2022 CLINICAL DATA:  Chest pain, shortness of breath EXAM: CHEST - 2 VIEW COMPARISON:  None Available. FINDINGS: Lungs are clear.  No pleural effusion or pneumothorax. The heart is normal in size. Visualized osseous structures are within normal limits. IMPRESSION: Normal chest  radiographs. Electronically Signed   By: Charline Bills M.D.   On: 09/03/2022 19:16    Cardiac Studies   Cardiac catheterization (09/04/2022)  Conclusion  Conclusions: Severe three-vessel coronary artery disease, as detailed below.  Culprit lesion for the patient's NSTEMI is most likely occluded distal segment of OM1.  There are also sequential 90% stenoses of the mid/distal LAD, 70% mid LCx lesion, and 80-90% ostial rPDA and proximal rPLA stenoses. Moderately to severely reduced left ventricular systolic function (LVEF 30-35%) with apical dyskinesis. Upper normal left ventricular filling pressure (LVEDP 15 mmHg).   Recommendations: Transfer to [facility] for cardiac surgery consultation for CABG. Restart heparin infusion 2 hours after TR band removal.  Defer addition of P2Y12 inhibitor pending cardiac surgery consultation. Aggressive secondary prevention of coronary artery disease. Maintain net even fluid balance and escalate goal-directed medical therapy for HFrEF as tolerated. Follow-up echocardiogram.   Yvonne Kendall, MD [facility]  Diagrams  Diagnostic Dominance: Right     Patient Profile     77 y.o. male admitted in transfer from [facility] after he presented with chest pain and positive enzymes.  He underwent diagnostic coronary angiography by Dr. Okey Dupre revealing three-vessel disease and severe LV dysfunction.  He was transferred to [facility] for further evaluation and treatment.  Assessment & Plan    1: Non-STEMI-troponins rose to 600.  Cath showed three-vessel disease with severe LV dysfunction.  Currently on IV heparin and pain-free.  Seen by Dr. Dorris Fetch with[facility] who felt he was a suitable candidate for CABG although this has been scheduled for a week from tomorrow given logistical issues.  2: Ischemic cardiomyopathy-EF appears to be in the 25% range with anterolateral wall motion abnormalities.  Currently only on beta-blocker.  Will start GDMT after  surgical revascularization.  For questions or updates, please contact [facility] Please consult [email or web address] for contact info under        Signed, Nanetta Batty, MD  09/05/2022, 8:25 AM

## 2022-09-05 NOTE — Progress Notes (Signed)
ANTICOAGULATION CONSULT NOTE - Initial Consult  Pharmacy Consult for heparin Indication: chest pain/ACS  Allergies  Allergen Reactions   Pravastatin     jittery   Amoxicillin Rash    ++ got ceftriaxone in the past++ Has patient had a PCN reaction causing immediate rash, facial/tongue/throat swelling, SOB or lightheadedness with hypotension: Yes Has patient had a PCN reaction causing severe rash involving mucus membranes or skin necrosis: No Has patient had a PCN reaction that required hospitalization No Has patient had a PCN reaction occurring within the last 10 years: No If all of the above answers are "NO", then may proceed with Cephalosporin use.      Patient Measurements:   Heparin Dosing Weight: 85 kg  Vital Signs: Temp: 98.4 F (36.9 C) (07/04 0740) Temp Source: Oral (07/04 1113) BP: 90/60 (07/04 1137) Pulse Rate: 60 (07/04 1137)  Labs: Recent Labs    09/03/22 1852 09/03/22 1852 09/03/22 2045 09/03/22 2129 09/04/22 0300 09/04/22 0724 09/05/22 0337 09/05/22 1057  HGB 16.8  --   --   --  14.8  --  14.8  --   HCT 49.0  --   --   --  43.2  --  43.4  --   PLT 243  --   --   --  226  --  216  --   APTT  --   --   --  34  --   --   --   --   LABPROT  --   --   --  14.2  --   --   --   --   INR  --   --   --  1.1  --   --   --   --   HEPARINUNFRC  --    < >  --   --  0.47 0.44 0.29* 0.31  CREATININE 1.41*  --   --   --  1.12  --  1.21  --   TROPONINIHS 11  --  599*  --   --   --   --   --    < > = values in this interval not displayed.    Estimated Creatinine Clearance: 52.8 mL/min (by C-G formula based on SCr of 1.21 mg/dL).   Medical History: Past Medical History:  Diagnosis Date   Abdominal mass    Abdominal pain 06/02/2017   Anxiety    Pt. denies   Arthritis    pt. 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 No anticoagulation prior to admission. Pharmacy consulted for heparin.    Heparin level 0.31 is at low end of therapeutic on 1300 units/hr. Planning for CABG.    Goal of Therapy:  Heparin level 0.3-0.7 units/ml Monitor platelets by anticoagulation protocol: Yes   Plan:  Increase heparin 1350 units/hr to keep in goal  Monitor daily heparin level, CBC Monitor for signs/symptoms of bleeding  F/u CABG plan  Alphia Moh, PharmD, BCPS, [facility] Clinical Pharmacist  Please check AMION for all [facility] phone numbers After 10:00 PM, call [place] [phone]

## 2022-09-05 NOTE — Progress Notes (Addendum)
Dr. Allyson Sabal called me to assess patient urgently for chest pain as he had gotten a secure chat from nurse regarding recurrent chest pain. He rounded on patient this morning at which time the patient was pain free. Per earlier nurse notes, the patient had had 5/10 chest pain this afternoon and received 3 SL NTG (1110, 1124, 1203) with improvement to 2/10. Spoke with nurse who reports it had gotten down to 1/10 and subsequent BP 90/60. She had sent secure chat to Dr. Clifton James but did not get a response. He is not covering this afternoon.  Subsequent BP was 102/64. The patient reports about 3:15pm his chest pain intensified again to a 6/10, described as left sided with some radiation to his left arm, slightly different in quality and less severe than his admission chest pain. He had received 2 additional SL NTG prior to my assessment which brought pain down to a 2/10. He denies any other symptoms - no SOB, diaphoresis, nausea, vomiting. Repeat BP just now is 128/85. HR mid 60s. He is well appearing, A+Ox3, not anxious, no diaphoresis, heart RRR without M/R/G, lungs clear, extremities warm. EKG at 1110 showed NSR 65bpm with evidence of prior inferior and anterolateral infarct, worsening diffuse TWI I, II, avL, avF, V3-V6, QTc . The TW changes appear progressive from prior tracings. Repeat EKG just now again shows similar findings to 11:10am. Suspect these changes are related to ischemia. Reviewed EKGs and clinical update with Dr. Ladona Ridgel - per his recommendation we will start IV NTG and closely reassess thereafter, continue current regimen otherwise, and add f/u troponin to AM labs. Nurse advised to provide update in 30 min on patient's response to IV NTG.   Addendum: patient's chest pain resolved with IV NTG, at 60mcg/min. BP 109/79, similar to previous values. Updated Dr. Ladona Ridgel - keep in 6e for now, continue current regimen. Patient and nurse will notify for any recurrence of pain. We'll also plan to sign out  this update to the overnight cardiology fellow when shifts change. Will also order f/u EKG for AM.

## 2022-09-05 NOTE — Progress Notes (Signed)
ANTICOAGULATION CONSULT NOTE - Follow Up Consult  Pharmacy Consult for heparin Indication:  NSTEMI/3vCAD awaiting CABG  Labs: Recent Labs    09/03/22 1852 09/03/22 2045 09/03/22 2129 09/04/22 0300 09/04/22 0724 09/05/22 0337  HGB 16.8  --   --  14.8  --  14.8  HCT 49.0  --   --  43.2  --  43.4  PLT 243  --   --  226  --  216  APTT  --   --  34  --   --   --   LABPROT  --   --  14.2  --   --   --   INR  --   --  1.1  --   --   --   HEPARINUNFRC  --   --   --  0.47 0.44 0.29*  CREATININE 1.41*  --   --  1.12  --  1.21  TROPONINIHS 11 599*  --   --   --   --     Assessment: 77yo male subtherapeutic on heparin after resumed post-cath; no infusion issues or signs of bleeding per RN.  Goal of Therapy:  Heparin level 0.3-0.7 units/ml   Plan:  Increase heparin infusion slightly to 1300 units/hr. Check level in 6 hours.   Vernard Gambles, PharmD, BCPS 09/05/2022 4:33 AM

## 2022-09-05 NOTE — Progress Notes (Addendum)
Pt c/o 5/10 pain, describes it as a dull ache that stems from his mid chest and radiates to his left shoulder. EKG obtained, 0.4 mg nitroglycerin given, VSS. Verne Carrow, MD notified.  Patient reports some relief and rates pain as a 2/10. Plan of care ongoing.  Patient just received another dose of nitroglycerin.

## 2022-09-05 NOTE — Plan of Care (Signed)

## 2022-09-06 ENCOUNTER — Encounter: Payer: Self-pay | Admitting: Internal Medicine

## 2022-09-06 ENCOUNTER — Other Ambulatory Visit (HOSPITAL_COMMUNITY): Payer: HMO

## 2022-09-06 ENCOUNTER — Inpatient Hospital Stay (HOSPITAL_COMMUNITY): Payer: HMO

## 2022-09-06 ENCOUNTER — Encounter (HOSPITAL_COMMUNITY)
Disposition: A | Discharge status: Home / Self Care | Payer: Self-pay | Source: Other Acute Inpatient Hospital | Attending: Cardiovascular Disease

## 2022-09-06 DIAGNOSIS — I25110 Atherosclerotic heart disease of native coronary artery with unstable angina pectoris: Secondary | ICD-10-CM

## 2022-09-06 DIAGNOSIS — N1831 Chronic kidney disease, stage 3a: Secondary | ICD-10-CM

## 2022-09-06 DIAGNOSIS — I2510 Atherosclerotic heart disease of native coronary artery without angina pectoris: Secondary | ICD-10-CM

## 2022-09-06 DIAGNOSIS — Z01810 Encounter for preprocedural cardiovascular examination: Secondary | ICD-10-CM

## 2022-09-06 HISTORY — PX: IABP INSERTION: CATH118242

## 2022-09-06 HISTORY — PX: LEFT HEART CATH AND CORONARY ANGIOGRAPHY: CATH118249

## 2022-09-06 LAB — TROPONIN I (HIGH SENSITIVITY): Troponin I (High Sensitivity): 3272 ng/L

## 2022-09-06 LAB — BASIC METABOLIC PANEL WITH GFR
Anion gap: 13 (ref 5–15)
Anion gap: 10 (ref 5–15)
BUN: 15 mg/dL (ref 8–23)
BUN: 15 mg/dL (ref 8–23)
CO2: 19 mmol/L — ABNORMAL LOW (ref 22–32)
CO2: 22 mmol/L (ref 22–32)
Calcium: 8.7 mg/dL — ABNORMAL LOW (ref 8.9–10.3)
Calcium: 8.6 mg/dL — ABNORMAL LOW (ref 8.9–10.3)
Chloride: 103 mmol/L (ref 98–111)
Chloride: 104 mmol/L (ref 98–111)
Creatinine, Ser: 1.49 mg/dL — ABNORMAL HIGH (ref 0.61–1.24)
Creatinine, Ser: 1.45 mg/dL — ABNORMAL HIGH (ref 0.61–1.24)
GFR, Estimated: 48 mL/min — ABNORMAL LOW
GFR, Estimated: 50 mL/min — ABNORMAL LOW
Glucose, Bld: 128 mg/dL — ABNORMAL HIGH (ref 70–99)
Glucose, Bld: 118 mg/dL — ABNORMAL HIGH (ref 70–99)
Potassium: 3.9 mmol/L (ref 3.5–5.1)
Potassium: 3.8 mmol/L (ref 3.5–5.1)
Sodium: 135 mmol/L (ref 135–145)
Sodium: 136 mmol/L (ref 135–145)

## 2022-09-06 LAB — SARS CORONAVIRUS 2 BY RT PCR: SARS Coronavirus 2 by RT PCR: NEGATIVE

## 2022-09-06 LAB — TYPE AND SCREEN
ABO/RH(D): B NEG
Antibody Screen: NEGATIVE

## 2022-09-06 LAB — HEMOGLOBIN A1C
Hgb A1c MFr Bld: 5.7 % — ABNORMAL HIGH (ref 4.8–5.6)
Mean Plasma Glucose: 116.89 mg/dL

## 2022-09-06 LAB — CBC
HCT: 42.2 % (ref 39.0–52.0)
Hemoglobin: 14.3 g/dL (ref 13.0–17.0)
MCH: 32.2 pg (ref 26.0–34.0)
MCHC: 33.9 g/dL (ref 30.0–36.0)
MCV: 95 fL (ref 80.0–100.0)
Platelets: 198 10*3/uL (ref 150–400)
RBC: 4.44 MIL/uL (ref 4.22–5.81)
RDW: 12 % (ref 11.5–15.5)
WBC: 12.3 10*3/uL — ABNORMAL HIGH (ref 4.0–10.5)
nRBC: 0 % (ref 0.0–0.2)

## 2022-09-06 LAB — SURGICAL PCR SCREEN
MRSA, PCR: NEGATIVE
Staphylococcus aureus: NEGATIVE

## 2022-09-06 LAB — HEPARIN LEVEL (UNFRACTIONATED)
Heparin Unfractionated: 0.32 [IU]/mL (ref 0.30–0.70)
Heparin Unfractionated: 0.33 [IU]/mL (ref 0.30–0.70)

## 2022-09-06 SURGERY — LEFT HEART CATH AND CORONARY ANGIOGRAPHY
Anesthesia: LOCAL

## 2022-09-06 MED ORDER — CEFAZOLIN SODIUM-DEXTROSE 2-4 GM/100ML-% IV SOLN
2.0000 g | INTRAVENOUS | Status: AC
  Administered 2022-09-07 (×2): 2 g via INTRAVENOUS
  Filled 2022-09-06: qty 100

## 2022-09-06 MED ORDER — MILRINONE LACTATE IN DEXTROSE 20-5 MG/100ML-% IV SOLN
0.3000 ug/kg/min | INTRAVENOUS | Status: DC
  Filled 2022-09-06: qty 100

## 2022-09-06 MED ORDER — NOREPINEPHRINE 4 MG/250ML-% IV SOLN
0.0000 ug/min | INTRAVENOUS | Status: DC
  Filled 2022-09-06: qty 250

## 2022-09-06 MED ORDER — SODIUM CHLORIDE 0.9% FLUSH
3.0000 mL | Freq: Two times a day (BID) | INTRAVENOUS | Status: DC
  Administered 2022-09-06 – 2022-09-07 (×2): 3 mL via INTRAVENOUS

## 2022-09-06 MED ORDER — PHENYLEPHRINE HCL-NACL 20-0.9 MG/250ML-% IV SOLN
30.0000 ug/min | INTRAVENOUS | Status: DC
  Filled 2022-09-06: qty 250

## 2022-09-06 MED ORDER — TRANEXAMIC ACID (OHS) PUMP PRIME SOLUTION
2.0000 mg/kg | INTRAVENOUS | Status: DC
  Filled 2022-09-06: qty 1.71

## 2022-09-06 MED ORDER — SODIUM CHLORIDE 0.9 % WEIGHT BASED INFUSION: 3.0000 mL/kg/h | INTRAVENOUS | Status: DC

## 2022-09-06 MED ORDER — TRANEXAMIC ACID 1000 MG/10ML IV SOLN
1.5000 mg/kg/h | INTRAVENOUS | Status: AC
  Administered 2022-09-07: 1.5 mg/kg/h via INTRAVENOUS
  Filled 2022-09-06: qty 25

## 2022-09-06 MED ORDER — TRANEXAMIC ACID 1000 MG/10ML IV SOLN
1.5000 mg/kg/h | INTRAVENOUS | Status: DC
  Filled 2022-09-06: qty 25

## 2022-09-06 MED ORDER — SODIUM CHLORIDE 0.9 % IV SOLN: 250.0000 mL | INTRAVENOUS | Status: DC | PRN

## 2022-09-06 MED ORDER — CEFAZOLIN SODIUM-DEXTROSE 2-4 GM/100ML-% IV SOLN
2.0000 g | INTRAVENOUS | Status: DC
  Filled 2022-09-06: qty 100

## 2022-09-06 MED ORDER — MAGNESIUM SULFATE 50 % IJ SOLN
40.0000 meq | INTRAMUSCULAR | Status: DC
  Filled 2022-09-06: qty 9.85

## 2022-09-06 MED ORDER — POTASSIUM CHLORIDE 2 MEQ/ML IV SOLN
80.0000 meq | INTRAVENOUS | Status: DC
  Filled 2022-09-06: qty 40

## 2022-09-06 MED ORDER — METOPROLOL TARTRATE 12.5 MG HALF TABLET
12.5000 mg | ORAL_TABLET | Freq: Once | ORAL | Status: AC
  Administered 2022-09-07: 12.5 mg via ORAL
  Filled 2022-09-06: qty 1

## 2022-09-06 MED ORDER — EPINEPHRINE HCL 5 MG/250ML IV SOLN IN NS
0.0000 ug/min | INTRAVENOUS | Status: DC
  Filled 2022-09-06: qty 250

## 2022-09-06 MED ORDER — INSULIN REGULAR(HUMAN) IN NACL 100-0.9 UT/100ML-% IV SOLN
INTRAVENOUS | Status: DC
  Filled 2022-09-06: qty 100

## 2022-09-06 MED ORDER — LIDOCAINE HCL (PF) 1 % IJ SOLN
INTRAMUSCULAR | Status: DC | PRN
  Administered 2022-09-06: 5 mL
  Administered 2022-09-06: 12 mL

## 2022-09-06 MED ORDER — HEPARIN (PORCINE) IN NACL 1000-0.9 UT/500ML-% IV SOLN
INTRAVENOUS | Status: DC | PRN
  Administered 2022-09-06 (×2): 500 mL

## 2022-09-06 MED ORDER — FENTANYL CITRATE (PF) 100 MCG/2ML IJ SOLN
INTRAMUSCULAR | Status: AC
  Filled 2022-09-06: qty 2

## 2022-09-06 MED ORDER — PLASMA-LYTE A IV SOLN
INTRAVENOUS | Status: DC
  Filled 2022-09-06: qty 2.5

## 2022-09-06 MED ORDER — NITROGLYCERIN IN D5W 200-5 MCG/ML-% IV SOLN
2.0000 ug/min | INTRAVENOUS | Status: DC
  Filled 2022-09-06 (×2): qty 250

## 2022-09-06 MED ORDER — FENTANYL CITRATE (PF) 100 MCG/2ML IJ SOLN
INTRAMUSCULAR | Status: DC | PRN
  Administered 2022-09-06: 25 ug via INTRAVENOUS

## 2022-09-06 MED ORDER — SODIUM CHLORIDE 0.9 % IV SOLN
250.0000 mL | INTRAVENOUS | Status: DC | PRN
  Administered 2022-09-06: 250 mL via INTRAVENOUS

## 2022-09-06 MED ORDER — SODIUM CHLORIDE 0.9% FLUSH: 3.0000 mL | INTRAVENOUS | Status: DC | PRN

## 2022-09-06 MED ORDER — HEPARIN 30,000 UNITS/1000 ML (OHS) CELLSAVER SOLUTION
Status: DC
  Filled 2022-09-06: qty 1000

## 2022-09-06 MED ORDER — MORPHINE SULFATE (PF) 2 MG/ML IV SOLN
2.0000 mg | INTRAVENOUS | Status: DC | PRN
  Administered 2022-09-06: 2 mg via INTRAVENOUS

## 2022-09-06 MED ORDER — ASPIRIN 81 MG PO CHEW: 81.0000 mg | CHEWABLE_TABLET | ORAL | Status: DC

## 2022-09-06 MED ORDER — BISACODYL 5 MG PO TBEC: 5.0000 mg | DELAYED_RELEASE_TABLET | Freq: Once | ORAL | Status: DC

## 2022-09-06 MED ORDER — TEMAZEPAM 15 MG PO CAPS
15.0000 mg | ORAL_CAPSULE | Freq: Once | ORAL | Status: AC | PRN
  Administered 2022-09-07: 15 mg via ORAL
  Filled 2022-09-06: qty 1

## 2022-09-06 MED ORDER — CHLORHEXIDINE GLUCONATE CLOTH 2 % EX PADS
6.0000 | MEDICATED_PAD | Freq: Once | CUTANEOUS | Status: AC
  Administered 2022-09-06: 6 via TOPICAL

## 2022-09-06 MED ORDER — PHENYLEPHRINE HCL-NACL 20-0.9 MG/250ML-% IV SOLN
30.0000 ug/min | INTRAVENOUS | Status: AC
  Administered 2022-09-07: 25 ug/min via INTRAVENOUS
  Filled 2022-09-06: qty 250

## 2022-09-06 MED ORDER — TRANEXAMIC ACID (OHS) BOLUS VIA INFUSION
15.0000 mg/kg | INTRAVENOUS | Status: AC
  Administered 2022-09-07: 1282.5 mg via INTRAVENOUS
  Filled 2022-09-06: qty 1283

## 2022-09-06 MED ORDER — VANCOMYCIN HCL 1500 MG/300ML IV SOLN
1500.0000 mg | INTRAVENOUS | Status: AC
  Administered 2022-09-07: 1500 mg via INTRAVENOUS
  Filled 2022-09-06: qty 300

## 2022-09-06 MED ORDER — TRANEXAMIC ACID (OHS) BOLUS VIA INFUSION
15.0000 mg/kg | INTRAVENOUS | Status: DC
  Filled 2022-09-06: qty 1283

## 2022-09-06 MED ORDER — DEXMEDETOMIDINE HCL IN NACL 400 MCG/100ML IV SOLN
0.1000 ug/kg/h | INTRAVENOUS | Status: DC
  Filled 2022-09-06: qty 100

## 2022-09-06 MED ORDER — IOHEXOL 350 MG/ML SOLN
INTRAVENOUS | Status: DC | PRN
  Administered 2022-09-06: 20 mL

## 2022-09-06 MED ORDER — MORPHINE SULFATE (PF) 2 MG/ML IV SOLN
INTRAVENOUS | Status: AC
  Filled 2022-09-06: qty 1

## 2022-09-06 MED ORDER — CHLORHEXIDINE GLUCONATE CLOTH 2 % EX PADS
6.0000 | MEDICATED_PAD | Freq: Every day | CUTANEOUS | Status: DC
  Administered 2022-09-06 – 2022-09-07 (×2): 6 via TOPICAL

## 2022-09-06 MED ORDER — SODIUM CHLORIDE 0.9 % WEIGHT BASED INFUSION: 1.0000 mL/kg/h | INTRAVENOUS | Status: DC

## 2022-09-06 MED ORDER — DEXMEDETOMIDINE HCL IN NACL 400 MCG/100ML IV SOLN
0.1000 ug/kg/h | INTRAVENOUS | Status: AC
  Administered 2022-09-07: .3 ug/kg/h via INTRAVENOUS
  Filled 2022-09-06: qty 100

## 2022-09-06 MED ORDER — SODIUM CHLORIDE 0.9 % IV SOLN
INTRAVENOUS | Status: AC | PRN
  Administered 2022-09-06: 10 mL/h via INTRAVENOUS

## 2022-09-06 MED ORDER — HEPARIN (PORCINE) 25000 UT/250ML-% IV SOLN
1450.0000 [IU]/h | INTRAVENOUS | Status: DC
  Administered 2022-09-06 – 2022-09-07 (×2): 1450 [IU]/h via INTRAVENOUS
  Filled 2022-09-06 (×2): qty 250

## 2022-09-06 MED ORDER — MORPHINE SULFATE (PF) 2 MG/ML IV SOLN
2.0000 mg | Freq: Once | INTRAVENOUS | Status: AC
  Administered 2022-09-06: 2 mg via INTRAVENOUS
  Filled 2022-09-06: qty 1

## 2022-09-06 MED ORDER — CHLORHEXIDINE GLUCONATE CLOTH 2 % EX PADS
6.0000 | MEDICATED_PAD | Freq: Once | CUTANEOUS | Status: AC
  Administered 2022-09-06 – 2022-09-07 (×2): 6 via TOPICAL

## 2022-09-06 MED ORDER — INSULIN REGULAR(HUMAN) IN NACL 100-0.9 UT/100ML-% IV SOLN
INTRAVENOUS | Status: AC
  Administered 2022-09-07: 1 [IU]/h via INTRAVENOUS
  Filled 2022-09-06: qty 100

## 2022-09-06 MED ORDER — LIDOCAINE HCL (PF) 1 % IJ SOLN
INTRAMUSCULAR | Status: AC
  Filled 2022-09-06: qty 30

## 2022-09-06 MED ORDER — MUPIROCIN 2 % EX OINT: 1.0000 | TOPICAL_OINTMENT | Freq: Two times a day (BID) | CUTANEOUS | Status: DC

## 2022-09-06 MED ORDER — NITROGLYCERIN IN D5W 200-5 MCG/ML-% IV SOLN
2.0000 ug/min | INTRAVENOUS | Status: DC
  Filled 2022-09-06: qty 250

## 2022-09-06 MED ORDER — CHLORHEXIDINE GLUCONATE 0.12 % MT SOLN
15.0000 mL | Freq: Once | OROMUCOSAL | Status: AC
  Administered 2022-09-07: 15 mL via OROMUCOSAL
  Filled 2022-09-06: qty 15

## 2022-09-06 MED ORDER — VANCOMYCIN HCL 1500 MG/300ML IV SOLN
1500.0000 mg | INTRAVENOUS | Status: DC
  Filled 2022-09-06: qty 300

## 2022-09-06 SURGICAL SUPPLY — 11 items
BALLN IABP SENSA PLUS 8F 50CC (BALLOONS) ×1
CATH INFINITI 5FR MULTPACK ANG (CATHETERS) ×1
ELECT DEFIB PAD ADLT CADENCE (PAD) ×1
KIT ESSENTIALS PG (KITS) ×1
KIT HEART LEFT (KITS) ×1
KIT MICROPUNCTURE NIT STIFF (SHEATH) ×1
PACK CARDIAC CATHETERIZATION (CUSTOM PROCEDURE TRAY) ×1
SHEATH PINNACLE 5F 10CM (SHEATH) ×1
SHEATH PROBE COVER 6X72 (BAG) ×1
TRANSDUCER W/STOPCOCK (MISCELLANEOUS) ×1
WIRE EMERALD 3MM-J .035X150CM (WIRE) ×1

## 2022-09-06 NOTE — Progress Notes (Signed)
Orthopedic Tech Progress Note Patient Details:  Rodney Hall 04-30-1945 [id number]  Ortho Devices Type of Ortho Device: Knee Immobilizer Ortho Device/Splint Location: RLE Ortho Device/Splint Interventions: Ordered, Application, Adjustment   Post Interventions Patient Tolerated: Well Instructions Provided: Care of device, Adjustment of device  [name] 09/06/2022, 1:58 PM

## 2022-09-06 NOTE — Progress Notes (Signed)
   Notified by RN that patient was having 9/10 chest pain. He is currently on IV nitroglycerin, but nitroglycerin cannot be titrated further due to soft BP. Asked RN to obtain EKG and I ordered IV morphine PRN. Yesterday, morphine relieved patients chest pain completely   Patient now tentatively scheduled for bypass surgery on Monday, 7/8  Maezie Justin R. Eulon Allnutt, PA-C 09/06/2022 11:07 AM  

## 2022-09-06 NOTE — Progress Notes (Signed)
Pt received OHS book, Move in the Tube sheet, OHS careguide, and incentive spirometer (IS). Pt was educated on approx length of surgery and stay, importance of ambulation and using IS, restrictions, home needs, and CRPII.   Faustino Congress MS ACSM-CEP 09/06/2022 9:46 AM

## 2022-09-06 NOTE — Progress Notes (Signed)
ANTICOAGULATION CONSULT NOTE   Pharmacy Consult for heparin Indication: chest pain/ACS  Allergies  Allergen Reactions   Pravastatin     jittery   Amoxicillin Rash    ++ got ceftriaxone in the past++ Has patient had a PCN reaction causing immediate rash, facial/tongue/throat swelling, SOB or lightheadedness with hypotension: Yes Has patient had a PCN reaction causing severe rash involving mucus membranes or skin necrosis: No Has patient had a PCN reaction that required hospitalization No Has patient had a PCN reaction occurring within the last 10 years: No If all of the above answers are "NO", then may proceed with Cephalosporin use.      Patient Measurements:   Heparin Dosing Weight: 85 kg  Vital Signs: Temp: 98 F (36.7 C) (07/05 1315) Temp Source: Oral (07/05 1315) BP: 119/67 (07/05 1315) Pulse Rate: 89 (07/05 1316)  Labs: Recent Labs    09/03/22 1852 09/03/22 2045 09/03/22 2129 09/04/22 0300 09/04/22 0724 09/05/22 0337 09/05/22 1057 09/06/22 0209 09/06/22 0717  HGB 16.8  --   --  14.8  --  14.8  --  14.3  --   HCT 49.0  --   --  43.2  --  43.4  --  42.2  --   PLT 243  --   --  226  --  216  --  198  --   APTT  --   --  34  --   --   --   --   --   --   LABPROT  --   --  14.2  --   --   --   --   --   --   INR  --   --  1.1  --   --   --   --   --   --   HEPARINUNFRC  --   --   --  0.47   < > 0.29* 0.31 0.32  --   CREATININE 1.41*  --   --  1.12  --  1.21  --  1.49*  --   TROPONINIHS 11 599*  --   --   --   --   --   --  3,272*   < > = values in this interval not displayed.     Estimated Creatinine Clearance: 42.9 mL/min (A) (by C-G formula based on SCr of 1.49 mg/dL (H)).   Medical History: Past Medical History:  Diagnosis Date   Abdominal mass    Abdominal pain 06/02/2017   Anxiety    Pt. denies   Arthritis    pt. 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 No anticoagulation prior to admission. Pharmacy consulted for heparin.    He has had recurrent CP and now s/p IABP. Heparin to restart now   Goal of Therapy:  Heparin level 0.3-0.7 units/ml Monitor platelets by anticoagulation protocol: Yes   Plan:  Restart heparin 1450 units/hr   F/u 8hr level Monitor daily heparin level, CBC  Harland German, PharmD Clinical Pharmacist **Pharmacist phone directory can now be found on [email or web address] ([email or web address]).  Listed under [facility].

## 2022-09-06 NOTE — Progress Notes (Addendum)
   Patient Name: Rodney Hall. Date of Encounter: 09/06/2022 [facility] Cardiologist: None   Interval Summary  .    Patient reports that yesterday afternoon, he developed 7/10 chest pain. He was started on IV nitroglycerin with some improvement in pain, but pain has not fully resolved. Currently, his pain is about a 2/10. No shortness of breath, dizziness, ankle edema, syncope, near syncope   Vital Signs .    Vitals:   09/06/22 0634 09/06/22 0700 09/06/22 0800 09/06/22 0815  BP: (!) 110/58 115/68 107/62 106/67  Pulse: 88 86 86 94  Resp: 12   18  Temp:    99.3 F (37.4 C)  TempSrc:    Oral  SpO2: 92% 96% 97% 94%    Intake/Output Summary (Last 24 hours) at 09/06/2022 0836 Last data filed at 09/06/2022 0819 Gross per 24 hour  Intake 663.2 ml  Output 2050 ml  Net -1386.8 ml      09/03/2022    6:50 PM 07/03/2022   11:13 AM 02/22/2022    9:47 AM  Last 3 Weights  Weight (lbs) 188 lb 7.9 oz 188 lb 9.6 oz 198 lb  Weight (kg) 85.5 kg 85.548 kg 89.812 kg      Telemetry/ECG    NSR - Personally Reviewed  Physical Exam .   GEN: No acute distress.  Laying flat in the bed  Neck: No JVD Cardiac: RRR, no murmurs, rubs, or gallops. Radial pulses 2+ bilaterally. Right radial cath site soft, nontender  Respiratory: Clear to auscultation bilaterally. Normal work of breathing  GI: Soft, nontender, non-distended  MS: No edema in BLE   Assessment & Plan .     NSTEMI  Three Vessel CAD  - Patient presented as an NSTEMI with hsTn to 600 - Underwent cardiac catheterization 7/3 that showed severe three-vessel disease  - Plan CT surgery has been consulted- patient is tentatively scheduled for bypass surgery on 7/12. On IV heparin  - Patient has had recurrent chest pain since arriving to [facility]- currently on IV nitroglycerin. hsTn up to 3000 today  - Discussing case with CT surgery to see if surgery can be moved up given ongoing symptoms  - Continue ASA, lipitor, metoprolol    Ischemic cardiomyopathy - Echocardiogram pending today. EF was estimated to be around 30-35% on cath  - Continue metoprolol - BP low-normal on IV nitroglycerin. Plan to add GDMT as BP allows after surgery   CKD stage IIIa  - Creatinine slightly elevated to 1.49 today  - BMP ordered for tomorrow AM  - Avoid nephrotoxic medications   For questions or updates, please contact [facility] Please consult [email or web address] for contact info under        Signed, Jonita Albee, PA-C   I have personally seen and examined this patient. I agree with the assessment and plan as outlined above. Severe three vessel CAD by cath this week. Transferred to [facility] from [facility] for CABG consult. CABG planned for next Friday. With ongoing chest pain, will ask CT surgery to evaluate today to look at any options for moving up his CABG. Continue ASA, IV NTG, IV heparin and beta blocker.   Verne Carrow, MD, [facility] 09/06/2022 9:00 AM

## 2022-09-06 NOTE — Progress Notes (Signed)
ANTICOAGULATION CONSULT NOTE - Follow Up Consult  Pharmacy Consult for heparin Indication:  IABP  Labs: Recent Labs    09/04/22 0300 09/04/22 0724 09/05/22 0337 09/05/22 1057 09/06/22 0209 09/06/22 0717 09/06/22 1621 09/06/22 2143  HGB 14.8  --  14.8  --  14.3  --   --   --   HCT 43.2  --  43.4  --  42.2  --   --   --   PLT 226  --  216  --  198  --   --   --   HEPARINUNFRC 0.47   < > 0.29* 0.31 0.32  --   --  0.33  CREATININE 1.12  --  1.21  --  1.49*  --  1.45*  --   TROPONINIHS  --   --   --   --   --  3,272*  --   --    < > = values in this interval not displayed.    Assessment/Plan:  77yo male therapeutic on heparin after resuming s/p IABP placement. Will continue infusion at current rate of 1450 units/hr and confirm stable with am labs.  Plan for CABG in am.  Vernard Gambles, PharmD, BCPS 09/06/2022 11:53 PM

## 2022-09-06 NOTE — Interval H&P Note (Signed)
History and Physical Interval Note:  09/06/2022 11:43 AM  Rodney Bach.  has presented today for surgery, with the diagnosis of urgent.  The various methods of treatment have been discussed with the patient and family. After consideration of risks, benefits and other options for treatment, the patient has consented to  Procedure(s): LEFT HEART CATH AND CORONARY ANGIOGRAPHY (N/A) IABP Insertion (N/A) as a surgical intervention.  The patient's history has been reviewed, patient examined, no change in status, stable for surgery.  I have reviewed the patient's chart and labs.  Questions were answered to the patient's satisfaction.    Cath Lab Visit (complete for each Cath Lab visit)  Clinical Evaluation Leading to the Procedure:   ACS: Yes.    Non-ACS:    Anginal Classification: CCS IV  Anti-ischemic medical therapy: Maximal Therapy (2 or more classes of medications)  Non-Invasive Test Results: High-risk stress test findings: cardiac mortality >3%/year  Prior CABG: No previous CABG       Rodney Hall [facility] 09/06/2022 11:44 AM

## 2022-09-06 NOTE — Progress Notes (Signed)
   09/06/22 1100  Vitals  BP 112/67  MAP (mmHg) 80  Pulse Rate 96  ECG Heart Rate 96  MEWS COLOR  MEWS Score Color Green  Oxygen Therapy  SpO2 97 %  Pain Assessment  Pain Scale 0-10  Pain Score 9  Pain Type Acute pain  Pain Location Chest  Pain Descriptors / Indicators Dull  Pain Intervention(s) MD notified (Comment)  MEWS Score  MEWS Temp 0  MEWS Systolic 0  MEWS Pulse 0  MEWS RR 0  MEWS LOC 0  MEWS Score 0  Provider Notification  Provider Name/Title Adin Hector PA  Date Provider Notified 09/06/22  Time Provider Notified 1100  Method of Notification Page  Notification Reason Other (Comment) (chest pain)  Provider response See new orders  Date of Provider Response 09/06/22  Time of Provider Response 1110   C/o chest pain 9/10. PA K Johnson informed. Morphine ordered and given. EKG done

## 2022-09-06 NOTE — Progress Notes (Addendum)
ANTICOAGULATION CONSULT NOTE - Initial Consult  Pharmacy Consult for heparin Indication: chest pain/ACS  Allergies  Allergen Reactions   Pravastatin     jittery   Amoxicillin Rash    ++ got ceftriaxone in the past++ Has patient had a PCN reaction causing immediate rash, facial/tongue/throat swelling, SOB or lightheadedness with hypotension: Yes Has patient had a PCN reaction causing severe rash involving mucus membranes or skin necrosis: No Has patient had a PCN reaction that required hospitalization No Has patient had a PCN reaction occurring within the last 10 years: No If all of the above answers are "NO", then may proceed with Cephalosporin use.      Patient Measurements:   Heparin Dosing Weight: 85 kg  Vital Signs: Temp: 99.3 F (37.4 C) (07/05 0815) Temp Source: Oral (07/05 0815) BP: 106/67 (07/05 0815) Pulse Rate: 94 (07/05 0815)  Labs: Recent Labs    09/03/22 1852 09/03/22 2045 09/03/22 2129 09/04/22 0300 09/04/22 0724 09/05/22 0337 09/05/22 1057 09/06/22 0209 09/06/22 0717  HGB 16.8  --   --  14.8  --  14.8  --  14.3  --   HCT 49.0  --   --  43.2  --  43.4  --  42.2  --   PLT 243  --   --  226  --  216  --  198  --   APTT  --   --  34  --   --   --   --   --   --   LABPROT  --   --  14.2  --   --   --   --   --   --   INR  --   --  1.1  --   --   --   --   --   --   HEPARINUNFRC  --   --   --  0.47   < > 0.29* 0.31 0.32  --   CREATININE 1.41*  --   --  1.12  --  1.21  --  1.49*  --   TROPONINIHS 11 599*  --   --   --   --   --   --  3,272*   < > = values in this interval not displayed.    Estimated Creatinine Clearance: 42.9 mL/min (A) (by C-G formula based on SCr of 1.49 mg/dL (H)).   Medical History: Past Medical History:  Diagnosis Date   Abdominal mass    Abdominal pain 06/02/2017   Anxiety    Pt. denies   Arthritis    pt. 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 No anticoagulation prior to admission. Pharmacy consulted for heparin.    Heparin level 0.32 is at low end of therapeutic on 1350 units/hr. Patient with on-going chest pain on NTG infusion. Will target middle of goal range. Planning for CABG.    Goal of Therapy:  Heparin level 0.3-0.7 units/ml Monitor platelets by anticoagulation protocol: Yes  Plan:  Increase heparin 1450 units/hr   F/u 8hr level Monitor daily heparin level, CBC Monitor for signs/symptoms of bleeding  F/u CABG plan  Alphia Moh, PharmD, BCPS, BCCP Clinical Pharmacist  Please check AMION for all [facility] Pharmacy phone numbers After 10:00 PM, call Main Pharmacy [phone]

## 2022-09-06 NOTE — Progress Notes (Signed)
   09/06/22 0846  Provider Notification  Provider Name/Title NP Adin Hector  Date Provider Notified 09/06/22  Time Provider Notified [phone]  Method of Notification Rounds  Notification Reason Critical Result  Test performed and critical result Troponin 3272  Date Critical Result Received 09/06/22  Time Critical Result Received 0830  Provider response At bedside  Date of Provider Response 09/06/22  Time of Provider Response 0846   Tropnin 3272 Provider at bedside aware.

## 2022-09-06 NOTE — Progress Notes (Signed)
Pre-CABG testing has been completed. Preliminary results can be found in CV Proc through chart review.   09/06/22 5:28 PM Olen Cordial RVT

## 2022-09-06 NOTE — Progress Notes (Signed)
Day of Surgery Procedure(s) (LRB): LEFT HEART CATH AND CORONARY ANGIOGRAPHY (N/A) IABP Insertion (N/A) Subjective: He had recurrent chest pain this am and taken back to cath lab. No change in cath findings. LVEDP 25. IABP inserted and pain resolved. Had been pain free since.  Objective: Vital signs in last 24 hours: Temp:  [98 F (36.7 C)-99.3 F (37.4 C)] 98 F (36.7 C) (07/05 1330) Pulse Rate:  [69-266] 259 (07/05 1500) Cardiac Rhythm: Normal sinus rhythm (07/05 1330) Resp:  [12-35] 24 (07/05 1500) BP: (85-127)/(51-83) 118/51 (07/05 1500) SpO2:  [90 %-100 %] 93 % (07/05 1500) Arterial Line BP: (105-126)/(43-52) 126/52 (07/05 1330) Weight:  [85.5 kg] 85.5 kg (07/05 1330)  Hemodynamic parameters for last 24 hours:    Intake/Output from previous day: 07/04 0701 - 07/05 0700 In: 559.8 [P.O.:220; I.V.:339.8] Out: 2050 [Urine:2050] Intake/Output this shift: Total I/O In: 249.5 [I.V.:249.5] Out: -   General appearance: alert and cooperative Heart: regular rate and rhythm, S1, S2 normal, no murmur Lungs: clear to auscultation bilaterally Extremities: extremities normal, warm, no cyanosis or edema  Lab Results: Recent Labs    09/05/22 0337 09/06/22 0209  WBC 13.3* 12.3*  HGB 14.8 14.3  HCT 43.4 42.2  PLT 216 198   BMET:  Recent Labs    09/05/22 0337 09/06/22 0209  NA 134* 135  K 3.9 3.9  CL 103 103  CO2 22 19*  GLUCOSE 135* 128*  BUN 13 15  CREATININE 1.21 1.49*  CALCIUM 8.8* 8.7*    PT/INR:  Recent Labs    09/03/22 2129  LABPROT 14.2  INR 1.1   ABG No results found for: "PHART", "HCO3", "TCO2", "ACIDBASEDEF", "O2SAT" CBG (last 3)  No results for input(s): "GLUCAP" in the last 72 hours.  Assessment/Plan:  Severe multivessel CAD with NSTEMI, recurrent angina requiring IABP. I reviewed the case with Dr. Hendrickson and I think it would be best to proceed with CABG tomorrow morning since he has IABP in. This will minimize the risk of further ischemia,  IABP complications and allow mobilization faster. I discussed the operative procedure with the patient and his wife including alternatives, benefits and risks; including but not limited to bleeding, blood transfusion, infection, stroke, myocardial infarction, graft failure, heart block requiring a permanent pacemaker, organ dysfunction, and death.  Zhaire L Shavers Jr. understands and agrees to proceed.    LOS: 2 days    Kyl Givler K Donesha Wallander 09/06/2022   

## 2022-09-06 NOTE — Progress Notes (Signed)
Pre-CABG study attempted. Patient going to cath lab.   Will attempt again as schedule and patient availability permits.   Jean Rosenthal, RDMS, RVT

## 2022-09-06 NOTE — Progress Notes (Signed)
Cardiology Note:  Called by nursing that patient is having 9/10 chest pain. EKG with new subtle precordial ST elevation with presence of Q waves but clearly evolved.  CABG now moved up to Monday.  I have reviewed the case with Dr. [place] and will bring the patient to the cath lab for IABP placement and re-look cath. If one of the vessels is closed, will perform balloon angioplasty to open the vessel and stabilize for bypass. If the CAD is unchanged, will follow with the IABP in place. If he continues to have severe chest pain despite IABP, may need emergent CABG today.  Dr. Dorris Fetch is in the room and we have reviewed the plan with the patient's family.   Verne Carrow, MD, [facility] 09/06/2022 11:37 AM

## 2022-09-06 NOTE — H&P (View-Only) (Signed)
   Notified by RN that patient was having 9/10 chest pain. He is currently on IV nitroglycerin, but nitroglycerin cannot be titrated further due to soft BP. Asked RN to obtain EKG and I ordered IV morphine PRN. Yesterday, morphine relieved patients chest pain completely   Patient now tentatively scheduled for bypass surgery on Monday, 7/8  Jonita Albee, PA-C 09/06/2022 11:07 AM

## 2022-09-06 NOTE — H&P (View-Only) (Signed)
Day of Surgery Procedure(s) (LRB): LEFT HEART CATH AND CORONARY ANGIOGRAPHY (N/A) IABP Insertion (N/A) Subjective: He had recurrent chest pain this am and taken back to cath lab. No change in cath findings. LVEDP 25. IABP inserted and pain resolved. Had been pain free since.  Objective: Vital signs in last 24 hours: Temp:  [98 F (36.7 C)-99.3 F (37.4 C)] 98 F (36.7 C) (07/05 1330) Pulse Rate:  [69-266] 259 (07/05 1500) Cardiac Rhythm: Normal sinus rhythm (07/05 1330) Resp:  [12-35] 24 (07/05 1500) BP: (85-127)/(51-83) 118/51 (07/05 1500) SpO2:  [90 %-100 %] 93 % (07/05 1500) Arterial Line BP: (105-126)/(43-52) 126/52 (07/05 1330) Weight:  [85.5 kg] 85.5 kg (07/05 1330)  Hemodynamic parameters for last 24 hours:    Intake/Output from previous day: 07/04 0701 - 07/05 0700 In: 559.8 [P.O.:220; I.V.:339.8] Out: 2050 [Urine:2050] Intake/Output this shift: Total I/O In: 249.5 [I.V.:249.5] Out: -   General appearance: alert and cooperative Heart: regular rate and rhythm, S1, S2 normal, no murmur Lungs: clear to auscultation bilaterally Extremities: extremities normal, warm, no cyanosis or edema  Lab Results: Recent Labs    09/05/22 0337 09/06/22 0209  WBC 13.3* 12.3*  HGB 14.8 14.3  HCT 43.4 42.2  PLT 216 198   BMET:  Recent Labs    09/05/22 0337 09/06/22 0209  NA 134* 135  K 3.9 3.9  CL 103 103  CO2 22 19*  GLUCOSE 135* 128*  BUN 13 15  CREATININE 1.21 1.49*  CALCIUM 8.8* 8.7*    PT/INR:  Recent Labs    09/03/22 2129  LABPROT 14.2  INR 1.1   ABG No results found for: "PHART", "HCO3", "TCO2", "ACIDBASEDEF", "O2SAT" CBG (last 3)  No results for input(s): "GLUCAP" in the last 72 hours.  Assessment/Plan:  Severe multivessel CAD with NSTEMI, recurrent angina requiring IABP. I reviewed the case with Rodney Hall and I think it would be best to proceed with CABG tomorrow morning since he has IABP in. This will minimize the risk of further ischemia,  IABP complications and allow mobilization faster. I discussed the operative procedure with the patient and his wife including alternatives, benefits and risks; including but not limited to bleeding, blood transfusion, infection, stroke, myocardial infarction, graft failure, heart block requiring a permanent pacemaker, organ dysfunction, and death.  Rodney Hall. understands and agrees to proceed.    LOS: 2 days    Rodney Hall 09/06/2022

## 2022-09-06 NOTE — Progress Notes (Signed)
  Patient with recurrent episodes of chest pain throughout the night. NTG gtt uptitrated, now at 35 mcg/min. Received IV morphine as well. Multiple ECGs have been reassuring. At this moment, patient is chest pain free, hemodynamically and electrically stable. If his pain recurs he will need an IABP as further uptitration of nitroglycerin will be limited due to BP.

## 2022-09-07 ENCOUNTER — Inpatient Hospital Stay (HOSPITAL_COMMUNITY): Payer: HMO

## 2022-09-07 ENCOUNTER — Inpatient Hospital Stay (HOSPITAL_COMMUNITY): Payer: HMO | Admitting: Anesthesiology

## 2022-09-07 ENCOUNTER — Inpatient Hospital Stay (HOSPITAL_COMMUNITY)
Disposition: A | Discharge status: Home / Self Care | Payer: Self-pay | Source: Other Acute Inpatient Hospital | Attending: Cardiovascular Disease

## 2022-09-07 DIAGNOSIS — N1832 Chronic kidney disease, stage 3b: Secondary | ICD-10-CM

## 2022-09-07 DIAGNOSIS — I252 Old myocardial infarction: Secondary | ICD-10-CM

## 2022-09-07 DIAGNOSIS — I129 Hypertensive chronic kidney disease with stage 1 through stage 4 chronic kidney disease, or unspecified chronic kidney disease: Secondary | ICD-10-CM

## 2022-09-07 DIAGNOSIS — I2510 Atherosclerotic heart disease of native coronary artery without angina pectoris: Secondary | ICD-10-CM

## 2022-09-07 DIAGNOSIS — I5020 Unspecified systolic (congestive) heart failure: Secondary | ICD-10-CM

## 2022-09-07 HISTORY — PX: CORONARY ARTERY BYPASS GRAFT: SHX141

## 2022-09-07 HISTORY — PX: TEE WITHOUT CARDIOVERSION: SHX5443

## 2022-09-07 LAB — POCT I-STAT 7, (LYTES, BLD GAS, ICA,H+H)
Acid-base deficit: 2 mmol/L (ref 0.0–2.0)
Acid-base deficit: 4 mmol/L — ABNORMAL HIGH (ref 0.0–2.0)
Acid-base deficit: 2 mmol/L (ref 0.0–2.0)
Acid-base deficit: 3 mmol/L — ABNORMAL HIGH (ref 0.0–2.0)
Acid-base deficit: 3 mmol/L — ABNORMAL HIGH (ref 0.0–2.0)
Acid-base deficit: 4 mmol/L — ABNORMAL HIGH (ref 0.0–2.0)
Acid-base deficit: 5 mmol/L — ABNORMAL HIGH (ref 0.0–2.0)
Acid-base deficit: 5 mmol/L — ABNORMAL HIGH (ref 0.0–2.0)
Bicarbonate: 22.3 mmol/L (ref 20.0–28.0)
Bicarbonate: 21.5 mmol/L (ref 20.0–28.0)
Bicarbonate: 22.2 mmol/L (ref 20.0–28.0)
Bicarbonate: 23.8 mmol/L (ref 20.0–28.0)
Bicarbonate: 22.4 mmol/L (ref 20.0–28.0)
Bicarbonate: 21.1 mmol/L (ref 20.0–28.0)
Bicarbonate: 20.3 mmol/L (ref 20.0–28.0)
Bicarbonate: 20.8 mmol/L (ref 20.0–28.0)
Calcium, Ion: 1.2 mmol/L (ref 1.15–1.40)
Calcium, Ion: 1.16 mmol/L (ref 1.15–1.40)
Calcium, Ion: 1.01 mmol/L — ABNORMAL LOW (ref 1.15–1.40)
Calcium, Ion: 1.1 mmol/L — ABNORMAL LOW (ref 1.15–1.40)
Calcium, Ion: 1.1 mmol/L — ABNORMAL LOW (ref 1.15–1.40)
Calcium, Ion: 1.22 mmol/L (ref 1.15–1.40)
Calcium, Ion: 1.2 mmol/L (ref 1.15–1.40)
Calcium, Ion: 1.25 mmol/L (ref 1.15–1.40)
HCT: 35 % — ABNORMAL LOW (ref 39.0–52.0)
HCT: 33 % — ABNORMAL LOW (ref 39.0–52.0)
HCT: 26 % — ABNORMAL LOW (ref 39.0–52.0)
HCT: 29 % — ABNORMAL LOW (ref 39.0–52.0)
HCT: 30 % — ABNORMAL LOW (ref 39.0–52.0)
HCT: 27 % — ABNORMAL LOW (ref 39.0–52.0)
HCT: 28 % — ABNORMAL LOW (ref 39.0–52.0)
HCT: 29 % — ABNORMAL LOW (ref 39.0–52.0)
Hemoglobin: 11.9 g/dL — ABNORMAL LOW (ref 13.0–17.0)
Hemoglobin: 11.2 g/dL — ABNORMAL LOW (ref 13.0–17.0)
Hemoglobin: 8.8 g/dL — ABNORMAL LOW (ref 13.0–17.0)
Hemoglobin: 9.9 g/dL — ABNORMAL LOW (ref 13.0–17.0)
Hemoglobin: 10.2 g/dL — ABNORMAL LOW (ref 13.0–17.0)
Hemoglobin: 9.2 g/dL — ABNORMAL LOW (ref 13.0–17.0)
Hemoglobin: 9.5 g/dL — ABNORMAL LOW (ref 13.0–17.0)
Hemoglobin: 9.9 g/dL — ABNORMAL LOW (ref 13.0–17.0)
O2 Saturation: 91 %
O2 Saturation: 100 %
O2 Saturation: 100 %
O2 Saturation: 100 %
O2 Saturation: 100 %
O2 Saturation: 99 %
O2 Saturation: 96 %
O2 Saturation: 97 %
Patient temperature: 98.2
Patient temperature: 36.8
Patient temperature: 97.7
Potassium: 3.9 mmol/L (ref 3.5–5.1)
Potassium: 3.7 mmol/L (ref 3.5–5.1)
Potassium: 4.6 mmol/L (ref 3.5–5.1)
Potassium: 4.7 mmol/L (ref 3.5–5.1)
Potassium: 5.3 mmol/L — ABNORMAL HIGH (ref 3.5–5.1)
Potassium: 4.8 mmol/L (ref 3.5–5.1)
Potassium: 4.5 mmol/L (ref 3.5–5.1)
Potassium: 4.6 mmol/L (ref 3.5–5.1)
Sodium: 135 mmol/L (ref 135–145)
Sodium: 137 mmol/L (ref 135–145)
Sodium: 136 mmol/L (ref 135–145)
Sodium: 137 mmol/L (ref 135–145)
Sodium: 133 mmol/L — ABNORMAL LOW (ref 135–145)
Sodium: 134 mmol/L — ABNORMAL LOW (ref 135–145)
Sodium: 137 mmol/L (ref 135–145)
Sodium: 133 mmol/L — ABNORMAL LOW (ref 135–145)
TCO2: 23 mmol/L (ref 22–32)
TCO2: 23 mmol/L (ref 22–32)
TCO2: 23 mmol/L (ref 22–32)
TCO2: 25 mmol/L (ref 22–32)
TCO2: 24 mmol/L (ref 22–32)
TCO2: 22 mmol/L (ref 22–32)
TCO2: 21 mmol/L — ABNORMAL LOW (ref 22–32)
TCO2: 22 mmol/L (ref 22–32)
pCO2 arterial: 33.7 mmHg (ref 32–48)
pCO2 arterial: 38.3 mmHg (ref 32–48)
pCO2 arterial: 37.8 mmHg (ref 32–48)
pCO2 arterial: 46 mmHg (ref 32–48)
pCO2 arterial: 39.3 mmHg (ref 32–48)
pCO2 arterial: 38.9 mmHg (ref 32–48)
pCO2 arterial: 35.8 mmHg (ref 32–48)
pCO2 arterial: 37.9 mmHg (ref 32–48)
pH, Arterial: 7.428 (ref 7.35–7.45)
pH, Arterial: 7.357 (ref 7.35–7.45)
pH, Arterial: 7.322 — ABNORMAL LOW (ref 7.35–7.45)
pH, Arterial: 7.377 (ref 7.35–7.45)
pH, Arterial: 7.363 (ref 7.35–7.45)
pH, Arterial: 7.343 — ABNORMAL LOW (ref 7.35–7.45)
pH, Arterial: 7.362 (ref 7.35–7.45)
pH, Arterial: 7.345 — ABNORMAL LOW (ref 7.35–7.45)
pO2, Arterial: 58 mmHg — ABNORMAL LOW (ref 83–108)
pO2, Arterial: 261 mmHg — ABNORMAL HIGH (ref 83–108)
pO2, Arterial: 329 mmHg — ABNORMAL HIGH (ref 83–108)
pO2, Arterial: 369 mmHg — ABNORMAL HIGH (ref 83–108)
pO2, Arterial: 322 mmHg — ABNORMAL HIGH (ref 83–108)
pO2, Arterial: 164 mmHg — ABNORMAL HIGH (ref 83–108)
pO2, Arterial: 86 mmHg (ref 83–108)
pO2, Arterial: 93 mmHg (ref 83–108)

## 2022-09-07 LAB — BASIC METABOLIC PANEL WITH GFR
Anion gap: 9 (ref 5–15)
Anion gap: 8 (ref 5–15)
BUN: 15 mg/dL (ref 8–23)
BUN: 17 mg/dL (ref 8–23)
CO2: 24 mmol/L (ref 22–32)
CO2: 20 mmol/L — ABNORMAL LOW (ref 22–32)
Calcium: 8.5 mg/dL — ABNORMAL LOW (ref 8.9–10.3)
Calcium: 7.9 mg/dL — ABNORMAL LOW (ref 8.9–10.3)
Chloride: 101 mmol/L (ref 98–111)
Chloride: 105 mmol/L (ref 98–111)
Creatinine, Ser: 1.23 mg/dL (ref 0.61–1.24)
Creatinine, Ser: 1.31 mg/dL — ABNORMAL HIGH (ref 0.61–1.24)
GFR, Estimated: >60 mL/min
GFR, Estimated: 56 mL/min — ABNORMAL LOW
Glucose, Bld: 115 mg/dL — ABNORMAL HIGH (ref 70–99)
Glucose, Bld: 136 mg/dL — ABNORMAL HIGH (ref 70–99)
Potassium: 3.8 mmol/L (ref 3.5–5.1)
Potassium: 4.5 mmol/L (ref 3.5–5.1)
Sodium: 134 mmol/L — ABNORMAL LOW (ref 135–145)
Sodium: 133 mmol/L — ABNORMAL LOW (ref 135–145)

## 2022-09-07 LAB — GLUCOSE, CAPILLARY
Glucose-Capillary: 117 mg/dL — ABNORMAL HIGH (ref 70–99)
Glucose-Capillary: 137 mg/dL — ABNORMAL HIGH (ref 70–99)
Glucose-Capillary: 147 mg/dL — ABNORMAL HIGH (ref 70–99)
Glucose-Capillary: 120 mg/dL — ABNORMAL HIGH (ref 70–99)
Glucose-Capillary: 120 mg/dL — ABNORMAL HIGH (ref 70–99)
Glucose-Capillary: 113 mg/dL — ABNORMAL HIGH (ref 70–99)
Glucose-Capillary: 138 mg/dL — ABNORMAL HIGH (ref 70–99)
Glucose-Capillary: 139 mg/dL — ABNORMAL HIGH (ref 70–99)

## 2022-09-07 LAB — CBC
HCT: 37.3 % — ABNORMAL LOW (ref 39.0–52.0)
HCT: 29.2 % — ABNORMAL LOW (ref 39.0–52.0)
HCT: 30.9 % — ABNORMAL LOW (ref 39.0–52.0)
Hemoglobin: 12.5 g/dL — ABNORMAL LOW (ref 13.0–17.0)
Hemoglobin: 9.9 g/dL — ABNORMAL LOW (ref 13.0–17.0)
Hemoglobin: 10.4 g/dL — ABNORMAL LOW (ref 13.0–17.0)
MCH: 31.5 pg (ref 26.0–34.0)
MCH: 32.1 pg (ref 26.0–34.0)
MCH: 32.2 pg (ref 26.0–34.0)
MCHC: 33.5 g/dL (ref 30.0–36.0)
MCHC: 33.9 g/dL (ref 30.0–36.0)
MCHC: 33.7 g/dL (ref 30.0–36.0)
MCV: 94 fL (ref 80.0–100.0)
MCV: 94.8 fL (ref 80.0–100.0)
MCV: 95.7 fL (ref 80.0–100.0)
Platelets: 178 10*3/uL (ref 150–400)
Platelets: 113 10*3/uL — ABNORMAL LOW (ref 150–400)
Platelets: 132 10*3/uL — ABNORMAL LOW (ref 150–400)
RBC: 3.97 MIL/uL — ABNORMAL LOW (ref 4.22–5.81)
RBC: 3.08 MIL/uL — ABNORMAL LOW (ref 4.22–5.81)
RBC: 3.23 MIL/uL — ABNORMAL LOW (ref 4.22–5.81)
RDW: 11.8 % (ref 11.5–15.5)
RDW: 11.9 % (ref 11.5–15.5)
RDW: 11.9 % (ref 11.5–15.5)
WBC: 14 10*3/uL — ABNORMAL HIGH (ref 4.0–10.5)
WBC: 15.3 10*3/uL — ABNORMAL HIGH (ref 4.0–10.5)
WBC: 14.1 10*3/uL — ABNORMAL HIGH (ref 4.0–10.5)
nRBC: 0 % (ref 0.0–0.2)
nRBC: 0 % (ref 0.0–0.2)
nRBC: 0 % (ref 0.0–0.2)

## 2022-09-07 LAB — POCT I-STAT, CHEM 8
BUN: 16 mg/dL (ref 8–23)
BUN: 17 mg/dL (ref 8–23)
BUN: 16 mg/dL (ref 8–23)
BUN: 16 mg/dL (ref 8–23)
BUN: 18 mg/dL (ref 8–23)
Calcium, Ion: 1.19 mmol/L (ref 1.15–1.40)
Calcium, Ion: 1.19 mmol/L (ref 1.15–1.40)
Calcium, Ion: 1.1 mmol/L — ABNORMAL LOW (ref 1.15–1.40)
Calcium, Ion: 1.12 mmol/L — ABNORMAL LOW (ref 1.15–1.40)
Calcium, Ion: 1.24 mmol/L (ref 1.15–1.40)
Chloride: 103 mmol/L (ref 98–111)
Chloride: 101 mmol/L (ref 98–111)
Chloride: 102 mmol/L (ref 98–111)
Chloride: 102 mmol/L (ref 98–111)
Chloride: 101 mmol/L (ref 98–111)
Creatinine, Ser: 1.2 mg/dL (ref 0.61–1.24)
Creatinine, Ser: 1.2 mg/dL (ref 0.61–1.24)
Creatinine, Ser: 1.2 mg/dL (ref 0.61–1.24)
Creatinine, Ser: 1.2 mg/dL (ref 0.61–1.24)
Creatinine, Ser: 1.1 mg/dL (ref 0.61–1.24)
Glucose, Bld: 103 mg/dL — ABNORMAL HIGH (ref 70–99)
Glucose, Bld: 120 mg/dL — ABNORMAL HIGH (ref 70–99)
Glucose, Bld: 120 mg/dL — ABNORMAL HIGH (ref 70–99)
Glucose, Bld: 140 mg/dL — ABNORMAL HIGH (ref 70–99)
Glucose, Bld: 156 mg/dL — ABNORMAL HIGH (ref 70–99)
HCT: 35 % — ABNORMAL LOW (ref 39.0–52.0)
HCT: 32 % — ABNORMAL LOW (ref 39.0–52.0)
HCT: 28 % — ABNORMAL LOW (ref 39.0–52.0)
HCT: 29 % — ABNORMAL LOW (ref 39.0–52.0)
HCT: 29 % — ABNORMAL LOW (ref 39.0–52.0)
Hemoglobin: 11.9 g/dL — ABNORMAL LOW (ref 13.0–17.0)
Hemoglobin: 10.9 g/dL — ABNORMAL LOW (ref 13.0–17.0)
Hemoglobin: 9.5 g/dL — ABNORMAL LOW (ref 13.0–17.0)
Hemoglobin: 9.9 g/dL — ABNORMAL LOW (ref 13.0–17.0)
Hemoglobin: 9.9 g/dL — ABNORMAL LOW (ref 13.0–17.0)
Potassium: 3.8 mmol/L (ref 3.5–5.1)
Potassium: 4 mmol/L (ref 3.5–5.1)
Potassium: 4.5 mmol/L (ref 3.5–5.1)
Potassium: 5 mmol/L (ref 3.5–5.1)
Potassium: 4.8 mmol/L (ref 3.5–5.1)
Sodium: 137 mmol/L (ref 135–145)
Sodium: 135 mmol/L (ref 135–145)
Sodium: 134 mmol/L — ABNORMAL LOW (ref 135–145)
Sodium: 133 mmol/L — ABNORMAL LOW (ref 135–145)
Sodium: 134 mmol/L — ABNORMAL LOW (ref 135–145)
TCO2: 24 mmol/L (ref 22–32)
TCO2: 26 mmol/L (ref 22–32)
TCO2: 23 mmol/L (ref 22–32)
TCO2: 24 mmol/L (ref 22–32)
TCO2: 24 mmol/L (ref 22–32)

## 2022-09-07 LAB — POCT I-STAT EG7
Acid-base deficit: 2 mmol/L (ref 0.0–2.0)
Bicarbonate: 24.2 mmol/L (ref 20.0–28.0)
Calcium, Ion: 1.04 mmol/L — ABNORMAL LOW (ref 1.15–1.40)
HCT: 27 % — ABNORMAL LOW (ref 39.0–52.0)
Hemoglobin: 9.2 g/dL — ABNORMAL LOW (ref 13.0–17.0)
O2 Saturation: 82 %
Potassium: 4.4 mmol/L (ref 3.5–5.1)
Sodium: 137 mmol/L (ref 135–145)
TCO2: 26 mmol/L (ref 22–32)
pCO2, Ven: 48.5 mmHg (ref 44–60)
pH, Ven: 7.306 (ref 7.25–7.43)
pO2, Ven: 51 mmHg — ABNORMAL HIGH (ref 32–45)

## 2022-09-07 LAB — PLATELET COUNT: Platelets: 143 10*3/uL — ABNORMAL LOW (ref 150–400)

## 2022-09-07 LAB — VAS US DOPPLER PRE CABG: Left ABI: 1.04

## 2022-09-07 LAB — HEMOGLOBIN AND HEMATOCRIT, BLOOD
HCT: 29.9 % — ABNORMAL LOW (ref 39.0–52.0)
Hemoglobin: 10.1 g/dL — ABNORMAL LOW (ref 13.0–17.0)

## 2022-09-07 LAB — LIPOPROTEIN A (LPA): Lipoprotein (a): 10 nmol/L

## 2022-09-07 LAB — HEPARIN LEVEL (UNFRACTIONATED): Heparin Unfractionated: 0.23 [IU]/mL — ABNORMAL LOW (ref 0.30–0.70)

## 2022-09-07 LAB — APTT: aPTT: 54 s — ABNORMAL HIGH (ref 24–36)

## 2022-09-07 LAB — PROTIME-INR
INR: 1.6 — ABNORMAL HIGH (ref 0.8–1.2)
Prothrombin Time: 19.1 s — ABNORMAL HIGH (ref 11.4–15.2)

## 2022-09-07 LAB — MAGNESIUM: Magnesium: 3 mg/dL — ABNORMAL HIGH (ref 1.7–2.4)

## 2022-09-07 SURGERY — CORONARY ARTERY BYPASS GRAFTING (CABG)
Anesthesia: General | Site: Chest

## 2022-09-07 MED ORDER — METOPROLOL TARTRATE 25 MG/10 ML ORAL SUSPENSION
12.5000 mg | Freq: Two times a day (BID) | ORAL | Status: DC
  Filled 2022-09-07: qty 10

## 2022-09-07 MED ORDER — PHENYLEPHRINE 80 MCG/ML (10ML) SYRINGE FOR IV PUSH (FOR BLOOD PRESSURE SUPPORT)
PREFILLED_SYRINGE | INTRAVENOUS | Status: AC
  Filled 2022-09-07: qty 30

## 2022-09-07 MED ORDER — BISACODYL 10 MG RE SUPP: 10.0000 mg | Freq: Every day | RECTAL | Status: DC

## 2022-09-07 MED ORDER — FENTANYL CITRATE (PF) 250 MCG/5ML IJ SOLN
INTRAMUSCULAR | Status: AC
  Filled 2022-09-07: qty 5

## 2022-09-07 MED ORDER — ALBUMIN HUMAN 5 % IV SOLN: INTRAVENOUS | Status: DC | PRN

## 2022-09-07 MED ORDER — METOCLOPRAMIDE HCL 5 MG/ML IJ SOLN
10.0000 mg | Freq: Four times a day (QID) | INTRAMUSCULAR | Status: AC
  Administered 2022-09-07 – 2022-09-08 (×6): 10 mg via INTRAVENOUS
  Filled 2022-09-07 (×6): qty 2

## 2022-09-07 MED ORDER — EPHEDRINE SULFATE-NACL 50-0.9 MG/10ML-% IV SOSY
PREFILLED_SYRINGE | INTRAVENOUS | Status: DC | PRN
  Administered 2022-09-07: 5 mg via INTRAVENOUS
  Administered 2022-09-07: 10 mg via INTRAVENOUS

## 2022-09-07 MED ORDER — NITROGLYCERIN IN D5W 200-5 MCG/ML-% IV SOLN: 5.0000 ug/min | INTRAVENOUS | Status: DC

## 2022-09-07 MED ORDER — SODIUM CHLORIDE 0.9% FLUSH: 3.0000 mL | INTRAVENOUS | Status: DC | PRN

## 2022-09-07 MED ORDER — PROPOFOL 10 MG/ML IV BOLUS
INTRAVENOUS | Status: AC
  Filled 2022-09-07: qty 20

## 2022-09-07 MED ORDER — CALCIUM CHLORIDE 10 % IV SOLN
INTRAVENOUS | Status: AC
  Filled 2022-09-07: qty 10

## 2022-09-07 MED ORDER — MIDAZOLAM HCL (PF) 5 MG/ML IJ SOLN
INTRAMUSCULAR | Status: DC | PRN
  Administered 2022-09-07: 4 mg via INTRAVENOUS
  Administered 2022-09-07: 2 mg via INTRAVENOUS
  Administered 2022-09-07 (×2): 1 mg via INTRAVENOUS

## 2022-09-07 MED ORDER — ACETAMINOPHEN 160 MG/5ML PO SOLN
650.0000 mg | Freq: Once | ORAL | Status: AC
  Administered 2022-09-07: 650 mg

## 2022-09-07 MED ORDER — ALBUMIN HUMAN 5 % IV SOLN
250.0000 mL | INTRAVENOUS | Status: DC | PRN
  Administered 2022-09-07: 12.5 g via INTRAVENOUS

## 2022-09-07 MED ORDER — PHENYLEPHRINE HCL-NACL 20-0.9 MG/250ML-% IV SOLN: 30.0000 ug/min | INTRAVENOUS | Status: DC

## 2022-09-07 MED ORDER — HEPARIN SODIUM (PORCINE) 1000 UNIT/ML IJ SOLN
INTRAMUSCULAR | Status: DC | PRN
  Administered 2022-09-07: 30000 [IU] via INTRAVENOUS

## 2022-09-07 MED ORDER — MIDAZOLAM HCL 2 MG/2ML IJ SOLN
2.0000 mg | INTRAMUSCULAR | Status: DC | PRN
  Administered 2022-09-07 (×2): 2 mg via INTRAVENOUS
  Filled 2022-09-07 (×2): qty 2

## 2022-09-07 MED ORDER — THROMBIN 20000 UNITS EX SOLR
OROMUCOSAL | Status: DC | PRN
  Administered 2022-09-07 (×3): 4 mL via TOPICAL

## 2022-09-07 MED ORDER — ASPIRIN 81 MG PO CHEW
324.0000 mg | CHEWABLE_TABLET | Freq: Once | ORAL | Status: AC
  Administered 2022-09-07: 324 mg via ORAL
  Filled 2022-09-07: qty 4

## 2022-09-07 MED ORDER — TRAMADOL HCL 50 MG PO TABS: 50.0000 mg | ORAL_TABLET | ORAL | Status: DC | PRN

## 2022-09-07 MED ORDER — ASPIRIN 325 MG PO TBEC
325.0000 mg | DELAYED_RELEASE_TABLET | Freq: Every day | ORAL | Status: DC
  Administered 2022-09-08 – 2022-09-11 (×4): 325 mg via ORAL
  Filled 2022-09-07 (×4): qty 1

## 2022-09-07 MED ORDER — HEPARIN SODIUM (PORCINE) 1000 UNIT/ML IJ SOLN
INTRAMUSCULAR | Status: AC
  Filled 2022-09-07: qty 1

## 2022-09-07 MED ORDER — LACTATED RINGERS IV SOLN: INTRAVENOUS | Status: DC

## 2022-09-07 MED ORDER — ONDANSETRON HCL 4 MG/2ML IJ SOLN: 4.0000 mg | Freq: Four times a day (QID) | INTRAMUSCULAR | Status: DC | PRN

## 2022-09-07 MED ORDER — METOPROLOL TARTRATE 12.5 MG HALF TABLET
12.5000 mg | ORAL_TABLET | Freq: Two times a day (BID) | ORAL | Status: DC
  Administered 2022-09-07: 12.5 mg via ORAL
  Filled 2022-09-07: qty 1

## 2022-09-07 MED ORDER — HEMOSTATIC AGENTS (NO CHARGE) OPTIME
TOPICAL | Status: DC | PRN
  Administered 2022-09-07 (×2): 1 via TOPICAL

## 2022-09-07 MED ORDER — ORAL CARE MOUTH RINSE: 15.0000 mL | OROMUCOSAL | Status: DC | PRN

## 2022-09-07 MED ORDER — 0.9 % SODIUM CHLORIDE (POUR BTL) OPTIME
TOPICAL | Status: DC | PRN
  Administered 2022-09-07: 5000 mL

## 2022-09-07 MED ORDER — OXYCODONE HCL 5 MG PO TABS
5.0000 mg | ORAL_TABLET | ORAL | Status: DC | PRN
  Administered 2022-09-07: 5 mg via ORAL
  Administered 2022-09-08 (×4): 10 mg via ORAL
  Filled 2022-09-07 (×2): qty 2
  Filled 2022-09-07 (×2): qty 1
  Filled 2022-09-07 (×2): qty 2

## 2022-09-07 MED ORDER — MIDAZOLAM HCL (PF) 10 MG/2ML IJ SOLN
INTRAMUSCULAR | Status: AC
  Filled 2022-09-07: qty 2

## 2022-09-07 MED ORDER — ACETAMINOPHEN 160 MG/5ML PO SOLN: 1000.0000 mg | Freq: Four times a day (QID) | ORAL | Status: DC

## 2022-09-07 MED ORDER — POTASSIUM CHLORIDE 10 MEQ/50ML IV SOLN: 10.0000 meq | INTRAVENOUS | Status: AC

## 2022-09-07 MED ORDER — SODIUM CHLORIDE 0.9 % IV SOLN: INTRAVENOUS | Status: DC

## 2022-09-07 MED ORDER — INSULIN REGULAR(HUMAN) IN NACL 100-0.9 UT/100ML-% IV SOLN: INTRAVENOUS | Status: DC

## 2022-09-07 MED ORDER — CHLORHEXIDINE GLUCONATE 0.12 % MT SOLN
15.0000 mL | OROMUCOSAL | Status: AC
  Administered 2022-09-07: 15 mL via OROMUCOSAL
  Filled 2022-09-07: qty 15

## 2022-09-07 MED ORDER — PANTOPRAZOLE SODIUM 40 MG IV SOLR
40.0000 mg | Freq: Every day | INTRAVENOUS | Status: AC
  Administered 2022-09-07 – 2022-09-08 (×2): 40 mg via INTRAVENOUS
  Filled 2022-09-07 (×2): qty 10

## 2022-09-07 MED ORDER — CEFAZOLIN SODIUM-DEXTROSE 2-4 GM/100ML-% IV SOLN
2.0000 g | Freq: Three times a day (TID) | INTRAVENOUS | Status: AC
  Administered 2022-09-07 – 2022-09-09 (×6): 2 g via INTRAVENOUS
  Filled 2022-09-07 (×6): qty 100

## 2022-09-07 MED ORDER — MORPHINE SULFATE (PF) 2 MG/ML IV SOLN
1.0000 mg | INTRAVENOUS | Status: DC | PRN
  Administered 2022-09-07 – 2022-09-10 (×9): 2 mg via INTRAVENOUS
  Filled 2022-09-07: qty 1
  Filled 2022-09-07 (×2): qty 2
  Filled 2022-09-07 (×6): qty 1

## 2022-09-07 MED ORDER — PROPOFOL 10 MG/ML IV BOLUS
INTRAVENOUS | Status: DC | PRN
  Administered 2022-09-07: 50 mg via INTRAVENOUS

## 2022-09-07 MED ORDER — CALCIUM CHLORIDE 10 % IV SOLN
INTRAVENOUS | Status: DC | PRN
  Administered 2022-09-07 (×6): 100 mg via INTRAVENOUS

## 2022-09-07 MED ORDER — METOPROLOL TARTRATE 5 MG/5ML IV SOLN
2.5000 mg | INTRAVENOUS | Status: DC | PRN
  Administered 2022-09-07: 2.5 mg via INTRAVENOUS
  Administered 2022-09-08: 5 mg via INTRAVENOUS
  Filled 2022-09-07 (×2): qty 5

## 2022-09-07 MED ORDER — ACETAMINOPHEN 500 MG PO TABS
1000.0000 mg | ORAL_TABLET | Freq: Four times a day (QID) | ORAL | Status: DC
  Administered 2022-09-07 – 2022-09-12 (×16): 1000 mg via ORAL
  Filled 2022-09-07 (×16): qty 2

## 2022-09-07 MED ORDER — DOCUSATE SODIUM 100 MG PO CAPS
200.0000 mg | ORAL_CAPSULE | Freq: Every day | ORAL | Status: DC
  Administered 2022-09-08 – 2022-09-11 (×4): 200 mg via ORAL
  Filled 2022-09-07 (×4): qty 2

## 2022-09-07 MED ORDER — SODIUM CHLORIDE 0.9% FLUSH
3.0000 mL | Freq: Two times a day (BID) | INTRAVENOUS | Status: DC
  Administered 2022-09-08 – 2022-09-11 (×6): 3 mL via INTRAVENOUS

## 2022-09-07 MED ORDER — FENTANYL CITRATE (PF) 250 MCG/5ML IJ SOLN
INTRAMUSCULAR | Status: DC | PRN
  Administered 2022-09-07: 50 ug via INTRAVENOUS
  Administered 2022-09-07: 100 ug via INTRAVENOUS
  Administered 2022-09-07: 200 ug via INTRAVENOUS
  Administered 2022-09-07: 100 ug via INTRAVENOUS
  Administered 2022-09-07: 50 ug via INTRAVENOUS
  Administered 2022-09-07 (×3): 100 ug via INTRAVENOUS
  Administered 2022-09-07: 50 ug via INTRAVENOUS
  Administered 2022-09-07 (×4): 100 ug via INTRAVENOUS

## 2022-09-07 MED ORDER — LIDOCAINE 2% (20 MG/ML) 5 ML SYRINGE
INTRAMUSCULAR | Status: AC
  Filled 2022-09-07: qty 5

## 2022-09-07 MED ORDER — PHENYLEPHRINE 80 MCG/ML (10ML) SYRINGE FOR IV PUSH (FOR BLOOD PRESSURE SUPPORT)
PREFILLED_SYRINGE | INTRAVENOUS | Status: DC | PRN
  Administered 2022-09-07 (×9): 80 ug via INTRAVENOUS

## 2022-09-07 MED ORDER — PROTAMINE SULFATE 10 MG/ML IV SOLN
INTRAVENOUS | Status: AC
  Filled 2022-09-07: qty 25

## 2022-09-07 MED ORDER — PROTAMINE SULFATE 10 MG/ML IV SOLN
INTRAVENOUS | Status: AC
  Filled 2022-09-07: qty 5

## 2022-09-07 MED ORDER — LACTATED RINGERS IV SOLN: INTRAVENOUS | Status: DC | PRN

## 2022-09-07 MED ORDER — EPHEDRINE 5 MG/ML INJ
INTRAVENOUS | Status: AC
  Filled 2022-09-07: qty 10

## 2022-09-07 MED ORDER — MAGNESIUM SULFATE 4 GM/100ML IV SOLN
4.0000 g | Freq: Once | INTRAVENOUS | Status: AC
  Administered 2022-09-07: 4 g via INTRAVENOUS
  Filled 2022-09-07: qty 100

## 2022-09-07 MED ORDER — ORAL CARE MOUTH RINSE
15.0000 mL | OROMUCOSAL | Status: DC
  Administered 2022-09-07: 15 mL via OROMUCOSAL

## 2022-09-07 MED ORDER — ASPIRIN 81 MG PO CHEW: 324.0000 mg | CHEWABLE_TABLET | Freq: Every day | ORAL | Status: DC

## 2022-09-07 MED ORDER — ROCURONIUM BROMIDE 10 MG/ML (PF) SYRINGE
PREFILLED_SYRINGE | INTRAVENOUS | Status: DC | PRN
  Administered 2022-09-07: 100 mg via INTRAVENOUS
  Administered 2022-09-07: 50 mg via INTRAVENOUS

## 2022-09-07 MED ORDER — CHLORHEXIDINE GLUCONATE CLOTH 2 % EX PADS
6.0000 | MEDICATED_PAD | Freq: Every day | CUTANEOUS | Status: DC
  Administered 2022-09-07 – 2022-09-11 (×6): 6 via TOPICAL

## 2022-09-07 MED ORDER — DEXMEDETOMIDINE HCL IN NACL 400 MCG/100ML IV SOLN
0.0000 ug/kg/h | INTRAVENOUS | Status: DC
  Administered 2022-09-07: 0.3 ug/kg/h via INTRAVENOUS
  Filled 2022-09-07: qty 100

## 2022-09-07 MED ORDER — PANTOPRAZOLE SODIUM 40 MG PO TBEC
40.0000 mg | DELAYED_RELEASE_TABLET | Freq: Every day | ORAL | Status: DC
  Administered 2022-09-09 – 2022-09-12 (×4): 40 mg via ORAL
  Filled 2022-09-07 (×4): qty 1

## 2022-09-07 MED ORDER — SODIUM CHLORIDE 0.9 % IV SOLN: 250.0000 mL | INTRAVENOUS | Status: DC

## 2022-09-07 MED ORDER — ROCURONIUM BROMIDE 10 MG/ML (PF) SYRINGE
PREFILLED_SYRINGE | INTRAVENOUS | Status: AC
  Filled 2022-09-07: qty 20

## 2022-09-07 MED ORDER — PROTAMINE SULFATE 10 MG/ML IV SOLN
INTRAVENOUS | Status: DC | PRN
  Administered 2022-09-07: 10 mg via INTRAVENOUS
  Administered 2022-09-07: 290 mg via INTRAVENOUS

## 2022-09-07 MED ORDER — BISACODYL 5 MG PO TBEC
10.0000 mg | DELAYED_RELEASE_TABLET | Freq: Every day | ORAL | Status: DC
  Administered 2022-09-08 – 2022-09-11 (×4): 10 mg via ORAL
  Filled 2022-09-07 (×4): qty 2

## 2022-09-07 MED ORDER — SODIUM CHLORIDE 0.45 % IV SOLN: INTRAVENOUS | Status: DC | PRN

## 2022-09-07 MED ORDER — DEXTROSE 50 % IV SOLN: 0.0000 mL | INTRAVENOUS | Status: DC | PRN

## 2022-09-07 MED ORDER — VANCOMYCIN HCL IN DEXTROSE 1-5 GM/200ML-% IV SOLN
1000.0000 mg | Freq: Once | INTRAVENOUS | Status: AC
  Administered 2022-09-07: 1000 mg via INTRAVENOUS
  Filled 2022-09-07: qty 200

## 2022-09-07 SURGICAL SUPPLY — 105 items
BAG DECANTER FOR FLEXI CONT (MISCELLANEOUS) ×2
BLADE CLIPPER SURG (BLADE) ×2
BLADE STERNUM SYSTEM 6 (BLADE) ×2
BNDG CMPR MED 10X6 ELC LF (GAUZE/BANDAGES/DRESSINGS) ×2
BNDG CMPR STD VLCR NS LF 5.8X4 (GAUZE/BANDAGES/DRESSINGS) ×2
BNDG ELASTIC 4X5.8 VLCR NS LF (GAUZE/BANDAGES/DRESSINGS) ×2
BNDG ELASTIC 4X5.8 VLCR STR LF (GAUZE/BANDAGES/DRESSINGS) ×2
BNDG ELASTIC 6X10 VLCR STRL LF (GAUZE/BANDAGES/DRESSINGS) ×2
BNDG ELASTIC 6X5.8 VLCR STR LF (GAUZE/BANDAGES/DRESSINGS) ×2
BNDG GAUZE DERMACEA FLUFF 4 (GAUZE/BANDAGES/DRESSINGS) ×2
BNDG GZE DERMACEA 4 6PLY (GAUZE/BANDAGES/DRESSINGS) ×2
CANISTER SUCT 3000ML PPV (MISCELLANEOUS) ×2
CATH ROBINSON RED A/P 18FR (CATHETERS) ×4
CATH THORACIC 28FR (CATHETERS) ×2
CATH THORACIC 36FR (CATHETERS) ×2
CATH THORACIC 36FR RT ANG (CATHETERS) ×2
CLIP TI MEDIUM 24 (CLIP)
CLIP TI WIDE RED SMALL 24 (CLIP)
CONTAINER PROTECT SURGISLUSH (MISCELLANEOUS) ×4
DRAPE CARDIOVASCULAR INCISE (DRAPES) ×2
DRAPE SRG 135X102X78XABS (DRAPES) ×2
DRAPE WARM FLUID 44X44 (DRAPES) ×2
DRSG COVADERM 4X14 (GAUZE/BANDAGES/DRESSINGS) ×2
ELECT CAUTERY BLADE 6.4 (BLADE) ×2
ELECT REM PT RETURN 9FT ADLT (ELECTROSURGICAL) ×4
FELT TEFLON 1X6 (MISCELLANEOUS) ×2
GAUZE 4X4 16PLY ~~LOC~~+RFID DBL (SPONGE)
GAUZE SPONGE 4X4 12PLY STRL (GAUZE/BANDAGES/DRESSINGS) ×4
GLOVE BIO SURGEON STRL SZ 6 (GLOVE)
GLOVE BIO SURGEON STRL SZ 6.5 (GLOVE) ×8
GLOVE BIO SURGEON STRL SZ7 (GLOVE)
GLOVE BIO SURGEON STRL SZ7.5 (GLOVE)
GLOVE BIOGEL PI IND STRL 6 (GLOVE)
GLOVE BIOGEL PI IND STRL 6.5 (GLOVE) ×8
GLOVE BIOGEL PI IND STRL 7.0 (GLOVE)
GLOVE ECLIPSE 7.0 STRL STRAW (GLOVE) ×4
GLOVE ORTHO TXT STRL SZ7.5 (GLOVE)
GLOVE SS BIOGEL STRL SZ 6 (GLOVE) ×4
GOWN STRL REUS W/ TWL LRG LVL3 (GOWN DISPOSABLE) ×8
GOWN STRL REUS W/ TWL XL LVL3 (GOWN DISPOSABLE) ×2
GOWN STRL REUS W/TWL LRG LVL3 (GOWN DISPOSABLE) ×8
GOWN STRL REUS W/TWL XL LVL3 (GOWN DISPOSABLE) ×2
HEMOSTAT POWDER SURGIFOAM 1G (HEMOSTASIS) ×6
HEMOSTAT SURGICEL 2X14 (HEMOSTASIS) ×2
INSERT FOGARTY 61MM (MISCELLANEOUS)
INSERT FOGARTY XLG (MISCELLANEOUS)
KIT BASIN OR (CUSTOM PROCEDURE TRAY) ×2
KIT SUCTION CATH 14FR (SUCTIONS) ×2
KIT TURNOVER KIT B (KITS) ×2
KIT VASOVIEW HEMOPRO 2 VH 4000 (KITS) ×2
KNIFE MICRO-UNI 3.5 30 DEG (BLADE) ×2
NS IRRIG 1000ML POUR BTL (IV SOLUTION) ×10
PACK E OPEN HEART (SUTURE) ×2
PACK OPEN HEART (CUSTOM PROCEDURE TRAY) ×2
PAD ARMBOARD 7.5X6 YLW CONV (MISCELLANEOUS) ×4
PAD ELECT DEFIB RADIOL ZOLL (MISCELLANEOUS) ×2
PENCIL BUTTON HOLSTER BLD 10FT (ELECTRODE) ×2
POSITIONER HEAD DONUT 9IN (MISCELLANEOUS) ×2
PUNCH AORTIC ROTATE 4.0MM (MISCELLANEOUS)
PUNCH AORTIC ROTATE 4.5MM 8IN (MISCELLANEOUS) ×2
PUNCH AORTIC ROTATE 5MM 8IN (MISCELLANEOUS)
SEALANT SURG COSEAL 8ML (VASCULAR PRODUCTS) ×2
SET MPS 3-ND DEL (MISCELLANEOUS) ×2
SPONGE INTESTINAL PEANUT (DISPOSABLE)
SPONGE T-LAP 18X18 ~~LOC~~+RFID (SPONGE) ×4
SPONGE T-LAP 4X18 ~~LOC~~+RFID (SPONGE) ×2
SUPPORT HEART JANKE-BARRON (MISCELLANEOUS) ×2
SUT BONE WAX W31G (SUTURE) ×2
SUT MNCRL AB 4-0 PS2 18 (SUTURE)
SUT PROLENE 3 0 SH DA (SUTURE)
SUT PROLENE 3 0 SH1 36 (SUTURE) ×2
SUT PROLENE 4 0 RB 1 (SUTURE) ×4
SUT PROLENE 4 0 SH DA (SUTURE)
SUT PROLENE 4-0 RB1 .5 CRCL 36 (SUTURE) ×4
SUT PROLENE 5 0 C 1 36 (SUTURE) ×2
SUT PROLENE 6 0 C 1 30 (SUTURE) ×6
SUT PROLENE 7 0 BV 1 (SUTURE) ×2
SUT PROLENE 7 0 BV1 MDA (SUTURE) ×2
SUT PROLENE 8 0 BV175 6 (SUTURE)
SUT SILK 1 MH (SUTURE)
SUT SILK 2 0 SH (SUTURE)
SUT STEEL STERNAL CCS#1 18IN (SUTURE)
SUT STEEL SZ 6 DBL 3X14 BALL (SUTURE)
SUT TEM PAC WIRE 2 0 SH (SUTURE) ×2
SUT VIC AB 1 CTX 36 (SUTURE) ×4
SUT VIC AB 1 CTX36XBRD ANBCTR (SUTURE) ×4
SUT VIC AB 2-0 CT1 27 (SUTURE)
SUT VIC AB 2-0 CT1 TAPERPNT 27 (SUTURE)
SUT VIC AB 2-0 CTX 27 (SUTURE)
SUT VIC AB 3-0 SH 27 (SUTURE)
SUT VIC AB 3-0 SH 27X BRD (SUTURE)
SUT VIC AB 3-0 X1 27 (SUTURE)
SUT VICRYL 4-0 PS2 18IN ABS (SUTURE)
SYSTEM SAHARA CHEST DRAIN ATS (WOUND CARE) ×2
TAPE CLOTH SURG 4X10 WHT LF (GAUZE/BANDAGES/DRESSINGS) ×4
TAPE PAPER 2X10 WHT MICROPORE (GAUZE/BANDAGES/DRESSINGS) ×2
TOWEL GREEN STERILE (TOWEL DISPOSABLE) ×2
TOWEL GREEN STERILE FF (TOWEL DISPOSABLE) ×2
TRAY FOLEY SLVR 16FR TEMP STAT (SET/KITS/TRAYS/PACK) ×2
TUBE CONNECTING 12X1/4 (SUCTIONS) ×2
TUBE SUCTION CARDIAC 10FR (CANNULA) ×2
TUBING LAP HI FLOW INSUFFLATIO (TUBING) ×2
UNDERPAD 30X36 HEAVY ABSORB (UNDERPADS AND DIAPERS) ×2
WATER STERILE IRR 1000ML POUR (IV SOLUTION) ×4
YANKAUER SUCT BULB TIP NO VENT (SUCTIONS) ×2

## 2022-09-07 NOTE — Interval H&P Note (Signed)
History and Physical Interval Note:  09/07/2022 7:57 AM  Rodney Hall.  has presented today for surgery, with the diagnosis of cad.  The various methods of treatment have been discussed with the patient and family. After consideration of risks, benefits and other options for treatment, the patient has consented to  Procedure(s): CORONARY ARTERY BYPASS GRAFTING (CABG) (N/A) TRANSESOPHAGEAL ECHOCARDIOGRAM (N/A) as a surgical intervention.  The patient's history has been reviewed, patient examined, no change in status, stable for surgery.  I have reviewed the patient's chart and labs.  Questions were answered to the patient's satisfaction.     [name]

## 2022-09-07 NOTE — Progress Notes (Signed)
Attempted Echocardiogram, patient went to surgery. 

## 2022-09-07 NOTE — Anesthesia Procedure Notes (Signed)
Arterial Line Insertion Start/End7/08/2022 9:39 AM Performed by: Gwenyth Allegra, CRNA, CRNA  Preanesthetic checklist: IV checked, site marked, risks and benefits discussed, surgical consent and pre-op evaluation radial was placed Maximum sterile barriers used   Attempts: 1 Procedure performed without using ultrasound guided technique. Following insertion, dressing applied and Biopatch. Post procedure assessment: normal  Patient tolerated the procedure well with no immediate complications.

## 2022-09-07 NOTE — Anesthesia Procedure Notes (Signed)
Procedure Name: Intubation Date/Time: 09/07/2022 9:35 AM  Performed by: Dairl Ponder, CRNAPre-anesthesia Checklist: Patient identified, Emergency Drugs available, Suction available and Patient being monitored Patient Re-evaluated:Patient Re-evaluated prior to induction Oxygen Delivery Method: Circle System Utilized Preoxygenation: Pre-oxygenation with 100% oxygen Induction Type: IV induction Ventilation: Mask ventilation without difficulty Laryngoscope Size: Mac and 4 Grade View: Grade I Tube type: Oral Tube size: 8.0 mm Number of attempts: 1 Airway Equipment and Method: Stylet and Oral airway Placement Confirmation: ETT inserted through vocal cords under direct vision, positive ETCO2 and breath sounds checked- equal and bilateral Secured at: 24 cm Tube secured with: Tape Dental Injury: Teeth and Oropharynx as per pre-operative assessment

## 2022-09-07 NOTE — Anesthesia Postprocedure Evaluation (Signed)
Anesthesia Post Note  Patient: Rodney Hall.  Procedure(s) Performed: CORONARY ARTERY BYPASS GRAFTING (CABG) TIMES FIVE, USING LEFT INTERNAL MAMMARY ARTERY AND RIGHT GREATER SAPHENOUS VEIN HARVESTED ENDOSCOPICALLY (Chest) TRANSESOPHAGEAL ECHOCARDIOGRAM     Patient location during evaluation: ICU Anesthesia Type: General Level of consciousness: sedated Pain management: pain level controlled Vital Signs Assessment: post-procedure vital signs reviewed and stable Respiratory status: patient remains intubated per anesthesia plan Cardiovascular status: stable Postop Assessment: no apparent nausea or vomiting Anesthetic complications: no   No notable events documented.  Last Vitals:  Vitals:   09/07/22 1620 09/07/22 1635  BP:    Pulse: 80 80  Resp: 16 16  Temp: 37 C 37 C  SpO2: 100% 100%    Last Pain:  Vitals:   09/07/22 1520  TempSrc: Core  PainSc:                  Rodney Hall

## 2022-09-07 NOTE — Brief Op Note (Signed)
09/04/2022 - 09/07/2022  9:22 AM  PATIENT:  Rodney Hall.  77 y.o. male  PRE-OPERATIVE DIAGNOSIS:  CORONARY ARTERY DISEASE  POST-OPERATIVE DIAGNOSIS:  CORONARY ARTERY DISEASE  PROCEDURE:  Procedure(s):  CORONARY ARTERY BYPASS GRAFTING x 5 -LIMA to LAD -SVG to OM 2 -SVG to DIAGONAL -SEQ SVG to PDA and PL  ENDOSCOPIC HARVEST GREATER SAPHENOUS VEIN -Right Leg Vein harvest time: 30 min Vein prep time: 17 min  SURGEON:  Surgeon(s) and Role:    * Bartle, Payton Doughty, MD - Primary  PHYSICIAN ASSISTANT: Lowella Dandy PA-C   ASSISTANTS: Kristie Cowman RNFA   ANESTHESIA:   general  EBL:  1340 mL   BLOOD ADMINISTERED:   CC CELLSAVER  DRAINS:  Left Pleural Chest Tube, Mediastinal Chest Drains    LOCAL MEDICATIONS USED:  NONE  SPECIMEN:  No Specimen  DISPOSITION OF SPECIMEN:  N/A  COUNTS:  YES  TOURNIQUET:  * No tourniquets in log *  DICTATION: .Dragon Dictation  PLAN OF CARE: Admit to inpatient   PATIENT DISPOSITION:  ICU - intubated and hemodynamically stable.   Delay start of Pharmacological VTE agent (>24hrs) due to surgical blood loss or risk of bleeding: yes

## 2022-09-07 NOTE — Transfer of Care (Signed)
Immediate Anesthesia Transfer of Care Note  Patient: Rodney Hall.  Procedure(s) Performed: CORONARY ARTERY BYPASS GRAFTING (CABG) TIMES FIVE, USING LEFT INTERNAL MAMMARY ARTERY AND RIGHT GREATER SAPHENOUS VEIN HARVESTED ENDOSCOPICALLY (Chest) TRANSESOPHAGEAL ECHOCARDIOGRAM  Patient Location: ICU  Anesthesia Type:General  Level of Consciousness: Patient remains intubated per anesthesia plan  Airway & Oxygen Therapy: Patient remains intubated per anesthesia plan and Patient placed on Ventilator (see vital sign flow sheet for setting)  Post-op Assessment: Report given to RN and Post -op Vital signs reviewed and stable  Post vital signs: Reviewed and stable  Last Vitals:  Vitals Value Taken Time  BP    Temp    Pulse 80 09/07/22 1516  Resp 16 09/07/22 1516  SpO2 97 % 09/07/22 1516  Vitals shown include unvalidated device data.  Last Pain:  Vitals:   09/07/22 0900  TempSrc:   PainSc: 0-No pain      Patients Stated Pain Goal: 0 (09/07/22 0340)  Complications: No notable events documented.

## 2022-09-07 NOTE — Op Note (Signed)
CARDIOVASCULAR SURGERY OPERATIVE NOTE  09/07/2022  Surgeon:  [name], MD  First Assistant: Lowella Dandy,  PA-C:   An experienced assistant was required given the complexity of this surgery and the standard of surgical care. The assistant was needed for endoscopic vein harvest, exposure, dissection, suctioning, retraction of delicate tissues and sutures, instrument exchange and for overall help during this procedure.   Preoperative Diagnosis:  Severe multi-vessel coronary artery disease   Postoperative Diagnosis:  Same   Procedure:  Median Sternotomy Extracorporeal circulation 3.   Coronary artery bypass grafting x 5  Left internal mammary artery graft to the LAD SVG to diagonal SVG to OM2 Sequential SVG to PDA and PL (RCA) 4.   Endoscopic vein harvest from the right leg   Anesthesia:  General Endotracheal   Clinical History/Surgical Indication:  The patient is a 77 year old gentleman with severe multivessel CAD with NSTEMI, recurrent angina requiring IABP.  I discussed the operative procedure with the patient and his wife including alternatives, benefits and risks; including but not limited to bleeding, blood transfusion, infection, stroke, myocardial infarction, graft failure, heart block requiring a permanent pacemaker, organ dysfunction, and death.  Rodney Hall. understands and agrees to proceed.   Preparation:  The patient was taken directly to the OR from the ICU with the IABP in.  The consent was signed by me. Preoperative antibiotics were given. The patient was taken back to the operating room and positioned supine on the operating room table. After being placed under general endotracheal anesthesia by the anesthesia team a foley catheter was placed. A pulmonary arterial line and radial arterial line were placed by the anesthesia team. The neck, chest, abdomen, and  both legs were prepped with betadine soap and solution and draped in the usual sterile manner. A surgical time-out was taken and the correct patient and operative procedure were confirmed with the nursing and anesthesia staff.   Cardiopulmonary Bypass:  A median sternotomy was performed. The pericardium was opened in the midline. Right ventricular function appeared normal. The ascending aorta was of normal size and had no palpable plaque. There were no contraindications to aortic cannulation or cross-clamping. There was evidence of previous pericarditis with extensive adhesions between the LV and pericardium. These were divided sharply. The patient was fully systemically heparinized and the ACT was maintained > 400 sec. The proximal aortic arch was cannulated with a 20 F aortic cannula for arterial inflow. Venous cannulation was performed via the right atrial appendage using a two-staged venous cannula. An antegrade cardioplegia/vent cannula was inserted into the mid-ascending aorta. Aortic occlusion was performed with a single cross-clamp. Systemic cooling to 32 degrees Centigrade and topical cooling of the heart with iced saline were used. Hyperkalemic antegrade cold blood cardioplegia was used to induce diastolic arrest and was then given at about 20 minute intervals throughout the period of arrest to maintain myocardial temperature at or below 10 degrees centigrade. A temperature probe was inserted into the interventricular septum and an insulating pad was placed in the pericardium.   Left internal mammary artery harvest:  The left side of the sternum was retracted using the Rultract retractor. The left internal mammary artery was harvested as a pedicle graft. All side branches were clipped. It was a medium-sized vessel of good quality with excellent blood flow. It was ligated distally and divided. It was sprayed with topical papaverine solution to prevent vasospasm.   Endoscopic vein harvest:  Performed by Lowella Dandy, PA-C  The right  greater saphenous vein was harvested endoscopically through a 2 cm incision medial to the right knee. It was harvested from the upper thigh to below the knee. It was a medium-sized vein of good quality. The side branches were all ligated with 4-0 silk ties.    Coronary arteries:  The coronary arteries were examined.  LAD:  medium caliber vessel with diffuse disease. The diagonal was a medium caliber vessel with diffuse disease. LCX:  OM2 was large with minimal distal disease.  RCA:  PDA was a medium caliber vessel with diffuse disease. The PL was medium caliber proximally and then decreased to small size in the mid portion. There was no distal disease.    Grafts: I performed all coronary bypasses assisted by Lowella Dandy, PA-C.  LIMA to the LAD: 1.6 mm. It was sewn end to side using 8-0 prolene continuous suture. SVG to diagonal:  1.75 mm. It was sewn end to side using 7-0 prolene continuous suture. SVG to OM2:  1.75 mm. It was sewn end to side using 7-0 prolene continuous suture. Sequential SVG to PDA:  1.6 mm. It was sewn sequential side to side using 7-0 prolene continuous suture. Sequential SVG to PL:  1.6 mm. It was sewn sequential end to side using 7-0 prolene continuous suture  The proximal vein graft anastomoses were performed to the mid-ascending aorta using continuous 6-0 prolene suture. Graft markers were placed around the proximal anastomoses.   Completion:  The patient was rewarmed to 37 degrees Centigrade. The clamp was removed from the LIMA pedicle and there was rapid warming of the septum and return of ventricular fibrillation. The crossclamp was removed with a time of 113 minutes. There was spontaneous return of sinus rhythm. The distal and proximal anastomoses were checked for hemostasis. The position of the grafts was satisfactory. Two temporary epicardial pacing wires were placed on the right atrium and two on the right  ventricle. The patient was weaned from CPB without difficulty on no inotropes with the IABP on 1:1. CPB time was 136 minutes. Cardiac output was 5 LPM. TEE showed good LV systolic function, trivial MR. Heparin was fully reversed with protamine and the aortic and venous cannulas removed. Hemostasis was achieved. Mediastinal and left pleural drainage tubes were placed. The sternum was closed with  #6 stainless steel wires. The fascia was closed with continuous # 1 vicryl suture. The subcutaneous tissue was closed with 2-0 vicryl continuous suture. The skin was closed with 3-0 vicryl subcuticular suture. All sponge, needle, and instrument counts were reported correct at the end of the case. Dry sterile dressings were placed over the incisions and around the chest tubes which were connected to pleurevac suction. The patient was then transported to the surgical intensive care unit in stable condition.

## 2022-09-07 NOTE — Anesthesia Procedure Notes (Signed)
Central Venous Catheter Insertion Performed by: Leonides Grills, MD, anesthesiologist Start/End7/08/2022 9:20 AM, 09/07/2022 9:30 AM Patient location: OR. Preanesthetic checklist: patient identified, IV checked, site marked, risks and benefits discussed, surgical consent, monitors and equipment checked, pre-op evaluation, timeout performed and anesthesia consent Position: Trendelenburg Patient sedated Hand hygiene performed , maximum sterile barriers used  and Seldinger technique used Catheter size: 9 Fr Total catheter length 12. Central line was placed.MAC introducer Swan type:thermodilution Procedure performed using ultrasound guided technique. Ultrasound Notes:anatomy identified, needle tip was noted to be adjacent to the nerve/plexus identified, no ultrasound evidence of intravascular and/or intraneural injection and image(s) printed for medical record Attempts: 1 Following insertion, line sutured, dressing applied and Biopatch. Post procedure assessment: blood return through all ports, free fluid flow and no air  Patient tolerated the procedure well with no immediate complications.

## 2022-09-07 NOTE — Hospital Course (Addendum)
 History of Present Illness:  Rodney Hall is a 77 year old gentleman with a past history notable for stage III chronic kidney disease, benign prostatic hyperplasia, and dyslipidemia.  He developed chest pain while at home last evening while preparing his grill.  He describes it as a deep pressure rated at 9/10.  The pain had improved by the time he arrived to the [facility] ED.  Initial EKG showed sinus rhythm with a left fascicular block and suspected Q waves in the inferior leads.  Chest x-ray showed no acute cardiopulmonary disease.  Initial high-sensitivity troponin was 11 but when repeated later was 599.  After being receiving sublingual nitroglycerin  and being started on a nitroglycerin   infusion, he has had no further chest pain.  He was admitted with diagnosis of acute non-ST elevation myocardial infarction.  He was treated with standard protocols and underwent left heart catheterization by Dr. Mady.  This demonstrated severe three-vessel coronary artery disease with 70% and 90% tandem mid to distal LAD stenoses, 70% mid circumflex stenosis, and 80 to 90% posterior descending coronary stenosis.  His ejection fraction by left ventriculogram was estimated at 30 to 35% with apical dyskinesis.  An echocardiogram has been ordered but not yet performed.  Cardiothoracic surgery was asked to evaluate Rodney Hall for consideration of coronary bypass grafting for severe three-vessel coronary artery disease after presenting with acute non-ST elevation myocardial infarction and depressed LV function.  Hospital Course:   He was evaluated by Dr. Kerrin for bypass surgery.  He reports he has not had any further chest pain since arrival to the emergency room.  He lives a fairly active lifestyle and pays attention to his health.  He rides his bicycle for an hour 6 days a week and had not experienced any cardiac symptoms until the event last night.  He has been careful with his diet over  the last year and has been able to shift his kidney disease from stage IIIb back to stage IIIa.  He sees a education officer, community every 6 months and has no dental issues currently.  He has never used tobacco products.  He was felt to be a candidate for bypass surgery and he was agreeable to proceed.  Unfortunately the patient developed complaints of chest pain the evening of 09/05/2022.  He required return to the catheterization lab with placement of an Intra Aortic Balloon pump to stabilize symptoms.  He denied further chest pain.  However it was felt his surgery should be performed sooner.  Dr. Lucas took the patient to the operating room on 09/07/2022.  He underwent CABG x 5 utilizing LIMA to LAD, SVG to OM 2, SVG to Diagonal, and sequential SVG to PDA and PL.  He also underwent endoscopic harvest greater saphenous vein from the right leg.  He tolerated the procedure without difficulty and was taken to the SICU in stable condition.  The patient was extubated the evening of surgery.  His IABP was removed without difficulty on POD #1.  Vascular pulses remained intact post removal.  His post operative EKG showed a bifascular block and due to this beta blockade was not initiated.  He developed a low grade temperature.  Intraoperative he was noted to have evidence of pericarditis, due to this he was started on colchicine .  His chest tubes, arterial lines, and swan ganz catheter were removed without difficulty.  He developed Atrial Fibrillation with RVR.  He was treated with IV Amiodarone  bolus and drip.  He was started on  diuretics for volume overloaded state.  The patient developed sundowning with agitation/confusion and pulling at lines.  He was started on Precedex  which resulted in subsequent hypotension and had to be discontinued.  He developed an AKI in setting of CKD Stage 3a felt to be due to diuretics.  The patient's confusion agitation improved without further issue.  He was started on low dose Lopressor  and was maintaining  NSR with rates in the 90-100s.  His pacing wires were removed without difficulty.  He will be started on Plavix  prior to discharge.  He was transferred to the progressive care unit on 09/11/2022.  The patient remains in NSR.  He is tolerating the addition of Lopressor  without difficulty.  He is ambulating independently.  His surgical incisions are healing without evidence of infection.  He is medically stable for discharge home today.

## 2022-09-07 NOTE — Progress Notes (Signed)
Progress Note  Patient Name: Rodney Hall. Date of Encounter: 09/07/2022  Primary Cardiologist: Julien Nordmann, MD  Interval Summary   No chest pain or shortness of breath at rest.  Has done well since IABP placement.  Pending CABG this morning with Dr. Laneta Simmers.  Vital Signs    Vitals:   09/07/22 0500 09/07/22 0600 09/07/22 0603 09/07/22 0700  BP: (!) 109/56 (!) 117/41 (!) 109/43 (!) 113/55  Pulse: 69 69 69 77  Resp: (!) 21 (!) 22 18 (!) 23  Temp:      TempSrc:      SpO2: 96% 98% 97% 97%  Weight:      Height:        Intake/Output Summary (Last 24 hours) at 09/07/2022 0736 Last data filed at 09/07/2022 0700 Gross per 24 hour  Intake 1046.45 ml  Output 700 ml  Net 346.45 ml   Filed Weights   09/06/22 1330  Weight: 85.5 kg    Physical Exam   GEN: No acute distress.   Neck: No JVD. Cardiac: RRR, IABP sounds. Respiratory: Nonlabored. Clear to auscultation bilaterally. GI: Soft, nontender, bowel sounds present. MS: No edema. Neuro:  Nonfocal. Psych: Alert and oriented x 3. Normal affect.  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  Telemetry reviewed showing sinus rhythm.  Labs    Chemistry Recent Labs  Lab 09/05/22 0337 09/06/22 0209 09/06/22 1621 09/07/22 0426  NA 134* 135 136 135  K 3.9 3.9 3.8 3.9  CL 103 103 104  --   CO2 22 19* 22  --   GLUCOSE 135* 128* 118*  --   BUN 13 15 15   --   CREATININE 1.21 1.49* 1.45*  --   CALCIUM 8.8* 8.7* 8.6*  --   GFRNONAA >60 48* 50*  --   ANIONGAP 9 13 10   --     Hematology Recent Labs  Lab 09/05/22 0337 09/06/22 0209 09/07/22 0426 09/07/22 0614  WBC 13.3* 12.3*  --  14.0*  RBC 4.73 4.44  --  3.97*  HGB 14.8 14.3 11.9* 12.5*  HCT 43.4 42.2 35.0* 37.3*  MCV 91.8 95.0  --  94.0  MCH 31.3 32.2  --   31.5  MCHC 34.1 33.9  --  33.5  RDW 11.9 12.0  --  11.8  PLT 216 198  --  178   Cardiac Enzymes Recent Labs  Lab 09/03/22 1852 09/03/22 2045 09/06/22 0717  TROPONINIHS 11 599* 3,272*   Lipid Panel     Component Value Date/Time   CHOL 147 09/04/2022 0300   CHOL 181 02/22/2022 1056   TRIG 177 (H) 09/04/2022 0300   HDL 32 (L) 09/04/2022 0300   HDL 34 (L) 02/22/2022 1056   CHOLHDL 4.6 09/04/2022 0300   VLDL 35 09/04/2022 0300   LDLCALC 80 09/04/2022 0300   LDLCALC 108 (H) 02/22/2022 1056   LABVLDL 39 02/22/2022 1056    Cardiac Studies   Cardiac catheterization 09/06/2022:   Mid LM to Ost LAD lesion is 30% stenosed with 30% stenosed side branch in Ost Cx.   Mid LAD-1 lesion is 90% stenosed.   Mid LAD-2 lesion is 90% stenosed.   Dist LAD lesion is 90% stenosed.   Mid Cx to Dist Cx lesion is 70% stenosed.   2nd Diag lesion is 70% stenosed.  2nd Mrg lesion is 100% stenosed.   RPDA lesion is 80% stenosed.   RPAV lesion is 90% stenosed.   LV end diastolic pressure is normal.   No change in severe 3 vessel obstructive CAD Normal LVEDP IABP placed for refractory angina  Assessment & Plan   1.  NSTEMI status post initial cardiac catheterization on July 3 showing occluded distal OM1 as culprit with otherwise multivessel CAD and plan for CABG, LVEF 30 to 35% at angiography.  Initial procedure at [facility], patient transferred to [facility] and subsequently underwent relook angiography with placement of IABP on July 5 due to refractory angina.  2.  HFrEF, ischemic cardiomyopathy with LVEF 30 to 35% at angiography, echocardiogram pending.  3.  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  4.  Mixed hyperlipidemia, LDL 80 recently and now on Lipitor 80 mg daily.  Patient for CABG this morning with Dr. Laneta Simmers.  Currently without angina on IABP.  Continues on aspirin, Lipitor, Lopressor, and IV heparin.  For questions or updates, please contact [facility] Please consult  [email or web address] for contact info under   Signed, Nona Dell, MD  09/07/2022, 7:36 AM

## 2022-09-07 NOTE — Anesthesia Preprocedure Evaluation (Addendum)
Anesthesia Evaluation  Patient identified by MRN, date of birth, ID band Patient awake    Reviewed: Allergy & Precautions, NPO status , Patient's Chart, lab work & pertinent test results  Airway Mallampati: II  TM Distance: >3 FB Neck ROM: Full    Dental no notable dental hx.    Pulmonary neg pulmonary ROS   Pulmonary exam normal        Cardiovascular + CAD and + Past MI  Normal cardiovascular exam  IABP 1:1   Neuro/Psych   Anxiety     negative neurological ROS     GI/Hepatic negative GI ROS, Neg liver ROS,,,  Endo/Other  negative endocrine ROS    Renal/GU Renal disease     Musculoskeletal  (+) Arthritis ,    Abdominal   Peds  Hematology  (+) Blood dyscrasia, anemia   Anesthesia Other Findings cad  Reproductive/Obstetrics                             Anesthesia Physical Anesthesia Plan  ASA: 4  Anesthesia Plan: General   Post-op Pain Management:    Induction: Intravenous  PONV Risk Score and Plan: 2 and Ondansetron, Dexamethasone, Midazolam and Treatment may vary due to age or medical condition  Airway Management Planned: Oral ETT  Additional Equipment: Arterial line, CVP, PA Cath, TEE and Ultrasound Guidance Line Placement  Intra-op Plan:   Post-operative Plan: Post-operative intubation/ventilation  Informed Consent: I have reviewed the patients History and Physical, chart, labs and discussed the procedure including the risks, benefits and alternatives for the proposed anesthesia with the patient or authorized representative who has indicated his/her understanding and acceptance.     Dental advisory given  Plan Discussed with: CRNA  Anesthesia Plan Comments:         Anesthesia Quick Evaluation

## 2022-09-07 NOTE — Procedures (Signed)
Extubation Procedure Note  Patient Details:   Name: Rodney Hall. DOB: 1945/05/06 MRN: [id number]   Airway Documentation:  Airway 8 mm (Active)  Secured at (cm) 24 cm 09/07/22 1520  Measured From Lips 09/07/22 1500  Secured Location Right 09/07/22 1500  Secured By [employer] Tape 09/07/22 1500  Prone position No 09/07/22 1500  Cuff Pressure (cm H2O) Clear OR 27-39 CmH2O 09/07/22 1500  Site Condition Dry 09/07/22 1500   Vent end date: 09/07/22 Vent end time: 2118  VC 1.4L NIF -30 Leak around deflated cuff noted and audible Good strong pt cough  Evaluation  O2 sats: stable throughout Complications: No apparent complications Patient did tolerate procedure well. Bilateral Breath Sounds: Clear, Diminished   Yes  Rutha Bouchard 09/07/2022, 9:26 PM

## 2022-09-07 NOTE — Anesthesia Procedure Notes (Signed)
Central Venous Catheter Insertion Performed by: Leonides Grills, MD, anesthesiologist Start/End7/08/2022 9:30 AM, 09/07/2022 9:40 AM Patient location: OR. Preanesthetic checklist: patient identified, IV checked, site marked, risks and benefits discussed, surgical consent, monitors and equipment checked, pre-op evaluation, timeout performed and anesthesia consent Position: supine Hand hygiene performed  and maximum sterile barriers used  PA cath was placed.Swan type:thermodilution PA Cath depth:48 Procedure performed without using ultrasound guided technique. Attempts: 1 Patient tolerated the procedure well with no immediate complications.

## 2022-09-08 ENCOUNTER — Other Ambulatory Visit: Payer: Self-pay

## 2022-09-08 ENCOUNTER — Inpatient Hospital Stay (HOSPITAL_COMMUNITY): Payer: HMO

## 2022-09-08 LAB — CBC
HCT: 32.1 % — ABNORMAL LOW (ref 39.0–52.0)
HCT: 32.9 % — ABNORMAL LOW (ref 39.0–52.0)
HCT: 36 % — ABNORMAL LOW (ref 39.0–52.0)
Hemoglobin: 10.6 g/dL — ABNORMAL LOW (ref 13.0–17.0)
Hemoglobin: 10.9 g/dL — ABNORMAL LOW (ref 13.0–17.0)
Hemoglobin: 12 g/dL — ABNORMAL LOW (ref 13.0–17.0)
MCH: 31.8 pg (ref 26.0–34.0)
MCH: 32 pg (ref 26.0–34.0)
MCH: 32.9 pg (ref 26.0–34.0)
MCHC: 33 g/dL (ref 30.0–36.0)
MCHC: 33.1 g/dL (ref 30.0–36.0)
MCHC: 33.3 g/dL (ref 30.0–36.0)
MCV: 96.4 fL (ref 80.0–100.0)
MCV: 96.5 fL (ref 80.0–100.0)
MCV: 98.6 fL (ref 80.0–100.0)
Platelets: 137 10*3/uL — ABNORMAL LOW (ref 150–400)
Platelets: 141 10*3/uL — ABNORMAL LOW (ref 150–400)
Platelets: 148 10*3/uL — ABNORMAL LOW (ref 150–400)
RBC: 3.33 MIL/uL — ABNORMAL LOW (ref 4.22–5.81)
RBC: 3.41 MIL/uL — ABNORMAL LOW (ref 4.22–5.81)
RBC: 3.65 MIL/uL — ABNORMAL LOW (ref 4.22–5.81)
RDW: 12 % (ref 11.5–15.5)
RDW: 12 % (ref 11.5–15.5)
RDW: 12.1 % (ref 11.5–15.5)
WBC: 17.5 10*3/uL — ABNORMAL HIGH (ref 4.0–10.5)
WBC: 19.1 10*3/uL — ABNORMAL HIGH (ref 4.0–10.5)
WBC: 19.4 10*3/uL — ABNORMAL HIGH (ref 4.0–10.5)
nRBC: 0 % (ref 0.0–0.2)
nRBC: 0 % (ref 0.0–0.2)
nRBC: 0 % (ref 0.0–0.2)

## 2022-09-08 LAB — GLUCOSE, CAPILLARY
Glucose-Capillary: 104 mg/dL — ABNORMAL HIGH (ref 70–99)
Glucose-Capillary: 107 mg/dL — ABNORMAL HIGH (ref 70–99)
Glucose-Capillary: 109 mg/dL — ABNORMAL HIGH (ref 70–99)
Glucose-Capillary: 122 mg/dL — ABNORMAL HIGH (ref 70–99)
Glucose-Capillary: 124 mg/dL — ABNORMAL HIGH (ref 70–99)
Glucose-Capillary: 142 mg/dL — ABNORMAL HIGH (ref 70–99)
Glucose-Capillary: 143 mg/dL — ABNORMAL HIGH (ref 70–99)
Glucose-Capillary: 86 mg/dL (ref 70–99)
Glucose-Capillary: 97 mg/dL (ref 70–99)
Glucose-Capillary: 99 mg/dL (ref 70–99)

## 2022-09-08 LAB — BASIC METABOLIC PANEL WITH GFR
Anion gap: 8 (ref 5–15)
Anion gap: 9 (ref 5–15)
Anion gap: 7 (ref 5–15)
BUN: 16 mg/dL (ref 8–23)
BUN: 16 mg/dL (ref 8–23)
BUN: 16 mg/dL (ref 8–23)
CO2: 21 mmol/L — ABNORMAL LOW (ref 22–32)
CO2: 19 mmol/L — ABNORMAL LOW (ref 22–32)
CO2: 22 mmol/L (ref 22–32)
Calcium: 7.9 mg/dL — ABNORMAL LOW (ref 8.9–10.3)
Calcium: 7.8 mg/dL — ABNORMAL LOW (ref 8.9–10.3)
Calcium: 7.9 mg/dL — ABNORMAL LOW (ref 8.9–10.3)
Chloride: 104 mmol/L (ref 98–111)
Chloride: 103 mmol/L (ref 98–111)
Chloride: 100 mmol/L (ref 98–111)
Creatinine, Ser: 1.43 mg/dL — ABNORMAL HIGH (ref 0.61–1.24)
Creatinine, Ser: 1.34 mg/dL — ABNORMAL HIGH (ref 0.61–1.24)
Creatinine, Ser: 1.26 mg/dL — ABNORMAL HIGH (ref 0.61–1.24)
GFR, Estimated: 50 mL/min — ABNORMAL LOW
GFR, Estimated: 55 mL/min — ABNORMAL LOW
GFR, Estimated: 59 mL/min — ABNORMAL LOW
Glucose, Bld: 119 mg/dL — ABNORMAL HIGH (ref 70–99)
Glucose, Bld: 111 mg/dL — ABNORMAL HIGH (ref 70–99)
Glucose, Bld: 167 mg/dL — ABNORMAL HIGH (ref 70–99)
Potassium: 4.7 mmol/L (ref 3.5–5.1)
Potassium: 4.8 mmol/L (ref 3.5–5.1)
Potassium: 4.3 mmol/L (ref 3.5–5.1)
Sodium: 133 mmol/L — ABNORMAL LOW (ref 135–145)
Sodium: 131 mmol/L — ABNORMAL LOW (ref 135–145)
Sodium: 129 mmol/L — ABNORMAL LOW (ref 135–145)

## 2022-09-08 LAB — MAGNESIUM
Magnesium: 2.8 mg/dL — ABNORMAL HIGH (ref 1.7–2.4)
Magnesium: 2.3 mg/dL (ref 1.7–2.4)

## 2022-09-08 MED ORDER — TRAMADOL HCL 50 MG PO TABS
50.0000 mg | ORAL_TABLET | ORAL | Status: DC | PRN
  Administered 2022-09-08: 50 mg via ORAL
  Filled 2022-09-08: qty 1

## 2022-09-08 MED ORDER — INSULIN ASPART 100 UNIT/ML IJ SOLN
0.0000 [IU] | INTRAMUSCULAR | Status: DC
  Administered 2022-09-08 – 2022-09-09 (×4): 2 [IU] via SUBCUTANEOUS

## 2022-09-08 MED ORDER — METOPROLOL TARTRATE 12.5 MG HALF TABLET
12.5000 mg | ORAL_TABLET | Freq: Two times a day (BID) | ORAL | Status: DC
  Administered 2022-09-08 – 2022-09-09 (×4): 12.5 mg via ORAL
  Filled 2022-09-08 (×4): qty 1

## 2022-09-08 MED ORDER — ENOXAPARIN SODIUM 40 MG/0.4ML IJ SOSY
40.0000 mg | PREFILLED_SYRINGE | Freq: Every day | INTRAMUSCULAR | Status: DC
  Administered 2022-09-08 – 2022-09-11 (×4): 40 mg via SUBCUTANEOUS
  Filled 2022-09-08 (×4): qty 0.4

## 2022-09-08 MED ORDER — COLCHICINE 0.6 MG PO TABS
0.6000 mg | ORAL_TABLET | Freq: Every day | ORAL | Status: DC
  Administered 2022-09-08 – 2022-09-11 (×4): 0.6 mg via ORAL
  Filled 2022-09-08 (×4): qty 1

## 2022-09-08 NOTE — Progress Notes (Signed)
Patient ID: Rodney Bach., male   DOB: Oct 28, 1945, 76 y.o.   MRN: [id number] TCTS Evening Rounds:  Hemodynamically stable in sinus rhythm.  Chest tubes out and feels much better.  UO ok

## 2022-09-08 NOTE — Progress Notes (Signed)
Patient ID: Rodney Bach., male   DOB: 1945-12-05, 77 y.o.   MRN: [id number] TCTS  IABP removed without difficulty. Pressure held for 20 minutes. Foot is warm with palpable dp and pt pulses. Will continue bedrest for 4 hrs.

## 2022-09-08 NOTE — Progress Notes (Signed)
1 Day Post-Op Procedure(s) (LRB): CORONARY ARTERY BYPASS GRAFTING (CABG) TIMES FIVE, USING LEFT INTERNAL MAMMARY ARTERY AND RIGHT GREATER SAPHENOUS VEIN HARVESTED ENDOSCOPICALLY (N/A) TRANSESOPHAGEAL ECHOCARDIOGRAM (N/A) Subjective: No complaints. Stable night.  Objective: Vital signs in last 24 hours: Temp:  [97.7 F (36.5 C)-101.7 F (38.7 C)] 100.4 F (38 C) (07/07 0700) Pulse Rate:  [79-147] 85 (07/07 0700) Cardiac Rhythm: Atrial paced (07/07 0519) Resp:  [13-32] 16 (07/07 0700) BP: (89-140)/(42-111) 134/53 (07/07 0600) SpO2:  [94 %-100 %] 95 % (07/07 0700) Arterial Line BP: (84-177)/(28-55) 121/35 (07/07 0700) FiO2 (%):  [40 %-50 %] 40 % (07/06 2040) Weight:  [92.9 kg] 92.9 kg (07/07 0500)  Hemodynamic parameters for last 24 hours: PAP: (25-48)/(9-30) 30/13 CO:  [3.5 L/min-4.1 L/min] 4.1 L/min CI:  [1.7 L/min/m2-2 L/min/m2] 2 L/min/m2  Intake/Output from previous day: 07/06 0701 - 07/07 0700 In: 5706.7 [I.V.:3380; Blood:670; IV Piggyback:1656.7] Out: 3665 [Urine:1920; Blood:1340; Chest Tube:405] Intake/Output this shift: No intake/output data recorded.  General appearance: alert and cooperative Neurologic: intact Heart: regular rate and rhythm, S1, S2 normal, no murmur Lungs: clear to auscultation bilaterally Extremities: edema mild Wound: dressing dry  Lab Results: Recent Labs    09/08/22 0010 09/08/22 0510  WBC 17.5* 19.4*  HGB 12.0* 10.6*  HCT 36.0* 32.1*  PLT 137* 141*   BMET:  Recent Labs    09/08/22 0010 09/08/22 0510  NA 133* 131*  K 4.7 4.8  CL 104 103  CO2 21* 19*  GLUCOSE 119* 111*  BUN 16 16  CREATININE 1.43* 1.34*  CALCIUM 7.9* 7.8*    PT/INR:  Recent Labs    09/07/22 1537  LABPROT 19.1*  INR 1.6*   ABG    Component Value Date/Time   PHART 7.345 (L) 09/07/2022 2112   HCO3 20.8 09/07/2022 2112   TCO2 22 09/07/2022 2112   ACIDBASEDEF 5.0 (H) 09/07/2022 2112   O2SAT 97 09/07/2022 2112   CBG (last 3)  Recent Labs     09/08/22 0303 09/08/22 0357 09/08/22 0506  GLUCAP 99 104* 107*   CXR: bibasilar atelectasis.  ECG: sinus, bifascicular block  Assessment/Plan: S/P Procedure(s) (LRB): CORONARY ARTERY BYPASS GRAFTING (CABG) TIMES FIVE, USING LEFT INTERNAL MAMMARY ARTERY AND RIGHT GREATER SAPHENOUS VEIN HARVESTED ENDOSCOPICALLY (N/A) TRANSESOPHAGEAL ECHOCARDIOGRAM (N/A)  POD 1 Hemodynamically stable in sinus rhythm. 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 Hold Lopressor today. IABP decreased to 1:3 and will plan to remove this morning.   After IABP out will remove swan, arterial line.  Remove chest tubes later today after able to dangle.  Stage 3a CKD. Creat stable. Hold off on diuresis today.  No hx of DM and Hgb A1c of 5.7 preop. Continue SSI today and probably be able to stop tomorrow.  Fever to 101.7 and leukocytosis most likely inflammatory early postop. He has evidence of remote pericarditis with well formed adhesions as well as acute pericarditis probably from MI. Will start colchicine.   IS, OOB later today after IABP removed.   LOS: 4 days    Rodney Hall 09/08/2022

## 2022-09-08 NOTE — Progress Notes (Signed)
Progress Note  Patient Name: Rodney Hall. Date of Encounter: 09/08/2022  Primary Cardiologist: Julien Nordmann, MD  Interval Summary   No complaints this morning.  Postop day #1 status post CABG.  IABP currently at 1:3 with plan to discontinue today.  He is in sinus rhythm and hemodynamically stable.  Vital Signs    Vitals:   09/08/22 0700 09/08/22 0715 09/08/22 0730 09/08/22 0745  BP: (!) 125/45     Pulse: 85 87 97 95  Resp: 16 19 18  (!) 27  Temp: (!) 100.4 F (38 C) (!) 100.4 F (38 C) 100.2 F (37.9 C) (!) 100.4 F (38 C)  TempSrc:      SpO2: 95% 95% 93% 94%  Weight:      Height:        Intake/Output Summary (Last 24 hours) at 09/08/2022 0756 Last data filed at 09/08/2022 0700 Gross per 24 hour  Intake 5706.65 ml  Output 3665 ml  Net 2041.65 ml   Filed Weights   09/06/22 1330 09/08/22 0500  Weight: 85.5 kg 92.9 kg    Physical Exam   GEN: No acute distress.   Neck: No JVD. Cardiac: RRR, IABP sounds 1:3, no rub Thorax: Dressed, chest tubes in place.Marland Kitchen Respiratory: Nonlabored. Clear to auscultation bilaterally. GI: Soft, nontender, bowel sounds present. MS: No edema.  ECG/Telemetry    An ECG dated 09/08/2022 was personally reviewed today and demonstrated sinus rhythm with right bundle branch block and left anterior fascicular block.  Telemetry reviewed showing sinus rhythm.  Labs    Chemistry Recent Labs  Lab 09/07/22 2111 09/07/22 2112 09/08/22 0010 09/08/22 0510  NA 133* 133* 133* 131*  K 4.5 4.6 4.7 4.8  CL 105  --  104 103  CO2 20*  --  21* 19*  GLUCOSE 136*  --  119* 111*  BUN 17  --  16 16  CREATININE 1.31*  --  1.43* 1.34*  CALCIUM 7.9*  --  7.9* 7.8*  GFRNONAA 56*  --  50* 55*  ANIONGAP 8  --  8 9    Hematology Recent Labs  Lab 09/07/22 2111 09/07/22 2112 09/08/22 0010 09/08/22 0510  WBC 14.1*  --  17.5* 19.4*  RBC 3.23*  --  3.65* 3.33*  HGB 10.4* 9.9* 12.0* 10.6*  HCT 30.9* 29.0* 36.0* 32.1*  MCV 95.7  --  98.6 96.4   MCH 32.2  --  32.9 31.8  MCHC 33.7  --  33.3 33.0  RDW 11.9  --  12.0 12.0  PLT 132*  --  137* 141*   Cardiac Enzymes Recent Labs  Lab 09/03/22 1852 09/03/22 2045 09/06/22 0717  TROPONINIHS 11 599* 3,272*   Lipid Panel     Component Value Date/Time   CHOL 147 09/04/2022 0300   CHOL 181 02/22/2022 1056   TRIG 177 (H) 09/04/2022 0300   HDL 32 (L) 09/04/2022 0300   HDL 34 (L) 02/22/2022 1056   CHOLHDL 4.6 09/04/2022 0300   VLDL 35 09/04/2022 0300   LDLCALC 80 09/04/2022 0300   LDLCALC 108 (H) 02/22/2022 1056   LABVLDL 39 02/22/2022 1056    Cardiac Studies   Cardiac catheterization 09/06/2022:   Mid LM to Ost LAD lesion is 30% stenosed with 30% stenosed side branch in Ost Cx.   Mid LAD-1 lesion is 90% stenosed.   Mid LAD-2 lesion is 90% stenosed.   Dist LAD lesion is 90% stenosed.   Mid Cx to Dist Cx lesion is 70% stenosed.  2nd Diag lesion is 70% stenosed.   2nd Mrg lesion is 100% stenosed.   RPDA lesion is 80% stenosed.   RPAV lesion is 90% stenosed.   LV end diastolic pressure is normal.   No change in severe 3 vessel obstructive CAD Normal LVEDP IABP placed for refractory angina  Assessment & Plan   1.  NSTEMI status post initial cardiac catheterization on July 3 showing occluded distal OM1 as culprit with otherwise multivessel CAD and plan for CABG, LVEF 30 to 35% at angiography.  Initial procedure at [facility], patient transferred to [facility] and subsequently underwent relook angiography with placement of IABP on July 5 due to refractory angina.  Now postop day #1 status post CABG (LIMA to LAD, SVG to diagonal, SVG to OM 2, and sequential SVG to PDA and PL) with Dr. Laneta Simmers, hemodynamically stable.  2.  HFrEF, ischemic cardiomyopathy with LVEF 30 to 35% at angiography, echocardiogram pending.  Intraoperative TEE reportedly indicating overall good LV systolic function however.  3.  CKD stage IIIa-b, creatinine 1.34.  4.  Mixed hyperlipidemia, LDL 80 recently and  now on Lipitor 80 mg daily.  Continue postoperative care per TCTS.  Anticipate DC IABP today as well as Swan.  Chest tube potentially out later today as well.  Holding on beta-blocker for now.  Obtain transthoracic echocardiogram when able.  Follow rhythm and temperature curve.  For questions or updates, please contact [facility] Please consult [email or web address] for contact info under   Signed, Nona Dell, MD  09/08/2022, 7:56 AM

## 2022-09-09 ENCOUNTER — Inpatient Hospital Stay (HOSPITAL_COMMUNITY): Payer: HMO

## 2022-09-09 ENCOUNTER — Other Ambulatory Visit: Payer: Self-pay

## 2022-09-09 ENCOUNTER — Encounter (HOSPITAL_COMMUNITY): Payer: Self-pay | Admitting: Surgery

## 2022-09-09 LAB — BASIC METABOLIC PANEL WITH GFR
Anion gap: 6 (ref 5–15)
BUN: 15 mg/dL (ref 8–23)
CO2: 22 mmol/L (ref 22–32)
Calcium: 8 mg/dL — ABNORMAL LOW (ref 8.9–10.3)
Chloride: 100 mmol/L (ref 98–111)
Creatinine, Ser: 1.18 mg/dL (ref 0.61–1.24)
GFR, Estimated: >60 mL/min
Glucose, Bld: 181 mg/dL — ABNORMAL HIGH (ref 70–99)
Potassium: 4 mmol/L (ref 3.5–5.1)
Sodium: 128 mmol/L — ABNORMAL LOW (ref 135–145)

## 2022-09-09 LAB — CBC
HCT: 32.2 % — ABNORMAL LOW (ref 39.0–52.0)
Hemoglobin: 10.8 g/dL — ABNORMAL LOW (ref 13.0–17.0)
MCH: 32 pg (ref 26.0–34.0)
MCHC: 33.5 g/dL (ref 30.0–36.0)
MCV: 95.3 fL (ref 80.0–100.0)
Platelets: 157 10*3/uL (ref 150–400)
RBC: 3.38 MIL/uL — ABNORMAL LOW (ref 4.22–5.81)
RDW: 12 % (ref 11.5–15.5)
WBC: 17.8 10*3/uL — ABNORMAL HIGH (ref 4.0–10.5)
nRBC: 0 % (ref 0.0–0.2)

## 2022-09-09 LAB — LIPOPROTEIN A (LPA): Lipoprotein (a): 19.3 nmol/L

## 2022-09-09 LAB — GLUCOSE, CAPILLARY
Glucose-Capillary: 146 mg/dL — ABNORMAL HIGH (ref 70–99)
Glucose-Capillary: 123 mg/dL — ABNORMAL HIGH (ref 70–99)
Glucose-Capillary: 111 mg/dL — ABNORMAL HIGH (ref 70–99)
Glucose-Capillary: 116 mg/dL — ABNORMAL HIGH (ref 70–99)

## 2022-09-09 MED ORDER — AMIODARONE LOAD VIA INFUSION
150.0000 mg | Freq: Once | INTRAVENOUS | Status: AC
  Administered 2022-09-09: 150 mg via INTRAVENOUS
  Filled 2022-09-09: qty 83.34

## 2022-09-09 MED ORDER — METOLAZONE 2.5 MG PO TABS
2.5000 mg | ORAL_TABLET | Freq: Once | ORAL | Status: AC
  Administered 2022-09-09: 2.5 mg via ORAL
  Filled 2022-09-09: qty 1

## 2022-09-09 MED ORDER — FUROSEMIDE 10 MG/ML IJ SOLN
40.0000 mg | Freq: Two times a day (BID) | INTRAMUSCULAR | Status: AC
  Administered 2022-09-09 (×2): 40 mg via INTRAVENOUS
  Filled 2022-09-09 (×2): qty 4

## 2022-09-09 MED ORDER — AMIODARONE HCL IN DEXTROSE 360-4.14 MG/200ML-% IV SOLN
60.0000 mg/h | INTRAVENOUS | Status: AC
  Administered 2022-09-09 (×2): 60 mg/h via INTRAVENOUS
  Filled 2022-09-09 (×2): qty 200

## 2022-09-09 MED ORDER — POTASSIUM CHLORIDE CRYS ER 20 MEQ PO TBCR
20.0000 meq | EXTENDED_RELEASE_TABLET | Freq: Three times a day (TID) | ORAL | Status: AC
  Administered 2022-09-09 (×3): 20 meq via ORAL
  Filled 2022-09-09 (×3): qty 1

## 2022-09-09 MED ORDER — ALPRAZOLAM 0.5 MG PO TABS
0.5000 mg | ORAL_TABLET | Freq: Three times a day (TID) | ORAL | Status: DC | PRN
  Administered 2022-09-09 (×2): 0.5 mg via ORAL
  Filled 2022-09-09 (×3): qty 1

## 2022-09-09 MED ORDER — AMIODARONE HCL IN DEXTROSE 360-4.14 MG/200ML-% IV SOLN
30.0000 mg/h | INTRAVENOUS | Status: DC
  Administered 2022-09-09 (×2): 30 mg/h via INTRAVENOUS
  Filled 2022-09-09 (×2): qty 200

## 2022-09-09 MED ORDER — DEXMEDETOMIDINE HCL IN NACL 400 MCG/100ML IV SOLN
0.0000 ug/kg/h | INTRAVENOUS | Status: DC
  Administered 2022-09-09: 0.4 ug/kg/h via INTRAVENOUS
  Administered 2022-09-10: 1.1 ug/kg/h via INTRAVENOUS
  Filled 2022-09-09 (×3): qty 100

## 2022-09-09 NOTE — Progress Notes (Signed)
EVENING ROUNDS NOTE :     [address]       [place] [address]             [phone]                 2 Days Post-Op Procedure(s) (LRB): CORONARY ARTERY BYPASS GRAFTING (CABG) TIMES FIVE, USING LEFT INTERNAL MAMMARY ARTERY AND RIGHT GREATER SAPHENOUS VEIN HARVESTED ENDOSCOPICALLY (N/A) TRANSESOPHAGEAL ECHOCARDIOGRAM (N/A)   Total Length of Stay:  LOS: 5 days  Events:   No events Some anxiety earlier Resting comfortably   BP (!) 109/57   Pulse 87   Temp 97.8 F (36.6 C) (Oral)   Resp (!) 27   Ht 5\' 10"  (1.778 m)   Wt 90.9 kg   SpO2 (!) 88%   BMI 28.75 kg/m          sodium chloride Stopped (09/08/22 9147)   amiodarone 30 mg/hr (09/09/22 1634)   lactated ringers 20 mL/hr at 09/09/22 1634    I/O last 3 completed shifts: In: 1496.8 [I.V.:1229.9; IV Piggyback:266.9] Out: 2610 [Urine:2470; Chest Tube:140]      Latest Ref Rng & Units 09/09/2022    4:11 AM 09/08/2022    4:32 PM 09/08/2022    5:10 AM  CBC  WBC 4.0 - 10.5 K/uL 17.8  19.1  19.4   Hemoglobin 13.0 - 17.0 g/dL 82.9  56.2  13.0   Hematocrit 39.0 - 52.0 % 32.2  32.9  32.1   Platelets 150 - 400 K/uL 157  148  141        Latest Ref Rng & Units 09/09/2022    4:11 AM 09/08/2022    4:32 PM 09/08/2022    5:10 AM  BMP  Glucose 70 - 99 mg/dL 865  784  696   BUN 8 - 23 mg/dL 15  16  16    Creatinine 0.61 - 1.24 mg/dL 2.95  2.84  1.32   Sodium 135 - 145 mmol/L 128  129  131   Potassium 3.5 - 5.1 mmol/L 4.0  4.3  4.8   Chloride 98 - 111 mmol/L 100  100  103   CO2 22 - 32 mmol/L 22  22  19    Calcium 8.9 - 10.3 mg/dL 8.0  7.9  7.8     ABG    Component Value Date/Time   PHART 7.345 (L) 09/07/2022 2112   PCO2ART 37.9 09/07/2022 2112   PO2ART 93 09/07/2022 2112   HCO3 20.8 09/07/2022 2112   TCO2 22 09/07/2022 2112   ACIDBASEDEF 5.0 (H) 09/07/2022 2112   O2SAT 97 09/07/2022 2112       Brynda Greathouse, MD 09/09/2022 7:53 PM

## 2022-09-09 NOTE — Progress Notes (Signed)
2 Days Post-Op Procedure(s) (LRB): CORONARY ARTERY BYPASS GRAFTING (CABG) TIMES FIVE, USING LEFT INTERNAL MAMMARY ARTERY AND RIGHT GREATER SAPHENOUS VEIN HARVESTED ENDOSCOPICALLY (N/A) TRANSESOPHAGEAL ECHOCARDIOGRAM (N/A) Subjective:  Foley bothering him. He has hx of interstitial cystitis. Did not sleep at all. He did ambulate around the ICU this am without difficulty and felt like he could keep going.  Went into rapid atrial fib overnight and started on IV amio. Converted.  Objective: Vital signs in last 24 hours: Temp:  [97.7 F (36.5 C)-100.4 F (38 C)] 98.1 F (36.7 C) (07/08 0400) Pulse Rate:  [78-138] 78 (07/08 0400) Cardiac Rhythm: Atrial fibrillation (07/08 0400) Resp:  [16-32] 29 (07/08 0600) BP: (72-159)/(45-103) 98/83 (07/08 0600) SpO2:  [88 %-100 %] 96 % (07/08 0600) Arterial Line BP: (108-164)/(35-67) 163/63 (07/07 1145) Weight:  [90.9 kg] 90.9 kg (07/08 0500)  Hemodynamic parameters for last 24 hours: PAP: (29-40)/(12-21) 40/18  Intake/Output from previous day: 07/07 0701 - 07/08 0700 In: 1139.7 [I.V.:872.8; IV Piggyback:266.9] Out: 1515 [Urine:1375; Chest Tube:140] Intake/Output this shift: Total I/O In: 546.1 [I.V.:379.2; IV Piggyback:166.9] Out: 685 [Urine:685]  General appearance: alert and cooperative Neurologic: intact Heart: regular rate and rhythm, S1, S2 normal, no murmur or rub  Lungs: diminished breath sounds bibasilar Extremities: edema mild Wound: dressing dry  Lab Results: Recent Labs    09/08/22 1632 09/09/22 0411  WBC 19.1* 17.8*  HGB 10.9* 10.8*  HCT 32.9* 32.2*  PLT 148* 157   BMET:  Recent Labs    09/08/22 1632 09/09/22 0411  NA 129* 128*  K 4.3 4.0  CL 100 100  CO2 22 22  GLUCOSE 167* 181*  BUN 16 15  CREATININE 1.26* 1.18  CALCIUM 7.9* 8.0*    PT/INR:  Recent Labs    09/07/22 1537  LABPROT 19.1*  INR 1.6*   ABG    Component Value Date/Time   PHART 7.345 (L) 09/07/2022 2112   HCO3 20.8 09/07/2022 2112    TCO2 22 09/07/2022 2112   ACIDBASEDEF 5.0 (H) 09/07/2022 2112   O2SAT 97 09/07/2022 2112   CBG (last 3)  Recent Labs    09/08/22 2119 09/08/22 2353 09/09/22 0345  GLUCAP 142* 143* 146*   CXR: LLL atelectasis  Assessment/Plan: S/P Procedure(s) (LRB): CORONARY ARTERY BYPASS GRAFTING (CABG) TIMES FIVE, USING LEFT INTERNAL MAMMARY ARTERY AND RIGHT GREATER SAPHENOUS VEIN HARVESTED ENDOSCOPICALLY (N/A) TRANSESOPHAGEAL ECHOCARDIOGRAM (N/A)  POD 2  Hemodynamically stable. Continue low dose Lopressor.  Postop atrial fib with RVR converted with IV amio. Will continue IV today. Keep sleeve in. He had bifascicular block on ECG postop. Will check ECG today.  Volume excess: Wt is 12 lbs over preop. He has stage 3a CKD. Will start diuresis today. Creat is stable.  Glucose under good control. DC CBG's.  Preop NSTEMI. Will start Plavix with ASA once pacing wires out.  IS, OOB, continue ambulation.  Preop pericarditis with acute inflammation at time of surgery probably related to MI. Continue colchicine as long as her tolerates it.   LOS: 5 days    [name] 09/09/2022

## 2022-09-09 NOTE — Discharge Instructions (Signed)

## 2022-09-10 ENCOUNTER — Other Ambulatory Visit: Payer: Self-pay | Admitting: *Deleted

## 2022-09-10 DIAGNOSIS — Z951 Presence of aortocoronary bypass graft: Secondary | ICD-10-CM

## 2022-09-10 LAB — CBC
HCT: 29.3 % — ABNORMAL LOW (ref 39.0–52.0)
Hemoglobin: 10 g/dL — ABNORMAL LOW (ref 13.0–17.0)
MCH: 31.9 pg (ref 26.0–34.0)
MCHC: 34.1 g/dL (ref 30.0–36.0)
MCV: 93.6 fL (ref 80.0–100.0)
Platelets: 180 10*3/uL (ref 150–400)
RBC: 3.13 MIL/uL — ABNORMAL LOW (ref 4.22–5.81)
RDW: 12.1 % (ref 11.5–15.5)
WBC: 14.8 10*3/uL — ABNORMAL HIGH (ref 4.0–10.5)
nRBC: 0 % (ref 0.0–0.2)

## 2022-09-10 LAB — BASIC METABOLIC PANEL WITH GFR
Anion gap: 12 (ref 5–15)
BUN: 21 mg/dL (ref 8–23)
CO2: 23 mmol/L (ref 22–32)
Calcium: 8.3 mg/dL — ABNORMAL LOW (ref 8.9–10.3)
Chloride: 98 mmol/L (ref 98–111)
Creatinine, Ser: 1.4 mg/dL — ABNORMAL HIGH (ref 0.61–1.24)
GFR, Estimated: 52 mL/min — ABNORMAL LOW
Glucose, Bld: 153 mg/dL — ABNORMAL HIGH (ref 70–99)
Potassium: 4 mmol/L (ref 3.5–5.1)
Sodium: 133 mmol/L — ABNORMAL LOW (ref 135–145)

## 2022-09-10 LAB — ECHO INTRAOPERATIVE TEE
Height: 70 in
S' Lateral: 2.1 cm
Weight: 3015.89 [oz_av]

## 2022-09-10 MED ORDER — AMIODARONE HCL 200 MG PO TABS
200.0000 mg | ORAL_TABLET | Freq: Two times a day (BID) | ORAL | Status: DC
  Administered 2022-09-10 – 2022-09-12 (×5): 200 mg via ORAL
  Filled 2022-09-10 (×5): qty 1

## 2022-09-10 MED FILL — Heparin Sodium (Porcine) Inj 1000 Unit/ML: INTRAMUSCULAR | Qty: 2500 | Status: AC

## 2022-09-10 MED FILL — Potassium Chloride Inj 2 mEq/ML: INTRAVENOUS | Qty: 40 | Status: AC

## 2022-09-10 MED FILL — Magnesium Sulfate Inj 50%: INTRAMUSCULAR | Qty: 10 | Status: AC

## 2022-09-10 MED FILL — Heparin Sodium (Porcine) Inj 1000 Unit/ML: Qty: 1000 | Status: AC
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
## 2022-09-10 NOTE — Progress Notes (Signed)
3 Days Post-Op Procedure(s) (LRB): CORONARY ARTERY BYPASS GRAFTING (CABG) TIMES FIVE, USING LEFT INTERNAL MAMMARY ARTERY AND RIGHT GREATER SAPHENOUS VEIN HARVESTED ENDOSCOPICALLY (N/A) TRANSESOPHAGEAL ECHOCARDIOGRAM (N/A) Subjective: Sundowning overnight with confusion and pulling at things. He was started on Precedex which caused some hypotension. I stopped it this morning. He received some xanax last night for anxiety which may have let to confusion. Will stop it.   Objective: Vital signs in last 24 hours: Temp:  [97.8 F (36.6 C)-98.2 F (36.8 C)] 98.2 F (36.8 C) (07/08 2300) Pulse Rate:  [57-92] 57 (07/09 0500) Cardiac Rhythm: Normal sinus rhythm (07/09 0400) Resp:  [16-37] 19 (07/09 0500) BP: (84-147)/(44-83) 88/51 (07/09 0500) SpO2:  [86 %-97 %] 95 % (07/09 0500) Weight:  [89.8 kg] 89.8 kg (07/09 0500)  Hemodynamic parameters for last 24 hours:    Intake/Output from previous day: 07/08 0701 - 07/09 0700 In: 532.9 [I.V.:532.9] Out: 2495 [Urine:2495] Intake/Output this shift: Total I/O In: 175.7 [I.V.:175.7] Out: 1400 [Urine:1400]  General appearance: alert and cooperative Neurologic: intact Heart: regular rate and rhythm, S1, S2 normal, no murmur,no rub  Lungs: clear to auscultation bilaterally Extremities: no edema Wound: incision ok  Lab Results: Recent Labs    09/09/22 0411 09/10/22 0413  WBC 17.8* 14.8*  HGB 10.8* 10.0*  HCT 32.2* 29.3*  PLT 157 180   BMET:  Recent Labs    09/09/22 0411 09/10/22 0413  NA 128* 133*  K 4.0 4.0  CL 100 98  CO2 22 23  GLUCOSE 181* 153*  BUN 15 21  CREATININE 1.18 1.40*  CALCIUM 8.0* 8.3*    PT/INR:  Recent Labs    09/07/22 1537  LABPROT 19.1*  INR 1.6*   ABG    Component Value Date/Time   PHART 7.345 (L) 09/07/2022 2112   HCO3 20.8 09/07/2022 2112   TCO2 22 09/07/2022 2112   ACIDBASEDEF 5.0 (H) 09/07/2022 2112   O2SAT 97 09/07/2022 2112   CBG (last 3)  Recent Labs    09/09/22 0730 09/09/22 1135  09/09/22 1553  GLUCAP 123* 111* 116*    Assessment/Plan: S/P Procedure(s) (LRB): CORONARY ARTERY BYPASS GRAFTING (CABG) TIMES FIVE, USING LEFT INTERNAL MAMMARY ARTERY AND RIGHT GREATER SAPHENOUS VEIN HARVESTED ENDOSCOPICALLY (N/A) TRANSESOPHAGEAL ECHOCARDIOGRAM (N/A)  POD 3  Hemodynamically stable yesterday but some hypotension overnight on Precedex. I stopped it this am. Hold off on Lopressor for now.  Postop atrial fib converted on IV amio. Will switch to po. ECG postop shows bifascicular block so will have monitor closely on amio. Keep pacing wires in for now.  DC sleeve.  Volume excess: -2L yesterday and wt down 2 lbs but I am sure it is a bed wt since he is in mitts in bed. Hold off on further diuresis for now.  Hx of 3a CKD: creat bumped slightly after diuresis yesterday. Repeat in am.  Sundowning likely related to surgery, ICU, baseline anxiety and xanax. Seems better this am but will monitor. He is alert and oriented this am to place, situation, president.  IS, OOB, mobilize.    LOS: 6 days    [name] 09/10/2022

## 2022-09-10 NOTE — Progress Notes (Signed)
Patient ID: Rodney Bach., male   DOB: 1945-10-27, 77 y.o.   MRN: [id number]  [facility] Evening Rounds:  Hemodynamically stable in sinus rhythm on po amio.  Ambulated a couple times today and felt well.  Had BM.  Slept some today.  Plan to transfer to 4E tomorrow if no problems overnight.

## 2022-09-11 LAB — BASIC METABOLIC PANEL WITH GFR
Anion gap: 10 (ref 5–15)
BUN: 20 mg/dL (ref 8–23)
CO2: 24 mmol/L (ref 22–32)
Calcium: 8.2 mg/dL — ABNORMAL LOW (ref 8.9–10.3)
Chloride: 100 mmol/L (ref 98–111)
Creatinine, Ser: 1.42 mg/dL — ABNORMAL HIGH (ref 0.61–1.24)
GFR, Estimated: 51 mL/min — ABNORMAL LOW
Glucose, Bld: 123 mg/dL — ABNORMAL HIGH (ref 70–99)
Potassium: 3.4 mmol/L — ABNORMAL LOW (ref 3.5–5.1)
Sodium: 134 mmol/L — ABNORMAL LOW (ref 135–145)

## 2022-09-11 LAB — CBC
HCT: 32.2 % — ABNORMAL LOW (ref 39.0–52.0)
Hemoglobin: 11.1 g/dL — ABNORMAL LOW (ref 13.0–17.0)
MCH: 32.6 pg (ref 26.0–34.0)
MCHC: 34.5 g/dL (ref 30.0–36.0)
MCV: 94.4 fL (ref 80.0–100.0)
Platelets: 247 10*3/uL (ref 150–400)
RBC: 3.41 MIL/uL — ABNORMAL LOW (ref 4.22–5.81)
RDW: 12.1 % (ref 11.5–15.5)
WBC: 13.7 10*3/uL — ABNORMAL HIGH (ref 4.0–10.5)
nRBC: 0 % (ref 0.0–0.2)

## 2022-09-11 MED ORDER — POTASSIUM CHLORIDE CRYS ER 20 MEQ PO TBCR
20.0000 meq | EXTENDED_RELEASE_TABLET | ORAL | Status: AC
  Administered 2022-09-11 (×3): 20 meq via ORAL
  Filled 2022-09-11 (×3): qty 1

## 2022-09-11 NOTE — TOC Initial Note (Signed)
Transition of Care (TOC) - Initial/Assessment Note    Patient Details  Name: Rodney Hall. MRN: [id number] Date of Birth: 1945/08/24  Transition of Care [facility]) CM/SW Contact:    Elliot Cousin, RN Phone Number:  [phone] 09/11/2022, 1:50 PM  Clinical Narrative:                 TOC CM spoke to pt and wife at bedside. States he was independent PTA. Pt has RW and cane at home. He was going to the gym everyday. Wants to get back to his baseline. Wife at home to assist with care and provide transportation. Waiting PT/OT recommendations.   Expected Discharge Plan: IP Rehab Facility Barriers to Discharge: Continued Medical Work up   Patient Goals and CMS Choice Patient states their goals for this hospitalization and ongoing recovery are:: wants to get back to his independence CMS Medicare.gov Compare Post Acute Care list provided to:: Patient Represenative (must comment) Choice offered to / list presented to : Spouse      Expected Discharge Plan and Services   Discharge Planning Services: CM Consult Post Acute Care Choice: IP Rehab Living arrangements for the past 2 months: Single Family Home                                      Prior Living Arrangements/Services Living arrangements for the past 2 months: Single Family Home Lives with:: Spouse Patient language and need for interpreter reviewed:: Yes Do you feel safe going back to the place where you live?: Yes      Need for Family Participation in Patient Care: Yes (Comment) Care giver support system in place?: Yes (comment) Current home services: DME (rolling walker, cane) Criminal Activity/Legal Involvement Pertinent to Current Situation/Hospitalization: No - Comment as needed  Activities of Daily Living Home Assistive Devices/Equipment: None ADL Screening (condition at time of admission) Patient's cognitive ability adequate to safely complete daily activities?: Yes Is the patient deaf or have  difficulty hearing?: No Does the patient have difficulty seeing, even when wearing glasses/contacts?: No Does the patient have difficulty concentrating, remembering, or making decisions?: No Patient able to express need for assistance with ADLs?: Yes Does the patient have difficulty dressing or bathing?: No Independently performs ADLs?: Yes (appropriate for developmental age) Does the patient have difficulty walking or climbing stairs?: No Weakness of Legs: None Weakness of Arms/Hands: None  Permission Sought/Granted Permission sought to share information with : Case Manager, Family Supports, PCP Permission granted to share information with : Yes, Verbal Permission Granted  Share Information with NAME: Davanta Meuser  Permission granted to share info w AGENCY: Home Health, Inpatient Rehab  Permission granted to share info w Relationship: wife  Permission granted to share info w Contact Information: [phone]  Emotional Assessment Appearance:: Appears stated age Attitude/Demeanor/Rapport: Engaged Affect (typically observed): Accepting Orientation: : Oriented to Self, Oriented to Place, Oriented to  Time, Oriented to Situation   Psych Involvement: No (comment)  Admission diagnosis:  Coronary artery disease [I25.10] Patient Active Problem List   Diagnosis Date Noted   s/p cabg x 5 09/10/2022   Coronary artery disease 09/04/2022   NSTEMI (non-ST elevated myocardial infarction) (HCC) 09/03/2022   Hypertensive urgency 09/03/2022   Stage III chronic kidney disease (HCC) 09/03/2022   Chronic kidney disease, stage 3b (HCC) 10/11/2021   History of spinal fusion 03/13/2020   Hypertriglyceridemia 01/11/2019  History of colon polyps 01/11/2019   Abdominal mass    Impingement syndrome of shoulder region 06/30/2017   Pancreatic cyst    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 [place] [address] Phone: [phone] Fax: [phone]     Social Determinants of Health (SDOH) Social History: SDOH Screenings   Food Insecurity: No Food Insecurity (09/06/2022)  Housing: Patient Declined (09/06/2022)  Transportation Needs: No Transportation Needs (09/06/2022)  Utilities: Not At Risk (09/06/2022)  Alcohol Screen: Low Risk  (07/03/2022)  Depression (PHQ2-9): Low Risk  (07/03/2022)  Financial Resource Strain: Low Risk  (07/03/2022)  Physical Activity: Sufficiently Active (07/03/2022)  Social Connections: Moderately Isolated (07/03/2022)  Stress: No Stress Concern Present (07/03/2022)  Tobacco Use: Low Risk  (09/09/2022)   SDOH Interventions:     Readmission Risk Interventions     No data to display

## 2022-09-11 NOTE — Progress Notes (Signed)
Pt in bed with family present.  Pt received OHS book and education on restrictions, heart healthy diet, ex guidelines, Move in the Tube sheet, incentive spirometer use when d/c and CRPII. Pt denies questions and was encouraged to look in the book for additional information. Referral placed to [facility].   Answered all questions, pt looking forward to rehab. Pt reports that there are no DME needs at home.   Faustino Congress 09/11/2022 11:20 AM  [id number]

## 2022-09-11 NOTE — Progress Notes (Signed)
4 Days Post-Op Procedure(s) (LRB): CORONARY ARTERY BYPASS GRAFTING (CABG) TIMES FIVE, USING LEFT INTERNAL MAMMARY ARTERY AND RIGHT GREATER SAPHENOUS VEIN HARVESTED ENDOSCOPICALLY (N/A) TRANSESOPHAGEAL ECHOCARDIOGRAM (N/A) Subjective: No complaints. Slept fairly well, ambulated this am. No confusion overnight.  Objective: Vital signs in last 24 hours: Temp:  [97.2 F (36.2 C)-97.9 F (36.6 C)] 97.2 F (36.2 C) (07/09 2330) Pulse Rate:  [62-96] 96 (07/10 0400) Cardiac Rhythm: Normal sinus rhythm (07/09 2030) Resp:  [17-30] 18 (07/10 0400) BP: (92-131)/(55-85) 115/76 (07/10 0300) SpO2:  [89 %-98 %] 91 % (07/10 0400) Weight:  [85.1 kg] 85.1 kg (07/10 0500)  Hemodynamic parameters for last 24 hours:    Intake/Output from previous day: 07/09 0701 - 07/10 0700 In: 1297.1 [P.O.:700; I.V.:597.1] Out: 1760 [Urine:1760] Intake/Output this shift: No intake/output data recorded.  General appearance: alert and cooperative Neurologic: intact Heart: regular rate and rhythm, S1, S2 normal, no murmur Lungs: clear to auscultation bilaterally Extremities: no edema Wound: incision healing well  Lab Results: Recent Labs    09/10/22 0413 09/11/22 0222  WBC 14.8* 13.7*  HGB 10.0* 11.1*  HCT 29.3* 32.2*  PLT 180 247   BMET:  Recent Labs    09/10/22 0413 09/11/22 0222  NA 133* 134*  K 4.0 3.4*  CL 98 100  CO2 23 24  GLUCOSE 153* 123*  BUN 21 20  CREATININE 1.40* 1.42*  CALCIUM 8.3* 8.2*    PT/INR: No results for input(s): "LABPROT", "INR" in the last 72 hours. ABG    Component Value Date/Time   PHART 7.345 (L) 09/07/2022 2112   HCO3 20.8 09/07/2022 2112   TCO2 22 09/07/2022 2112   ACIDBASEDEF 5.0 (H) 09/07/2022 2112   O2SAT 97 09/07/2022 2112   CBG (last 3)  Recent Labs    09/09/22 0730 09/09/22 1135 09/09/22 1553  GLUCAP 123* 111* 116*    Assessment/Plan: S/P Procedure(s) (LRB): CORONARY ARTERY BYPASS GRAFTING (CABG) TIMES FIVE, USING LEFT INTERNAL MAMMARY  ARTERY AND RIGHT GREATER SAPHENOUS VEIN HARVESTED ENDOSCOPICALLY (N/A) TRANSESOPHAGEAL ECHOCARDIOGRAM (N/A)  POD 4  Hemodynamically stable in sinus rhythm. Will resume low dose Lopressor.  Postop atrial fib converted with amio. Continue po 200 bid.  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 Will continue observation. DC pacing wires.  Wt is back to baseline.  3a CKD. Creat at baseline.  Hypokalemia: replacing.  NSTEMI preop. Start Plavix tomorrow.  Transfer to 4E. Continue IS, ambulation  Possibly home tomorrow.   LOS: 7 days    [name] 09/11/2022

## 2022-09-11 NOTE — Discharge Summary (Signed)
[address]       [place] [address]             [phone]    Physician Discharge Summary  Patient ID: Rodney Hall. MRN: [id number] DOB/AGE: 1945/05/26 77 y.o.  Admit date: 09/04/2022 Discharge date: 09/12/2022  Admission Diagnoses:  Patient Active Problem List   Diagnosis Date Noted   Coronary artery disease 09/04/2022   NSTEMI (non-ST elevated myocardial infarction) (HCC) 09/03/2022   Hypertensive urgency 09/03/2022   Stage III chronic kidney disease (HCC) 09/03/2022   Chronic kidney disease, stage 3b (HCC) 10/11/2021   History of spinal fusion 03/13/2020   Hypertriglyceridemia 01/11/2019   History of colon polyps 01/11/2019   Abdominal mass    Impingement syndrome of shoulder region 06/30/2017   Pancreatic cyst    Benign prostatic hyperplasia with urinary obstruction 04/06/2015   Prediabetes 04/06/2015   Interstitial cystitis 04/06/2015     Discharge Diagnoses:  Patient Active Problem List   Diagnosis Date Noted   s/p cabg x 5 09/10/2022   Coronary artery disease 09/04/2022   NSTEMI (non-ST elevated myocardial infarction) (HCC) 09/03/2022   Hypertensive urgency 09/03/2022   Stage III chronic kidney disease (HCC) 09/03/2022   Chronic kidney disease, stage 3b (HCC) 10/11/2021   History of spinal fusion 03/13/2020   Hypertriglyceridemia 01/11/2019   History of colon polyps 01/11/2019   Abdominal mass    Impingement syndrome of shoulder region 06/30/2017   Pancreatic cyst    Benign prostatic hyperplasia with urinary obstruction 04/06/2015   Prediabetes 04/06/2015   Interstitial cystitis 04/06/2015   Discharged Condition: good  History of Present Illness:  Rodney Hall is a 77 year old gentleman with a past history notable for stage III chronic kidney disease, benign prostatic hyperplasia, and dyslipidemia.  He developed chest pain while at home last evening while preparing his grill.  He describes it as a deep pressure rated  at 9/10.  The pain had improved by the time he arrived to the [facility] ED.  Initial EKG showed sinus rhythm with a left fascicular block and suspected Q waves in the inferior leads.  Chest x-ray showed no acute cardiopulmonary disease.  Initial high-sensitivity troponin was 11 but when repeated later was 599.  After being receiving sublingual nitroglycerin and being started on a nitroglycerin  infusion, he has had no further chest pain.  He was admitted with diagnosis of acute non-ST elevation myocardial infarction.  He was treated with standard protocols and underwent left heart catheterization by Rodney Hall.  This demonstrated severe three-vessel coronary artery disease with 70% and 90% tandem mid to distal LAD stenoses, 70% mid circumflex stenosis, and 80 to 90% posterior descending coronary stenosis.  His ejection fraction by left ventriculogram was estimated at 30 to 35% with apical dyskinesis.  An echocardiogram has been ordered but not yet performed.  Cardiothoracic surgery was asked to evaluate Rodney Hall for consideration of coronary bypass grafting for severe three-vessel coronary artery disease after presenting with acute non-ST elevation myocardial infarction and depressed LV function.  Hospital Course:   He was evaluated by Rodney Hall for bypass surgery.  He reports he has not had any further chest pain since arrival to the emergency room.  He lives a fairly active lifestyle and pays attention to his health.  He rides his bicycle for an hour 6 days a week and had not experienced any cardiac symptoms until the event last night.  He has  been careful with his diet over the last year and has been able to shift his kidney disease from stage IIIb back to stage IIIa.  He sees a Education officer, community every 6 months and has no dental issues currently.  He has never used tobacco products.  He was felt to be a candidate for bypass surgery and he was agreeable to proceed.  Unfortunately the patient  developed complaints of chest pain the evening of 09/05/2022.  He required return to the catheterization lab with placement of an Intra Aortic Balloon pump to stabilize symptoms.  He denied further chest pain.  However it was felt his surgery should be performed sooner.  Rodney Hall took the patient to the operating room on 09/07/2022.  He underwent CABG x 5 utilizing LIMA to LAD, SVG to OM 2, SVG to Diagonal, and sequential SVG to PDA and PL.  He also underwent endoscopic harvest greater saphenous vein from the right leg.  He tolerated the procedure without difficulty and was taken to the SICU in stable condition.  The patient was extubated the evening of surgery.  His IABP was removed without difficulty on POD #1.  Vascular pulses remained intact post removal.  His post operative EKG showed a bifascular block and due to this beta blockade was not initiated.  He developed a low grade temperature.  Intraoperative he was noted to have evidence of pericarditis, due to this he was started on colchicine.  His chest tubes, arterial lines, and swan ganz catheter were removed without difficulty.  He developed Atrial Fibrillation with RVR.  He was treated with IV Amiodarone bolus and drip.  He was started on diuretics for volume overloaded state.  The patient developed sundowning with agitation/confusion and pulling at lines.  He was started on Precedex which resulted in subsequent hypotension and had to be discontinued.  He developed an AKI in setting of CKD Stage 3a felt to be due to diuretics.  The patient's confusion agitation improved without further issue.  He was started on low dose Lopressor and was maintaining NSR with rates in the 90-100s.  His pacing wires were removed without difficulty.  He will be started on Plavix prior to discharge.  He was transferred to the progressive care unit on 09/11/2022.  The patient remains in NSR.  He is tolerating the addition of Lopressor without difficulty.  He is ambulating  independently.  His surgical incisions are healing without evidence of infection.  He is medically stable for discharge home today.  Consults: cardiology  Significant Diagnostic Studies: angiography:   Conclusions: Severe three-vessel coronary artery disease, as detailed below.  Culprit lesion for the patient's NSTEMI is most likely occluded distal segment of OM1.  There are also sequential 90% stenoses of the mid/distal LAD, 70% mid LCx lesion, and 80-90% ostial rPDA and proximal rPLA stenoses. Moderately to severely reduced left ventricular systolic function (LVEF 30-35%) with apical dyskinesis. Upper normal left ventricular filling pressure (LVEDP 15 mmHg).   Recommendations: Transfer to [facility] for cardiac surgery consultation for CABG. Restart heparin infusion 2 hours after TR band removal.  Defer addition of P2Y12 inhibitor pending cardiac surgery consultation. Aggressive secondary prevention of coronary artery disease. Maintain net even fluid balance and escalate goal-directed medical therapy for HFrEF as tolerated. Follow-up echocardiogram.   Yvonne Kendall, MD [facility]      Mid LM to [facility] LAD lesion is 30% stenosed with 30% stenosed side branch in Ost Cx.   Mid LAD-1 lesion is 90% stenosed.  Mid LAD-2 lesion is 90% stenosed.   Dist LAD lesion is 90% stenosed.   Mid Cx to Dist Cx lesion is 70% stenosed.   2nd Diag lesion is 70% stenosed.   2nd Mrg lesion is 100% stenosed.   RPDA lesion is 80% stenosed.   RPAV lesion is 90% stenosed.   LV end diastolic pressure is normal.   No change in severe 3 vessel obstructive CAD Normal LVEDP IABP placed for refractory angina    Treatments: surgery:                                                                                                CARDIOVASCULAR SURGERY OPERATIVE NOTE   09/07/2022   Surgeon:  [name], MD   First Assistant: Lowella Dandy,  PA-C:   An experienced assistant was required given the  complexity of this surgery and the standard of surgical care. The assistant was needed for endoscopic vein harvest, exposure, dissection, suctioning, retraction of delicate tissues and sutures, instrument exchange and for overall help during this procedure.     Preoperative Diagnosis:  Severe multi-vessel coronary artery disease     Postoperative Diagnosis:  Same     Procedure:   Median Sternotomy Extracorporeal circulation 3.   Coronary artery bypass grafting x 5   Left internal mammary artery graft to the LAD SVG to diagonal SVG to OM2 Sequential SVG to PDA and PL (RCA) 4.   Endoscopic vein harvest from the right leg     Anesthesia:  General Endotracheal       Discharge Exam: Blood pressure (!) 145/78, pulse 94, temperature 97.9 F (36.6 C), temperature source Oral, resp. rate (!) 22, height 5\' 10"  (1.778 m), weight 83.3 kg, SpO2 96%.  General appearance: alert and cooperative Neurologic: intact Heart: regular rate and rhythm Lungs: clear to auscultation bilaterally Extremities: no edema Wound: incision healing well  Discharge Medications:  The patient has been discharged on:   1.Beta Blocker:  Yes [ X  ]                              No   [   ]                              If No, reason:  2.Ace Inhibitor/ARB: Yes [   ]                                     No  [ x   ]                                     If No, reason: labile BP, CKD  3.Statin:   Yes [ X  ]                  No  [   ]  If No, reason:  4.Ecasa:  Yes  [ X  ]                  No   [   ]                  If No, reason:  Patient had ACS upon admission: Yes  Plavix/P2Y12 inhibitor: Yes [ X  ]                                      No  [   ]     Discharge Instructions     Amb Referral to Cardiac Rehabilitation   Complete by: As directed    Diagnosis: CABG   CABG X ___: 5   After initial evaluation and assessments completed: Virtual Based Care may be provided alone or in  conjunction with Phase 2 Cardiac Rehab based on patient barriers.: Yes   Intensive Cardiac Rehabilitation (ICR) [facility] location only OR Traditional Cardiac Rehabilitation (TCR) *If criteria for ICR are not met will enroll in TCR [facility] only): Yes      Allergies as of 09/12/2022       Reactions   Pravastatin    jittery   Amoxicillin Rash   ++ got ceftriaxone in the past++ Has patient had a PCN reaction causing immediate rash, facial/tongue/throat swelling, SOB or lightheadedness with hypotension: Yes Has patient had a PCN reaction causing severe rash involving mucus membranes or skin necrosis: No Has patient had a PCN reaction that required hospitalization No Has patient had a PCN reaction occurring within the last 10 years: No If all of the above answers are "NO", then may proceed with Cephalosporin use.        Medication List     TAKE these medications    acetaminophen 325 MG tablet Commonly known as: TYLENOL Take 650 mg by mouth every 6 (six) hours as needed for moderate pain or headache.   amiodarone 200 MG tablet Commonly known as: PACERONE Take 1 tablet (200 mg total) by mouth 2 (two) times daily. X 7 days, then decrease to 200 mg daily   aspirin EC 81 MG tablet Take 1 tablet (81 mg total) by mouth daily. Swallow whole.   atorvastatin 80 MG tablet Commonly known as: LIPITOR Take 1 tablet (80 mg total) by mouth daily.   clopidogrel 75 MG tablet Commonly known as: PLAVIX Take 1 tablet (75 mg total) by mouth daily.   imipramine 50 MG tablet Commonly known as: TOFRANIL Take 50 mg by mouth at bedtime.   metoprolol tartrate 25 MG tablet Commonly known as: LOPRESSOR Take 0.5 tablets (12.5 mg total) by mouth 2 (two) times daily.   tamsulosin 0.4 MG Caps capsule Commonly known as: FLOMAX Take 0.4 mg by mouth daily after supper.   traMADol 50 MG tablet Commonly known as: ULTRAM Take 1 tablet (50 mg total) by mouth every 6 (six) hours as needed for moderate pain.         Follow-up Information     [facility] Follow up on 09/19/2022.   Specialty: Cardiothoracic Surgery Why: Appointment is at 11:00, For suture removal Contact information: [address] [place], [address] [place] [address] [phone]        [name], MD Follow up on 10/09/2022.   Specialty: Cardiothoracic Surgery Why: Appointment is at 1:00,  pleaset get CXR at 12:00 at [facility] located on first floor of our office building Contact information: [address] [employer] [address] [place] [address] [phone]          IMAGING Follow up on 10/09/2022.   Why: Please get CXR at 12:00 Contact information: [address] [place] [address]        Fransico Michael, Cadence H, PA-C Follow up.   Specialty: Cardiology Why: Appointment is at 8:50 Contact information: [address] [place] [address] [phone]                 Signed:  Lowella Dandy, PA-C  09/12/2022, 7:27 AM

## 2022-09-11 NOTE — Progress Notes (Signed)
EVENING ROUNDS NOTE :     [address]       [place] [address]             [phone]                 4 Days Post-Op Procedure(s) (LRB): CORONARY ARTERY BYPASS GRAFTING (CABG) TIMES FIVE, USING LEFT INTERNAL MAMMARY ARTERY AND RIGHT GREATER SAPHENOUS VEIN HARVESTED ENDOSCOPICALLY (N/A) TRANSESOPHAGEAL ECHOCARDIOGRAM (N/A)   Total Length of Stay:  LOS: 7 days  Events:   Awaiting floor bed    BP 103/82   Pulse 91   Temp 97.9 F (36.6 C) (Oral)   Resp 20   Ht 5\' 10"  (1.778 m)   Wt 85.1 kg   SpO2 90%   BMI 26.92 kg/m          sodium chloride Stopped (09/08/22 9528)   lactated ringers Stopped (09/10/22 1001)    I/O last 3 completed shifts: In: 1472.8 [P.O.:700; I.V.:772.8] Out: 3160 [Urine:3160]      Latest Ref Rng & Units 09/11/2022    2:22 AM 09/10/2022    4:13 AM 09/09/2022    4:11 AM  CBC  WBC 4.0 - 10.5 K/uL 13.7  14.8  17.8   Hemoglobin 13.0 - 17.0 g/dL 41.3  24.4  01.0   Hematocrit 39.0 - 52.0 % 32.2  29.3  32.2   Platelets 150 - 400 K/uL 247  180  157        Latest Ref Rng & Units 09/11/2022    2:22 AM 09/10/2022    4:13 AM 09/09/2022    4:11 AM  BMP  Glucose 70 - 99 mg/dL 272  536  644   BUN 8 - 23 mg/dL 20  21  15    Creatinine 0.61 - 1.24 mg/dL 0.34  7.42  5.95   Sodium 135 - 145 mmol/L 134  133  128   Potassium 3.5 - 5.1 mmol/L 3.4  4.0  4.0   Chloride 98 - 111 mmol/L 100  98  100   CO2 22 - 32 mmol/L 24  23  22    Calcium 8.9 - 10.3 mg/dL 8.2  8.3  8.0     ABG    Component Value Date/Time   PHART 7.345 (L) 09/07/2022 2112   PCO2ART 37.9 09/07/2022 2112   PO2ART 93 09/07/2022 2112   HCO3 20.8 09/07/2022 2112   TCO2 22 09/07/2022 2112   ACIDBASEDEF 5.0 (H) 09/07/2022 2112   O2SAT 97 09/07/2022 2112       Brynda Greathouse, MD 09/11/2022 4:46 PM

## 2022-09-12 ENCOUNTER — Encounter (HOSPITAL_COMMUNITY): Payer: Self-pay | Admitting: Cardiovascular Disease

## 2022-09-12 MED ORDER — METOPROLOL TARTRATE 12.5 MG HALF TABLET
12.5000 mg | ORAL_TABLET | Freq: Two times a day (BID) | ORAL | Status: DC
  Administered 2022-09-12: 12.5 mg via ORAL
  Filled 2022-09-12: qty 1

## 2022-09-12 MED ORDER — CLOPIDOGREL BISULFATE 75 MG PO TABS
75.0000 mg | ORAL_TABLET | Freq: Every day | ORAL | Status: DC
  Administered 2022-09-12: 75 mg via ORAL
  Filled 2022-09-12: qty 1

## 2022-09-12 MED ORDER — METOPROLOL TARTRATE 25 MG PO TABS: 12.5000 mg | ORAL_TABLET | Freq: Two times a day (BID) | ORAL | 3 refills | Status: DC

## 2022-09-12 MED ORDER — CLOPIDOGREL BISULFATE 75 MG PO TABS: 75.0000 mg | ORAL_TABLET | Freq: Every day | ORAL | 3 refills | Status: DC

## 2022-09-12 MED ORDER — AMIODARONE HCL 200 MG PO TABS: 200.0000 mg | ORAL_TABLET | Freq: Two times a day (BID) | ORAL | 0 refills | Status: DC

## 2022-09-12 MED ORDER — ASPIRIN 81 MG PO TBEC: 81.0000 mg | DELAYED_RELEASE_TABLET | Freq: Every day | ORAL | 12 refills | Status: AC

## 2022-09-12 MED ORDER — ASPIRIN 81 MG PO TBEC
81.0000 mg | DELAYED_RELEASE_TABLET | Freq: Every day | ORAL | Status: DC
  Administered 2022-09-12: 81 mg via ORAL
  Filled 2022-09-12: qty 1

## 2022-09-12 MED ORDER — TRAMADOL HCL 50 MG PO TABS: 50.0000 mg | ORAL_TABLET | Freq: Four times a day (QID) | ORAL | 0 refills | Status: DC | PRN

## 2022-09-12 MED FILL — Lidocaine HCl Local Soln Prefilled Syringe 100 MG/5ML (2%): INTRAMUSCULAR | Qty: 5 | Status: AC

## 2022-09-12 MED FILL — Sodium Bicarbonate IV Soln 8.4%: INTRAVENOUS | Qty: 50 | Status: AC

## 2022-09-12 MED FILL — Heparin Sodium (Porcine) Inj 1000 Unit/ML: INTRAMUSCULAR | Qty: 10 | Status: AC

## 2022-09-12 MED FILL — Mannitol IV Soln 20%: INTRAVENOUS | Qty: 500 | Status: AC

## 2022-09-12 MED FILL — Electrolyte-R (PH 7.4) Solution: INTRAVENOUS | Qty: 4000 | Status: AC

## 2022-09-12 MED FILL — Sodium Chloride IV Soln 0.9%: INTRAVENOUS | Qty: 4000 | Status: AC

## 2022-09-12 MED FILL — Heparin Sodium (Porcine) Inj 1000 Unit/ML: INTRAMUSCULAR | Qty: 30 | Status: AC

## 2022-09-12 NOTE — Progress Notes (Signed)
Pt dressed and ready to go, denies questions from yesterdays teaching. Referral in system.

## 2022-09-12 NOTE — Progress Notes (Signed)
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  Slept ok, ambulating well. Wants to go home.  Objective: Vital signs in last 24 hours: Temp:  [97.5 F (36.4 C)-98.2 F (36.8 C)] 97.9 F (36.6 C) (07/11 0429) Pulse Rate:  [82-102] 94 (07/11 0429) Cardiac Rhythm: Normal sinus rhythm;Sinus tachycardia (07/10 1958) Resp:  [17-25] 22 (07/11 0429) BP: (103-149)/(72-96) 145/78 (07/11 0429) SpO2:  [85 %-98 %] 96 % (07/11 0429) Weight:  [83.3 kg] 83.3 kg (07/11 0600)  Hemodynamic parameters for last 24 hours:    Intake/Output from previous day: 07/10 0701 - 07/11 0700 In: 900 [P.O.:900] Out: 1379 [Urine:1375; Stool:4] Intake/Output this shift: No intake/output data recorded.  General appearance: alert and cooperative Neurologic: intact Heart: regular rate and rhythm Lungs: clear to auscultation bilaterally Extremities: no edema Wound: incision healing well  Lab Results: Recent Labs    09/10/22 0413 09/11/22 0222  WBC 14.8* 13.7*  HGB 10.0* 11.1*  HCT 29.3* 32.2*  PLT 180 247   BMET:  Recent Labs    09/10/22 0413 09/11/22 0222  NA 133* 134*  K 4.0 3.4*  CL 98 100  CO2 23 24  GLUCOSE 153* 123*  BUN 21 20  CREATININE 1.40* 1.42*  CALCIUM 8.3* 8.2*    PT/INR: No results for input(s): "LABPROT", "INR" in the last 72 hours. 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 Plan to send home on Lopressor 12.5 bid and amio 200 bid.   DC colchicine.  ASA 81 mg and Plavix 75 mg daily.  Home today.   LOS: 8 days    [name] 09/12/2022

## 2022-09-12 NOTE — Progress Notes (Signed)
   09/12/22 0600  Mobility  HOB Elevated/Bed Position Self regulated;HOB 30  Activity Ambulated with assistance in hallway;Ambulated with assistance in room  Range of Motion/Exercises Active;All extremities  Level of Assistance Standby assist, set-up cues, supervision of patient - no hands on  Assistive Device Front wheel walker  Distance Ambulated (ft) 470 ft  Activity Response Tolerated well, highest HR 118 during walking.    Filiberto Pinks, RN

## 2022-09-12 NOTE — Progress Notes (Addendum)
Pt is alert, fully oriented x 4, stable hemodynamically, afebrile. EKG is NSR/ ST with RBBB on the monitor. HR 80s-110. Normal respirations on room air, no acute distress noted overnight. Pain is well tolerated. He is able to ambulate well with one assistance in room.  Mid sternal, right groin and right leg incisions are dry and clean, no drainage, cleaned with Betadine. Pacer wires were removed prior transferring to 4E.   Pt has complaints of diarrhea x 4 tonight. Possibly from getting laxative and stool softener. We will hold the med. Continue to monitor.  Filiberto Pinks, RN

## 2022-09-12 NOTE — Progress Notes (Signed)
Discharge orders received, education reviewed with pt.  Medications, follow up appts, and postop care addressed.  IV and telemetry removed, CCMD notified.  Pt ride expected to arrive around 10am.

## 2022-09-12 NOTE — Plan of Care (Signed)
  Problem: Education: Goal: Knowledge of General Education information will improve Description: Including pain rating scale, medication(s)/side effects and non-pharmacologic comfort measures Outcome: Adequate for Discharge   Problem: Health Behavior/Discharge Planning: Goal: Ability to manage health-related needs will improve Outcome: Adequate for Discharge   Problem: Clinical Measurements: Goal: Ability to maintain clinical measurements within normal limits will improve Outcome: Adequate for Discharge Goal: Will remain free from infection Outcome: Adequate for Discharge Goal: Diagnostic test results will improve Outcome: Adequate for Discharge Goal: Respiratory complications will improve Outcome: Adequate for Discharge Goal: Cardiovascular complication will be avoided Outcome: Adequate for Discharge   Problem: Activity: Goal: Risk for activity intolerance will decrease Outcome: Adequate for Discharge   Problem: Nutrition: Goal: Adequate nutrition will be maintained Outcome: Adequate for Discharge   Problem: Coping: Goal: Level of anxiety will decrease Outcome: Adequate for Discharge   Problem: Elimination: Goal: Will not experience complications related to bowel motility Outcome: Adequate for Discharge Goal: Will not experience complications related to urinary retention Outcome: Adequate for Discharge   Problem: Pain Managment: Goal: General experience of comfort will improve Outcome: Adequate for Discharge   Problem: Safety: Goal: Ability to remain free from injury will improve Outcome: Adequate for Discharge   Problem: Skin Integrity: Goal: Risk for impaired skin integrity will decrease Outcome: Adequate for Discharge   Problem: Education: Goal: Understanding of cardiac disease, CV risk reduction, and recovery process will improve Outcome: Adequate for Discharge Goal: Individualized Educational Video(s) Outcome: Adequate for Discharge   Problem:  Activity: Goal: Ability to tolerate increased activity will improve Outcome: Adequate for Discharge   Problem: Cardiac: Goal: Ability to achieve and maintain adequate cardiovascular perfusion will improve Outcome: Adequate for Discharge   Problem: Health Behavior/Discharge Planning: Goal: Ability to safely manage health-related needs after discharge will improve Outcome: Adequate for Discharge   Problem: Education: Goal: Understanding of CV disease, CV risk reduction, and recovery process will improve Outcome: Adequate for Discharge Goal: Individualized Educational Video(s) Outcome: Adequate for Discharge   Problem: Activity: Goal: Ability to return to baseline activity level will improve Outcome: Adequate for Discharge   Problem: Cardiovascular: Goal: Ability to achieve and maintain adequate cardiovascular perfusion will improve Outcome: Adequate for Discharge Goal: Vascular access site(s) Level 0-1 will be maintained Outcome: Adequate for Discharge   Problem: Health Behavior/Discharge Planning: Goal: Ability to safely manage health-related needs after discharge will improve Outcome: Adequate for Discharge   Problem: Education: Goal: Will demonstrate proper wound care and an understanding of methods to prevent future damage Outcome: Adequate for Discharge Goal: Knowledge of disease or condition will improve Outcome: Adequate for Discharge Goal: Knowledge of the prescribed therapeutic regimen will improve Outcome: Adequate for Discharge Goal: Individualized Educational Video(s) Outcome: Adequate for Discharge   Problem: Activity: Goal: Risk for activity intolerance will decrease Outcome: Adequate for Discharge   Problem: Cardiac: Goal: Will achieve and/or maintain hemodynamic stability Outcome: Adequate for Discharge   Problem: Clinical Measurements: Goal: Postoperative complications will be avoided or minimized Outcome: Adequate for Discharge   Problem:  Respiratory: Goal: Respiratory status will improve Outcome: Adequate for Discharge   Problem: Skin Integrity: Goal: Wound healing without signs and symptoms of infection Outcome: Adequate for Discharge Goal: Risk for impaired skin integrity will decrease Outcome: Adequate for Discharge   Problem: Urinary Elimination: Goal: Ability to achieve and maintain adequate renal perfusion and functioning will improve Outcome: Adequate for Discharge   

## 2022-09-12 NOTE — TOC Transition Note (Signed)
Transition of Care (TOC) - CM/SW Discharge Note Donn Pierini RN, BSN Transitions of Care Unit 4E- RN Case Manager See Treatment Team for direct phone #   Patient Details  Name: Rodney Hall. MRN: [id number] Date of Birth: [birth year]  Transition of Care [facility]) CM/SW Contact:  Darrold Span, RN Phone Number: 09/12/2022, 9:52 AM   Clinical Narrative:    Pt stable for transition home today, no TOC needs noted- plan to return home w/ wife.  Wife to transport pt home.    Final next level of care: Home/Self Care Barriers to Discharge: Barriers Resolved   Patient Goals and CMS Choice CMS Medicare.gov Compare Post Acute Care list provided to:: Patient Represenative (must comment) Choice offered to / list presented to : Spouse  Discharge Placement               Home          Discharge Plan and Services Additional resources added to the After Visit Summary for     Discharge Planning Services: CM Consult Post Acute Care Choice: NA          DME Arranged: N/A DME Agency: NA       HH Arranged: NA HH Agency: NA        Social Determinants of Health (SDOH) Interventions SDOH Screenings   Food Insecurity: No Food Insecurity (09/06/2022)  Housing: Patient Declined (09/06/2022)  Transportation Needs: No Transportation Needs (09/06/2022)  Utilities: Not At Risk (09/06/2022)  Alcohol Screen: Low Risk  (07/03/2022)  Depression (PHQ2-9): Low Risk  (07/03/2022)  Financial Resource Strain: Low Risk  (07/03/2022)  Physical Activity: Sufficiently Active (07/03/2022)  Social Connections: Moderately Isolated (07/03/2022)  Stress: No Stress Concern Present (07/03/2022)  Tobacco Use: Low Risk  (09/12/2022)     Readmission Risk Interventions    09/12/2022    9:52 AM  Readmission Risk Prevention Plan  Transportation Screening Complete  PCP or Specialist Appt within 5-7 Days Complete  Home Care Screening Complete  Medication Review (RN CM) Complete

## 2022-09-19 ENCOUNTER — Other Ambulatory Visit: Payer: Self-pay

## 2022-09-19 ENCOUNTER — Ambulatory Visit: Payer: Self-pay

## 2022-09-19 DIAGNOSIS — Z4802 Encounter for removal of sutures: Secondary | ICD-10-CM

## 2022-09-19 MED ORDER — ATORVASTATIN CALCIUM 80 MG PO TABS: 80.0000 mg | ORAL_TABLET | Freq: Every day | ORAL | Status: DC

## 2022-09-19 NOTE — Progress Notes (Signed)
Patient arrived for nurse visit to remove suture/staples post- procedure CABG with Dr. Laneta Simmers 09/07/22.  Three Sutures removed with no signs/ symptoms of infection noted.  Patient tolerated procedure well.  Incisions are well approximated. Patient/ family instructed to keep the incision sites clean and dry.  Patient/ family acknowledged instructions given.    Patient had questions about Atorvastatin. States he does not have the medication at home and it was not a medication that was sent to the pharmacy. Patient was started on medication at discharge from [facility] and medication was not sent in. Will speak with PA today regarding this.

## 2022-09-26 ENCOUNTER — Encounter: Payer: HMO | Admitting: Urology

## 2022-10-01 ENCOUNTER — Ambulatory Visit (INDEPENDENT_AMBULATORY_CARE_PROVIDER_SITE_OTHER): Payer: HMO

## 2022-10-01 ENCOUNTER — Encounter: Payer: Self-pay | Admitting: Medical

## 2022-10-01 ENCOUNTER — Ambulatory Visit: Payer: HMO | Attending: Medical | Admitting: Medical

## 2022-10-01 VITALS — BP 106/60 | HR 79 | Ht 70.0 in | Wt 181.0 lb

## 2022-10-01 DIAGNOSIS — Z951 Presence of aortocoronary bypass graft: Secondary | ICD-10-CM | POA: Diagnosis not present

## 2022-10-01 DIAGNOSIS — I480 Paroxysmal atrial fibrillation: Secondary | ICD-10-CM

## 2022-10-01 DIAGNOSIS — I214 Non-ST elevation (NSTEMI) myocardial infarction: Secondary | ICD-10-CM

## 2022-10-01 DIAGNOSIS — I4891 Unspecified atrial fibrillation: Secondary | ICD-10-CM

## 2022-10-01 DIAGNOSIS — Z79899 Other long term (current) drug therapy: Secondary | ICD-10-CM

## 2022-10-01 DIAGNOSIS — I951 Orthostatic hypotension: Secondary | ICD-10-CM

## 2022-10-01 DIAGNOSIS — I2510 Atherosclerotic heart disease of native coronary artery without angina pectoris: Secondary | ICD-10-CM

## 2022-10-01 DIAGNOSIS — E782 Mixed hyperlipidemia: Secondary | ICD-10-CM

## 2022-10-01 NOTE — Patient Instructions (Signed)
Medication Instructions:  Your physician recommends that you continue on your current medications as directed. Please refer to the Current Medication list given to you today.  *If you need a refill on your cardiac medications before your next appointment, please call your pharmacy*   Lab Work: Your provider would like for you to have following labs drawn today (CBC, BMP).     Testing/Procedures: Your physician has recommended that you wear a 14 day Zio monitor.   This monitor is a medical device that records the heart's electrical activity. Doctors most often use these monitors to diagnose arrhythmias. Arrhythmias are problems with the speed or rhythm of the heartbeat. The monitor is a small device applied to your chest. You can wear one while you do your normal daily activities. While wearing this monitor if you have any symptoms to push the button and record what you felt. Once you have worn this monitor for the period of time provider prescribed (Usually 14 days), you will return the monitor device in the postage paid box. Once it is returned they will download the data collected and provide [place] with a report which the provider will then review and we will call you with those results. Important tips:  Avoid showering during the first 24 hours of wearing the monitor. Avoid excessive sweating to help maximize wear time. Do not submerge the device, no hot tubs, and no swimming pools. Keep any lotions or oils away from the patch. After 24 hours you may shower with the patch on. Take brief showers with your back facing the shower head.  Do not remove patch once it has been placed because that will interrupt data and decrease adhesive wear time. Push the button when you have any symptoms and write down what you were feeling. Once you have completed wearing your monitor, remove and place into box which has postage paid and place in your outgoing mailbox.  If for some reason you have misplaced your  box then call our office and we can provide another box and/or mail it off for you.   Follow-Up: At [facility], you and your health needs are our priority.  As part of our continuing mission to provide you with exceptional heart care, we have created designated Provider Care Teams.  These Care Teams include your primary Cardiologist (physician) and Advanced Practice Providers (APPs -  Physician Assistants and Nurse Practitioners) who all work together to provide you with the care you need, when you need it.  We recommend signing up for the patient portal called "MyChart".  Sign up information is provided on this After Visit Summary.  MyChart is used to connect with patients for Virtual Visits (Telemedicine).  Patients are able to view lab/test results, encounter notes, upcoming appointments, etc.  Non-urgent messages can be sent to your provider as well.   To learn more about what you can do with MyChart, go to [email or web address].    Your next appointment:   4-6 week(s)  Provider:   You may see Julien Nordmann, MD or one of the following Advanced Practice Providers on your designated Care Team:   Nicolasa Ducking, NP Eula Listen, PA-C Cadence Fransico Michael, PA-C Charlsie Quest, NP

## 2022-10-01 NOTE — Progress Notes (Signed)
Cardiology Office Note:    Date:  10/01/2022   ID:  Rodney Bach., DOB 06-06-1945, MRN [id number]  PCP:  Rodney Limes, MD  [facility] Cardiologist:  Rodney Nordmann, MD  [facility] Electrophysiologist:  None   Referring MD: Rodney Limes, MD   Chief Complaint: Hospital follow-up  History of Present Illness:    Rodney Hall. is a 77 y.o. male with a hx of CKD stage III, BPH, dyslipidemia, CAD s/p CABG x5, post-op Afib not on a/c,  who presents for hospital follow-up.  Patient presented to the ER with chest pain 09/04/2022.  Troponin found to be greater than 500, he was admitted with a non-STEMI.  Left heart cath showed severe three-vessel CAD with 70% and 90% tandem mid to distal LAD stenosis, 70% mid circumflex stenosis, and 80 to 90% posterior descending coronary stenosis.  His EF by left ventriculogram was estimated at 30 to 35% with apical D kidney cyst.  Cardiothoracic surgery was asked to evaluate patient.  Patient developed complaints of chest pain on 09/05/2022 and went back to the lab for intra-aortic balloon pump to stabilize symptoms.  Patient underwent CABG x 5 on 09/07/2022, utilizing LIMA to LAD, SVG to OM 2, SVG to diagonal, and sequential SVG to PDA and PL.  BB held due to bifascicular block on EKG.  Postop patient developed brief A-fib RVR treated with IV amiodarone with conversion to NSR.  Patient required diuretics for volume overload.  Patient started to develop AKI in the setting of CKD stage III felt to be due to diuretics.  Patient also had confusion and agitation improved as other issues improved.  Patient was started on Lopressor for rate control, and Plavix prior to discharge.  Patient was discharged 09/12/2022.  Today, the patient reports he is overall doing well. He is back at home. Patient is still taking it easy, he will walk up the street once a day. He overall feels good. He may have one bad day a week when he feels tired and weak. He reports chest  pain that is intermittent. It is worse with a deep breath. May have it for about 15 minutes. Pain pills help the pain. It hurts with palpation. Has some SOB and dizziness when he stands up. BP is soft today. No lower leg edema. Had trouble with statins before, make him jittery.    Past Medical History:  Diagnosis Date   Abdominal mass    Abdominal pain 06/02/2017   Anxiety    Pt. denies   Arthritis    pt. 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    Past Surgical History:  Procedure Laterality Date   ANTERIOR LAT LUMBAR FUSION Right 07/30/2016   Procedure: Right Lateral two-three Lateral transpsoas interbody fusion with lateral plating/Minimally invasive decompression at Lumbar two-three;  Surgeon: Ditty, Loura Halt, MD;  Location: [facility] OR;  Service: Neurosurgery;  Laterality: Right;   BACK SURGERY  2012   CATARACT EXTRACTION, BILATERAL     CHOLECYSTECTOMY N/A 07/25/2017   Procedure: LAPAROSCOPIC CHOLECYSTECTOMY WITH INTRAOPERATIVE CHOLANGIOGRAM;  Surgeon: Daphine Deutscher,  Molli Hazard, MD;  Location: [facility];  Service: General;  Laterality: N/A;   COLONOSCOPY WITH PROPOFOL N/A 12/12/2017   Procedure: COLONOSCOPY WITH PROPOFOL;  Surgeon: Scot Jun, MD;  Location: [facility] ENDOSCOPY;  Service: Endoscopy;  Laterality: N/A;   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   Procedure: CORONARY ARTERY BYPASS GRAFTING (CABG) TIMES FIVE, USING LEFT INTERNAL MAMMARY ARTERY AND RIGHT GREATER SAPHENOUS VEIN HARVESTED ENDOSCOPICALLY;  Surgeon: [name], MD;  Location: [facility] OR;  Service: Open Heart Surgery;  Laterality: N/A;   EUS N/A 07/10/2017   Procedure:  UPPER ENDOSCOPIC ULTRASOUND (EUS) LINEAR;  Surgeon: Rachael Fee, MD;  Location: [facility] ENDOSCOPY;  Service: Endoscopy;  Laterality: N/A;   EYE SURGERY     HEMI-MICRODISCECTOMY LUMBAR LAMINECTOMY LEVEL 1  09/2018   [facility]   IABP INSERTION N/A 09/06/2022   Procedure: IABP Insertion;  Surgeon: Swazil[place], Peter M, MD;  Location: [facility] INVASIVE CV LAB;  Service: Cardiovascular;  Laterality: N/A;   LEFT HEART CATH AND CORONARY ANGIOGRAPHY N/A 09/04/2022   Procedure: LEFT HEART CATH AND CORONARY ANGIOGRAPHY;  Surgeon: Yvonne Kendall, MD;  Location: [facility] INVASIVE CV LAB;  Service: Cardiovascular;  Laterality: N/A;   LEFT HEART CATH AND CORONARY ANGIOGRAPHY N/A 09/06/2022   Procedure: LEFT HEART CATH AND CORONARY ANGIOGRAPHY;  Surgeon: Swaziland, Peter M, MD;  Location: [facility] INVASIVE CV LAB;  Service: Cardiovascular;  Laterality: N/A;   NECK SURGERY  2000   TEE WITHOUT CARDIOVERSION N/A 09/07/2022   Procedure: TRANSESOPHAGEAL ECHOCARDIOGRAM;  Surgeon: [name], MD;  Location: [facility] OR;  Service: Open Heart Surgery;  Laterality: N/A;   TENNIS ELBOW RELEASE/NIRSCHEL PROCEDURE     TONSILLECTOMY AND ADENOIDECTOMY      Current Medications: Current Meds  Medication Sig   acetaminophen (TYLENOL) 325 MG tablet Take 650 mg by mouth every 6 (six) hours as needed for moderate pain or headache.   amiodarone (PACERONE) 200 MG tablet Take 1 tablet (200 mg total) by mouth 2 (two) times daily. X 7 days, then decrease to 200 mg daily   aspirin EC 81 MG tablet Take 1 tablet (81 mg total) by mouth daily. Swallow whole.   clopidogrel (PLAVIX) 75 MG tablet Take 1 tablet (75 mg total) by mouth daily.   imipramine (TOFRANIL) 50 MG tablet Take 50 mg by mouth at bedtime.   metoprolol tartrate (LOPRESSOR) 25 MG tablet Take 0.5 tablets (12.5 mg total) by mouth 2 (two) times daily.   tamsulosin (FLOMAX) 0.4 MG CAPS capsule Take 0.4 mg by mouth daily after supper.    traMADol (ULTRAM) 50 MG tablet Take 1 tablet (50 mg  total) by mouth every 6 (six) hours as needed for moderate pain.     Allergies:   Pravastatin and Amoxicillin   Social History   Socioeconomic History   Marital status: Married    Spouse name: Not on file   Number of children: Not on file   Years of education: Not on file   Highest education level: Not on file  Occupational History   Not on file  Tobacco Use   Smoking status: Never   Smokeless tobacco: Never  Vaping Use   Vaping status: Never Used  Substance and Sexual Activity   Alcohol use: No    Alcohol/week: 0.0 standard drinks of alcohol   Drug use: No   Sexual activity: Yes  Other Topics Concern   Not on file  Social History Narrative   Not on file   Social Determinants of Health  Financial Resource Strain: Low Risk  (07/03/2022)   Overall Financial Resource Strain (CARDIA)    Difficulty of Paying Living Expenses: Not hard at all  Food Insecurity: No Food Insecurity (09/06/2022)   Hunger Vital Sign    Worried About Running Out of Food in the Last Year: Never true    Ran Out of Food in the Last Year: Never true  Transportation Needs: No Transportation Needs (09/06/2022)   PRAPARE - Administrator, Civil Service (Medical): No    Lack of Transportation (Non-Medical): No  Physical Activity: Sufficiently Active (07/03/2022)   Exercise Vital Sign    Days of Exercise per Week: 5 days    Minutes of Exercise per Session: 60 min  Stress: No Stress Concern Present (07/03/2022)   [employer] of Occupational Health - Occupational Stress Questionnaire    Feeling of Stress : Not at all  Social Connections: Moderately Isolated (07/03/2022)   Social Connection and Isolation Panel [NHANES]    Frequency of Communication with Friends and Family: More than three times a week    Frequency of Social Gatherings with Friends and Family: Three times a week    Attends Religious Services: Never    Active Member of Clubs or Organizations: No    Attends Banker  Meetings: Never    Marital Status: Married     Family History: The patient's family history includes Cancer in his mother; Diabetes in his brother and father; Heart disease in his sister; Rheumatic fever in his sister.  ROS:   Please see the history of present illness.     All other systems reviewed and are negative.  EKGs/Labs/Other Studies Reviewed:    The following studies were reviewed today:  Intraoperative TEE 09/2022 Complications: No known complications during this procedure.  POST-OP IMPRESSIONS  Overall, there were no significant changes from pre-bypass.  _ Left Ventricle: The left ventricle is unchanged from pre-bypass.  _ Right Ventricle: The right ventricle appears unchanged from pre-bypass.  _ Aorta: The aorta appears unchanged from pre-bypass.  _ Left Atrium: The left atrium appears unchanged from pre-bypass.  _ Left Atrial Appendage: The left atrial appendage appears unchanged from  pre-bypass.  _ Aortic Valve: The aortic valve appears unchanged from pre-bypass.  _ Mitral Valve: There is mild regurgitation.  _ Tricuspid Valve: There is trace regurgitation.  _ Pulmonic Valve: The pulmonic valve appears unchanged from pre-bypass.  _ Interatrial Septum: The interatrial septum appears unchanged from  pre-bypass.  _ Interventricular Septum: The interventricular septum appears unchanged  from  pre-bypass.  _ Pericardium: The pericardium appears unchanged from pre-bypass.   LHC 09/06/2022    Mid LM to Ost LAD lesion is 30% stenosed with 30% stenosed side branch in Ost Cx.   Mid LAD-1 lesion is 90% stenosed.   Mid LAD-2 lesion is 90% stenosed.   Dist LAD lesion is 90% stenosed.   Mid Cx to Dist Cx lesion is 70% stenosed.   2nd Diag lesion is 70% stenosed.   2nd Mrg lesion is 100% stenosed.   RPDA lesion is 80% stenosed.   RPAV lesion is 90% stenosed.   LV end diastolic pressure is normal.   No change in severe 3 vessel obstructive CAD Normal LVEDP IABP placed  for refractory angina   LHC 09/04/22 Conclusions: Severe three-vessel coronary artery disease, as detailed below.  Culprit lesion for the patient's NSTEMI is most likely occluded distal segment of OM1.  There are also sequential 90% stenoses of the  mid/distal LAD, 70% mid LCx lesion, and 80-90% ostial rPDA and proximal rPLA stenoses. Moderately to severely reduced left ventricular systolic function (LVEF 30-35%) with apical dyskinesis. Upper normal left ventricular filling pressure (LVEDP 15 mmHg).   Recommendations: Transfer to [facility] for cardiac surgery consultation for CABG. Restart heparin infusion 2 hours after TR band removal.  Defer addition of P2Y12 inhibitor pending cardiac surgery consultation. Aggressive secondary prevention of coronary artery disease. Maintain net even fluid balance and escalate goal-directed medical therapy for HFrEF as tolerated. Follow-up echocardiogram.   Yvonne Kendall, MD [facility]     Recommendations  Antiplatelet/Anticoag Continue aspirin 81 mg daily; restart heparin infusion 2 hours after TR band removal.  Defer addition of P2Y12 inhibitor pending cardiac surgery consultation.      EKG:  EKG is ordered today.  The ekg ordered today demonstrates NSR, 72bpm, RBBB, TWI lateral leads  Recent Labs: 10/10/2021: ALT 13 09/08/2022: Magnesium 2.3 09/11/2022: BUN 20; Creatinine, Ser 1.42; Hemoglobin 11.1; Platelets 247; Potassium 3.4; Sodium 134  Recent Lipid Panel    Component Value Date/Time   CHOL 147 09/04/2022 0300   CHOL 181 02/22/2022 1056   TRIG 177 (H) 09/04/2022 0300   HDL 32 (L) 09/04/2022 0300   HDL 34 (L) 02/22/2022 1056   CHOLHDL 4.6 09/04/2022 0300   VLDL 35 09/04/2022 0300   LDLCALC 80 09/04/2022 0300   LDLCALC 108 (H) 02/22/2022 1056    Physical Exam:    VS:  BP 106/60 (BP Location: Left Arm, Patient Position: Sitting)   Pulse 79   Ht 5\' 10"  (1.778 m)   Wt 181 lb (82.1 kg)   SpO2 94%   BMI 25.97 kg/m     Wt  Readings from Last 3 Encounters:  10/01/22 181 lb (82.1 kg)  09/12/22 183 lb 11.2 oz (83.3 kg)  09/03/22 188 lb 7.9 oz (85.5 kg)     GEN:  Well nourished, well developed in no acute distress HEENT: Normal NECK: No JVD; No carotid bruits LYMPHATICS: No lymphadenopathy CARDIAC: RRR, no murmurs, rubs, gallops RESPIRATORY:  Clear to auscultation without rales, wheezing or rhonchi  ABDOMEN: Soft, non-tender, non-distended MUSCULOSKELETAL:  No edema; No deformity  SKIN: Warm and dry NEUROLOGIC:  Alert and oriented x 3 PSYCHIATRIC:  Normal affect   ASSESSMENT:    1. NSTEMI (non-ST elevated myocardial infarction) (HCC)   2. Coronary artery disease involving native coronary artery of native heart without angina pectoris   3. s/p cabg x 5   4. Medication management   5. Atrial fibrillation, unspecified type (HCC)   6. Hyperlipidemia, mixed   7. Orthostasis   8. Paroxysmal atrial fibrillation (HCC)    PLAN:    In order of problems listed above:  NSTEMI CAD s/p CABG x5 Patient underwent CABG x 5 on 09/07/2022.  The patient is overall doing well.  He reports chest pain that is reproducible with palpation, suspect this will continue to improve. Activity level is slowly improving.  He has a follow-up with cardiothoracic surgery next week.  Continue aspirin 81 mg daily and Plavix 75 mg daily.  I will check a CBC today.  Continue Lopressor therapy.  He reports intolerance to statins, plan below. Surgical scar on exam today looks stable without infection.  We will consider cardiac rehab referral at follow-up.  HLD Patient reports intolerance to statins.  He is interested in [place].  I will refer him to the lipid clinic for further management.  Orthostatic symptoms Patient reports orthostatic symptoms in  the morning.  Blood pressure is normal today.  He is only taking Lopressor 12.5 mg twice daily.  No changes for now. We will evaluate at follow-up.  Post-op afib Patient had brief postop A-fib  started on amiodarone.  He was not started on a blood thinner at that time due to brief Afib in the post-op setting. CHA2DS2-VASc of at least 3 (agex2, PAD).  I will order a 2-week heart monitor to assess for recurrent A-fib.  Long-term plan is to come off amiodarone if there is no further A-fib, however we will continue amiodarone for now given fragile post-op state.  AKI Patient had AKI in the hospital.  Repeat BMET today.  Disposition: Follow up in 1 month(s) with MD/APP   Signed, Dierra Riesgo David Stall, PA-C  10/01/2022 10:04 AM    [facility]

## 2022-10-02 LAB — CBC
Hematocrit: 40.8 % (ref 37.5–51.0)
Hemoglobin: 13.7 g/dL (ref 13.0–17.7)
MCH: 31.8 pg (ref 26.6–33.0)
MCHC: 33.6 g/dL (ref 31.5–35.7)
MCV: 95 fL (ref 79–97)
Platelets: 261 10*3/uL (ref 150–450)
RBC: 4.31 x10E6/uL (ref 4.14–5.80)
RDW: 11.9 % (ref 11.6–15.4)
WBC: 8.1 10*3/uL (ref 3.4–10.8)

## 2022-10-02 LAB — BASIC METABOLIC PANEL WITH GFR
BUN/Creatinine Ratio: 10 (ref 10–24)
BUN: 14 mg/dL (ref 8–27)
CO2: 22 mmol/L (ref 20–29)
Calcium: 9.1 mg/dL (ref 8.6–10.2)
Chloride: 102 mmol/L (ref 96–106)
Creatinine, Ser: 1.36 mg/dL — ABNORMAL HIGH (ref 0.76–1.27)
Glucose: 154 mg/dL — ABNORMAL HIGH (ref 70–99)
Potassium: 4.5 mmol/L (ref 3.5–5.2)
Sodium: 138 mmol/L (ref 134–144)
eGFR: 54 mL/min/{1.73_m2} — ABNORMAL LOW

## 2022-10-04 DIAGNOSIS — I4891 Unspecified atrial fibrillation: Secondary | ICD-10-CM | POA: Diagnosis not present

## 2022-10-09 ENCOUNTER — Ambulatory Visit (INDEPENDENT_AMBULATORY_CARE_PROVIDER_SITE_OTHER): Payer: HMO | Admitting: Physician Assistant

## 2022-10-09 ENCOUNTER — Encounter: Payer: Self-pay | Admitting: Physician Assistant

## 2022-10-09 ENCOUNTER — Ambulatory Visit: Admission: RE | Admit: 2022-10-09 | Payer: HMO | Source: Ambulatory Visit

## 2022-10-09 ENCOUNTER — Other Ambulatory Visit: Payer: Self-pay | Admitting: Surgery

## 2022-10-09 VITALS — BP 87/56 | HR 76 | Resp 20 | Ht 70.0 in | Wt 185.0 lb

## 2022-10-09 DIAGNOSIS — Z951 Presence of aortocoronary bypass graft: Secondary | ICD-10-CM | POA: Diagnosis not present

## 2022-10-09 NOTE — Progress Notes (Signed)
HPI: [name] is a 77 year old gentleman with past history notable for stage III chronic kidney disease and statin intolerance who recently presented with acute non-ST elevation myocardial infarction with LV ejection fraction of around 35%.  He underwent CABG x 5 by Dr. Lavinia Sharps on 09/07/2022.  He had postoperative atrial fibrillation successfully converted back to sinus rhythm on amiodarone.  He was discharged from the hospital on postop day 5 in stable condition.  He was seen by cardiology in follow-up about 1 week ago.  At that time, he was making expected postoperative progress.  A 2-week heart monitor was ordered to evaluate for paroxysmal atrial fibrillation.  He was also referred to the lipid clinic for his history of statin intolerance.  He returns today for scheduled follow-up.  Since hospital discharge the patient reports frequent dizziness when he stands from a sitting position.  He has also had some mild shortness of breath with activity but reports this is improving gradually.  He is having minimal parasternal pain and not requiring any analgesics.   Current Outpatient Medications  Medication Sig Dispense Refill   acetaminophen (TYLENOL) 325 MG tablet Take 650 mg by mouth every 6 (six) hours as needed for moderate pain or headache.     amiodarone (PACERONE) 200 MG tablet Take 1 tablet (200 mg total) by mouth 2 (two) times daily. X 7 days, then decrease to 200 mg daily 60 tablet 0   aspirin EC 81 MG tablet Take 1 tablet (81 mg total) by mouth daily. Swallow whole. 30 tablet 12   atorvastatin (LIPITOR) 80 MG tablet Take 1 tablet (80 mg total) by mouth daily. (Patient not taking: Reported on 10/01/2022)     clopidogrel (PLAVIX) 75 MG tablet Take 1 tablet (75 mg total) by mouth daily. 30 tablet 3   imipramine (TOFRANIL) 50 MG tablet Take 50 mg by mouth at bedtime.     metoprolol tartrate (LOPRESSOR) 25 MG tablet Take 0.5 tablets (12.5 mg total) by mouth 2 (two) times daily. 30 tablet 3    tamsulosin (FLOMAX) 0.4 MG CAPS capsule Take 0.4 mg by mouth daily after supper.      traMADol (ULTRAM) 50 MG tablet Take 1 tablet (50 mg total) by mouth every 6 (six) hours as needed for moderate pain. 30 tablet 0   No current facility-administered medications for this visit.    Physical Exam: Vital signs BP 87/56 Pulse 76 Respirations 20 SpO2 96% on room air  General: Rodney Hall appears well.  He is walking with a steady gait and is in no distress.  He is accompanied by his wife today Skin: Warm and dry Heart: Regular rate and rhythm.  No murmur. Chest: Breath sounds are full, clear, and equal.  The sternotomy incision is well-healed.  He had some thick scabs over the mediastinal and left chest tube insertion sites.  These were debrided.  The chest x-ray obtained earlier today was reviewed and shows no effusions, normal heart size, and clear lung fields.   Extremities: No edema.  The right lower extremity EVH incision is healing with no sign of complication  Diagnostic Tests: CLINICAL DATA:  CABG, follow-up   EXAM: CHEST - 2 VIEW   COMPARISON:  7024   FINDINGS: No focal consolidation. No pleural effusion or pneumothorax. Heart and mediastinal contours are unremarkable. Prior CABG.   No acute osseous abnormality.   IMPRESSION: No active cardiopulmonary disease.     Electronically Signed   By: Dillard Cannon.D.  On: 10/09/2022 13:19  Impression / Plan: Rodney Hall is making him progressive and satisfactory recovery following coronary bypass grafting.  The postoperative TEE showed well-preserved LV function.  BP in the office today was 87/56 and he reports having what sounds to be orthostatic hypotension frequently at home.  He is not had any syncopal episodes but has frequent dizziness with changing positions.  I recommended he stop taking the metoprolol for now.  He currently is being evaluated for paroxysmal A-fib with a long-term cardiac monitor.  If this shows no  evidence of atrial fibrillation, hopefully he can come off of the amiodarone and at that time restart a low-dose beta-blocker.  Agree with referral to the lipid clinic since he has his long standing history of statin intolerance. We reviewed sternal precautions.  He would like to return to the gym to do low impact cycling exercises.  I think this would be appropriate at this stage.  He may resume driving.  Avoid lifting greater than 15 pounds for another 6 weeks. Follow-up with Dr. Lavinia Sharps in 3 weeks.  Leary Roca, PA-C [facility] [phone]

## 2022-10-09 NOTE — Patient Instructions (Signed)
You may resume driving.  Up taking the metoprolol for now.  This may be resumed in the future after the amiodarone is discontinued.  Continue to observe sternal precautions with no lifting greater than 15 pounds for another 6 weeks.  After that, you may gradually advance your activity without limitation.  Follow-up with Dr. Lavinia Sharps in 3-4 weeks.

## 2022-10-10 ENCOUNTER — Other Ambulatory Visit: Payer: Self-pay

## 2022-10-10 ENCOUNTER — Encounter: Payer: HMO | Attending: Cardiovascular Disease

## 2022-10-10 DIAGNOSIS — Z48812 Encounter for surgical aftercare following surgery on the circulatory system: Secondary | ICD-10-CM | POA: Insufficient documentation

## 2022-10-10 DIAGNOSIS — Z951 Presence of aortocoronary bypass graft: Secondary | ICD-10-CM | POA: Insufficient documentation

## 2022-10-10 NOTE — Progress Notes (Signed)
Virtual Visit completed. Patient informed on EP and RD appointment and 6 Minute walk test. Patient also informed of patient health questionnaires on My Chart. Patient Verbalizes understanding. Visit diagnosis can be found in [facility] 09/10/2022.

## 2022-10-11 ENCOUNTER — Ambulatory Visit (INDEPENDENT_AMBULATORY_CARE_PROVIDER_SITE_OTHER): Payer: HMO | Admitting: Family Medicine

## 2022-10-11 VITALS — BP 120/73 | HR 91 | Temp 97.5°F | Ht 70.0 in | Wt 185.0 lb

## 2022-10-11 DIAGNOSIS — Z125 Encounter for screening for malignant neoplasm of prostate: Secondary | ICD-10-CM

## 2022-10-11 DIAGNOSIS — Z951 Presence of aortocoronary bypass graft: Secondary | ICD-10-CM

## 2022-10-11 DIAGNOSIS — Z0000 Encounter for general adult medical examination without abnormal findings: Secondary | ICD-10-CM | POA: Diagnosis not present

## 2022-10-11 DIAGNOSIS — I2585 Chronic coronary microvascular dysfunction: Secondary | ICD-10-CM | POA: Diagnosis not present

## 2022-10-11 DIAGNOSIS — R7303 Prediabetes: Secondary | ICD-10-CM

## 2022-10-11 DIAGNOSIS — N1832 Chronic kidney disease, stage 3b: Secondary | ICD-10-CM

## 2022-10-11 DIAGNOSIS — E781 Pure hyperglyceridemia: Secondary | ICD-10-CM | POA: Diagnosis not present

## 2022-10-11 MED ORDER — SILVER SULFADIAZINE 1 % EX CREA: 1.0000 | TOPICAL_CREAM | Freq: Every day | CUTANEOUS | 1 refills | Status: DC

## 2022-10-11 NOTE — Patient Instructions (Signed)
Please review the attached list of medications and notify my office if there are any errors.   I recommend getting the RSV vaccine (Arexvy) sometime in the next few months.

## 2022-10-14 VITALS — Ht 71.5 in | Wt 184.9 lb

## 2022-10-14 DIAGNOSIS — Z951 Presence of aortocoronary bypass graft: Secondary | ICD-10-CM | POA: Diagnosis present

## 2022-10-14 DIAGNOSIS — Z48812 Encounter for surgical aftercare following surgery on the circulatory system: Secondary | ICD-10-CM | POA: Diagnosis not present

## 2022-10-14 NOTE — Progress Notes (Signed)
Cardiac Individual Treatment Plan  Patient Details  Name: Rodney Hall. MRN: [id number] Date of Birth: 1945-11-05 Referring Provider:   Flowsheet Row Cardiac Rehab from 10/14/2022 in [facility] Cardiac and Pulmonary Rehab  Referring Provider Dr. Julien Nordmann       Initial Encounter Date:  Flowsheet Row Cardiac Rehab from 10/14/2022 in [facility] Cardiac and Pulmonary Rehab  Date 10/14/22       Visit Diagnosis: S/P CABG x 5  Patient's Home Medications on Admission:  Current Outpatient Medications:    acetaminophen (TYLENOL) 325 MG tablet, Take 650 mg by mouth every 6 (six) hours as needed for moderate pain or headache., Disp: , Rfl:    amiodarone (PACERONE) 200 MG tablet, Take 1 tablet (200 mg total) by mouth 2 (two) times daily. X 7 days, then decrease to 200 mg daily, Disp: 60 tablet, Rfl: 0   aspirin EC 81 MG tablet, Take 1 tablet (81 mg total) by mouth daily. Swallow whole., Disp: 30 tablet, Rfl: 12   atorvastatin (LIPITOR) 80 MG tablet, Take 1 tablet (80 mg total) by mouth daily. (Patient not taking: Reported on 10/10/2022), Disp: , Rfl:    clopidogrel (PLAVIX) 75 MG tablet, Take 1 tablet (75 mg total) by mouth daily., Disp: 30 tablet, Rfl: 3   Diphenhydramine-APAP 12.5-500 MG TABS, , Disp: , Rfl:    imipramine (TOFRANIL) 50 MG tablet, Take 50 mg by mouth at bedtime., Disp: , Rfl:    silver sulfADIAZINE (SILVADENE) 1 % cream, Apply 1 Application topically daily., Disp: 25 g, Rfl: 1   tamsulosin (FLOMAX) 0.4 MG CAPS capsule, Take 0.4 mg by mouth daily after supper. , Disp: , Rfl:    traMADol (ULTRAM) 50 MG tablet, Take 1 tablet (50 mg total) by mouth every 6 (six) hours as needed for moderate pain., Disp: 30 tablet, Rfl: 0  Past Medical History: Past Medical History:  Diagnosis Date   Abdominal mass    Abdominal pain 06/02/2017   Anxiety    Pt. denies   Arthritis    pt. 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    Tobacco Use: Social History   Tobacco Use  Smoking Status Never  Smokeless Tobacco Never    Labs: Review Flowsheet  More data exists      Latest Ref Rng & Units 10/10/2021 02/22/2022 09/04/2022 09/06/2022 09/07/2022  Labs for ITP Cardiac and Pulmonary Rehab  Cholestrol 0 - 200 mg/dL 865  784  696  - -  LDL (calc) 0 - 99 mg/dL 295  284  80  - -  HDL-C >40 mg/dL 29  34  32  - -  Trlycerides <150 mg/dL 132  440  102  - -  Hemoglobin A1c 4.8 - 5.6 % 6.5  6.3  - 5.7  -  PH, Arterial 7.35 - 7.45 - - - - 7.345  7.362  7.343  7.363  7.377  7.322  7.357  7.428   PCO2 arterial 32 - 48 mmHg - - - - 37.9  35.8  38.9  39.3  37.8  46.0  38.3  33.7  Bicarbonate 20.0 - 28.0 mmol/L - - - - 20.8  20.3  21.1  22.4  22.2  24.2  23.8  21.5  22.3   TCO2 22 - 32 mmol/L - - - - 22  21  24  22  24  24  23  23  26  25  26  24  23  23    Acid-base deficit 0.0 - 2.0 mmol/L - - - - 5.0  5.0  4.0  3.0  3.0  2.0  2.0  4.0  2.0   O2 Saturation % - - - - 97  96  99  100  100  82  100  100  91     Details       Multiple values from one day are sorted in reverse-chronological order          Exercise Target Goals: Exercise Program Goal: Individual exercise prescription set using results from initial 6 min walk test and THRR while considering  patient's activity barriers and safety.   Exercise Prescription Goal: Initial exercise prescription builds to 30-45 minutes a day of aerobic activity, 2-3 days per week.  Home exercise guidelines will be given to patient during program as part of exercise prescription that the participant will acknowledge.   Education: Aerobic Exercise: - Group verbal and visual presentation on the  components of exercise prescription. Introduces F.I.T.T principle from ACSM for exercise prescriptions.  Reviews F.I.T.T. principles of aerobic exercise including progression. Written material given at graduation. Flowsheet Row Cardiac Rehab from 10/14/2022 in [facility] Cardiac and Pulmonary Rehab  Education need identified 10/14/22       Education: Resistance Exercise: - Group verbal and visual presentation on the components of exercise prescription. Introduces F.I.T.T principle from ACSM for exercise prescriptions  Reviews F.I.T.T. principles of resistance exercise including progression. Written material given at graduation. Flowsheet Row Cardiac Rehab from 10/14/2022 in [facility] Cardiac and Pulmonary Rehab  Education need identified 10/14/22        Education: Exercise & Equipment Safety: - Individual verbal instruction and demonstration of equipment use and safety with use of the equipment. Flowsheet Row Cardiac Rehab from 10/14/2022 in [facility] Cardiac and Pulmonary Rehab  Date 10/14/22  Educator MB  Instruction Review Code 1- Verbalizes Understanding       Education: Exercise Physiology & General Exercise Guidelines: - Group verbal and written instruction with models to review the exercise physiology of the cardiovascular system and associated critical values. Provides general exercise guidelines with specific guidelines to those with heart or lung disease.  Flowsheet Row Cardiac Rehab from 10/14/2022 in [facility] Cardiac and Pulmonary Rehab  Education need identified 10/14/22       Education: Flexibility, Balance, Mind/Body Relaxation: - Group verbal and visual presentation with interactive activity on the components of exercise prescription. Introduces F.I.T.T principle from ACSM for exercise prescriptions. Reviews F.I.T.T. principles of flexibility and balance exercise training including progression. Also discusses the mind body connection.  Reviews various relaxation techniques to help reduce  and manage stress (i.e. Deep breathing, progressive muscle relaxation, and visualization). Balance handout provided to take home. Written material given at graduation.   Activity Barriers & Risk Stratification:  Activity Barriers & Cardiac Risk Stratification - 10/14/22 1615       Activity Barriers & Cardiac Risk Stratification   Activity Barriers Back Problems;Balance Concerns;History of Falls;Other (comment)    Comments Dizziness    Cardiac Risk Stratification High  6 Minute Walk:  6 Minute Walk     Row Name 10/14/22 1612         6 Minute Walk   Phase Initial     Distance 1090 feet     Walk Time 6 minutes     # of Rest Breaks 0     MPH 2.06     METS 2.24     RPE 9     Perceived Dyspnea  0     VO2 Peak 7.82     Symptoms Yes (comment)     Comments Back pain 3/10     Resting HR 83 bpm     Resting BP 110/64     Resting Oxygen Saturation  98 %     Exercise Oxygen Saturation  during 6 min walk 97 %     Max Ex. HR 104 bpm     Max Ex. BP 110/62     2 Minute Post BP 100/72              Oxygen Initial Assessment:   Oxygen Re-Evaluation:   Oxygen Discharge (Final Oxygen Re-Evaluation):   Initial Exercise Prescription:  Initial Exercise Prescription - 10/14/22 1600       Date of Initial Exercise RX and Referring Provider   Date 10/14/22    Referring Provider Dr. Julien Nordmann      Oxygen   Maintain Oxygen Saturation 88% or higher      Recumbant Bike   Level 2    RPM 50    Minutes 15      NuStep   Level 2    SPM 80    Minutes 15      T5 Nustep   Level 1    SPM 80    Minutes 15      Prescription Details   Frequency (times per week) 2    Duration Progress to 30 minutes of continuous aerobic without signs/symptoms of physical distress      Intensity   THRR 40-80% of Max Heartrate 57-114    Ratings of Perceived Exertion 11-13    Perceived Dyspnea 0-4      Progression   Progression Continue to progress workloads to maintain  intensity without signs/symptoms of physical distress.      Resistance Training   Training Prescription Yes    Weight 5lbs             Perform Capillary Blood Glucose checks as needed.  Exercise Prescription Changes:   Exercise Prescription Changes     Row Name 10/14/22 1600             Response to Exercise   Blood Pressure (Admit) 110/64       Blood Pressure (Exercise) 110/62       Blood Pressure (Exit) 100/72       Heart Rate (Admit) 83 bpm       Heart Rate (Exercise) 104 bpm       Heart Rate (Exit) 90 bpm       Oxygen Saturation (Admit) 98 %       Oxygen Saturation (Exercise) 97 %       Oxygen Saturation (Exit) 97 %       Rating of Perceived Exertion (Exercise) 9       Perceived Dyspnea (Exercise) 0       Symptoms Back pain       Comments Results  Exercise Comments:   Exercise Goals and Review:   Exercise Goals     Row Name 10/14/22 1647             Exercise Goals   Increase Physical Activity Yes       Intervention Provide advice, education, support and counseling about physical activity/exercise needs.;Develop an individualized exercise prescription for aerobic and resistive training based on initial evaluation findings, risk stratification, comorbidities and participant's personal goals.       Expected Outcomes Short Term: Attend rehab on a regular basis to increase amount of physical activity.;Long Term: Exercising regularly at least 3-5 days a week.;Long Term: Add in home exercise to make exercise part of routine and to increase amount of physical activity.       Increase Strength and Stamina Yes       Intervention Provide advice, education, support and counseling about physical activity/exercise needs.;Develop an individualized exercise prescription for aerobic and resistive training based on initial evaluation findings, risk stratification, comorbidities and participant's personal goals.       Expected Outcomes Short Term:  Increase workloads from initial exercise prescription for resistance, speed, and METs.;Short Term: Perform resistance training exercises routinely during rehab and add in resistance training at home;Long Term: Improve cardiorespiratory fitness, muscular endurance and strength as measured by increased METs and functional capacity ( )       Able to understand and use rate of perceived exertion (RPE) scale Yes       Intervention Provide education and explanation on how to use RPE scale       Expected Outcomes Short Term: Able to use RPE daily in rehab to express subjective intensity level;Long Term:  Able to use RPE to guide intensity level when exercising independently       Able to understand and use Dyspnea scale Yes       Intervention Provide education and explanation on how to use Dyspnea scale       Expected Outcomes Short Term: Able to use Dyspnea scale daily in rehab to express subjective sense of shortness of breath during exertion;Long Term: Able to use Dyspnea scale to guide intensity level when exercising independently       Knowledge and understanding of Target Heart Rate Range (THRR) Yes       Intervention Provide education and explanation of THRR including how the numbers were predicted and where they are located for reference       Expected Outcomes Short Term: Able to state/look up THRR;Long Term: Able to use THRR to govern intensity when exercising independently;Short Term: Able to use daily as guideline for intensity in rehab       Able to check pulse independently Yes       Intervention Provide education and demonstration on how to check pulse in carotid and radial arteries.;Review the importance of being able to check your own pulse for safety during independent exercise       Expected Outcomes Short Term: Able to explain why pulse checking is important during independent exercise;Long Term: Able to check pulse independently and accurately       Understanding of Exercise  Prescription Yes       Intervention Provide education, explanation, and written materials on patient's individual exercise prescription       Expected Outcomes Short Term: Able to explain program exercise prescription;Long Term: Able to explain home exercise prescription to exercise independently                Exercise  Goals Re-Evaluation :   Discharge Exercise Prescription (Final Exercise Prescription Changes):  Exercise Prescription Changes - 10/14/22 1600       Response to Exercise   Blood Pressure (Admit) 110/64    Blood Pressure (Exercise) 110/62    Blood Pressure (Exit) 100/72    Heart Rate (Admit) 83 bpm    Heart Rate (Exercise) 104 bpm    Heart Rate (Exit) 90 bpm    Oxygen Saturation (Admit) 98 %    Oxygen Saturation (Exercise) 97 %    Oxygen Saturation (Exit) 97 %    Rating of Perceived Exertion (Exercise) 9    Perceived Dyspnea (Exercise) 0    Symptoms Back pain    Comments Results             Nutrition:  Target Goals: Understanding of nutrition guidelines, daily intake of sodium 1500mg , cholesterol 200mg , calories 30% from fat and 7% or less from saturated fats, daily to have 5 or more servings of fruits and vegetables.  Education: All About Nutrition: -Group instruction provided by verbal, written material, interactive activities, discussions, models, and posters to present general guidelines for heart healthy nutrition including fat, fiber, MyPlate, the role of sodium in heart healthy nutrition, utilization of the nutrition label, and utilization of this knowledge for meal planning. Follow up email sent as well. Written material given at graduation. Flowsheet Row Cardiac Rehab from 10/14/2022 in [facility] Cardiac and Pulmonary Rehab  Education need identified 10/14/22       Biometrics:  Pre Biometrics - 10/14/22 1647       Pre Biometrics   Height 5' 11.5" (1.816 m)    Weight 184 lb 14.4 oz (83.9 kg)    Waist Circumference 40.5 inches    Hip  Circumference 40.5 inches    Waist to Hip Ratio 1 %    BMI (Calculated) 25.43    Single Leg Stand 2 seconds              Nutrition Therapy Plan and Nutrition Goals:  Nutrition Therapy & Goals - 10/14/22 1650       Nutrition Therapy   RD appointment deferred Yes      Intervention Plan   Intervention Prescribe, educate and counsel regarding individualized specific dietary modifications aiming towards targeted core components such as weight, hypertension, lipid management, diabetes, heart failure and other comorbidities.    Expected Outcomes Short Term Goal: A plan has been developed with personal nutrition goals set during dietitian appointment.;Long Term Goal: Adherence to prescribed nutrition plan.;Short Term Goal: Understand basic principles of dietary content, such as calories, fat, sodium, cholesterol and nutrients.             Nutrition Assessments:  MEDIFICTS Score Key: ?70 Need to make dietary changes  40-70 Heart Healthy Diet ? 40 Therapeutic Level Cholesterol Diet  Flowsheet Row Cardiac Rehab from 10/14/2022 in [facility] Cardiac and Pulmonary Rehab  Picture Your Plate Total Score on Admission 76      Picture Your Plate Scores: <16 Unhealthy dietary pattern with much room for improvement. 41-50 Dietary pattern unlikely to meet recommendations for good health and room for improvement. 51-60 More healthful dietary pattern, with some room for improvement.  >60 Healthy dietary pattern, although there may be some specific behaviors that could be improved.    Nutrition Goals Re-Evaluation:   Nutrition Goals Discharge (Final Nutrition Goals Re-Evaluation):   Psychosocial: Target Goals: Acknowledge presence or absence of significant depression and/or stress, maximize coping skills, provide positive support system.  Participant is able to verbalize types and ability to use techniques and skills needed for reducing stress and depression.   Education: Stress, Anxiety,  and Depression - Group verbal and visual presentation to define topics covered.  Reviews how body is impacted by stress, anxiety, and depression.  Also discusses healthy ways to reduce stress and to treat/manage anxiety and depression.  Written material given at graduation.   Education: Sleep Hygiene -Provides group verbal and written instruction about how sleep can affect your health.  Define sleep hygiene, discuss sleep cycles and impact of sleep habits. Review good sleep hygiene tips.    Initial Review & Psychosocial Screening:  Initial Psych Review & Screening - 10/10/22 1111       Initial Review   Current issues with None Identified      Family Dynamics   Good Support System? Yes    Comments He can look to his wife for support. Now that his health is back under control he does not have any stressors or deprression.      Barriers   Psychosocial barriers to participate in program The patient should benefit from training in stress management and relaxation.;There are no identifiable barriers or psychosocial needs.      Screening Interventions   Interventions Encouraged to exercise;To provide support and resources with identified psychosocial needs;Provide feedback about the scores to participant    Expected Outcomes Short Term goal: Utilizing psychosocial counselor, staff and physician to assist with identification of specific Stressors or current issues interfering with healing process. Setting desired goal for each stressor or current issue identified.;Long Term Goal: Stressors or current issues are controlled or eliminated.;Short Term goal: Identification and review with participant of any Quality of Life or Depression concerns found by scoring the questionnaire.;Long Term goal: The participant improves quality of Life and PHQ9 Scores as seen by post scores and/or verbalization of changes             Quality of Life Scores:   Quality of Life - 10/14/22 1652       Quality of  Life   Select Quality of Life      Quality of Life Scores   Health/Function Pre 19.12 %    Socioeconomic Pre 27.92 %    Psych/Spiritual Pre 28.07 %    Family Pre 30 %    GLOBAL Pre 24.6 %            Scores of 19 and below usually indicate a poorer quality of life in these areas.  A difference of  2-3 points is a clinically meaningful difference.  A difference of 2-3 points in the total score of the Quality of Life Index has been associated with significant improvement in overall quality of life, self-image, physical symptoms, and general health in studies assessing change in quality of life.  PHQ-9: Review Flowsheet  More data exists      10/14/2022 10/11/2022 07/03/2022 10/10/2021 10/09/2021  Depression screen PHQ 2/9  Decreased Interest 0 0 0 0 0 0  Down, Depressed, Hopeless 0 0 0 0 0 0  PHQ - 2 Score 0 0 0 0 0 0  Altered sleeping 1 0 - - 0  Tired, decreased energy 0 0 - - 0  Change in appetite 0 0 - - 0  Feeling bad or failure about yourself  0 0 - - 0  Trouble concentrating 0 1 - - 0  Moving slowly or fidgety/restless 0 0 - - 0  Suicidal thoughts 0 0 - -  0  PHQ-9 Score 1 1 - - 0  Difficult doing work/chores - Not difficult at all - - Not difficult at all    Details       Multiple values from one day are sorted in reverse-chronological order        Interpretation of Total Score  Total Score Depression Severity:  1-4 = Minimal depression, 5-9 = Mild depression, 10-14 = Moderate depression, 15-19 = Moderately severe depression, 20-27 = Severe depression   Psychosocial Evaluation and Intervention:  Psychosocial Evaluation - 10/10/22 1112       Psychosocial Evaluation & Interventions   Interventions Encouraged to exercise with the program and follow exercise prescription;Relaxation education;Stress management education    Comments He can look to his wife for support. Now that his health is back under control he does not have any stressors or deprression.    Expected  Outcomes Short: Start [employer] to help with mood. Long: Maintain a healthy mental state    Continue Psychosocial Services  Follow up required by staff             Psychosocial Re-Evaluation:   Psychosocial Discharge (Final Psychosocial Re-Evaluation):   Vocational Rehabilitation: Provide vocational rehab assistance to qualifying candidates.   Vocational Rehab Evaluation & Intervention:   Education: Education Goals: Education classes will be provided on a variety of topics geared toward better understanding of heart health and risk factor modification. Participant will state understanding/return demonstration of topics presented as noted by education test scores.  Learning Barriers/Preferences:  Learning Barriers/Preferences - 10/10/22 1110       Learning Barriers/Preferences   Learning Barriers None    Learning Preferences None             General Cardiac Education Topics:  AED/CPR: - Group verbal and written instruction with the use of models to demonstrate the basic use of the AED with the basic ABC's of resuscitation.   Anatomy and Cardiac Procedures: - Group verbal and visual presentation and models provide information about basic cardiac anatomy and function. Reviews the testing methods done to diagnose heart disease and the outcomes of the test results. Describes the treatment choices: Medical Management, Angioplasty, or Coronary Bypass Surgery for treating various heart conditions including Myocardial Infarction, Angina, Valve Disease, and Cardiac Arrhythmias.  Written material given at graduation. Flowsheet Row Cardiac Rehab from 10/14/2022 in [facility] Cardiac and Pulmonary Rehab  Education need identified 10/14/22       Medication Safety: - Group verbal and visual instruction to review commonly prescribed medications for heart and lung disease. Reviews the medication, class of the drug, and side effects. Includes the steps to properly store meds and maintain  the prescription regimen.  Written material given at graduation.   Intimacy: - Group verbal instruction through game format to discuss how heart and lung disease can affect sexual intimacy. Written material given at graduation..   Know Your Numbers and Heart Failure: - Group verbal and visual instruction to discuss disease risk factors for cardiac and pulmonary disease and treatment options.  Reviews associated critical values for Overweight/Obesity, Hypertension, Cholesterol, and Diabetes.  Discusses basics of heart failure: signs/symptoms and treatments.  Introduces Heart Failure Zone chart for action plan for heart failure.  Written material given at graduation. Flowsheet Row Cardiac Rehab from 10/14/2022 in [facility] Cardiac and Pulmonary Rehab  Education need identified 10/14/22       Infection Prevention: - Provides verbal and written material to individual with discussion of infection control including proper hand  washing and proper equipment cleaning during exercise session. Flowsheet Row Cardiac Rehab from 10/14/2022 in [facility] Cardiac and Pulmonary Rehab  Date 10/14/22  Educator MB  Instruction Review Code 1- Verbalizes Understanding       Falls Prevention: - Provides verbal and written material to individual with discussion of falls prevention and safety. Flowsheet Row Cardiac Rehab from 10/14/2022 in [facility] Cardiac and Pulmonary Rehab  Date 10/14/22  Educator MB  Instruction Review Code 1- Verbalizes Understanding       Other: -Provides group and verbal instruction on various topics (see comments)   Knowledge Questionnaire Score:  Knowledge Questionnaire Score - 10/14/22 1654       Knowledge Questionnaire Score   Pre Score 20/26             Core Components/Risk Factors/Patient Goals at Admission:  Personal Goals and Risk Factors at Admission - 10/14/22 1658       Core Components/Risk Factors/Patient Goals on Admission    Weight Management Yes;Weight Maintenance     Intervention Weight Management: Develop a combined nutrition and exercise program designed to reach desired caloric intake, while maintaining appropriate intake of nutrient and fiber, sodium and fats, and appropriate energy expenditure required for the weight goal.;Weight Management: Provide education and appropriate resources to help participant work on and attain dietary goals.;Weight Management/Obesity: Establish reasonable short term and long term weight goals.    Admit Weight 184 lb 14.4 oz (83.9 kg)    Goal Weight: Short Term 184 lb (83.5 kg)    Goal Weight: Long Term 184 lb (83.5 kg)    Expected Outcomes Short Term: Continue to assess and modify interventions until short term weight is achieved;Long Term: Adherence to nutrition and physical activity/exercise program aimed toward attainment of established weight goal;Weight Maintenance: Understanding of the daily nutrition guidelines, which includes 25-35% calories from fat, 7% or less cal from saturated fats, less than 200mg  cholesterol, less than 1.5gm of sodium, & 5 or more servings of fruits and vegetables daily;Understanding recommendations for meals to include 15-35% energy as protein, 25-35% energy from fat, 35-60% energy from carbohydrates, less than 200mg  of dietary cholesterol, 20-35 gm of total fiber daily;Understanding of distribution of calorie intake throughout the day with the consumption of 4-5 meals/snacks    Heart Failure Yes    Intervention Provide a combined exercise and nutrition program that is supplemented with education, support and counseling about heart failure. Directed toward relieving symptoms such as shortness of breath, decreased exercise tolerance, and extremity edema.    Expected Outcomes Improve functional capacity of life;Short term: Attendance in program 2-3 days a week with increased exercise capacity. Reported lower sodium intake. Reported increased fruit and vegetable intake. Reports medication  compliance.;Short term: Daily weights obtained and reported for increase. Utilizing diuretic protocols set by physician.;Long term: Adoption of self-care skills and reduction of barriers for early signs and symptoms recognition and intervention leading to self-care maintenance.    Lipids Yes    Intervention Provide education and support for participant on nutrition & aerobic/resistive exercise along with prescribed medications to achieve LDL 70mg , HDL >40mg .    Expected Outcomes Short Term: Participant states understanding of desired cholesterol values and is compliant with medications prescribed. Participant is following exercise prescription and nutrition guidelines.;Long Term: Cholesterol controlled with medications as prescribed, with individualized exercise RX and with personalized nutrition plan. Value goals: LDL < 70mg , HDL > 40 mg.             Education:Diabetes - Individual verbal and  written instruction to review signs/symptoms of diabetes, desired ranges of glucose level fasting, after meals and with exercise. Acknowledge that pre and post exercise glucose checks will be done for 3 sessions at entry of program.   Core Components/Risk Factors/Patient Goals Review:    Core Components/Risk Factors/Patient Goals at Discharge (Final Review):    ITP Comments:  ITP Comments     Row Name 10/10/22 1110 10/14/22 1611         ITP Comments Virtual Visit completed. Patient informed on EP and RD appointment and 6 Minute walk test. Patient also informed of patient health questionnaires on My Chart. Patient Verbalizes understanding. Visit diagnosis can be found in [facility] 09/10/2022. Completed and gym orientation. Initial ITP created and sent for review to Dr. Bethann Punches, Medical Director.               Comments: Initial ITP

## 2022-10-14 NOTE — Patient Instructions (Signed)
Patient Instructions  Patient Details  Name: Rodney Hall. MRN: [id number] Date of Birth: 1945-07-26 Referring Provider:  Antonieta Iba, MD  Below are your personal goals for exercise, nutrition, and risk factors. Our goal is to help you stay on track towards obtaining and maintaining these goals. We will be discussing your progress on these goals with you throughout the program.  Initial Exercise Prescription:  Initial Exercise Prescription - 10/14/22 1600       Date of Initial Exercise RX and Referring Provider   Date 10/14/22    Referring Provider Dr. Julien Nordmann      Oxygen   Maintain Oxygen Saturation 88% or higher      Recumbant Bike   Level 2    RPM 50    Minutes 15      NuStep   Level 2    SPM 80    Minutes 15      T5 Nustep   Level 1    SPM 80    Minutes 15      Prescription Details   Frequency (times per week) 2    Duration Progress to 30 minutes of continuous aerobic without signs/symptoms of physical distress      Intensity   THRR 40-80% of Max Heartrate 57-114    Ratings of Perceived Exertion 11-13    Perceived Dyspnea 0-4      Progression   Progression Continue to progress workloads to maintain intensity without signs/symptoms of physical distress.      Resistance Training   Training Prescription Yes    Weight 5lbs             Exercise Goals: Frequency: Be able to perform aerobic exercise two to three times per week in program working toward 2-5 days per week of home exercise.  Intensity: Work with a perceived exertion of 11 (fairly light) - 15 (hard) while following your exercise prescription.  We will make changes to your prescription with you as you progress through the program.   Duration: Be able to do 30 to 45 minutes of continuous aerobic exercise in addition to a 5 minute warm-up and a 5 minute cool-down routine.   Nutrition Goals: Your personal nutrition goals will be established when you do your nutrition analysis  with the dietician.  The following are general nutrition guidelines to follow: Cholesterol < 200mg /day Sodium < 1500mg /day Fiber: Men over 50 yrs - 30 grams per day  Personal Goals:  Personal Goals and Risk Factors at Admission - 10/14/22 1658       Core Components/Risk Factors/Patient Goals on Admission    Weight Management Yes;Weight Maintenance    Intervention Weight Management: Develop a combined nutrition and exercise program designed to reach desired caloric intake, while maintaining appropriate intake of nutrient and fiber, sodium and fats, and appropriate energy expenditure required for the weight goal.;Weight Management: Provide education and appropriate resources to help participant work on and attain dietary goals.;Weight Management/Obesity: Establish reasonable short term and long term weight goals.    Admit Weight 184 lb 14.4 oz (83.9 kg)    Goal Weight: Short Term 184 lb (83.5 kg)    Goal Weight: Long Term 184 lb (83.5 kg)    Expected Outcomes Short Term: Continue to assess and modify interventions until short term weight is achieved;Long Term: Adherence to nutrition and physical activity/exercise program aimed toward attainment of established weight goal;Weight Maintenance: Understanding of the daily nutrition guidelines, which includes 25-35% calories from fat, 7% or  less cal from saturated fats, less than 200mg  cholesterol, less than 1.5gm of sodium, & 5 or more servings of fruits and vegetables daily;Understanding recommendations for meals to include 15-35% energy as protein, 25-35% energy from fat, 35-60% energy from carbohydrates, less than 200mg  of dietary cholesterol, 20-35 gm of total fiber daily;Understanding of distribution of calorie intake throughout the day with the consumption of 4-5 meals/snacks    Heart Failure Yes    Intervention Provide a combined exercise and nutrition program that is supplemented with education, support and counseling about heart failure.  Directed toward relieving symptoms such as shortness of breath, decreased exercise tolerance, and extremity edema.    Expected Outcomes Improve functional capacity of life;Short term: Attendance in program 2-3 days a week with increased exercise capacity. Reported lower sodium intake. Reported increased fruit and vegetable intake. Reports medication compliance.;Short term: Daily weights obtained and reported for increase. Utilizing diuretic protocols set by physician.;Long term: Adoption of self-care skills and reduction of barriers for early signs and symptoms recognition and intervention leading to self-care maintenance.    Lipids Yes    Intervention Provide education and support for participant on nutrition & aerobic/resistive exercise along with prescribed medications to achieve LDL 70mg , HDL >40mg .    Expected Outcomes Short Term: Participant states understanding of desired cholesterol values and is compliant with medications prescribed. Participant is following exercise prescription and nutrition guidelines.;Long Term: Cholesterol controlled with medications as prescribed, with individualized exercise RX and with personalized nutrition plan. Value goals: LDL < 70mg , HDL > 40 mg.             Tobacco Use Initial Evaluation: Social History   Tobacco Use  Smoking Status Never  Smokeless Tobacco Never    Exercise Goals and Review:  Exercise Goals     Row Name 10/14/22 1647             Exercise Goals   Increase Physical Activity Yes       Intervention Provide advice, education, support and counseling about physical activity/exercise needs.;Develop an individualized exercise prescription for aerobic and resistive training based on initial evaluation findings, risk stratification, comorbidities and participant's personal goals.       Expected Outcomes Short Term: Attend rehab on a regular basis to increase amount of physical activity.;Long Term: Exercising regularly at least 3-5 days a  week.;Long Term: Add in home exercise to make exercise part of routine and to increase amount of physical activity.       Increase Strength and Stamina Yes       Intervention Provide advice, education, support and counseling about physical activity/exercise needs.;Develop an individualized exercise prescription for aerobic and resistive training based on initial evaluation findings, risk stratification, comorbidities and participant's personal goals.       Expected Outcomes Short Term: Increase workloads from initial exercise prescription for resistance, speed, and METs.;Short Term: Perform resistance training exercises routinely during rehab and add in resistance training at home;Long Term: Improve cardiorespiratory fitness, muscular endurance and strength as measured by increased METs and functional capacity ( )       Able to understand and use rate of perceived exertion (RPE) scale Yes       Intervention Provide education and explanation on how to use RPE scale       Expected Outcomes Short Term: Able to use RPE daily in rehab to express subjective intensity level;Long Term:  Able to use RPE to guide intensity level when exercising independently       Able to  understand and use Dyspnea scale Yes       Intervention Provide education and explanation on how to use Dyspnea scale       Expected Outcomes Short Term: Able to use Dyspnea scale daily in rehab to express subjective sense of shortness of breath during exertion;Long Term: Able to use Dyspnea scale to guide intensity level when exercising independently       Knowledge and understanding of Target Heart Rate Range (THRR) Yes       Intervention Provide education and explanation of THRR including how the numbers were predicted and where they are located for reference       Expected Outcomes Short Term: Able to state/look up THRR;Long Term: Able to use THRR to govern intensity when exercising independently;Short Term: Able to use daily as guideline  for intensity in rehab       Able to check pulse independently Yes       Intervention Provide education and demonstration on how to check pulse in carotid and radial arteries.;Review the importance of being able to check your own pulse for safety during independent exercise       Expected Outcomes Short Term: Able to explain why pulse checking is important during independent exercise;Long Term: Able to check pulse independently and accurately       Understanding of Exercise Prescription Yes       Intervention Provide education, explanation, and written materials on patient's individual exercise prescription       Expected Outcomes Short Term: Able to explain program exercise prescription;Long Term: Able to explain home exercise prescription to exercise independently                Copy of goals given to participant.

## 2022-10-15 ENCOUNTER — Encounter: Payer: HMO | Admitting: *Deleted

## 2022-10-15 DIAGNOSIS — Z951 Presence of aortocoronary bypass graft: Secondary | ICD-10-CM

## 2022-10-15 NOTE — Progress Notes (Signed)
Daily Session Note  Patient Details  Name: Rodney Hall. MRN: [id number] Date of Birth: 1945/05/04 Referring Provider:   Flowsheet Row Cardiac Rehab from 10/14/2022 in [facility] Cardiac and Pulmonary Rehab  Referring Provider Dr. Julien Nordmann       Encounter Date: 10/15/2022  Check In:  Session Check In - 10/15/22 1129       Check-In   Supervising physician immediately available to respond to emergencies See telemetry face sheet for immediately available ER MD    Location [facility]-Cardiac & Pulmonary Rehab    Staff Present Cora Collum, RN, BSN, CCRP;Meredith Jewel Baize, RN BSN;Other;Noah Tickle, BS, Exercise Physiologist   Maxon Conetta BS, Exercise Physiologist   Virtual Visit No    Medication changes reported     No    Fall or balance concerns reported    No    Warm-up and Cool-down Performed on first and last piece of equipment    Resistance Training Performed Yes    VAD Patient? No    PAD/SET Patient? No      Pain Assessment   Currently in Pain? No/denies                Social History   Tobacco Use  Smoking Status Never  Smokeless Tobacco Never    Goals Met:  Exercise tolerated well Personal goals reviewed No report of concerns or symptoms today  Goals Unmet:  Not Applicable  Comments: Pt able to follow exercise prescription today without complaint.  Will continue to monitor for progression. First full day of exercise!  Patient was oriented to gym and equipment including functions, settings, policies, and procedures.  Patient's individual exercise prescription and treatment plan were reviewed.  All starting workloads were established based on the results of the 6 minute walk test done at initial orientation visit.  The plan for exercise progression was also introduced and progression will be customized based on patient's performance and goals.     Dr. Bethann Punches is Medical Director for [facility] Cardiac Rehabilitation.  Dr. Vida Rigger is Medical  Director for [facility] Pulmonary Rehabilitation.

## 2022-10-17 ENCOUNTER — Encounter: Payer: HMO | Admitting: *Deleted

## 2022-10-17 DIAGNOSIS — Z951 Presence of aortocoronary bypass graft: Secondary | ICD-10-CM

## 2022-10-17 NOTE — Progress Notes (Signed)
Daily Session Note  Patient Details  Name: Rodney Hall. MRN: [id number] Date of Birth: 07/03/1945 Referring Provider:   Flowsheet Row Cardiac Rehab from 10/14/2022 in [facility] Cardiac and Pulmonary Rehab  Referring Provider Dr. Julien Nordmann       Encounter Date: 10/17/2022  Check In:  Session Check In - 10/17/22 1130       Check-In   Supervising physician immediately available to respond to emergencies See telemetry face sheet for immediately available ER MD    Location [facility]-Cardiac & Pulmonary Rehab    Staff Present Susann Givens, RN BSN;Maxon Conetta BS, , Exercise Physiologist; Katrinka Blazing, RN, ADN    Virtual Visit No    Medication changes reported     No    Fall or balance concerns reported    No    Warm-up and Cool-down Performed on first and last piece of equipment    Resistance Training Performed Yes    VAD Patient? No    PAD/SET Patient? No      Pain Assessment   Currently in Pain? No/denies                Social History   Tobacco Use  Smoking Status Never  Smokeless Tobacco Never    Goals Met:  Independence with exercise equipment Exercise tolerated well No report of concerns or symptoms today Strength training completed today  Goals Unmet:  Not Applicable  Comments: Pt able to follow exercise prescription today without complaint.  Will continue to monitor for progression.    Dr. Bethann Punches is Medical Director for [facility] Cardiac Rehabilitation.  Dr. Vida Rigger is Medical Director for [facility] Pulmonary Rehabilitation.

## 2022-10-22 ENCOUNTER — Encounter: Payer: HMO | Admitting: *Deleted

## 2022-10-22 DIAGNOSIS — Z951 Presence of aortocoronary bypass graft: Secondary | ICD-10-CM | POA: Diagnosis not present

## 2022-10-22 NOTE — Progress Notes (Signed)
Daily Session Note  Patient Details  Name: Rodney Hall. MRN: [id number] Date of Birth: 17-Aug-1945 Referring Provider:   Flowsheet Row Cardiac Rehab from 10/14/2022 in [facility] Cardiac and Pulmonary Rehab  Referring Provider Dr. Julien Nordmann       Encounter Date: 10/22/2022  Check In:  Session Check In - 10/22/22 1057       Check-In   Supervising physician immediately available to respond to emergencies See telemetry face sheet for immediately available ER MD    Location [facility]-Cardiac & Pulmonary Rehab    Staff Present Maxon Suzzette Righter, , Exercise Physiologist;Kayveon Lennartz Jewel Baize, RN BSN;Jason Wallace Cullens, RDN, Luiz Iron, BS, Exercise Physiologist    Virtual Visit No    Medication changes reported     No    Fall or balance concerns reported    No    Warm-up and Cool-down Performed on first and last piece of equipment    Resistance Training Performed Yes    VAD Patient? No    PAD/SET Patient? No      Pain Assessment   Currently in Pain? No/denies                Social History   Tobacco Use  Smoking Status Never  Smokeless Tobacco Never    Goals Met:  Independence with exercise equipment Exercise tolerated well No report of concerns or symptoms today Strength training completed today  Goals Unmet:  Not Applicable  Comments: Pt able to follow exercise prescription today without complaint.  Will continue to monitor for progression.    Dr. Bethann Punches is Medical Director for [facility] Cardiac Rehabilitation.  Dr. Vida Rigger is Medical Director for [facility] Pulmonary Rehabilitation.

## 2022-10-23 NOTE — Progress Notes (Signed)
Complete physical exam   Patient: Rodney Hall.   DOB: 11/23/1945   77 y.o. Male  MRN: [id number] Visit Date: 10/11/2022  Today's healthcare provider: Mila Merry, MD   Chief Complaint  Patient presents with   Annual Exam    Patient was seen for Medicare wellness on 07/03/22.  He presents today for physical.   Subjective    Discussed the use of AI scribe software for clinical note transcription with the patient, who gave verbal consent to proceed.  History of Present Illness   The patient, a 77 year old with a history of kidney disease, presented for a routine physical examination. He reported a recent quintuple bypass surgery performed approximately four weeks prior to the visit. The patient noted occasional shortness of breath, particularly when climbing stairs, and a general decrease in strength since the surgery. However, he reported that his cardiologist was satisfied with his recovery progress.  The patient has a known allergy to statins and was in the process of arranging to receive a twice-yearly cholesterol-lowering injection instead of taking atorvastatin. He was awaiting further instructions regarding this treatment.  The patient also reported a significant improvement in kidney function, from 43% to 54%, after dietary changes recommended by his nephrologist, Dr. Karalee Height. He was scheduled for a follow-up appointment with the nephrologist in October.  The patient noted a small rash on his buttocks, which he suspected might be due to increased sedentary behavior following his surgery. He had a history of shingles in the same area but had since received the shingles vaccine.  The patient was up-to-date with his pneumonia vaccines and was advised to consider receiving the RSV vaccine during the upcoming flu season. He typically received his flu shot at a local pharmacy. The patient was also due for a PSA blood test, which he preferred to have done by his urologist.  The  patient was on aspirin and a blood thinner, with no reported issues or bleeding events.       Past Medical History:  Diagnosis Date   Abdominal mass    Abdominal pain 06/02/2017   Anxiety    Pt. denies   Arthritis    pt. 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   Past Surgical History:  Procedure Laterality Date   ANTERIOR LAT LUMBAR FUSION Right 07/30/2016   Procedure: Right Lateral two-three Lateral transpsoas interbody fusion with lateral plating/Minimally invasive decompression at Lumbar two-three;  Surgeon: Ditty, Loura Halt, MD;  Location: [facility] OR;  Service: Neurosurgery;  Laterality: Right;   BACK SURGERY  2012   CATARACT EXTRACTION, BILATERAL     CHOLECYSTECTOMY N/A 07/25/2017   Procedure: LAPAROSCOPIC CHOLECYSTECTOMY WITH INTRAOPERATIVE CHOLANGIOGRAM;  Surgeon: Luretha Murphy, MD;  Location: [facility] ORS;  Service: General;  Laterality: N/A;   COLONOSCOPY WITH PROPOFOL N/A 12/12/2017   Procedure: COLONOSCOPY WITH PROPOFOL;  Surgeon: Scot Jun, MD;  Location[facility] ENDOSCOPY;  Service: Endoscopy;  Laterality: N/A;   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   Procedure: CORONARY ARTERY BYPASS GRAFTING (CABG) TIMES FIVE, USING LEFT INTERNAL MAMMARY ARTERY AND RIGHT GREATER  SAPHENOUS VEIN HARVESTED ENDOSCOPICALLY;  Surgeon: [name], MD;  Location: [facility] OR;  Service: Open Heart Surgery;  Laterality: N/A;   EUS N/A 07/10/2017   Procedure: UPPER ENDOSCOPIC ULTRASOUND (EUS) LINEAR;  Surgeon: Rachael Fee, MD;  Location: [facility] ENDOSCOPY;  Service: Endoscopy;  Laterality: N/A;   EYE SURGERY     HEMI-MICRODISCECTOMY LUMBAR  LAMINECTOMY LEVEL 1  09/2018   [facility]   IABP INSERTION N/A 09/06/2022   Procedure: IABP Insertion;  Surgeon: Swaziland, Peter M, MD;  Location: [facility] INVASIVE CV LAB;  Service: Cardiovascular;  Laterality: N/A;   LEFT HEART CATH AND CORONARY ANGIOGRAPHY N/A 09/04/2022   Procedure: LEFT HEART CATH AND CORONARY ANGIOGRAPHY;  Surgeon: Yvonne Kendall, MD;  Location: [facility] INVASIVE CV LAB;  Service: Cardiovascular;  Laterality: N/A;   LEFT HEART CATH AND CORONARY ANGIOGRAPHY N/A 09/06/2022   Procedure: LEFT HEART CATH AND CORONARY ANGIOGRAPHY;  Surgeon: Swaziland, Peter M, MD;  Location: [facility] INVASIVE CV LAB;  Service: Cardiovascular;  Laterality: N/A;   NECK SURGERY  2000   TEE WITHOUT CARDIOVERSION N/A 09/07/2022   Procedure: TRANSESOPHAGEAL ECHOCARDIOGRAM;  Surgeon: [name], MD;  Location: [facility] OR;  Service: Open Heart Surgery;  Laterality: N/A;   TENNIS ELBOW RELEASE/NIRSCHEL PROCEDURE     TONSILLECTOMY AND ADENOIDECTOMY     Social History   Socioeconomic History   Marital status: Married    Spouse name: Not on file   Number of children: Not on file   Years of education: Not on file   Highest education level: Not on file  Occupational History   Not on file  Tobacco Use   Smoking status: Never   Smokeless tobacco: Never  Vaping Use   Vaping status: Never Used  Substance and Sexual Activity   Alcohol use: No    Alcohol/week: 0.0 standard drinks of alcohol   Drug use: No   Sexual activity: Yes  Other Topics Concern   Not on file  Social History Narrative   Not on file   Social Determinants of Health   Financial Resource Strain: Low Risk  (07/03/2022)   Overall Financial Resource Strain (CARDIA)    Difficulty of Paying Living Expenses: Not hard at all  Food Insecurity: No Food Insecurity (09/06/2022)   Hunger Vital Sign    Worried About Running Out of Food in the Last Year: Never true    Ran Out of Food in the Last Year: Never true  Transportation Needs: No Transportation  Needs (09/06/2022)   PRAPARE - Administrator, Civil Service (Medical): No    Lack of Transportation (Non-Medical): No  Physical Activity: Sufficiently Active (07/03/2022)   Exercise Vital Sign    Days of Exercise per Week: 5 days    Minutes of Exercise per Session: 60 min  Stress: No Stress Concern Present (07/03/2022)   [employer] of Occupational Health - Occupational Stress Questionnaire    Feeling of Stress : Not at all  Social Connections: Moderately Isolated (07/03/2022)   Social Connection and Isolation Panel [NHANES]    Frequency of Communication with Friends and Family: More than three times a week    Frequency of Social Gatherings with Friends and Family: Three times a week    Attends Religious Services: Never    Active Member of Clubs or Organizations: No    Attends Banker Meetings: Never    Marital Status: Married  Catering manager Violence: Not At Risk (09/06/2022)   Humiliation, Afraid, Rape, and Kick  questionnaire    Fear of Current or Ex-Partner: No    Emotionally Abused: No    Physically Abused: No    Sexually Abused: No   Family Status  Relation Name Status   Mother  Deceased   Father  Deceased       old age   Sister  Deceased   Brother  Alive  No partnership data on file   Family History  Problem Relation Age of Onset   Cancer Mother        brain   Diabetes Father    Rheumatic fever Sister    Heart disease Sister    Diabetes Brother    Allergies  Allergen Reactions   Pravastatin     jittery   Amoxicillin Rash    ++ got ceftriaxone in the past++ Has patient had a PCN reaction causing immediate rash, facial/tongue/throat swelling, SOB or lightheadedness with hypotension: Yes Has patient had a PCN reaction causing severe rash involving mucus membranes or skin necrosis: No Has patient had a PCN reaction that required hospitalization No Has patient had a PCN reaction occurring within the last 10 years: No If all of the above  answers are "NO", then may proceed with Cephalosporin use.      Patient Care Team: Malva Limes, MD as PCP - General (Family Medicine) Antonieta Iba, MD as PCP - Cardiology (Cardiology) Orson Ape, MD as Consulting Physician (Urology) Jene Every, MD as Consulting Physician (Orthopedic Surgery) Pa, [facility] (Ophthalmology)   Medications: Outpatient Medications Prior to Visit  Medication Sig   acetaminophen (TYLENOL) 325 MG tablet Take 650 mg by mouth every 6 (six) hours as needed for moderate pain or headache.   amiodarone (PACERONE) 200 MG tablet Take 1 tablet (200 mg total) by mouth 2 (two) times daily. X 7 days, then decrease to 200 mg daily   aspirin EC 81 MG tablet Take 1 tablet (81 mg total) by mouth daily. Swallow whole.   clopidogrel (PLAVIX) 75 MG tablet Take 1 tablet (75 mg total) by mouth daily.   imipramine (TOFRANIL) 50 MG tablet Take 50 mg by mouth at bedtime.   tamsulosin (FLOMAX) 0.4 MG CAPS capsule Take 0.4 mg by mouth daily after supper.    traMADol (ULTRAM) 50 MG tablet Take 1 tablet (50 mg total) by mouth every 6 (six) hours as needed for moderate pain.   atorvastatin (LIPITOR) 80 MG tablet Take 1 tablet (80 mg total) by mouth daily. (Patient not taking: Reported on 10/10/2022)   Diphenhydramine-APAP 12.5-500 MG TABS    [DISCONTINUED] acetaminophen (TYLENOL 8 HOUR) 650 MG CR tablet  (Patient not taking: Reported on 10/10/2022)   [DISCONTINUED] Acetaminophen 500 MG PACK  (Patient not taking: Reported on 10/10/2022)   No facility-administered medications prior to visit.    Review of Systems  Constitutional:  Negative for chills, diaphoresis and fever.  HENT:  Negative for congestion, ear discharge, ear pain, hearing loss, nosebleeds, sore throat and tinnitus.   Eyes:  Negative for photophobia, pain, discharge and redness.  Respiratory:  Negative for cough, shortness of breath, wheezing and stridor.   Cardiovascular:  Negative for chest pain,  palpitations and leg swelling.  Gastrointestinal:  Negative for abdominal pain, blood in stool, constipation, diarrhea, nausea and vomiting.  Endocrine: Negative for polydipsia.  Genitourinary:  Negative for dysuria, flank pain, frequency, hematuria and urgency.  Musculoskeletal:  Negative for back pain, myalgias and neck pain.  Skin:  Negative for rash.  Allergic/Immunologic: Negative  for environmental allergies.  Neurological:  Negative for dizziness, tremors, seizures, weakness and headaches.  Hematological:  Does not bruise/bleed easily.  Psychiatric/Behavioral:  Negative for hallucinations and suicidal ideas. The patient is not nervous/anxious.       Objective    BP 120/73 (BP Location: Left Arm, Patient Position: Sitting, Cuff Size: Normal)   Pulse 91   Temp (!) 97.5 F (36.4 C) (Oral)   Ht 5\' 10"  (1.778 m)   Wt 185 lb (83.9 kg)   SpO2 99%   BMI 26.54 kg/m    Physical Exam   General Appearance:    Well developed, well nourished male. Alert, cooperative, in no acute distress, appears stated age  Head:    Normocephalic, without obvious abnormality, atraumatic  Eyes:    PERRL, conjunctiva/corneas clear, EOM's intact, fundi    benign, both eyes       Ears:    Normal TM's and external ear canals, both ears  Nose:   Nares normal, septum midline, mucosa normal, no drainage   or sinus tenderness  Throat:   Lips, mucosa, and tongue normal; teeth and gums normal  Neck:   Supple, symmetrical, trachea midline, no adenopathy;       thyroid:  No enlargement/tenderness/nodules; no carotid   bruit or JVD  Back:     Symmetric, no curvature, ROM normal, no CVA tenderness  Lungs:     Clear to auscultation bilaterally, respirations unlabored  Chest wall:    No tenderness or deformity  Heart:    Normal heart rate. Normal rhythm. No murmurs, rubs, or gallops.  S1 and S2 normal  Abdomen:     Soft, non-tender, bowel sounds active all four quadrants,    no masses, no organomegaly   Genitalia:    deferred  Rectal:    deferred  Extremities:   All extremities are intact. No cyanosis or edema  Pulses:   2+ and symmetric all extremities  Skin:   Skin color, texture, turgor normal, no rashes or lesions  Lymph nodes:   Cervical, supraclavicular, and axillary nodes normal  Neurologic:   CNII-XII intact. Normal strength, sensation and reflexes      throughout     Last depression screening scores    10/14/2022    4:58 PM 10/11/2022    1:23 PM 07/03/2022   11:17 AM  PHQ 2/9 Scores  PHQ - 2 Score 0 0 0  PHQ- 9 Score 1 1    Last fall risk screening    10/10/2022   11:04 AM  Fall Risk   Falls in the past year? 1  Number falls in past yr: 1  Injury with Fall? 0  Risk for fall due to : History of fall(s)  Follow up Falls evaluation completed;Education provided;Falls prevention discussed   Last Audit-C alcohol use screening    10/11/2022    1:24 PM  Alcohol Use Disorder Test (AUDIT)  1. How often do you have a drink containing alcohol? 0  2. How many drinks containing alcohol do you have on a typical day when you are drinking? 0  3. How often do you have six or more drinks on one occasion? 0  AUDIT-C Score 0   A score of 3 or more in women, and 4 or more in men indicates increased risk for alcohol abuse, EXCEPT if all of the points are from question 1     Assessment & Plan    Routine Health Maintenance and Physical Exam  Exercise Activities and  Dietary recommendations  Goals      care coordination activities     Care Coordination Interventions:   SDOH screening completed Confirmed AWV scheduled for 07/23/22 Care Management Program introduced     DIET - EAT MORE FRUITS AND VEGETABLES        Immunization History  Administered Date(s) Administered   Fluad Quad(high Dose 65+) 11/30/2018, 12/20/2019   Influenza, High Dose Seasonal PF 12/26/2016, 12/23/2017, 12/06/2020   Influenza-Unspecified 12/03/2014   Pneumococcal Conjugate-13 03/09/2014    Pneumococcal Polysaccharide-23 04/06/2015   Tdap 04/06/2015   Zoster Recombinant(Shingrix) 01/14/2019, 03/16/2019   Zoster, Live 03/05/2011    Health Maintenance  Topic Date Due   COVID-19 Vaccine (1) Never done   INFLUENZA VACCINE  10/03/2022   Medicare Annual Wellness (AWV)  07/03/2023   DTaP/Tdap/Td (2 - Td or Tdap) 04/05/2025   Pneumonia Vaccine 72+ Years old  Completed   Hepatitis C Screening  Completed   Zoster Vaccines- Shingrix  Completed   HPV VACCINES  Aged Out   Colonoscopy  Discontinued    Discussed health benefits of physical activity, and encouraged him to engage in regular exercise appropriate for his age and condition.  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 Cardiologist reports good progress. Shortness of breath with exertion, expected in recovery period. -Continue current management and follow-up with cardiologist.  Hyperlipidemia History of statin intolerance. Plan to start injectable cholesterol medication. -Await further instructions regarding initiation of injectable cholesterol medication.  Skin infection Presence of a small skin infection on the buttock, not consistent with shingles. -Prescribe topical antibiotic ointment. Patient to apply as directed and keep area covered.  Diabetes monitoring No recent issues, but history of elevated glucose. -Schedule follow-up in February 2025 for A1c and glucose check.  General Health Maintenance -Continue aspirin and blood thinner as prescribed. -Plan for RSV vaccination with next flu shot at pharmacy. -Schedule PSA test with urologist. -Follow-up with Dr. Karalee Height in October 2024 for kidney function monitoring.           Mila Merry, MD  [facility] [phone] (phone) [phone] (fax)  [facility]

## 2022-10-24 ENCOUNTER — Encounter: Payer: HMO | Admitting: *Deleted

## 2022-10-24 DIAGNOSIS — Z951 Presence of aortocoronary bypass graft: Secondary | ICD-10-CM

## 2022-10-24 DIAGNOSIS — I4891 Unspecified atrial fibrillation: Secondary | ICD-10-CM | POA: Diagnosis not present

## 2022-10-24 NOTE — Progress Notes (Signed)
Daily Session Note  Patient Details  Name: Rodney Hall. MRN: [id number] Date of Birth: 1945/04/20 Referring Provider:   Flowsheet Row Cardiac Rehab from 10/14/2022 in [facility] Cardiac and Pulmonary Rehab  Referring Provider Dr. Julien Nordmann       Encounter Date: 10/24/2022  Check In:  Session Check In - 10/24/22 1118       Check-In   Supervising physician immediately available to respond to emergencies See telemetry face sheet for immediately available ER MD    Location [facility]-Cardiac & Pulmonary Rehab    Staff Present Susann Givens, RN BSN;Maxon Conetta BS, , Exercise Physiologist;Byrd Terrero Katrinka Blazing, RN, ADN    Virtual Visit No    Medication changes reported     No    Fall or balance concerns reported    No    Warm-up and Cool-down Performed on first and last piece of equipment    Resistance Training Performed Yes    VAD Patient? No    PAD/SET Patient? No      Pain Assessment   Currently in Pain? No/denies                Social History   Tobacco Use  Smoking Status Never  Smokeless Tobacco Never    Goals Met:  Independence with exercise equipment Exercise tolerated well No report of concerns or symptoms today Strength training completed today  Goals Unmet:  Not Applicable  Comments: Pt able to follow exercise prescription today without complaint.  Will continue to monitor for progression.    Dr. Bethann Punches is Medical Director for [facility] Cardiac Rehabilitation.  Dr. Vida Rigger is Medical Director for [facility] Pulmonary Rehabilitation.

## 2022-10-29 ENCOUNTER — Encounter: Payer: HMO | Admitting: *Deleted

## 2022-10-29 DIAGNOSIS — Z951 Presence of aortocoronary bypass graft: Secondary | ICD-10-CM

## 2022-10-29 NOTE — Progress Notes (Signed)
Daily Session Note  Patient Details  Name: Rodney Hall. MRN: [id number] Date of Birth: Apr 08, 1945 Referring Provider:   Flowsheet Row Cardiac Rehab from 10/14/2022 in [facility] Cardiac and Pulmonary Rehab  Referring Provider Dr. Julien Nordmann       Encounter Date: 10/29/2022  Check In:  Session Check In - 10/29/22 1133       Check-In   Supervising physician immediately available to respond to emergencies See telemetry face sheet for immediately available ER MD    Staff Present Cora Collum, RN, BSN, CCRP;Maxon Conetta BS, , Exercise Physiologist;Noah Tickle, BS, Exercise Physiologist    Virtual Visit No    Medication changes reported     No    Fall or balance concerns reported    No    Warm-up and Cool-down Performed on first and last piece of equipment    Resistance Training Performed Yes    VAD Patient? No    PAD/SET Patient? No      Pain Assessment   Currently in Pain? No/denies                Social History   Tobacco Use  Smoking Status Never  Smokeless Tobacco Never    Goals Met:  Independence with exercise equipment Exercise tolerated well No report of concerns or symptoms today  Goals Unmet:  Not Applicable  Comments: Pt able to follow exercise prescription today without complaint.  Will continue to monitor for progression.    Dr. Bethann Punches is Medical Director for [facility] Cardiac Rehabilitation.  Dr. Vida Rigger is Medical Director for [facility] Pulmonary Rehabilitation.

## 2022-10-30 ENCOUNTER — Ambulatory Visit (INDEPENDENT_AMBULATORY_CARE_PROVIDER_SITE_OTHER): Payer: Self-pay | Admitting: Surgery

## 2022-10-30 ENCOUNTER — Encounter: Payer: Self-pay | Admitting: Surgery

## 2022-10-30 VITALS — BP 99/66 | HR 63 | Resp 18 | Ht 71.0 in | Wt 188.0 lb

## 2022-10-30 DIAGNOSIS — Z951 Presence of aortocoronary bypass graft: Secondary | ICD-10-CM

## 2022-10-30 NOTE — Progress Notes (Signed)
HPI:  The patient is a 77 year old gentleman who returns for follow-up status post CABG x 5 on 09/07/2022.  He had postoperative atrial fibrillation converted with amiodarone.  He saw Joette Catching in the office on 10/09/2018 for and was doing well overall but still had some dizziness and orthostatic hypotension.  He had a long-term heart monitor placed on 10/01/2022.  This showed no atrial fibrillation.  He had 3 brief runs of SVT.  He is here today with his wife.  Overall he is feeling well and is going to cardiac rehab a few days a week and going to the gym the other days.  He still notes some brief dizziness when he stands up from laying down but that resolves quickly.  He has some incisional soreness but denies any exertional chest discomfort and no shortness of breath.  He has not had any peripheral edema.  Current Outpatient Medications  Medication Sig Dispense Refill   acetaminophen (TYLENOL) 325 MG tablet Take 650 mg by mouth every 6 (six) hours as needed for moderate pain or headache.     amiodarone (PACERONE) 200 MG tablet Take 1 tablet (200 mg total) by mouth 2 (two) times daily. X 7 days, then decrease to 200 mg daily 60 tablet 0   aspirin EC 81 MG tablet Take 1 tablet (81 mg total) by mouth daily. Swallow whole. 30 tablet 12   atorvastatin (LIPITOR) 80 MG tablet Take 1 tablet (80 mg total) by mouth daily. (Patient not taking: Reported on 10/10/2022)     clopidogrel (PLAVIX) 75 MG tablet Take 1 tablet (75 mg total) by mouth daily. 30 tablet 3   Diphenhydramine-APAP 12.5-500 MG TABS      imipramine (TOFRANIL) 50 MG tablet Take 50 mg by mouth at bedtime.     silver sulfADIAZINE (SILVADENE) 1 % cream Apply 1 Application topically daily. 25 g 1   tamsulosin (FLOMAX) 0.4 MG CAPS capsule Take 0.4 mg by mouth daily after supper.      traMADol (ULTRAM) 50 MG tablet Take 1 tablet (50 mg total) by mouth every 6 (six) hours as needed for moderate pain. 30 tablet 0   No current facility-administered  medications for this visit.     Physical Exam: BP 99/66 (BP Location: Left Arm, Patient Position: Sitting)   Pulse 63   Resp 18   Ht 5\' 11"  (1.803 m)   Wt 188 lb (85.3 kg)   SpO2 97% Comment: RA  BMI 26.22 kg/m  He looks well. Cardiac exam shows a regular rate and rhythm with normal heart sounds.  There is no murmur. Lungs are clear. Chest incision is well-healed and the sternum is stable. His leg incision is healing well and there is no peripheral edema.  Diagnostic Tests:  None today  Impression:  Overall he is doing well almost 2 months out from his surgery.  He appears to be maintaining sinus rhythm so I told him to discontinue the amiodarone which was used for postoperative atrial fibrillation.  He still has some mild dizziness with positional changes but he normally runs a lower blood pressure.  This should improve with time and his heart rate may increase once off amiodarone.  I encouraged him to continue remaining active and to watch his diet.  I asked him not to lift anything heavier than 10 pounds for 3 months postoperatively.  Plan:  He will continue to follow-up with cardiology and will return to see me if he has any problems with his  incisions.   [name], MD [facility] [phone]

## 2022-10-31 ENCOUNTER — Encounter: Payer: HMO | Admitting: *Deleted

## 2022-10-31 DIAGNOSIS — Z951 Presence of aortocoronary bypass graft: Secondary | ICD-10-CM | POA: Diagnosis not present

## 2022-10-31 NOTE — Progress Notes (Signed)
Daily Session Note  Patient Details  Name: Rodney Hall. MRN: [id number] Date of Birth: 11/05/1945 Referring Provider:   Flowsheet Row Cardiac Rehab from 10/14/2022 in [facility] Cardiac and Pulmonary Rehab  Referring Provider Dr. Julien Nordmann       Encounter Date: 10/31/2022  Check In:  Session Check In - 10/31/22 1117       Check-In   Supervising physician immediately available to respond to emergencies See telemetry face sheet for immediately available ER MD    Location [facility]-Cardiac & Pulmonary Rehab    Staff Present Rory Percy, MS, Exercise Physiologist;Joseph Shelbie Proctor, RN, ADN    Virtual Visit No    Medication changes reported     No    Fall or balance concerns reported    No    Warm-up and Cool-down Performed on first and last piece of equipment    Resistance Training Performed Yes    VAD Patient? No    PAD/SET Patient? No      Pain Assessment   Currently in Pain? No/denies                Social History   Tobacco Use  Smoking Status Never  Smokeless Tobacco Never    Goals Met:  Independence with exercise equipment Exercise tolerated well No report of concerns or symptoms today Strength training completed today  Goals Unmet:  Not Applicable  Comments: Pt able to follow exercise prescription today without complaint.  Will continue to monitor for progression.    Dr. Bethann Punches is Medical Director for [facility] Cardiac Rehabilitation.  Dr. Vida Rigger is Medical Director for [facility] Pulmonary Rehabilitation.

## 2022-11-05 ENCOUNTER — Encounter: Payer: HMO | Attending: Cardiovascular Disease | Admitting: *Deleted

## 2022-11-05 DIAGNOSIS — Z951 Presence of aortocoronary bypass graft: Secondary | ICD-10-CM | POA: Insufficient documentation

## 2022-11-05 DIAGNOSIS — M545 Low back pain, unspecified: Secondary | ICD-10-CM | POA: Diagnosis not present

## 2022-11-05 NOTE — Progress Notes (Signed)
Daily Session Note  Patient Details  Name: Rodney Hall. MRN: [id number] Date of Birth: 05-14-1945 Referring Provider:   Flowsheet Row Cardiac Rehab from 10/14/2022 in [facility] Cardiac and Pulmonary Rehab  Referring Provider Dr. Julien Nordmann       Encounter Date: 11/05/2022  Check In:  Session Check In - 11/05/22 1116       Check-In   Supervising physician immediately available to respond to emergencies See telemetry face sheet for immediately available ER MD    Location [facility]-Cardiac & Pulmonary Rehab    Staff Present Ronette Deter, BS, Exercise Physiologist;Meredith Jewel Baize, RN BSN;Maxon Conetta BS, , Exercise Physiologist;Traniya Prichett Katrinka Blazing, RN, ADN    Virtual Visit No    Medication changes reported     No    Fall or balance concerns reported    No    Warm-up and Cool-down Performed on first and last piece of equipment    Resistance Training Performed Yes    VAD Patient? No    PAD/SET Patient? No      Pain Assessment   Currently in Pain? No/denies                Social History   Tobacco Use  Smoking Status Never  Smokeless Tobacco Never    Goals Met:  Independence with exercise equipment Exercise tolerated well No report of concerns or symptoms today Strength training completed today  Goals Unmet:  Not Applicable  Comments: Pt able to follow exercise prescription today without complaint.  Will continue to monitor for progression.    Dr. Bethann Punches is Medical Director for [facility] Cardiac Rehabilitation.  Dr. Vida Rigger is Medical Director for [facility] Pulmonary Rehabilitation.

## 2022-11-05 NOTE — Progress Notes (Signed)
Cardiology Office Note    Date:  11/06/2022   ID:  Rodney Bach., DOB 1945/08/24, MRN [id number]  PCP:  Malva Limes, MD  Cardiologist:  Julien Nordmann, MD  Electrophysiologist:  None   Chief Complaint: Follow-up  History of Present Illness:   Rodney Rampley. is a 77 y.o. male with history of CAD status post 5-vessel CABG on 09/07/2022, postoperative A-fib, CKD stage III, HLD, and BPH who presents for follow-up of CAD.  He was admitted to the hospital in 09/2022 with NSTEMI.  LHC on 09/04/2022 showed severe three-vessel CAD including sequential 90% stenoses of the mid/distal LAD, 70% mid LCx stenosis, occluded distal segment of OM1, and 80 to 90% ostial rPDA and proximal rPLA stenoses.  Moderately to severely reduced LV systolic function with an EF of 30 to 35% with apical dyskinesis.  LVEDP 15 mmHg.  In this setting, he was transferred to [facility] for consideration of CABG.  Due to refractory angina, he underwent IABP placement with stable coronary anatomy by cardiac cath on 09/06/2022.  He subsequently underwent 5 vessel CABG on 09/07/2022 with LIMA to LAD, SVG to diagonal, SVG to OM 2, and sequential SVG to rPDA and rPLA.  Postoperative course was notable for bifascicular block leading beta-blocker to be deferred, atrial fibrillation converting to sinus rhythm with amiodarone, volume overload with subsequent AKI, confusion, and agitation.  He was last seen in the office on 10/01/2022 and was without symptoms of angina or cardiac decompensation.  He reported prior jitteriness with statin therapy.  Zio patch following discharge showed a predominant rhythm of sinus with an average rate of 74 bpm (range 51 to 125 bpm), 3 episodes of SVT lasting up to 9 beats, and rare atrial and ventricular ectopy.  Patient triggered events were associated with sinus rhythm.  He followed up with CVTS on 10/30/2022 and was doing well from a surgical perspective.  Amiodarone was discontinued at that time.  He does  continue to note some mild positional dizziness.  He was advised to not lift anything heavier than 10 pounds for 3 months postoperatively.  He comes in doing well from a cardiac perspective and is without symptoms of angina or cardiac decompensation.  Mild positional dizziness persists, though after he acclimates to positional change she is without further dizziness thereafter.  No dyspnea, palpitations, near-syncope, or syncope.  No falls or symptoms concerning for bleeding.  Adherent and tolerating aspirin and clopidogrel.  Remains active at baseline with cardiac rehab and working out at the gym several days per week.  He sees Pharm.D. with lipid clinic tomorrow.  Overall, feels well and is pleased with his recovery.   Labs independently reviewed: 09/2022 - Hgb 13.7, PLT 261, BUN 14, serum creatinine 1.36, potassium 4.5, magnesium 2.3, A1c 5.7, LP(a) 19.3, TC 147, TG 177, HDL 32, LDL 80 06/2022 - albumin 4.4 10/2021 - AST/ALT normal 05/2020 - TSH normal  Past Medical History:  Diagnosis Date   Abdominal mass    Abdominal pain 06/02/2017   Anxiety    Pt. denies   Arthritis    pt. 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   left above alar crease sidewall of nose/in situ    Past Surgical History:  Procedure Laterality Date   ANTERIOR LAT LUMBAR FUSION Right 07/30/2016   Procedure: Right Lateral two-three Lateral transpsoas interbody fusion with lateral plating/Minimally invasive decompression at Lumbar two-three;  Surgeon: Ditty, Loura Halt, MD;  Location: [facility] OR;  Service: Neurosurgery;  Laterality: Right;   BACK SURGERY  2012   CATARACT EXTRACTION, BILATERAL     CHOLECYSTECTOMY N/A  07/25/2017   Procedure: LAPAROSCOPIC CHOLECYSTECTOMY WITH INTRAOPERATIVE CHOLANGIOGRAM;  Surgeon: Luretha Murphy, MD;  Location: [facility] ORS;  Service: General;  Laterality: N/A;   COLONOSCOPY WITH PROPOFOL N/A 12/12/2017   Procedure: COLONOSCOPY WITH PROPOFOL;  Surgeon: Scot Jun, MD;  Location: [facility] ENDOSCOPY;  Service: Endoscopy;  Laterality: N/A;   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   Procedure: CORONARY ARTERY BYPASS GRAFTING (CABG) TIMES FIVE, USING LEFT INTERNAL MAMMARY ARTERY AND RIGHT GREATER SAPHENOUS VEIN HARVESTED ENDOSCOPICALLY;  Surgeon: [name], MD;  Location: [facility] OR;  Service: Open Heart Surgery;  Laterality: N/A;   EUS N/A 07/10/2017   Procedure: UPPER ENDOSCOPIC ULTRASOUND (EUS) LINEAR;  Surgeon: Rachael Fee, MD;  Location: [facility] ENDOSCOPY;  Service: Endoscopy;  Laterality: N/A;   EYE SURGERY     HEMI-MICRODISCECTOMY LUMBAR LAMINECTOMY LEVEL 1  09/2018   [facility]   IABP INSERTION N/A 09/06/2022   Procedure: IABP Insertion;  Surgeon: Swaziland, Peter M, MD;  Location: [facility] INVASIVE CV LAB;  Service: Cardiovascular;  Laterality: N/A;   LEFT HEART CATH AND CORONARY ANGIOGRAPHY N/A 09/04/2022   Procedure: LEFT HEART CATH AND CORONARY ANGIOGRAPHY;  Surgeon: Yvonne Kendall, MD;  Location: [facility] INVASIVE CV LAB;  Service: Cardiovascular;  Laterality: N/A;   LEFT HEART CATH AND CORONARY ANGIOGRAPHY N/A 09/06/2022   Procedure: LEFT HEART CATH AND CORONARY ANGIOGRAPHY;  Surgeon: Swaziland, Peter M, MD;  Location: [facility] INVASIVE CV LAB;  Service: Cardiovascular;  Laterality: N/A;   NECK SURGERY  2000   TEE WITHOUT CARDIOVERSION N/A 09/07/2022   Procedure: TRANSESOPHAGEAL ECHOCARDIOGRAM;  Surgeon: [name], MD;  Location: [facility] OR;  Service: Open Heart Surgery;  Laterality: N/A;   TENNIS ELBOW RELEASE/NIRSCHEL PROCEDURE     TONSILLECTOMY AND ADENOIDECTOMY      Current Medications: Current Meds  Medication Sig   acetaminophen (TYLENOL) 325 MG tablet Take 650 mg by  mouth every 6 (six) hours as needed for moderate pain or headache.   aspirin EC 81 MG tablet Take 1 tablet (81 mg total) by mouth daily. Swallow whole.   clopidogrel (PLAVIX) 75 MG tablet Take 1 tablet (75 mg total) by mouth daily.   imipramine (TOFRANIL) 50 MG tablet Take 50 mg by mouth at bedtime.   tamsulosin (FLOMAX) 0.4 MG CAPS capsule Take 0.4 mg by mouth daily after supper.    traMADol (ULTRAM) 50 MG tablet Take 1 tablet (50 mg total) by mouth every 6 (six) hours as needed for moderate pain.    Allergies:   Pravastatin and Amoxicillin   Social History   Socioeconomic History   Marital status: Married    Spouse name: Not on file   Number of children: Not on file   Years of education: Not on file   Highest education level: Not on file  Occupational History   Not on file  Tobacco Use   Smoking status: Never   Smokeless tobacco: Never  Vaping Use   Vaping status: Never Used  Substance and Sexual Activity   Alcohol use: No  Alcohol/week: 0.0 standard drinks of alcohol   Drug use: No   Sexual activity: Yes  Other Topics Concern   Not on file  Social History Narrative   Not on file   Social Determinants of Health   Financial Resource Strain: Low Risk  (07/03/2022)   Overall Financial Resource Strain (CARDIA)    Difficulty of Paying Living Expenses: Not hard at all  Food Insecurity: No Food Insecurity (09/06/2022)   Hunger Vital Sign    Worried About Running Out of Food in the Last Year: Never true    Ran Out of Food in the Last Year: Never true  Transportation Needs: No Transportation Needs (09/06/2022)   PRAPARE - Administrator, Civil Service (Medical): No    Lack of Transportation (Non-Medical): No  Physical Activity: Sufficiently Active (07/03/2022)   Exercise Vital Sign    Days of Exercise per Week: 5 days    Minutes of Exercise per Session: 60 min  Stress: No Stress Concern Present (07/03/2022)   [employer] of Occupational Health - Occupational  Stress Questionnaire    Feeling of Stress : Not at all  Social Connections: Moderately Isolated (07/03/2022)   Social Connection and Isolation Panel [NHANES]    Frequency of Communication with Friends and Family: More than three times a week    Frequency of Social Gatherings with Friends and Family: Three times a week    Attends Religious Services: Never    Active Member of Clubs or Organizations: No    Attends Banker Meetings: Never    Marital Status: Married     Family History:  The patient's family history includes Cancer in his mother; Diabetes in his brother and father; Heart disease in his sister; Rheumatic fever in his sister.  ROS:   12-point review of systems is negative unless otherwise noted in the HPI.   EKGs/Labs/Other Studies Reviewed:    Studies reviewed were summarized above. The additional studies were reviewed today:  Zio patch 09/2022: Normal sinus rhythm Patient had a min HR of 51 bpm, max HR of 125 bpm, and avg HR of 74 bpm.   3 Supraventricular Tachycardia runs occurred, the run with the fastest interval lasting 4 beats with a max rate of 124 bpm, the longest lasting 9 beats with an avg rate of 103 bpm.    Isolated SVEs were rare (<1.0%), SVE Couplets were rare (<1.0%), and SVE Triplets were rare (<1.0%).  Isolated VEs were rare (<1.0%), VE Couplets were rare (<1.0%), and no VE Triplets were present.   Patient triggered events associated with normal sinus rhythm. __________  Intraoperative TEE 09/07/2022: POST-OP IMPRESSIONS  Overall, there were no significant changes from pre-bypass.  _ Left Ventricle: The left ventricle is unchanged from pre-bypass.  _ Right Ventricle: The right ventricle appears unchanged from pre-bypass.  _ Aorta: The aorta appears unchanged from pre-bypass.  _ Left Atrium: The left atrium appears unchanged from pre-bypass.  _ Left Atrial Appendage: The left atrial appendage appears unchanged from  pre-bypass.  _ Aortic  Valve: The aortic valve appears unchanged from pre-bypass.  _ Mitral Valve: There is mild regurgitation.  _ Tricuspid Valve: There is trace regurgitation.  _ Pulmonic Valve: The pulmonic valve appears unchanged from pre-bypass.  _ Interatrial Septum: The interatrial septum appears unchanged from  pre-bypass.  _ Interventricular Septum: The interventricular septum appears unchanged  from  pre-bypass.  _ Pericardium: The pericardium appears unchanged from pre-bypass.  __________  Pre-CABG vascular imaging 09/06/2022: Summary:  Right Carotid: Waveforms are unreliable in the presence of an IABP.  Possible area of stenosis is noted within the proximal ICA. Unable  to determine degree of stenosis due to unreliable waveforms.   Left Carotid: Waveforms are unreliable in the presence of an IABP.  Vertebrals: Bilateral vertebral arteries demonstrate antegrade flow.   Left ABI: Resting left ankle-brachial index is within normal range.  Right Upper Extremity: Doppler waveform obliterate with right radial  compression. Doppler waveforms remain within normal limits with right  ulnar compression.  Left Upper Extremity: Doppler waveforms decrease 50% with left radial  compression. Doppler waveforms remain within normal limits with left ulnar  compression.  __________  LHC 09/06/2022:   Mid LM to Ost LAD lesion is 30% stenosed with 30% stenosed side branch in Ost Cx.   Mid LAD-1 lesion is 90% stenosed.   Mid LAD-2 lesion is 90% stenosed.   Dist LAD lesion is 90% stenosed.   Mid Cx to Dist Cx lesion is 70% stenosed.   2nd Diag lesion is 70% stenosed.   2nd Mrg lesion is 100% stenosed.   RPDA lesion is 80% stenosed.   RPAV lesion is 90% stenosed.   LV end diastolic pressure is normal.   No change in severe 3 vessel obstructive CAD Normal LVEDP IABP placed for refractory angina __________  LHC 09/04/2022: Conclusions: Severe three-vessel coronary artery disease, as detailed below.  Culprit  lesion for the patient's NSTEMI is most likely occluded distal segment of OM1.  There are also sequential 90% stenoses of the mid/distal LAD, 70% mid LCx lesion, and 80-90% ostial rPDA and proximal rPLA stenoses. Moderately to severely reduced left ventricular systolic function (LVEF 30-35%) with apical dyskinesis. Upper normal left ventricular filling pressure (LVEDP 15 mmHg).   Recommendations: Transfer to [facility] for cardiac surgery consultation for CABG. Restart heparin infusion 2 hours after TR band removal.  Defer addition of P2Y12 inhibitor pending cardiac surgery consultation. Aggressive secondary prevention of coronary artery disease. Maintain net even fluid balance and escalate goal-directed medical therapy for HFrEF as tolerated. Follow-up echocardiogram.   EKG:  EKG is ordered today.  The EKG ordered today demonstrates NSR, 80 bpm, bifascicular block with LAFB and RBBB, prior inferior infarct  Recent Labs: 09/08/2022: Magnesium 2.3 10/01/2022: BUN 14; Creatinine, Ser 1.36; Hemoglobin 13.7; Platelets 261; Potassium 4.5; Sodium 138  Recent Lipid Panel    Component Value Date/Time   CHOL 147 09/04/2022 0300   CHOL 181 02/22/2022 1056   TRIG 177 (H) 09/04/2022 0300   HDL 32 (L) 09/04/2022 0300   HDL 34 (L) 02/22/2022 1056   CHOLHDL 4.6 09/04/2022 0300   VLDL 35 09/04/2022 0300   LDLCALC 80 09/04/2022 0300   LDLCALC 108 (H) 02/22/2022 1056    PHYSICAL EXAM:    VS:  BP 118/72 (BP Location: Left Arm, Patient Position: Sitting, Cuff Size: Normal)   Pulse 80   Ht 5\' 10"  (1.778 m)   Wt 186 lb 3.2 oz (84.5 kg)   SpO2 95%   BMI 26.72 kg/m   BMI: Body mass index is 26.72 kg/m.  Physical Exam Vitals reviewed.  Constitutional:      Appearance: He is well-developed.  HENT:     Head: Normocephalic and atraumatic.  Eyes:     General:        Right eye: No discharge.        Left eye: No discharge.  Neck:     Vascular: No JVD.  Cardiovascular:  Rate and Rhythm: Normal  rate and regular rhythm.     Pulses:          Posterior tibial pulses are 2+ on the right side and 2+ on the left side.     Heart sounds: Normal heart sounds, S1 normal and S2 normal. Heart sounds not distant. No midsystolic click and no opening snap. No murmur heard.    No friction rub.     Comments: Well-healed midline anterior chest surgical incision site. Pulmonary:     Effort: Pulmonary effort is normal. No respiratory distress.     Breath sounds: Normal breath sounds. No decreased breath sounds, wheezing or rales.  Chest:     Chest wall: No tenderness.  Abdominal:     General: There is no distension.  Musculoskeletal:     Cervical back: Normal range of motion.     Right lower leg: No edema.     Left lower leg: No edema.  Skin:    General: Skin is warm and dry.     Nails: There is no clubbing.  Neurological:     Mental Status: He is alert and oriented to person, place, and time.  Psychiatric:        Speech: Speech normal.        Behavior: Behavior normal.        Thought Content: Thought content normal.        Judgment: Judgment normal.     Wt Readings from Last 3 Encounters:  11/06/22 186 lb 3.2 oz (84.5 kg)  10/30/22 188 lb (85.3 kg)  10/14/22 184 lb 14.4 oz (83.9 kg)     ASSESSMENT & PLAN:   CAD involving the native coronary arteries with NSTEMI status post 5 vessel CABG without angina: He is doing very well and without symptoms of angina or cardiac decompensation.  Has not been maintained on beta-blocker secondary to bifascicular block.  Participating with cardiac rehab.  Continue aggressive risk factor modification and secondary prevention including aspirin and clopidogrel.  Following up with Pharm.D. for discussion of lipid management tomorrow as outlined below.  No indication for further ischemic testing at this time.  Postoperative A-fib: Maintaining sinus rhythm.  No longer on amiodarone.  No evidence of recurrence at follow-up visit or on outpatient cardiac  monitoring.  In this setting, he has not been maintained on OAC.  Should he have documented recurrence of A-fib, OAC will need to be revisited at that time.  PSVT: 3 brief episodes noted on outpatient cardiac monitoring.  No sustained arrhythmias.  Not on AV nodal blocking medications.  HTN: Blood pressure is well-controlled in the office today.  He does continue to note mild positional dizziness.  Recommend adequate hydration and slow positional changes.  HLD with statin intolerance: LDL 80 in 09/2022 with target LDL less than 55.  Not currently on lipid-lowering therapy.  Has previously tried atorvastatin and noted jitteriness with this.  Does not appear he has tried rosuvastatin.  He is scheduled to see Pharm.D. with the lipid clinic 9/5.  CKD stage IIIa: Stable on recent check.   Disposition: F/u with Dr. Mariah Milling or an APP in 3 months.   Medication Adjustments/Labs and Tests Ordered: Current medicines are reviewed at length with the patient today.  Concerns regarding medicines are outlined above. Medication changes, Labs and Tests ordered today are summarized above and listed in the Patient Instructions accessible in Encounters.   Signed, Eula Listen, PA-C 11/06/2022 2:12 PM     [facility]  [place] [address] [place], [place] [address] [phone]

## 2022-11-06 ENCOUNTER — Encounter: Payer: Self-pay | Admitting: Physician Assistant

## 2022-11-06 ENCOUNTER — Encounter: Payer: Self-pay | Admitting: *Deleted

## 2022-11-06 ENCOUNTER — Ambulatory Visit: Payer: HMO | Attending: Physician Assistant | Admitting: Physician Assistant

## 2022-11-06 VITALS — BP 118/72 | HR 80 | Ht 70.0 in | Wt 186.2 lb

## 2022-11-06 DIAGNOSIS — N1831 Chronic kidney disease, stage 3a: Secondary | ICD-10-CM | POA: Diagnosis not present

## 2022-11-06 DIAGNOSIS — Z951 Presence of aortocoronary bypass graft: Secondary | ICD-10-CM

## 2022-11-06 DIAGNOSIS — I9789 Other postprocedural complications and disorders of the circulatory system, not elsewhere classified: Secondary | ICD-10-CM | POA: Diagnosis not present

## 2022-11-06 DIAGNOSIS — I10 Essential (primary) hypertension: Secondary | ICD-10-CM

## 2022-11-06 DIAGNOSIS — E785 Hyperlipidemia, unspecified: Secondary | ICD-10-CM

## 2022-11-06 DIAGNOSIS — I471 Supraventricular tachycardia, unspecified: Secondary | ICD-10-CM | POA: Diagnosis not present

## 2022-11-06 DIAGNOSIS — I214 Non-ST elevation (NSTEMI) myocardial infarction: Secondary | ICD-10-CM

## 2022-11-06 DIAGNOSIS — Z789 Other specified health status: Secondary | ICD-10-CM | POA: Diagnosis not present

## 2022-11-06 DIAGNOSIS — I2510 Atherosclerotic heart disease of native coronary artery without angina pectoris: Secondary | ICD-10-CM | POA: Diagnosis not present

## 2022-11-06 DIAGNOSIS — I4891 Unspecified atrial fibrillation: Secondary | ICD-10-CM | POA: Diagnosis not present

## 2022-11-06 NOTE — Progress Notes (Signed)
Cardiac Individual Treatment Plan  Patient Details  Name: Rodney Hall. MRN: [id number] Date of Birth: June 21, 1945 Referring Provider:   Flowsheet Row Cardiac Rehab from 10/14/2022 in [facility] Cardiac and Pulmonary Rehab  Referring Provider Dr. Julien Nordmann       Initial Encounter Date:  Flowsheet Row Cardiac Rehab from 10/14/2022 in [facility] Cardiac and Pulmonary Rehab  Date 10/14/22       Visit Diagnosis: S/P CABG x 5  Patient's Home Medications on Admission:  Current Outpatient Medications:    acetaminophen (TYLENOL) 325 MG tablet, Take 650 mg by mouth every 6 (six) hours as needed for moderate pain or headache., Disp: , Rfl:    amiodarone (PACERONE) 200 MG tablet, Take 1 tablet (200 mg total) by mouth 2 (two) times daily. X 7 days, then decrease to 200 mg daily, Disp: 60 tablet, Rfl: 0   aspirin EC 81 MG tablet, Take 1 tablet (81 mg total) by mouth daily. Swallow whole., Disp: 30 tablet, Rfl: 12   atorvastatin (LIPITOR) 80 MG tablet, Take 1 tablet (80 mg total) by mouth daily., Disp: , Rfl:    clopidogrel (PLAVIX) 75 MG tablet, Take 1 tablet (75 mg total) by mouth daily., Disp: 30 tablet, Rfl: 3   Diphenhydramine-APAP 12.5-500 MG TABS, , Disp: , Rfl:    imipramine (TOFRANIL) 50 MG tablet, Take 50 mg by mouth at bedtime., Disp: , Rfl:    silver sulfADIAZINE (SILVADENE) 1 % cream, Apply 1 Application topically daily., Disp: 25 g, Rfl: 1   tamsulosin (FLOMAX) 0.4 MG CAPS capsule, Take 0.4 mg by mouth daily after supper. , Disp: , Rfl:    traMADol (ULTRAM) 50 MG tablet, Take 1 tablet (50 mg total) by mouth every 6 (six) hours as needed for moderate pain., Disp: 30 tablet, Rfl: 0  Past Medical History: Past Medical History:  Diagnosis Date   Abdominal mass    Abdominal pain 06/02/2017   Anxiety    Pt. denies   Arthritis    pt. 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   left above alar crease sidewall of nose/in situ    Tobacco Use: Social History   Tobacco Use  Smoking Status Never  Smokeless Tobacco Never    Labs: Review Flowsheet  More data exists      Latest Ref Rng & Units 10/10/2021 02/22/2022 09/04/2022 09/06/2022 09/07/2022  Labs for ITP Cardiac and Pulmonary Rehab  Cholestrol 0 - 200 mg/dL 409  811  914  - -  LDL (calc) 0 - 99 mg/dL 782  956  80  - -  HDL-C >40 mg/dL 29  34  32  - -  Trlycerides <150 mg/dL 213  086  578  - -  Hemoglobin A1c 4.8 - 5.6 % 6.5  6.3  - 5.7  -  PH, Arterial 7.35 - 7.45 - - - - 7.345  7.362  7.343  7.363  7.377  7.322  7.357  7.428   PCO2 arterial 32 - 48 mmHg - - - - 37.9  35.8  38.9  39.3  37.8  46.0  38.3  33.7   Bicarbonate 20.0 - 28.0  mmol/L - - - - 20.8  20.3  21.1  22.4  22.2  24.2  23.8  21.5  22.3   TCO2 22 - 32 mmol/L - - - - 22  21  24  22  24  24  23  23  26  25  26  24  23  23    Acid-base deficit 0.0 - 2.0 mmol/L - - - - 5.0  5.0  4.0  3.0  3.0  2.0  2.0  4.0  2.0   O2 Saturation % - - - - 97  96  99  100  100  82  100  100  91     Details       Multiple values from one day are sorted in reverse-chronological order          Exercise Target Goals: Exercise Program Goal: Individual exercise prescription set using results from initial 6 min walk test and THRR while considering  patient's activity barriers and safety.   Exercise Prescription Goal: Initial exercise prescription builds to 30-45 minutes a day of aerobic activity, 2-3 days per week.  Home exercise guidelines will be given to patient during program as part of exercise prescription that the participant will acknowledge.   Education: Aerobic Exercise: - Group verbal and visual presentation on the components of exercise prescription.  Introduces F.I.T.T principle from ACSM for exercise prescriptions.  Reviews F.I.T.T. principles of aerobic exercise including progression. Written material given at graduation. Flowsheet Row Cardiac Rehab from 10/24/2022 in [facility] Cardiac and Pulmonary Rehab  Education need identified 10/14/22  Date 10/17/22  Educator NT  Instruction Review Code 1- Verbalizes Understanding       Education: Resistance Exercise: - Group verbal and visual presentation on the components of exercise prescription. Introduces F.I.T.T principle from ACSM for exercise prescriptions  Reviews F.I.T.T. principles of resistance exercise including progression. Written material given at graduation. Flowsheet Row Cardiac Rehab from 10/24/2022 in [facility] Cardiac and Pulmonary Rehab  Education need identified 10/14/22  Date 10/24/22  Educator NT  Instruction Review Code 1- [employer] Understanding        Education: Exercise & Equipment Safety: - Individual verbal instruction and demonstration of equipment use and safety with use of the equipment. Flowsheet Row Cardiac Rehab from 10/24/2022 in [facility] Cardiac and Pulmonary Rehab  Date 10/14/22  Educator MB  Instruction Review Code 1- Verbalizes Understanding       Education: Exercise Physiology & General Exercise Guidelines: - Group verbal and written instruction with models to review the exercise physiology of the cardiovascular system and associated critical values. Provides general exercise guidelines with specific guidelines to those with heart or lung disease.  Flowsheet Row Cardiac Rehab from 10/24/2022 in [facility] Cardiac and Pulmonary Rehab  Education need identified 10/14/22       Education: Flexibility, Balance, Mind/Body Relaxation: - Group verbal and visual presentation with interactive activity on the components of exercise prescription. Introduces F.I.T.T principle from ACSM for exercise prescriptions. Reviews F.I.T.T. principles of flexibility and balance  exercise training including progression. Also discusses the mind body connection.  Reviews various relaxation techniques to help reduce and manage stress (i.e. Deep breathing, progressive muscle relaxation, and visualization). Balance handout provided to take home. Written material given at graduation. Flowsheet Row Cardiac Rehab from 10/24/2022 in [facility] Cardiac and Pulmonary Rehab  Date 10/24/22  Educator NT  Instruction Review Code 1- Verbalizes Understanding       Activity Barriers & Risk Stratification:  Activity Barriers & Cardiac  Risk Stratification - 10/14/22 1615       Activity Barriers & Cardiac Risk Stratification   Activity Barriers Back Problems;Balance Concerns;History of Falls;Other (comment)    Comments Dizziness    Cardiac Risk Stratification High             6 Minute Walk:  6 Minute Walk     Row Name 10/14/22 1612         6 Minute Walk   Phase Initial     Distance 1090 feet     Walk Time 6 minutes     # of Rest Breaks 0     MPH 2.06     METS 2.24     RPE 9     Perceived Dyspnea  0     VO2 Peak 7.82     Symptoms Yes (comment)     Comments Back pain 3/10     Resting HR 83 bpm     Resting BP 110/64     Resting Oxygen Saturation  98 %     Exercise Oxygen Saturation  during 6 min walk 97 %     Max Ex. HR 104 bpm     Max Ex. BP 110/62     2 Minute Post BP 100/72              Oxygen Initial Assessment:   Oxygen Re-Evaluation:   Oxygen Discharge (Final Oxygen Re-Evaluation):   Initial Exercise Prescription:  Initial Exercise Prescription - 11/05/22 1300       Recumbant Bike   METs 3.49      NuStep   Level 3    Minutes 15    METs 2.7      REL-XR   Level 6    Minutes 15    METs 2.5      T5 Nustep   Level 2    Minutes 15    METs 2.1             Perform Capillary Blood Glucose checks as needed.  Exercise Prescription Changes:   Exercise Prescription Changes     Row Name 10/14/22 1600 10/22/22 1100 11/05/22 1300          Response to Exercise   Blood Pressure (Admit) 110/64 104/52 96/58     Blood Pressure (Exercise) 110/62 128/64 136/56     Blood Pressure (Exit) 100/72 104/66 84/50     Heart Rate (Admit) 83 bpm 79 bpm 77 bpm     Heart Rate (Exercise) 104 bpm 100 bpm 108 bpm     Heart Rate (Exit) 90 bpm 91 bpm 88 bpm     Oxygen Saturation (Admit) 98 % -- --     Oxygen Saturation (Exercise) 97 % -- --     Oxygen Saturation (Exit) 97 % -- --     Rating of Perceived Exertion (Exercise) 9 11 13      Perceived Dyspnea (Exercise) 0 -- --     Symptoms Back pain -- --     Comments Results -- --     Duration -- -- Continue with 30 min of aerobic exercise without signs/symptoms of physical distress.     Intensity -- -- THRR unchanged       Progression   Progression -- -- Continue to progress workloads to maintain intensity without signs/symptoms of physical distress.     Average METs -- -- 2.7       Resistance Training   Training Prescription -- Yes Yes  Weight -- 5lbs 5 lb     Reps -- 10-15 10-15       Interval Training   Interval Training -- -- No       Treadmill   MPH -- -- 1.4     Grade -- -- 0     Minutes -- -- 15     METs -- -- 2.07       Recumbant Bike   Level -- 3 10     RPM -- 50 --     Minutes -- 15 15       NuStep   Level -- 2 --     SPM -- 80 --     Minutes -- 15 --       T5 Nustep   Level -- 2 --     SPM -- 80 --     Minutes -- 15 --       Oxygen   Maintain Oxygen Saturation -- -- 88% or higher              Exercise Comments:   Exercise Comments     Row Name 10/15/22 1131           Exercise Comments First full day of exercise!  Patient was oriented to gym and equipment including functions, settings, policies, and procedures.  Patient's individual exercise prescription and treatment plan were reviewed.  All starting workloads were established based on the results of the 6 minute walk test done at initial orientation visit.  The plan for exercise  progression was also introduced and progression will be customized based on patient's performance and goals.                Exercise Goals and Review:   Exercise Goals     Row Name 10/14/22 1647             Exercise Goals   Increase Physical Activity Yes       Intervention Provide advice, education, support and counseling about physical activity/exercise needs.;Develop an individualized exercise prescription for aerobic and resistive training based on initial evaluation findings, risk stratification, comorbidities and participant's personal goals.       Expected Outcomes Short Term: Attend rehab on a regular basis to increase amount of physical activity.;Long Term: Exercising regularly at least 3-5 days a week.;Long Term: Add in home exercise to make exercise part of routine and to increase amount of physical activity.       Increase Strength and Stamina Yes       Intervention Provide advice, education, support and counseling about physical activity/exercise needs.;Develop an individualized exercise prescription for aerobic and resistive training based on initial evaluation findings, risk stratification, comorbidities and participant's personal goals.       Expected Outcomes Short Term: Increase workloads from initial exercise prescription for resistance, speed, and METs.;Short Term: Perform resistance training exercises routinely during rehab and add in resistance training at home;Long Term: Improve cardiorespiratory fitness, muscular endurance and strength as measured by increased METs and functional capacity ( )       Able to understand and use rate of perceived exertion (RPE) scale Yes       Intervention Provide education and explanation on how to use RPE scale       Expected Outcomes Short Term: Able to use RPE daily in rehab to express subjective intensity level;Long Term:  Able to use RPE to guide intensity level when exercising independently       Able to understand  and use  Dyspnea scale Yes       Intervention Provide education and explanation on how to use Dyspnea scale       Expected Outcomes Short Term: Able to use Dyspnea scale daily in rehab to express subjective sense of shortness of breath during exertion;Long Term: Able to use Dyspnea scale to guide intensity level when exercising independently       Knowledge and understanding of Target Heart Rate Range (THRR) Yes       Intervention Provide education and explanation of THRR including how the numbers were predicted and where they are located for reference       Expected Outcomes Short Term: Able to state/look up THRR;Long Term: Able to use THRR to govern intensity when exercising independently;Short Term: Able to use daily as guideline for intensity in rehab       Able to check pulse independently Yes       Intervention Provide education and demonstration on how to check pulse in carotid and radial arteries.;Review the importance of being able to check your own pulse for safety during independent exercise       Expected Outcomes Short Term: Able to explain why pulse checking is important during independent exercise;Long Term: Able to check pulse independently and accurately       Understanding of Exercise Prescription Yes       Intervention Provide education, explanation, and written materials on patient's individual exercise prescription       Expected Outcomes Short Term: Able to explain program exercise prescription;Long Term: Able to explain home exercise prescription to exercise independently                Exercise Goals Re-Evaluation :  Exercise Goals Re-Evaluation     Row Name 10/15/22 1131 10/22/22 1132 11/05/22 1346         Exercise Goal Re-Evaluation   Exercise Goals Review Able to understand and use rate of perceived exertion (RPE) scale;Able to understand and use Dyspnea scale;Understanding of Exercise Prescription Increase Physical Activity;Understanding of Exercise Prescription;Increase  Strength and Stamina Increase Physical Activity;Understanding of Exercise Prescription;Increase Strength and Stamina     Comments Reviewed RPE  and dyspnea scale, THR and program prescription with pt today.  Pt voiced understanding and was given a copy of goals to take home. Rodney Hall is off to a good start in the program. He has increased the bike workload. We will continue to monitor his progress in the program Rodney Hall is doing well in rehab. He began walking the treadmill at a speed of 1.4 mph with no incline and did well. He also improved to level 10 on the recumbent bike, level 3 on the T4 nustep, and level 6 on the XR. We will continue to monitor his progress in the program.     Expected Outcomes Short: Use RPE daily to regulate intensity. Long: Follow program prescription in THR. Short: Continue to follow current exercise prescription and progressivley increase bike workload. Long: Continue exercise to improve strength and stamina. Short: Continue to progressively increase treadmill workload. Long: Continue exercise to improve strength and stamina.              Discharge Exercise Prescription (Final Exercise Prescription Changes):  Exercise Prescription Changes - 11/05/22 1300       Response to Exercise   Blood Pressure (Admit) 96/58    Blood Pressure (Exercise) 136/56    Blood Pressure (Exit) 84/50    Heart Rate (Admit) 77 bpm    Heart  Rate (Exercise) 108 bpm    Heart Rate (Exit) 88 bpm    Rating of Perceived Exertion (Exercise) 13    Duration Continue with 30 min of aerobic exercise without signs/symptoms of physical distress.    Intensity THRR unchanged      Progression   Progression Continue to progress workloads to maintain intensity without signs/symptoms of physical distress.    Average METs 2.7      Resistance Training   Training Prescription Yes    Weight 5 lb    Reps 10-15      Interval Training   Interval Training No      Treadmill   MPH 1.4    Grade 0    Minutes 15     METs 2.07      Recumbant Bike   Level 10    Minutes 15      Oxygen   Maintain Oxygen Saturation 88% or higher             Nutrition:  Target Goals: Understanding of nutrition guidelines, daily intake of sodium 1500mg , cholesterol 200mg , calories 30% from fat and 7% or less from saturated fats, daily to have 5 or more servings of fruits and vegetables.  Education: All About Nutrition: -Group instruction provided by verbal, written material, interactive activities, discussions, models, and posters to present general guidelines for heart healthy nutrition including fat, fiber, MyPlate, the role of sodium in heart healthy nutrition, utilization of the nutrition label, and utilization of this knowledge for meal planning. Follow up email sent as well. Written material given at graduation. Flowsheet Row Cardiac Rehab from 10/24/2022 in [facility] Cardiac and Pulmonary Rehab  Education need identified 10/14/22       Biometrics:  Pre Biometrics - 10/14/22 1647       Pre Biometrics   Height 5' 11.5" (1.816 m)    Weight 184 lb 14.4 oz (83.9 kg)    Waist Circumference 40.5 inches    Hip Circumference 40.5 inches    Waist to Hip Ratio 1 %    BMI (Calculated) 25.43    Single Leg Stand 2 seconds              Nutrition Therapy Plan and Nutrition Goals:  Nutrition Therapy & Goals - 10/14/22 1650       Nutrition Therapy   RD appointment deferred Yes      Intervention Plan   Intervention Prescribe, educate and counsel regarding individualized specific dietary modifications aiming towards targeted core components such as weight, hypertension, lipid management, diabetes, heart failure and other comorbidities.    Expected Outcomes Short Term Goal: A plan has been developed with personal nutrition goals set during dietitian appointment.;Long Term Goal: Adherence to prescribed nutrition plan.;Short Term Goal: Understand basic principles of dietary content, such as calories, fat, sodium,  cholesterol and nutrients.             Nutrition Assessments:  MEDIFICTS Score Key: ?70 Need to make dietary changes  40-70 Heart Healthy Diet ? 40 Therapeutic Level Cholesterol Diet  Flowsheet Row Cardiac Rehab from 10/14/2022 in [facility] Cardiac and Pulmonary Rehab  Picture Your Plate Total Score on Admission 76      Picture Your Plate Scores: <16 Unhealthy dietary pattern with much room for improvement. 41-50 Dietary pattern unlikely to meet recommendations for good health and room for improvement. 51-60 More healthful dietary pattern, with some room for improvement.  >60 Healthy dietary pattern, although there may be some specific behaviors that could be  improved.    Nutrition Goals Re-Evaluation:   Nutrition Goals Discharge (Final Nutrition Goals Re-Evaluation):   Psychosocial: Target Goals: Acknowledge presence or absence of significant depression and/or stress, maximize coping skills, provide positive support system. Participant is able to verbalize types and ability to use techniques and skills needed for reducing stress and depression.   Education: Stress, Anxiety, and Depression - Group verbal and visual presentation to define topics covered.  Reviews how body is impacted by stress, anxiety, and depression.  Also discusses healthy ways to reduce stress and to treat/manage anxiety and depression.  Written material given at graduation.   Education: Sleep Hygiene -Provides group verbal and written instruction about how sleep can affect your health.  Define sleep hygiene, discuss sleep cycles and impact of sleep habits. Review good sleep hygiene tips.    Initial Review & Psychosocial Screening:  Initial Psych Review & Screening - 10/10/22 1111       Initial Review   Current issues with None Identified      Family Dynamics   Good Support System? Yes    Comments He can look to his wife for support. Now that his health is back under control he does not have any  stressors or deprression.      Barriers   Psychosocial barriers to participate in program The patient should benefit from training in stress management and relaxation.;There are no identifiable barriers or psychosocial needs.      Screening Interventions   Interventions Encouraged to exercise;To provide support and resources with identified psychosocial needs;Provide feedback about the scores to participant    Expected Outcomes Short Term goal: Utilizing psychosocial counselor, staff and physician to assist with identification of specific Stressors or current issues interfering with healing process. Setting desired goal for each stressor or current issue identified.;Long Term Goal: Stressors or current issues are controlled or eliminated.;Short Term goal: Identification and review with participant of any Quality of Life or Depression concerns found by scoring the questionnaire.;Long Term goal: The participant improves quality of Life and PHQ9 Scores as seen by post scores and/or verbalization of changes             Quality of Life Scores:   Quality of Life - 10/14/22 1652       Quality of Life   Select Quality of Life      Quality of Life Scores   Health/Function Pre 19.12 %    Socioeconomic Pre 27.92 %    Psych/Spiritual Pre 28.07 %    Family Pre 30 %    GLOBAL Pre 24.6 %            Scores of 19 and below usually indicate a poorer quality of life in these areas.  A difference of  2-3 points is a clinically meaningful difference.  A difference of 2-3 points in the total score of the Quality of Life Index has been associated with significant improvement in overall quality of life, self-image, physical symptoms, and general health in studies assessing change in quality of life.  PHQ-9: Review Flowsheet  More data exists      10/14/2022 10/11/2022 07/03/2022 10/10/2021 10/09/2021  Depression screen PHQ 2/9  Decreased Interest 0 0 0 0 0 0  Down, Depressed, Hopeless 0 0 0 0 0 0  PHQ - 2  Score 0 0 0 0 0 0  Altered sleeping 1 0 - - 0  Tired, decreased energy 0 0 - - 0  Change in appetite 0 0 - -  0  Feeling bad or failure about yourself  0 0 - - 0  Trouble concentrating 0 1 - - 0  Moving slowly or fidgety/restless 0 0 - - 0  Suicidal thoughts 0 0 - - 0  PHQ-9 Score 1 1 - - 0  Difficult doing work/chores - Not difficult at all - - Not difficult at all    Details       Multiple values from one day are sorted in reverse-chronological order        Interpretation of Total Score  Total Score Depression Severity:  1-4 = Minimal depression, 5-9 = Mild depression, 10-14 = Moderate depression, 15-19 = Moderately severe depression, 20-27 = Severe depression   Psychosocial Evaluation and Intervention:  Psychosocial Evaluation - 10/10/22 1112       Psychosocial Evaluation & Interventions   Interventions Encouraged to exercise with the program and follow exercise prescription;Relaxation education;Stress management education    Comments He can look to his wife for support. Now that his health is back under control he does not have any stressors or deprression.    Expected Outcomes Short: Start [employer] to help with mood. Long: Maintain a healthy mental state    Continue Psychosocial Services  Follow up required by staff             Psychosocial Re-Evaluation:   Psychosocial Discharge (Final Psychosocial Re-Evaluation):   Vocational Rehabilitation: Provide vocational rehab assistance to qualifying candidates.   Vocational Rehab Evaluation & Intervention:   Education: Education Goals: Education classes will be provided on a variety of topics geared toward better understanding of heart health and risk factor modification. Participant will state understanding/return demonstration of topics presented as noted by education test scores.  Learning Barriers/Preferences:  Learning Barriers/Preferences - 10/10/22 1110       Learning Barriers/Preferences   Learning  Barriers None    Learning Preferences None             General Cardiac Education Topics:  AED/CPR: - Group verbal and written instruction with the use of models to demonstrate the basic use of the AED with the basic ABC's of resuscitation.   Anatomy and Cardiac Procedures: - Group verbal and visual presentation and models provide information about basic cardiac anatomy and function. Reviews the testing methods done to diagnose heart disease and the outcomes of the test results. Describes the treatment choices: Medical Management, Angioplasty, or Coronary Bypass Surgery for treating various heart conditions including Myocardial Infarction, Angina, Valve Disease, and Cardiac Arrhythmias.  Written material given at graduation. Flowsheet Row Cardiac Rehab from 10/24/2022 in [facility] Cardiac and Pulmonary Rehab  Education need identified 10/14/22       Medication Safety: - Group verbal and visual instruction to review commonly prescribed medications for heart and lung disease. Reviews the medication, class of the drug, and side effects. Includes the steps to properly store meds and maintain the prescription regimen.  Written material given at graduation.   Intimacy: - Group verbal instruction through game format to discuss how heart and lung disease can affect sexual intimacy. Written material given at graduation.. Flowsheet Row Cardiac Rehab from 10/24/2022 in [facility] Cardiac and Pulmonary Rehab  Date 10/17/22  Educator NT  Instruction Review Code 1- Verbalizes Understanding       Know Your Numbers and Heart Failure: - Group verbal and visual instruction to discuss disease risk factors for cardiac and pulmonary disease and treatment options.  Reviews associated critical values for Overweight/Obesity, Hypertension, Cholesterol, and Diabetes.  Discusses basics of heart failure: signs/symptoms and treatments.  Introduces Heart Failure Zone chart for action plan for heart failure.  Written  material given at graduation. Flowsheet Row Cardiac Rehab from 10/24/2022 in [facility] Cardiac and Pulmonary Rehab  Education need identified 10/14/22       Infection Prevention: - Provides verbal and written material to individual with discussion of infection control including proper hand washing and proper equipment cleaning during exercise session. Flowsheet Row Cardiac Rehab from 10/24/2022 in [facility] Cardiac and Pulmonary Rehab  Date 10/14/22  Educator MB  Instruction Review Code 1- Verbalizes Understanding       Falls Prevention: - Provides verbal and written material to individual with discussion of falls prevention and safety. Flowsheet Row Cardiac Rehab from 10/24/2022 in [facility] Cardiac and Pulmonary Rehab  Date 10/14/22  Educator MB  Instruction Review Code 1- Verbalizes Understanding       Other: -Provides group and verbal instruction on various topics (see comments)   Knowledge Questionnaire Score:  Knowledge Questionnaire Score - 10/14/22 1654       Knowledge Questionnaire Score   Pre Score 20/26             Core Components/Risk Factors/Patient Goals at Admission:  Personal Goals and Risk Factors at Admission - 10/14/22 1658       Core Components/Risk Factors/Patient Goals on Admission    Weight Management Yes;Weight Maintenance    Intervention Weight Management: Develop a combined nutrition and exercise program designed to reach desired caloric intake, while maintaining appropriate intake of nutrient and fiber, sodium and fats, and appropriate energy expenditure required for the weight goal.;Weight Management: Provide education and appropriate resources to help participant work on and attain dietary goals.;Weight Management/Obesity: Establish reasonable short term and long term weight goals.    Admit Weight 184 lb 14.4 oz (83.9 kg)    Goal Weight: Short Term 184 lb (83.5 kg)    Goal Weight: Long Term 184 lb (83.5 kg)    Expected Outcomes Short Term: Continue to  assess and modify interventions until short term weight is achieved;Long Term: Adherence to nutrition and physical activity/exercise program aimed toward attainment of established weight goal;Weight Maintenance: Understanding of the daily nutrition guidelines, which includes 25-35% calories from fat, 7% or less cal from saturated fats, less than 200mg  cholesterol, less than 1.5gm of sodium, & 5 or more servings of fruits and vegetables daily;Understanding recommendations for meals to include 15-35% energy as protein, 25-35% energy from fat, 35-60% energy from carbohydrates, less than 200mg  of dietary cholesterol, 20-35 gm of total fiber daily;Understanding of distribution of calorie intake throughout the day with the consumption of 4-5 meals/snacks    Heart Failure Yes    Intervention Provide a combined exercise and nutrition program that is supplemented with education, support and counseling about heart failure. Directed toward relieving symptoms such as shortness of breath, decreased exercise tolerance, and extremity edema.    Expected Outcomes Improve functional capacity of life;Short term: Attendance in program 2-3 days a week with increased exercise capacity. Reported lower sodium intake. Reported increased fruit and vegetable intake. Reports medication compliance.;Short term: Daily weights obtained and reported for increase. Utilizing diuretic protocols set by physician.;Long term: Adoption of self-care skills and reduction of barriers for early signs and symptoms recognition and intervention leading to self-care maintenance.    Lipids Yes    Intervention Provide education and support for participant on nutrition & aerobic/resistive exercise along with prescribed medications to achieve LDL 70mg , HDL >40mg .  Expected Outcomes Short Term: Participant states understanding of desired cholesterol values and is compliant with medications prescribed. Participant is following exercise prescription and  nutrition guidelines.;Long Term: Cholesterol controlled with medications as prescribed, with individualized exercise RX and with personalized nutrition plan. Value goals: LDL < 70mg , HDL > 40 mg.             Education:Diabetes - Individual verbal and written instruction to review signs/symptoms of diabetes, desired ranges of glucose level fasting, after meals and with exercise. Acknowledge that pre and post exercise glucose checks will be done for 3 sessions at entry of program.   Core Components/Risk Factors/Patient Goals Review:    Core Components/Risk Factors/Patient Goals at Discharge (Final Review):    ITP Comments:  ITP Comments     Row Name 10/10/22 1110 10/14/22 1611 10/14/22 1705 10/15/22 1131 11/06/22 0913   ITP Comments Virtual Visit completed. Patient informed on EP and RD appointment and 6 Minute walk test. Patient also informed of patient health questionnaires on My Chart. Patient Verbalizes understanding. Visit diagnosis can be found in [facility] 09/10/2022. Completed and gym orientation. Initial ITP created and sent for review to Dr. Bethann Punches, Medical Director. Completed and gym orientation. Initial ITP created and sent for review to Dr. Bethann Punches, Medical Director. First full day of exercise!  Patient was oriented to gym and equipment including functions, settings, policies, and procedures.  Patient's individual exercise prescription and treatment plan were reviewed.  All starting workloads were established based on the results of the 6 minute walk test done at initial orientation visit.  The plan for exercise progression was also introduced and progression will be customized based on patient's performance and goals. 30 Day review completed. Medical Director ITP review done, changes made as directed, and signed approval by Medical Director.     new to program            Comments:

## 2022-11-06 NOTE — Patient Instructions (Signed)
Medication Instructions:  Your Physician recommend you continue on your current medication as directed.    *If you need a refill on your cardiac medications before your next appointment, please call your pharmacy*  Lab Work: NONE If you have labs (blood work) drawn today and your tests are completely normal, you will receive your results only by: MyChart Message (if you have MyChart) OR A paper copy in the mail If you have any lab test that is abnormal or we need to change your treatment, we will call you to review the results.  Testing/Procedures: NONE  Follow-Up: At [facility], you and your health needs are our priority.  As part of our continuing mission to provide you with exceptional heart care, we have created designated Provider Care Teams.  These Care Teams include your primary Cardiologist (physician) and Advanced Practice Providers (APPs -  Physician Assistants and Nurse Practitioners) who all work together to provide you with the care you need, when you need it.  We recommend signing up for the patient portal called "MyChart".  Sign up information is provided on this After Visit Summary.  MyChart is used to connect with patients for Virtual Visits (Telemedicine).  Patients are able to view lab/test results, encounter notes, upcoming appointments, etc.  Non-urgent messages can be sent to your provider as well.   To learn more about what you can do with MyChart, go to [email or web address].    Your next appointment:   3 month(s)  Provider:   You may see Julien Nordmann, MD or one of the following Advanced Practice Providers on your designated Care Team:   Eula Listen, [place]

## 2022-11-07 ENCOUNTER — Telehealth: Payer: Self-pay | Admitting: Pharmacist Clinician (PhC)/ Clinical Pharmacy Specialist

## 2022-11-07 ENCOUNTER — Encounter: Payer: Self-pay | Admitting: Pharmacist Clinician (PhC)/ Clinical Pharmacy Specialist

## 2022-11-07 ENCOUNTER — Other Ambulatory Visit (HOSPITAL_COMMUNITY): Payer: Self-pay

## 2022-11-07 ENCOUNTER — Telehealth: Payer: Self-pay

## 2022-11-07 ENCOUNTER — Ambulatory Visit (INDEPENDENT_AMBULATORY_CARE_PROVIDER_SITE_OTHER): Payer: HMO | Admitting: Pharmacist Clinician (PhC)/ Clinical Pharmacy Specialist

## 2022-11-07 DIAGNOSIS — E782 Mixed hyperlipidemia: Secondary | ICD-10-CM | POA: Diagnosis not present

## 2022-11-07 NOTE — Telephone Encounter (Signed)
PA request has been Submitted. PA currently Pending.. New Encounter created for follow up. For additional info see Pharmacy Prior Auth telephone encounter from 11/07/22.

## 2022-11-07 NOTE — Patient Instructions (Signed)
Your Results:             Your most recent labs Goal  Total Cholesterol 147 < 200  Triglycerides 177 < 150  HDL (happy/good cholesterol) 32 > 40  LDL (lousy/bad cholesterol 80 < 55   Medication changes:  We will start the process to get Repatha covered by your insurance.  Once the prior authorization is complete, I will send a MyChart message to let you know and confirm pharmacy information.   You will take one injection every 14 days  Lab orders:  We want to repeat labs after 2-3 months.  We will send you a lab order to remind you once we get closer to that time.     Thank you for choosing [facility]

## 2022-11-07 NOTE — Assessment & Plan Note (Signed)
Assessment: Patient with ASCVD not at LDL goal of < 55 Most recent LDL 80 on 09/04/22 Not able to tolerate statins secondary to anxiety, jitteriness Reviewed options for lowering LDL cholesterol, including ezetimibe, PCSK-9 inhibitors, bempedoic acid and inclisiran.  Discussed mechanisms of action, dosing, side effects, potential decreases in LDL cholesterol and costs.  Also reviewed potential options for patient assistance.  Plan: Patient agreeable to starting Repatha 140 mg  Repeat labs after:  3 months Lipid Liver function

## 2022-11-07 NOTE — Telephone Encounter (Signed)
Pharmacy Patient Advocate Encounter   Received notification from Physician's Office that prior authorization for REPATHA is required/requested.   Insurance verification completed.   The patient is insured through [employer] .   Per test claim: PA required; PA submitted to [employer] via CoverMyMeds Key/confirmation #/EOC [id number] Status is pending

## 2022-11-07 NOTE — Telephone Encounter (Signed)
Please do PA for Repatha 

## 2022-11-07 NOTE — Progress Notes (Signed)
Office Visit    Patient Name: Rodney Hall. Date of Encounter: 11/07/2022  Primary Care Provider:  Malva Limes, MD Primary Cardiologist:  Julien Nordmann, MD  Chief Complaint    Hyperlipidemia   Significant Past Medical History   CAD 09/2022 NSTEMI, CABG x 5  CKD Stage 3, GFR, had AKI in hospital  AF Post surgery, CHADS2-VASc           Allergies  Allergen Reactions   Pravastatin     jittery   Amoxicillin Rash    ++ got ceftriaxone in the past++ Has patient had a PCN reaction causing immediate rash, facial/tongue/throat swelling, SOB or lightheadedness with hypotension: Yes Has patient had a PCN reaction causing severe rash involving mucus membranes or skin necrosis: No Has patient had a PCN reaction that required hospitalization No Has patient had a PCN reaction occurring within the last 10 years: No If all of the above answers are "NO", then may proceed with Cephalosporin use.      History of Present Illness    Rodney Hall. is a 77 y.o. male patient of Dr Mariah Milling, in the office today to discuss options for cholesterol management.  Insurance Carrier: [employer]  [phone]  $47 per month, $94 for 3 months  ~$136 in the coverage gap  Pharmcy: [facility] on [address] [place]  LDL Cholesterol goal:  LDL < 55  Current Medications: none    Previously tried:  pravastatin, atorvastatin - anxiety, jitteriness  Family Hx:  father had MI at 18, DM with amputation; sister had rheumatic fever x 2, heart disease; mother died brain cancer in her 40's; children (2) healthy  Social Hx: Tobacco: no Alcohol: no  Diet:   mostly home cooked meals, changedd iet with CKD dx, and has lost about 15 pounds; fish more than meats; vegetables f/f, Boost once daily; whole grain breads    Exercise: cardiac rehab  Adherence Assessment  Do you ever forget to take your medication? [] Yes [x] No  Do you ever skip doses due to side effects? [] Yes [x] No  Do you  have trouble affording your medicines? [] Yes [x] No  Are you ever unable to pick up your medication due to transportation difficulties? [] Yes [x] No  Do you ever stop taking your medications because you don't believe they are helping? [] Yes [x] No   Adherence strategy: wife organizes medication  Barriers to obtaining medications: none  Accessory Clinical Findings   Lab Results  Component Value Date   CHOL 147 09/04/2022   HDL 32 (L) 09/04/2022   LDLCALC 80 09/04/2022   TRIG 177 (H) 09/04/2022   CHOLHDL 4.6 09/04/2022    Lipoprotein (a)  Date/Time Value Ref Range Status  09/05/2022 03:37 AM 19.3 <75.0 nmol/L Final    Comment:    (NOTE) Note:  Values greater than or equal to 75.0 nmol/L may       indicate an independent risk factor for CHD,       but must be evaluated with caution when applied       to non-Caucasian populations due to the       influence of genetic factors on Lp(a) across       ethnicities. Performed At: [facility] [address] [place], [place] [id number] Jolene Schimke MD [id number]     Lab Results  Component Value Date   ALT 13 10/10/2021   AST 17 10/10/2021   ALKPHOS 77 10/10/2021   BILITOT 0.4 10/10/2021   Lab  Results  Component Value Date   CREATININE 1.36 (H) 10/01/2022   BUN 14 10/01/2022   NA 138 10/01/2022   K 4.5 10/01/2022   CL 102 10/01/2022   CO2 22 10/01/2022   Lab Results  Component Value Date   HGBA1C 5.7 (H) 09/06/2022    Home Medications    Current Outpatient Medications  Medication Sig Dispense Refill   acetaminophen (TYLENOL) 325 MG tablet Take 650 mg by mouth every 6 (six) hours as needed for moderate pain or headache.     amiodarone (PACERONE) 200 MG tablet Take 1 tablet (200 mg total) by mouth 2 (two) times daily. X 7 days, then decrease to 200 mg daily (Patient not taking: Reported on 11/06/2022) 60 tablet 0   aspirin EC 81 MG tablet Take 1 tablet (81 mg total) by mouth daily. Swallow whole. 30 tablet 12    clopidogrel (PLAVIX) 75 MG tablet Take 1 tablet (75 mg total) by mouth daily. 30 tablet 3   imipramine (TOFRANIL) 50 MG tablet Take 50 mg by mouth at bedtime.     tamsulosin (FLOMAX) 0.4 MG CAPS capsule Take 0.4 mg by mouth daily after supper.      traMADol (ULTRAM) 50 MG tablet Take 1 tablet (50 mg total) by mouth every 6 (six) hours as needed for moderate pain. 30 tablet 0   No current facility-administered medications for this visit.     Assessment & Plan    Hyperlipidemia, mixed Assessment: Patient with ASCVD not at LDL goal of < 55 Most recent LDL 80 on 09/04/22 Not able to tolerate statins secondary to anxiety, jitteriness Reviewed options for lowering LDL cholesterol, including ezetimibe, PCSK-9 inhibitors, bempedoic acid and inclisiran.  Discussed mechanisms of action, dosing, side effects, potential decreases in LDL cholesterol and costs.  Also reviewed potential options for patient assistance.  Plan: Patient agreeable to starting Repatha 140 mg  Repeat labs after:  3 months Lipid Liver function    Phillips Hay, PharmD CPP [facility] [address]  [place], [place] [address] [phone]  11/07/2022, 2:02 PM

## 2022-11-08 ENCOUNTER — Other Ambulatory Visit (HOSPITAL_COMMUNITY): Payer: Self-pay

## 2022-11-08 MED ORDER — REPATHA SURECLICK 140 MG/ML ~~LOC~~ SOAJ: 140.0000 mg | SUBCUTANEOUS | 3 refills | Status: DC

## 2022-11-08 NOTE — Telephone Encounter (Signed)
Rx approved and sent 

## 2022-11-08 NOTE — Addendum Note (Signed)
Addended by: Rosalee Kaufman on: 11/08/2022 04:54 PM   Modules accepted: Orders

## 2022-11-08 NOTE — Telephone Encounter (Signed)
Pharmacy Patient Advocate Encounter  Received notification from [employer] that Prior Authorization for repatha has been APPROVED from 11/07/22  to 05/07/23. Ran test claim, Copay is $47.00. This test claim was processed through [facility]- copay amounts may vary at other pharmacies due to pharmacy/plan contracts, or as the patient moves through the different stages of their insurance plan.   PA #/Case ID/Reference #: [id number]

## 2022-11-12 ENCOUNTER — Encounter: Payer: HMO | Admitting: *Deleted

## 2022-11-12 DIAGNOSIS — Z951 Presence of aortocoronary bypass graft: Secondary | ICD-10-CM | POA: Diagnosis not present

## 2022-11-12 NOTE — Progress Notes (Signed)
Daily Session Note  Patient Details  Name: Rodney Hall. MRN: [id number] Date of Birth: 12/03/1945 Referring Provider:   Flowsheet Row Cardiac Rehab from 10/14/2022 in [facility] Cardiac and Pulmonary Rehab  Referring Provider Dr. Julien Nordmann       Encounter Date: 11/12/2022  Check In:  Session Check In - 11/12/22 1141       Check-In   Supervising physician immediately available to respond to emergencies See telemetry face sheet for immediately available ER MD    Location [facility]-Cardiac & Pulmonary Rehab    Staff Present Cora Collum, RN, BSN, CCRP;Noah Tickle, BS, Exercise Physiologist;Meredith Jewel Baize, RN BSN;Margaret Best, MS, Exercise Physiologist;Maxon Conetta BS, , Exercise Physiologist    Virtual Visit No    Medication changes reported     No    Fall or balance concerns reported    No    Warm-up and Cool-down Performed on first and last piece of equipment    Resistance Training Performed Yes    VAD Patient? No    PAD/SET Patient? No      Pain Assessment   Currently in Pain? No/denies                Social History   Tobacco Use  Smoking Status Never  Smokeless Tobacco Never    Goals Met:  Independence with exercise equipment Exercise tolerated well No report of concerns or symptoms today  Goals Unmet:  Not Applicable  Comments: Pt able to follow exercise prescription today without complaint.  Will continue to monitor for progression.    Dr. Bethann Punches is Medical Director for [facility] Cardiac Rehabilitation.  Dr. Vida Rigger is Medical Director for [facility] Pulmonary Rehabilitation.

## 2022-11-14 ENCOUNTER — Encounter: Payer: HMO | Admitting: *Deleted

## 2022-11-14 DIAGNOSIS — Z951 Presence of aortocoronary bypass graft: Secondary | ICD-10-CM

## 2022-11-14 NOTE — Progress Notes (Signed)
Daily Session Note  Patient Details  Name: Rodney Hall. MRN: [id number] Date of Birth: 01/14/1946 Referring Provider:   Flowsheet Row Cardiac Rehab from 10/14/2022 in [facility] Cardiac and Pulmonary Rehab  Referring Provider Dr. Julien Nordmann       Encounter Date: 11/14/2022  Check In:  Session Check In - 11/14/22 1129       Check-In   Supervising physician immediately available to respond to emergencies See telemetry face sheet for immediately available ER MD    Location [facility]-Cardiac & Pulmonary Rehab    Staff Present Ronette Deter, BS, Exercise Physiologist;Meredith Jewel Baize, RN BSN;Joseph Shelbie Proctor, RN, [place]    Virtual Visit No    Medication changes reported     No    Fall or balance concerns reported    No    Warm-up and Cool-down Performed on first and last piece of equipment    Resistance Training Performed Yes    VAD Patient? No    PAD/SET Patient? No      Pain Assessment   Currently in Pain? No/denies                Social History   Tobacco Use  Smoking Status Never  Smokeless Tobacco Never    Goals Met:  Independence with exercise equipment Exercise tolerated well No report of concerns or symptoms today Strength training completed today  Goals Unmet:  Not Applicable  Comments: Pt able to follow exercise prescription today without complaint.  Will continue to monitor for progression.    Dr. Bethann Punches is Medical Director for [facility] Cardiac Rehabilitation.  Dr. Vida Rigger is Medical Director for [facility] Pulmonary Rehabilitation.

## 2022-11-19 ENCOUNTER — Encounter: Payer: HMO | Admitting: *Deleted

## 2022-11-19 DIAGNOSIS — Z951 Presence of aortocoronary bypass graft: Secondary | ICD-10-CM

## 2022-11-19 NOTE — Progress Notes (Signed)
Daily Session Note  Patient Details  Name: Rodney Hall. MRN: [id number] Date of Birth: [birth year] Referring Provider:   Flowsheet Row Cardiac Rehab from 10/14/2022 in [facility] Cardiac and Pulmonary Rehab  Referring Provider Dr. Julien Nordmann       Encounter Date: 11/19/2022  Check In:  Session Check In - 11/19/22 1147       Check-In   Supervising physician immediately available to respond to emergencies See telemetry face sheet for immediately available ER MD    Location [facility]-Cardiac & Pulmonary Rehab    Staff Present Maxon Conetta BS, , Exercise Physiologist;Meredith Jewel Baize, RN BSN;Noah Tickle, BS, Exercise Physiologist;Deloris Mittag, RN, BSN, CCRP    Virtual Visit No    Medication changes reported     No    Fall or balance concerns reported    No    Warm-up and Cool-down Performed on first and last piece of equipment    Resistance Training Performed Yes    VAD Patient? No    PAD/SET Patient? No      Pain Assessment   Currently in Pain? No/denies                Social History   Tobacco Use  Smoking Status Never  Smokeless Tobacco Never    Goals Met:  Independence with exercise equipment Exercise tolerated well No report of concerns or symptoms today  Goals Unmet:  Not Applicable  Comments: Pt able to follow exercise prescription today without complaint.  Will continue to monitor for progression.    Dr. Bethann Punches is Medical Director for [facility] Cardiac Rehabilitation.  Dr. Vida Rigger is Medical Director for [facility] Pulmonary Rehabilitation.

## 2022-11-21 ENCOUNTER — Encounter: Payer: HMO | Admitting: *Deleted

## 2022-11-21 DIAGNOSIS — Z951 Presence of aortocoronary bypass graft: Secondary | ICD-10-CM | POA: Diagnosis not present

## 2022-11-21 NOTE — Telephone Encounter (Signed)
Error

## 2022-11-21 NOTE — Progress Notes (Signed)
Daily Session Note  Patient Details  Name: Rodney Hall. MRN: [id number] Date of Birth: 1946/02/06 Referring Provider:   Flowsheet Row Cardiac Rehab from 10/14/2022 in [facility] Cardiac and Pulmonary Rehab  Referring Provider Dr. Julien Nordmann       Encounter Date: 11/21/2022  Check In:  Session Check In - 11/21/22 1123       Check-In   Supervising physician immediately available to respond to emergencies See telemetry face sheet for immediately available ER MD    Location [facility]-Cardiac & Pulmonary Rehab    Staff Present Ronette Deter, BS, Exercise Physiologist;Meredith Jewel Baize, RN BSN;Joseph Shelbie Proctor, RN, [place]    Virtual Visit No    Medication changes reported     No    Fall or balance concerns reported    No    Warm-up and Cool-down Performed on first and last piece of equipment    Resistance Training Performed Yes    VAD Patient? No    PAD/SET Patient? No      Pain Assessment   Currently in Pain? No/denies                Social History   Tobacco Use  Smoking Status Never  Smokeless Tobacco Never    Goals Met:  Independence with exercise equipment Exercise tolerated well No report of concerns or symptoms today Strength training completed today  Goals Unmet:  Not Applicable  Comments: Pt able to follow exercise prescription today without complaint.  Will continue to monitor for progression.    Dr. Bethann Punches is Medical Director for [facility] Cardiac Rehabilitation.  Dr. Vida Rigger is Medical Director for [facility] Pulmonary Rehabilitation.

## 2022-11-26 ENCOUNTER — Encounter: Payer: HMO | Admitting: *Deleted

## 2022-11-26 DIAGNOSIS — Z951 Presence of aortocoronary bypass graft: Secondary | ICD-10-CM

## 2022-11-26 NOTE — Progress Notes (Signed)
Daily Session Note  Patient Details  Name: Rodney Hall. MRN: [id number] Date of Birth: 02/28/1946 Referring Provider:   Flowsheet Row Cardiac Rehab from 10/14/2022 in [facility] Cardiac and Pulmonary Rehab  Referring Provider Dr. Julien Nordmann       Encounter Date: 11/26/2022  Check In:  Session Check In - 11/26/22 1103       Check-In   Supervising physician immediately available to respond to emergencies See telemetry face sheet for immediately available ER MD    Location [facility]-Cardiac & Pulmonary Rehab    Staff Present Ronette Deter, BS, Exercise Physiologist;Meredith Jewel Baize, RN BSN;Maxon Conetta BS, , Exercise Physiologist;Jishnu Jenniges Katrinka Blazing, RN, ADN    Virtual Visit No    Medication changes reported     No    Fall or balance concerns reported    No    Warm-up and Cool-down Performed on first and last piece of equipment    Resistance Training Performed Yes    VAD Patient? No    PAD/SET Patient? No      Pain Assessment   Currently in Pain? No/denies                Social History   Tobacco Use  Smoking Status Never  Smokeless Tobacco Never    Goals Met:  Independence with exercise equipment Exercise tolerated well No report of concerns or symptoms today Strength training completed today  Goals Unmet:  Not Applicable  Comments: Pt able to follow exercise prescription today without complaint.  Will continue to monitor for progression.    Dr. Bethann Punches is Medical Director for [facility] Cardiac Rehabilitation.  Dr. Vida Rigger is Medical Director for [facility] Pulmonary Rehabilitation.

## 2022-11-28 ENCOUNTER — Ambulatory Visit (INDEPENDENT_AMBULATORY_CARE_PROVIDER_SITE_OTHER): Payer: HMO | Admitting: Urology

## 2022-11-28 ENCOUNTER — Encounter: Payer: HMO | Admitting: *Deleted

## 2022-11-28 ENCOUNTER — Encounter: Payer: Self-pay | Admitting: Urology

## 2022-11-28 VITALS — BP 108/73 | HR 101 | Ht 71.0 in | Wt 188.0 lb

## 2022-11-28 DIAGNOSIS — Z951 Presence of aortocoronary bypass graft: Secondary | ICD-10-CM | POA: Diagnosis not present

## 2022-11-28 DIAGNOSIS — N401 Enlarged prostate with lower urinary tract symptoms: Secondary | ICD-10-CM

## 2022-11-28 DIAGNOSIS — R3121 Asymptomatic microscopic hematuria: Secondary | ICD-10-CM

## 2022-11-28 DIAGNOSIS — N138 Other obstructive and reflux uropathy: Secondary | ICD-10-CM

## 2022-11-28 DIAGNOSIS — R300 Dysuria: Secondary | ICD-10-CM | POA: Diagnosis not present

## 2022-11-28 LAB — BLADDER SCAN AMB NON-IMAGING

## 2022-11-28 MED ORDER — IMIPRAMINE HCL 50 MG PO TABS: 50.0000 mg | ORAL_TABLET | Freq: Every day | ORAL | 3 refills | Status: DC

## 2022-11-28 MED ORDER — TAMSULOSIN HCL 0.4 MG PO CAPS: 0.4000 mg | ORAL_CAPSULE | Freq: Every day | ORAL | 3 refills | Status: DC

## 2022-11-28 NOTE — Progress Notes (Signed)
11/28/22 4:02 PM   Rodney Hall. 18-Mar-1945 [id number]  CC: BPH, interstitial cystitis, nocturia, PSA screening  HPI: 77 year old male with CAD, CKD transferring his care from Dr. Daleen Squibb for the above urologic issues.  He reports at least 20 or 30-year history of interstitial cystitis with primary complaint of nocturia, previously with some dysuria with certain foods but improved by avoiding those.  He has been on imipramine long-term which he feels helps, as well as Flomax.  His primary urinary complaint is nocturia 2-3 times per night, but he denies any significant urinary symptoms during the day.  He denies any dysuria or gross hematuria at this time.  He has also been followed for routine PSA screening, most recent PSA was 0.4 from March 2022.  Likely does not need further screening per the guideline recommendations.  Urinalysis today resulted after he left, and did show greater than 30 RBC but otherwise benign.  PVR today normal at 0ml.   PMH: Past Medical History:  Diagnosis Date   Abdominal mass    Abdominal pain 06/02/2017   Anxiety    Pt. denies   Arthritis    pt. 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    Surgical History: Past Surgical History:  Procedure Laterality Date   ANTERIOR LAT LUMBAR FUSION Right 07/30/2016   Procedure: Right Lateral two-three Lateral transpsoas interbody fusion with lateral plating/Minimally invasive decompression at Lumbar two-three;  Surgeon: Ditty, Loura Halt, MD;  Location: [facility] OR;  Service: Neurosurgery;  Laterality: Right;   BACK SURGERY  2012   CATARACT EXTRACTION, BILATERAL      CHOLECYSTECTOMY N/A 07/25/2017   Procedure: LAPAROSCOPIC CHOLECYSTECTOMY WITH INTRAOPERATIVE CHOLANGIOGRAM;  Surgeon: Luretha Murphy, MD;  Location: [facility] ORS;  Service: General;  Laterality: N/A;   COLONOSCOPY WITH PROPOFOL N/A 12/12/2017   Procedure: COLONOSCOPY WITH PROPOFOL;  Surgeon: Scot Jun, MD;  Location: [facility] ENDOSCOPY;  Service: Endoscopy;  Laterality: N/A;   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   Procedure: CORONARY ARTERY BYPASS GRAFTING (CABG) TIMES FIVE, USING LEFT INTERNAL MAMMARY ARTERY AND RIGHT GREATER SAPHENOUS VEIN HARVESTED ENDOSCOPICALLY;  Surgeon: [name], MD;  Location: [facility] OR;  Service: Open Heart Surgery;  Laterality: N/A;   EUS N/A 07/10/2017   Procedure: UPPER ENDOSCOPIC ULTRASOUND (EUS) LINEAR;  Surgeon: Rachael Fee, MD;  Location: [facility] ENDOSCOPY;  Service: Endoscopy;  Laterality: N/A;   EYE SURGERY     HEMI-MICRODISCECTOMY LUMBAR LAMINECTOMY LEVEL 1  09/2018   [facility]   IABP INSERTION N/A 09/06/2022   Procedure: IABP Insertion;  Surgeon: Swaziland, Peter M, MD;  Location: [facility] INVASIVE CV LAB;  Service: Cardiovascular;  Laterality: N/A;   LEFT HEART CATH AND CORONARY ANGIOGRAPHY N/A 09/04/2022   Procedure: LEFT HEART CATH AND CORONARY ANGIOGRAPHY;  Surgeon: Yvonne Kendall, MD;  Location: [facility] INVASIVE CV LAB;  Service: Cardiovascular;  Laterality: N/A;   LEFT HEART CATH AND CORONARY ANGIOGRAPHY N/A 09/06/2022   Procedure: LEFT HEART CATH AND CORONARY ANGIOGRAPHY;  Surgeon: Swaziland, Peter M, MD;  Location: [facility] INVASIVE CV LAB;  Service: Cardiovascular;  Laterality: N/A;  NECK SURGERY  2000   TEE WITHOUT CARDIOVERSION N/A 09/07/2022   Procedure: TRANSESOPHAGEAL ECHOCARDIOGRAM;  Surgeon: [name], MD;  Location: [facility] OR;  Service: Open Heart Surgery;  Laterality: N/A;   TENNIS ELBOW RELEASE/NIRSCHEL PROCEDURE     TONSILLECTOMY AND ADENOIDECTOMY     Family History: Family History  Problem Relation Age of Onset   Cancer Mother         brain   Diabetes Father    Rheumatic fever Sister    Heart disease Sister    Diabetes Brother     Social History:  reports that he has never smoked. He has never been exposed to tobacco smoke. He has never used smokeless tobacco. He reports that he does not drink alcohol and does not use drugs.  Physical Exam: BP 108/73   Pulse (!) 101   Ht 5\' 11"  (1.803 m)   Wt 188 lb (85.3 kg)   BMI 26.22 kg/m    Constitutional:  Alert and oriented, No acute distress. Cardiovascular: No clubbing, cyanosis, or edema. Respiratory: Normal respiratory effort, no increased work of breathing. GI: Abdomen is soft, nontender, nondistended, no abdominal masses   Laboratory Data: Reviewed, see HPI  Pertinent Imaging: I have personally viewed and interpreted the renal ultrasound from February 2024 showing no hydronephrosis or other abnormalities.  Assessment & Plan:   77 year old male previously followed by Dr. Evelene Croon for BPH, nocturia, and interstitial cystitis, very well-controlled on imipramine and Flomax daily.  Really no significant urinary complaints today aside from nocturia 2-3 times per night, and we reviewed behavioral strategies  We discussed common possible etiologies of microscopic hematuria including idiopathic, urolithiasis, medical renal disease, and malignancy. We discussed the new asymptomatic microscopic hematuria guidelines and risk categories of low, intermediate, and high risk that are based on age, risk factors like smoking, and degree of microscopic hematuria. We discussed work-up can range from repeat urinalysis, renal ultrasound and cystoscopy, to CT urogram and cystoscopy.  Will avoid CT with contrast with his CKD.  Recommend CT and cystoscopy for further evaluation of significant microscopic hematuria with greater than 30 RBC on UA Imipramine and Flomax refilled  Legrand Rams, MD 11/28/2022  [facility] [address], [address] [place], [place] [address] [phone]

## 2022-11-28 NOTE — Progress Notes (Signed)
Daily Session Note  Patient Details  Name: Rodney Hall. MRN: [id number] Date of Birth: Mar 14, 1945 Referring Provider:   Flowsheet Row Cardiac Rehab from 10/14/2022 in [facility] Cardiac and Pulmonary Rehab  Referring Provider Dr. Julien Nordmann       Encounter Date: 11/28/2022  Check In:  Session Check In - 11/28/22 1128       Check-In   Supervising physician immediately available to respond to emergencies See telemetry face sheet for immediately available ER MD    Location [facility]-Cardiac & Pulmonary Rehab    Staff Present Susann Givens, RN BSN;Laureen Manson Passey, BS, RRT, CPFT;Maxon Conetta BS, , Exercise Physiologist;Kayn Haymore Katrinka Blazing, RN, ADN    Virtual Visit No    Medication changes reported     No    Fall or balance concerns reported    No    Warm-up and Cool-down Performed on first and last piece of equipment    Resistance Training Performed Yes    VAD Patient? No    PAD/SET Patient? No      Pain Assessment   Currently in Pain? No/denies                Social History   Tobacco Use  Smoking Status Never  Smokeless Tobacco Never    Goals Met:  Independence with exercise equipment Exercise tolerated well No report of concerns or symptoms today Strength training completed today  Goals Unmet:  Not Applicable  Comments: Pt able to follow exercise prescription today without complaint.  Will continue to monitor for progression.    Dr. Bethann Punches is Medical Director for [facility] Cardiac Rehabilitation.  Dr. Vida Rigger is Medical Director for [facility] Pulmonary Rehabilitation.

## 2022-11-28 NOTE — Patient Instructions (Addendum)
Please obtain your records from Dr. Evelene Croon by calling [facility] Records Management at [phone]. Once you have received your records you may have them dropped off at our office or you may have them emailed to [place] at [email or web address]. Please be aware there is a $35 fee charged by [facility].   Nocturia refers to the need to wake up during the night to urinate, which can disrupt your sleep and impact your overall well-being. Fortunately, there are several strategies you can employ to help prevent or manage nocturia. It's important to consult with your healthcare provider before making any significant changes to your routine. Here are some helpful strategies to consider:  Limit Fluid Intake Before Bed: Avoid drinking large amounts of fluids in the evening, especially within a few hours of bedtime. Consume most of your daily fluid intake earlier in the day to reduce the need to urinate at night.  Monitor Your Diet: Limit your intake of caffeine and alcohol, as these substances can increase urine production and irritate the bladder.  Avoid diet, "zero calorie," and artificially sweetened drinks, especially sodas, in the afternoon or evening. Be mindful of consuming foods and drinks with high water content before bedtime, such as watermelon and herbal teas.  Time Your Medications: If you're taking medications that contribute to increased urination, consult your healthcare provider about adjusting the timing of these medications to minimize their impact during the night.  Practice Double Voiding: Before going to bed, make an effort to empty your bladder twice within a short period. This can help reduce the amount of urine left in your bladder before sleep.  Bladder Training: Gradually increase the time between bathroom visits during the day to train your bladder to hold larger volumes of urine. Over time, this can help reduce the frequency of nighttime awakenings  to urinate.  Elevate Your Legs During the Day: Elevating your legs during the day can help minimize fluid retention in your lower extremities, which might reduce nighttime urination.  Pelvic Floor Exercises: Strengthening your pelvic floor muscles through Kegel exercises can help improve bladder control and potentially reduce the urge to urinate at night.  Create a Relaxing Bedtime Routine: Stress and anxiety can exacerbate nocturia. Engage in calming activities before bed, such as reading, listening to soothing music, or practicing relaxation techniques.  Stay Active: Engage in regular physical activity, but avoid intense exercise close to bedtime, as this can increase your body's demand for fluids.  Maintain a Healthy Weight: Excess weight can compress the bladder and contribute to bladder and urinary issues. Aim to achieve and maintain a healthy weight through a balanced diet and regular exercise.  Remember that every individual is unique, and the effectiveness of these strategies may vary. It's important to work with your healthcare provider to develop a plan that suits your specific needs and addresses any underlying causes of nocturia.    Eating Plan for Interstitial Cystitis Interstitial cystitis (IC) is a long-term (chronic) condition that can cause pain and pressure near your bladder. It can also make you have to pee urgently and often. Symptoms may come and go. Certain foods may trigger your symptoms. Learning what foods bother you can help you come up with an eating plan to manage IC. What are tips for following this plan? You may want to work with an expert in healthy eating called a dietitian. They can help you make an eating plan by doing an elimination diet. This diet involves: Making a list of foods that you  think trigger your symptoms. You may also want to include foods that often cause symptoms in other people with IC. It may take a few months to find out which foods  bother you. Taking those foods out of your diet for about a month. After that month, you can try to have the foods again one at a time to see which ones cause symptoms. Reading food labels Once you know which foods trigger your symptoms, you can avoid them. But it's still a good idea to read food labels. Some foods that cause your symptoms may be ingredients in other foods. These foods may include: Soy. Worcestershire sauce. Vinegar. Alcohol. Artificial sweeteners. Monosodium glutamate. Other triggers may include: Chili peppers. Tomato products. Citrus fruits, flavors, or juices. Shopping Shopping can be hard if many foods trigger your IC. Bring a list of the foods you can't eat with you when you go to the store. You can get an app for your phone that lets you know which foods are safer and which ones you may want to avoid. You can find the app at the [employer] website: ic-network.com Meal planning Plan your meals based on the results of your elimination diet. If you haven't done the diet yet, plan meals based on IC food lists. These lists may be given to you by your health care provider or dietitian. The lists tell you which foods are least and most likely to cause symptoms. Avoid certain types of food when you go out to eat. These may include: Pizza. [place] food. [place] food. [place] food. General information Do not eat large portions. Drink lots of fluids with your meals. Do not eat foods that are high in sugar, salt, or saturated fat. Choose whole fruits instead of juice. Eat a colorful variety of vegetables. Find ways to manage stress. Get enough exercise. What foods should I eat? For people with IC, the best diet is a balanced one. This means it includes things from all the food groups. Even if you have to avoid certain foods, there are still lots of healthy choices in each group. Below are some foods that may be safest for you to eat: Fruits Bananas. Blueberries and blueberry  juice. Melons. Pears. Apples. Dates. Prunes. Raisins. Apricots. Vegetables Asparagus. Avocado. Celery. Beets. Bell peppers. Black olives. Broccoli. Brussels sprouts. Cabbage. Carrots. Cauliflower. Cucumber. Eggplant. Green beans. Potatoes. Radishes. Spinach. Squash. Turnips. Zucchini. Mushrooms. Peas. Grains Oats. Rice. Bran. Oatmeal. Whole wheat bread. Meats and other proteins Beef. Fish and other seafood. Eggs. Nuts. Peanut butter. Pork. Poultry. Lamb. Garbanzo beans. Pinto beans. Dairy Whole or low-fat milk. American, mozzarella, mild cheddar, feta, ricotta, and cream cheeses. The items listed above may not be all the foods and drinks you can have. Talk to a dietitian to learn more. What foods should I avoid? You should avoid any foods that cause symptoms. It's also a good idea to avoid foods that often cause symptoms in many people with IC. These foods include: Fruits Citrus fruits, such as lemons, limes, oranges, and grapefruit. Cranberries. Strawberries. Pineapple. Kiwi. Vegetables Chili peppers. Onions. Sauerkraut. Tomato and tomato products. Rosita Fire. Grains You don't need to avoid any type of grain unless it causes symptoms. Meats and other proteins Precooked or cured meats, such as sausages or meat loaves. Soy products. Dairy Chocolate ice cream. Processed cheese. Yogurt. Drinks Alcohol. Chocolate drinks. Coffee. Cranberry juice. Fizzy drinks. Black, green, or herbal tea. Tomato juice. Sports drinks. The items listed above may not be all the foods and drinks  you should avoid. Talk to a dietitian to learn more. This information is not intended to replace advice given to you by your health care provider. Make sure you discuss any questions you have with your health care provider. Document Revised: 05/30/2022 Document Reviewed: 05/30/2022 Elsevier Patient Education  2024 [employer].

## 2022-11-29 LAB — URINALYSIS, COMPLETE
Bilirubin, UA: NEGATIVE
Glucose, UA: NEGATIVE
Ketones, UA: NEGATIVE
Leukocytes,UA: NEGATIVE
Nitrite, UA: NEGATIVE
Protein,UA: NEGATIVE
Specific Gravity, UA: 1.015 (ref 1.005–1.030)
Urobilinogen, Ur: 0.2 mg/dL (ref 0.2–1.0)
pH, UA: 5.5 (ref 5.0–7.5)

## 2022-11-29 LAB — MICROSCOPIC EXAMINATION
Bacteria, UA: NONE SEEN
RBC, Urine: >30 /HPF — AB (ref 0–2)

## 2022-11-29 NOTE — Telephone Encounter (Signed)
CT ordered. Appt scheduled.

## 2022-12-03 DIAGNOSIS — M5416 Radiculopathy, lumbar region: Secondary | ICD-10-CM | POA: Diagnosis not present

## 2022-12-04 ENCOUNTER — Encounter: Payer: Self-pay | Admitting: *Deleted

## 2022-12-04 DIAGNOSIS — Z951 Presence of aortocoronary bypass graft: Secondary | ICD-10-CM

## 2022-12-04 NOTE — Progress Notes (Signed)
Cardiac Individual Treatment Plan  Patient Details  Name: Rodney Hall. MRN: [id number] Date of Birth: [birth year] Referring Provider:   Flowsheet Row Cardiac Rehab from 10/14/2022 in [facility] Cardiac and Pulmonary Rehab  Referring Provider Dr. Julien Nordmann       Initial Encounter Date:  Flowsheet Row Cardiac Rehab from 10/14/2022 in [facility] Cardiac and Pulmonary Rehab  Date 10/14/22       Visit Diagnosis: S/P CABG x 5  Patient's Home Medications on Admission:  Current Outpatient Medications:    acetaminophen (TYLENOL) 325 MG tablet, Take 650 mg by mouth every 6 (six) hours as needed for moderate pain or headache., Disp: , Rfl:    aspirin EC 81 MG tablet, Take 1 tablet (81 mg total) by mouth daily. Swallow whole., Disp: 30 tablet, Rfl: 12   clopidogrel (PLAVIX) 75 MG tablet, Take 1 tablet (75 mg total) by mouth daily., Disp: 30 tablet, Rfl: 3   imipramine (TOFRANIL) 50 MG tablet, Take 1 tablet (50 mg total) by mouth at bedtime., Disp: 90 tablet, Rfl: 3   tamsulosin (FLOMAX) 0.4 MG CAPS capsule, Take 1 capsule (0.4 mg total) by mouth daily after supper., Disp: 90 capsule, Rfl: 3   traMADol (ULTRAM) 50 MG tablet, Take 1 tablet (50 mg total) by mouth every 6 (six) hours as needed for moderate pain., Disp: 30 tablet, Rfl: 0  Past Medical History: Past Medical History:  Diagnosis Date   Abdominal mass    Abdominal pain 06/02/2017   Anxiety    Pt. denies   Arthritis    pt. 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343  7.363  7.377  7.322  7.357  7.428   PCO2 arterial 32 - 48 mmHg - - - - 37.9  35.8  38.9  39.3  37.8  46.0  38.3  33.7   Bicarbonate 20.0 - 28.0 mmol/L - - - - 20.8  20.3  21.1  22.4  22.2  24.2  23.8  21.5  22.3   TCO2 22 - 32 mmol/L - - - - 22  21  24  22  24  24  23  23  26  25  26  24  23  23    Acid-base deficit 0.0 - 2.0 mmol/L - - - -  5.0  5.0  4.0  3.0  3.0  2.0  2.0  4.0  2.0   O2 Saturation % - - - - 97  96  99  100  100  82  100  100  91     Details       Multiple values from one day are sorted in reverse-chronological order          Exercise Target Goals: Exercise Program Goal: Individual exercise prescription set using results from initial 6 min walk test and THRR while considering  patient's activity barriers and safety.   Exercise Prescription Goal: Initial exercise prescription builds to 30-45 minutes a day of aerobic activity, 2-3 days per week.  Home exercise guidelines will be given to patient during program as part of exercise prescription that the participant will acknowledge.   Education: Aerobic Exercise: - Group verbal and visual presentation on the components of exercise prescription. Introduces F.I.T.T principle from ACSM for exercise prescriptions.  Reviews F.I.T.T. principles of aerobic exercise including progression. Written material given at graduation. Flowsheet Row Cardiac Rehab from 11/14/2022 in [facility] Cardiac and Pulmonary Rehab  Education need identified 10/14/22  Date 10/17/22  Educator NT  Instruction Review Code  1- Verbalizes Understanding       Education: Resistance Exercise: - Group verbal and visual presentation on the components of exercise prescription. Introduces F.I.T.T principle from ACSM for exercise prescriptions  Reviews F.I.T.T. principles of resistance exercise including progression. Written material given at graduation. Flowsheet Row Cardiac Rehab from 11/14/2022 in [facility] Cardiac and Pulmonary Rehab  Education need identified 10/14/22  Date 10/24/22  Educator NT  Instruction Review Code 1- [employer] Understanding        Education: Exercise & Equipment Safety: - Individual verbal instruction and demonstration of equipment use and safety with use of the equipment. Flowsheet Row Cardiac Rehab from 11/14/2022 in [facility] Cardiac and Pulmonary Rehab  Date 10/14/22  Educator MB  Instruction Review Code 1- Verbalizes Understanding       Education: Exercise Physiology & General Exercise Guidelines: - Group verbal and written instruction with models to review the exercise physiology of the cardiovascular system and associated critical values. Provides general exercise guidelines with specific guidelines to those with heart or lung disease.  Flowsheet Row Cardiac Rehab from 11/14/2022 in [facility] Cardiac and Pulmonary Rehab  Education need identified 10/14/22       Education: Flexibility, Balance, Mind/Body Relaxation: - Group verbal and visual presentation with interactive activity on the components of exercise prescription. Introduces F.I.T.T principle from ACSM for exercise prescriptions. Reviews F.I.T.T. principles of flexibility and balance exercise training including progression. Also discusses the mind body connection.  Reviews various relaxation techniques to help reduce and manage stress (i.e. Deep breathing, progressive muscle relaxation, and visualization). Balance handout provided to take home. Written material given at graduation. Flowsheet Row Cardiac Rehab from 11/14/2022 in [facility]  Cardiac and Pulmonary Rehab  Date 10/24/22  Educator NT  Instruction Review Code 1- Verbalizes Understanding       Activity Barriers & Risk Stratification:  Activity Barriers & Cardiac Risk Stratification - 10/14/22 1615       Activity Barriers & Cardiac Risk Stratification   Activity Barriers Back Problems;Balance Concerns;History of Falls;Other (comment)    Comments Dizziness    Cardiac Risk Stratification High             6 Minute Walk:  6 Minute Walk     Row Name 10/14/22 1612  6 Minute Walk   Phase Initial     Distance 1090 feet     Walk Time 6 minutes     # of Rest Breaks 0     MPH 2.06     METS 2.24     RPE 9     Perceived Dyspnea  0     VO2 Peak 7.82     Symptoms Yes (comment)     Comments Back pain 3/10     Resting HR 83 bpm     Resting BP 110/64     Resting Oxygen Saturation  98 %     Exercise Oxygen Saturation  during 6 min walk 97 %     Max Ex. HR 104 bpm     Max Ex. BP 110/62     2 Minute Post BP 100/72              Oxygen Initial Assessment:   Oxygen Re-Evaluation:   Oxygen Discharge (Final Oxygen Re-Evaluation):   Initial Exercise Prescription:  Initial Exercise Prescription - 11/05/22 1300       Recumbant Bike   METs 3.49      NuStep   Level 3    Minutes 15    METs 2.7      REL-XR   Level 6    Minutes 15    METs 2.5      T5 Nustep   Level 2    Minutes 15    METs 2.1             Perform Capillary Blood Glucose checks as needed.  Exercise Prescription Changes:   Exercise Prescription Changes     Row Name 10/14/22 1600 10/22/22 1100 11/05/22 1300 11/19/22 1300       Response to Exercise   Blood Pressure (Admit) 110/64 104/52 96/58 98/58     Blood Pressure (Exercise) 110/62 128/64 136/56 138/62    Blood Pressure (Exit) 100/72 104/66 84/50 110/62    Heart Rate (Admit) 83 bpm 79 bpm 77 bpm 92 bpm    Heart Rate (Exercise) 104 bpm 100 bpm 108 bpm 111 bpm    Heart Rate (Exit) 90 bpm 91 bpm 88 bpm  89 bpm    Oxygen Saturation (Admit) 98 % -- -- --    Oxygen Saturation (Exercise) 97 % -- -- --    Oxygen Saturation (Exit) 97 % -- -- --    Rating of Perceived Exertion (Exercise) 9 11 13 13     Perceived Dyspnea (Exercise) 0 -- -- 0    Symptoms Back pain -- -- none    Comments Results -- -- --    Duration -- -- Continue with 30 min of aerobic exercise without signs/symptoms of physical distress. Continue with 30 min of aerobic exercise without signs/symptoms of physical distress.    Intensity -- -- THRR unchanged THRR unchanged      Progression   Progression -- -- Continue to progress workloads to maintain intensity without signs/symptoms of physical distress. Continue to progress workloads to maintain intensity without signs/symptoms of physical distress.    Average METs -- -- 2.7 3.4      Resistance Training   Training Prescription -- Yes Yes Yes    Weight -- 5lbs 5 lb 5 lb    Reps -- 10-15 10-15 10-15      Interval Training   Interval Training -- -- No No      Treadmill   MPH -- -- 1.4 --  Grade -- -- 0 --    Minutes -- -- 15 --    METs -- -- 2.07 --      Recumbant Bike   Level -- 3 10 11     RPM -- 50 -- 50    Minutes -- 15 15 15     METs -- -- -- 3.85      NuStep   Level -- 2 -- 1  T6    SPM -- 80 -- --    Minutes -- 15 -- 15    METs -- -- -- 2.4      REL-XR   Level -- -- -- 9    Minutes -- -- -- 15      T5 Nustep   Level -- 2 -- --    SPM -- 80 -- --    Minutes -- 15 -- --      Oxygen   Maintain Oxygen Saturation -- -- 88% or higher 88% or higher             Exercise Comments:   Exercise Comments     Row Name 10/15/22 1131           Exercise Comments First full day of exercise!  Patient was oriented to gym and equipment including functions, settings, policies, and procedures.  Patient's individual exercise prescription and treatment plan were reviewed.  All starting workloads were established based on the results of the 6 minute walk  test done at initial orientation visit.  The plan for exercise progression was also introduced and progression will be customized based on patient's performance and goals.                Exercise Goals and Review:   Exercise Goals     Row Name 10/14/22 1647             Exercise Goals   Increase Physical Activity Yes       Intervention Provide advice, education, support and counseling about physical activity/exercise needs.;Develop an individualized exercise prescription for aerobic and resistive training based on initial evaluation findings, risk stratification, comorbidities and participant's personal goals.       Expected Outcomes Short Term: Attend rehab on a regular basis to increase amount of physical activity.;Long Term: Exercising regularly at least 3-5 days a week.;Long Term: Add in home exercise to make exercise part of routine and to increase amount of physical activity.       Increase Strength and Stamina Yes       Intervention Provide advice, education, support and counseling about physical activity/exercise needs.;Develop an individualized exercise prescription for aerobic and resistive training based on initial evaluation findings, risk stratification, comorbidities and participant's personal goals.       Expected Outcomes Short Term: Increase workloads from initial exercise prescription for resistance, speed, and METs.;Short Term: Perform resistance training exercises routinely during rehab and add in resistance training at home;Long Term: Improve cardiorespiratory fitness, muscular endurance and strength as measured by increased METs and functional capacity ( )       Able to understand and use rate of perceived exertion (RPE) scale Yes       Intervention Provide education and explanation on how to use RPE scale       Expected Outcomes Short Term: Able to use RPE daily in rehab to express subjective intensity level;Long Term:  Able to use RPE to guide intensity level when  exercising independently       Able to understand and use Dyspnea  scale Yes       Intervention Provide education and explanation on how to use Dyspnea scale       Expected Outcomes Short Term: Able to use Dyspnea scale daily in rehab to express subjective sense of shortness of breath during exertion;Long Term: Able to use Dyspnea scale to guide intensity level when exercising independently       Knowledge and understanding of Target Heart Rate Range (THRR) Yes       Intervention Provide education and explanation of THRR including how the numbers were predicted and where they are located for reference       Expected Outcomes Short Term: Able to state/look up THRR;Long Term: Able to use THRR to govern intensity when exercising independently;Short Term: Able to use daily as guideline for intensity in rehab       Able to check pulse independently Yes       Intervention Provide education and demonstration on how to check pulse in carotid and radial arteries.;Review the importance of being able to check your own pulse for safety during independent exercise       Expected Outcomes Short Term: Able to explain why pulse checking is important during independent exercise;Long Term: Able to check pulse independently and accurately       Understanding of Exercise Prescription Yes       Intervention Provide education, explanation, and written materials on patient's individual exercise prescription       Expected Outcomes Short Term: Able to explain program exercise prescription;Long Term: Able to explain home exercise prescription to exercise independently                Exercise Goals Re-Evaluation :  Exercise Goals Re-Evaluation     Row Name 10/15/22 1131 10/22/22 1132 11/05/22 1346 11/19/22 1346       Exercise Goal Re-Evaluation   Exercise Goals Review Able to understand and use rate of perceived exertion (RPE) scale;Able to understand and use Dyspnea scale;Understanding of Exercise Prescription  Increase Physical Activity;Understanding of Exercise Prescription;Increase Strength and Stamina Increase Physical Activity;Understanding of Exercise Prescription;Increase Strength and Stamina Increase Physical Activity;Understanding of Exercise Prescription;Increase Strength and Stamina    Comments Reviewed RPE  and dyspnea scale, THR and program prescription with pt today.  Pt voiced understanding and was given a copy of goals to take home. Milbert is off to a good start in the program. He has increased the bike workload. We will continue to monitor his progress in the program Elijah Birk is doing well in rehab. He began walking the treadmill at a speed of 1.4 mph with no incline and did well. He also improved to level 10 on the recumbent bike, level 3 on the T4 nustep, and level 6 on the XR. We will continue to monitor his progress in the program. Elijah Birk is doing well in rehab. He increased his XR level from 6 to 9, and he also increased his level on the recumbent bike from 10 to 11. We will continue to monitor his progress in the program.    Expected Outcomes Short: Use RPE daily to regulate intensity. Long: Follow program prescription in THR. Short: Continue to follow current exercise prescription and progressivley increase bike workload. Long: Continue exercise to improve strength and stamina. Short: Continue to progressively increase treadmill workload. Long: Continue exercise to improve strength and stamina. Short: Continue to follow current exercise prescription, and progressively increase workloads. Long: Continue exercise to improve strength and stamina.  Discharge Exercise Prescription (Final Exercise Prescription Changes):  Exercise Prescription Changes - 11/19/22 1300       Response to Exercise   Blood Pressure (Admit) 98/58    Blood Pressure (Exercise) 138/62    Blood Pressure (Exit) 110/62    Heart Rate (Admit) 92 bpm    Heart Rate (Exercise) 111 bpm    Heart Rate (Exit) 89 bpm     Rating of Perceived Exertion (Exercise) 13    Perceived Dyspnea (Exercise) 0    Symptoms none    Duration Continue with 30 min of aerobic exercise without signs/symptoms of physical distress.    Intensity THRR unchanged      Progression   Progression Continue to progress workloads to maintain intensity without signs/symptoms of physical distress.    Average METs 3.4      Resistance Training   Training Prescription Yes    Weight 5 lb    Reps 10-15      Interval Training   Interval Training No      Recumbant Bike   Level 11    RPM 50    Minutes 15    METs 3.85      NuStep   Level 1   T6   Minutes 15    METs 2.4      REL-XR   Level 9    Minutes 15      Oxygen   Maintain Oxygen Saturation 88% or higher             Nutrition:  Target Goals: Understanding of nutrition guidelines, daily intake of sodium 1500mg , cholesterol 200mg , calories 30% from fat and 7% or less from saturated fats, daily to have 5 or more servings of fruits and vegetables.  Education: All About Nutrition: -Group instruction provided by verbal, written material, interactive activities, discussions, models, and posters to present general guidelines for heart healthy nutrition including fat, fiber, MyPlate, the role of sodium in heart healthy nutrition, utilization of the nutrition label, and utilization of this knowledge for meal planning. Follow up email sent as well. Written material given at graduation. Flowsheet Row Cardiac Rehab from 11/14/2022 in [facility] Cardiac and Pulmonary Rehab  Education need identified 10/14/22       Biometrics:  Pre Biometrics - 10/14/22 1647       Pre Biometrics   Height 5' 11.5" (1.816 m)    Weight 184 lb 14.4 oz (83.9 kg)    Waist Circumference 40.5 inches    Hip Circumference 40.5 inches    Waist to Hip Ratio 1 %    BMI (Calculated) 25.43    Single Leg Stand 2 seconds              Nutrition Therapy Plan and Nutrition Goals:  Nutrition Therapy &  Goals - 10/14/22 1650       Nutrition Therapy   RD appointment deferred Yes      Intervention Plan   Intervention Prescribe, educate and counsel regarding individualized specific dietary modifications aiming towards targeted core components such as weight, hypertension, lipid management, diabetes, heart failure and other comorbidities.    Expected Outcomes Short Term Goal: A plan has been developed with personal nutrition goals set during dietitian appointment.;Long Term Goal: Adherence to prescribed nutrition plan.;Short Term Goal: Understand basic principles of dietary content, such as calories, fat, sodium, cholesterol and nutrients.             Nutrition Assessments:  MEDIFICTS Score Key: >=70 Need to make dietary changes  40-70 Heart Healthy Diet <= 40 Therapeutic Level Cholesterol Diet  Flowsheet Row Cardiac Rehab from 10/14/2022 in [facility] Cardiac and Pulmonary Rehab  Picture Your Plate Total Score on Admission 76      Picture Your Plate Scores: <78 Unhealthy dietary pattern with much room for improvement. 41-50 Dietary pattern unlikely to meet recommendations for good health and room for improvement. 51-60 More healthful dietary pattern, with some room for improvement.  >60 Healthy dietary pattern, although there may be some specific behaviors that could be improved.    Nutrition Goals Re-Evaluation:   Nutrition Goals Discharge (Final Nutrition Goals Re-Evaluation):   Psychosocial: Target Goals: Acknowledge presence or absence of significant depression and/or stress, maximize coping skills, provide positive support system. Participant is able to verbalize types and ability to use techniques and skills needed for reducing stress and depression.   Education: Stress, Anxiety, and Depression - Group verbal and visual presentation to define topics covered.  Reviews how body is impacted by stress, anxiety, and depression.  Also discusses healthy ways to reduce stress and to  treat/manage anxiety and depression.  Written material given at graduation.   Education: Sleep Hygiene -Provides group verbal and written instruction about how sleep can affect your health.  Define sleep hygiene, discuss sleep cycles and impact of sleep habits. Review good sleep hygiene tips.    Initial Review & Psychosocial Screening:  Initial Psych Review & Screening - 10/10/22 1111       Initial Review   Current issues with None Identified      Family Dynamics   Good Support System? Yes    Comments He can look to his wife for support. Now that his health is back under control he does not have any stressors or deprression.      Barriers   Psychosocial barriers to participate in program The patient should benefit from training in stress management and relaxation.;There are no identifiable barriers or psychosocial needs.      Screening Interventions   Interventions Encouraged to exercise;To provide support and resources with identified psychosocial needs;Provide feedback about the scores to participant    Expected Outcomes Short Term goal: Utilizing psychosocial counselor, staff and physician to assist with identification of specific Stressors or current issues interfering with healing process. Setting desired goal for each stressor or current issue identified.;Long Term Goal: Stressors or current issues are controlled or eliminated.;Short Term goal: Identification and review with participant of any Quality of Life or Depression concerns found by scoring the questionnaire.;Long Term goal: The participant improves quality of Life and PHQ9 Scores as seen by post scores and/or verbalization of changes             Quality of Life Scores:   Quality of Life - 10/14/22 1652       Quality of Life   Select Quality of Life      Quality of Life Scores   Health/Function Pre 19.12 %    Socioeconomic Pre 27.92 %    Psych/Spiritual Pre 28.07 %    Family Pre 30 %    GLOBAL Pre 24.6 %             Scores of 19 and below usually indicate a poorer quality of life in these areas.  A difference of  2-3 points is a clinically meaningful difference.  A difference of 2-3 points in the total score of the Quality of Life Index has been associated with significant improvement in overall quality of life, self-image, physical symptoms,  and general health in studies assessing change in quality of life.  PHQ-9: Review Flowsheet  More data exists      10/14/2022 10/11/2022 07/03/2022 10/10/2021 10/09/2021  Depression screen PHQ 2/9  Decreased Interest 0 0 0 0 0 0  Down, Depressed, Hopeless 0 0 0 0 0 0  PHQ - 2 Score 0 0 0 0 0 0  Altered sleeping 1 0 - - 0  Tired, decreased energy 0 0 - - 0  Change in appetite 0 0 - - 0  Feeling bad or failure about yourself  0 0 - - 0  Trouble concentrating 0 1 - - 0  Moving slowly or fidgety/restless 0 0 - - 0  Suicidal thoughts 0 0 - - 0  PHQ-9 Score 1 1 - - 0  Difficult doing work/chores - Not difficult at all - - Not difficult at all    Details       Multiple values from one day are sorted in reverse-chronological order        Interpretation of Total Score  Total Score Depression Severity:  1-4 = Minimal depression, 5-9 = Mild depression, 10-14 = Moderate depression, 15-19 = Moderately severe depression, 20-27 = Severe depression   Psychosocial Evaluation and Intervention:  Psychosocial Evaluation - 10/10/22 1112       Psychosocial Evaluation & Interventions   Interventions Encouraged to exercise with the program and follow exercise prescription;Relaxation education;Stress management education    Comments He can look to his wife for support. Now that his health is back under control he does not have any stressors or deprression.    Expected Outcomes Short: Start [facility] to help with mood. Long: Maintain a healthy mental state    Continue Psychosocial Services  Follow up required by staff             Psychosocial  Re-Evaluation:   Psychosocial Discharge (Final Psychosocial Re-Evaluation):   Vocational Rehabilitation: Provide vocational rehab assistance to qualifying candidates.   Vocational Rehab Evaluation & Intervention:   Education: Education Goals: Education classes will be provided on a variety of topics geared toward better understanding of heart health and risk factor modification. Participant will state understanding/return demonstration of topics presented as noted by education test scores.  Learning Barriers/Preferences:  Learning Barriers/Preferences - 10/10/22 1110       Learning Barriers/Preferences   Learning Barriers None    Learning Preferences None             General Cardiac Education Topics:  AED/CPR: - Group verbal and written instruction with the use of models to demonstrate the basic use of the AED with the basic ABC's of resuscitation.   Anatomy and Cardiac Procedures: - Group verbal and visual presentation and models provide information about basic cardiac anatomy and function. Reviews the testing methods done to diagnose heart disease and the outcomes of the test results. Describes the treatment choices: Medical Management, Angioplasty, or Coronary Bypass Surgery for treating various heart conditions including Myocardial Infarction, Angina, Valve Disease, and Cardiac Arrhythmias.  Written material given at graduation. Flowsheet Row Cardiac Rehab from 11/14/2022 in [facility] Cardiac and Pulmonary Rehab  Education need identified 10/14/22  Date 11/14/22  Educator SB  Instruction Review Code 1- Verbalizes Understanding       Medication Safety: - Group verbal and visual instruction to review commonly prescribed medications for heart and lung disease. Reviews the medication, class of the drug, and side effects. Includes the steps to properly store meds and maintain the  prescription regimen.  Written material given at graduation.   Intimacy: - Group verbal  instruction through game format to discuss how heart and lung disease can affect sexual intimacy. Written material given at graduation.. Flowsheet Row Cardiac Rehab from 11/14/2022 in [facility] Cardiac and Pulmonary Rehab  Date 10/17/22  Educator NT  Instruction Review Code 1- Verbalizes Understanding       Know Your Numbers and Heart Failure: - Group verbal and visual instruction to discuss disease risk factors for cardiac and pulmonary disease and treatment options.  Reviews associated critical values for Overweight/Obesity, Hypertension, Cholesterol, and Diabetes.  Discusses basics of heart failure: signs/symptoms and treatments.  Introduces Heart Failure Zone chart for action plan for heart failure.  Written material given at graduation. Flowsheet Row Cardiac Rehab from 11/14/2022 in [facility] Cardiac and Pulmonary Rehab  Education need identified 10/14/22       Infection Prevention: - Provides verbal and written material to individual with discussion of infection control including proper hand washing and proper equipment cleaning during exercise session. Flowsheet Row Cardiac Rehab from 11/14/2022 in [facility] Cardiac and Pulmonary Rehab  Date 10/14/22  Educator MB  Instruction Review Code 1- Verbalizes Understanding       Falls Prevention: - Provides verbal and written material to individual with discussion of falls prevention and safety. Flowsheet Row Cardiac Rehab from 11/14/2022 in [facility] Cardiac and Pulmonary Rehab  Date 10/14/22  Educator MB  Instruction Review Code 1- Verbalizes Understanding       Other: -Provides group and verbal instruction on various topics (see comments)   Knowledge Questionnaire Score:  Knowledge Questionnaire Score - 10/14/22 1654       Knowledge Questionnaire Score   Pre Score 20/26             Core Components/Risk Factors/Patient Goals at Admission:  Personal Goals and Risk Factors at Admission - 10/14/22 1658       Core Components/Risk  Factors/Patient Goals on Admission    Weight Management Yes;Weight Maintenance    Intervention Weight Management: Develop a combined nutrition and exercise program designed to reach desired caloric intake, while maintaining appropriate intake of nutrient and fiber, sodium and fats, and appropriate energy expenditure required for the weight goal.;Weight Management: Provide education and appropriate resources to help participant work on and attain dietary goals.;Weight Management/Obesity: Establish reasonable short term and long term weight goals.    Admit Weight 184 lb 14.4 oz (83.9 kg)    Goal Weight: Short Term 184 lb (83.5 kg)    Goal Weight: Long Term 184 lb (83.5 kg)    Expected Outcomes Short Term: Continue to assess and modify interventions until short term weight is achieved;Long Term: Adherence to nutrition and physical activity/exercise program aimed toward attainment of established weight goal;Weight Maintenance: Understanding of the daily nutrition guidelines, which includes 25-35% calories from fat, 7% or less cal from saturated fats, less than 200mg  cholesterol, less than 1.5gm of sodium, & 5 or more servings of fruits and vegetables daily;Understanding recommendations for meals to include 15-35% energy as protein, 25-35% energy from fat, 35-60% energy from carbohydrates, less than 200mg  of dietary cholesterol, 20-35 gm of total fiber daily;Understanding of distribution of calorie intake throughout the day with the consumption of 4-5 meals/snacks    Heart Failure Yes    Intervention Provide a combined exercise and nutrition program that is supplemented with education, support and counseling about heart failure. Directed toward relieving symptoms such as shortness of breath, decreased exercise tolerance, and extremity edema.  Expected Outcomes Improve functional capacity of life;Short term: Attendance in program 2-3 days a week with increased exercise capacity. Reported lower sodium intake.  Reported increased fruit and vegetable intake. Reports medication compliance.;Short term: Daily weights obtained and reported for increase. Utilizing diuretic protocols set by physician.;Long term: Adoption of self-care skills and reduction of barriers for early signs and symptoms recognition and intervention leading to self-care maintenance.    Lipids Yes    Intervention Provide education and support for participant on nutrition & aerobic/resistive exercise along with prescribed medications to achieve LDL 70mg , HDL >40mg .    Expected Outcomes Short Term: Participant states understanding of desired cholesterol values and is compliant with medications prescribed. Participant is following exercise prescription and nutrition guidelines.;Long Term: Cholesterol controlled with medications as prescribed, with individualized exercise RX and with personalized nutrition plan. Value goals: LDL < 70mg , HDL > 40 mg.             Education:Diabetes - Individual verbal and written instruction to review signs/symptoms of diabetes, desired ranges of glucose level fasting, after meals and with exercise. Acknowledge that pre and post exercise glucose checks will be done for 3 sessions at entry of program.   Core Components/Risk Factors/Patient Goals Review:    Core Components/Risk Factors/Patient Goals at Discharge (Final Review):    ITP Comments:  ITP Comments     Row Name 10/10/22 1110 10/14/22 1611 10/14/22 1705 10/15/22 1131 11/06/22 0913   ITP Comments Virtual Visit completed. Patient informed on EP and RD appointment and 6 Minute walk test. Patient also informed of patient health questionnaires on My Chart. Patient Verbalizes understanding. Visit diagnosis can be found in [facility] 09/10/2022. Completed and gym orientation. Initial ITP created and sent for review to Dr. Bethann Punches, Medical Director. Completed and gym orientation. Initial ITP created and sent for review to Dr. Bethann Punches, Medical  Director. First full day of exercise!  Patient was oriented to gym and equipment including functions, settings, policies, and procedures.  Patient's individual exercise prescription and treatment plan were reviewed.  All starting workloads were established based on the results of the 6 minute walk test done at initial orientation visit.  The plan for exercise progression was also introduced and progression will be customized based on patient's performance and goals. 30 Day review completed. Medical Director ITP review done, changes made as directed, and signed approval by Medical Director.     new to program    Row Name 12/04/22 0944           ITP Comments 30 Day review completed. Medical Director ITP review done, changes made as directed, and signed approval by Medical Director.                Comments:

## 2022-12-04 NOTE — Progress Notes (Signed)
30 Day review completed. Medical Director ITP review done, changes made as directed, and signed approval by Medical Director.  

## 2022-12-05 ENCOUNTER — Other Ambulatory Visit: Payer: Self-pay

## 2022-12-05 ENCOUNTER — Other Ambulatory Visit: Payer: HMO

## 2022-12-05 ENCOUNTER — Encounter: Payer: HMO | Attending: Cardiovascular Disease | Admitting: *Deleted

## 2022-12-05 DIAGNOSIS — N138 Other obstructive and reflux uropathy: Secondary | ICD-10-CM

## 2022-12-05 DIAGNOSIS — Z48812 Encounter for surgical aftercare following surgery on the circulatory system: Secondary | ICD-10-CM | POA: Insufficient documentation

## 2022-12-05 DIAGNOSIS — Z951 Presence of aortocoronary bypass graft: Secondary | ICD-10-CM | POA: Insufficient documentation

## 2022-12-05 DIAGNOSIS — N401 Enlarged prostate with lower urinary tract symptoms: Secondary | ICD-10-CM | POA: Diagnosis not present

## 2022-12-05 NOTE — Progress Notes (Signed)
Daily Session Note  Patient Details  Name: Rodney Hall. MRN: [id number] Date of Birth: 05-22-1945 Referring Provider:   Flowsheet Row Cardiac Rehab from 10/14/2022 in [facility] Cardiac and Pulmonary Rehab  Referring Provider Dr. Julien Nordmann       Encounter Date: 12/05/2022  Check In:  Session Check In - 12/05/22 1113       Check-In   Supervising physician immediately available to respond to emergencies See telemetry face sheet for immediately available ER MD    Location [facility]-Cardiac & Pulmonary Rehab    Staff Present Ronette Deter, BS, Exercise Physiologist;Meredith Jewel Baize, RN BSN;Joseph Shelbie [place]ctor, RN, [place]    Virtual Visit No    Medication changes reported     No    Fall or balance concerns reported    No    Warm-up and Cool-down Performed on first and last piece of equipment    Resistance Training Performed Yes    VAD Patient? No    PAD/SET Patient? No      Pain Assessment   Currently in Pain? No/denies                Social History   Tobacco Use  Smoking Status Never   Passive exposure: Never  Smokeless Tobacco Never    Goals Met:  Independence with exercise equipment Exercise tolerated well No report of concerns or symptoms today Strength training completed today  Goals Unmet:  Not Applicable  Comments: Pt able to follow exercise prescription today without complaint.  Will continue to monitor for progression.    Dr. Bethann Punches is Medical Director for [facility] Cardiac Rehabilitation.  Dr. Vida Rigger is Medical Director for [facility] Pulmonary Rehabilitation.

## 2022-12-06 LAB — PSA: Prostate Specific Ag, Serum: 0.6 ng/mL (ref 0.0–4.0)

## 2022-12-09 ENCOUNTER — Ambulatory Visit
Admission: RE | Admit: 2022-12-09 | Discharge: 2022-12-09 | Disposition: A | Discharge status: Home / Self Care | Payer: HMO | Source: Ambulatory Visit | Attending: Urology | Admitting: Urology

## 2022-12-09 DIAGNOSIS — N289 Disorder of kidney and ureter, unspecified: Secondary | ICD-10-CM | POA: Diagnosis not present

## 2022-12-09 DIAGNOSIS — R3121 Asymptomatic microscopic hematuria: Secondary | ICD-10-CM | POA: Diagnosis present

## 2022-12-09 DIAGNOSIS — N200 Calculus of kidney: Secondary | ICD-10-CM | POA: Diagnosis not present

## 2022-12-09 DIAGNOSIS — K5750 Diverticulosis of both small and large intestine without perforation or abscess without bleeding: Secondary | ICD-10-CM | POA: Diagnosis not present

## 2022-12-10 ENCOUNTER — Encounter: Payer: HMO | Admitting: *Deleted

## 2022-12-10 DIAGNOSIS — Z951 Presence of aortocoronary bypass graft: Secondary | ICD-10-CM

## 2022-12-10 DIAGNOSIS — Z48812 Encounter for surgical aftercare following surgery on the circulatory system: Secondary | ICD-10-CM | POA: Diagnosis not present

## 2022-12-10 NOTE — Progress Notes (Signed)
Daily Session Note  Patient Details  Name: Rodney Hall. MRN: [id number] Date of Birth: 1945-09-21 Referring Provider:   Flowsheet Row Cardiac Rehab from 10/14/2022 in [facility] Cardiac and Pulmonary Rehab  Referring Provider Dr. Julien Nordmann       Encounter Date: 12/10/2022  Check In:  Session Check In - 12/10/22 1120       Check-In   Supervising physician immediately available to respond to emergencies See telemetry face sheet for immediately available ER MD    Location [facility]-Cardiac & Pulmonary Rehab    Staff Present Cora Collum, RN, BSN, CCRP;Maxon Conetta BS, , Exercise Physiologist;Jason Wallace Cullens, RDN, LDN;Meredith Jewel Baize, RN BSN    Virtual Visit No    Medication changes reported     No    Fall or balance concerns reported    No    Warm-up and Cool-down Performed on first and last piece of equipment    Resistance Training Performed Yes    VAD Patient? No    PAD/SET Patient? No      Pain Assessment   Currently in Pain? No/denies                Social History   Tobacco Use  Smoking Status Never   Passive exposure: Never  Smokeless Tobacco Never    Goals Met:  Independence with exercise equipment Exercise tolerated well No report of concerns or symptoms today  Goals Unmet:  Not Applicable  Comments: Pt able to follow exercise prescription today without complaint.  Will continue to monitor for progression.    Dr. Bethann Punches is Medical Director for [facility] Cardiac Rehabilitation.  Dr. Vida Rigger is Medical Director for [facility] Pulmonary Rehabilitation.

## 2022-12-12 ENCOUNTER — Encounter: Payer: HMO | Admitting: *Deleted

## 2022-12-12 ENCOUNTER — Other Ambulatory Visit: Payer: HMO | Admitting: Urology

## 2022-12-12 DIAGNOSIS — Z48812 Encounter for surgical aftercare following surgery on the circulatory system: Secondary | ICD-10-CM | POA: Diagnosis not present

## 2022-12-12 DIAGNOSIS — Z951 Presence of aortocoronary bypass graft: Secondary | ICD-10-CM

## 2022-12-12 NOTE — Progress Notes (Signed)
Daily Session Note  Patient Details  Name: Rodney Hall. MRN: [id number] Date of Birth: Oct 09, 1945 Referring Provider:   Flowsheet Row Cardiac Rehab from 10/14/2022 in [facility] Cardiac and Pulmonary Rehab  Referring Provider Dr. Julien Nordmann       Encounter Date: 12/12/2022  Check In:  Session Check In - 12/12/22 1104       Check-In   Supervising physician immediately available to respond to emergencies See telemetry face sheet for immediately available ER MD    Location [facility]-Cardiac & Pulmonary Rehab    Staff Present Ronette Deter, BS, Exercise Physiologist;Meredith Jewel Baize, RN BSN;Joseph Shelbie [place]ctor, RN, [place]    Virtual Visit No    Medication changes reported     No    Fall or balance concerns reported    No    Warm-up and Cool-down Performed on first and last piece of equipment    Resistance Training Performed Yes    VAD Patient? No    PAD/SET Patient? No      Pain Assessment   Currently in Pain? No/denies                Social History   Tobacco Use  Smoking Status Never   Passive exposure: Never  Smokeless Tobacco Never    Goals Met:  Independence with exercise equipment Exercise tolerated well No report of concerns or symptoms today Strength training completed today  Goals Unmet:  Not Applicable  Comments: Pt able to follow exercise prescription today without complaint.  Will continue to monitor for progression.    Dr. Bethann Punches is Medical Director for [facility] Cardiac Rehabilitation.  Dr. Vida Rigger is Medical Director for [facility] Pulmonary Rehabilitation.

## 2022-12-17 ENCOUNTER — Encounter: Payer: HMO | Admitting: *Deleted

## 2022-12-17 DIAGNOSIS — Z951 Presence of aortocoronary bypass graft: Secondary | ICD-10-CM

## 2022-12-17 DIAGNOSIS — Z48812 Encounter for surgical aftercare following surgery on the circulatory system: Secondary | ICD-10-CM | POA: Diagnosis not present

## 2022-12-17 NOTE — Progress Notes (Signed)
Daily Session Note  Patient Details  Name: Rodney Hall. MRN: [id number] Date of Birth: Oct 15, 1945 Referring Provider:   Flowsheet Row Cardiac Rehab from 10/14/2022 in [facility] Cardiac and Pulmonary Rehab  Referring Provider Dr. Julien Nordmann       Encounter Date: 12/17/2022  Check In:  Session Check In - 12/17/22 1126       Check-In   Supervising physician immediately available to respond to emergencies See telemetry face sheet for immediately available ER MD    Location [facility]-Cardiac & Pulmonary Rehab    Staff Present Maxon Suzzette Righter, , Exercise Physiologist;Meredith Jewel Baize, RN BSN;Jason Wallace Cullens, RDN, LDN;Hamzeh Tall, RN, BSN, CCRP    Virtual Visit No    Medication changes reported     No    Fall or balance concerns reported    No    Warm-up and Cool-down Performed on first and last piece of equipment    Resistance Training Performed Yes    VAD Patient? No    PAD/SET Patient? No      Pain Assessment   Currently in Pain? No/denies                Social History   Tobacco Use  Smoking Status Never   Passive exposure: Never  Smokeless Tobacco Never    Goals Met:  Independence with exercise equipment Exercise tolerated well No report of concerns or symptoms today  Goals Unmet:  Not Applicable  Comments: Pt able to follow exercise prescription today without complaint.  Will continue to monitor for progression.    Dr. Bethann Punches is Medical Director for [facility] Cardiac Rehabilitation.  Dr. Vida Rigger is Medical Director for [facility] Pulmonary Rehabilitation.

## 2022-12-18 DIAGNOSIS — N1832 Chronic kidney disease, stage 3b: Secondary | ICD-10-CM | POA: Diagnosis not present

## 2022-12-18 DIAGNOSIS — I10 Essential (primary) hypertension: Secondary | ICD-10-CM | POA: Diagnosis not present

## 2022-12-18 DIAGNOSIS — R7303 Prediabetes: Secondary | ICD-10-CM | POA: Diagnosis not present

## 2022-12-18 DIAGNOSIS — E781 Pure hyperglyceridemia: Secondary | ICD-10-CM | POA: Diagnosis not present

## 2022-12-18 DIAGNOSIS — N400 Enlarged prostate without lower urinary tract symptoms: Secondary | ICD-10-CM | POA: Diagnosis not present

## 2022-12-19 ENCOUNTER — Encounter: Payer: HMO | Admitting: *Deleted

## 2022-12-19 DIAGNOSIS — M961 Postlaminectomy syndrome, not elsewhere classified: Secondary | ICD-10-CM | POA: Insufficient documentation

## 2022-12-19 DIAGNOSIS — Z48812 Encounter for surgical aftercare following surgery on the circulatory system: Secondary | ICD-10-CM | POA: Diagnosis not present

## 2022-12-19 DIAGNOSIS — Z951 Presence of aortocoronary bypass graft: Secondary | ICD-10-CM

## 2022-12-19 DIAGNOSIS — M5416 Radiculopathy, lumbar region: Secondary | ICD-10-CM | POA: Diagnosis not present

## 2022-12-19 NOTE — Progress Notes (Signed)
Daily Session Note  Patient Details  Name: Rodney Hall. MRN: [id number] Date of Birth: 06/13/1945 Referring Provider:   Flowsheet Row Cardiac Rehab from 10/14/2022 in [facility] Cardiac and Pulmonary Rehab  Referring Provider Dr. Julien Nordmann       Encounter Date: 12/19/2022  Check In:  Session Check In - 12/19/22 1105       Check-In   Supervising physician immediately available to respond to emergencies See telemetry face sheet for immediately available ER MD    Location [facility]-Cardiac & Pulmonary Rehab    Staff Present Susann Givens, RN BSN;Joseph Hood, RCP,RRT,BSRT;Maxon Langley BS, , Exercise Physiologist;Savita Runner Katrinka Blazing, RN, [place]    Virtual Visit No    Medication changes reported     No    Fall or balance concerns reported    No    Warm-up and Cool-down Performed on first and last piece of equipment    Resistance Training Performed Yes    VAD Patient? No    PAD/SET Patient? No      Pain Assessment   Currently in Pain? No/denies                Social History   Tobacco Use  Smoking Status Never   Passive exposure: Never  Smokeless Tobacco Never    Goals Met:  Independence with exercise equipment Exercise tolerated well No report of concerns or symptoms today Strength training completed today  Goals Unmet:  Not Applicable  Comments: Pt able to follow exercise prescription today without complaint.  Will continue to monitor for progression.    Dr. Bethann Punches is Medical Director for [facility] Cardiac Rehabilitation.  Dr. Vida Rigger is Medical Director for [facility] Pulmonary Rehabilitation.

## 2022-12-24 ENCOUNTER — Encounter: Payer: HMO | Admitting: *Deleted

## 2022-12-24 DIAGNOSIS — Z48812 Encounter for surgical aftercare following surgery on the circulatory system: Secondary | ICD-10-CM | POA: Diagnosis not present

## 2022-12-24 DIAGNOSIS — Z951 Presence of aortocoronary bypass graft: Secondary | ICD-10-CM

## 2022-12-24 NOTE — Progress Notes (Signed)
Daily Session Note  Patient Details  Name: Rodney Hall. MRN: [id number] Date of Birth: 11-24-1945 Referring Provider:   Flowsheet Row Cardiac Rehab from 10/14/2022 in [facility] Cardiac and Pulmonary Rehab  Referring Provider Dr. Julien Nordmann       Encounter Date: 12/24/2022  Check In:  Session Check In - 12/24/22 1125       Check-In   Supervising physician immediately available to respond to emergencies See telemetry face sheet for immediately available ER MD    Location [facility]-Cardiac & Pulmonary Rehab    Staff Present Cora Collum, RN, BSN, CCRP;Noah Tickle, BS, Exercise Physiologist;Maxon Conetta BS, , Exercise Physiologist;Jason Wallace Cullens, RDN, LDN    Virtual Visit No    Medication changes reported     No    Fall or balance concerns reported    No    Warm-up and Cool-down Performed on first and last piece of equipment    Resistance Training Performed Yes    VAD Patient? No    PAD/SET Patient? No      Pain Assessment   Currently in Pain? No/denies                Social History   Tobacco Use  Smoking Status Never   Passive exposure: Never  Smokeless Tobacco Never    Goals Met:  Independence with exercise equipment Exercise tolerated well No report of concerns or symptoms today  Goals Unmet:  Not Applicable  Comments: Pt able to follow exercise prescription today without complaint.  Will continue to monitor for progression.    Dr. Bethann Punches is Medical Director for [facility] Cardiac Rehabilitation.  Dr. Vida Rigger is Medical Director for [facility] Pulmonary Rehabilitation.

## 2022-12-26 ENCOUNTER — Encounter: Payer: HMO | Admitting: *Deleted

## 2022-12-26 DIAGNOSIS — N281 Cyst of kidney, acquired: Secondary | ICD-10-CM | POA: Diagnosis not present

## 2022-12-26 DIAGNOSIS — R8290 Unspecified abnormal findings in urine: Secondary | ICD-10-CM | POA: Diagnosis not present

## 2022-12-26 DIAGNOSIS — Z951 Presence of aortocoronary bypass graft: Secondary | ICD-10-CM

## 2022-12-26 DIAGNOSIS — N1832 Chronic kidney disease, stage 3b: Secondary | ICD-10-CM | POA: Diagnosis not present

## 2022-12-26 DIAGNOSIS — R7303 Prediabetes: Secondary | ICD-10-CM | POA: Diagnosis not present

## 2022-12-26 DIAGNOSIS — Z48812 Encounter for surgical aftercare following surgery on the circulatory system: Secondary | ICD-10-CM | POA: Diagnosis not present

## 2022-12-26 DIAGNOSIS — E1122 Type 2 diabetes mellitus with diabetic chronic kidney disease: Secondary | ICD-10-CM | POA: Diagnosis not present

## 2022-12-26 DIAGNOSIS — I10 Essential (primary) hypertension: Secondary | ICD-10-CM | POA: Diagnosis not present

## 2022-12-26 DIAGNOSIS — E781 Pure hyperglyceridemia: Secondary | ICD-10-CM | POA: Diagnosis not present

## 2022-12-26 DIAGNOSIS — N400 Enlarged prostate without lower urinary tract symptoms: Secondary | ICD-10-CM | POA: Diagnosis not present

## 2022-12-26 NOTE — Progress Notes (Signed)
Daily Session Note  Patient Details  Name: Rodney Hall. MRN: [id number] Date of Birth: 05/24/1945 Referring Provider:   Flowsheet Row Cardiac Rehab from 10/14/2022 in [facility] Cardiac and Pulmonary Rehab  Referring Provider Dr. Julien Nordmann       Encounter Date: 12/26/2022  Check In:  Session Check In - 12/26/22 1059       Check-In   Supervising physician immediately available to respond to emergencies See telemetry face sheet for immediately available ER MD    Location [facility]-Cardiac & Pulmonary Rehab    Staff Present Susann Givens, RN BSN;Joseph Reino Kent, Guinevere Ferrari, RN, [place]    Virtual Visit No    Medication changes reported     No    Fall or balance concerns reported    No    Warm-up and Cool-down Performed on first and last piece of equipment    Resistance Training Performed Yes    VAD Patient? No    PAD/SET Patient? No      Pain Assessment   Currently in Pain? No/denies                Social History   Tobacco Use  Smoking Status Never   Passive exposure: Never  Smokeless Tobacco Never    Goals Met:  Independence with exercise equipment Exercise tolerated well No report of concerns or symptoms today Strength training completed today  Goals Unmet:  Not Applicable  Comments: Pt able to follow exercise prescription today without complaint.  Will continue to monitor for progression.    Dr. Bethann Punches is Medical Director for [facility] Cardiac Rehabilitation.  Dr. Vida Rigger is Medical Director for [facility] Pulmonary Rehabilitation.

## 2022-12-30 ENCOUNTER — Ambulatory Visit: Payer: HMO | Attending: Physician Assistant | Admitting: Physician Assistant

## 2022-12-30 ENCOUNTER — Encounter: Payer: Self-pay | Admitting: Physician Assistant

## 2022-12-30 VITALS — BP 116/72 | HR 75 | Ht 71.0 in | Wt 190.6 lb

## 2022-12-30 DIAGNOSIS — I214 Non-ST elevation (NSTEMI) myocardial infarction: Secondary | ICD-10-CM

## 2022-12-30 DIAGNOSIS — Z79899 Other long term (current) drug therapy: Secondary | ICD-10-CM

## 2022-12-30 DIAGNOSIS — I471 Supraventricular tachycardia, unspecified: Secondary | ICD-10-CM | POA: Diagnosis not present

## 2022-12-30 DIAGNOSIS — I4891 Unspecified atrial fibrillation: Secondary | ICD-10-CM

## 2022-12-30 DIAGNOSIS — I10 Essential (primary) hypertension: Secondary | ICD-10-CM

## 2022-12-30 DIAGNOSIS — Z789 Other specified health status: Secondary | ICD-10-CM

## 2022-12-30 DIAGNOSIS — E785 Hyperlipidemia, unspecified: Secondary | ICD-10-CM | POA: Diagnosis not present

## 2022-12-30 DIAGNOSIS — Z951 Presence of aortocoronary bypass graft: Secondary | ICD-10-CM | POA: Diagnosis not present

## 2022-12-30 DIAGNOSIS — I9789 Other postprocedural complications and disorders of the circulatory system, not elsewhere classified: Secondary | ICD-10-CM | POA: Diagnosis not present

## 2022-12-30 DIAGNOSIS — I2510 Atherosclerotic heart disease of native coronary artery without angina pectoris: Secondary | ICD-10-CM

## 2022-12-30 MED ORDER — CLOPIDOGREL BISULFATE 75 MG PO TABS: 75.0000 mg | ORAL_TABLET | Freq: Every day | ORAL | 3 refills | Status: DC

## 2022-12-30 MED ORDER — ATORVASTATIN CALCIUM 10 MG PO TABS: 10.0000 mg | ORAL_TABLET | Freq: Every day | ORAL | 3 refills | Status: DC

## 2022-12-30 NOTE — Progress Notes (Signed)
Cardiology Office Note    Date:  12/30/2022   ID:  Rodney Hall., DOB 12-19-1945, MRN [id number]  PCP:  Malva Limes, MD  Cardiologist:  Julien Nordmann, MD  Electrophysiologist:  None   Chief Complaint: Follow-up  History of Present Illness:   Rodney Hall. is a 77 y.o. male with history of CAD status post 5-vessel CABG on 09/07/2022, postoperative A-fib, CKD stage III, HLD, and BPH who presents for follow-up of CAD.   He was admitted to the hospital in 09/2022 with NSTEMI.  LHC on 09/04/2022 showed severe three-vessel CAD including sequential 90% stenoses of the mid/distal LAD, 70% mid LCx stenosis, occluded distal segment of OM1, and 80 to 90% ostial rPDA and proximal rPLA stenoses.  Moderately to severely reduced LV systolic function with an EF of 30 to 35% with apical dyskinesis.  LVEDP 15 mmHg.  In this setting, he was transferred to [facility] for consideration of CABG.  Due to refractory angina, he underwent IABP placement with stable coronary anatomy by cardiac cath on 09/06/2022.  He subsequently underwent 5 vessel CABG on 09/07/2022 with LIMA to LAD, SVG to diagonal, SVG to OM 2, and sequential SVG to rPDA and rPLA.  Postoperative course was notable for bifascicular block leading beta-blocker to be deferred, atrial fibrillation converting to sinus rhythm with amiodarone, volume overload with subsequent AKI, confusion, and agitation.   He was seen in the office on 10/01/2022 and was without symptoms of angina or cardiac decompensation.  He reported prior jitteriness with statin therapy.  Zio patch following discharge showed a predominant rhythm of sinus with an average rate of 74 bpm (range 51 to 125 bpm), 3 episodes of SVT lasting up to 9 beats, and rare atrial and ventricular ectopy.  Patient triggered events were associated with sinus rhythm.  He followed up with CVTS on 10/30/2022 and was doing well from a surgical perspective.  Amiodarone was discontinued at that time.  He was  last seen in the office on 11/06/2022 and remained without symptoms of angina or cardiac decompensation.  Mild chronic positional dizziness persisted.  He was evaluated by the lipid clinic and with recommendation to start Repatha.  He comes in doing well from a cardiac perspective and is without symptoms of angina or cardiac decompensation.  Dizziness has resolved.  No dyspnea, palpitations, presyncope, or syncope.  No lower extremity swelling or progressive orthopnea.  No falls or symptoms concerning for bleeding.  Remains very active at baseline, participating with cardiac rehab 2 days/week and working at the gym 3 days/week.  Main limiting factor at this time is chronic low back pain, for which she has previously required surgical intervention.  He does wonder if he may need another back surgery in the future.  Has not started Repatha, concerns regarding giving himself injections.  Would like to retry low-dose atorvastatin.   Labs independently reviewed: 12/2022 - Hgb 15.2, PLT 266, BUN 13, serum creatinine 1.44, potassium 4.4, albumin 4.1 09/2022 - magnesium 2.3, A1c 5.7, LP(a) 19.3, TC 147, TG 177, HDL 32, LDL 80 10/2021 - AST/ALT normal 05/2020 - TSH normal  Past Medical History:  Diagnosis Date   Abdominal mass    Abdominal pain 06/02/2017   Anxiety    Pt. denies   Arthritis    pt. 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   left above alar crease sidewall of nose/in situ    Past Surgical History:  Procedure Laterality Date   ANTERIOR LAT LUMBAR FUSION Right 07/30/2016   Procedure: Right Lateral two-three Lateral transpsoas interbody fusion with lateral plating/Minimally invasive  decompression at Lumbar two-three;  Surgeon: Ditty, Loura Halt, MD;  Location: [facility] OR;  Service: Neurosurgery;  Laterality: Right;   BACK SURGERY  2012   CATARACT EXTRACTION, BILATERAL     CHOLECYSTECTOMY N/A 07/25/2017   Procedure: LAPAROSCOPIC CHOLECYSTECTOMY WITH INTRAOPERATIVE CHOLANGIOGRAM;  Surgeon: Luretha Murphy, MD;  Location: [facility] ORS;  Service: General;  Laterality: N/A;   COLONOSCOPY WITH PROPOFOL N/A 12/12/2017   Procedure: COLONOSCOPY WITH PROPOFOL;  Surgeon: Scot Jun, MD;  Location: [facility] ENDOSCOPY;  Service: Endoscopy;  Laterality: N/A;   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   Procedure: CORONARY ARTERY BYPASS GRAFTING (CABG) TIMES FIVE, USING LEFT INTERNAL MAMMARY ARTERY AND RIGHT GREATER SAPHENOUS VEIN HARVESTED ENDOSCOPICALLY;  Surgeon: [name], MD;  Location: [facility] OR;  Service: Open Heart Surgery;  Laterality: N/A;   EUS N/A 07/10/2017   Procedure: UPPER ENDOSCOPIC ULTRASOUND (EUS) LINEAR;  Surgeon: Rachael Fee, MD;  Location: [facility] ENDOSCOPY;  Service: Endoscopy;  Laterality: N/A;   EYE SURGERY     HEMI-MICRODISCECTOMY LUMBAR LAMINECTOMY LEVEL 1  09/2018   [facility]   IABP INSERTION N/A 09/06/2022   Procedure: IABP Insertion;  Surgeon: Swaziland, Peter M, MD;  Location: [facility] INVASIVE CV LAB;  Service: Cardiovascular;  Laterality: N/A;   LEFT HEART CATH AND CORONARY ANGIOGRAPHY N/A 09/04/2022   Procedure: LEFT HEART CATH AND CORONARY ANGIOGRAPHY;  Surgeon: Yvonne Kendall, MD;  Location: [facility] INVASIVE CV LAB;  Service: Cardiovascular;  Laterality: N/A;   LEFT HEART CATH AND CORONARY ANGIOGRAPHY N/A 09/06/2022   Procedure: LEFT HEART CATH AND CORONARY ANGIOGRAPHY;  Surgeon: Swaziland, Peter M, MD;  Location: [facility] INVASIVE CV LAB;  Service: Cardiovascular;  Laterality: N/A;   NECK SURGERY  2000   TEE WITHOUT CARDIOVERSION N/A 09/07/2022   Procedure: TRANSESOPHAGEAL ECHOCARDIOGRAM;  Surgeon: [name], MD;  Location: [facility] OR;  Service: Open Heart Surgery;   Laterality: N/A;   TENNIS ELBOW RELEASE/NIRSCHEL PROCEDURE     TONSILLECTOMY AND ADENOIDECTOMY      Current Medications: Current Meds  Medication Sig   acetaminophen (TYLENOL) 325 MG tablet Take 650 mg by mouth every 6 (six) hours as needed for moderate pain or headache.   aspirin EC 81 MG tablet Take 1 tablet (81 mg total) by mouth daily. Swallow whole.   atorvastatin (LIPITOR) 10 MG tablet Take 1 tablet (10 mg total) by mouth daily.   imipramine (TOFRANIL) 50 MG tablet Take 1 tablet (50 mg total) by mouth at bedtime.   tamsulosin (FLOMAX) 0.4 MG CAPS capsule Take 1 capsule (0.4 mg total) by mouth daily after supper.   [DISCONTINUED] clopidogrel (PLAVIX) 75 MG tablet Take 1 tablet (75 mg total) by mouth daily.    Allergies:   Pravastatin and Amoxicillin   Social History   Socioeconomic History   Marital status: Married    Spouse name: Not on file   Number of children: Not on file   Years of education: Not on file   Highest education level: Not on file  Occupational History   Not on  file  Tobacco Use   Smoking status: Never    Passive exposure: Never   Smokeless tobacco: Never  Vaping Use   Vaping status: Never Used  Substance and Sexual Activity   Alcohol use: No    Alcohol/week: 0.0 standard drinks of alcohol   Drug use: No   Sexual activity: Yes  Other Topics Concern   Not on file  Social History Narrative   Not on file   Social Determinants of Health   Financial Resource Strain: Low Risk  (07/03/2022)   Overall Financial Resource Strain (CARDIA)    Difficulty of Paying Living Expenses: Not hard at all  Food Insecurity: No Food Insecurity (09/06/2022)   Hunger Vital Sign    Worried About Running Out of Food in the Last Year: Never true    Ran Out of Food in the Last Year: Never true  Transportation Needs: No Transportation Needs (09/06/2022)   PRAPARE - Administrator, Civil Service (Medical): No    Lack of Transportation (Non-Medical): No  Physical  Activity: Sufficiently Active (07/03/2022)   Exercise Vital Sign    Days of Exercise per Week: 5 days    Minutes of Exercise per Session: 60 min  Stress: No Stress Concern Present (07/03/2022)   [employer] of Occupational Health - Occupational Stress Questionnaire    Feeling of Stress : Not at all  Social Connections: Moderately Isolated (07/03/2022)   Social Connection and Isolation Panel [NHANES]    Frequency of Communication with Friends and Family: More than three times a week    Frequency of Social Gatherings with Friends and Family: Three times a week    Attends Religious Services: Never    Active Member of Clubs or Organizations: No    Attends Banker Meetings: Never    Marital Status: Married     Family History:  The patient's family history includes Cancer in his mother; Diabetes in his brother and father; Heart disease in his sister; Rheumatic fever in his sister.  ROS:   12-point review of systems is negative unless otherwise noted in the HPI.   EKGs/Labs/Other Studies Reviewed:    Studies reviewed were summarized above. The additional studies were reviewed today:  Zio patch 09/2022: Normal sinus rhythm Patient had a min HR of 51 bpm, max HR of 125 bpm, and avg HR of 74 bpm.   3 Supraventricular Tachycardia runs occurred, the run with the fastest interval lasting 4 beats with a max rate of 124 bpm, the longest lasting 9 beats with an avg rate of 103 bpm.    Isolated SVEs were rare (<1.0%), SVE Couplets were rare (<1.0%), and SVE Triplets were rare (<1.0%).  Isolated VEs were rare (<1.0%), VE Couplets were rare (<1.0%), and no VE Triplets were present.   Patient triggered events associated with normal sinus rhythm. __________   Intraoperative TEE 09/07/2022: POST-OP IMPRESSIONS  Overall, there were no significant changes from pre-bypass.  _ Left Ventricle: The left ventricle is unchanged from pre-bypass.  _ Right Ventricle: The right ventricle appears  unchanged from pre-bypass.  _ Aorta: The aorta appears unchanged from pre-bypass.  _ Left Atrium: The left atrium appears unchanged from pre-bypass.  _ Left Atrial Appendage: The left atrial appendage appears unchanged from  pre-bypass.  _ Aortic Valve: The aortic valve appears unchanged from pre-bypass.  _ Mitral Valve: There is mild regurgitation.  _ Tricuspid Valve: There is trace regurgitation.  _ Pulmonic Valve: The pulmonic valve appears unchanged from  pre-bypass.  _ Interatrial Septum: The interatrial septum appears unchanged from  pre-bypass.  _ Interventricular Septum: The interventricular septum appears unchanged  from  pre-bypass.  _ Pericardium: The pericardium appears unchanged from pre-bypass.  __________   Pre-CABG vascular imaging 09/06/2022: Summary:  Right Carotid: Waveforms are unreliable in the presence of an IABP.  Possible area of stenosis is noted within the proximal ICA. Unable  to determine degree of stenosis due to unreliable waveforms.   Left Carotid: Waveforms are unreliable in the presence of an IABP.  Vertebrals: Bilateral vertebral arteries demonstrate antegrade flow.   Left ABI: Resting left ankle-brachial index is within normal range.  Right Upper Extremity: Doppler waveform obliterate with right radial  compression. Doppler waveforms remain within normal limits with right  ulnar compression.  Left Upper Extremity: Doppler waveforms decrease 50% with left radial  compression. Doppler waveforms remain within normal limits with left ulnar  compression.  __________   LHC 09/06/2022:   Mid LM to Ost LAD lesion is 30% stenosed with 30% stenosed side branch in Ost Cx.   Mid LAD-1 lesion is 90% stenosed.   Mid LAD-2 lesion is 90% stenosed.   Dist LAD lesion is 90% stenosed.   Mid Cx to Dist Cx lesion is 70% stenosed.   2nd Diag lesion is 70% stenosed.   2nd Mrg lesion is 100% stenosed.   RPDA lesion is 80% stenosed.   RPAV lesion is 90% stenosed.    LV end diastolic pressure is normal.   No change in severe 3 vessel obstructive CAD Normal LVEDP IABP placed for refractory angina __________   LHC 09/04/2022: Conclusions: Severe three-vessel coronary artery disease, as detailed below.  Culprit lesion for the patient's NSTEMI is most likely occluded distal segment of OM1.  There are also sequential 90% stenoses of the mid/distal LAD, 70% mid LCx lesion, and 80-90% ostial rPDA and proximal rPLA stenoses. Moderately to severely reduced left ventricular systolic function (LVEF 30-35%) with apical dyskinesis. Upper normal left ventricular filling pressure (LVEDP 15 mmHg).   Recommendations: Transfer to [facility] for cardiac surgery consultation for CABG. Restart heparin infusion 2 hours after TR band removal.  Defer addition of P2Y12 inhibitor pending cardiac surgery consultation. Aggressive secondary prevention of coronary artery disease. Maintain net even fluid balance and escalate goal-directed medical therapy for HFrEF as tolerated. Follow-up echocardiogram.   EKG:  EKG is not ordered today.    Recent Labs: 09/08/2022: Magnesium 2.3 10/01/2022: BUN 14; Creatinine, Ser 1.36; Hemoglobin 13.7; Platelets 261; Potassium 4.5; Sodium 138  Recent Lipid Panel    Component Value Date/Time   CHOL 147 09/04/2022 0300   CHOL 181 02/22/2022 1056   TRIG 177 (H) 09/04/2022 0300   HDL 32 (L) 09/04/2022 0300   HDL 34 (L) 02/22/2022 1056   CHOLHDL 4.6 09/04/2022 0300   VLDL 35 09/04/2022 0300   LDLCALC 80 09/04/2022 0300   LDLCALC 108 (H) 02/22/2022 1056    PHYSICAL EXAM:    VS:  BP 116/72 (BP Location: Left Arm, Patient Position: Sitting, Cuff Size: Normal)   Pulse 75   Ht 5\' 11"  (1.803 m)   Wt 190 lb 9.6 oz (86.5 kg)   SpO2 96%   BMI 26.58 kg/m   BMI: Body mass index is 26.58 kg/m.  Physical Exam Vitals reviewed.  Constitutional:      Appearance: He is well-developed.  HENT:     Head: Normocephalic and atraumatic.  Eyes:      General:  Right eye: No discharge.        Left eye: No discharge.  Neck:     Vascular: No JVD.  Cardiovascular:     Rate and Rhythm: Normal rate and regular rhythm.     Pulses:          Posterior tibial pulses are 2+ on the right side and 2+ on the left side.     Heart sounds: Normal heart sounds, S1 normal and S2 normal. Heart sounds not distant. No midsystolic click and no opening snap. No murmur heard.    No friction rub.  Pulmonary:     Effort: Pulmonary effort is normal. No respiratory distress.     Breath sounds: Normal breath sounds. No decreased breath sounds, wheezing, rhonchi or rales.  Chest:     Chest wall: No tenderness.  Abdominal:     General: There is no distension.  Musculoskeletal:     Cervical back: Normal range of motion.     Right lower leg: No edema.     Left lower leg: No edema.  Skin:    General: Skin is warm and dry.     Nails: There is no clubbing.  Neurological:     Mental Status: He is alert and oriented to person, place, and time.  Psychiatric:        Speech: Speech normal.        Behavior: Behavior normal.        Thought Content: Thought content normal.        Judgment: Judgment normal.     Wt Readings from Last 3 Encounters:  12/30/22 190 lb 9.6 oz (86.5 kg)  11/28/22 188 lb (85.3 kg)  11/06/22 186 lb 3.2 oz (84.5 kg)     ASSESSMENT & PLAN:   CAD involving the native coronary arteries with NSTEMI status post 5 vessel CABG without angina: He is doing very well and without symptoms concerning for angina or cardiac decompensation.  Continue aggressive risk factor modification and secondary prevention including aspirin and clopidogrel.  We are undergoing a rechallenge of atorvastatin as outlined below.  He has not been maintained on beta-blocker secondary to prior bifascicular block.  Participating with cardiac rehab.  No indication for further ischemic testing at this time.  Postoperative A-fib: Maintaining sinus rhythm.  No longer on  amiodarone.  No evidence of recurrence at follow-up visit or on outpatient cardiac monitoring.  In this setting, he has not been maintained on OAC.  Should he have documented recurrence of A-fib, OAC will need to be revisited at that time.  PSVT: 3 brief episodes noted on outpatient cardiac monitoring. No sustained arrhythmias.  Not requiring AV nodal blocking medication.  HTN: Blood pressure is well-controlled in the office today.  Not currently requiring antihypertensive therapy.  HLD with statin intolerance: LDL 80 in 09/2022 with target LDL less than 55.  Prefers to avoid PCSK9 inhibitor due to injection component.  Would like to retry low-dose atorvastatin.  Trial of atorvastatin 10 mg daily.  If he tolerates this we will plan to follow-up a fasting lipid and liver panel in 2 to 3 months.  CKD stage IIIa: Followed by nephrology.     Disposition: F/u with Dr. Mariah Milling or an APP in 3 months.   Medication Adjustments/Labs and Tests Ordered: Current medicines are reviewed at length with the patient today.  Concerns regarding medicines are outlined above. Medication changes, Labs and Tests ordered today are summarized above and listed in the Patient Instructions accessible in  Encounters.   Signed, Eula Listen, PA-C 12/30/2022 4:11 PM     [facility] [place] [address] [place], [place] [address] [phone]

## 2022-12-30 NOTE — Patient Instructions (Addendum)
Medication Instructions:  Your physician recommends the following medication changes.  START TAKING: Lipitor 10 mg daily (please let us know if you start to have problems with this medicine)   *If you need a refill on your cardiac medications before your next appointment, please call your pharmacy*   Lab Work: Your provider would like for you to return in 3 months to have the following labs drawn: Fasting Lipid Panel and Liver Function Test.   Please go to [facility] [address] ([facility] [address], [place] [address] You do not need an appointment.  They are open from 7:30 am-4 pm.  Lunch from 1:00 pm- 2:00 pm You DO need to be fasting.     If you have labs (blood work) drawn today and your tests are completely normal, you will receive your results only by: MyChart Message (if you have MyChart) OR A paper copy in the mail If you have any lab test that is abnormal or we need to change your treatment, we will call you to review the results.   Testing/Procedures: None ordered   Follow-Up: At [facility], you and your health needs are our priority.  As part of our continuing mission to provide you with exceptional heart care, we have created designated Provider Care Teams.  These Care Teams include your primary Cardiologist (physician) and Advanced Practice Providers (APPs -  Physician Assistants and Nurse Practitioners) who all work together to provide you with the care you need, when you need it.  We recommend signing up for the patient portal called "MyChart".  Sign up information is provided on this After Visit Summary.  MyChart is used to connect with patients for Virtual Visits (Telemedicine).  Patients are able to view lab/test results, encounter notes, upcoming appointments, etc.  Non-urgent messages can be sent to your provider as well.   To learn more about what you can do with MyChart, go to [email or web address].    Your next  appointment:   3 month(s)  Provider:   You may see Julien Nordmann, MD or one of the following Advanced Practice Providers on your designated Care Team:   Eula Listen, [place]

## 2022-12-31 ENCOUNTER — Encounter: Payer: HMO | Admitting: *Deleted

## 2022-12-31 DIAGNOSIS — Z48812 Encounter for surgical aftercare following surgery on the circulatory system: Secondary | ICD-10-CM | POA: Diagnosis not present

## 2022-12-31 DIAGNOSIS — Z951 Presence of aortocoronary bypass graft: Secondary | ICD-10-CM

## 2022-12-31 NOTE — Progress Notes (Signed)
Daily Session Note  Patient Details  Name: Rodney Hall. MRN: [id number] Date of Birth: [birth year] Referring Provider:   Flowsheet Row Cardiac Rehab from 10/14/2022 in [facility] Cardiac and Pulmonary Rehab  Referring Provider Dr. Julien Nordmann       Encounter Date: 12/31/2022  Check In:  Session Check In - 12/31/22 1119       Check-In   Supervising physician immediately available to respond to emergencies See telemetry face sheet for immediately available ER MD    Location [facility]-Cardiac & Pulmonary Rehab    Staff Present Susann Givens, RN BSN;Jason Wallace Cullens, RDN, LDN;Maxon Conetta BS, , Exercise Physiologist;Donyell Carrell, RN, BSN, CCRP    Virtual Visit No    Medication changes reported     No    Fall or balance concerns reported    No    Warm-up and Cool-down Performed on first and last piece of equipment    Resistance Training Performed Yes    VAD Patient? No    PAD/SET Patient? No      Pain Assessment   Currently in Pain? No/denies                Social History   Tobacco Use  Smoking Status Never   Passive exposure: Never  Smokeless Tobacco Never    Goals Met:  Independence with exercise equipment Exercise tolerated well No report of concerns or symptoms today  Goals Unmet:  Not Applicable  Comments: Pt able to follow exercise prescription today without complaint.  Will continue to monitor for progression.    Dr. Bethann Punches is Medical Director for [facility] Cardiac Rehabilitation.  Dr. Vida Rigger is Medical Director for [facility] Pulmonary Rehabilitation.

## 2023-01-01 ENCOUNTER — Encounter: Payer: Self-pay | Admitting: *Deleted

## 2023-01-01 DIAGNOSIS — Z951 Presence of aortocoronary bypass graft: Secondary | ICD-10-CM

## 2023-01-01 NOTE — Progress Notes (Signed)
Cardiac Individual Treatment Plan  Patient Details  Name: Rodney Hall. MRN: [id number] Date of Birth: 1945-12-07 Referring Provider:   Flowsheet Row Cardiac Rehab from 10/14/2022 in [facility] Cardiac and Pulmonary Rehab  Referring Provider Dr. Julien Nordmann       Initial Encounter Date:  Flowsheet Row Cardiac Rehab from 10/14/2022 in [facility] Cardiac and Pulmonary Rehab  Date 10/14/22       Visit Diagnosis: S/P CABG x 5  Patient's Home Medications on Admission:  Current Outpatient Medications:    acetaminophen (TYLENOL) 325 MG tablet, Take 650 mg by mouth every 6 (six) hours as needed for moderate pain or headache., Disp: , Rfl:    aspirin EC 81 MG tablet, Take 1 tablet (81 mg total) by mouth daily. Swallow whole., Disp: 30 tablet, Rfl: 12   atorvastatin (LIPITOR) 10 MG tablet, Take 1 tablet (10 mg total) by mouth daily., Disp: 90 tablet, Rfl: 3   clopidogrel (PLAVIX) 75 MG tablet, Take 1 tablet (75 mg total) by mouth daily., Disp: 90 tablet, Rfl: 3   imipramine (TOFRANIL) 50 MG tablet, Take 1 tablet (50 mg total) by mouth at bedtime., Disp: 90 tablet, Rfl: 3   tamsulosin (FLOMAX) 0.4 MG CAPS capsule, Take 1 capsule (0.4 mg total) by mouth daily after supper., Disp: 90 capsule, Rfl: 3  Past Medical History: Past Medical History:  Diagnosis Date   Abdominal mass    Abdominal pain 06/02/2017   Anxiety    Pt. denies   Arthritis    pt. 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343  7.363  7.377  7.322  7.357  7.428   PCO2 arterial 32 - 48 mmHg - - - - 37.9  35.8  38.9  39.3  37.8  46.0  38.3  33.7   Bicarbonate 20.0 - 28.0 mmol/L - - - - 20.8  20.3  21.1  22.4  22.2  24.2  23.8  21.5  22.3   TCO2 22 - 32 mmol/L - - - - 22  21  24  22  24  24  23  23  26  25  26  24  23  23    Acid-base deficit 0.0 - 2.0 mmol/L - - - - 5.0  5.0  4.0  3.0  3.0  2.0  2.0  4.0  2.0   O2 Saturation % - - - - 97  96  99  100  100  82  100  100  91     Details       Multiple values from one day are sorted in reverse-chronological order          Exercise Target Goals: Exercise Program Goal: Individual exercise prescription set using results from initial 6 min walk test and THRR while considering  patient's activity barriers and safety.   Exercise Prescription Goal: Initial exercise prescription builds to 30-45 minutes a day of aerobic activity, 2-3 days per week.  Home exercise guidelines will be given to patient during program as part of exercise prescription that the participant will acknowledge.   Education: Aerobic Exercise: - Group verbal and visual presentation on the components of exercise prescription. Introduces F.I.T.T principle from ACSM for exercise prescriptions.  Reviews F.I.T.T. principles of aerobic exercise including progression. Written material given at graduation. Flowsheet Row Cardiac Rehab from 11/14/2022 in [facility] Cardiac and Pulmonary Rehab  Education need identified 10/14/22  Date 10/17/22  Educator NT  Instruction Review Code 1- Verbalizes Understanding        Education: Resistance Exercise: - Group verbal and visual presentation on the components of exercise prescription. Introduces F.I.T.T principle from ACSM for exercise prescriptions  Reviews F.I.T.T. principles of resistance exercise including progression. Written material given at graduation. Flowsheet Row Cardiac Rehab from 11/14/2022 in [facility] Cardiac and Pulmonary Rehab  Education need identified 10/14/22  Date 10/24/22  Educator NT  Instruction Review Code 1- [employer] Understanding        Education: Exercise & Equipment Safety: - Individual verbal instruction and demonstration of equipment use and safety with use of the equipment. Flowsheet Row Cardiac Rehab from 11/14/2022 in [facility] Cardiac and Pulmonary Rehab  Date 10/14/22  Educator MB  Instruction Review Code 1- Verbalizes Understanding       Education: Exercise Physiology & General Exercise Guidelines: - Group verbal and written instruction with models to review the exercise physiology of the cardiovascular system and associated critical values. Provides general exercise guidelines with specific guidelines to those with heart or lung disease.  Flowsheet Row Cardiac Rehab from 11/14/2022 in [facility] Cardiac and Pulmonary Rehab  Education need identified 10/14/22       Education: Flexibility, Balance, Mind/Body Relaxation: - Group verbal and visual presentation with interactive activity on the components of exercise prescription. Introduces F.I.T.T principle from ACSM for exercise prescriptions. Reviews F.I.T.T. principles of flexibility and balance exercise training including progression. Also discusses the mind body connection.  Reviews various relaxation techniques to help reduce and manage stress (i.e. Deep breathing, progressive muscle relaxation, and visualization). Balance handout provided to take home. Written material given at graduation. Flowsheet Row Cardiac Rehab from 11/14/2022 in [facility] Cardiac and Pulmonary Rehab  Date  10/24/22  Educator NT  Instruction Review Code 1- Verbalizes Understanding       Activity Barriers & Risk Stratification:  Activity Barriers & Cardiac Risk Stratification - 10/14/22 1615       Activity Barriers & Cardiac Risk Stratification   Activity Barriers Back Problems;Balance Concerns;History of Falls;Other (comment)    Comments Dizziness    Cardiac Risk Stratification High             6 Minute Walk:  6 Minute Walk     Row Name 10/14/22 1612         6 Minute Walk  Phase Initial     Distance 1090 feet     Walk Time 6 minutes     # of Rest Breaks 0     MPH 2.06     METS 2.24     RPE 9     Perceived Dyspnea  0     VO2 Peak 7.82     Symptoms Yes (comment)     Comments Back pain 3/10     Resting HR 83 bpm     Resting BP 110/64     Resting Oxygen Saturation  98 %     Exercise Oxygen Saturation  during 6 min walk 97 %     Max Ex. HR 104 bpm     Max Ex. BP 110/62     2 Minute Post BP 100/72              Oxygen Initial Assessment:   Oxygen Re-Evaluation:   Oxygen Discharge (Final Oxygen Re-Evaluation):   Initial Exercise Prescription:  Initial Exercise Prescription - 11/05/22 1300       Recumbant Bike   METs 3.49      NuStep   Level 3    Minutes 15    METs 2.7      REL-XR   Level 6    Minutes 15    METs 2.5      T5 Nustep   Level 2    Minutes 15    METs 2.1             Perform Capillary Blood Glucose checks as needed.  Exercise Prescription Changes:   Exercise Prescription Changes     Row Name 10/14/22 1600 10/22/22 1100 11/05/22 1300 11/19/22 1300 12/05/22 1500     Response to Exercise   Blood Pressure (Admit) 110/64 104/52 96/58 98/58  98/60   Blood Pressure (Exercise) 110/62 128/64 136/56 138/62 --   Blood Pressure (Exit) 100/72 104/66 84/50 110/62 116/62   Heart Rate (Admit) 83 bpm 79 bpm 77 bpm 92 bpm 87 bpm   Heart Rate (Exercise) 104 bpm 100 bpm 108 bpm 111 bpm 111 bpm   Heart Rate (Exit) 90 bpm 91 bpm 88  bpm 89 bpm 101 bpm   Oxygen Saturation (Admit) 98 % -- -- -- --   Oxygen Saturation (Exercise) 97 % -- -- -- --   Oxygen Saturation (Exit) 97 % -- -- -- --   Rating of Perceived Exertion (Exercise) 9 11 13 13 15    Perceived Dyspnea (Exercise) 0 -- -- 0 0   Symptoms Back pain -- -- none none   Comments Results -- -- -- --   Duration -- -- Continue with 30 min of aerobic exercise without signs/symptoms of physical distress. Continue with 30 min of aerobic exercise without signs/symptoms of physical distress. Continue with 30 min of aerobic exercise without signs/symptoms of physical distress.   Intensity -- -- THRR unchanged THRR unchanged THRR unchanged     Progression   Progression -- -- Continue to progress workloads to maintain intensity without signs/symptoms of physical distress. Continue to progress workloads to maintain intensity without signs/symptoms of physical distress. Continue to progress workloads to maintain intensity without signs/symptoms of physical distress.   Average METs -- -- 2.7 3.4 3.26     Resistance Training   Training Prescription -- Yes Yes Yes Yes   Weight -- 5lbs 5 lb 5 lb 5 lb   Reps -- 10-15 10-15 10-15 10-15     Interval Training  Interval Training -- -- No No No     Treadmill   MPH -- -- 1.4 -- --   Grade -- -- 0 -- --   Minutes -- -- 15 -- --   METs -- -- 2.07 -- --     Recumbant Bike   Level -- 3 10 11 11    RPM -- 50 -- 50 50   Minutes -- 15 15 15 15    METs -- -- -- 3.85 3.85     NuStep   Level -- 2 -- 1  T6 7  T6   SPM -- 80 -- -- --   Minutes -- 15 -- 15 15   METs -- -- -- 2.4 3.1     REL-XR   Level -- -- -- 9 7   Minutes -- -- -- 15 15   METs -- -- -- -- 4     T5 Nustep   Level -- 2 -- -- --   SPM -- 80 -- -- --   Minutes -- 15 -- -- --     Biostep-RELP   Level -- -- -- -- 4   Minutes -- -- -- -- 15   METs -- -- -- -- 3     Oxygen   Maintain Oxygen Saturation -- -- 88% or higher 88% or higher 88% or higher    Row  Name 12/19/22 1600             Response to Exercise   Blood Pressure (Admit) 122/62       Blood Pressure (Exit) 112/64       Heart Rate (Admit) 81 bpm       Heart Rate (Exercise) 115 bpm       Heart Rate (Exit) 100 bpm       Rating of Perceived Exertion (Exercise) 15       Perceived Dyspnea (Exercise) 0       Symptoms none       Duration Continue with 30 min of aerobic exercise without signs/symptoms of physical distress.       Intensity THRR unchanged         Progression   Progression Continue to progress workloads to maintain intensity without signs/symptoms of physical distress.       Average METs 3.5         Resistance Training   Training Prescription Yes       Weight 5 lb       Reps 10-15         Interval Training   Interval Training No         Recumbant Bike   Level 12  30 watt       Minutes 15       METs 3.09         NuStep   Level 5       Minutes 15       METs 3.7         REL-XR   Level 8       Minutes 15       METs 5.2         T5 Nustep   Level 7  T6       Minutes 15       METs 3.8         Biostep-RELP   Level 2       Minutes 15       METs 2  Oxygen   Maintain Oxygen Saturation 88% or higher                Exercise Comments:   Exercise Comments     Row Name 10/15/22 1131           Exercise Comments First full day of exercise!  Patient was oriented to gym and equipment including functions, settings, policies, and procedures.  Patient's individual exercise prescription and treatment plan were reviewed.  All starting workloads were established based on the results of the 6 minute walk test done at initial orientation visit.  The plan for exercise progression was also introduced and progression will be customized based on patient's performance and goals.                Exercise Goals and Review:   Exercise Goals     Row Name 10/14/22 1647             Exercise Goals   Increase Physical Activity Yes        Intervention Provide advice, education, support and counseling about physical activity/exercise needs.;Develop an individualized exercise prescription for aerobic and resistive training based on initial evaluation findings, risk stratification, comorbidities and participant's personal goals.       Expected Outcomes Short Term: Attend rehab on a regular basis to increase amount of physical activity.;Long Term: Exercising regularly at least 3-5 days a week.;Long Term: Add in home exercise to make exercise part of routine and to increase amount of physical activity.       Increase Strength and Stamina Yes       Intervention Provide advice, education, support and counseling about physical activity/exercise needs.;Develop an individualized exercise prescription for aerobic and resistive training based on initial evaluation findings, risk stratification, comorbidities and participant's personal goals.       Expected Outcomes Short Term: Increase workloads from initial exercise prescription for resistance, speed, and METs.;Short Term: Perform resistance training exercises routinely during rehab and add in resistance training at home;Long Term: Improve cardiorespiratory fitness, muscular endurance and strength as measured by increased METs and functional capacity ( )       Able to understand and use rate of perceived exertion (RPE) scale Yes       Intervention Provide education and explanation on how to use RPE scale       Expected Outcomes Short Term: Able to use RPE daily in rehab to express subjective intensity level;Long Term:  Able to use RPE to guide intensity level when exercising independently       Able to understand and use Dyspnea scale Yes       Intervention Provide education and explanation on how to use Dyspnea scale       Expected Outcomes Short Term: Able to use Dyspnea scale daily in rehab to express subjective sense of shortness of breath during exertion;Long Term: Able to use Dyspnea scale to  guide intensity level when exercising independently       Knowledge and understanding of Target Heart Rate Range (THRR) Yes       Intervention Provide education and explanation of THRR including how the numbers were predicted and where they are located for reference       Expected Outcomes Short Term: Able to state/look up THRR;Long Term: Able to use THRR to govern intensity when exercising independently;Short Term: Able to use daily as guideline for intensity in rehab       Able to check pulse independently Yes  Intervention Provide education and demonstration on how to check pulse in carotid and radial arteries.;Review the importance of being able to check your own pulse for safety during independent exercise       Expected Outcomes Short Term: Able to explain why pulse checking is important during independent exercise;Long Term: Able to check pulse independently and accurately       Understanding of Exercise Prescription Yes       Intervention Provide education, explanation, and written materials on patient's individual exercise prescription       Expected Outcomes Short Term: Able to explain program exercise prescription;Long Term: Able to explain home exercise prescription to exercise independently                Exercise Goals Re-Evaluation :  Exercise Goals Re-Evaluation     Row Name 10/15/22 1131 10/22/22 1132 11/05/22 1346 11/19/22 1346 12/05/22 1522     Exercise Goal Re-Evaluation   Exercise Goals Review Able to understand and use rate of perceived exertion (RPE) scale;Able to understand and use Dyspnea scale;Understanding of Exercise Prescription Increase Physical Activity;Understanding of Exercise Prescription;Increase Strength and Stamina Increase Physical Activity;Understanding of Exercise Prescription;Increase Strength and Stamina Increase Physical Activity;Understanding of Exercise Prescription;Increase Strength and Stamina Increase Physical Activity;Understanding of  Exercise Prescription;Increase Strength and Stamina   Comments Reviewed RPE  and dyspnea scale, THR and program prescription with pt today.  Pt voiced understanding and was given a copy of goals to take home. Bb is off to a good start in the program. He has increased the bike workload. We will continue to monitor his progress in the program Elijah Birk is doing well in rehab. He began walking the treadmill at a speed of 1.4 mph with no incline and did well. He also improved to level 10 on the recumbent bike, level 3 on the T4 nustep, and level 6 on the XR. We will continue to monitor his progress in the program. Elijah Birk is doing well in rehab. He increased his XR level from 6 to 9, and he also increased his level on the recumbent bike from 10 to 11. We will continue to monitor his progress in the program. Elijah Birk continues to do well in rehab. He was recently able to increase his level on the T6 nustep from 1 to 7. He has also been able to maintain an intensity of level 11 on the recumbent bike. We will continue to monitor his progress in the program.   Expected Outcomes Short: Use RPE daily to regulate intensity. Long: Follow program prescription in THR. Short: Continue to follow current exercise prescription and progressivley increase bike workload. Long: Continue exercise to improve strength and stamina. Short: Continue to progressively increase treadmill workload. Long: Continue exercise to improve strength and stamina. Short: Continue to follow current exercise prescription, and progressively increase workloads. Long: Continue exercise to improve strength and stamina. Short: Continue to follow current exercise prescription, and progressively increase workloads. Long: Continue exercise to improve strength and stamina.    Row Name 12/19/22 1636             Exercise Goal Re-Evaluation   Exercise Goals Review Increase Physical Activity;Understanding of Exercise Prescription;Increase Strength and Stamina        Comments Elijah Birk continues to do well in rehab. He was recently able to increase his level on the recumbent bike from level 11 to 12. He has also been able to maintain his intensity on the T6. We will continue to monitor his progress in the  program.       Expected Outcomes Short: Continue to follow current exercise prescription, and progressively increase workloads. Long: Continue exercise to improve strength and stamina.                Discharge Exercise Prescription (Final Exercise Prescription Changes):  Exercise Prescription Changes - 12/19/22 1600       Response to Exercise   Blood Pressure (Admit) 122/62    Blood Pressure (Exit) 112/64    Heart Rate (Admit) 81 bpm    Heart Rate (Exercise) 115 bpm    Heart Rate (Exit) 100 bpm    Rating of Perceived Exertion (Exercise) 15    Perceived Dyspnea (Exercise) 0    Symptoms none    Duration Continue with 30 min of aerobic exercise without signs/symptoms of physical distress.    Intensity THRR unchanged      Progression   Progression Continue to progress workloads to maintain intensity without signs/symptoms of physical distress.    Average METs 3.5      Resistance Training   Training Prescription Yes    Weight 5 lb    Reps 10-15      Interval Training   Interval Training No      Recumbant Bike   Level 12   30 watt   Minutes 15    METs 3.09      NuStep   Level 5    Minutes 15    METs 3.7      REL-XR   Level 8    Minutes 15    METs 5.2      T5 Nustep   Level 7   T6   Minutes 15    METs 3.8      Biostep-RELP   Level 2    Minutes 15    METs 2      Oxygen   Maintain Oxygen Saturation 88% or higher             Nutrition:  Target Goals: Understanding of nutrition guidelines, daily intake of sodium 1500mg , cholesterol 200mg , calories 30% from fat and 7% or less from saturated fats, daily to have 5 or more servings of fruits and vegetables.  Education: All About Nutrition: -Group instruction provided by  verbal, written material, interactive activities, discussions, models, and posters to present general guidelines for heart healthy nutrition including fat, fiber, MyPlate, the role of sodium in heart healthy nutrition, utilization of the nutrition label, and utilization of this knowledge for meal planning. Follow up email sent as well. Written material given at graduation. Flowsheet Row Cardiac Rehab from 11/14/2022 in [facility] Cardiac and Pulmonary Rehab  Education need identified 10/14/22       Biometrics:  Pre Biometrics - 10/14/22 1647       Pre Biometrics   Height 5' 11.5" (1.816 m)    Weight 184 lb 14.4 oz (83.9 kg)    Waist Circumference 40.5 inches    Hip Circumference 40.5 inches    Waist to Hip Ratio 1 %    BMI (Calculated) 25.43    Single Leg Stand 2 seconds              Nutrition Therapy Plan and Nutrition Goals:  Nutrition Therapy & Goals - 10/14/22 1650       Nutrition Therapy   RD appointment deferred Yes      Intervention Plan   Intervention Prescribe, educate and counsel regarding individualized specific dietary modifications aiming towards targeted core components such as  weight, hypertension, lipid management, diabetes, heart failure and other comorbidities.    Expected Outcomes Short Term Goal: A plan has been developed with personal nutrition goals set during dietitian appointment.;Long Term Goal: Adherence to prescribed nutrition plan.;Short Term Goal: Understand basic principles of dietary content, such as calories, fat, sodium, cholesterol and nutrients.             Nutrition Assessments:  MEDIFICTS Score Key: >=70 Need to make dietary changes  40-70 Heart Healthy Diet <= 40 Therapeutic Level Cholesterol Diet  Flowsheet Row Cardiac Rehab from 10/14/2022 in [facility] Cardiac and Pulmonary Rehab  Picture Your Plate Total Score on Admission 76      Picture Your Plate Scores: <84 Unhealthy dietary pattern with much room for improvement. 41-50 Dietary  pattern unlikely to meet recommendations for good health and room for improvement. 51-60 More healthful dietary pattern, with some room for improvement.  >60 Healthy dietary pattern, although there may be some specific behaviors that could be improved.    Nutrition Goals Re-Evaluation:   Nutrition Goals Discharge (Final Nutrition Goals Re-Evaluation):   Psychosocial: Target Goals: Acknowledge presence or absence of significant depression and/or stress, maximize coping skills, provide positive support system. Participant is able to verbalize types and ability to use techniques and skills needed for reducing stress and depression.   Education: Stress, Anxiety, and Depression - Group verbal and visual presentation to define topics covered.  Reviews how body is impacted by stress, anxiety, and depression.  Also discusses healthy ways to reduce stress and to treat/manage anxiety and depression.  Written material given at graduation.   Education: Sleep Hygiene -Provides group verbal and written instruction about how sleep can affect your health.  Define sleep hygiene, discuss sleep cycles and impact of sleep habits. Review good sleep hygiene tips.    Initial Review & Psychosocial Screening:  Initial Psych Review & Screening - 10/10/22 1111       Initial Review   Current issues with None Identified      Family Dynamics   Good Support System? Yes    Comments He can look to his wife for support. Now that his health is back under control he does not have any stressors or deprression.      Barriers   Psychosocial barriers to participate in program The patient should benefit from training in stress management and relaxation.;There are no identifiable barriers or psychosocial needs.      Screening Interventions   Interventions Encouraged to exercise;To provide support and resources with identified psychosocial needs;Provide feedback about the scores to participant    Expected Outcomes Short  Term goal: Utilizing psychosocial counselor, staff and physician to assist with identification of specific Stressors or current issues interfering with healing process. Setting desired goal for each stressor or current issue identified.;Long Term Goal: Stressors or current issues are controlled or eliminated.;Short Term goal: Identification and review with participant of any Quality of Life or Depression concerns found by scoring the questionnaire.;Long Term goal: The participant improves quality of Life and PHQ9 Scores as seen by post scores and/or verbalization of changes             Quality of Life Scores:   Quality of Life - 10/14/22 1652       Quality of Life   Select Quality of Life      Quality of Life Scores   Health/Function Pre 19.12 %    Socioeconomic Pre 27.92 %    Psych/Spiritual Pre 28.07 %  Family Pre 30 %    GLOBAL Pre 24.6 %            Scores of 19 and below usually indicate a poorer quality of life in these areas.  A difference of  2-3 points is a clinically meaningful difference.  A difference of 2-3 points in the total score of the Quality of Life Index has been associated with significant improvement in overall quality of life, self-image, physical symptoms, and general health in studies assessing change in quality of life.  PHQ-9: Review Flowsheet  More data exists      10/14/2022 10/11/2022 07/03/2022 10/10/2021 10/09/2021  Depression screen PHQ 2/9  Decreased Interest 0 0 0 0 0 0  Down, Depressed, Hopeless 0 0 0 0 0 0  PHQ - 2 Score 0 0 0 0 0 0  Altered sleeping 1 0 - - 0  Tired, decreased energy 0 0 - - 0  Change in appetite 0 0 - - 0  Feeling bad or failure about yourself  0 0 - - 0  Trouble concentrating 0 1 - - 0  Moving slowly or fidgety/restless 0 0 - - 0  Suicidal thoughts 0 0 - - 0  PHQ-9 Score 1 1 - - 0  Difficult doing work/chores - Not difficult at all - - Not difficult at all    Details       Multiple values from one day are sorted in  reverse-chronological order        Interpretation of Total Score  Total Score Depression Severity:  1-4 = Minimal depression, 5-9 = Mild depression, 10-14 = Moderate depression, 15-19 = Moderately severe depression, 20-27 = Severe depression   Psychosocial Evaluation and Intervention:  Psychosocial Evaluation - 10/10/22 1112       Psychosocial Evaluation & Interventions   Interventions Encouraged to exercise with the program and follow exercise prescription;Relaxation education;Stress management education    Comments He can look to his wife for support. Now that his health is back under control he does not have any stressors or deprression.    Expected Outcomes Short: Start [facility] to help with mood. Long: Maintain a healthy mental state    Continue Psychosocial Services  Follow up required by staff             Psychosocial Re-Evaluation:   Psychosocial Discharge (Final Psychosocial Re-Evaluation):   Vocational Rehabilitation: Provide vocational rehab assistance to qualifying candidates.   Vocational Rehab Evaluation & Intervention:   Education: Education Goals: Education classes will be provided on a variety of topics geared toward better understanding of heart health and risk factor modification. Participant will state understanding/return demonstration of topics presented as noted by education test scores.  Learning Barriers/Preferences:  Learning Barriers/Preferences - 10/10/22 1110       Learning Barriers/Preferences   Learning Barriers None    Learning Preferences None             General Cardiac Education Topics:  AED/CPR: - Group verbal and written instruction with the use of models to demonstrate the basic use of the AED with the basic ABC's of resuscitation.   Anatomy and Cardiac Procedures: - Group verbal and visual presentation and models provide information about basic cardiac anatomy and function. Reviews the testing methods done to  diagnose heart disease and the outcomes of the test results. Describes the treatment choices: Medical Management, Angioplasty, or Coronary Bypass Surgery for treating various heart conditions including Myocardial Infarction, Angina, Valve Disease, and Cardiac Arrhythmias.  Written material given at graduation. Flowsheet Row Cardiac Rehab from 11/14/2022 in [facility] Cardiac and Pulmonary Rehab  Education need identified 10/14/22  Date 11/14/22  Educator SB  Instruction Review Code 1- Verbalizes Understanding       Medication Safety: - Group verbal and visual instruction to review commonly prescribed medications for heart and lung disease. Reviews the medication, class of the drug, and side effects. Includes the steps to properly store meds and maintain the prescription regimen.  Written material given at graduation.   Intimacy: - Group verbal instruction through game format to discuss how heart and lung disease can affect sexual intimacy. Written material given at graduation.. Flowsheet Row Cardiac Rehab from 11/14/2022 in [facility] Cardiac and Pulmonary Rehab  Date 10/17/22  Educator NT  Instruction Review Code 1- Verbalizes Understanding       Know Your Numbers and Heart Failure: - Group verbal and visual instruction to discuss disease risk factors for cardiac and pulmonary disease and treatment options.  Reviews associated critical values for Overweight/Obesity, Hypertension, Cholesterol, and Diabetes.  Discusses basics of heart failure: signs/symptoms and treatments.  Introduces Heart Failure Zone chart for action plan for heart failure.  Written material given at graduation. Flowsheet Row Cardiac Rehab from 11/14/2022 in [facility] Cardiac and Pulmonary Rehab  Education need identified 10/14/22       Infection Prevention: - Provides verbal and written material to individual with discussion of infection control including proper hand washing and proper equipment cleaning during exercise  session. Flowsheet Row Cardiac Rehab from 11/14/2022 in [facility] Cardiac and Pulmonary Rehab  Date 10/14/22  Educator MB  Instruction Review Code 1- Verbalizes Understanding       Falls Prevention: - Provides verbal and written material to individual with discussion of falls prevention and safety. Flowsheet Row Cardiac Rehab from 11/14/2022 in [facility] Cardiac and Pulmonary Rehab  Date 10/14/22  Educator MB  Instruction Review Code 1- Verbalizes Understanding       Other: -Provides group and verbal instruction on various topics (see comments)   Knowledge Questionnaire Score:  Knowledge Questionnaire Score - 10/14/22 1654       Knowledge Questionnaire Score   Pre Score 20/26             Core Components/Risk Factors/Patient Goals at Admission:  Personal Goals and Risk Factors at Admission - 10/14/22 1658       Core Components/Risk Factors/Patient Goals on Admission    Weight Management Yes;Weight Maintenance    Intervention Weight Management: Develop a combined nutrition and exercise program designed to reach desired caloric intake, while maintaining appropriate intake of nutrient and fiber, sodium and fats, and appropriate energy expenditure required for the weight goal.;Weight Management: Provide education and appropriate resources to help participant work on and attain dietary goals.;Weight Management/Obesity: Establish reasonable short term and long term weight goals.    Admit Weight 184 lb 14.4 oz (83.9 kg)    Goal Weight: Short Term 184 lb (83.5 kg)    Goal Weight: Long Term 184 lb (83.5 kg)    Expected Outcomes Short Term: Continue to assess and modify interventions until short term weight is achieved;Long Term: Adherence to nutrition and physical activity/exercise program aimed toward attainment of established weight goal;Weight Maintenance: Understanding of the daily nutrition guidelines, which includes 25-35% calories from fat, 7% or less cal from saturated fats, less than  200mg  cholesterol, less than 1.5gm of sodium, & 5 or more servings of fruits and vegetables daily;Understanding recommendations for meals to include 15-35% energy as  protein, 25-35% energy from fat, 35-60% energy from carbohydrates, less than 200mg  of dietary cholesterol, 20-35 gm of total fiber daily;Understanding of distribution of calorie intake throughout the day with the consumption of 4-5 meals/snacks    Heart Failure Yes    Intervention Provide a combined exercise and nutrition program that is supplemented with education, support and counseling about heart failure. Directed toward relieving symptoms such as shortness of breath, decreased exercise tolerance, and extremity edema.    Expected Outcomes Improve functional capacity of life;Short term: Attendance in program 2-3 days a week with increased exercise capacity. Reported lower sodium intake. Reported increased fruit and vegetable intake. Reports medication compliance.;Short term: Daily weights obtained and reported for increase. Utilizing diuretic protocols set by physician.;Long term: Adoption of self-care skills and reduction of barriers for early signs and symptoms recognition and intervention leading to self-care maintenance.    Lipids Yes    Intervention Provide education and support for participant on nutrition & aerobic/resistive exercise along with prescribed medications to achieve LDL 70mg , HDL >40mg .    Expected Outcomes Short Term: Participant states understanding of desired cholesterol values and is compliant with medications prescribed. Participant is following exercise prescription and nutrition guidelines.;Long Term: Cholesterol controlled with medications as prescribed, with individualized exercise RX and with personalized nutrition plan. Value goals: LDL < 70mg , HDL > 40 mg.             Education:Diabetes - Individual verbal and written instruction to review signs/symptoms of diabetes, desired ranges of glucose level  fasting, after meals and with exercise. Acknowledge that pre and post exercise glucose checks will be done for 3 sessions at entry of program.   Core Components/Risk Factors/Patient Goals Review:    Core Components/Risk Factors/Patient Goals at Discharge (Final Review):    ITP Comments:  ITP Comments     Row Name 10/10/22 1110 10/14/22 1611 10/14/22 1705 10/15/22 1131 11/06/22 0913   ITP Comments Virtual Visit completed. Patient informed on EP and RD appointment and 6 Minute walk test. Patient also informed of patient health questionnaires on My Chart. Patient Verbalizes understanding. Visit diagnosis can be found in [facility] 09/10/2022. Completed and gym orientation. Initial ITP created and sent for review to Dr. Bethann Punches, Medical Director. Completed and gym orientation. Initial ITP created and sent for review to Dr. Bethann Punches, Medical Director. First full day of exercise!  Patient was oriented to gym and equipment including functions, settings, policies, and procedures.  Patient's individual exercise prescription and treatment plan were reviewed.  All starting workloads were established based on the results of the 6 minute walk test done at initial orientation visit.  The plan for exercise progression was also introduced and progression will be customized based on patient's performance and goals. 30 Day review completed. Medical Director ITP review done, changes made as directed, and signed approval by Medical Director.     new to program    Row Name 12/04/22 0944 01/01/23 0829         ITP Comments 30 Day review completed. Medical Director ITP review done, changes made as directed, and signed approval by Medical Director. 30 Day review completed. Medical Director ITP review done, changes made as directed, and signed approval by Medical Director.               Comments:

## 2023-01-02 ENCOUNTER — Encounter: Payer: HMO | Admitting: *Deleted

## 2023-01-02 DIAGNOSIS — Z48812 Encounter for surgical aftercare following surgery on the circulatory system: Secondary | ICD-10-CM | POA: Diagnosis not present

## 2023-01-02 DIAGNOSIS — Z951 Presence of aortocoronary bypass graft: Secondary | ICD-10-CM

## 2023-01-02 NOTE — Progress Notes (Signed)
Daily Session Note  Patient Details  Name: Rodney Hall. MRN: [id number] Date of Birth: 1946-02-14 Referring Provider:   Flowsheet Row Cardiac Rehab from 10/14/2022 in [facility] Cardiac and Pulmonary Rehab  Referring Provider Dr. Julien Nordmann       Encounter Date: 01/02/2023  Check In:  Session Check In - 01/02/23 1057       Check-In   Supervising physician immediately available to respond to emergencies See telemetry face sheet for immediately available ER MD    Location [facility]-Cardiac & Pulmonary Rehab    Staff Present Ronette Deter, BS, Exercise Physiologist;Meredith Jewel Baize, RN BSN;Joseph Shelbie Proctor, RN, [place]    Virtual Visit No    Medication changes reported     No    Fall or balance concerns reported    No    Warm-up and Cool-down Performed on first and last piece of equipment    Resistance Training Performed Yes    VAD Patient? No    PAD/SET Patient? No      Pain Assessment   Currently in Pain? No/denies                Social History   Tobacco Use  Smoking Status Never   Passive exposure: Never  Smokeless Tobacco Never    Goals Met:  Independence with exercise equipment Exercise tolerated well No report of concerns or symptoms today Strength training completed today  Goals Unmet:  Not Applicable  Comments: Pt able to follow exercise prescription today without complaint.  Will continue to monitor for progression.    Dr. Bethann Punches is Medical Director for [facility] Cardiac Rehabilitation.  Dr. Vida Rigger is Medical Director for [facility] Pulmonary Rehabilitation.

## 2023-01-07 DIAGNOSIS — Z6826 Body mass index (BMI) 26.0-26.9, adult: Secondary | ICD-10-CM | POA: Diagnosis not present

## 2023-01-07 DIAGNOSIS — M4727 Other spondylosis with radiculopathy, lumbosacral region: Secondary | ICD-10-CM | POA: Diagnosis not present

## 2023-01-09 ENCOUNTER — Encounter: Payer: HMO | Attending: Cardiovascular Disease | Admitting: *Deleted

## 2023-01-09 DIAGNOSIS — Z951 Presence of aortocoronary bypass graft: Secondary | ICD-10-CM

## 2023-01-09 DIAGNOSIS — Z48812 Encounter for surgical aftercare following surgery on the circulatory system: Secondary | ICD-10-CM | POA: Diagnosis present

## 2023-01-09 DIAGNOSIS — I252 Old myocardial infarction: Secondary | ICD-10-CM | POA: Diagnosis not present

## 2023-01-09 NOTE — Progress Notes (Signed)
Daily Session Note  Patient Details  Name: Rodney Hall. MRN: [id number] Date of Birth: 11/03/1945 Referring Provider:   Flowsheet Row Cardiac Rehab from 10/14/2022 in [facility] Cardiac and Pulmonary Rehab  Referring Provider Dr. Julien Nordmann       Encounter Date: 01/09/2023  Check In:  Session Check In - 01/09/23 1123       Check-In   Supervising physician immediately available to respond to emergencies See telemetry face sheet for immediately available ER MD    Location [facility]-Cardiac & Pulmonary Rehab    Staff Present Ronette Deter, BS, Exercise Physiologist;Susanne Bice, RN, BSN, CCRP;Meredith [place], RN BSN;Joseph [place], Guinevere Ferrari, RN, [place]    Virtual Visit No    Medication changes reported     No    Fall or balance concerns reported    No    Warm-up and Cool-down Performed on first and last piece of equipment    Resistance Training Performed Yes    VAD Patient? No    PAD/SET Patient? No      Pain Assessment   Currently in Pain? No/denies                Social History   Tobacco Use  Smoking Status Never   Passive exposure: Never  Smokeless Tobacco Never    Goals Met:  Independence with exercise equipment Exercise tolerated well No report of concerns or symptoms today Strength training completed today  Goals Unmet:  Not Applicable  Comments: Pt able to follow exercise prescription today without complaint.  Will continue to monitor for progression.    Dr. Bethann Punches is Medical Director for [facility] Cardiac Rehabilitation.  Dr. Vida Rigger is Medical Director for [facility] Pulmonary Rehabilitation.

## 2023-01-14 DIAGNOSIS — Z951 Presence of aortocoronary bypass graft: Secondary | ICD-10-CM

## 2023-01-14 DIAGNOSIS — Z48812 Encounter for surgical aftercare following surgery on the circulatory system: Secondary | ICD-10-CM | POA: Diagnosis not present

## 2023-01-14 NOTE — Progress Notes (Signed)
Daily Session Note  Patient Details  Name: Rodney Hall. MRN: [id number] Date of Birth: Apr 19, 1945 Referring Provider:   Flowsheet Row Cardiac Rehab from 10/14/2022 in [facility] Cardiac and Pulmonary Rehab  Referring Provider Dr. Julien Nordmann       Encounter Date: 01/14/2023  Check In:      Social History   Tobacco Use  Smoking Status Never   Passive exposure: Never  Smokeless Tobacco Never    Goals Met:  Independence with exercise equipment Exercise tolerated well No report of concerns or symptoms today Strength training completed today  Goals Unmet:  Not Applicable  Comments: Pt able to follow exercise prescription today without complaint.  Will continue to monitor for progression.   Dr. Bethann Punches is Medical Director for [facility] Cardiac Rehabilitation.  Dr. Vida Rigger is Medical Director for [facility] Pulmonary Rehabilitation.

## 2023-01-16 ENCOUNTER — Encounter: Payer: HMO | Admitting: *Deleted

## 2023-01-16 DIAGNOSIS — Z48812 Encounter for surgical aftercare following surgery on the circulatory system: Secondary | ICD-10-CM | POA: Diagnosis not present

## 2023-01-16 DIAGNOSIS — Z951 Presence of aortocoronary bypass graft: Secondary | ICD-10-CM

## 2023-01-16 NOTE — Progress Notes (Signed)
Daily Session Note  Patient Details  Name: Rodney Hall. MRN: [id number] Date of Birth: Dec 29, 1945 Referring Provider:   Flowsheet Row Cardiac Rehab from 10/14/2022 in [facility] Cardiac and Pulmonary Rehab  Referring Provider Dr. Julien Nordmann       Encounter Date: 01/16/2023  Check In:  Session Check In - 01/16/23 1043       Check-In   Supervising physician immediately available to respond to emergencies See telemetry face sheet for immediately available ER MD    Location [facility]-Cardiac & Pulmonary Rehab    Staff Present Cora Collum, RN, BSN, CCRP;Meredith Jewel Baize, RN BSN;Joseph [place], Guinevere Ferrari, RN, [place]    Virtual Visit No    Medication changes reported     No    Fall or balance concerns reported    No    Warm-up and Cool-down Performed on first and last piece of equipment    Resistance Training Performed Yes    VAD Patient? No    PAD/SET Patient? No      Pain Assessment   Currently in Pain? No/denies                Social History   Tobacco Use  Smoking Status Never   Passive exposure: Never  Smokeless Tobacco Never    Goals Met:  Independence with exercise equipment Exercise tolerated well No report of concerns or symptoms today Strength training completed today  Goals Unmet:  Not Applicable  Comments: Pt able to follow exercise prescription today without complaint.  Will continue to monitor for progression.    Dr. Bethann Punches is Medical Director for [facility] Cardiac Rehabilitation.  Dr. Vida Rigger is Medical Director for [facility] Pulmonary Rehabilitation.

## 2023-01-21 ENCOUNTER — Encounter: Payer: HMO | Admitting: *Deleted

## 2023-01-21 DIAGNOSIS — Z48812 Encounter for surgical aftercare following surgery on the circulatory system: Secondary | ICD-10-CM | POA: Diagnosis not present

## 2023-01-21 DIAGNOSIS — Z951 Presence of aortocoronary bypass graft: Secondary | ICD-10-CM

## 2023-01-21 NOTE — Progress Notes (Signed)
Daily Session Note  Patient Details  Name: Rodney Hall. MRN: [id number] Date of Birth: 01-15-1946 Referring Provider:   Flowsheet Row Cardiac Rehab from 10/14/2022 in [facility] Cardiac and Pulmonary Rehab  Referring Provider Dr. Julien Nordmann       Encounter Date: 01/21/2023  Check In:  Session Check In - 01/21/23 1131       Check-In   Supervising physician immediately available to respond to emergencies See telemetry face sheet for immediately available ER MD    Location [facility]-Cardiac & Pulmonary Rehab    Staff Present Cora Collum, RN, BSN, CCRP;Meredith Jewel Baize, RN BSN;Jason Wallace [place], RDN, LDN;Maxon Conetta BS, , Exercise Physiologist    Virtual Visit No    Medication changes reported     No    Fall or balance concerns reported    No    Warm-up and Cool-down Performed on first and last piece of equipment    Resistance Training Performed Yes    VAD Patient? No    PAD/SET Patient? No      Pain Assessment   Currently in Pain? No/denies                Social History   Tobacco Use  Smoking Status Never   Passive exposure: Never  Smokeless Tobacco Never    Goals Met:  Independence with exercise equipment Exercise tolerated well No report of concerns or symptoms today  Goals Unmet:  Not Applicable  Comments: Pt able to follow exercise prescription today without complaint.  Will continue to monitor for progression.    Dr. Bethann Punches is Medical Director for [facility] Cardiac Rehabilitation.  Dr. Vida Rigger is Medical Director for [facility] Pulmonary Rehabilitation.

## 2023-01-22 ENCOUNTER — Encounter: Payer: Self-pay | Admitting: *Deleted

## 2023-01-22 DIAGNOSIS — Z951 Presence of aortocoronary bypass graft: Secondary | ICD-10-CM

## 2023-01-22 NOTE — Progress Notes (Signed)
Cardiac Individual Treatment Plan  Patient Details  Name: Rodney Hall. MRN: [id number] Date of Birth: 1945-11-22 Referring Provider:   Flowsheet Row Cardiac Rehab from 10/14/2022 in [facility] Cardiac and Pulmonary Rehab  Referring Provider Dr. Julien Nordmann       Initial Encounter Date:  Flowsheet Row Cardiac Rehab from 10/14/2022 in [facility] Cardiac and Pulmonary Rehab  Date 10/14/22       Visit Diagnosis: S/P CABG x 5  Patient's Home Medications on Admission:  Current Outpatient Medications:    acetaminophen (TYLENOL) 325 MG tablet, Take 650 mg by mouth every 6 (six) hours as needed for moderate pain or headache., Disp: , Rfl:    aspirin EC 81 MG tablet, Take 1 tablet (81 mg total) by mouth daily. Swallow whole., Disp: 30 tablet, Rfl: 12   atorvastatin (LIPITOR) 10 MG tablet, Take 1 tablet (10 mg total) by mouth daily., Disp: 90 tablet, Rfl: 3   clopidogrel (PLAVIX) 75 MG tablet, Take 1 tablet (75 mg total) by mouth daily., Disp: 90 tablet, Rfl: 3   imipramine (TOFRANIL) 50 MG tablet, Take 1 tablet (50 mg total) by mouth at bedtime., Disp: 90 tablet, Rfl: 3   tamsulosin (FLOMAX) 0.4 MG CAPS capsule, Take 1 capsule (0.4 mg total) by mouth daily after supper., Disp: 90 capsule, Rfl: 3  Past Medical History: Past Medical History:  Diagnosis Date   Abdominal mass    Abdominal pain 06/02/2017   Anxiety    Pt. denies   Arthritis    pt. 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343  7.363  7.377  7.322  7.357  7.428   PCO2 arterial 32 - 48 mmHg - - - - 37.9  35.8  38.9  39.3  37.8  46.0  38.3  33.7   Bicarbonate 20.0 - 28.0 mmol/L - - - - 20.8  20.3  21.1  22.4  22.2  24.2  23.8  21.5  22.3   TCO2 22 - 32 mmol/L - - - - 22  21  24  22  24  24  23  23  26  25  26  24  23  23    Acid-base deficit 0.0 - 2.0 mmol/L - - - - 5.0  5.0  4.0  3.0  3.0  2.0  2.0  4.0  2.0   O2 Saturation % - - - - 97  96  99  100  100  82  100  100  91     Details       Multiple values from one day are sorted in reverse-chronological order          Exercise Target Goals: Exercise Program Goal: Individual exercise prescription set using results from initial 6 min walk test and THRR while considering  patient's activity barriers and safety.   Exercise Prescription Goal: Initial exercise prescription builds to 30-45 minutes a day of aerobic activity, 2-3 days per week.  Home exercise guidelines will be given to patient during program as part of exercise prescription that the participant will acknowledge.   Education: Aerobic Exercise: - Group verbal and visual presentation on the components of exercise prescription. Introduces F.I.T.T principle from ACSM for exercise prescriptions.  Reviews F.I.T.T. principles of aerobic exercise including progression. Written material given at graduation. Flowsheet Row Cardiac Rehab from 11/14/2022 in [facility] Cardiac and Pulmonary Rehab  Education need identified 10/14/22  Date 10/17/22  Educator NT  Instruction Review Code 1- Verbalizes Understanding        Education: Resistance Exercise: - Group verbal and visual presentation on the components of exercise prescription. Introduces F.I.T.T principle from ACSM for exercise prescriptions  Reviews F.I.T.T. principles of resistance exercise including progression. Written material given at graduation. Flowsheet Row Cardiac Rehab from 11/14/2022 in [facility] Cardiac and Pulmonary Rehab  Education need identified 10/14/22  Date 10/24/22  Educator NT  Instruction Review Code 1- [employer] Understanding        Education: Exercise & Equipment Safety: - Individual verbal instruction and demonstration of equipment use and safety with use of the equipment. Flowsheet Row Cardiac Rehab from 11/14/2022 in [facility] Cardiac and Pulmonary Rehab  Date 10/14/22  Educator MB  Instruction Review Code 1- Verbalizes Understanding       Education: Exercise Physiology & General Exercise Guidelines: - Group verbal and written instruction with models to review the exercise physiology of the cardiovascular system and associated critical values. Provides general exercise guidelines with specific guidelines to those with heart or lung disease.  Flowsheet Row Cardiac Rehab from 11/14/2022 in [facility] Cardiac and Pulmonary Rehab  Education need identified 10/14/22       Education: Flexibility, Balance, Mind/Body Relaxation: - Group verbal and visual presentation with interactive activity on the components of exercise prescription. Introduces F.I.T.T principle from ACSM for exercise prescriptions. Reviews F.I.T.T. principles of flexibility and balance exercise training including progression. Also discusses the mind body connection.  Reviews various relaxation techniques to help reduce and manage stress (i.e. Deep breathing, progressive muscle relaxation, and visualization). Balance handout provided to take home. Written material given at graduation. Flowsheet Row Cardiac Rehab from 11/14/2022 in [facility] Cardiac and Pulmonary Rehab  Date  10/24/22  Educator NT  Instruction Review Code 1- Verbalizes Understanding       Activity Barriers & Risk Stratification:  Activity Barriers & Cardiac Risk Stratification - 10/14/22 1615       Activity Barriers & Cardiac Risk Stratification   Activity Barriers Back Problems;Balance Concerns;History of Falls;Other (comment)    Comments Dizziness    Cardiac Risk Stratification High             6 Minute Walk:  6 Minute Walk     Row Name 10/14/22 1612         6 Minute Walk  Phase Initial     Distance 1090 feet     Walk Time 6 minutes     # of Rest Breaks 0     MPH 2.06     METS 2.24     RPE 9     Perceived Dyspnea  0     VO2 Peak 7.82     Symptoms Yes (comment)     Comments Back pain 3/10     Resting HR 83 bpm     Resting BP 110/64     Resting Oxygen Saturation  98 %     Exercise Oxygen Saturation  during 6 min walk 97 %     Max Ex. HR 104 bpm     Max Ex. BP 110/62     2 Minute Post BP 100/72              Oxygen Initial Assessment:   Oxygen Re-Evaluation:   Oxygen Discharge (Final Oxygen Re-Evaluation):   Initial Exercise Prescription:  Initial Exercise Prescription - 01/02/23 1500       Recumbant Bike   Watts 62             Perform Capillary Blood Glucose checks as needed.  Exercise Prescription Changes:   Exercise Prescription Changes     Row Name 10/14/22 1600 10/22/22 1100 11/05/22 1300 11/19/22 1300 12/05/22 1500     Response to Exercise   Blood Pressure (Admit) 110/64 104/52 96/58 98/58  98/60   Blood Pressure (Exercise) 110/62 128/64 136/56 138/62 --   Blood Pressure (Exit) 100/72 104/66 84/50 110/62 116/62   Heart Rate (Admit) 83 bpm 79 bpm 77 bpm 92 bpm 87 bpm   Heart Rate (Exercise) 104 bpm 100 bpm 108 bpm 111 bpm 111 bpm   Heart Rate (Exit) 90 bpm 91 bpm 88 bpm 89 bpm 101 bpm   Oxygen Saturation (Admit) 98 % -- -- -- --   Oxygen Saturation (Exercise) 97 % -- -- -- --   Oxygen Saturation (Exit) 97 % -- -- -- --    Rating of Perceived Exertion (Exercise) 9 11 13 13 15    Perceived Dyspnea (Exercise) 0 -- -- 0 0   Symptoms Back pain -- -- none none   Comments Results -- -- -- --   Duration -- -- Continue with 30 min of aerobic exercise without signs/symptoms of physical distress. Continue with 30 min of aerobic exercise without signs/symptoms of physical distress. Continue with 30 min of aerobic exercise without signs/symptoms of physical distress.   Intensity -- -- THRR unchanged THRR unchanged THRR unchanged     Progression   Progression -- -- Continue to progress workloads to maintain intensity without signs/symptoms of physical distress. Continue to progress workloads to maintain intensity without signs/symptoms of physical distress. Continue to progress workloads to maintain intensity without signs/symptoms of physical distress.   Average METs -- -- 2.7 3.4 3.26     Resistance Training   Training Prescription -- Yes Yes Yes Yes   Weight -- 5lbs 5 lb 5 lb 5 lb   Reps -- 10-15 10-15 10-15 10-15     Interval Training   Interval Training -- -- No No No     Treadmill   MPH -- -- 1.4 -- --   Grade -- -- 0 -- --   Minutes -- -- 15 -- --   METs -- -- 2.07 -- --     Recumbant Bike   Level -- 3 10 11 11    RPM --  50 -- 50 50   Minutes -- 15 15 15 15    METs -- -- -- 3.85 3.85     NuStep   Level -- 2 -- 1  T6 7  T6   SPM -- 80 -- -- --   Minutes -- 15 -- 15 15   METs -- -- -- 2.4 3.1     REL-XR   Level -- -- -- 9 7   Minutes -- -- -- 15 15   METs -- -- -- -- 4     T5 Nustep   Level -- 2 -- -- --   SPM -- 80 -- -- --   Minutes -- 15 -- -- --     Biostep-RELP   Level -- -- -- -- 4   Minutes -- -- -- -- 15   METs -- -- -- -- 3     Oxygen   Maintain Oxygen Saturation -- -- 88% or higher 88% or higher 88% or higher    Row Name 12/19/22 1600 01/02/23 1500 01/16/23 1500         Response to Exercise   Blood Pressure (Admit) 122/62 106/60 118/62     Blood Pressure (Exit) 112/64  110/64 114/70     Heart Rate (Admit) 81 bpm 86 bpm 91 bpm     Heart Rate (Exercise) 115 bpm 119 bpm 119 bpm     Heart Rate (Exit) 100 bpm 100 bpm 83 bpm     Rating of Perceived Exertion (Exercise) 15 15 15      Perceived Dyspnea (Exercise) 0 0 --     Symptoms none none none     Duration Continue with 30 min of aerobic exercise without signs/symptoms of physical distress. Continue with 30 min of aerobic exercise without signs/symptoms of physical distress. Continue with 30 min of aerobic exercise without signs/symptoms of physical distress.     Intensity THRR unchanged THRR unchanged THRR unchanged       Progression   Progression Continue to progress workloads to maintain intensity without signs/symptoms of physical distress. Continue to progress workloads to maintain intensity without signs/symptoms of physical distress. Continue to progress workloads to maintain intensity without signs/symptoms of physical distress.     Average METs 3.5 4.24 4.18       Resistance Training   Training Prescription Yes Yes Yes     Weight 5 lb 5 lb 5 lb     Reps 10-15 10-15 10-15       Interval Training   Interval Training No No No       Recumbant Bike   Level 12  30 watt 13 13     Watts -- -- 60     Minutes 15 15 15      METs 3.09 4.25 4.17       NuStep   Level 5 7 7      Minutes 15 15 15      METs 3.7 5.1 3.5       REL-XR   Level 8 8 --     Minutes 15 15 --     METs 5.2 -- --       T5 Nustep   Level 7  T6 -- --     Minutes 15 -- --     METs 3.8 -- --       Biostep-RELP   Level 2 -- --     Minutes 15 -- --     METs 2 -- --  Oxygen   Maintain Oxygen Saturation 88% or higher 88% or higher 88% or higher              Exercise Comments:   Exercise Comments     Row Name 10/15/22 1131           Exercise Comments First full day of exercise!  Patient was oriented to gym and equipment including functions, settings, policies, and procedures.  Patient's individual exercise  prescription and treatment plan were reviewed.  All starting workloads were established based on the results of the 6 minute walk test done at initial orientation visit.  The plan for exercise progression was also introduced and progression will be customized based on patient's performance and goals.                Exercise Goals and Review:   Exercise Goals     Row Name 10/14/22 1647             Exercise Goals   Increase Physical Activity Yes       Intervention Provide advice, education, support and counseling about physical activity/exercise needs.;Develop an individualized exercise prescription for aerobic and resistive training based on initial evaluation findings, risk stratification, comorbidities and participant's personal goals.       Expected Outcomes Short Term: Attend rehab on a regular basis to increase amount of physical activity.;Long Term: Exercising regularly at least 3-5 days a week.;Long Term: Add in home exercise to make exercise part of routine and to increase amount of physical activity.       Increase Strength and Stamina Yes       Intervention Provide advice, education, support and counseling about physical activity/exercise needs.;Develop an individualized exercise prescription for aerobic and resistive training based on initial evaluation findings, risk stratification, comorbidities and participant's personal goals.       Expected Outcomes Short Term: Increase workloads from initial exercise prescription for resistance, speed, and METs.;Short Term: Perform resistance training exercises routinely during rehab and add in resistance training at home;Long Term: Improve cardiorespiratory fitness, muscular endurance and strength as measured by increased METs and functional capacity ( )       Able to understand and use rate of perceived exertion (RPE) scale Yes       Intervention Provide education and explanation on how to use RPE scale       Expected Outcomes Short  Term: Able to use RPE daily in rehab to express subjective intensity level;Long Term:  Able to use RPE to guide intensity level when exercising independently       Able to understand and use Dyspnea scale Yes       Intervention Provide education and explanation on how to use Dyspnea scale       Expected Outcomes Short Term: Able to use Dyspnea scale daily in rehab to express subjective sense of shortness of breath during exertion;Long Term: Able to use Dyspnea scale to guide intensity level when exercising independently       Knowledge and understanding of Target Heart Rate Range (THRR) Yes       Intervention Provide education and explanation of THRR including how the numbers were predicted and where they are located for reference       Expected Outcomes Short Term: Able to state/look up THRR;Long Term: Able to use THRR to govern intensity when exercising independently;Short Term: Able to use daily as guideline for intensity in rehab       Able to check pulse independently Yes  Intervention Provide education and demonstration on how to check pulse in carotid and radial arteries.;Review the importance of being able to check your own pulse for safety during independent exercise       Expected Outcomes Short Term: Able to explain why pulse checking is important during independent exercise;Long Term: Able to check pulse independently and accurately       Understanding of Exercise Prescription Yes       Intervention Provide education, explanation, and written materials on patient's individual exercise prescription       Expected Outcomes Short Term: Able to explain program exercise prescription;Long Term: Able to explain home exercise prescription to exercise independently                Exercise Goals Re-Evaluation :  Exercise Goals Re-Evaluation     Row Name 10/15/22 1131 10/22/22 1132 11/05/22 1346 11/19/22 1346 12/05/22 1522     Exercise Goal Re-Evaluation   Exercise Goals Review Able  to understand and use rate of perceived exertion (RPE) scale;Able to understand and use Dyspnea scale;Understanding of Exercise Prescription Increase Physical Activity;Understanding of Exercise Prescription;Increase Strength and Stamina Increase Physical Activity;Understanding of Exercise Prescription;Increase Strength and Stamina Increase Physical Activity;Understanding of Exercise Prescription;Increase Strength and Stamina Increase Physical Activity;Understanding of Exercise Prescription;Increase Strength and Stamina   Comments Reviewed RPE  and dyspnea scale, THR and program prescription with pt today.  Pt voiced understanding and was given a copy of goals to take home. Sathvik is off to a good start in the program. He has increased the bike workload. We will continue to monitor his progress in the program Elijah Birk is doing well in rehab. He began walking the treadmill at a speed of 1.4 mph with no incline and did well. He also improved to level 10 on the recumbent bike, level 3 on the T4 nustep, and level 6 on the XR. We will continue to monitor his progress in the program. Elijah Birk is doing well in rehab. He increased his XR level from 6 to 9, and he also increased his level on the recumbent bike from 10 to 11. We will continue to monitor his progress in the program. Elijah Birk continues to do well in rehab. He was recently able to increase his level on the T6 nustep from 1 to 7. He has also been able to maintain an intensity of level 11 on the recumbent bike. We will continue to monitor his progress in the program.   Expected Outcomes Short: Use RPE daily to regulate intensity. Long: Follow program prescription in THR. Short: Continue to follow current exercise prescription and progressivley increase bike workload. Long: Continue exercise to improve strength and stamina. Short: Continue to progressively increase treadmill workload. Long: Continue exercise to improve strength and stamina. Short: Continue to follow current  exercise prescription, and progressively increase workloads. Long: Continue exercise to improve strength and stamina. Short: Continue to follow current exercise prescription, and progressively increase workloads. Long: Continue exercise to improve strength and stamina.    Row Name 12/19/22 1636 01/02/23 1519 01/09/23 1153 01/16/23 1537       Exercise Goal Re-Evaluation   Exercise Goals Review Increase Physical Activity;Understanding of Exercise Prescription;Increase Strength and Stamina Increase Physical Activity;Understanding of Exercise Prescription;Increase Strength and Stamina Increase Physical Activity;Increase Strength and Stamina Increase Physical Activity;Increase Strength and Stamina;Understanding of Exercise Prescription    Comments Elijah Birk continues to do well in rehab. He was recently able to increase his level on the recumbent bike from level 11 to 12.  He has also been able to maintain his intensity on the T6. We will continue to monitor his progress in the program. Elijah Birk continues to do well in rehab. He was able to increase his level on the recumbent bike from 12 to 13. He was also able to increase his level on the T4 nustep from level 5 to 7. We will continue to monitor his progress in the program. Hyder states he is exercising at the gym 4 days that he is not at program Elijah Birk continues to do well in the program. He has continued to consistently work at level 7 on the T4 nustep and level 13 on the recumbent bike. He also is due for his post and will look to improve on it. We will continue to monitor his progress in the program.    Expected Outcomes Short: Continue to follow current exercise prescription, and progressively increase workloads. Long: Continue exercise to improve strength and stamina. Short: Continue to follow current exercise prescription, and progressively increase workloads. Long: Continue exercise to improve strength and stamina. Short: Continue to follow current exercise  prescription, and progressively increase workloads. Long: Continue exercise to improve strength and stamina. Short: Improve on post . Long: Continue exercise to improve strength and stamina.             Discharge Exercise Prescription (Final Exercise Prescription Changes):  Exercise Prescription Changes - 01/16/23 1500       Response to Exercise   Blood Pressure (Admit) 118/62    Blood Pressure (Exit) 114/70    Heart Rate (Admit) 91 bpm    Heart Rate (Exercise) 119 bpm    Heart Rate (Exit) 83 bpm    Rating of Perceived Exertion (Exercise) 15    Symptoms none    Duration Continue with 30 min of aerobic exercise without signs/symptoms of physical distress.    Intensity THRR unchanged      Progression   Progression Continue to progress workloads to maintain intensity without signs/symptoms of physical distress.    Average METs 4.18      Resistance Training   Training Prescription Yes    Weight 5 lb    Reps 10-15      Interval Training   Interval Training No      Recumbant Bike   Level 13    Watts 60    Minutes 15    METs 4.17      NuStep   Level 7    Minutes 15    METs 3.5      Oxygen   Maintain Oxygen Saturation 88% or higher             Nutrition:  Target Goals: Understanding of nutrition guidelines, daily intake of sodium 1500mg , cholesterol 200mg , calories 30% from fat and 7% or less from saturated fats, daily to have 5 or more servings of fruits and vegetables.  Education: All About Nutrition: -Group instruction provided by verbal, written material, interactive activities, discussions, models, and posters to present general guidelines for heart healthy nutrition including fat, fiber, MyPlate, the role of sodium in heart healthy nutrition, utilization of the nutrition label, and utilization of this knowledge for meal planning. Follow up email sent as well. Written material given at graduation. Flowsheet Row Cardiac Rehab from 11/14/2022 in [facility]  Cardiac and Pulmonary Rehab  Education need identified 10/14/22       Biometrics:  Pre Biometrics - 10/14/22 1647       Pre Biometrics   Height 5'  11.5" (1.816 m)    Weight 184 lb 14.4 oz (83.9 kg)    Waist Circumference 40.5 inches    Hip Circumference 40.5 inches    Waist to Hip Ratio 1 %    BMI (Calculated) 25.43    Single Leg Stand 2 seconds              Nutrition Therapy Plan and Nutrition Goals:  Nutrition Therapy & Goals - 10/14/22 1650       Nutrition Therapy   RD appointment deferred Yes      Intervention Plan   Intervention Prescribe, educate and counsel regarding individualized specific dietary modifications aiming towards targeted core components such as weight, hypertension, lipid management, diabetes, heart failure and other comorbidities.    Expected Outcomes Short Term Goal: A plan has been developed with personal nutrition goals set during dietitian appointment.;Long Term Goal: Adherence to prescribed nutrition plan.;Short Term Goal: Understand basic principles of dietary content, such as calories, fat, sodium, cholesterol and nutrients.             Nutrition Assessments:  MEDIFICTS Score Key: >=70 Need to make dietary changes  40-70 Heart Healthy Diet <= 40 Therapeutic Level Cholesterol Diet  Flowsheet Row Cardiac Rehab from 10/14/2022 in [facility] Cardiac and Pulmonary Rehab  Picture Your Plate Total Score on Admission 76      Picture Your Plate Scores: <56 Unhealthy dietary pattern with much room for improvement. 41-50 Dietary pattern unlikely to meet recommendations for good health and room for improvement. 51-60 More healthful dietary pattern, with some room for improvement.  >60 Healthy dietary pattern, although there may be some specific behaviors that could be improved.    Nutrition Goals Re-Evaluation:  Nutrition Goals Re-Evaluation     Row Name 01/09/23 1148             Goals   Nutrition Goal Maisie Fus deferred RD eval. He is a  dialysis patient and has an RD and team working with him.                Nutrition Goals Discharge (Final Nutrition Goals Re-Evaluation):  Nutrition Goals Re-Evaluation - 01/09/23 1148       Goals   Nutrition Goal Maisie Fus deferred RD eval. 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             Psychosocial: Target Goals: Acknowledge presence or absence of significant depression and/or stress, maximize coping skills, provide positive support system. Participant is able to verbalize types and ability to use techniques and skills needed for reducing stress and depression.   Education: Stress, Anxiety, and Depression - Group verbal and visual presentation to define topics covered.  Reviews how body is impacted by stress, anxiety, and depression.  Also discusses healthy ways to reduce stress and to treat/manage anxiety and depression.  Written material given at graduation.   Education: Sleep Hygiene -Provides group verbal and written instruction about how sleep can affect your health.  Define sleep hygiene, discuss sleep cycles and impact of sleep habits. Review good sleep hygiene tips.    Initial Review & Psychosocial Screening:  Initial Psych Review & Screening - 10/10/22 1111       Initial Review   Current issues with None Identified      Family Dynamics   Good Support System? Yes    Comments He can look to his wife for support. Now that his health is back under control he does not have  any stressors or deprression.      Barriers   Psychosocial barriers to participate in program The patient should benefit from training in stress management and relaxation.;There are no identifiable barriers or psychosocial needs.      Screening Interventions   Interventions Encouraged to exercise;To provide support and resources with identified psychosocial needs;Provide feedback about the scores to participant    Expected Outcomes Short Term goal: Utilizing  psychosocial counselor, staff and physician to assist with identification of specific Stressors or current issues interfering with healing process. Setting desired goal for each stressor or current issue identified.;Long Term Goal: Stressors or current issues are controlled or eliminated.;Short Term goal: Identification and review with participant of any Quality of Life or Depression concerns found by scoring the questionnaire.;Long Term goal: The participant improves quality of Life and PHQ9 Scores as seen by post scores and/or verbalization of changes             Quality of Life Scores:   Quality of Life - 10/14/22 1652       Quality of Life   Select Quality of Life      Quality of Life Scores   Health/Function Pre 19.12 %    Socioeconomic Pre 27.92 %    Psych/Spiritual Pre 28.07 %    Family Pre 30 %    GLOBAL Pre 24.6 %            Scores of 19 and below usually indicate a poorer quality of life in these areas.  A difference of  2-3 points is a clinically meaningful difference.  A difference of 2-3 points in the total score of the Quality of Life Index has been associated with significant improvement in overall quality of life, self-image, physical symptoms, and general health in studies assessing change in quality of life.  PHQ-9: Review Flowsheet  More data exists      10/14/2022 10/11/2022 07/03/2022 10/10/2021 10/09/2021  Depression screen PHQ 2/9  Decreased Interest 0 0 0 0 0 0  Down, Depressed, Hopeless 0 0 0 0 0 0  PHQ - 2 Score 0 0 0 0 0 0  Altered sleeping 1 0 - - 0  Tired, decreased energy 0 0 - - 0  Change in appetite 0 0 - - 0  Feeling bad or failure about yourself  0 0 - - 0  Trouble concentrating 0 1 - - 0  Moving slowly or fidgety/restless 0 0 - - 0  Suicidal thoughts 0 0 - - 0  PHQ-9 Score 1 1 - - 0  Difficult doing work/chores - Not difficult at all - - Not difficult at all    Details       Multiple values from one day are sorted in reverse-chronological  order        Interpretation of Total Score  Total Score Depression Severity:  1-4 = Minimal depression, 5-9 = Mild depression, 10-14 = Moderate depression, 15-19 = Moderately severe depression, 20-27 = Severe depression   Psychosocial Evaluation and Intervention:  Psychosocial Evaluation - 10/10/22 1112       Psychosocial Evaluation & Interventions   Interventions Encouraged to exercise with the program and follow exercise prescription;Relaxation education;Stress management education    Comments He can look to his wife for support. Now that his health is back under control he does not have any stressors or deprression.    Expected Outcomes Short: Start [facility] to help with mood. Long: Maintain a healthy mental state  Continue Psychosocial Services  Follow up required by staff             Psychosocial Re-Evaluation:  Psychosocial Re-Evaluation     Row Name 01/09/23 1145             Psychosocial Re-Evaluation   Current issues with None Identified       Comments No issues reported by Maisie Fus. He is doing well and has support at home       Expected Outcomes STG Oli remains stress/anxiety free. LTG He is able to manage any stress in his life       Interventions Encouraged to attend Cardiac Rehabilitation for the exercise       Continue Psychosocial Services  Follow up required by staff                Psychosocial Discharge (Final Psychosocial Re-Evaluation):  Psychosocial Re-Evaluation - 01/09/23 1145       Psychosocial Re-Evaluation   Current issues with None Identified    Comments No issues reported by Maisie Fus. He is doing well and has support at home    Expected Outcomes STG Kaylyn remains stress/anxiety free. LTG He is able to manage any stress in his life    Interventions Encouraged to attend Cardiac Rehabilitation for the exercise    Continue Psychosocial Services  Follow up required by staff             Vocational Rehabilitation: Provide  vocational rehab assistance to qualifying candidates.   Vocational Rehab Evaluation & Intervention:  Vocational Rehab - 01/09/23 1150       Initial Vocational Rehab Evaluation & Intervention   Assessment shows need for Vocational Rehabilitation No      Vocational Rehab Re-Evaulation   Comments retired             Education: Education Goals: Education classes will be provided on a variety of topics geared toward better understanding of heart health and risk factor modification. Participant will state understanding/return demonstration of topics presented as noted by education test scores.  Learning Barriers/Preferences:  Learning Barriers/Preferences - 10/10/22 1110       Learning Barriers/Preferences   Learning Barriers None    Learning Preferences None             General Cardiac Education Topics:  AED/CPR: - Group verbal and written instruction with the use of models to demonstrate the basic use of the AED with the basic ABC's of resuscitation.   Anatomy and Cardiac Procedures: - Group verbal and visual presentation and models provide information about basic cardiac anatomy and function. Reviews the testing methods done to diagnose heart disease and the outcomes of the test results. Describes the treatment choices: Medical Management, Angioplasty, or Coronary Bypass Surgery for treating various heart conditions including Myocardial Infarction, Angina, Valve Disease, and Cardiac Arrhythmias.  Written material given at graduation. Flowsheet Row Cardiac Rehab from 11/14/2022 in [facility] Cardiac and Pulmonary Rehab  Education need identified 10/14/22  Date 11/14/22  Educator SB  Instruction Review Code 1- Verbalizes Understanding       Medication Safety: - Group verbal and visual instruction to review commonly prescribed medications for heart and lung disease. Reviews the medication, class of the drug, and side effects. Includes the steps to properly store meds and  maintain the prescription regimen.  Written material given at graduation.   Intimacy: - Group verbal instruction through game format to discuss how heart and lung disease can affect sexual intimacy. Written material given at  graduation.. Flowsheet Row Cardiac Rehab from 11/14/2022 in [facility] Cardiac and Pulmonary Rehab  Date 10/17/22  Educator NT  Instruction Review Code 1- Verbalizes Understanding       Know Your Numbers and Heart Failure: - Group verbal and visual instruction to discuss disease risk factors for cardiac and pulmonary disease and treatment options.  Reviews associated critical values for Overweight/Obesity, Hypertension, Cholesterol, and Diabetes.  Discusses basics of heart failure: signs/symptoms and treatments.  Introduces Heart Failure Zone chart for action plan for heart failure.  Written material given at graduation. Flowsheet Row Cardiac Rehab from 11/14/2022 in [facility] Cardiac and Pulmonary Rehab  Education need identified 10/14/22       Infection Prevention: - Provides verbal and written material to individual with discussion of infection control including proper hand washing and proper equipment cleaning during exercise session. Flowsheet Row Cardiac Rehab from 11/14/2022 in [facility] Cardiac and Pulmonary Rehab  Date 10/14/22  Educator MB  Instruction Review Code 1- Verbalizes Understanding       Falls Prevention: - Provides verbal and written material to individual with discussion of falls prevention and safety. Flowsheet Row Cardiac Rehab from 11/14/2022 in [facility] Cardiac and Pulmonary Rehab  Date 10/14/22  Educator MB  Instruction Review Code 1- Verbalizes Understanding       Other: -Provides group and verbal instruction on various topics (see comments)   Knowledge Questionnaire Score:  Knowledge Questionnaire Score - 10/14/22 1654       Knowledge Questionnaire Score   Pre Score 20/26             Core Components/Risk Factors/Patient Goals at  Admission:  Personal Goals and Risk Factors at Admission - 10/14/22 1658       Core Components/Risk Factors/Patient Goals on Admission    Weight Management Yes;Weight Maintenance    Intervention Weight Management: Develop a combined nutrition and exercise program designed to reach desired caloric intake, while maintaining appropriate intake of nutrient and fiber, sodium and fats, and appropriate energy expenditure required for the weight goal.;Weight Management: Provide education and appropriate resources to help participant work on and attain dietary goals.;Weight Management/Obesity: Establish reasonable short term and long term weight goals.    Admit Weight 184 lb 14.4 oz (83.9 kg)    Goal Weight: Short Term 184 lb (83.5 kg)    Goal Weight: Long Term 184 lb (83.5 kg)    Expected Outcomes Short Term: Continue to assess and modify interventions until short term weight is achieved;Long Term: Adherence to nutrition and physical activity/exercise program aimed toward attainment of established weight goal;Weight Maintenance: Understanding of the daily nutrition guidelines, which includes 25-35% calories from fat, 7% or less cal from saturated fats, less than 200mg  cholesterol, less than 1.5gm of sodium, & 5 or more servings of fruits and vegetables daily;Understanding recommendations for meals to include 15-35% energy as protein, 25-35% energy from fat, 35-60% energy from carbohydrates, less than 200mg  of dietary cholesterol, 20-35 gm of total fiber daily;Understanding of distribution of calorie intake throughout the day with the consumption of 4-5 meals/snacks    Heart Failure Yes    Intervention Provide a combined exercise and nutrition program that is supplemented with education, support and counseling about heart failure. Directed toward relieving symptoms such as shortness of breath, decreased exercise tolerance, and extremity edema.    Expected Outcomes Improve functional capacity of life;Short term:  Attendance in program 2-3 days a week with increased exercise capacity. Reported lower sodium intake. Reported increased fruit and vegetable intake. Reports medication  compliance.;Short term: Daily weights obtained and reported for increase. Utilizing diuretic protocols set by physician.;Long term: Adoption of self-care skills and reduction of barriers for early signs and symptoms recognition and intervention leading to self-care maintenance.    Lipids Yes    Intervention Provide education and support for participant on nutrition & aerobic/resistive exercise along with prescribed medications to achieve LDL 70mg , HDL >40mg .    Expected Outcomes Short Term: Participant states understanding of desired cholesterol values and is compliant with medications prescribed. Participant is following exercise prescription and nutrition guidelines.;Long Term: Cholesterol controlled with medications as prescribed, with individualized exercise RX and with personalized nutrition plan. Value goals: LDL < 70mg , HDL > 40 mg.             Education:Diabetes - Individual verbal and written instruction to review signs/symptoms of diabetes, desired ranges of glucose level fasting, after meals and with exercise. Acknowledge that pre and post exercise glucose checks will be done for 3 sessions at entry of program.   Core Components/Risk Factors/Patient Goals Review:   Goals and Risk Factor Review     Row Name 01/09/23 1150             Core Components/Risk Factors/Patient Goals Review   Personal Goals Review Weight Management/Obesity;Heart Failure;Lipids       Review Duvid has kept his weight in the range he prefers. He works with his dialysis team to watch his weight. He is taking his prescribed meds and is working with a RD to manage his health. HE stated he is eating fruit and vegs as told. HIs last visit with nephrologist: was tole his kidneys are stable and all was excellent       Expected Outcomes STG continue  to work with dialysis team on weight and risk factor control while in program. LTG Maintian his nutrition, meds and exercise to keep healthy                Core Components/Risk Factors/Patient Goals at Discharge (Final Review):   Goals and Risk Factor Review - 01/09/23 1150       Core Components/Risk Factors/Patient Goals Review   Personal Goals Review Weight Management/Obesity;Heart Failure;Lipids    Review Khadar has kept his weight in the range he prefers. He works with his dialysis team to watch his weight. He is taking his prescribed meds and is working with a RD to manage his health. HE stated he is eating fruit and vegs as told. HIs last visit with nephrologist: was tole his kidneys are stable and all was excellent    Expected Outcomes STG continue to work with dialysis team on weight and risk factor control while in program. LTG Maintian his nutrition, meds and exercise to keep healthy             ITP Comments:  ITP Comments     Row Name 10/10/22 1110 10/14/22 1611 10/14/22 1705 10/15/22 1131 11/06/22 0913   ITP Comments Virtual Visit completed. Patient informed on EP and RD appointment and 6 Minute walk test. Patient also informed of patient health questionnaires on My Chart. Patient Verbalizes understanding. Visit diagnosis [facility] in [facility]10[facility]. Completed and gym orientation. Initial ITP created and sent for review to Dr. Bethann Punches, Medical Director. Completed and gym orientation. Initial ITP created and sent for review to Dr. Bethann Punches, Medical Director. First full day of exercise!  Patient was oriented to gym and equipment including functions, settings, policies, and procedures.  Patient's  individual exercise prescription and treatment plan were reviewed.  All starting workloads were established based on the results of the 6 minute walk test done at initial orientation visit.  The plan for exercise progression was also introduced and progression will be  customized based on patient's performance and goals. 30 Day review completed. Medical Director ITP review done, changes made as directed, and signed approval by Medical Director.     new to program    Row Name 12/04/22 0944 01/01/23 0829 01/22/23 1119       ITP Comments 30 Day review completed. Medical Director ITP review done, changes made as directed, and signed approval by Medical Director. 30 Day review completed. Medical Director ITP review done, changes made as directed, and signed approval by Medical Director. 30 Day review completed. Medical Director ITP review done, changes made as directed, and signed approval by Medical Director.              Comments:

## 2023-01-23 ENCOUNTER — Ambulatory Visit: Payer: HMO | Admitting: Urology

## 2023-01-23 ENCOUNTER — Encounter: Payer: HMO | Admitting: *Deleted

## 2023-01-23 ENCOUNTER — Encounter: Payer: Self-pay | Admitting: Urology

## 2023-01-23 VITALS — Ht 71.5 in | Wt 192.2 lb

## 2023-01-23 VITALS — BP 103/68 | HR 86 | Ht 70.0 in | Wt 186.0 lb

## 2023-01-23 DIAGNOSIS — R3129 Other microscopic hematuria: Secondary | ICD-10-CM

## 2023-01-23 DIAGNOSIS — N200 Calculus of kidney: Secondary | ICD-10-CM | POA: Diagnosis not present

## 2023-01-23 DIAGNOSIS — Z48812 Encounter for surgical aftercare following surgery on the circulatory system: Secondary | ICD-10-CM | POA: Diagnosis not present

## 2023-01-23 DIAGNOSIS — R3121 Asymptomatic microscopic hematuria: Secondary | ICD-10-CM

## 2023-01-23 DIAGNOSIS — Z951 Presence of aortocoronary bypass graft: Secondary | ICD-10-CM

## 2023-01-23 NOTE — Progress Notes (Signed)
Cystoscopy Procedure Note:  Indication: Microscopic hematuria  After informed consent and discussion of the procedure and its risks, Rodney Hall. was positioned and prepped in the standard fashion. Cystoscopy was performed with a flexible cystoscope. The urethra, bladder neck and entire bladder was visualized in a standard fashion. The prostate was moderate in size. The ureteral orifices were visualized in their normal location and orientation.  Bladder mucosa grossly normal throughout  Imaging: CT with small nonobstructing stones, no other urologic abnormalities  Findings: Normal cystoscopy  Assessment and Plan: Keep 1 year follow-up for routine interstitial cystitis  Legrand Rams, MD 01/23/2023

## 2023-01-23 NOTE — Progress Notes (Signed)
Daily Session Note  Patient Details  Name: Rodney Hall. MRN: [id number] Date of Birth: 05-29-1945 Referring Provider:   Flowsheet Row Cardiac Rehab from 10/14/2022 in [facility] Cardiac and Pulmonary Rehab  Referring Provider Dr. Julien Nordmann       Encounter Date: 01/23/2023  Check In:  Session Check In - 01/23/23 1105       Check-In   Supervising physician immediately available to respond to emergencies See telemetry face sheet for immediately available ER MD    Location [facility]-Cardiac & Pulmonary Rehab    Staff Present Ronette Deter, BS, Exercise Physiologist;Joseph Downey, RCP,RRT,BSRT;Meredith [place], RN Atilano Median, RN, ADN    Virtual Visit No    Medication changes reported     No    Fall or balance concerns reported    No    Warm-up and Cool-down Performed on first and last piece of equipment    Resistance Training Performed Yes    VAD Patient? No    PAD/SET Patient? No      Pain Assessment   Currently in Pain? No/denies                Social History   Tobacco Use  Smoking Status Never   Passive exposure: Never  Smokeless Tobacco Never    Goals Met:  Independence with exercise equipment Exercise tolerated well No report of concerns or symptoms today Strength training completed today  Goals Unmet:  Not Applicable  Comments: Pt able to follow exercise prescription today without complaint.  Will continue to monitor for progression.   6 Minute Walk     Row Name 10/14/22 1612 01/23/23 1118       6 Minute Walk   Phase Initial Discharge    Distance 1090 feet 1370 feet    Distance % Change -- 25.7 %    Distance Feet Change -- 280 ft    Walk Time 6 minutes 6 minutes    # of Rest Breaks 0 0    MPH 2.06 2.59    METS 2.24 2.86    RPE 9 11    Perceived Dyspnea  0 0    VO2 Peak 7.82 10.03    Symptoms Yes (comment) Yes (comment)    Comments Back pain 3/10 Back pain 8/10    Resting HR 83 bpm 83 bpm    Resting BP 110/64 122/68    Resting Oxygen  Saturation  98 % 96 %    Exercise Oxygen Saturation  during 6 min walk 97 % 90 %    Max Ex. HR 104 bpm 113 bpm    Max Ex. BP 110/62 126/66    2 Minute Post BP 100/72 --              Dr. Bethann Punches is Medical Director for [facility] Cardiac Rehabilitation.  Dr. Vida Rigger is Medical Director for [facility] Pulmonary Rehabilitation.

## 2023-01-23 NOTE — Patient Instructions (Signed)
Discharge Patient Instructions  Patient Details  Name: Rodney Hall. MRN: [id number] Date of Birth: 1945/06/04 Referring Provider:  Malva Limes, MD   Number of Visits: 64  Reason for Discharge:  Patient reached a stable level of exercise. Patient independent in their exercise. Patient has met program and personal goals.  Diagnosis:  S/P CABG x 5  Discharge Exercise Prescription (Final Exercise Prescription Changes):  Exercise Prescription Changes - 01/16/23 1500       Response to Exercise   Blood Pressure (Admit) 118/62    Blood Pressure (Exit) 114/70    Heart Rate (Admit) 91 bpm    Heart Rate (Exercise) 119 bpm    Heart Rate (Exit) 83 bpm    Rating of Perceived Exertion (Exercise) 15    Symptoms none    Duration Continue with 30 min of aerobic exercise without signs/symptoms of physical distress.    Intensity THRR unchanged      Progression   Progression Continue to progress workloads to maintain intensity without signs/symptoms of physical distress.    Average METs 4.18      Resistance Training   Training Prescription Yes    Weight 5 lb    Reps 10-15      Interval Training   Interval Training No      Recumbant Bike   Level 13    Watts 60    Minutes 15    METs 4.17      NuStep   Level 7    Minutes 15    METs 3.5      Oxygen   Maintain Oxygen Saturation 88% or higher             Functional Capacity:  6 Minute Walk     Row Name 10/14/22 1612 01/23/23 1118       6 Minute Walk   Phase Initial Discharge    Distance 1090 feet 1370 feet    Distance % Change -- 25.7 %    Distance Feet Change -- 280 ft    Walk Time 6 minutes 6 minutes    # of Rest Breaks 0 0    MPH 2.06 2.59    METS 2.24 2.86    RPE 9 11    Perceived Dyspnea  0 0    VO2 Peak 7.82 10.03    Symptoms Yes (comment) Yes (comment)    Comments Back pain 3/10 Back pain 8/10    Resting HR 83 bpm 83 bpm    Resting BP 110/64 122/68    Resting Oxygen Saturation  98 % 96 %     Exercise Oxygen Saturation  during 6 min walk 97 % 90 %    Max Ex. HR 104 bpm 113 bpm    Max Ex. BP 110/62 126/66    2 Minute Post BP 100/72 --              Nutrition & Weight - Outcomes:  Pre Biometrics - 10/14/22 1647       Pre Biometrics   Height 5' 11.5" (1.816 m)    Weight 184 lb 14.4 oz (83.9 kg)    Waist Circumference 40.5 inches    Hip Circumference 40.5 inches    Waist to Hip Ratio 1 %    BMI (Calculated) 25.43    Single Leg Stand 2 seconds             Post Biometrics - 01/23/23 1120        Post  Biometrics   Height 5' 11.5" (1.816 m)    Weight 192 lb 3.2 oz (87.2 kg)    Waist Circumference 37 inches    Hip Circumference 39 inches    Waist to Hip Ratio 0.95 %    BMI (Calculated) 26.44    Single Leg Stand 5.63 seconds             Nutrition:  Nutrition Therapy & Goals - 10/14/22 1650       Nutrition Therapy   RD appointment deferred Yes      Intervention Plan   Intervention Prescribe, educate and counsel regarding individualized specific dietary modifications aiming towards targeted core components such as weight, hypertension, lipid management, diabetes, heart failure and other comorbidities.    Expected Outcomes Short Term Goal: A plan has been developed with personal nutrition goals set during dietitian appointment.;Long Term Goal: Adherence to prescribed nutrition plan.;Short Term Goal: Understand basic principles of dietary content, such as calories, fat, sodium, cholesterol and nutrients.

## 2023-01-28 ENCOUNTER — Encounter: Payer: HMO | Admitting: *Deleted

## 2023-01-28 DIAGNOSIS — Z951 Presence of aortocoronary bypass graft: Secondary | ICD-10-CM

## 2023-01-28 DIAGNOSIS — Z48812 Encounter for surgical aftercare following surgery on the circulatory system: Secondary | ICD-10-CM | POA: Diagnosis not present

## 2023-01-28 NOTE — Progress Notes (Signed)
Daily Session Note  Patient Details  Name: Rodney Hall. MRN: [id number] Date of Birth: 04-15-1945 Referring Provider:   Flowsheet Row Cardiac Rehab from 10/14/2022 in [facility] Cardiac and Pulmonary Rehab  Referring Provider Dr. Julien Nordmann       Encounter Date: 01/28/2023  Check In:  Session Check In - 01/28/23 1113       Check-In   Supervising physician immediately available to respond to emergencies See telemetry face sheet for immediately available ER MD    Location [facility]-Cardiac & Pulmonary Rehab    Staff Present Cora Collum, RN, BSN, CCRP;Meredith Jewel Baize, RN BSN;Jason Wallace Cullens, RDN, LDN;Maxon Conetta BS, , Exercise Physiologist    Virtual Visit No    Medication changes reported     No    Fall or balance concerns reported    No    Warm-up and Cool-down Performed on first and last piece of equipment    Resistance Training Performed Yes    VAD Patient? No    PAD/SET Patient? No      Pain Assessment   Currently in Pain? No/denies                Social History   Tobacco Use  Smoking Status Never   Passive exposure: Never  Smokeless Tobacco Never    Goals Met:  Independence with exercise equipment Exercise tolerated well No report of concerns or symptoms today  Goals Unmet:  Not Applicable  Comments: Pt able to follow exercise prescription today without complaint.  Will continue to monitor for progression.    Dr. Bethann Punches is Medical Director for [facility] Cardiac Rehabilitation.  Dr. Vida Rigger is Medical Director for [facility] Pulmonary Rehabilitation.

## 2023-02-04 DIAGNOSIS — M48061 Spinal stenosis, lumbar region without neurogenic claudication: Secondary | ICD-10-CM | POA: Diagnosis not present

## 2023-02-04 DIAGNOSIS — M4727 Other spondylosis with radiculopathy, lumbosacral region: Secondary | ICD-10-CM | POA: Diagnosis not present

## 2023-02-04 DIAGNOSIS — M5135 Other intervertebral disc degeneration, thoracolumbar region: Secondary | ICD-10-CM | POA: Diagnosis not present

## 2023-02-04 DIAGNOSIS — M5136 Other intervertebral disc degeneration, lumbar region with discogenic back pain only: Secondary | ICD-10-CM | POA: Diagnosis not present

## 2023-02-04 DIAGNOSIS — M5126 Other intervertebral disc displacement, lumbar region: Secondary | ICD-10-CM | POA: Diagnosis not present

## 2023-02-05 ENCOUNTER — Encounter: Payer: Self-pay | Admitting: Dermatology

## 2023-02-05 ENCOUNTER — Ambulatory Visit: Payer: HMO | Admitting: Dermatology

## 2023-02-05 DIAGNOSIS — Z1283 Encounter for screening for malignant neoplasm of skin: Secondary | ICD-10-CM

## 2023-02-05 DIAGNOSIS — L821 Other seborrheic keratosis: Secondary | ICD-10-CM | POA: Diagnosis not present

## 2023-02-05 DIAGNOSIS — Z8589 Personal history of malignant neoplasm of other organs and systems: Secondary | ICD-10-CM

## 2023-02-05 DIAGNOSIS — D692 Other nonthrombocytopenic purpura: Secondary | ICD-10-CM | POA: Diagnosis not present

## 2023-02-05 DIAGNOSIS — W908XXA Exposure to other nonionizing radiation, initial encounter: Secondary | ICD-10-CM

## 2023-02-05 DIAGNOSIS — L820 Inflamed seborrheic keratosis: Secondary | ICD-10-CM | POA: Diagnosis not present

## 2023-02-05 DIAGNOSIS — L918 Other hypertrophic disorders of the skin: Secondary | ICD-10-CM | POA: Diagnosis not present

## 2023-02-05 DIAGNOSIS — D229 Melanocytic nevi, unspecified: Secondary | ICD-10-CM

## 2023-02-05 DIAGNOSIS — L905 Scar conditions and fibrosis of skin: Secondary | ICD-10-CM

## 2023-02-05 DIAGNOSIS — Z85828 Personal history of other malignant neoplasm of skin: Secondary | ICD-10-CM | POA: Diagnosis not present

## 2023-02-05 DIAGNOSIS — L578 Other skin changes due to chronic exposure to nonionizing radiation: Secondary | ICD-10-CM | POA: Diagnosis not present

## 2023-02-05 DIAGNOSIS — L570 Actinic keratosis: Secondary | ICD-10-CM | POA: Diagnosis not present

## 2023-02-05 DIAGNOSIS — L814 Other melanin hyperpigmentation: Secondary | ICD-10-CM | POA: Diagnosis not present

## 2023-02-05 DIAGNOSIS — D1801 Hemangioma of skin and subcutaneous tissue: Secondary | ICD-10-CM

## 2023-02-05 NOTE — Patient Instructions (Signed)

## 2023-02-05 NOTE — Progress Notes (Signed)
Follow-Up Visit   Subjective  Rodney Hall. is a 77 y.o. male who presents for the following: Skin Cancer Screening and Full Body Skin Exam  The patient presents for Total-Body Skin Exam (TBSE) for skin cancer screening and mole check. The patient has spots, moles and lesions to be evaluated, some may be new or changing and the patient may have concern these could be cancer.  The following portions of the chart were reviewed this encounter and updated as appropriate: medications, allergies, medical history  Review of Systems:  No other skin or systemic complaints except as noted in HPI or Assessment and Plan.  Objective  Well appearing patient in no apparent distress; mood and affect are within normal limits.  A full examination was performed including scalp, head, eyes, ears, nose, lips, neck, chest, axillae, abdomen, back, buttocks, bilateral upper extremities, bilateral lower extremities, hands, feet, fingers, toes, fingernails, and toenails. All findings within normal limits unless otherwise noted below.   Relevant physical exam findings are noted in the Assessment and Plan.  R cheek x 1 Erythematous thin papules/macules with gritty scale.   R opening to ear canal x 1 Erythematous stuck-on, waxy papule or plaqueErythematous stuck-on, waxy papule or plaque    Assessment & Plan   SKIN CANCER SCREENING PERFORMED TODAY.  ACTINIC DAMAGE - Chronic condition, secondary to cumulative UV/sun exposure - diffuse scaly erythematous macules with underlying dyspigmentation - Recommend daily broad spectrum sunscreen SPF 30+ to sun-exposed areas, reapply every 2 hours as needed.  - Staying in the shade or wearing long sleeves, sun glasses (UVA+UVB protection) and wide brim hats (4-inch brim around the entire circumference of the hat) are also recommended for sun protection.  - Call for new or changing lesions.  LENTIGINES, SEBORRHEIC KERATOSES, HEMANGIOMAS - Benign normal skin  lesions - Benign-appearing - Call for any changes  MELANOCYTIC NEVI - Tan-brown and/or pink-flesh-colored symmetric macules and papules - Benign appearing on exam today - Observation - Call clinic for new or changing moles - Recommend daily use of broad spectrum spf 30+ sunscreen to sun-exposed areas.   HISTORY OF BASAL CELL CARCINOMA OF THE SKIN - No evidence of recurrence today - Recommend regular full body skin exams - Recommend daily broad spectrum sunscreen SPF 30+ to sun-exposed areas, reapply every 2 hours as needed.  - Call if any new or changing lesions are noted between office visits  HISTORY OF SQUAMOUS CELL CARCINOMA OF THE SKIN - No evidence of recurrence today - No lymphadenopathy - Recommend regular full body skin exams - Recommend daily broad spectrum sunscreen SPF 30+ to sun-exposed areas, reapply every 2 hours as needed.  - Call if any new or changing lesions are noted between office visits  Acrochordons (Skin Tags) - Fleshy, skin-colored pedunculated papules - Benign appearing.  - Observe. - If desired, they can be removed with an in office procedure that is not covered by insurance. - Please call the clinic if you notice any new or changing lesions.  Purpura - Chronic; persistent and recurrent.  Treatable, but not curable. - Violaceous macules and patches - Benign - Related to trauma, age, sun damage and/or use of blood thinners, chronic use of topical and/or oral steroids - Observe - Can use OTC arnica containing moisturizer such as Dermend Bruise Formula if desired - Call for worsening or other concerns'  SCAR - flank/side, from back surgery Exam: Dyspigmented smooth macule or patch. Benign-appearing.  Observation.  Call clinic for new or changing  lesions. Recommend daily broad spectrum sunscreen SPF 30+, reapply every 2 hours as needed. Treatment: Recommend Serica moisturizing scar formula cream every night or [employer] brand or Mederma silicone scar  sheet every night for the first year after a scar appears to help with scar remodeling if desired. Scars remodel on their own for a full year and will gradually improve in appearance over time.  AK (actinic keratosis) R cheek x 1  Actinic keratoses are precancerous spots that appear secondary to cumulative UV radiation exposure/sun exposure over time. They are chronic with expected duration over 1 year. A portion of actinic keratoses will progress to squamous cell carcinoma of the skin. It is not possible to reliably predict which spots will progress to skin cancer and so treatment is recommended to prevent development of skin cancer.  Recommend daily broad spectrum sunscreen SPF 30+ to sun-exposed areas, reapply every 2 hours as needed.  Recommend staying in the shade or wearing long sleeves, sun glasses (UVA+UVB protection) and wide brim hats (4-inch brim around the entire circumference of the hat). Call for new or changing lesions.   Destruction of lesion - R cheek x 1 Complexity: simple   Destruction method: cryotherapy   Informed consent: discussed and consent obtained   Timeout:  patient name, date of birth, surgical site, and procedure verified Lesion destroyed using liquid nitrogen: Yes   Region frozen until ice ball extended beyond lesion: Yes   Outcome: patient tolerated procedure well with no complications   Post-procedure details: wound care instructions given    Inflamed seborrheic keratosis R opening to ear canal x 1  Symptomatic, irritating, patient would like treated.   Destruction of lesion - R opening to ear canal x 1 Complexity: simple   Destruction method: cryotherapy   Informed consent: discussed and consent obtained   Timeout:  patient name, date of birth, surgical site, and procedure verified Lesion destroyed using liquid nitrogen: Yes   Region frozen until ice ball extended beyond lesion: Yes   Outcome: patient tolerated procedure well with no complications    Post-procedure details: wound care instructions given     Return in about 6 months (around 08/06/2023) for TBSE.  Maylene Roes, CMA, am acting as scribe for Armida Sans, MD .   Documentation: I have reviewed the above documentation for accuracy and completeness, and I agree with the above.  Armida Sans, MD

## 2023-02-06 ENCOUNTER — Ambulatory Visit: Payer: HMO | Admitting: Physician Assistant

## 2023-02-06 ENCOUNTER — Encounter: Payer: HMO | Attending: Cardiovascular Disease | Admitting: *Deleted

## 2023-02-06 DIAGNOSIS — I252 Old myocardial infarction: Secondary | ICD-10-CM | POA: Insufficient documentation

## 2023-02-06 DIAGNOSIS — Z951 Presence of aortocoronary bypass graft: Secondary | ICD-10-CM | POA: Insufficient documentation

## 2023-02-06 DIAGNOSIS — Z48812 Encounter for surgical aftercare following surgery on the circulatory system: Secondary | ICD-10-CM | POA: Insufficient documentation

## 2023-02-06 NOTE — Progress Notes (Signed)
Daily Session Note  Patient Details  Name: Rodney Hall. MRN: [id number] Date of Birth: August 12, 1945 Referring Provider:   Flowsheet Row Cardiac Rehab from 10/14/2022 in [facility] Cardiac and Pulmonary Rehab  Referring Provider Dr. Julien Nordmann       Encounter Date: 02/06/2023  Check In:  Session Check In - 02/06/23 1113       Check-In   Supervising physician immediately available to respond to emergencies See telemetry face sheet for immediately available ER MD    Location [facility]-Cardiac & Pulmonary Rehab    Staff Present Cora Collum, RN, BSN, CCRP;Meredith Jewel Baize, RN BSN;Joseph Dows, RCP,RRT,BSRT;Noah Elmer, [place], Exercise Physiologist    Virtual Visit No    Medication changes reported     No    Fall or balance concerns reported    No    Warm-up and Cool-down Performed on first and last piece of equipment    Resistance Training Performed Yes    VAD Patient? No    PAD/SET Patient? No      Pain Assessment   Currently in Pain? No/denies                Social History   Tobacco Use  Smoking Status Never   Passive exposure: Never  Smokeless Tobacco Never    Goals Met:  Independence with exercise equipment Exercise tolerated well No report of concerns or symptoms today  Goals Unmet:  Not Applicable  Comments: Pt able to follow exercise prescription today without complaint.  Will continue to monitor for progression.    Dr. Bethann Punches is Medical Director for [facility] Cardiac Rehabilitation.  Dr. Vida Rigger is Medical Director for [facility] Pulmonary Rehabilitation.

## 2023-02-07 ENCOUNTER — Telehealth: Payer: Self-pay | Admitting: Pharmacist Clinician (PhC)/ Clinical Pharmacy Specialist

## 2023-02-11 ENCOUNTER — Encounter: Payer: HMO | Admitting: *Deleted

## 2023-02-11 ENCOUNTER — Other Ambulatory Visit: Payer: Self-pay | Admitting: *Deleted

## 2023-02-11 DIAGNOSIS — I5022 Chronic systolic (congestive) heart failure: Secondary | ICD-10-CM

## 2023-02-11 DIAGNOSIS — Z951 Presence of aortocoronary bypass graft: Secondary | ICD-10-CM

## 2023-02-11 DIAGNOSIS — Z48812 Encounter for surgical aftercare following surgery on the circulatory system: Secondary | ICD-10-CM | POA: Diagnosis not present

## 2023-02-11 NOTE — Progress Notes (Signed)
Daily Session Note  Patient Details  Name: Rodney Hall. MRN: [id number] Date of Birth: [birth year] Referring Provider:   Flowsheet Row Cardiac Rehab from 10/14/2022 in [facility] Cardiac and Pulmonary Rehab  Referring Provider Dr. Julien Nordmann       Encounter Date: 02/11/2023  Check In:  Session Check In - 02/11/23 1107       Check-In   Supervising physician immediately available to respond to emergencies See telemetry face sheet for immediately available ER MD    Location [facility]-Cardiac & Pulmonary Rehab    Staff Present Maxon Suzzette Righter, , Exercise Physiologist;Meredith Jewel Baize, RN BSN;Jason Wallace Cullens, RDN, LDN;Chikita Dogan, RN, BSN, CCRP    Virtual Visit No    Medication changes reported     No    Fall or balance concerns reported    No    Warm-up and Cool-down Performed on first and last piece of equipment    Resistance Training Performed Yes    VAD Patient? No    PAD/SET Patient? No      Pain Assessment   Currently in Pain? No/denies                Social History   Tobacco Use  Smoking Status Never   Passive exposure: Never  Smokeless Tobacco Never    Goals Met:  Independence with exercise equipment Exercise tolerated well No report of concerns or symptoms today  Goals Unmet:  Not Applicable  Comments: Pt able to follow exercise prescription today without complaint.  Will continue to monitor for progression.    Dr. Bethann Punches is Medical Director for [facility] Cardiac Rehabilitation.  Dr. Vida Rigger is Medical Director for [facility] Pulmonary Rehabilitation.

## 2023-02-13 ENCOUNTER — Encounter: Payer: HMO | Admitting: *Deleted

## 2023-02-13 DIAGNOSIS — Z951 Presence of aortocoronary bypass graft: Secondary | ICD-10-CM

## 2023-02-13 DIAGNOSIS — Z48812 Encounter for surgical aftercare following surgery on the circulatory system: Secondary | ICD-10-CM | POA: Diagnosis not present

## 2023-02-13 NOTE — Progress Notes (Signed)
Daily Session Note  Patient Details  Name: Rodney Hall. MRN: [id number] Date of Birth: Feb 18, 1946 Referring Provider:   Flowsheet Row Cardiac Rehab from 10/14/2022 in [facility] Cardiac and Pulmonary Rehab  Referring Provider Dr. Julien Nordmann       Encounter Date: 02/13/2023  Check In:  Session Check In - 02/13/23 1053       Check-In   Supervising physician immediately available to respond to emergencies See telemetry face sheet for immediately available ER MD    Location [facility]-Cardiac & Pulmonary Rehab    Staff Present Ronette Deter, BS, Exercise Physiologist;Meredith Jewel Baize, RN BSN;Maxon Conetta BS, , Exercise Physiologist;Ellajane Stong Katrinka Blazing, RN, ADN    Virtual Visit No    Medication changes reported     No    Fall or balance concerns reported    No    Warm-up and Cool-down Performed on first and last piece of equipment    Resistance Training Performed Yes    VAD Patient? No    PAD/SET Patient? No      Pain Assessment   Currently in Pain? No/denies                Social History   Tobacco Use  Smoking Status Never   Passive exposure: Never  Smokeless Tobacco Never    Goals Met:  Independence with exercise equipment Exercise tolerated well No report of concerns or symptoms today Strength training completed today  Goals Unmet:  Not Applicable  Comments: Pt able to follow exercise prescription today without complaint.  Will continue to monitor for progression.    Dr. Bethann Punches is Medical Director for [facility] Cardiac Rehabilitation.  Dr. Vida Rigger is Medical Director for [facility] Pulmonary Rehabilitation.

## 2023-02-15 NOTE — Telephone Encounter (Signed)
error 

## 2023-02-18 ENCOUNTER — Ambulatory Visit: Payer: HMO

## 2023-02-18 DIAGNOSIS — Z6826 Body mass index (BMI) 26.0-26.9, adult: Secondary | ICD-10-CM | POA: Diagnosis not present

## 2023-02-18 DIAGNOSIS — M4727 Other spondylosis with radiculopathy, lumbosacral region: Secondary | ICD-10-CM | POA: Diagnosis not present

## 2023-02-19 ENCOUNTER — Encounter: Payer: Self-pay | Admitting: *Deleted

## 2023-02-19 DIAGNOSIS — Z951 Presence of aortocoronary bypass graft: Secondary | ICD-10-CM

## 2023-02-19 NOTE — Progress Notes (Signed)
Cardiac Individual Treatment Plan  Patient Details  Name: Rodney Hall. MRN: [id number] Date of Birth: 1945/09/08 Referring Provider:   Flowsheet Row Cardiac Rehab from 10/14/2022 in [facility] Cardiac and Pulmonary Rehab  Referring Provider Dr. Julien Nordmann       Initial Encounter Date:  Flowsheet Row Cardiac Rehab from 10/14/2022 in [facility] Cardiac and Pulmonary Rehab  Date 10/14/22       Visit Diagnosis: S/P CABG x 5  Patient's Home Medications on Admission:  Current Outpatient Medications:    acetaminophen (TYLENOL) 325 MG tablet, Take 650 mg by mouth every 6 (six) hours as needed for moderate pain or headache., Disp: , Rfl:    aspirin EC 81 MG tablet, Take 1 tablet (81 mg total) by mouth daily. Swallow whole., Disp: 30 tablet, Rfl: 12   atorvastatin (LIPITOR) 10 MG tablet, Take 1 tablet (10 mg total) by mouth daily., Disp: 90 tablet, Rfl: 3   clopidogrel (PLAVIX) 75 MG tablet, Take 1 tablet (75 mg total) by mouth daily., Disp: 90 tablet, Rfl: 3   imipramine (TOFRANIL) 50 MG tablet, Take 1 tablet (50 mg total) by mouth at bedtime., Disp: 90 tablet, Rfl: 3   tamsulosin (FLOMAX) 0.4 MG CAPS capsule, Take 1 capsule (0.4 mg total) by mouth daily after supper., Disp: 90 capsule, Rfl: 3  Past Medical History: Past Medical History:  Diagnosis Date   Abdominal mass    Abdominal pain 06/02/2017   Anxiety    Pt. denies   Arthritis    pt. 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343  7.363  7.377  7.322  7.357  7.428   PCO2 arterial 32 - 48 mmHg - - - - 37.9  35.8  38.9  39.3  37.8  46.0  38.3  33.7   Bicarbonate 20.0 - 28.0 mmol/L - - - - 20.8  20.3  21.1  22.4  22.2  24.2  23.8  21.5  22.3   TCO2 22 - 32 mmol/L - - - - 22  21  24  22  24  24  23  23  26  25  26  24  23  23    Acid-base deficit 0.0 - 2.0 mmol/L - - - - 5.0  5.0  4.0  3.0  3.0  2.0  2.0  4.0  2.0   O2 Saturation % - - - - 97  96  99  100  100  82  100  100  91     Details       Multiple values from one day are sorted in reverse-chronological order          Exercise Target Goals: Exercise Program Goal: Individual exercise prescription set using results from initial 6 min walk test and THRR while considering  patient's activity barriers and safety.   Exercise Prescription Goal: Initial exercise prescription builds to 30-45 minutes a day of aerobic activity, 2-3 days per week.  Home exercise guidelines will be given to patient during program as part of exercise prescription that the participant will acknowledge.   Education: Aerobic Exercise: - Group verbal and visual presentation on the components of exercise prescription. Introduces F.I.T.T principle from ACSM for exercise prescriptions.  Reviews F.I.T.T. principles of aerobic exercise including progression. Written material given at graduation. Flowsheet Row Cardiac Rehab from 11/14/2022 in [facility] Cardiac and Pulmonary Rehab  Education need identified 10/14/22  Date 10/17/22  Educator NT  Instruction Review Code 1- Verbalizes Understanding        Education: Resistance Exercise: - Group verbal and visual presentation on the components of exercise prescription. Introduces F.I.T.T principle from ACSM for exercise prescriptions  Reviews F.I.T.T. principles of resistance exercise including progression. Written material given at graduation. Flowsheet Row Cardiac Rehab from 11/14/2022 in [facility] Cardiac and Pulmonary Rehab  Education need identified 10/14/22  Date 10/24/22  Educator NT  Instruction Review Code 1- [employer] Understanding        Education: Exercise & Equipment Safety: - Individual verbal instruction and demonstration of equipment use and safety with use of the equipment. Flowsheet Row Cardiac Rehab from 11/14/2022 in [facility] Cardiac and Pulmonary Rehab  Date 10/14/22  Educator MB  Instruction Review Code 1- Verbalizes Understanding       Education: Exercise Physiology & General Exercise Guidelines: - Group verbal and written instruction with models to review the exercise physiology of the cardiovascular system and associated critical values. Provides general exercise guidelines with specific guidelines to those with heart or lung disease.  Flowsheet Row Cardiac Rehab from 11/14/2022 in [facility] Cardiac and Pulmonary Rehab  Education need identified 10/14/22       Education: Flexibility, Balance, Mind/Body Relaxation: - Group verbal and visual presentation with interactive activity on the components of exercise prescription. Introduces F.I.T.T principle from ACSM for exercise prescriptions. Reviews F.I.T.T. principles of flexibility and balance exercise training including progression. Also discusses the mind body connection.  Reviews various relaxation techniques to help reduce and manage stress (i.e. Deep breathing, progressive muscle relaxation, and visualization). Balance handout provided to take home. Written material given at graduation. Flowsheet Row Cardiac Rehab from 11/14/2022 in [facility] Cardiac and Pulmonary Rehab  Date  10/24/22  Educator NT  Instruction Review Code 1- Verbalizes Understanding       Activity Barriers & Risk Stratification:  Activity Barriers & Cardiac Risk Stratification - 10/14/22 1615       Activity Barriers & Cardiac Risk Stratification   Activity Barriers Back Problems;Balance Concerns;History of Falls;Other (comment)    Comments Dizziness    Cardiac Risk Stratification High             6 Minute Walk:  6 Minute Walk     Row Name 10/14/22 1612 01/23/23 1118       6 Minute Walk  Phase Initial Discharge    Distance 1090 feet 1370 feet    Distance % Change -- 25.7 %    Distance Feet Change -- 280 ft    Walk Time 6 minutes 6 minutes    # of Rest Breaks 0 0    MPH 2.06 2.59    METS 2.24 2.86    RPE 9 11    Perceived Dyspnea  0 0    VO2 Peak 7.82 10.03    Symptoms Yes (comment) Yes (comment)    Comments Back pain 3/10 Back pain 8/10    Resting HR 83 bpm 83 bpm    Resting BP 110/64 122/68    Resting Oxygen Saturation  98 % 96 %    Exercise Oxygen Saturation  during 6 min walk 97 % 90 %    Max Ex. HR 104 bpm 113 bpm    Max Ex. BP 110/62 126/66    2 Minute Post BP 100/72 --             Oxygen Initial Assessment:   Oxygen Re-Evaluation:   Oxygen Discharge (Final Oxygen Re-Evaluation):   Initial Exercise Prescription:  Initial Exercise Prescription - 01/02/23 1500       Recumbant Bike   Watts 62             Perform Capillary Blood Glucose checks as needed.  Exercise Prescription Changes:   Exercise Prescription Changes     Row Name 10/14/22 1600 10/22/22 1100 11/05/22 1300 11/19/22 1300 12/05/22 1500     Response to Exercise   Blood Pressure (Admit) 110/64 104/52 96/58 98/58  98/60   Blood Pressure (Exercise) 110/62 128/64 136/56 138/62 --   Blood Pressure (Exit) 100/72 104/66 84/50 110/62 116/62   Heart Rate (Admit) 83 bpm 79 bpm 77 bpm 92 bpm 87 bpm   Heart Rate (Exercise) 104 bpm 100 bpm 108 bpm 111 bpm 111 bpm   Heart Rate  (Exit) 90 bpm 91 bpm 88 bpm 89 bpm 101 bpm   Oxygen Saturation (Admit) 98 % -- -- -- --   Oxygen Saturation (Exercise) 97 % -- -- -- --   Oxygen Saturation (Exit) 97 % -- -- -- --   Rating of Perceived Exertion (Exercise) 9 11 13 13 15    Perceived Dyspnea (Exercise) 0 -- -- 0 0   Symptoms Back pain -- -- none none   Comments Results -- -- -- --   Duration -- -- Continue with 30 min of aerobic exercise without signs/symptoms of physical distress. Continue with 30 min of aerobic exercise without signs/symptoms of physical distress. Continue with 30 min of aerobic exercise without signs/symptoms of physical distress.   Intensity -- -- THRR unchanged THRR unchanged THRR unchanged     Progression   Progression -- -- Continue to progress workloads to maintain intensity without signs/symptoms of physical distress. Continue to progress workloads to maintain intensity without signs/symptoms of physical distress. Continue to progress workloads to maintain intensity without signs/symptoms of physical distress.   Average METs -- -- 2.7 3.4 3.26     Resistance Training   Training Prescription -- Yes Yes Yes Yes   Weight -- 5lbs 5 lb 5 lb 5 lb   Reps -- 10-15 10-15 10-15 10-15     Interval Training   Interval Training -- -- No No No     Treadmill   MPH -- -- 1.4 -- --   Grade -- -- 0 -- --   Minutes -- -- 15 -- --  METs -- -- 2.07 -- --     Recumbant Bike   Level -- 3 10 11 11    RPM -- 50 -- 50 50   Minutes -- 15 15 15 15    METs -- -- -- 3.85 3.85     NuStep   Level -- 2 -- 1  T6 7  T6   SPM -- 80 -- -- --   Minutes -- 15 -- 15 15   METs -- -- -- 2.4 3.1     REL-XR   Level -- -- -- 9 7   Minutes -- -- -- 15 15   METs -- -- -- -- 4     T5 Nustep   Level -- 2 -- -- --   SPM -- 80 -- -- --   Minutes -- 15 -- -- --     Biostep-RELP   Level -- -- -- -- 4   Minutes -- -- -- -- 15   METs -- -- -- -- 3     Oxygen   Maintain Oxygen Saturation -- -- 88% or higher 88% or higher  88% or higher    Row Name 12/19/22 1600 01/02/23 1500 01/16/23 1500 01/29/23 1500 02/06/23 1100     Response to Exercise   Blood Pressure (Admit) 122/62 106/60 118/62 122/68 --   Blood Pressure (Exercise) -- -- -- 126/66 --   Blood Pressure (Exit) 112/64 110/64 114/70 102/58 --   Heart Rate (Admit) 81 bpm 86 bpm 91 bpm 83 bpm --   Heart Rate (Exercise) 115 bpm 119 bpm 119 bpm 113 bpm --   Heart Rate (Exit) 100 bpm 100 bpm 83 bpm 82 bpm --   Oxygen Saturation (Admit) -- -- -- 96 % --   Oxygen Saturation (Exercise) -- -- -- 90 % --   Oxygen Saturation (Exit) -- -- -- 94 % --   Rating of Perceived Exertion (Exercise) 15 15 15 15  --   Perceived Dyspnea (Exercise) 0 0 -- 0 --   Symptoms none none none none --   Comments -- -- -- Post completed --   Duration Continue with 30 min of aerobic exercise without signs/symptoms of physical distress. Continue with 30 min of aerobic exercise without signs/symptoms of physical distress. Continue with 30 min of aerobic exercise without signs/symptoms of physical distress. Continue with 30 min of aerobic exercise without signs/symptoms of physical distress. --   Intensity THRR unchanged THRR unchanged THRR unchanged THRR unchanged --     Progression   Progression Continue to progress workloads to maintain intensity without signs/symptoms of physical distress. Continue to progress workloads to maintain intensity without signs/symptoms of physical distress. Continue to progress workloads to maintain intensity without signs/symptoms of physical distress. Continue to progress workloads to maintain intensity without signs/symptoms of physical distress. --   Average METs 3.5 4.24 4.18 3.98 --     Resistance Training   Training Prescription Yes Yes Yes Yes --   Weight 5 lb 5 lb 5 lb 5 lb --   Reps 10-15 10-15 10-15 10-15 --     Interval Training   Interval Training No No No No --     Recumbant Bike   Level 12  30 watt 13 13 12  --   Watts -- -- 60 61  --   Minutes 15 15 15 15  --   METs 3.09 4.25 4.17 4.2 --     NuStep   Level 5 7 7 6  --   Minutes  15 15 15 15  --   METs 3.7 5.1 3.5 4.1 --     REL-XR   Level 8 8 -- -- --   Minutes 15 15 -- -- --   METs 5.2 -- -- -- --     T5 Nustep   Level 7  T6 -- -- -- --   Minutes 15 -- -- -- --   METs 3.8 -- -- -- --     Biostep-RELP   Level 2 -- -- -- --   Minutes 15 -- -- -- --   METs 2 -- -- -- --     Home Exercise Plan   Plans to continue exercise at -- -- -- -- [employer] (comment)  Exercise at [employer], using recumbent bike   Frequency -- -- -- -- Add 3 additional days to program exercise sessions.   Initial Home Exercises Provided -- -- -- -- 02/06/23     Oxygen   Maintain Oxygen Saturation 88% or higher 88% or higher 88% or higher 88% or higher 88% or higher    Row Name 02/13/23 0900             Response to Exercise   Blood Pressure (Admit) 96/66       Blood Pressure (Exit) 98/56       Heart Rate (Admit) 84 bpm       Heart Rate (Exercise) 103 bpm       Heart Rate (Exit) 94 bpm       Rating of Perceived Exertion (Exercise) 15       Perceived Dyspnea (Exercise) 0       Symptoms none       Duration Continue with 30 min of aerobic exercise without signs/symptoms of physical distress.       Intensity THRR unchanged         Progression   Progression Continue to progress workloads to maintain intensity without signs/symptoms of physical distress.       Average METs 3.6         Resistance Training   Training Prescription Yes       Weight 5 lb       Reps 10-15         Interval Training   Interval Training No         Oxygen   Oxygen Continuous         Recumbant Bike   Level 12       Watts 61       Minutes 15       METs 4.21         NuStep   Level 6       Minutes 15       METs 3.6         T5 Nustep   Level 2       Minutes 15       METs 2.3         Home Exercise Plan   Plans to continue exercise at [employer] (comment)  Exercise at  [facility], using recumbent bike       Frequency Add 3 additional days to program exercise sessions.       Initial Home Exercises Provided 02/06/23         Oxygen   Maintain Oxygen Saturation 88% or higher                Exercise Comments:   Exercise Comments  Row Name 10/15/22 1131           Exercise Comments First full day of exercise!  Patient was oriented to gym and equipment including functions, settings, policies, and procedures.  Patient's individual exercise prescription and treatment plan were reviewed.  All starting workloads were established based on the results of the 6 minute walk test done at initial orientation visit.  The plan for exercise progression was also introduced and progression will be customized based on patient's performance and goals.                Exercise Goals and Review:   Exercise Goals     Row Name 10/14/22 1647             Exercise Goals   Increase Physical Activity Yes       Intervention Provide advice, education, support and counseling about physical activity/exercise needs.;Develop an individualized exercise prescription for aerobic and resistive training based on initial evaluation findings, risk stratification, comorbidities and participant's personal goals.       Expected Outcomes Short Term: Attend rehab on a regular basis to increase amount of physical activity.;Long Term: Exercising regularly at least 3-5 days a week.;Long Term: Add in home exercise to make exercise part of routine and to increase amount of physical activity.       Increase Strength and Stamina Yes       Intervention Provide advice, education, support and counseling about physical activity/exercise needs.;Develop an individualized exercise prescription for aerobic and resistive training based on initial evaluation findings, risk stratification, comorbidities and participant's personal goals.       Expected Outcomes Short Term: Increase workloads from  initial exercise prescription for resistance, speed, and METs.;Short Term: Perform resistance training exercises routinely during rehab and add in resistance training at home;Long Term: Improve cardiorespiratory fitness, muscular endurance and strength as measured by increased METs and functional capacity ( )       Able to understand and use rate of perceived exertion (RPE) scale Yes       Intervention Provide education and explanation on how to use RPE scale       Expected Outcomes Short Term: Able to use RPE daily in rehab to express subjective intensity level;Long Term:  Able to use RPE to guide intensity level when exercising independently       Able to understand and use Dyspnea scale Yes       Intervention Provide education and explanation on how to use Dyspnea scale       Expected Outcomes Short Term: Able to use Dyspnea scale daily in rehab to express subjective sense of shortness of breath during exertion;Long Term: Able to use Dyspnea scale to guide intensity level when exercising independently       Knowledge and understanding of Target Heart Rate Range (THRR) Yes       Intervention Provide education and explanation of THRR including how the numbers were predicted and where they are located for reference       Expected Outcomes Short Term: Able to state/look up THRR;Long Term: Able to use THRR to govern intensity when exercising independently;Short Term: Able to use daily as guideline for intensity in rehab       Able to check pulse independently Yes       Intervention Provide education and demonstration on how to check pulse in carotid and radial arteries.;Review the importance of being able to check your own pulse for safety during independent exercise  Expected Outcomes Short Term: Able to explain why pulse checking is important during independent exercise;Long Term: Able to check pulse independently and accurately       Understanding of Exercise Prescription Yes        Intervention Provide education, explanation, and written materials on patient's individual exercise prescription       Expected Outcomes Short Term: Able to explain program exercise prescription;Long Term: Able to explain home exercise prescription to exercise independently                Exercise Goals Re-Evaluation :  Exercise Goals Re-Evaluation     Row Name 10/15/22 1131 10/22/22 1132 11/05/22 1346 11/19/22 1346 12/05/22 1522     Exercise Goal Re-Evaluation   Exercise Goals Review Able to understand and use rate of perceived exertion (RPE) scale;Able to understand and use Dyspnea scale;Understanding of Exercise Prescription Increase Physical Activity;Understanding of Exercise Prescription;Increase Strength and Stamina Increase Physical Activity;Understanding of Exercise Prescription;Increase Strength and Stamina Increase Physical Activity;Understanding of Exercise Prescription;Increase Strength and Stamina Increase Physical Activity;Understanding of Exercise Prescription;Increase Strength and Stamina   Comments Reviewed RPE  and dyspnea scale, THR and program prescription with pt today.  Pt voiced understanding and was given a copy of goals to take home. Rodney Hall is off to a good start in the program. He has increased the bike workload. We will continue to monitor his progress in the program Rodney Hall is doing well in rehab. He began walking the treadmill at a speed of 1.4 mph with no incline and did well. He also improved to level 10 on the recumbent bike, level 3 on the T4 nustep, and level 6 on the XR. We will continue to monitor his progress in the program. Rodney Hall is doing well in rehab. He increased his XR level from 6 to 9, and he also increased his level on the recumbent bike from 10 to 11. We will continue to monitor his progress in the program. Rodney Hall continues to do well in rehab. He was recently able to increase his level on the T6 nustep from 1 to 7. He has also been able to maintain an intensity  of level 11 on the recumbent bike. We will continue to monitor his progress in the program.   Expected Outcomes Short: Use RPE daily to regulate intensity. Long: Follow program prescription in THR. Short: Continue to follow current exercise prescription and progressivley increase bike workload. Long: Continue exercise to improve strength and stamina. Short: Continue to progressively increase treadmill workload. Long: Continue exercise to improve strength and stamina. Short: Continue to follow current exercise prescription, and progressively increase workloads. Long: Continue exercise to improve strength and stamina. Short: Continue to follow current exercise prescription, and progressively increase workloads. Long: Continue exercise to improve strength and stamina.    Row Name 12/19/22 1636 01/02/23 1519 01/09/23 1153 01/16/23 1537 01/29/23 1532     Exercise Goal Re-Evaluation   Exercise Goals Review Increase Physical Activity;Understanding of Exercise Prescription;Increase Strength and Stamina Increase Physical Activity;Understanding of Exercise Prescription;Increase Strength and Stamina Increase Physical Activity;Increase Strength and Stamina Increase Physical Activity;Increase Strength and Stamina;Understanding of Exercise Prescription Increase Physical Activity;Increase Strength and Stamina;Understanding of Exercise Prescription   Comments Rodney Hall continues to do well in rehab. He was recently able to increase his level on the recumbent bike from level 11 to 12. He has also been able to maintain his intensity on the T6. We will continue to monitor his progress in the program. Rodney Hall continues to do well in  rehab. He was able to increase his level on the recumbent bike from 12 to 13. He was also able to increase his level on the T4 nustep from level 5 to 7. We will continue to monitor his progress in the program. Rodney Hall states he is exercising at the gym 4 days that he is not at program Rodney Hall continues to do well  in the program. He has continued to consistently work at level 7 on the T4 nustep and level 13 on the recumbent bike. He also is due for his post and will look to improve on it. We will continue to monitor his progress in the program. Rodney Hall continues to do well in rehab. He improved on his post by 25.7% and is due to graduate the program soon. He has been doing well with his current exercise prescription working at level 6 on the T4 nustep and level 12 on the recumbent bike. We will continue to monitor his progress in the program.   Expected Outcomes Short: Continue to follow current exercise prescription, and progressively increase workloads. Long: Continue exercise to improve strength and stamina. Short: Continue to follow current exercise prescription, and progressively increase workloads. Long: Continue exercise to improve strength and stamina. Short: Continue to follow current exercise prescription, and progressively increase workloads. Long: Continue exercise to improve strength and stamina. Short: Improve on post . Long: Continue exercise to improve strength and stamina. Short: Graduate. Long: Continue to exercise independently.    Row Name 02/06/23 1140 02/13/23 0948           Exercise Goal Re-Evaluation   Exercise Goals Review Increase Physical Activity;Able to understand and use Dyspnea scale;Understanding of Exercise Prescription;Knowledge and understanding of Target Heart Rate Range (THRR);Increase Strength and Stamina;Able to check pulse independently;Able to understand and use rate of perceived exertion (RPE) scale Increase Physical Activity;Increase Strength and Stamina;Understanding of Exercise Prescription      Comments Reviewed home exercise with pt today.  Pt plans to workout at [facility] six days a week using the recumbent bike for exercise.  Reviewed THR, pulse, RPE, sign and symptoms, pulse oximetery and when to call 911 or MD.  Also discussed weather considerations and  indoor options.  Pt voiced understanding. Rodney Hall continues to do well in rehab. He has been able to maintain his intensity on the recumbent bike at level 12, and the T4 nustep at level 6. We will continue to monitor his progress as he approaches graduation.      Expected Outcomes Short: Continue to use recumbent bike on days away from rehab. Long: Continue to exercise independently. Short: Graduate. Long: Continue exercise to improve strength and stamina.               Discharge Exercise Prescription (Final Exercise Prescription Changes):  Exercise Prescription Changes - 02/13/23 0900       Response to Exercise   Blood Pressure (Admit) 96/66    Blood Pressure (Exit) 98/56    Heart Rate (Admit) 84 bpm    Heart Rate (Exercise) 103 bpm    Heart Rate (Exit) 94 bpm    Rating of Perceived Exertion (Exercise) 15    Perceived Dyspnea (Exercise) 0    Symptoms none    Duration Continue with 30 min of aerobic exercise without signs/symptoms of physical distress.    Intensity THRR unchanged      Progression   Progression Continue to progress workloads to maintain intensity without signs/symptoms of physical distress.    Average  METs 3.6      Resistance Training   Training Prescription Yes    Weight 5 lb    Reps 10-15      Interval Training   Interval Training No      Oxygen   Oxygen Continuous      Recumbant Bike   Level 12    Watts 61    Minutes 15    METs 4.21      NuStep   Level 6    Minutes 15    METs 3.6      T5 Nustep   Level 2    Minutes 15    METs 2.3      Home Exercise Plan   Plans to continue exercise at [employer] (comment)   Exercise at [facility], using recumbent bike   Frequency Add 3 additional days to program exercise sessions.    Initial Home Exercises Provided 02/06/23      Oxygen   Maintain Oxygen Saturation 88% or higher             Nutrition:  Target Goals: Understanding of nutrition guidelines, daily intake of sodium 1500mg ,  cholesterol 200mg , calories 30% from fat and 7% or less from saturated fats, daily to have 5 or more servings of fruits and vegetables.  Education: All About Nutrition: -Group instruction provided by verbal, written material, interactive activities, discussions, models, and posters to present general guidelines for heart healthy nutrition including fat, fiber, MyPlate, the role of sodium in heart healthy nutrition, utilization of the nutrition label, and utilization of this knowledge for meal planning. Follow up email sent as well. Written material given at graduation. Flowsheet Row Cardiac Rehab from 11/14/2022 in [facility] Cardiac and Pulmonary Rehab  Education need identified 10/14/22       Biometrics:  Pre Biometrics - 10/14/22 1647       Pre Biometrics   Height 5' 11.5" (1.816 m)    Weight 184 lb 14.4 oz (83.9 kg)    Waist Circumference 40.5 inches    Hip Circumference 40.5 inches    Waist to Hip Ratio 1 %    BMI (Calculated) 25.43    Single Leg Stand 2 seconds             Post Biometrics - 01/23/23 1120        Post  Biometrics   Height 5' 11.5" (1.816 m)    Weight 192 lb 3.2 oz (87.2 kg)    Waist Circumference 37 inches    Hip Circumference 39 inches    Waist to Hip Ratio 0.95 %    BMI (Calculated) 26.44    Single Leg Stand 5.63 seconds             Nutrition Therapy Plan and Nutrition Goals:  Nutrition Therapy & Goals - 10/14/22 1650       Nutrition Therapy   RD appointment deferred Yes      Intervention Plan   Intervention Prescribe, educate and counsel regarding individualized specific dietary modifications aiming towards targeted core components such as weight, hypertension, lipid management, diabetes, heart failure and other comorbidities.    Expected Outcomes Short Term Goal: A plan has been developed with personal nutrition goals set during dietitian appointment.;Long Term Goal: Adherence to prescribed nutrition plan.;Short Term Goal: Understand basic  principles of dietary content, such as calories, fat, sodium, cholesterol and nutrients.             Nutrition Assessments:  MEDIFICTS Score Key: >=70 Need  to make dietary changes  40-70 Heart Healthy Diet <= 40 Therapeutic Level Cholesterol Diet  Flowsheet Row Cardiac Rehab from 01/28/2023 in [facility] Cardiac and Pulmonary Rehab  Picture Your Plate Total Score on Admission 76  Picture Your Plate Total Score on Discharge 72      Picture Your Plate Scores: <78 Unhealthy dietary pattern with much room for improvement. 41-50 Dietary pattern unlikely to meet recommendations for good health and room for improvement. 51-60 More healthful dietary pattern, with some room for improvement.  >60 Healthy dietary pattern, although there may be some specific behaviors that could be improved.    Nutrition Goals Re-Evaluation:  Nutrition Goals Re-Evaluation     Row Name 01/09/23 1148 02/11/23 1118           Goals   Nutrition Goal Maisie Fus deferred RD eval. 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 He reports he has CKD 3 and sees a kidney Dr. reports it has improved from 3b to 3a classification. He was given a diet and his wife helps him stick to it. He reports it has worked well and he has seen positive results from sticking to it      Comment -- Short: Continue to follow kidney Dr recommendation. Long: Follow heart and kidney lifestyle changes               Nutrition Goals Discharge (Final Nutrition Goals Re-Evaluation):  Nutrition Goals Re-Evaluation - 02/11/23 1118       Goals   Nutrition Goal He reports he has CKD 3 and sees a kidney Dr. reports it has improved from 3b to 3a classification. He was given a diet and his wife helps him stick to it. He reports it has worked well and he has seen positive results from sticking to it    Comment Short: Continue to follow kidney Dr recommendation. Long: Follow heart and kidney lifestyle changes              Psychosocial: Target Goals: Acknowledge presence or absence of significant depression and/or stress, maximize coping skills, provide positive support system. Participant is able to verbalize types and ability to use techniques and skills needed for reducing stress and depression.   Education: Stress, Anxiety, and Depression - Group verbal and visual presentation to define topics covered.  Reviews how body is impacted by stress, anxiety, and depression.  Also discusses healthy ways to reduce stress and to treat/manage anxiety and depression.  Written material given at graduation.   Education: Sleep Hygiene -Provides group verbal and written instruction about how sleep can affect your health.  Define sleep hygiene, discuss sleep cycles and impact of sleep habits. Review good sleep hygiene tips.    Initial Review & Psychosocial Screening:  Initial Psych Review & Screening - 10/10/22 1111       Initial Review   Current issues with None Identified      Family Dynamics   Good Support System? Yes    Comments He can look to his wife for support. Now that his health is back under control he does not have any stressors or deprression.      Barriers   Psychosocial barriers to participate in program The patient should benefit from training in stress management and relaxation.;There are no identifiable barriers or psychosocial needs.      Screening Interventions   Interventions Encouraged to exercise;To provide support and resources with identified psychosocial needs;Provide feedback about the scores to participant  Expected Outcomes Short Term goal: Utilizing psychosocial counselor, staff and physician to assist with identification of specific Stressors or current issues interfering with healing process. Setting desired goal for each stressor or current issue identified.;Long Term Goal: Stressors or current issues are controlled or eliminated.;Short Term goal: Identification and review with  participant of any Quality of Life or Depression concerns found by scoring the questionnaire.;Long Term goal: The participant improves quality of Life and PHQ9 Scores as seen by post scores and/or verbalization of changes             Quality of Life Scores:   Quality of Life - 01/28/23 1114       Quality of Life Scores   Health/Function Pre 19.12 %    Health/Function Post 27 %    Health/Function % Change 41.21 %    Socioeconomic Pre 27.92 %    Socioeconomic Post 28.75 %    Socioeconomic % Change  2.97 %    Psych/Spiritual Pre 28.07 %    Psych/Spiritual Post 24.29 %    Psych/Spiritual % Change -13.47 %    Family Pre 30 %    Family Post 28.8 %    Family % Change -4 %    GLOBAL Pre 24.6 %    GLOBAL Post 27.02 %    GLOBAL % Change 9.84 %            Scores of 19 and below usually indicate a poorer quality of life in these areas.  A difference of  2-3 points is a clinically meaningful difference.  A difference of 2-3 points in the total score of the Quality of Life Index has been associated with significant improvement in overall quality of life, self-image, physical symptoms, and general health in studies assessing change in quality of life.  PHQ-9: Review Flowsheet  More data exists      10/14/2022 10/11/2022 07/03/2022 10/10/2021 10/09/2021  Depression screen PHQ 2/9  Decreased Interest 0 0 0 0 0 0  Down, Depressed, Hopeless 0 0 0 0 0 0  PHQ - 2 Score 0 0 0 0 0 0  Altered sleeping 1 0 - - 0  Tired, decreased energy 0 0 - - 0  Change in appetite 0 0 - - 0  Feeling bad or failure about yourself  0 0 - - 0  Trouble concentrating 0 1 - - 0  Moving slowly or fidgety/restless 0 0 - - 0  Suicidal thoughts 0 0 - - 0  PHQ-9 Score 1 1 - - 0  Difficult doing work/chores - Not difficult at all - - Not difficult at all    Details       Multiple values from one day are sorted in reverse-chronological order        Interpretation of Total Score  Total Score Depression Severity:   1-4 = Minimal depression, 5-9 = Mild depression, 10-14 = Moderate depression, 15-19 = Moderately severe depression, 20-27 = Severe depression   Psychosocial Evaluation and Intervention:  Psychosocial Evaluation - 10/10/22 1112       Psychosocial Evaluation & Interventions   Interventions Encouraged to exercise with the program and follow exercise prescription;Relaxation education;Stress management education    Comments He can look to his wife for support. Now that his health is back under control he does not have any stressors or deprression.    Expected Outcomes Short: Start HeartTrack to help with mood. Long: Maintain a healthy mental state    Continue Psychosocial Services  Follow up required by staff             Psychosocial Re-Evaluation:  Psychosocial Re-Evaluation     Row Name 01/09/23 1145 02/11/23 1116           Psychosocial Re-Evaluation   Current issues with None Identified None Identified      Comments No issues reported by Maisie Fus. He is doing well and has support at home Rodney Hall reports no anxiety, depression or stress. Has good support system. His wife helps him stay mentally positive in all situations      Expected Outcomes STG Rodney Hall remains stress/anxiety free. LTG He is able to manage any stress in his life Short: remain stress and anxiety free. Long: maintain postive outlook      Interventions Encouraged to attend Cardiac Rehabilitation for the exercise Encouraged to attend Cardiac Rehabilitation for the exercise      Continue Psychosocial Services  Follow up required by staff Follow up required by staff               Psychosocial Discharge (Final Psychosocial Re-Evaluation):  Psychosocial Re-Evaluation - 02/11/23 1116       Psychosocial Re-Evaluation   Current issues with None Identified    Comments Rodney Hall reports no anxiety, depression or stress. Has good support system. His wife helps him stay mentally positive in all situations    Expected Outcomes  Short: remain stress and anxiety free. Long: maintain postive outlook    Interventions Encouraged to attend Cardiac Rehabilitation for the exercise    Continue Psychosocial Services  Follow up required by staff             Vocational Rehabilitation: Provide vocational rehab assistance to qualifying candidates.   Vocational Rehab Evaluation & Intervention:  Vocational Rehab - 01/28/23 1114       Initial Vocational Rehab Evaluation & Intervention   Assessment shows need for Vocational Rehabilitation No      Discharge Vocational Rehab   Discharge Vocational Rehabilitation retired             Education: Education Goals: Education classes will be provided on a variety of topics geared toward better understanding of heart health and risk factor modification. Participant will state understanding/return demonstration of topics presented as noted by education test scores.  Learning Barriers/Preferences:  Learning Barriers/Preferences - 10/10/22 1110       Learning Barriers/Preferences   Learning Barriers None    Learning Preferences None             General Cardiac Education Topics:  AED/CPR: - Group verbal and written instruction with the use of models to demonstrate the basic use of the AED with the basic ABC's of resuscitation.   Anatomy and Cardiac Procedures: - Group verbal and visual presentation and models provide information about basic cardiac anatomy and function. Reviews the testing methods done to diagnose heart disease and the outcomes of the test results. Describes the treatment choices: Medical Management, Angioplasty, or Coronary Bypass Surgery for treating various heart conditions including Myocardial Infarction, Angina, Valve Disease, and Cardiac Arrhythmias.  Written material given at graduation. Flowsheet Row Cardiac Rehab from 11/14/2022 in [facility] Cardiac and Pulmonary Rehab  Education need identified 10/14/22  Date 11/14/22  Educator SB  Instruction  Review Code 1- Verbalizes Understanding       Medication Safety: - Group verbal and visual instruction to review commonly prescribed medications for heart and lung disease. Reviews the medication, class of the drug, and side effects. Includes the steps  to properly store meds and maintain the prescription regimen.  Written material given at graduation.   Intimacy: - Group verbal instruction through game format to discuss how heart and lung disease can affect sexual intimacy. Written material given at graduation.. Flowsheet Row Cardiac Rehab from 11/14/2022 in [facility] Cardiac and Pulmonary Rehab  Date 10/17/22  Educator NT  Instruction Review Code 1- Verbalizes Understanding       Know Your Numbers and Heart Failure: - Group verbal and visual instruction to discuss disease risk factors for cardiac and pulmonary disease and treatment options.  Reviews associated critical values for Overweight/Obesity, Hypertension, Cholesterol, and Diabetes.  Discusses basics of heart failure: signs/symptoms and treatments.  Introduces Heart Failure Zone chart for action plan for heart failure.  Written material given at graduation. Flowsheet Row Cardiac Rehab from 11/14/2022 in [facility] Cardiac and Pulmonary Rehab  Education need identified 10/14/22       Infection Prevention: - Provides verbal and written material to individual with discussion of infection control including proper hand washing and proper equipment cleaning during exercise session. Flowsheet Row Cardiac Rehab from 11/14/2022 in [facility] Cardiac and Pulmonary Rehab  Date 10/14/22  Educator MB  Instruction Review Code 1- Verbalizes Understanding       Falls Prevention: - Provides verbal and written material to individual with discussion of falls prevention and safety. Flowsheet Row Cardiac Rehab from 11/14/2022 in [facility] Cardiac and Pulmonary Rehab  Date 10/14/22  Educator MB  Instruction Review Code 1- Verbalizes Understanding        Other: -Provides group and verbal instruction on various topics (see comments)   Knowledge Questionnaire Score:  Knowledge Questionnaire Score - 01/28/23 1113       Knowledge Questionnaire Score   Pre Score 20/26    Post Score 26/26             Core Components/Risk Factors/Patient Goals at Admission:  Personal Goals and Risk Factors at Admission - 10/14/22 1658       Core Components/Risk Factors/Patient Goals on Admission    Weight Management Yes;Weight Maintenance    Intervention Weight Management: Develop a combined nutrition and exercise program designed to reach desired caloric intake, while maintaining appropriate intake of nutrient and fiber, sodium and fats, and appropriate energy expenditure required for the weight goal.;Weight Management: Provide education and appropriate resources to help participant work on and attain dietary goals.;Weight Management/Obesity: Establish reasonable short term and long term weight goals.    Admit Weight 184 lb 14.4 oz (83.9 kg)    Goal Weight: Short Term 184 lb (83.5 kg)    Goal Weight: Long Term 184 lb (83.5 kg)    Expected Outcomes Short Term: Continue to assess and modify interventions until short term weight is achieved;Long Term: Adherence to nutrition and physical activity/exercise program aimed toward attainment of established weight goal;Weight Maintenance: Understanding of the daily nutrition guidelines, which includes 25-35% calories from fat, 7% or less cal from saturated fats, less than 200mg  cholesterol, less than 1.5gm of sodium, & 5 or more servings of fruits and vegetables daily;Understanding recommendations for meals to include 15-35% energy as protein, 25-35% energy from fat, 35-60% energy from carbohydrates, less than 200mg  of dietary cholesterol, 20-35 gm of total fiber daily;Understanding of distribution of calorie intake throughout the day with the consumption of 4-5 meals/snacks    Heart Failure Yes    Intervention  Provide a combined exercise and nutrition program that is supplemented with education, support and counseling about heart failure. Directed toward relieving  symptoms such as shortness of breath, decreased exercise tolerance, and extremity edema.    Expected Outcomes Improve functional capacity of life;Short term: Attendance in program 2-3 days a week with increased exercise capacity. Reported lower sodium intake. Reported increased fruit and vegetable intake. Reports medication compliance.;Short term: Daily weights obtained and reported for increase. Utilizing diuretic protocols set by physician.;Long term: Adoption of self-care skills and reduction of barriers for early signs and symptoms recognition and intervention leading to self-care maintenance.    Lipids Yes    Intervention Provide education and support for participant on nutrition & aerobic/resistive exercise along with prescribed medications to achieve LDL 70mg , HDL >40mg .    Expected Outcomes Short Term: Participant states understanding of desired cholesterol values and is compliant with medications prescribed. Participant is following exercise prescription and nutrition guidelines.;Long Term: Cholesterol controlled with medications as prescribed, with individualized exercise RX and with personalized nutrition plan. Value goals: LDL < 70mg , HDL > 40 mg.             Education:Diabetes - Individual verbal and written instruction to review signs/symptoms of diabetes, desired ranges of glucose level fasting, after meals and with exercise. Acknowledge that pre and post exercise glucose checks will be done for 3 sessions at entry of program.   Core Components/Risk Factors/Patient Goals Review:   Goals and Risk Factor Review     Row Name 01/09/23 1150 02/11/23 1121           Core Components/Risk Factors/Patient Goals Review   Personal Goals Review Weight Management/Obesity;Heart Failure;Lipids Weight Management/Obesity;Heart  Failure;Lipids      Review Rodney Hall has kept his weight in the range he prefers. He works with his dialysis team to watch his weight. He is taking his prescribed meds and is working with a RD to manage his health. HE stated he is eating fruit and vegs as told. HIs last visit with nephrologist: was tole his kidneys are stable and all was excellent He rerports he has lost some weight in the past year since starting a heart healthy kidney friendly diet. He likes fruits and veggies, including them at meal when able. Continue with support team to help improve kidney and heart health      Expected Outcomes STG continue to work with dialysis team on weight and risk factor control while in program. LTG Maintian his nutrition, meds and exercise to keep healthy Short: conitnue to work with health team and stick to diet. Long: follow heart and kidney lifestyle changes               Core Components/Risk Factors/Patient Goals at Discharge (Final Review):   Goals and Risk Factor Review - 02/11/23 1121       Core Components/Risk Factors/Patient Goals Review   Personal Goals Review Weight Management/Obesity;Heart Failure;Lipids    Review He rerports he has lost some weight in the past year since starting a heart healthy kidney friendly diet. He likes fruits and veggies, including them at meal when able. Continue with support team to help improve kidney and heart health    Expected Outcomes Short: conitnue to work with health team and stick to diet. Long: follow heart and kidney lifestyle changes             ITP Comments:  ITP Comments     Row Name 10/10/22 1110 10/14/22 1611 10/14/22 1705 10/15/22 1131 11/06/22 0913   ITP Comments Virtual Visit completed. Patient informed on EP and RD appointment and 6 Minute walk test. Patient  also informed of patient health questionnaires on My Chart. Patient Verbalizes understanding. Visit diagnosis can be found in [facility] 09/10/2022. Completed and gym orientation.  Initial ITP created and sent for review to Dr. Bethann Punches, Medical Director. Completed and gym orientation. Initial ITP created and sent for review to Dr. Bethann Punches, Medical Director. First full day of exercise!  Patient was oriented to gym and equipment including functions, settings, policies, and procedures.  Patient's individual exercise prescription and treatment plan were reviewed.  All starting workloads were established based on the results of the 6 minute walk test done at initial orientation visit.  The plan for exercise progression was also introduced and progression will be customized based on patient's performance and goals. 30 Day review completed. Medical Director ITP review done, changes made as directed, and signed approval by Medical Director.     new to program    Row Name 12/04/22 0944 01/01/23 0829 01/22/23 1119 02/19/23 0932     ITP Comments 30 Day review completed. Medical Director ITP review done, changes made as directed, and signed approval by Medical Director. 30 Day review completed. Medical Director ITP review done, changes made as directed, and signed approval by Medical Director. 30 Day review completed. Medical Director ITP review done, changes made as directed, and signed approval by Medical Director. 30 Day review completed. Medical Director ITP review done, changes made as directed, and signed approval by Medical Director.             Comments:

## 2023-02-19 NOTE — Progress Notes (Signed)
30 Day review completed. Medical Director ITP review done, changes made as directed, and signed approval by Medical Director.  

## 2023-02-20 ENCOUNTER — Encounter: Payer: HMO | Admitting: *Deleted

## 2023-02-20 DIAGNOSIS — Z48812 Encounter for surgical aftercare following surgery on the circulatory system: Secondary | ICD-10-CM | POA: Diagnosis not present

## 2023-02-20 DIAGNOSIS — Z951 Presence of aortocoronary bypass graft: Secondary | ICD-10-CM

## 2023-02-20 NOTE — Progress Notes (Signed)
Cardiac Individual Treatment Plan  Patient Details  Name: Rodney Hall. MRN: [id number] Date of Birth: 01-24-1946 Referring Provider:   Flowsheet Row Cardiac Rehab from 10/14/2022 in [facility] Cardiac and Pulmonary Rehab  Referring Provider Dr. Julien Nordmann       Initial Encounter Date:  Flowsheet Row Cardiac Rehab from 10/14/2022 in [facility] Cardiac and Pulmonary Rehab  Date 10/14/22       Visit Diagnosis: S/P CABG x 5  Patient's Home Medications on Admission:  Current Outpatient Medications:    acetaminophen (TYLENOL) 325 MG tablet, Take 650 mg by mouth every 6 (six) hours as needed for moderate pain or headache., Disp: , Rfl:    aspirin EC 81 MG tablet, Take 1 tablet (81 mg total) by mouth daily. Swallow whole., Disp: 30 tablet, Rfl: 12   atorvastatin (LIPITOR) 10 MG tablet, Take 1 tablet (10 mg total) by mouth daily., Disp: 90 tablet, Rfl: 3   clopidogrel (PLAVIX) 75 MG tablet, Take 1 tablet (75 mg total) by mouth daily., Disp: 90 tablet, Rfl: 3   imipramine (TOFRANIL) 50 MG tablet, Take 1 tablet (50 mg total) by mouth at bedtime., Disp: 90 tablet, Rfl: 3   tamsulosin (FLOMAX) 0.4 MG CAPS capsule, Take 1 capsule (0.4 mg total) by mouth daily after supper., Disp: 90 capsule, Rfl: 3  Past Medical History: Past Medical History:  Diagnosis Date   Abdominal mass    Abdominal pain 06/02/2017   Anxiety    Pt. denies   Arthritis    pt. 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343  7.363  7.377  7.322  7.357  7.428   PCO2 arterial 32 - 48 mmHg - - - - 37.9  35.8  38.9  39.3  37.8  46.0  38.3  33.7   Bicarbonate 20.0 - 28.0 mmol/L - - - - 20.8  20.3  21.1  22.4  22.2  24.2  23.8  21.5  22.3   TCO2 22 - 32 mmol/L - - - - 22  21  24  22  24  24  23  23  26  25  26  24  23  23    Acid-base deficit 0.0 - 2.0 mmol/L - - - - 5.0  5.0  4.0  3.0  3.0  2.0  2.0  4.0  2.0   O2 Saturation % - - - - 97  96  99  100  100  82  100  100  91     Details       Multiple values from one day are sorted in reverse-chronological order          Exercise Target Goals: Exercise Program Goal: Individual exercise prescription set using results from initial 6 min walk test and THRR while considering  patient's activity barriers and safety.   Exercise Prescription Goal: Initial exercise prescription builds to 30-45 minutes a day of aerobic activity, 2-3 days per week.  Home exercise guidelines will be given to patient during program as part of exercise prescription that the participant will acknowledge.   Education: Aerobic Exercise: - Group verbal and visual presentation on the components of exercise prescription. Introduces F.I.T.T principle from ACSM for exercise prescriptions.  Reviews F.I.T.T. principles of aerobic exercise including progression. Written material given at graduation. Flowsheet Row Cardiac Rehab from 11/14/2022 in [facility] Cardiac and Pulmonary Rehab  Education need identified 10/14/22  Date 10/17/22  Educator NT  Instruction Review Code 1- Verbalizes Understanding        Education: Resistance Exercise: - Group verbal and visual presentation on the components of exercise prescription. Introduces F.I.T.T principle from ACSM for exercise prescriptions  Reviews F.I.T.T. principles of resistance exercise including progression. Written material given at graduation. Flowsheet Row Cardiac Rehab from 11/14/2022 in [facility] Cardiac and Pulmonary Rehab  Education need identified 10/14/22  Date 10/24/22  Educator NT  Instruction Review Code 1- [employer] Understanding        Education: Exercise & Equipment Safety: - Individual verbal instruction and demonstration of equipment use and safety with use of the equipment. Flowsheet Row Cardiac Rehab from 11/14/2022 in [facility] Cardiac and Pulmonary Rehab  Date 10/14/22  Educator MB  Instruction Review Code 1- Verbalizes Understanding       Education: Exercise Physiology & General Exercise Guidelines: - Group verbal and written instruction with models to review the exercise physiology of the cardiovascular system and associated critical values. Provides general exercise guidelines with specific guidelines to those with heart or lung disease.  Flowsheet Row Cardiac Rehab from 11/14/2022 in [facility] Cardiac and Pulmonary Rehab  Education need identified 10/14/22       Education: Flexibility, Balance, Mind/Body Relaxation: - Group verbal and visual presentation with interactive activity on the components of exercise prescription. Introduces F.I.T.T principle from ACSM for exercise prescriptions. Reviews F.I.T.T. principles of flexibility and balance exercise training including progression. Also discusses the mind body connection.  Reviews various relaxation techniques to help reduce and manage stress (i.e. Deep breathing, progressive muscle relaxation, and visualization). Balance handout provided to take home. Written material given at graduation. Flowsheet Row Cardiac Rehab from 11/14/2022 in [facility] Cardiac and Pulmonary Rehab  Date  10/24/22  Educator NT  Instruction Review Code 1- Verbalizes Understanding       Activity Barriers & Risk Stratification:  Activity Barriers & Cardiac Risk Stratification - 10/14/22 1615       Activity Barriers & Cardiac Risk Stratification   Activity Barriers Back Problems;Balance Concerns;History of Falls;Other (comment)    Comments Dizziness    Cardiac Risk Stratification High             6 Minute Walk:  6 Minute Walk     Row Name 10/14/22 1612 01/23/23 1118       6 Minute Walk  Phase Initial Discharge    Distance 1090 feet 1370 feet    Distance % Change -- 25.7 %    Distance Feet Change -- 280 ft    Walk Time 6 minutes 6 minutes    # of Rest Breaks 0 0    MPH 2.06 2.59    METS 2.24 2.86    RPE 9 11    Perceived Dyspnea  0 0    VO2 Peak 7.82 10.03    Symptoms Yes (comment) Yes (comment)    Comments Back pain 3/10 Back pain 8/10    Resting HR 83 bpm 83 bpm    Resting BP 110/64 122/68    Resting Oxygen Saturation  98 % 96 %    Exercise Oxygen Saturation  during 6 min walk 97 % 90 %    Max Ex. HR 104 bpm 113 bpm    Max Ex. BP 110/62 126/66    2 Minute Post BP 100/72 --             Oxygen Initial Assessment:   Oxygen Re-Evaluation:   Oxygen Discharge (Final Oxygen Re-Evaluation):   Initial Exercise Prescription:  Initial Exercise Prescription - 01/02/23 1500       Recumbant Bike   Watts 62             Perform Capillary Blood Glucose checks as needed.  Exercise Prescription Changes:   Exercise Prescription Changes     Row Name 10/14/22 1600 10/22/22 1100 11/05/22 1300 11/19/22 1300 12/05/22 1500     Response to Exercise   Blood Pressure (Admit) 110/64 104/52 96/58 98/58  98/60   Blood Pressure (Exercise) 110/62 128/64 136/56 138/62 --   Blood Pressure (Exit) 100/72 104/66 84/50 110/62 116/62   Heart Rate (Admit) 83 bpm 79 bpm 77 bpm 92 bpm 87 bpm   Heart Rate (Exercise) 104 bpm 100 bpm 108 bpm 111 bpm 111 bpm   Heart Rate  (Exit) 90 bpm 91 bpm 88 bpm 89 bpm 101 bpm   Oxygen Saturation (Admit) 98 % -- -- -- --   Oxygen Saturation (Exercise) 97 % -- -- -- --   Oxygen Saturation (Exit) 97 % -- -- -- --   Rating of Perceived Exertion (Exercise) 9 11 13 13 15    Perceived Dyspnea (Exercise) 0 -- -- 0 0   Symptoms Back pain -- -- none none   Comments Results -- -- -- --   Duration -- -- Continue with 30 min of aerobic exercise without signs/symptoms of physical distress. Continue with 30 min of aerobic exercise without signs/symptoms of physical distress. Continue with 30 min of aerobic exercise without signs/symptoms of physical distress.   Intensity -- -- THRR unchanged THRR unchanged THRR unchanged     Progression   Progression -- -- Continue to progress workloads to maintain intensity without signs/symptoms of physical distress. Continue to progress workloads to maintain intensity without signs/symptoms of physical distress. Continue to progress workloads to maintain intensity without signs/symptoms of physical distress.   Average METs -- -- 2.7 3.4 3.26     Resistance Training   Training Prescription -- Yes Yes Yes Yes   Weight -- 5lbs 5 lb 5 lb 5 lb   Reps -- 10-15 10-15 10-15 10-15     Interval Training   Interval Training -- -- No No No     Treadmill   MPH -- -- 1.4 -- --   Grade -- -- 0 -- --   Minutes -- -- 15 -- --  METs -- -- 2.07 -- --     Recumbant Bike   Level -- 3 10 11 11    RPM -- 50 -- 50 50   Minutes -- 15 15 15 15    METs -- -- -- 3.85 3.85     NuStep   Level -- 2 -- 1  T6 7  T6   SPM -- 80 -- -- --   Minutes -- 15 -- 15 15   METs -- -- -- 2.4 3.1     REL-XR   Level -- -- -- 9 7   Minutes -- -- -- 15 15   METs -- -- -- -- 4     T5 Nustep   Level -- 2 -- -- --   SPM -- 80 -- -- --   Minutes -- 15 -- -- --     Biostep-RELP   Level -- -- -- -- 4   Minutes -- -- -- -- 15   METs -- -- -- -- 3     Oxygen   Maintain Oxygen Saturation -- -- 88% or higher 88% or higher  88% or higher    Row Name 12/19/22 1600 01/02/23 1500 01/16/23 1500 01/29/23 1500 02/06/23 1100     Response to Exercise   Blood Pressure (Admit) 122/62 106/60 118/62 122/68 --   Blood Pressure (Exercise) -- -- -- 126/66 --   Blood Pressure (Exit) 112/64 110/64 114/70 102/58 --   Heart Rate (Admit) 81 bpm 86 bpm 91 bpm 83 bpm --   Heart Rate (Exercise) 115 bpm 119 bpm 119 bpm 113 bpm --   Heart Rate (Exit) 100 bpm 100 bpm 83 bpm 82 bpm --   Oxygen Saturation (Admit) -- -- -- 96 % --   Oxygen Saturation (Exercise) -- -- -- 90 % --   Oxygen Saturation (Exit) -- -- -- 94 % --   Rating of Perceived Exertion (Exercise) 15 15 15 15  --   Perceived Dyspnea (Exercise) 0 0 -- 0 --   Symptoms none none none none --   Comments -- -- -- Post completed --   Duration Continue with 30 min of aerobic exercise without signs/symptoms of physical distress. Continue with 30 min of aerobic exercise without signs/symptoms of physical distress. Continue with 30 min of aerobic exercise without signs/symptoms of physical distress. Continue with 30 min of aerobic exercise without signs/symptoms of physical distress. --   Intensity THRR unchanged THRR unchanged THRR unchanged THRR unchanged --     Progression   Progression Continue to progress workloads to maintain intensity without signs/symptoms of physical distress. Continue to progress workloads to maintain intensity without signs/symptoms of physical distress. Continue to progress workloads to maintain intensity without signs/symptoms of physical distress. Continue to progress workloads to maintain intensity without signs/symptoms of physical distress. --   Average METs 3.5 4.24 4.18 3.98 --     Resistance Training   Training Prescription Yes Yes Yes Yes --   Weight 5 lb 5 lb 5 lb 5 lb --   Reps 10-15 10-15 10-15 10-15 --     Interval Training   Interval Training No No No No --     Recumbant Bike   Level 12  30 watt 13 13 12  --   Watts -- -- 60 61  --   Minutes 15 15 15 15  --   METs 3.09 4.25 4.17 4.2 --     NuStep   Level 5 7 7 6  --   Minutes  15 15 15 15  --   METs 3.7 5.1 3.5 4.1 --     REL-XR   Level 8 8 -- -- --   Minutes 15 15 -- -- --   METs 5.2 -- -- -- --     T5 Nustep   Level 7  T6 -- -- -- --   Minutes 15 -- -- -- --   METs 3.8 -- -- -- --     Biostep-RELP   Level 2 -- -- -- --   Minutes 15 -- -- -- --   METs 2 -- -- -- --     Home Exercise Plan   Plans to continue exercise at -- -- -- -- [facility] (comment)  Exercise at [facility], using recumbent bike   Frequency -- -- -- -- Add 3 additional days to program exercise sessions.   Initial Home Exercises Provided -- -- -- -- 02/06/23     Oxygen   Maintain Oxygen Saturation 88% or higher 88% or higher 88% or higher 88% or higher 88% or higher    Row Name 02/13/23 0900 02/20/23 1000           Response to Exercise   Blood Pressure (Admit) 96/66 96/66      Blood Pressure (Exit) 98/56 98/56      Heart Rate (Admit) 84 bpm 84 bpm      Heart Rate (Exercise) 103 bpm 103 bpm      Heart Rate (Exit) 94 bpm 94 bpm      Rating of Perceived Exertion (Exercise) 15 15      Perceived Dyspnea (Exercise) 0 0      Symptoms none none      Duration Continue with 30 min of aerobic exercise without signs/symptoms of physical distress. Continue with 30 min of aerobic exercise without signs/symptoms of physical distress.      Intensity THRR unchanged THRR unchanged        Progression   Progression Continue to progress workloads to maintain intensity without signs/symptoms of physical distress. Continue to progress workloads to maintain intensity without signs/symptoms of physical distress.      Average METs 3.6 3.6        Resistance Training   Training Prescription Yes Yes      Weight 5 lb 5 lb      Reps 10-15 10-15        Interval Training   Interval Training No No        Oxygen   Oxygen Continuous Continuous        Recumbant Bike   Level 12 12      Watts  61 61      Minutes 15 15      METs 4.21 4.21        NuStep   Level 6 6      Minutes 15 15      METs 3.6 3.6        T5 Nustep   Level 2 2      Minutes 15 15      METs 2.3 2.3        Home Exercise Plan   Plans to continue exercise at [facility] (comment)  Exercise at [facility], using recumbent bike [facility] (comment)  Exercise at [facility], using recumbent bike      Frequency Add 3 additional days to program exercise sessions. Add 3 additional days to program exercise sessions.      Initial Home Exercises  Provided 02/06/23 02/06/23        Oxygen   Maintain Oxygen Saturation 88% or higher 88% or higher               Exercise Comments:   Exercise Comments     Row Name 10/15/22 1131           Exercise Comments First full day of exercise!  Patient was oriented to gym and equipment including functions, settings, policies, and procedures.  Patient's individual exercise prescription and treatment plan were reviewed.  All starting workloads were established based on the results of the 6 minute walk test done at initial orientation visit.  The plan for exercise progression was also introduced and progression will be customized based on patient's performance and goals.                Exercise Goals and Review:   Exercise Goals     Row Name 10/14/22 1647             Exercise Goals   Increase Physical Activity Yes       Intervention Provide advice, education, support and counseling about physical activity/exercise needs.;Develop an individualized exercise prescription for aerobic and resistive training based on initial evaluation findings, risk stratification, comorbidities and participant's personal goals.       Expected Outcomes Short Term: Attend rehab on a regular basis to increase amount of physical activity.;Long Term: Exercising regularly at least 3-5 days a week.;Long Term: Add in home exercise to make exercise part of routine and to increase  amount of physical activity.       Increase Strength and Stamina Yes       Intervention Provide advice, education, support and counseling about physical activity/exercise needs.;Develop an individualized exercise prescription for aerobic and resistive training based on initial evaluation findings, risk stratification, comorbidities and participant's personal goals.       Expected Outcomes Short Term: Increase workloads from initial exercise prescription for resistance, speed, and METs.;Short Term: Perform resistance training exercises routinely during rehab and add in resistance training at home;Long Term: Improve cardiorespiratory fitness, muscular endurance and strength as measured by increased METs and functional capacity ( )       Able to understand and use rate of perceived exertion (RPE) scale Yes       Intervention Provide education and explanation on how to use RPE scale       Expected Outcomes Short Term: Able to use RPE daily in rehab to express subjective intensity level;Long Term:  Able to use RPE to guide intensity level when exercising independently       Able to understand and use Dyspnea scale Yes       Intervention Provide education and explanation on how to use Dyspnea scale       Expected Outcomes Short Term: Able to use Dyspnea scale daily in rehab to express subjective sense of shortness of breath during exertion;Long Term: Able to use Dyspnea scale to guide intensity level when exercising independently       Knowledge and understanding of Target Heart Rate Range (THRR) Yes       Intervention Provide education and explanation of THRR including how the numbers were predicted and where they are located for reference       Expected Outcomes Short Term: Able to state/look up THRR;Long Term: Able to use THRR to govern intensity when exercising independently;Short Term: Able to use daily as guideline for intensity in rehab  Able to check pulse independently Yes       Intervention  Provide education and demonstration on how to check pulse in carotid and radial arteries.;Review the importance of being able to check your own pulse for safety during independent exercise       Expected Outcomes Short Term: Able to explain why pulse checking is important during independent exercise;Long Term: Able to check pulse independently and accurately       Understanding of Exercise Prescription Yes       Intervention Provide education, explanation, and written materials on patient's individual exercise prescription       Expected Outcomes Short Term: Able to explain program exercise prescription;Long Term: Able to explain home exercise prescription to exercise independently                Exercise Goals Re-Evaluation :  Exercise Goals Re-Evaluation     Row Name 10/15/22 1131 10/22/22 1132 11/05/22 1346 11/19/22 1346 12/05/22 1522     Exercise Goal Re-Evaluation   Exercise Goals Review Able to understand and use rate of perceived exertion (RPE) scale;Able to understand and use Dyspnea scale;Understanding of Exercise Prescription Increase Physical Activity;Understanding of Exercise Prescription;Increase Strength and Stamina Increase Physical Activity;Understanding of Exercise Prescription;Increase Strength and Stamina Increase Physical Activity;Understanding of Exercise Prescription;Increase Strength and Stamina Increase Physical Activity;Understanding of Exercise Prescription;Increase Strength and Stamina   Comments Reviewed RPE  and dyspnea scale, THR and program prescription with pt today.  Pt voiced understanding and was given a copy of goals to take home. Rodney Hall is off to a good start in the program. He has increased the bike workload. We will continue to monitor his progress in the program Rodney Hall is doing well in rehab. He began walking the treadmill at a speed of 1.4 mph with no incline and did well. He also improved to level 10 on the recumbent bike, level 3 on the T4 nustep, and level  6 on the XR. We will continue to monitor his progress in the program. Rodney Hall is doing well in rehab. He increased his XR level from 6 to 9, and he also increased his level on the recumbent bike from 10 to 11. We will continue to monitor his progress in the program. Rodney Hall continues to do well in rehab. He was recently able to increase his level on the T6 nustep from 1 to 7. He has also been able to maintain an intensity of level 11 on the recumbent bike. We will continue to monitor his progress in the program.   Expected Outcomes Short: Use RPE daily to regulate intensity. Long: Follow program prescription in THR. Short: Continue to follow current exercise prescription and progressivley increase bike workload. Long: Continue exercise to improve strength and stamina. Short: Continue to progressively increase treadmill workload. Long: Continue exercise to improve strength and stamina. Short: Continue to follow current exercise prescription, and progressively increase workloads. Long: Continue exercise to improve strength and stamina. Short: Continue to follow current exercise prescription, and progressively increase workloads. Long: Continue exercise to improve strength and stamina.    Row Name 12/19/22 1636 01/02/23 1519 01/09/23 1153 01/16/23 1537 01/29/23 1532     Exercise Goal Re-Evaluation   Exercise Goals Review Increase Physical Activity;Understanding of Exercise Prescription;Increase Strength and Stamina Increase Physical Activity;Understanding of Exercise Prescription;Increase Strength and Stamina Increase Physical Activity;Increase Strength and Stamina Increase Physical Activity;Increase Strength and Stamina;Understanding of Exercise Prescription Increase Physical Activity;Increase Strength and Stamina;Understanding of Exercise Prescription   Comments Rodney Hall continues to do  well in rehab. He was recently able to increase his level on the recumbent bike from level 11 to 12. He has also been able to maintain his  intensity on the T6. We will continue to monitor his progress in the program. Rodney Hall continues to do well in rehab. He was able to increase his level on the recumbent bike from 12 to 13. He was also able to increase his level on the T4 nustep from level 5 to 7. We will continue to monitor his progress in the program. Rodney Hall states he is exercising at the gym 4 days that he is not at program Rodney Hall continues to do well in the program. He has continued to consistently work at level 7 on the T4 nustep and level 13 on the recumbent bike. He also is due for his post and will look to improve on it. We will continue to monitor his progress in the program. Rodney Hall continues to do well in rehab. He improved on his post by 25.7% and is due to graduate the program soon. He has been doing well with his current exercise prescription working at level 6 on the T4 nustep and level 12 on the recumbent bike. We will continue to monitor his progress in the program.   Expected Outcomes Short: Continue to follow current exercise prescription, and progressively increase workloads. Long: Continue exercise to improve strength and stamina. Short: Continue to follow current exercise prescription, and progressively increase workloads. Long: Continue exercise to improve strength and stamina. Short: Continue to follow current exercise prescription, and progressively increase workloads. Long: Continue exercise to improve strength and stamina. Short: Improve on post . Long: Continue exercise to improve strength and stamina. Short: Graduate. Long: Continue to exercise independently.    Row Name 02/06/23 1140 02/13/23 0948           Exercise Goal Re-Evaluation   Exercise Goals Review Increase Physical Activity;Able to understand and use Dyspnea scale;Understanding of Exercise Prescription;Knowledge and understanding of Target Heart Rate Range (THRR);Increase Strength and Stamina;Able to check pulse independently;Able to understand and  use rate of perceived exertion (RPE) scale Increase Physical Activity;Increase Strength and Stamina;Understanding of Exercise Prescription      Comments Reviewed home exercise with pt today.  Pt plans to workout at [facility] six days a week using the recumbent bike for exercise.  Reviewed THR, pulse, RPE, sign and symptoms, pulse oximetery and when to call 911 or MD.  Also discussed weather considerations and indoor options.  Pt voiced understanding. Rodney Hall continues to do well in rehab. He has been able to maintain his intensity on the recumbent bike at level 12, and the T4 nustep at level 6. We will continue to monitor his progress as he approaches graduation.      Expected Outcomes Short: Continue to use recumbent bike on days away from rehab. Long: Continue to exercise independently. Short: Graduate. Long: Continue exercise to improve strength and stamina.               Discharge Exercise Prescription (Final Exercise Prescription Changes):  Exercise Prescription Changes - 02/20/23 1000       Response to Exercise   Blood Pressure (Admit) 96/66    Blood Pressure (Exit) 98/56    Heart Rate (Admit) 84 bpm    Heart Rate (Exercise) 103 bpm    Heart Rate (Exit) 94 bpm    Rating of Perceived Exertion (Exercise) 15    Perceived Dyspnea (Exercise) 0    Symptoms  none    Duration Continue with 30 min of aerobic exercise without signs/symptoms of physical distress.    Intensity THRR unchanged      Progression   Progression Continue to progress workloads to maintain intensity without signs/symptoms of physical distress.    Average METs 3.6      Resistance Training   Training Prescription Yes    Weight 5 lb    Reps 10-15      Interval Training   Interval Training No      Oxygen   Oxygen Continuous      Recumbant Bike   Level 12    Watts 61    Minutes 15    METs 4.21      NuStep   Level 6    Minutes 15    METs 3.6      T5 Nustep   Level 2    Minutes 15    METs 2.3      Home  Exercise Plan   Plans to continue exercise at [facility] (comment)   Exercise at [facility], using recumbent bike   Frequency Add 3 additional days to program exercise sessions.    Initial Home Exercises Provided 02/06/23      Oxygen   Maintain Oxygen Saturation 88% or higher             Nutrition:  Target Goals: Understanding of nutrition guidelines, daily intake of sodium 1500mg , cholesterol 200mg , calories 30% from fat and 7% or less from saturated fats, daily to have 5 or more servings of fruits and vegetables.  Education: All About Nutrition: -Group instruction provided by verbal, written material, interactive activities, discussions, models, and posters to present general guidelines for heart healthy nutrition including fat, fiber, MyPlate, the role of sodium in heart healthy nutrition, utilization of the nutrition label, and utilization of this knowledge for meal planning. Follow up email sent as well. Written material given at graduation. Flowsheet Row Cardiac Rehab from 11/14/2022 in [facility] Cardiac and Pulmonary Rehab  Education need identified 10/14/22       Biometrics:  Pre Biometrics - 10/14/22 1647       Pre Biometrics   Height 5' 11.5" (1.816 m)    Weight 184 lb 14.4 oz (83.9 kg)    Waist Circumference 40.5 inches    Hip Circumference 40.5 inches    Waist to Hip Ratio 1 %    BMI (Calculated) 25.43    Single Leg Stand 2 seconds             Post Biometrics - 01/23/23 1120        Post  Biometrics   Height 5' 11.5" (1.816 m)    Weight 192 lb 3.2 oz (87.2 kg)    Waist Circumference 37 inches    Hip Circumference 39 inches    Waist to Hip Ratio 0.95 %    BMI (Calculated) 26.44    Single Leg Stand 5.63 seconds             Nutrition Therapy Plan and Nutrition Goals:  Nutrition Therapy & Goals - 10/14/22 1650       Nutrition Therapy   RD appointment deferred Yes      Intervention Plan   Intervention Prescribe, educate and counsel  regarding individualized specific dietary modifications aiming towards targeted core components such as weight, hypertension, lipid management, diabetes, heart failure and other comorbidities.    Expected Outcomes Short Term Goal: A plan has been developed with personal nutrition  goals set during dietitian appointment.;Long Term Goal: Adherence to prescribed nutrition plan.;Short Term Goal: Understand basic principles of dietary content, such as calories, fat, sodium, cholesterol and nutrients.             Nutrition Assessments:  MEDIFICTS Score Key: >=70 Need to make dietary changes  40-70 Heart Healthy Diet <= 40 Therapeutic Level Cholesterol Diet  Flowsheet Row Cardiac Rehab from 01/28/2023 in [facility] Cardiac and Pulmonary Rehab  Picture Your Plate Total Score on Admission 76  Picture Your Plate Total Score on Discharge 72      Picture Your Plate Scores: <16 Unhealthy dietary pattern with much room for improvement. 41-50 Dietary pattern unlikely to meet recommendations for good health and room for improvement. 51-60 More healthful dietary pattern, with some room for improvement.  >60 Healthy dietary pattern, although there may be some specific behaviors that could be improved.    Nutrition Goals Re-Evaluation:  Nutrition Goals Re-Evaluation     Row Name 01/09/23 1148 02/11/23 1118           Goals   Nutrition Goal Rodney Hall deferred RD eval. 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 He reports he has CKD 3 and sees a kidney Dr. reports it has improved from 3b to 3a classification. He was given a diet and his wife helps him stick to it. He reports it has worked well and he has seen positive results from sticking to it      Comment -- Short: Continue to follow kidney Dr recommendation. Long: Follow heart and kidney lifestyle changes               Nutrition Goals Discharge (Final Nutrition Goals Re-Evaluation):  Nutrition Goals Re-Evaluation - 02/11/23  1118       Goals   Nutrition Goal He reports he has CKD 3 and sees a kidney Dr. reports it has improved from 3b to 3a classification. He was given a diet and his wife helps him stick to it. He reports it has worked well and he has seen positive results from sticking to it    Comment Short: Continue to follow kidney Dr recommendation. Long: Follow heart and kidney lifestyle changes             Psychosocial: Target Goals: Acknowledge presence or absence of significant depression and/or stress, maximize coping skills, provide positive support system. Participant is able to verbalize types and ability to use techniques and skills needed for reducing stress and depression.   Education: Stress, Anxiety, and Depression - Group verbal and visual presentation to define topics covered.  Reviews how body is impacted by stress, anxiety, and depression.  Also discusses healthy ways to reduce stress and to treat/manage anxiety and depression.  Written material given at graduation.   Education: Sleep Hygiene -Provides group verbal and written instruction about how sleep can affect your health.  Define sleep hygiene, discuss sleep cycles and impact of sleep habits. Review good sleep hygiene tips.    Initial Review & Psychosocial Screening:  Initial Psych Review & Screening - 10/10/22 1111       Initial Review   Current issues with None Identified      Family Dynamics   Good Support System? Yes    Comments He can look to his wife for support. Now that his health is back under control he does not have any stressors or deprression.      Barriers   Psychosocial barriers to participate  in program The patient should benefit from training in stress management and relaxation.;There are no identifiable barriers or psychosocial needs.      Screening Interventions   Interventions Encouraged to exercise;To provide support and resources with identified psychosocial needs;Provide feedback about the scores  to participant    Expected Outcomes Short Term goal: Utilizing psychosocial counselor, staff and physician to assist with identification of specific Stressors or current issues interfering with healing process. Setting desired goal for each stressor or current issue identified.;Long Term Goal: Stressors or current issues are controlled or eliminated.;Short Term goal: Identification and review with participant of any Quality of Life or Depression concerns found by scoring the questionnaire.;Long Term goal: The participant improves quality of Life and PHQ9 Scores as seen by post scores and/or verbalization of changes             Quality of Life Scores:   Quality of Life - 01/28/23 1114       Quality of Life Scores   Health/Function Pre 19.12 %    Health/Function Post 27 %    Health/Function % Change 41.21 %    Socioeconomic Pre 27.92 %    Socioeconomic Post 28.75 %    Socioeconomic % Change  2.97 %    Psych/Spiritual Pre 28.07 %    Psych/Spiritual Post 24.29 %    Psych/Spiritual % Change -13.47 %    Family Pre 30 %    Family Post 28.8 %    Family % Change -4 %    GLOBAL Pre 24.6 %    GLOBAL Post 27.02 %    GLOBAL % Change 9.84 %            Scores of 19 and below usually indicate a poorer quality of life in these areas.  A difference of  2-3 points is a clinically meaningful difference.  A difference of 2-3 points in the total score of the Quality of Life Index has been associated with significant improvement in overall quality of life, self-image, physical symptoms, and general health in studies assessing change in quality of life.  PHQ-9: Review Flowsheet  More data exists      10/14/2022 10/11/2022 07/03/2022 10/10/2021 10/09/2021  Depression screen PHQ 2/9  Decreased Interest 0 0 0 0 0 0  Down, Depressed, Hopeless 0 0 0 0 0 0  PHQ - 2 Score 0 0 0 0 0 0  Altered sleeping 1 0 - - 0  Tired, decreased energy 0 0 - - 0  Change in appetite 0 0 - - 0  Feeling bad or failure about  yourself  0 0 - - 0  Trouble concentrating 0 1 - - 0  Moving slowly or fidgety/restless 0 0 - - 0  Suicidal thoughts 0 0 - - 0  PHQ-9 Score 1 1 - - 0  Difficult doing work/chores - Not difficult at all - - Not difficult at all    Details       Multiple values from one day are sorted in reverse-chronological order        Interpretation of Total Score  Total Score Depression Severity:  1-4 = Minimal depression, 5-9 = Mild depression, 10-14 = Moderate depression, 15-19 = Moderately severe depression, 20-27 = Severe depression   Psychosocial Evaluation and Intervention:  Psychosocial Evaluation - 10/10/22 1112       Psychosocial Evaluation & Interventions   Interventions Encouraged to exercise with the program and follow exercise prescription;Relaxation education;Stress management education  Comments He can look to his wife for support. Now that his health is back under control he does not have any stressors or deprression.    Expected Outcomes Short: Start HeartTrack to help with mood. Long: Maintain a healthy mental state    Continue Psychosocial Services  Follow up required by staff             Psychosocial Re-Evaluation:  Psychosocial Re-Evaluation     Row Name 01/09/23 1145 02/11/23 1116           Psychosocial Re-Evaluation   Current issues with None Identified None Identified      Comments No issues reported by Rodney Hall. He is doing well and has support at home Rodney Hall reports no anxiety, depression or stress. Has good support system. His wife helps him stay mentally positive in all situations      Expected Outcomes STG Rodney Hall remains stress/anxiety free. LTG He is able to manage any stress in his life Short: remain stress and anxiety free. Long: maintain postive outlook      Interventions Encouraged to attend Cardiac Rehabilitation for the exercise Encouraged to attend Cardiac Rehabilitation for the exercise      Continue Psychosocial Services  Follow up required by  staff Follow up required by staff               Psychosocial Discharge (Final Psychosocial Re-Evaluation):  Psychosocial Re-Evaluation - 02/11/23 1116       Psychosocial Re-Evaluation   Current issues with None Identified    Comments Rodney Hall reports no anxiety, depression or stress. Has good support system. His wife helps him stay mentally positive in all situations    Expected Outcomes Short: remain stress and anxiety free. Long: maintain postive outlook    Interventions Encouraged to attend Cardiac Rehabilitation for the exercise    Continue Psychosocial Services  Follow up required by staff             Vocational Rehabilitation: Provide vocational rehab assistance to qualifying candidates.   Vocational Rehab Evaluation & Intervention:  Vocational Rehab - 01/28/23 1114       Initial Vocational Rehab Evaluation & Intervention   Assessment shows need for Vocational Rehabilitation No      Discharge Vocational Rehab   Discharge Vocational Rehabilitation retired             Education: Education Goals: Education classes will be provided on a variety of topics geared toward better understanding of heart health and risk factor modification. Participant will state understanding/return demonstration of topics presented as noted by education test scores.  Learning Barriers/Preferences:  Learning Barriers/Preferences - 10/10/22 1110       Learning Barriers/Preferences   Learning Barriers None    Learning Preferences None             General Cardiac Education Topics:  AED/CPR: - Group verbal and written instruction with the use of models to demonstrate the basic use of the AED with the basic ABC's of resuscitation.   Anatomy and Cardiac Procedures: - Group verbal and visual presentation and models provide information about basic cardiac anatomy and function. Reviews the testing methods done to diagnose heart disease and the outcomes of the test results. Describes  the treatment choices: Medical Management, Angioplasty, or Coronary Bypass Surgery for treating various heart conditions including Myocardial Infarction, Angina, Valve Disease, and Cardiac Arrhythmias.  Written material given at graduation. Flowsheet Row Cardiac Rehab from 11/14/2022 in [facility] Cardiac and Pulmonary Rehab  Education need identified 10/14/22  Date 11/14/22  Educator SB  Instruction Review Code 1- Verbalizes Understanding       Medication Safety: - Group verbal and visual instruction to review commonly prescribed medications for heart and lung disease. Reviews the medication, class of the drug, and side effects. Includes the steps to properly store meds and maintain the prescription regimen.  Written material given at graduation.   Intimacy: - Group verbal instruction through game format to discuss how heart and lung disease can affect sexual intimacy. Written material given at graduation.. Flowsheet Row Cardiac Rehab from 11/14/2022 in [facility] Cardiac and Pulmonary Rehab  Date 10/17/22  Educator NT  Instruction Review Code 1- Verbalizes Understanding       Know Your Numbers and Heart Failure: - Group verbal and visual instruction to discuss disease risk factors for cardiac and pulmonary disease and treatment options.  Reviews associated critical values for Overweight/Obesity, Hypertension, Cholesterol, and Diabetes.  Discusses basics of heart failure: signs/symptoms and treatments.  Introduces Heart Failure Zone chart for action plan for heart failure.  Written material given at graduation. Flowsheet Row Cardiac Rehab from 11/14/2022 in [facility] Cardiac and Pulmonary Rehab  Education need identified 10/14/22       Infection Prevention: - Provides verbal and written material to individual with discussion of infection control including proper hand washing and proper equipment cleaning during exercise session. Flowsheet Row Cardiac Rehab from 11/14/2022 in [facility] Cardiac and Pulmonary  Rehab  Date 10/14/22  Educator MB  Instruction Review Code 1- Verbalizes Understanding       Falls Prevention: - Provides verbal and written material to individual with discussion of falls prevention and safety. Flowsheet Row Cardiac Rehab from 11/14/2022 in [facility] Cardiac and Pulmonary Rehab  Date 10/14/22  Educator MB  Instruction Review Code 1- Verbalizes Understanding       Other: -Provides group and verbal instruction on various topics (see comments)   Knowledge Questionnaire Score:  Knowledge Questionnaire Score - 01/28/23 1113       Knowledge Questionnaire Score   Pre Score 20/26    Post Score 26/26             Core Components/Risk Factors/Patient Goals at Admission:  Personal Goals and Risk Factors at Admission - 10/14/22 1658       Core Components/Risk Factors/Patient Goals on Admission    Weight Management Yes;Weight Maintenance    Intervention Weight Management: Develop a combined nutrition and exercise program designed to reach desired caloric intake, while maintaining appropriate intake of nutrient and fiber, sodium and fats, and appropriate energy expenditure required for the weight goal.;Weight Management: Provide education and appropriate resources to help participant work on and attain dietary goals.;Weight Management/Obesity: Establish reasonable short term and long term weight goals.    Admit Weight 184 lb 14.4 oz (83.9 kg)    Goal Weight: Short Term 184 lb (83.5 kg)    Goal Weight: Long Term 184 lb (83.5 kg)    Expected Outcomes Short Term: Continue to assess and modify interventions until short term weight is achieved;Long Term: Adherence to nutrition and physical activity/exercise program aimed toward attainment of established weight goal;Weight Maintenance: Understanding of the daily nutrition guidelines, which includes 25-35% calories from fat, 7% or less cal from saturated fats, less than 200mg  cholesterol, less than 1.5gm of sodium, & 5 or more  servings of fruits and vegetables daily;Understanding recommendations for meals to include 15-35% energy as protein, 25-35% energy from fat, 35-60% energy from carbohydrates, less than 200mg  of dietary cholesterol, 20-35 gm  of total fiber daily;Understanding of distribution of calorie intake throughout the day with the consumption of 4-5 meals/snacks    Heart Failure Yes    Intervention Provide a combined exercise and nutrition program that is supplemented with education, support and counseling about heart failure. Directed toward relieving symptoms such as shortness of breath, decreased exercise tolerance, and extremity edema.    Expected Outcomes Improve functional capacity of life;Short term: Attendance in program 2-3 days a week with increased exercise capacity. Reported lower sodium intake. Reported increased fruit and vegetable intake. Reports medication compliance.;Short term: Daily weights obtained and reported for increase. Utilizing diuretic protocols set by physician.;Long term: Adoption of self-care skills and reduction of barriers for early signs and symptoms recognition and intervention leading to self-care maintenance.    Lipids Yes    Intervention Provide education and support for participant on nutrition & aerobic/resistive exercise along with prescribed medications to achieve LDL 70mg , HDL >40mg .    Expected Outcomes Short Term: Participant states understanding of desired cholesterol values and is compliant with medications prescribed. Participant is following exercise prescription and nutrition guidelines.;Long Term: Cholesterol controlled with medications as prescribed, with individualized exercise RX and with personalized nutrition plan. Value goals: LDL < 70mg , HDL > 40 mg.             Education:Diabetes - Individual verbal and written instruction to review signs/symptoms of diabetes, desired ranges of glucose level fasting, after meals and with exercise. Acknowledge that pre  and post exercise glucose checks will be done for 3 sessions at entry of program.   Core Components/Risk Factors/Patient Goals Review:   Goals and Risk Factor Review     Row Name 01/09/23 1150 02/11/23 1121           Core Components/Risk Factors/Patient Goals Review   Personal Goals Review Weight Management/Obesity;Heart Failure;Lipids Weight Management/Obesity;Heart Failure;Lipids      Review Rodney Hall has kept his weight in the range he prefers. He works with his dialysis team to watch his weight. He is taking his prescribed meds and is working with a RD to manage his health. HE stated he is eating fruit and vegs as told. HIs last visit with nephrologist: was tole his kidneys are stable and all was excellent He rerports he has lost some weight in the past year since starting a heart healthy kidney friendly diet. He likes fruits and veggies, including them at meal when able. Continue with support team to help improve kidney and heart health      Expected Outcomes STG continue to work with dialysis team on weight and risk factor control while in program. LTG Maintian his nutrition, meds and exercise to keep healthy Short: conitnue to work with health team and stick to diet. Long: follow heart and kidney lifestyle changes               Core Components/Risk Factors/Patient Goals at Discharge (Final Review):   Goals and Risk Factor Review - 02/11/23 1121       Core Components/Risk Factors/Patient Goals Review   Personal Goals Review Weight Management/Obesity;Heart Failure;Lipids    Review He rerports he has lost some weight in the past year since starting a heart healthy kidney friendly diet. He likes fruits and veggies, including them at meal when able. Continue with support team to help improve kidney and heart health    Expected Outcomes Short: conitnue to work with health team and stick to diet. Long: follow heart and kidney lifestyle changes  ITP Comments:  ITP  Comments     Row Name 10/10/22 1110 10/14/22 1611 10/14/22 1705 10/15/22 1131 11/06/22 0913   ITP Comments Virtual Visit completed. Patient informed on EP and RD appointment and 6 Minute walk test. Patient also informed of patient health questionnaires on My Chart. Patient Verbalizes understanding. Visit diagnosis can be found in [facility] 09/10/2022. Completed and gym orientation. Initial ITP created and sent for review to Dr. Bethann Punches, Medical Director. Completed and gym orientation. Initial ITP created and sent for review to Dr. Bethann Punches, Medical Director. First full day of exercise!  Patient was oriented to gym and equipment including functions, settings, policies, and procedures.  Patient's individual exercise prescription and treatment plan were reviewed.  All starting workloads were established based on the results of the 6 minute walk test done at initial orientation visit.  The plan for exercise progression was also introduced and progression will be customized based on patient's performance and goals. 30 Day review completed. Medical Director ITP review done, changes made as directed, and signed approval by Medical Director.     new to program    Row Name 12/04/22 0944 01/01/23 0829 01/22/23 1119 02/19/23 0932 02/20/23 1106   ITP Comments 30 Day review completed. Medical Director ITP review done, changes made as directed, and signed approval by Medical Director. 30 Day review completed. Medical Director ITP review done, changes made as directed, and signed approval by Medical Director. 30 Day review completed. Medical Director ITP review done, changes made as directed, and signed approval by Medical Director. 30 Day review completed. Medical Director ITP review done, changes made as directed, and signed approval by Medical Director. Emani graduated today from  rehab with 36 sessions completed.  Details of the patient's exercise prescription and what He needs to do in order to continue the  prescription and progress were discussed with patient.  Patient was given a copy of prescription and goals.  Patient verbalized understanding. Rodney Hall plans to continue to exercise by exercising at [employer] using recumbent bike.            Comments: Discharge ITP

## 2023-02-20 NOTE — Progress Notes (Signed)
Discharge Summary:  Rodney Hall.  (DOB: 12-28-1945)  Rodney Hall graduated today from  rehab with 36 sessions completed.  Details of the patient's exercise prescription and what He needs to do in order to continue the prescription and progress were discussed with patient.  Patient was given a copy of prescription and goals.  Patient verbalized understanding. Rodney Hall plans to continue to exercise by exercising at [employer] using recumbent bike.   6 Minute Walk     Row Name 10/14/22 1612 01/23/23 1118       6 Minute Walk   Phase Initial Discharge    Distance 1090 feet 1370 feet    Distance % Change -- 25.7 %    Distance Feet Change -- 280 ft    Walk Time 6 minutes 6 minutes    # of Rest Breaks 0 0    MPH 2.06 2.59    METS 2.24 2.86    RPE 9 11    Perceived Dyspnea  0 0    VO2 Peak 7.82 10.03    Symptoms Yes (comment) Yes (comment)    Comments Back pain 3/10 Back pain 8/10    Resting HR 83 bpm 83 bpm    Resting BP 110/64 122/68    Resting Oxygen Saturation  98 % 96 %    Exercise Oxygen Saturation  during 6 min walk 97 % 90 %    Max Ex. HR 104 bpm 113 bpm    Max Ex. BP 110/62 126/66    2 Minute Post BP 100/72 --

## 2023-02-20 NOTE — Progress Notes (Signed)
Daily Session Note  Patient Details  Name: Rodney Hall. MRN: [id number] Date of Birth: 1945/04/12 Referring Provider:   Flowsheet Row Cardiac Rehab from 10/14/2022 in [facility] Cardiac and Pulmonary Rehab  Referring Provider Dr. Julien Nordmann       Encounter Date: 02/20/2023  Check In:  Session Check In - 02/20/23 1052       Check-In   Supervising physician immediately available to respond to emergencies See telemetry face sheet for immediately available ER MD    Location [facility]-Cardiac & Pulmonary Rehab    Staff Present Rodney Hall, BS, Exercise Physiologist;Rodney Hall, RCP,RRT,BSRT;Rodney Horace, RN Atilano Median, RN, ADN    Virtual Visit No    Medication changes reported     No    Fall or balance concerns reported    No    Warm-up and Cool-down Performed on first and last piece of equipment    Resistance Training Performed Yes    VAD Patient? No    PAD/SET Patient? No      Pain Assessment   Currently in Pain? No/denies                Social History   Tobacco Use  Smoking Status Never   Passive exposure: Never  Smokeless Tobacco Never    Goals Met:  Independence with exercise equipment Exercise tolerated well No report of concerns or symptoms today Strength training completed today  Goals Unmet:  Not Applicable  Comments:  Rodney Hall graduated today from  rehab with 36 sessions completed.  Details of the patient's exercise prescription and what He needs to do in order to continue the prescription and progress were discussed with patient.  Patient was given a copy of prescription and goals.  Patient verbalized understanding. Rodney Hall plans to continue to exercise by exercising at [employer] using recumbent bike.    Dr. Bethann Punches is Medical Director for [facility] Cardiac Rehabilitation.  Dr. Vida Rigger is Medical Director for [facility] Pulmonary Rehabilitation.

## 2023-02-28 ENCOUNTER — Telehealth: Payer: Self-pay

## 2023-02-28 ENCOUNTER — Other Ambulatory Visit: Payer: Self-pay | Admitting: Family Medicine

## 2023-02-28 DIAGNOSIS — Z860100 Personal history of colon polyps, unspecified: Secondary | ICD-10-CM

## 2023-02-28 NOTE — Telephone Encounter (Signed)
Copied from CRM [id number]. Topic: Referral - Request for Referral >> Feb 28, 2023 11:21 AM Patsy Lager T wrote: Did the patient discuss referral with their provider in the last year? Yes  Appointment offered? No  Type of order/referral and detailed reason for visit: Gastroenterologist, colonoscopy  Preference of office, provider, location: unknown  If referral order, have you been seen by this specialty before? No (If Yes, this issue or another issue? When? Where?  Can we respond through MyChart? No  Gastroenterologist retired

## 2023-03-03 ENCOUNTER — Emergency Department
Admission: EM | Admit: 2023-03-03 | Discharge: 2023-03-03 | Disposition: A | Discharge status: Home / Self Care | Payer: HMO | Attending: Emergency Medicine | Admitting: Emergency Medicine

## 2023-03-03 ENCOUNTER — Encounter: Payer: Self-pay | Admitting: Emergency Medicine

## 2023-03-03 ENCOUNTER — Ambulatory Visit (INDEPENDENT_AMBULATORY_CARE_PROVIDER_SITE_OTHER): Payer: HMO | Admitting: Family Medicine

## 2023-03-03 ENCOUNTER — Other Ambulatory Visit: Payer: Self-pay

## 2023-03-03 VITALS — Temp 97.6°F | Ht 70.0 in | Wt 195.0 lb

## 2023-03-03 DIAGNOSIS — Z5321 Procedure and treatment not carried out due to patient leaving prior to being seen by health care provider: Secondary | ICD-10-CM | POA: Diagnosis not present

## 2023-03-03 DIAGNOSIS — K921 Melena: Secondary | ICD-10-CM

## 2023-03-03 DIAGNOSIS — Z951 Presence of aortocoronary bypass graft: Secondary | ICD-10-CM | POA: Insufficient documentation

## 2023-03-03 DIAGNOSIS — K625 Hemorrhage of anus and rectum: Secondary | ICD-10-CM | POA: Diagnosis present

## 2023-03-03 DIAGNOSIS — Z7902 Long term (current) use of antithrombotics/antiplatelets: Secondary | ICD-10-CM | POA: Diagnosis not present

## 2023-03-03 DIAGNOSIS — Z7982 Long term (current) use of aspirin: Secondary | ICD-10-CM | POA: Insufficient documentation

## 2023-03-03 LAB — COMPREHENSIVE METABOLIC PANEL WITH GFR
ALT: 13 U/L (ref 0–44)
AST: 16 U/L (ref 15–41)
Albumin: 3.9 g/dL (ref 3.5–5.0)
Alkaline Phosphatase: 64 U/L (ref 38–126)
Anion gap: 10 (ref 5–15)
BUN: 17 mg/dL (ref 8–23)
CO2: 20 mmol/L — ABNORMAL LOW (ref 22–32)
Calcium: 8.8 mg/dL — ABNORMAL LOW (ref 8.9–10.3)
Chloride: 105 mmol/L (ref 98–111)
Creatinine, Ser: 1.52 mg/dL — ABNORMAL HIGH (ref 0.61–1.24)
GFR, Estimated: 47 mL/min — ABNORMAL LOW
Glucose, Bld: 134 mg/dL — ABNORMAL HIGH (ref 70–99)
Potassium: 3.9 mmol/L (ref 3.5–5.1)
Sodium: 135 mmol/L (ref 135–145)
Total Bilirubin: 0.3 mg/dL
Total Protein: 7.4 g/dL (ref 6.5–8.1)

## 2023-03-03 LAB — CBC WITH DIFFERENTIAL/PLATELET
Abs Immature Granulocytes: 0.03 10*3/uL (ref 0.00–0.07)
Basophils Absolute: 0.1 10*3/uL (ref 0.0–0.1)
Basophils Relative: 1 %
Eosinophils Absolute: 0.2 10*3/uL (ref 0.0–0.5)
Eosinophils Relative: 3 %
HCT: 36.4 % — ABNORMAL LOW (ref 39.0–52.0)
Hemoglobin: 12 g/dL — ABNORMAL LOW (ref 13.0–17.0)
Immature Granulocytes: 0 %
Lymphocytes Relative: 20 %
Lymphs Abs: 1.5 10*3/uL (ref 0.7–4.0)
MCH: 30.8 pg (ref 26.0–34.0)
MCHC: 33 g/dL (ref 30.0–36.0)
MCV: 93.3 fL (ref 80.0–100.0)
Monocytes Absolute: 1 10*3/uL (ref 0.1–1.0)
Monocytes Relative: 14 %
Neutro Abs: 4.5 10*3/uL (ref 1.7–7.7)
Neutrophils Relative %: 62 %
Platelets: 261 10*3/uL (ref 150–400)
RBC: 3.9 MIL/uL — ABNORMAL LOW (ref 4.22–5.81)
RDW: 12.6 % (ref 11.5–15.5)
WBC: 7.3 10*3/uL (ref 4.0–10.5)
nRBC: 0 % (ref 0.0–0.2)

## 2023-03-03 LAB — TYPE AND SCREEN
ABO/RH(D): B NEG
Antibody Screen: NEGATIVE

## 2023-03-03 LAB — LIPASE, BLOOD: Lipase: 41 U/L (ref 11–51)

## 2023-03-03 MED ORDER — PANTOPRAZOLE SODIUM 40 MG PO TBEC: 40.0000 mg | DELAYED_RELEASE_TABLET | Freq: Every day | ORAL | 0 refills | Status: DC

## 2023-03-03 MED ORDER — OMEPRAZOLE 20 MG PO CPDR: 20.0000 mg | DELAYED_RELEASE_CAPSULE | Freq: Two times a day (BID) | ORAL | 0 refills | Status: DC

## 2023-03-03 NOTE — ED Provider Notes (Signed)
[facility] Provider Note    Event Date/Time   First MD Initiated Contact with Patient 03/03/23 1422     (approximate)   History   Blood In Stools   HPI  Rodney Hall. is a 77 y.o. male who comes in with concerns for rectal bleeding.  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  He reports since Thursday he has been having intermittent bright red blood per rectum.  He states his every time he has a bowel movement he reports bright red blood in the toilet bowl as well as with wiping.  He thought that the stool itself did look dark but he denies any other ibuprofen use, alcohol use or other concerns.  Physical Exam   Triage Vital Signs: ED Triage Vitals  Encounter Vitals Group     BP 03/03/23 0929 110/74     Systolic BP Percentile --      Diastolic BP Percentile --      Pulse Rate 03/03/23 0927 82     Resp 03/03/23 0927 20     Temp 03/03/23 0927 97.8 F (36.6 C)     Temp Source 03/03/23 0927 Oral     SpO2 03/03/23 0927 100 %     Weight 03/03/23 0928 188 lb (85.3 kg)     Height 03/03/23 0928 5\' 10"  (1.778 m)     Head Circumference --      Peak Flow --      Pain Score 03/03/23 0927 0     Pain Loc --      Pain Education --      Exclude from Growth Chart --     Most recent vital signs: Vitals:   03/03/23 0927 03/03/23 0929  BP:  110/74  Pulse: 82   Resp: 20   Temp: 97.8 F (36.6 C)   SpO2: 100%      General: Awake, no distress.  CV:  Good peripheral perfusion.  Resp:  Normal effort.  Abd:  No distention.  Soft and nontender Other:  Rectal exam appeared to have brown stool but did have a slight blood tinge noted to it-no obvious hemorrhoid   ED Results / Procedures / Treatments   Labs (all labs ordered are listed, but only abnormal results are displayed) Labs Reviewed  CBC WITH DIFFERENTIAL/PLATELET - Abnormal; Notable for the following components:      Result Value   RBC 3.90 (*)    Hemoglobin  12.0 (*)    HCT 36.4 (*)    All other components within normal limits  COMPREHENSIVE METABOLIC PANEL - Abnormal; Notable for the following components:   CO2 20 (*)    Glucose, Bld 134 (*)    Creatinine, Ser 1.52 (*)    Calcium 8.8 (*)    GFR, Estimated 47 (*)    All other components within normal limits  LIPASE, BLOOD  URINALYSIS, ROUTINE W REFLEX MICROSCOPIC  TYPE AND SCREEN     PROCEDURES:  Critical Care performed: No  Procedures   MEDICATIONS ORDERED IN ED: Medications - No data to display   IMPRESSION / MDM / ASSESSMENT AND PLAN / ED COURSE  I reviewed the triage vital signs and the nursing notes.   Patient's presentation is most consistent with acute presentation with potential threat to life or bodily function.   Patient comes in with rectal bleeding.  Differential includes diverticular, hemorrhoid.  Seems less likely upper GI bleed the  stool that I did get appeared brown in nature just had a slight bright red color to it.  Patient was concern for some orthostatics at the GI office but he reports that he was always had some dizziness with standing after having his CABG he denies this being any different.  He reports that he just had some decreased p.o. intake recently.  He stood up here and he denies any dizziness or symptoms.  We encourage p.o. hydration given IV fluid shortage.  His hemoglobin is 12 which is overall reassuring given this is reportedly been going on for a few days now.  His rectal exam only showed a very tiny amount of bright red blood but nothing that looked like it was actively bleeding.  I discussed the case with Dr. Mia Creek for admission versus outpatient follow-up.  They can follow-up with patient this week in office.  I discussed this with patient and he also preferred to follow-up outpatient rather than be admitted.  He will continue taking his Plavix and aspirin given his recent CABG but he states that he lives very closely and if the bleeding gets  worse he can come back in for recheck of hemoglobin.  At this time he denies any dizziness lightheadedness symptoms of anemia and feels comfortable with discharge home  Dr. Mia Creek did recommend starting a PPI.   FINAL CLINICAL IMPRESSION(S) / ED DIAGNOSES   Final diagnoses:  Rectal bleeding     Rx / DC Orders   ED Discharge Orders          Ordered    pantoprazole (PROTONIX) 40 MG tablet  Daily        03/03/23 1452             Note:  This document was prepared using Dragon voice recognition software and may include unintentional dictation errors.   Concha Se, MD 03/03/23 [phone]

## 2023-03-03 NOTE — Progress Notes (Signed)
Established patient visit   Patient: Rodney Hall.   DOB: 1945-09-12   77 y.o. Male  MRN: [id number] Visit Date: 03/03/2023  Today's healthcare provider: Mila Merry, MD   Chief Complaint  Patient presents with   Blood In Stools    Pt stated--having diarrhea w/ increase dark/blood in the urine, fatigue. 4 days Denied pain   Subjective    Discussed the use of AI scribe software for clinical note transcription with the patient, who gave verbal consent to proceed.  History of Present Illness   The patient, with a history of heart attack and on dual antiplatelet therapy (Plavix and aspirin), presents with a new onset of bloody stools since late Friday evening. The stools are described as dark, loose, and associated with a significant amount of blood, turning the toilet water red. The patient denies any associated abdominal pain or cramping. The frequency of bowel movements has increased to three times a day since the onset of symptoms.  Prior to the onset of these symptoms, the patient reported feeling unwell on Thursday, with a lack of energy, but this improved by Friday. The patient denies any changes in medication, and is not on any anti-inflammatory drugs apart from low-dose aspirin.  The patient also reports intermittent dizziness, which has been a persistent issue since their heart attack, but there has been no change in this symptom recently. The patient has a history of diverticulosis and hemorrhoids, identified on a colonoscopy five years ago, and also had two polyps removed in a colonoscopy several years prior to that.  The patient has been taking over-the-counter antacids as needed for acid reflux symptoms, which have not changed recently. The patient denies any changes in appetite or any swelling in the feet or ankles.       Medications: Outpatient Medications Prior to Visit  Medication Sig   acetaminophen (TYLENOL) 325 MG tablet Take 650 mg by mouth every 6 (six)  hours as needed for moderate pain or headache.   aspirin EC 81 MG tablet Take 1 tablet (81 mg total) by mouth daily. Swallow whole.   atorvastatin (LIPITOR) 10 MG tablet Take 1 tablet (10 mg total) by mouth daily.   clopidogrel (PLAVIX) 75 MG tablet Take 1 tablet (75 mg total) by mouth daily.   imipramine (TOFRANIL) 50 MG tablet Take 1 tablet (50 mg total) by mouth at bedtime.   tamsulosin (FLOMAX) 0.4 MG CAPS capsule Take 1 capsule (0.4 mg total) by mouth daily after supper.   No facility-administered medications prior to visit.   Review of Systems  Constitutional:  Negative for appetite change, chills and fever.  Respiratory:  Positive for shortness of breath. Negative for chest tightness and wheezing.   Cardiovascular:  Negative for chest pain and palpitations.  Gastrointestinal:  Negative for abdominal pain, nausea and vomiting.       Objective    Temp 97.6 F (36.4 C)   Ht 5\' 10"  (1.778 m)   Wt 195 lb (88.5 kg)   SpO2 97%   BMI 27.98 kg/m   Orthostatic Vitals for the past 48 hrs (Last 6 readings):  Patient Position Orthostatic BP Orthostatic Pulse BP Location Cuff Size  03/03/23 0802 Sitting 106/70 -- Left Arm Normal  03/03/23 0831 Standing 91/59 92 Right Arm Normal  03/03/23 0832 Supine 132/71 76 Right Arm Normal     Physical Exam   CARDIOVASCULAR: Normal heart and lung sounds. ABDOMEN: Mild tenderness in upper abdomen upon palpation. RECTAL:  No external hemorrhoids or abnormalities detected upon inspection and palpation. Dark stool. EXTREMITIES: No edema in feet or ankles.   Hemoccult positive      Assessment & Plan        Gastrointestinal Bleeding with orthostatic hypotension  New onset of dark, loose stools with visible blood x 3 days. No associated abdominal pain. History of diverticulosis and hemorrhoids. On aspirin and Plavix for cardiac history. -Potentially hemodynamically unstable with GI bleed Needs stat labs and potentially HD support. Sent to ER  for expedited evaluation and treatment.   Post Myocardial Infarction Completed cardiac rehab. On aspirin and Plavix. Reports intermittent dizziness. -Continue current cardiac medications. -Monitor for worsening dizziness or other cardiac symptoms.         Mila Merry, MD  [facility] [phone] (phone) [phone] (fax)  [facility]

## 2023-03-03 NOTE — Therapy (Signed)
 OUTPATIENT PHYSICAL THERAPY EVALUATION   Patient Name: Rodney Hall. MRN: [id number] DOB:1945-05-31, 77 y.o., male Today's Date: 03/04/2023  END OF SESSION:  PT End of Session - 03/04/23 1018     Visit Number 1    Number of Visits 13    Date for PT Re-Evaluation 05/27/23    Authorization Type [employer] reporting period from 03/04/2023    PT Start Time 0902    PT Stop Time 0947    PT Time Calculation (min) 45 min    Activity Tolerance Patient tolerated treatment well    Behavior During Therapy [facility] for tasks assessed/performed             Past Medical History:  Diagnosis Date   Abdominal mass    Abdominal pain 06/02/2017   Anxiety    Pt. denies   Arthritis    pt. 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   Past Surgical History:  Procedure Laterality Date   ANTERIOR LAT LUMBAR FUSION Right 07/30/2016   Procedure: Right Lateral two-three Lateral transpsoas interbody fusion with lateral plating/Minimally invasive decompression at Lumbar two-three;  Surgeon: Ditty, Morene Hicks, MD;  Location: [facility] OR;  Service: Neurosurgery;  Laterality: Right;   BACK SURGERY  2012   CATARACT EXTRACTION, BILATERAL     CHOLECYSTECTOMY N/A 07/25/2017   Procedure: LAPAROSCOPIC CHOLECYSTECTOMY WITH INTRAOPERATIVE CHOLANGIOGRAM;  Surgeon: Gladis Cough, MD;  Location: [facility] ORS;  Service: General;  Laterality: N/A;   COLONOSCOPY WITH PROPOFOL  N/A 12/12/2017   Procedure: COLONOSCOPY WITH PROPOFOL ;  Surgeon: Viktoria Lamar DASEN, MD;  Location: [facility] ENDOSCOPY;  Service: Endoscopy;  Laterality: N/A;   CORONARY ARTERY BYPASS GRAFT N/A  09/07/2022   Procedure: CORONARY ARTERY BYPASS GRAFTING (CABG) TIMES FIVE, USING LEFT INTERNAL MAMMARY ARTERY AND RIGHT GREATER SAPHENOUS VEIN HARVESTED ENDOSCOPICALLY;  Surgeon: Lucas Dorise POUR, MD;  Location: [facility] OR;  Service: Open Heart Surgery;  Laterality: N/A;   EUS N/A 07/10/2017   Procedure: UPPER ENDOSCOPIC ULTRASOUND (EUS) LINEAR;  Surgeon: Teressa Toribio SQUIBB, MD;  Location: [facility] ENDOSCOPY;  Service: Endoscopy;  Laterality: N/A;   EYE SURGERY     HEMI-MICRODISCECTOMY LUMBAR LAMINECTOMY LEVEL 1  09/2018   [facility]   IABP INSERTION N/A 09/06/2022   Procedure: IABP Insertion;  Surgeon: Jordan, Peter M, MD;  Location: [facility] INVASIVE CV LAB;  Service: Cardiovascular;  Laterality: N/A;   LEFT HEART CATH AND CORONARY ANGIOGRAPHY N/A 09/04/2022   Procedure: LEFT HEART CATH AND CORONARY ANGIOGRAPHY;  Surgeon: Mady Bruckner, MD;  Location: [facility] INVASIVE CV LAB;  Service: Cardiovascular;  Laterality: N/A;   LEFT HEART CATH AND CORONARY ANGIOGRAPHY N/A 09/06/2022   Procedure: LEFT HEART CATH AND CORONARY ANGIOGRAPHY;  Surgeon: Jordan, Peter M, MD;  Location: [facility] INVASIVE CV LAB;  Service: Cardiovascular;  Laterality: N/A;   NECK SURGERY  2000   TEE WITHOUT CARDIOVERSION N/A 09/07/2022   Procedure: TRANSESOPHAGEAL ECHOCARDIOGRAM;  Surgeon: Lucas Dorise POUR, MD;  Location: [facility] OR;  Service: Open Heart Surgery;  Laterality: N/A;   TENNIS ELBOW RELEASE/NIRSCHEL PROCEDURE     TONSILLECTOMY AND ADENOIDECTOMY  Patient Active Problem List   Diagnosis Date Noted   Hyperlipidemia, mixed 11/07/2022   s/p cabg x 5 09/10/2022   Coronary artery disease 09/04/2022   NSTEMI (non-ST elevated myocardial infarction) (HCC) 09/03/2022   Primary hypertension 09/03/2022   Chronic kidney disease, stage 3b (HCC) 10/11/2021   History of spinal fusion 03/13/2020   Hypertriglyceridemia 01/11/2019   History of colon polyps 01/11/2019   Abdominal mass    Impingement syndrome of shoulder region 06/30/2017   Pancreatic  cyst    Benign prostatic hyperplasia with urinary obstruction 04/06/2015   Prediabetes 04/06/2015   Interstitial cystitis 04/06/2015    PCP: Gasper Nancyann BRAVO, MD  REFERRING PROVIDER: Joshua Alm Hamilton, MD (neurosurgery)  REFERRING DIAG: other spondylosis with radiculopathy, lumbosacral region  Rationale for Evaluation and Treatment: Rehabilitation  THERAPY DIAG:  Other low back pain  Neuralgia and neuritis  Difficulty in walking, not elsewhere classified  ONSET DATE: chronic with history of multiple surgeries over the years (at least 10 years ago), but recently has had more pain in the last 3 months prior to PT evaluation.   SUBJECTIVE:                                                                                                                                                                                           SUBJECTIVE STATEMENT: Patient states he is having trouble with pain in his left hip and low back. He states it makes him limp if he walks any distances. He states he has been getting shots in his spine and the last one did not help at all. He states it has been really sore for the last 3 months (since the last shot didn't work). He states he is getting out of options. His doctor suggested dry needling and he met someone that had good results from that. He states he goes to the gym and works outs on the bike for 60 min 6 days a week. He can rides the recumbent bike because he can do that without pain. He states he cannot walk far due to pain. He has previously had pain down the right leg, but it has been resolved after his back surgeries.   He lives with his wife.  Currently his surgeon did an MRI that did not show anything that is operable. He did have a heart attack in July 2024 resulting in a 5x bypass. His heart is doing well now.   Dr. Joshua noted pain is mostly around left glute med, and he did not find anything that requires any type of surgical intervention on  02/18/2023. He recommended referral to pain  management for consideration of medical therapy and possibly injection therapy with medial branch blocks and RFA at L5-S1. He also referred him to physical therapy for dry needling.   PERTINENT HISTORY:  Patient is a 77 y.o. male who presents to outpatient physical therapy with a referral for medical diagnosis other spondylosis with radiculopathy, lumbosacral region. This patient's chief complaints consist of worsening of chronic low back and left glute pain leading to the following functional deficits: difficulty anything that requires standing or walking, using a push mower, cleaning up cars, going to the grocery store/shopping, walking > 5 min, standing > 3 min. Relevant past medical history and comorbidities include  Benign prostatic hyperplasia with urinary obstruction; Prediabetes; Interstitial cystitis; Pancreatic cyst; Impingement syndrome of shoulder region; Abdominal mass; Hypertriglyceridemia; History of colon polyps; History of spinal fusion; Chronic kidney disease, stage 3b (HCC); NSTEMI (non-ST elevated myocardial infarction) (HCC); Primary hypertension; Coronary artery disease; s/p cabg x 5; and Hyperlipidemia, mixed on their problem list. Has PMH of Abdominal pain (06/02/2017), Anxiety, Arthritis, Basal cell carcinoma (01/21/2017), BPH with obstruction/lower urinary tract symptoms,Choledocholithiasis with acute cholecystitis, History of actinic keratoses, Interstitial cystitis, Pancreatic cyst, and Squamous cell carcinoma of skin (08/14/2016).  has a past surgical history that includes Back surgery (2012); Neck surgery (2000); Cataract extraction, bilateral; Tonsillectomy and adenoidectomy; Anterior lat lumbar fusion (Right, 07/30/2016); EUS (N/A, 07/10/2017); Tennis elbow release/nirschel procedure; Eye surgery; Cholecystectomy (N/A, 07/25/2017); Colonoscopy with propofol  (N/A, 12/12/2017); Hemi-microdiscectomy lumbar laminectomy level 1 (09/2018); LEFT  HEART CATH AND CORONARY ANGIOGRAPHY (N/A, 09/04/2022); Coronary artery bypass graft (N/A, 09/07/2022); TEE without cardioversion (N/A, 09/07/2022); LEFT HEART CATH AND CORONARY ANGIOGRAPHY (N/A, 09/06/2022); and IABP Insertion (N/A, 09/06/2022). Patient denies hx of stroke, seizures, lung problems, diabetes, unexplained weight loss, unexplained changes in bowel or bladder problems, unexplained stumbling or dropping things, and osteoporosis  PAIN: Are you having pain? Yes NPRS: Current: 0/10 (sitting),  Best: 0/10, Worst: 8/10. Pain location: left glute and base of spine (see below) Pain description: stabbing pain in left hip, tingling around lateral left hip when irritated, also feels weak there when irritated.  Aggravating factors: walking >5 min, standing >3 Relieving factors: sitting, heat, injections in low back helped for years (but not last injection).     FUNCTIONAL LIMITATIONS: difficulty anything that requires standing or walking, using a push mower, cleaning up cars, going to the grocery store/shopping, walking > 5 min, standing > 3 min.   LEISURE: doing stuff around the house, mowing the yard, cleaning cars up  PRECAUTIONS: None   WEIGHT BEARING RESTRICTIONS: No  FALLS:  Has patient fallen in last 6 months? Yes. Number of falls 2 after heart surgery, but hasn't fallen since and is not worried about it (he got lightheaded)  OCCUPATION: retired technical sales engineer  PLOF:   PATIENT GOALS: would like the pain to go away in my left hip  NEXT MD VISIT: no appointment scheduled currently  OBJECTIVE  DIAGNOSTIC FINDINGS:  Per MD note from 02/18/2023: MRI shows the previous decompressive hemilaminectomy at L5-S1 on the left. There is some slight epidural fibrosis around the nerve root. He maintains good disc space height. No spondylolisthesis. The fusion at L2-3 L3-4 L 4-5 looks solid there is no obvious neural compression at those levels. No adjacent level disease at L1-2. I do think  there is some left facet arthropathy with vacuum phenomenon.  SELF- REPORTED FUNCTION FOTO score: 33/100 (lumbar questionnaire)  OBSERVATION/INSPECTION Posture Posture (standing): mildly forward flexed at low back, flattened lumbar curve, weight shifted  away from L LE, B scapuale mildly elevated and uneven. Anthropometrics Tremor: none Body composition: BMI 27 Functional Mobility Bed mobility: supine <> sit and rolling I Transfers: sit <> stand I Gait: ambulates with stooped posture and antalgic gait favoring L LE, improves posture after initial several steps when ambulating approx 100 feet with no AD.   SPINE MOTION LUMBAR SPINE AROM *Indicates pain Flexion: fingers to mid shin, increased pain in left hip Extension: < 25% feels better Side Flexion:   R 25% increased back pain  L 25% increased L glute pain! Rotation:  R 75%  L 75% increased L glute pain  NEUROLOGICAL Dermatomes L2-S2 appears equal and intact to light touch   PERIPHERAL JOINT MOTION (in degrees) PASSIVE RANGE OF MOTION (PROM) 03/04/2023:  B LE WFL for basic mobility except as below:  R hip: mild flexion restriction, ~5 degrees IR, 0 degrees extension L hip: anterior hip pain with end range flexion, ~10 degrees IR, ~ 10 degrees extension.   MUSCLE PERFORMANCE (MMT):  *Indicates pain 03/04/23 Date Date  Joint/Motion R/L R/L R/L  Hip     Flexion (L1, L2) 4+/5 / /  Abduction 4+/4 / /  Knee     Extension (L3) 5/5 / /  Flexion (S2) 4/4+ / /  Ankle/Foot     Dorsiflexion (L4) 5/5 / /  Great toe extension (L5) B WFL / /  Eversion (S1) 5/5 / /  Plantarflexion (S1) 4+/4+ / /  Comments:  03/04/2023: poor clearance with bilateral heel walks and toe walks with B UE support.   SPECIAL TESTS: LOWER LIMB NEURODYNAMIC TESTS Straight Leg Raise (Sciatic nerve)  R  = positive for pain at proximal posterior thigh and glute, sensitive to foot position  L  = positive for CC, sensitive to foot position  ACCESSORY  MOTION: Localized tenderness and stiffness to CPA to all lumbar segments, especially ~ L2-L5  PALPATION: TTP with strong reproduction of CC at L gluteal notch, also TTP along deep hip rotators/posterior greater trochanter. Minimal tenderness at B glute med.   REPEATED MOTIONS TESTING: Prone press up (unable to get elbows off mat): 1x10 (no change in pain).   FUNCTIONAL/BALANCE TESTS: Single leg stance, firm surface, eyes open: R= 1 seconds, L= 4 seconds  TREATMENT                                                                                                                            education   PATIENT EDUCATION:  Education details:  Education on diagnosis, prognosis, POC, anatomy and physiology of current condition.  Person educated: Patient Education method: Explanation Education comprehension: verbalized understanding and needs further education  HOME EXERCISE PROGRAM: TBD  ASSESSMENT:  CLINICAL IMPRESSION: Patient is a 77 y.o. male referred to outpatient physical therapy with a medical diagnosis of other spondylosis with radiculopathy, lumbosacral region who presents with signs and symptoms consistent with chronic low back pain with referral to left gluteal notch with  positive bilateral SLR suggesting nerve root irritation as a contributing factor. He presents with pattern consistent with symptomatic lumbar stenosis.  Patient presents with significant pain, posture, ROM, joint stiffness, neurodynamic, muscle tension, muscle performance (strength/power/endurance) and activity tolerance impairments that are limiting ability to complete anything that requires standing or walking, using a push mower, cleaning up cars, going to the grocery store/shopping, walking > 5 min, standing > 3 min without difficulty. Patient will benefit from skilled physical therapy intervention to address current body structure impairments and activity limitations to improve function and work towards goals set  in current POC in order to return to prior level of function or maximal functional improvement.   OBJECTIVE IMPAIRMENTS: Abnormal gait, decreased activity tolerance, decreased balance, decreased endurance, decreased knowledge of condition, decreased knowledge of use of DME, decreased mobility, difficulty walking, decreased ROM, decreased strength, hypomobility, impaired perceived functional ability, increased muscle spasms, impaired flexibility, improper body mechanics, postural dysfunction, and pain.   ACTIVITY LIMITATIONS: carrying, lifting, standing, sleeping, stairs, reach over head, locomotion level, and caring for others  PARTICIPATION LIMITATIONS: meal prep, cleaning, laundry, interpersonal relationship, shopping, community activity, yard work, and   difficulty anything that requires standing or walking, using a push mower, cleaning up cars, going to the grocery store/shopping, walking > 5 min, standing > 3 min  PERSONAL FACTORS: Age, Past/current experiences, Time since onset of injury/illness/exacerbation, and 3+ comorbidities:   Benign prostatic hyperplasia with urinary obstruction; Prediabetes; Interstitial cystitis; Pancreatic cyst; Impingement syndrome of shoulder region; Abdominal mass; Hypertriglyceridemia; History of colon polyps; History of spinal fusion; Chronic kidney disease, stage 3b (HCC); NSTEMI (non-ST elevated myocardial infarction) (HCC); Primary hypertension; Coronary artery disease; s/p cabg x 5; and Hyperlipidemia, mixed on their problem list. Has PMH of Abdominal pain (06/02/2017), Anxiety, Arthritis, Basal cell carcinoma (01/21/2017), BPH with obstruction/lower urinary tract symptoms,Choledocholithiasis with acute cholecystitis, History of actinic keratoses, Interstitial cystitis, Pancreatic cyst, and Squamous cell carcinoma of skin (08/14/2016).  has a past surgical history that includes Back surgery (2012); Neck surgery (2000); Cataract extraction, bilateral; Tonsillectomy  and adenoidectomy; Anterior lat lumbar fusion (Right, 07/30/2016); EUS (N/A, 07/10/2017); Tennis elbow release/nirschel procedure; Eye surgery; Cholecystectomy (N/A, 07/25/2017); Colonoscopy with propofol  (N/A, 12/12/2017); Hemi-microdiscectomy lumbar laminectomy level 1 (09/2018); LEFT HEART CATH AND CORONARY ANGIOGRAPHY (N/A, 09/04/2022); Coronary artery bypass graft (N/A, 09/07/2022); TEE without cardioversion (N/A, 09/07/2022); LEFT HEART CATH AND CORONARY ANGIOGRAPHY (N/A, 09/06/2022); and IABP Insertion (N/A, 09/06/2022) are also affecting patient's functional outcome.   REHAB POTENTIAL: Fair due to nature and severity of condition and previously attempted intervention without success.   CLINICAL DECISION MAKING: Evolving/moderate complexity  EVALUATION COMPLEXITY: Moderate   GOALS: Goals reviewed with patient? No  SHORT TERM GOALS: Target date: 03/18/2023  Patient will be independent with initial home exercise program for self-management of symptoms. Baseline: Initial HEP to be provided at visit 2 as appropriate (03/04/23); Goal status: INITIAL   LONG TERM GOALS: Target date: 05/27/2023  Patient will be independent with a long-term home exercise program for self-management of symptoms.  Baseline: Initial HEP to be provided at visit 2 as appropriate (03/04/23); Goal status: INITIAL  2.  Patient will demonstrate improved FOTO to equal or greater than 47 by visit #14 to demonstrate improvement in overall condition and self-reported functional ability.  Baseline: 33 (03/04/23); Goal status: INITIAL  3.  Patient will ambulate 1729 feet during 6 Minute Walk Test which is his age matched norm to demonstrate improved ability to walk community distances and go shopping.  Baseline: to  be tested visit 2 as appropriate (03/04/23); Goal status: INITIAL  4.  Patient will report left glute pain of equal or less than 3/10 with functional activities to improve his ability to perform activities that require  standing and walking.  Baseline: up to 8/10 (03/04/23); Goal status: INITIAL  5.  Patient will demonstrate improvement in Patient Specific Functional Scale (PSFS) of equal or greater than 3 points to reflect clinically significant improvement in patient's most valued functional activities. Baseline: to be measured visit 2 as appropriate (03/04/23); Goal status: INITIAL   PLAN:  PT FREQUENCY: 1-2x/week  PT DURATION: 8-12 weeks  PLANNED INTERVENTIONS: 97164- PT Re-evaluation, 97110-Therapeutic exercises, 97530- Therapeutic activity, [id number]- Neuromuscular re-education, 97535- Self Care, [id number]- Manual therapy, [id number]- Gait training, [phone]- Aquatic Therapy, 97014- Electrical stimulation (unattended), [phone]- Electrical stimulation (manual), Patient/Family education, Balance training, Dry Needling, Joint mobilization, Spinal mobilization, DME instructions, Cryotherapy, and Moist heat.  PLAN FOR NEXT SESSION: Update HEP as appropriate, baseline and PSFS, dry needling to left glute region as appropriate and per patient/MD preference, progressive trunk/LE/functional strengthening and ROM exercises as tolerated. Manual therapy as needed. Education. Trial of assistive device to improve walking distance.   Camie SAUNDERS. Juli, PT, DPT 03/04/23, 8:08 PM  [facility] [address] [place], [place] [address] P: [phone] I F: [phone]

## 2023-03-03 NOTE — Discharge Instructions (Signed)
We discussed discharge versus admission for your rectal bleeding but given no significant bleeding at this time and your hemoglobin was overall reassuring I discussed the case with Dr. Mia Creek who is comfortable following you up in office this week.  If you develop worsening bleeding weakness shortness of breath or any other concerns please return here and we can recheck your hemoglobin here.  He also wanted you to start taking a Protonix to decrease acid.  Avoid any alcohol, ibuprofen.

## 2023-03-03 NOTE — ED Triage Notes (Signed)
PT in with co bloody stools since Friday, no complaints of pain or hx of the same. Pt is currently on plavix.

## 2023-03-04 ENCOUNTER — Encounter: Payer: Self-pay | Admitting: Family Medicine

## 2023-03-04 ENCOUNTER — Ambulatory Visit: Payer: HMO | Attending: Neurological Surgery | Admitting: Physical Therapy

## 2023-03-04 ENCOUNTER — Encounter: Payer: Self-pay | Admitting: Physical Therapy

## 2023-03-04 ENCOUNTER — Other Ambulatory Visit: Payer: Self-pay | Admitting: Family Medicine

## 2023-03-04 DIAGNOSIS — M5459 Other low back pain: Secondary | ICD-10-CM | POA: Insufficient documentation

## 2023-03-04 DIAGNOSIS — R262 Difficulty in walking, not elsewhere classified: Secondary | ICD-10-CM | POA: Insufficient documentation

## 2023-03-04 DIAGNOSIS — M792 Neuralgia and neuritis, unspecified: Secondary | ICD-10-CM | POA: Insufficient documentation

## 2023-03-04 DIAGNOSIS — K921 Melena: Secondary | ICD-10-CM

## 2023-03-17 ENCOUNTER — Encounter: Payer: Self-pay | Admitting: Physical Therapy

## 2023-03-17 ENCOUNTER — Ambulatory Visit: Payer: HMO | Attending: Neurological Surgery | Admitting: Physical Therapy

## 2023-03-17 VITALS — BP 114/57 | HR 78

## 2023-03-17 DIAGNOSIS — Z7901 Long term (current) use of anticoagulants: Secondary | ICD-10-CM | POA: Diagnosis not present

## 2023-03-17 DIAGNOSIS — R937 Abnormal findings on diagnostic imaging of other parts of musculoskeletal system: Secondary | ICD-10-CM | POA: Diagnosis present

## 2023-03-17 DIAGNOSIS — G8929 Other chronic pain: Secondary | ICD-10-CM | POA: Diagnosis not present

## 2023-03-17 DIAGNOSIS — M792 Neuralgia and neuritis, unspecified: Secondary | ICD-10-CM | POA: Diagnosis present

## 2023-03-17 DIAGNOSIS — R262 Difficulty in walking, not elsewhere classified: Secondary | ICD-10-CM | POA: Diagnosis not present

## 2023-03-17 DIAGNOSIS — M961 Postlaminectomy syndrome, not elsewhere classified: Secondary | ICD-10-CM | POA: Insufficient documentation

## 2023-03-17 DIAGNOSIS — G894 Chronic pain syndrome: Secondary | ICD-10-CM | POA: Diagnosis present

## 2023-03-17 DIAGNOSIS — M545 Low back pain, unspecified: Secondary | ICD-10-CM | POA: Diagnosis not present

## 2023-03-17 DIAGNOSIS — M5459 Other low back pain: Secondary | ICD-10-CM | POA: Insufficient documentation

## 2023-03-17 NOTE — Therapy (Signed)
 OUTPATIENT PHYSICAL THERAPY TREATMENT   Patient Name: Rodney Hall. MRN: [id number] DOB:Feb 26, 1946, 78 y.o., male Today's Date: 03/17/2023  END OF SESSION:  PT End of Session - 03/17/23 0948     Visit Number 2    Number of Visits 13    Date for PT Re-Evaluation 05/27/23    Authorization Type [employer] reporting period from 03/04/2023    PT Start Time 0902    PT Stop Time 1010    PT Time Calculation (min) 68 min    Activity Tolerance Patient tolerated treatment well    Behavior During Therapy [facility] for tasks assessed/performed              Past Medical History:  Diagnosis Date   Abdominal mass    Abdominal pain 06/02/2017   Anxiety    Pt. denies   Arthritis    pt. 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   Past Surgical History:  Procedure Laterality Date   ANTERIOR LAT LUMBAR FUSION Right 07/30/2016   Procedure: Right Lateral two-three Lateral transpsoas interbody fusion with lateral plating/Minimally invasive decompression at Lumbar two-three;  Surgeon: Ditty, Morene Hicks, MD;  Location: [facility] OR;  Service: Neurosurgery;  Laterality: Right;   BACK SURGERY  2012   CATARACT EXTRACTION, BILATERAL     CHOLECYSTECTOMY N/A 07/25/2017   Procedure: LAPAROSCOPIC CHOLECYSTECTOMY WITH INTRAOPERATIVE CHOLANGIOGRAM;  Surgeon: Gladis Cough, MD;  Location: [facility] ORS;  Service: General;  Laterality: N/A;   COLONOSCOPY WITH PROPOFOL  N/A 12/12/2017   Procedure: COLONOSCOPY WITH PROPOFOL ;  Surgeon: Viktoria Lamar DASEN, MD;  Location: [facility] ENDOSCOPY;  Service: Endoscopy;  Laterality: N/A;   CORONARY ARTERY BYPASS GRAFT N/A  09/07/2022   Procedure: CORONARY ARTERY BYPASS GRAFTING (CABG) TIMES FIVE, USING LEFT INTERNAL MAMMARY ARTERY AND RIGHT GREATER SAPHENOUS VEIN HARVESTED ENDOSCOPICALLY;  Surgeon: Lucas Dorise POUR, MD;  Location: [facility] OR;  Service: Open Heart Surgery;  Laterality: N/A;   EUS N/A 07/10/2017   Procedure: UPPER ENDOSCOPIC ULTRASOUND (EUS) LINEAR;  Surgeon: Teressa Toribio SQUIBB, MD;  Location: [facility] ENDOSCOPY;  Service: Endoscopy;  Laterality: N/A;   EYE SURGERY     HEMI-MICRODISCECTOMY LUMBAR LAMINECTOMY LEVEL 1  09/2018   [facility]   IABP INSERTION N/A 09/06/2022   Procedure: IABP Insertion;  Surgeon: Jordan, Peter M, MD;  Location: [facility] INVASIVE CV LAB;  Service: Cardiovascular;  Laterality: N/A;   LEFT HEART CATH AND CORONARY ANGIOGRAPHY N/A 09/04/2022   Procedure: LEFT HEART CATH AND CORONARY ANGIOGRAPHY;  Surgeon: Mady Bruckner, MD;  Location: [facility] INVASIVE CV LAB;  Service: Cardiovascular;  Laterality: N/A;   LEFT HEART CATH AND CORONARY ANGIOGRAPHY N/A 09/06/2022   Procedure: LEFT HEART CATH AND CORONARY ANGIOGRAPHY;  Surgeon: Jordan, Peter M, MD;  Location: [facility] INVASIVE CV LAB;  Service: Cardiovascular;  Laterality: N/A;   NECK SURGERY  2000   TEE WITHOUT CARDIOVERSION N/A 09/07/2022   Procedure: TRANSESOPHAGEAL ECHOCARDIOGRAM;  Surgeon: Lucas Dorise POUR, MD;  Location: [facility] OR;  Service: Open Heart Surgery;  Laterality: N/A;   TENNIS ELBOW RELEASE/NIRSCHEL PROCEDURE     TONSILLECTOMY AND ADENOIDECTOMY  Patient Active Problem List   Diagnosis Date Noted   Hyperlipidemia, mixed 11/07/2022   s/p cabg x 5 09/10/2022   Coronary artery disease 09/04/2022   NSTEMI (non-ST elevated myocardial infarction) (HCC) 09/03/2022   Primary hypertension 09/03/2022   Chronic kidney disease, stage 3b (HCC) 10/11/2021   History of spinal fusion 03/13/2020   Hypertriglyceridemia 01/11/2019   History of colon polyps 01/11/2019   Abdominal mass    Impingement syndrome of shoulder region 06/30/2017   Pancreatic  cyst    Benign prostatic hyperplasia with urinary obstruction 04/06/2015   Prediabetes 04/06/2015   Interstitial cystitis 04/06/2015    PCP: Gasper Nancyann BRAVO, MD  REFERRING PROVIDER: Joshua Alm Hamilton, MD (neurosurgery)  REFERRING DIAG: other spondylosis with radiculopathy, lumbosacral region  Rationale for Evaluation and Treatment: Rehabilitation  THERAPY DIAG:  Other low back pain  Neuralgia and neuritis  Difficulty in walking, not elsewhere classified  ONSET DATE: chronic with history of multiple surgeries over the years (at least 10 years ago), but recently has had more pain in the last 3 months prior to PT evaluation.   SUBJECTIVE:                                                                                                                                                                                           PERTINENT HISTORY:  Patient is a 78 y.o. male who presents to outpatient physical therapy with a referral for medical diagnosis other spondylosis with radiculopathy, lumbosacral region. This patient's chief complaints consist of worsening of chronic low back and left glute pain leading to the following functional deficits: difficulty anything that requires standing or walking, using a push mower, cleaning up cars, going to the grocery store/shopping, walking > 5 min, standing > 3 min. Relevant past medical history and comorbidities include  Benign prostatic hyperplasia with urinary obstruction; Prediabetes; Interstitial cystitis; Pancreatic cyst; Impingement syndrome of shoulder region; Abdominal mass; Hypertriglyceridemia; History of colon polyps; History of spinal fusion; Chronic kidney disease, stage 3b (HCC); NSTEMI (non-ST elevated myocardial infarction) (HCC); Primary hypertension; Coronary artery disease; s/p cabg x 5; and Hyperlipidemia, mixed on their problem list. Has PMH of Abdominal pain (06/02/2017), Anxiety, Arthritis, Basal cell carcinoma (01/21/2017), BPH  with obstruction/lower urinary tract symptoms,Choledocholithiasis with acute cholecystitis, History of actinic keratoses, Interstitial cystitis, Pancreatic cyst, and Squamous cell carcinoma of skin (08/14/2016).  has a past surgical history that includes Back surgery (2012); Neck surgery (2000); Cataract extraction, bilateral; Tonsillectomy and adenoidectomy; Anterior lat lumbar fusion (Right, 07/30/2016); EUS (N/A, 07/10/2017); Tennis elbow release/nirschel procedure; Eye surgery; Cholecystectomy (N/A, 07/25/2017); Colonoscopy with propofol  (N/A, 12/12/2017); Hemi-microdiscectomy lumbar laminectomy level 1 (  09/2018); LEFT HEART CATH AND CORONARY ANGIOGRAPHY (N/A, 09/04/2022); Coronary artery bypass graft (N/A, 09/07/2022); TEE without cardioversion (N/A, 09/07/2022); LEFT HEART CATH AND CORONARY ANGIOGRAPHY (N/A, 09/06/2022); and IABP Insertion (N/A, 09/06/2022). Patient denies hx of stroke, seizures, lung problems, diabetes, unexplained weight loss, unexplained changes in bowel or bladder problems, unexplained stumbling or dropping things, and osteoporosis  SUBJECTIVE STATEMENT: Patient states his back and left hip has been feeling about the same since last PT session. He has pain whenever he is standing or walking up to 8-9/10. It starts easing up 1-3 min after sitting down.   He rides a recumbent bike for about 1 hour 5 days a week at the gym. He states he pushes himself.   PAIN: Are you having pain? Yes NPRS: Current: 0/10 (sitting) Pain location: left glute and base of spine    LEISURE: doing stuff around the house, mowing the yard, cleaning cars up  PRECAUTIONS: None  PATIENT GOALS: would like the pain to go away in my left hip  NEXT MD VISIT: no appointment scheduled currently  OBJECTIVE  DIAGNOSTIC FINDINGS:  Per MD note from 02/18/2023: MRI shows the previous decompressive hemilaminectomy at L5-S1 on the left. There is some slight epidural fibrosis around the nerve root. He maintains good disc  space height. No spondylolisthesis. The fusion at L2-3 L3-4 L 4-5 looks solid there is no obvious neural compression at those levels. No adjacent level disease at L1-2. I do think there is some left facet arthropathy with vacuum phenomenon.  Vitals:   03/17/23 0910  BP: (!) 114/57  Pulse: 78  Automatic cuff Unable to get SpO2 as all fingers are cold and blue, left toes checked and WNL (advised patient to inform MD at next visit and keep track of how many days he has blue fingers).   SELF-REPORTED FUNCTION Patient Specific Functional Scale (PSFS)  Walking: 2 Standing: 2 Wash cars: 2 Average: 2/10  FUNCTIONAL/BALANCE TESTS: 6 Minute Walk Test: 1152 feet with no AD, up to 8/10 pain in left glute and developed antalgic gait by end of set.   Walking with rollator: deferred  TREATMENT                                                                                                                            Therapeutic exercise: to centralize symptoms and improve ROM, strength, muscular endurance, and activity tolerance required for successful completion of functional activities.  Vitals measurement for systems review (see above)  6 Minute Walk Test to get baseline (see above)  (Manual therapy / dry needling - see below)  Hooklying left knee to opposite chest glute stretch, 3x1 min   Seated L sciatic nerve glide, slider technique, 1x20  Education on HEP including handout   Therapeutic activities: dynamic activities for functional strengthening and improved functional activity tolerance.  Goblet squat:  1x20 over 17 inch chair, working on form (widened stance, went to brief sit on 17 inch chair, fatigued!),  10#KB.  1 min rest 1x20 with buttocks tap on 17 inch chair with airex pad, 10#KB 1 minute rest 1x15 with buttocks tap on 17 inch chair with airex pad, 10#DB  Pt required multimodal cuing for proper technique and to facilitate improved neuromuscular control, strength, range of  motion, and functional ability resulting in improved performance and form.  Manual therapy: to reduce pain and tissue tension, improve range of motion, neuromodulation, in order to promote improved ability to complete functional activities. PRONE STM including sustained pressure to left glute max and posterior fibers of glute med just lateral and inferior to PSIS.   Trigger Point Dry Needling  Initial Treatment: Pt instructed on Dry Needling rational, procedures, and possible side effects. Pt instructed to expect mild to moderate muscle soreness later in the day and/or into the next day.  Pt instructed in methods to reduce muscle soreness. Pt instructed to continue prescribed HEP. Patient was educated on signs and symptoms of infection and other risk factors and advised to seek medical attention should they occur.  Patient verbalized understanding of these instructions and education.   Patient Verbal Consent Given: Yes Education Handout Provided: Yes Muscles Treated: left glute max and posterior fibers of glute med just lateral and inferior to left PSIS to decrease pain and spasms along patient's left glute region with patient in prone utilizing 1 dry needle(s) .30mm x 60mm with 2 sticks. Electrical Stimulation Performed: No Treatment Response/Outcome: Normal pain response with needle insertion to sensitive muscle   PATIENT EDUCATION:  Education details:  Exercise purpose/form. Self management techniques.  Education on HEP including handout. Dry needling Person educated: Patient Education method: Explanation, Demonstration, Tactile cues, Verbal cues, and Handouts Education comprehension: verbalized understanding, returned demonstration, and needs further education  HOME EXERCISE PROGRAM: Access Code: [email or web address] URL: [email or web address] Date: 03/17/2023 Prepared by: Camie Cleverly  Exercises - Seated Slump Nerve Glide  - 2 x daily - 1 sets - 20 reps - Supine Piriformis  Stretch with Leg Straight  - 2 x daily - 3 sets - 1 minute hold - Goblet Squat with Kettlebell  - 3-4 x weekly - 3 sets - 20 reps  ASSESSMENT:  CLINICAL IMPRESSION: Patient arrives with no significant change in his condition since initial PT evaluation. Vitals were WFL except unable to get SpO2 due to fingers being blue and cold (reported to cardiologist via instant message; cardiologist acknowledged and did not provide any further directions). Patient was limited in by chief complaint (CC) in left buttocks with pain increasing to 8/10 and developing antalgic gait by end of test. He scored 2/10 on the patient specific functional scale, which reflects only 20% confidence in his ability to perform his top 3 most valued tasks. He underwent dry needling to the left glute region today, with strong reproduction of pain during needling and sustained pressure at the fibers of glute max and posterior glute med just lateral and inferior to L PSIS but unclear yet if it will provide any lasting relief. Patient tolerated exercises well and were added to HEP. Plan to trial use of AD for improved pain with walking next session. Patient would benefit from continued management of limiting condition by skilled physical therapist to address remaining impairments and functional limitations to work towards stated goals and return to PLOF or maximal functional independence.    From Initial PT evaluation on 03/03/2024: Patient is a 78 y.o. male referred to outpatient physical therapy with a medical diagnosis of other spondylosis with radiculopathy, lumbosacral  region who presents with signs and symptoms consistent with chronic low back pain with referral to left gluteal notch with positive bilateral SLR suggesting nerve root irritation as a contributing factor. He presents with pattern consistent with symptomatic lumbar stenosis.  Patient presents with significant pain, posture, ROM, joint stiffness, neurodynamic, muscle  tension, muscle performance (strength/power/endurance) and activity tolerance impairments that are limiting ability to complete anything that requires standing or walking, using a push mower, cleaning up cars, going to the grocery store/shopping, walking > 5 min, standing > 3 min without difficulty. Patient will benefit from skilled physical therapy intervention to address current body structure impairments and activity limitations to improve function and work towards goals set in current POC in order to return to prior level of function or maximal functional improvement.   OBJECTIVE IMPAIRMENTS: Abnormal gait, decreased activity tolerance, decreased balance, decreased endurance, decreased knowledge of condition, decreased knowledge of use of DME, decreased mobility, difficulty walking, decreased ROM, decreased strength, hypomobility, impaired perceived functional ability, increased muscle spasms, impaired flexibility, improper body mechanics, postural dysfunction, and pain.   ACTIVITY LIMITATIONS: carrying, lifting, standing, sleeping, stairs, reach over head, locomotion level, and caring for others  PARTICIPATION LIMITATIONS: meal prep, cleaning, laundry, interpersonal relationship, shopping, community activity, yard work, and   difficulty anything that requires standing or walking, using a push mower, cleaning up cars, going to the grocery store/shopping, walking > 5 min, standing > 3 min  PERSONAL FACTORS: Age, Past/current experiences, Time since onset of injury/illness/exacerbation, and 3+ comorbidities:   Benign prostatic hyperplasia with urinary obstruction; Prediabetes; Interstitial cystitis; Pancreatic cyst; Impingement syndrome of shoulder region; Abdominal mass; Hypertriglyceridemia; History of colon polyps; History of spinal fusion; Chronic kidney disease, stage 3b (HCC); NSTEMI (non-ST elevated myocardial infarction) (HCC); Primary hypertension; Coronary artery disease; s/p cabg x 5; and  Hyperlipidemia, mixed on their problem list. Has PMH of Abdominal pain (06/02/2017), Anxiety, Arthritis, Basal cell carcinoma (01/21/2017), BPH with obstruction/lower urinary tract symptoms,Choledocholithiasis with acute cholecystitis, History of actinic keratoses, Interstitial cystitis, Pancreatic cyst, and Squamous cell carcinoma of skin (08/14/2016).  has a past surgical history that includes Back surgery (2012); Neck surgery (2000); Cataract extraction, bilateral; Tonsillectomy and adenoidectomy; Anterior lat lumbar fusion (Right, 07/30/2016); EUS (N/A, 07/10/2017); Tennis elbow release/nirschel procedure; Eye surgery; Cholecystectomy (N/A, 07/25/2017); Colonoscopy with propofol  (N/A, 12/12/2017); Hemi-microdiscectomy lumbar laminectomy level 1 (09/2018); LEFT HEART CATH AND CORONARY ANGIOGRAPHY (N/A, 09/04/2022); Coronary artery bypass graft (N/A, 09/07/2022); TEE without cardioversion (N/A, 09/07/2022); LEFT HEART CATH AND CORONARY ANGIOGRAPHY (N/A, 09/06/2022); and IABP Insertion (N/A, 09/06/2022) are also affecting patient's functional outcome.   REHAB POTENTIAL: Fair due to nature and severity of condition and previously attempted intervention without success.   CLINICAL DECISION MAKING: Evolving/moderate complexity  EVALUATION COMPLEXITY: Moderate   GOALS: Goals reviewed with patient? No  SHORT TERM GOALS: Target date: 03/18/2023  Patient will be independent with initial home exercise program for self-management of symptoms. Baseline: Initial HEP to be provided at visit 2 as appropriate (03/04/23); initial HEP provided at visit #2 (03/17/2023);  Goal status: In progress   LONG TERM GOALS: Target date: 05/27/2023  Patient will be independent with a long-term home exercise program for self-management of symptoms.  Baseline: Initial HEP to be provided at visit 2 as appropriate (03/04/23); Initial HEP to be provided at visit 2 as appropriate (03/04/23); initial HEP provided at visit #2 (03/17/2023);  Goal  status: In progress  2.  Patient will demonstrate improved FOTO to equal or greater than 47 by visit #14  to demonstrate improvement in overall condition and self-reported functional ability.  Baseline: 33 (03/04/23); Goal status: In progress  3.  Patient will ambulate 1729 feet during 6 Minute Walk Test which is his age matched norm to demonstrate improved ability to walk community distances and go shopping.  Baseline: to be tested visit 2 as appropriate (03/04/23); 1152 feet with no AD, up to 8/10 pain in left glute and developed antalgic gait by end of set (03/17/2023);  Goal status: In progress  4.  Patient will report left glute pain of equal or less than 3/10 with functional activities to improve his ability to perform activities that require standing and walking.  Baseline: up to 8/10 (03/04/23); Goal status: In progress  5.  Patient will demonstrate improvement in Patient Specific Functional Scale (PSFS) of equal or greater than 3 points to reflect clinically significant improvement in patient's most valued functional activities. Baseline: to be measured visit 2 as appropriate (03/04/23); 2/10 (03/17/2023) Goal status: In progress   PLAN:  PT FREQUENCY: 1-2x/week  PT DURATION: 8-12 weeks  PLANNED INTERVENTIONS: 97164- PT Re-evaluation, 97110-Therapeutic exercises, 97530- Therapeutic activity, [id number]- Neuromuscular re-education, 97535- Self Care, [id number]- Manual therapy, [id number]- Gait training, [phone]- Aquatic Therapy, 97014- Electrical stimulation (unattended), [id number]- Electrical stimulation (manual), Patient/Family education, Balance training, Dry Needling, Joint mobilization, Spinal mobilization, DME instructions, Cryotherapy, and Moist heat.  PLAN FOR NEXT SESSION: Update HEP as appropriate, trial use of AD for improved walking comfort. dry needling to left glute region as appropriate and per patient/MD preference, progressive trunk/LE/functional strengthening and ROM exercises as  tolerated. Manual therapy as needed. Education.   Camie SAUNDERS. Juli, PT, DPT 03/17/23, 11:11 AM  [facility] [address] [place], [place] [address] P: [phone] I F: [phone]

## 2023-03-17 NOTE — Patient Instructions (Signed)

## 2023-03-19 ENCOUNTER — Ambulatory Visit: Payer: HMO | Admitting: Physical Therapy

## 2023-03-20 ENCOUNTER — Ambulatory Visit: Payer: HMO | Admitting: Physical Therapy

## 2023-03-20 ENCOUNTER — Encounter: Payer: Self-pay | Admitting: Physical Therapy

## 2023-03-20 DIAGNOSIS — M792 Neuralgia and neuritis, unspecified: Secondary | ICD-10-CM

## 2023-03-20 DIAGNOSIS — R262 Difficulty in walking, not elsewhere classified: Secondary | ICD-10-CM

## 2023-03-20 DIAGNOSIS — M5459 Other low back pain: Secondary | ICD-10-CM

## 2023-03-20 NOTE — Therapy (Signed)
OUTPATIENT PHYSICAL THERAPY TREATMENT   Patient Name: Rodney Hall. MRN: [id number] DOB:28-May-1945, 78 y.o., male Today's Date: 03/20/2023  END OF SESSION:  PT End of Session - 03/20/23 1309     Visit Number 3    Number of Visits 13    Date for PT Re-Evaluation 05/27/23    Authorization Type [employer] reporting period from 03/04/2023    PT Start Time 1123    PT Stop Time 1207    PT Time Calculation (min) 44 min    Activity Tolerance Patient tolerated treatment well    Behavior During Therapy [facility] for tasks assessed/performed               Past Medical History:  Diagnosis Date   Abdominal mass    Abdominal pain 06/02/2017   Anxiety    Pt. denies   Arthritis    pt. 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   Past Surgical History:  Procedure Laterality Date   ANTERIOR LAT LUMBAR FUSION Right 07/30/2016   Procedure: Right Lateral two-three Lateral transpsoas interbody fusion with lateral plating/Minimally invasive decompression at Lumbar two-three;  Surgeon: Ditty, Loura Halt, MD;  Location: [facility] OR;  Service: Neurosurgery;  Laterality: Right;   BACK SURGERY  2012   CATARACT EXTRACTION, BILATERAL     CHOLECYSTECTOMY N/A 07/25/2017   Procedure: LAPAROSCOPIC CHOLECYSTECTOMY WITH INTRAOPERATIVE CHOLANGIOGRAM;  Surgeon: Luretha Murphy, MD;  Location: [facility] ORS;  Service: General;  Laterality: N/A;   COLONOSCOPY WITH PROPOFOL N/A 12/12/2017   Procedure: COLONOSCOPY WITH PROPOFOL;  Surgeon: Scot Jun, MD;  Location: [facility] ENDOSCOPY;  Service: Endoscopy;  Laterality: N/A;   CORONARY ARTERY BYPASS GRAFT N/A  09/07/2022   Procedure: CORONARY ARTERY BYPASS GRAFTING (CABG) TIMES FIVE, USING LEFT INTERNAL MAMMARY ARTERY AND RIGHT GREATER SAPHENOUS VEIN HARVESTED ENDOSCOPICALLY;  Surgeon: [name], MD;  Location: [facility] OR;  Service: Open Heart Surgery;  Laterality: N/A;   EUS N/A 07/10/2017   Procedure: UPPER ENDOSCOPIC ULTRASOUND (EUS) LINEAR;  Surgeon: Rachael Fee, MD;  Location: [facility] ENDOSCOPY;  Service: Endoscopy;  Laterality: N/A;   EYE SURGERY     HEMI-MICRODISCECTOMY LUMBAR LAMINECTOMY LEVEL 1  09/2018   [facility]   IABP INSERTION N/A 09/06/2022   Procedure: IABP Insertion;  Surgeon: Swaziland, Peter M, MD;  Location: [facility] INVASIVE CV LAB;  Service: Cardiovascular;  Laterality: N/A;   LEFT HEART CATH AND CORONARY ANGIOGRAPHY N/A 09/04/2022   Procedure: LEFT HEART CATH AND CORONARY ANGIOGRAPHY;  Surgeon: Yvonne Kendall, MD;  Location: [facility] INVASIVE CV LAB;  Service: Cardiovascular;  Laterality: N/A;   LEFT HEART CATH AND CORONARY ANGIOGRAPHY N/A 09/06/2022   Procedure: LEFT HEART CATH AND CORONARY ANGIOGRAPHY;  Surgeon: Swaziland, Peter M, MD;  Location: [facility] INVASIVE CV LAB;  Service: Cardiovascular;  Laterality: N/A;   NECK SURGERY  2000   TEE WITHOUT CARDIOVERSION N/A 09/07/2022   Procedure: TRANSESOPHAGEAL ECHOCARDIOGRAM;  Surgeon: [name], MD;  Location: [facility] OR;  Service: Open Heart Surgery;  Laterality: N/A;   TENNIS ELBOW RELEASE/NIRSCHEL PROCEDURE     TONSILLECTOMY AND ADENOIDECTOMY  Patient Active Problem List   Diagnosis Date Noted   Hyperlipidemia, mixed 11/07/2022   s/p cabg x 5 09/10/2022   Coronary artery disease 09/04/2022   NSTEMI (non-ST elevated myocardial infarction) (HCC) 09/03/2022   Primary hypertension 09/03/2022   Chronic kidney disease, stage 3b (HCC) 10/11/2021   History of spinal fusion 03/13/2020   Hypertriglyceridemia 01/11/2019   History of colon polyps 01/11/2019   Abdominal mass    Impingement syndrome of shoulder region 06/30/2017   Pancreatic  cyst    Benign prostatic hyperplasia with urinary obstruction 04/06/2015   Prediabetes 04/06/2015   Interstitial cystitis 04/06/2015    PCP: Malva Limes, MD  REFERRING PROVIDER: Arman Bogus, MD (neurosurgery)  REFERRING DIAG: other spondylosis with radiculopathy, lumbosacral region  Rationale for Evaluation and Treatment: Rehabilitation  THERAPY DIAG:  Other low back pain  Neuralgia and neuritis  Difficulty in walking, not elsewhere classified  ONSET DATE: chronic with history of multiple surgeries over the years (at least 10 years ago), but recently has had more pain in the last 3 months prior to PT evaluation.   SUBJECTIVE:                                                                                                                                                                                           PERTINENT HISTORY:  Patient is a 78 y.o. male who presents to outpatient physical therapy with a referral for medical diagnosis other spondylosis with radiculopathy, lumbosacral region. This patient's chief complaints consist of worsening of chronic low back and left glute pain leading to the following functional deficits: difficulty anything that requires standing or walking, using a push mower, cleaning up cars, going to the grocery store/shopping, walking > 5 min, standing > 3 min. Relevant past medical history and comorbidities include  Benign prostatic hyperplasia with urinary obstruction; Prediabetes; Interstitial cystitis; Pancreatic cyst; Impingement syndrome of shoulder region; Abdominal mass; Hypertriglyceridemia; History of colon polyps; History of spinal fusion; Chronic kidney disease, stage 3b (HCC); NSTEMI (non-ST elevated myocardial infarction) (HCC); Primary hypertension; Coronary artery disease; s/p cabg x 5; and Hyperlipidemia, mixed on their problem list. Has PMH of Abdominal pain (06/02/2017), Anxiety, Arthritis, Basal cell carcinoma (01/21/2017), BPH  with obstruction/lower urinary tract symptoms,Choledocholithiasis with acute cholecystitis, History of actinic keratoses, Interstitial cystitis, Pancreatic cyst, and Squamous cell carcinoma of skin (08/14/2016).  has a past surgical history that includes Back surgery (2012); Neck surgery (2000); Cataract extraction, bilateral; Tonsillectomy and adenoidectomy; Anterior lat lumbar fusion (Right, 07/30/2016); EUS (N/A, 07/10/2017); Tennis elbow release/nirschel procedure; Eye surgery; Cholecystectomy (N/A, 07/25/2017); Colonoscopy with propofol (N/A, 12/12/2017); Hemi-microdiscectomy lumbar laminectomy level 1 (  09/2018); LEFT HEART CATH AND CORONARY ANGIOGRAPHY (N/A, 09/04/2022); Coronary artery bypass graft (N/A, 09/07/2022); TEE without cardioversion (N/A, 09/07/2022); LEFT HEART CATH AND CORONARY ANGIOGRAPHY (N/A, 09/06/2022); and IABP Insertion (N/A, 09/06/2022). Patient denies hx of stroke, seizures, lung problems, diabetes, unexplained weight loss, unexplained changes in bowel or bladder problems, unexplained stumbling or dropping things, and osteoporosis  SUBJECTIVE STATEMENT: Patient states his back is feeling pretty good today and that it feels about the same as last PT session. He reports feeling "scared to say it feels better" but he did feel relief that lasted for about an hour after dry needling last session. He states not feeling any soreness for day following the dry needling. He reports doing his HEP "by the book" and has been going to the gym to do his exercises and ride the bike. He states the goblet squats have been feeling difficult and he had could only do 15 repetitions in the last set because of the challenge. He reports still having difficulty with standing and walk and sitting is the most comfortable position for his back.    PAIN: Are you having pain? Yes NPRS: Current: 1/10 Pain location: left glute and base of spine    LEISURE: doing stuff around the house, mowing the yard, cleaning cars  up  PRECAUTIONS: None  PATIENT GOALS: "would like the pain to go away in my left hip"  NEXT MD VISIT: no appointment scheduled currently  OBJECTIVE  Walking with rollator: deferred  TREATMENT                                                                                                                            Therapeutic exercise: to centralize symptoms and improve ROM, strength, muscular endurance, and activity tolerance required for successful completion of functional activities.   Supine Lower Trunk Rotation with LE on Stability Ball, 1x20   Seated L sciatic nerve glide, slider technique, 1x20  (Manual therapy / dry needling - see below)  Seated Hamstring Curls  1x20 with 25# 1x20 with 35# (felt like a good weight without doing too much)  Education on HEP including handout    Pt required multimodal cuing for proper technique and to facilitate improved neuromuscular control, strength, range of motion, and functional ability resulting in improved performance and form.  Manual therapy: to reduce pain and tissue tension, improve range of motion, neuromodulation, in order to promote improved ability to complete functional activities. PRONE STM to left glutes, deep hip rotators, piriformis  Trigger Point Dry Needling  Trigger Point Dry Needling Subsequent Treatment:  Instructions provided previously at initial dry needling treatment.  Instructions reviewed, if requested by the patient, prior to subsequent dry needling treatment.   Patient Verbal Consent Given: Yes  Education Handout Provided: Previously Provided  Muscles Treated: 1 dry needle(s) .30mm x 60mm inserted with 2 sticks into left medial glute max muscle and 1 dry needle .30 x inserted with 2 sticks into left piriformis  with patient in prine position to decrease pain and spasms along patient's right posterior hip/glute region.   Electrical Stimulation Performed: No Treatment Response/Outcome: Patient  tolerated well with twitch response at first piriformis needling location. No unexpected response.   Dry needling performed exclusively by Luretha Murphy. Ilsa Iha, PT, DPT who is certified in the technique.    PATIENT EDUCATION:  Education details:  Exercise purpose/form. Self management techniques.  Education on HEP including handout. Dry needling Person educated: Patient Education method: Explanation, Demonstration, Tactile cues, Verbal cues, and Handouts Education comprehension: verbalized understanding, returned demonstration, and needs further education  HOME EXERCISE PROGRAM: Access Code: [email or web address] URL: [email or web address] Date: 03/20/2023 Prepared by: Christie Copley [place]  Exercises - Seated Slump Nerve Glide  - 2 x daily - 1 sets - 20 reps - Supine Piriformis Stretch with Leg Straight  - 2 x daily - 3 sets - 1 minute hold - Goblet Squat with Kettlebell  - 3-4 x weekly - 3 sets - 20 reps - Supine Lower Trunk Rotation with Swiss Ball  - 2 x daily - 1 sets - 20 reps - Hamstring Curl with Weight Machine  - 3-4 x weekly - 3 sets - 20 reps  ASSESSMENT:  CLINICAL IMPRESSION: Patient arrives with no significant change in his condition since last session. He reports continued participation in his HEP. Seated sciatic nerve glides were reviewed today and minimal cuing was provided to correct technique. He reported feeling the stretch, but there was no change in his pain. Supine lower trunk rotations with the stability ball under his lower extremities were added for lower back mobility/core activation and hamstring curls for improving lower extremity functional strength. The weight was progressed from 25# to 35# due to patient not feeling challenged by the initial weight. Patient tolerated new exercises well with no increase in pain or discomfort and were added to HEP. He underwent dry needling to the left glute region today, with reproduction of pain during needling and sustained pressure at  the fibers of glute max and posterior glute med just lateral and inferior to L PSIS but unclear yet if it will provide any lasting relief. He reported 0/10 pain at the end of session in seated position. Plan to trial use of AD for improved pain with walking next session. Patient would benefit from continued management of limiting condition by skilled physical therapist to address remaining impairments and functional limitations to work towards stated goals and return to PLOF or maximal functional independence.    From Initial PT evaluation on 03/03/2024: Patient is a 78 y.o. male referred to outpatient physical therapy with a medical diagnosis of other spondylosis with radiculopathy, lumbosacral region who presents with signs and symptoms consistent with chronic low back pain with referral to left gluteal notch with positive bilateral SLR suggesting nerve root irritation as a contributing factor. He presents with pattern consistent with symptomatic lumbar stenosis.  Patient presents with significant pain, posture, ROM, joint stiffness, neurodynamic, muscle tension, muscle performance (strength/power/endurance) and activity tolerance impairments that are limiting ability to complete anything that requires standing or walking, using a push mower, cleaning up cars, going to the grocery store/shopping, walking > 5 min, standing > 3 min without difficulty. Patient will benefit from skilled physical therapy intervention to address current body structure impairments and activity limitations to improve function and work towards goals set in current POC in order to return to prior level of function or maximal functional improvement.   OBJECTIVE IMPAIRMENTS: Abnormal gait, decreased activity tolerance,  decreased balance, decreased endurance, decreased knowledge of condition, decreased knowledge of use of DME, decreased mobility, difficulty walking, decreased ROM, decreased strength, hypomobility, impaired perceived  functional ability, increased muscle spasms, impaired flexibility, improper body mechanics, postural dysfunction, and pain.   ACTIVITY LIMITATIONS: carrying, lifting, standing, sleeping, stairs, reach over head, locomotion level, and caring for others  PARTICIPATION LIMITATIONS: meal prep, cleaning, laundry, interpersonal relationship, shopping, community activity, yard work, and   difficulty anything that requires standing or walking, using a push mower, cleaning up cars, going to the grocery store/shopping, walking > 5 min, standing > 3 min  PERSONAL FACTORS: Age, Past/current experiences, Time since onset of injury/illness/exacerbation, and 3+ comorbidities:   Benign prostatic hyperplasia with urinary obstruction; Prediabetes; Interstitial cystitis; Pancreatic cyst; Impingement syndrome of shoulder region; Abdominal mass; Hypertriglyceridemia; History of colon polyps; History of spinal fusion; Chronic kidney disease, stage 3b (HCC); NSTEMI (non-ST elevated myocardial infarction) (HCC); Primary hypertension; Coronary artery disease; s/p cabg x 5; and Hyperlipidemia, mixed on their problem list. Has PMH of Abdominal pain (06/02/2017), Anxiety, Arthritis, Basal cell carcinoma (01/21/2017), BPH with obstruction/lower urinary tract symptoms,Choledocholithiasis with acute cholecystitis, History of actinic keratoses, Interstitial cystitis, Pancreatic cyst, and Squamous cell carcinoma of skin (08/14/2016).  has a past surgical history that includes Back surgery (2012); Neck surgery (2000); Cataract extraction, bilateral; Tonsillectomy and adenoidectomy; Anterior lat lumbar fusion (Right, 07/30/2016); EUS (N/A, 07/10/2017); Tennis elbow release/nirschel procedure; Eye surgery; Cholecystectomy (N/A, 07/25/2017); Colonoscopy with propofol (N/A, 12/12/2017); Hemi-microdiscectomy lumbar laminectomy level 1 (09/2018); LEFT HEART CATH AND CORONARY ANGIOGRAPHY (N/A, 09/04/2022); Coronary artery bypass graft (N/A, 09/07/2022); TEE  without cardioversion (N/A, 09/07/2022); LEFT HEART CATH AND CORONARY ANGIOGRAPHY (N/A, 09/06/2022); and IABP Insertion (N/A, 09/06/2022) are also affecting patient's functional outcome.   REHAB POTENTIAL: Fair due to nature and severity of condition and previously attempted intervention without success.   CLINICAL DECISION MAKING: Evolving/moderate complexity  EVALUATION COMPLEXITY: Moderate   GOALS: Goals reviewed with patient? No  SHORT TERM GOALS: Target date: 03/18/2023  Patient will be independent with initial home exercise program for self-management of symptoms. Baseline: Initial HEP to be provided at visit 2 as appropriate (03/04/23); initial HEP provided at visit #2 (03/17/2023);  Goal status: In progress   LONG TERM GOALS: Target date: 05/27/2023  Patient will be independent with a long-term home exercise program for self-management of symptoms.  Baseline: Initial HEP to be provided at visit 2 as appropriate (03/04/23); Initial HEP to be provided at visit 2 as appropriate (03/04/23); initial HEP provided at visit #2 (03/17/2023);  Goal status: In progress  2.  Patient will demonstrate improved FOTO to equal or greater than 47 by visit #14 to demonstrate improvement in overall condition and self-reported functional ability.  Baseline: 33 (03/04/23); Goal status: In progress  3.  Patient will ambulate 1729 feet during 6 Minute Walk Test which is his age matched norm to demonstrate improved ability to walk community distances and go shopping.  Baseline: to be tested visit 2 as appropriate (03/04/23); 1152 feet with no AD, up to 8/10 pain in left glute and developed antalgic gait by end of set (03/17/2023);  Goal status: In progress  4.  Patient will report left glute pain of equal or less than 3/10 with functional activities to improve his ability to perform activities that require standing and walking.  Baseline: up to 8/10 (03/04/23); Goal status: In progress  5.  Patient will  demonstrate improvement in Patient Specific Functional Scale (PSFS) of equal or greater than 3 points  to reflect clinically significant improvement in patient's most valued functional activities. Baseline: to be measured visit 2 as appropriate (03/04/23); 2/10 (03/17/2023) Goal status: In progress   PLAN:  PT FREQUENCY: 1-2x/week  PT DURATION: 8-12 weeks  PLANNED INTERVENTIONS: 97164- PT Re-evaluation, 97110-Therapeutic exercises, 97530- Therapeutic activity, [id number]- Neuromuscular re-education, 97535- Self Care, [id number]- Manual therapy, [id number]- Gait training, [phone]- Aquatic Therapy, 97014- Electrical stimulation (unattended), [phone]- Electrical stimulation (manual), Patient/Family education, Balance training, Dry Needling, Joint mobilization, Spinal mobilization, DME instructions, Cryotherapy, and Moist heat.  PLAN FOR NEXT SESSION: Update HEP as appropriate, trial use of AD for improved walking comfort. dry needling to left glute region as appropriate and per patient/MD preference, progressive trunk/LE/functional strengthening and ROM exercises as tolerated. Manual therapy as needed. Education.   Mikisha Roseland [place], SPT [employer] DPTE  Huntley Dec R. Ilsa Iha, PT, DPT 03/20/23, 2:42 PM  [facility] [address] [place], [place] [address] P: [phone] I F: [phone]

## 2023-03-24 ENCOUNTER — Ambulatory Visit: Payer: HMO | Admitting: Physical Therapy

## 2023-03-24 ENCOUNTER — Encounter: Payer: Self-pay | Admitting: Physical Therapy

## 2023-03-24 DIAGNOSIS — M5459 Other low back pain: Secondary | ICD-10-CM | POA: Diagnosis not present

## 2023-03-24 DIAGNOSIS — R262 Difficulty in walking, not elsewhere classified: Secondary | ICD-10-CM

## 2023-03-24 DIAGNOSIS — M792 Neuralgia and neuritis, unspecified: Secondary | ICD-10-CM

## 2023-03-24 NOTE — Therapy (Signed)
OUTPATIENT PHYSICAL THERAPY TREATMENT   Patient Name: Rodney Hall. MRN: [id number] DOB:1945-10-04, 78 y.o., male Today's Date: 03/24/2023  END OF SESSION:  PT End of Session - 03/24/23 1118     Visit Number 4    Number of Visits 13    Date for PT Re-Evaluation 05/27/23    Authorization Type [employer] reporting period from 03/04/2023    PT Start Time 0902    PT Stop Time 0946    PT Time Calculation (min) 44 min    Equipment Utilized During Treatment Other (comment)   Rollator used during 6 Minute Walk Test   Activity Tolerance Patient tolerated treatment well    Behavior During Therapy [facility] for tasks assessed/performed                Past Medical History:  Diagnosis Date   Abdominal mass    Abdominal pain 06/02/2017   Anxiety    Pt. denies   Arthritis    pt. 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   Past Surgical History:  Procedure Laterality Date   ANTERIOR LAT LUMBAR FUSION Right 07/30/2016   Procedure: Right Lateral two-three Lateral transpsoas interbody fusion with lateral plating/Minimally invasive decompression at Lumbar two-three;  Surgeon: Ditty, Loura Halt, MD;  Location: [facility] OR;  Service: Neurosurgery;  Laterality: Right;   BACK SURGERY  2012   CATARACT EXTRACTION, BILATERAL     CHOLECYSTECTOMY N/A 07/25/2017   Procedure: LAPAROSCOPIC CHOLECYSTECTOMY WITH INTRAOPERATIVE CHOLANGIOGRAM;  Surgeon: Luretha Murphy, MD;  Location: [facility] ORS;  Service: General;  Laterality: N/A;   COLONOSCOPY WITH PROPOFOL N/A 12/12/2017   Procedure: COLONOSCOPY WITH PROPOFOL;  Surgeon: Scot Jun, MD;   Location[facility] ENDOSCOPY;  Service: Endoscopy;  Laterality: N/A;   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   Procedure: CORONARY ARTERY BYPASS GRAFTING (CABG) TIMES FIVE, USING LEFT INTERNAL MAMMARY ARTERY AND RIGHT GREATER SAPHENOUS VEIN HARVESTED ENDOSCOPICALLY;  Surgeon: [name], MD;  Location: [facility] OR;  Service: Open Heart Surgery;  Laterality: N/A;   EUS N/A 07/10/2017   Procedure: UPPER ENDOSCOPIC ULTRASOUND (EUS) LINEAR;  Surgeon: Rachael Fee, MD;  Location: [facility] ENDOSCOPY;  Service: Endoscopy;  Laterality: N/A;   EYE SURGERY     HEMI-MICRODISCECTOMY LUMBAR LAMINECTOMY LEVEL 1  09/2018   [facility]   IABP INSERTION N/A 09/06/2022   Procedure: IABP Insertion;  Surgeon: Swaziland, Peter M, MD;  Location: [facility] INVASIVE CV LAB;  Service: Cardiovascular;  Laterality: N/A;   LEFT HEART CATH AND CORONARY ANGIOGRAPHY N/A 09/04/2022   Procedure: LEFT HEART CATH AND CORONARY ANGIOGRAPHY;  Surgeon: Yvonne Kendall, MD;  Location: [facility] INVASIVE CV LAB;  Service: Cardiovascular;  Laterality: N/A;   LEFT HEART CATH AND CORONARY ANGIOGRAPHY N/A 09/06/2022   Procedure: LEFT HEART CATH AND CORONARY ANGIOGRAPHY;  Surgeon: [place]azil[place], Peter M, MD;  Location: [facility] INVASIVE CV LAB;  Service: Cardiovascular;  Laterality: N/A;   NECK SURGERY  2000   TEE WITHOUT CARDIOVERSION N/A 09/07/2022   Procedure: TRANSESOPHAGEAL ECHOCARDIOGRAM;  Surgeon: [name], MD;  Location: [facility] OR;  Service: Open Heart Surgery;  Laterality: N/A;   TENNIS ELBOW RELEASE/NIRSCHEL PROCEDURE     TONSILLECTOMY AND ADENOIDECTOMY     Patient Active Problem List   Diagnosis Date Noted   Hyperlipidemia, mixed 11/07/2022   s/p cabg x 5 09/10/2022   Coronary artery disease 09/04/2022   NSTEMI (non-ST elevated myocardial infarction) (HCC) 09/03/2022   Primary hypertension 09/03/2022   Chronic kidney disease, stage 3b (HCC) 10/11/2021   History of spinal fusion 03/13/2020   Hypertriglyceridemia 01/11/2019   History of colon  polyps 01/11/2019   Abdominal mass    Impingement syndrome of shoulder region 06/30/2017   Pancreatic cyst    Benign prostatic hyperplasia with urinary obstruction 04/06/2015   Prediabetes 04/06/2015   Interstitial cystitis 04/06/2015    PCP: Malva Limes, MD  REFERRING PROVIDER: Arman Bogus, MD (neurosurgery)  REFERRING DIAG: other spondylosis with radiculopathy, lumbosacral region  Rationale for Evaluation and Treatment: Rehabilitation  THERAPY DIAG:  Other low back pain  Neuralgia and neuritis  Difficulty in walking, not elsewhere classified  ONSET DATE: chronic with history of multiple surgeries over the years (at least 10 years ago), but recently has had more pain in the last 3 months prior to PT evaluation.   SUBJECTIVE:                                                                                                                                                                                           PERTINENT HISTORY:  Patient is a 78 y.o. male who presents to outpatient physical therapy with a referral for medical diagnosis other spondylosis with radiculopathy, lumbosacral region. This patient's chief complaints consist of worsening of chronic low back and left glute pain leading to the following functional deficits: difficulty anything that requires standing or walking, using a push mower, cleaning up cars, going to the grocery store/shopping, walking > 5 min, standing > 3 min. Relevant past medical history and comorbidities include  Benign prostatic hyperplasia with urinary obstruction; Prediabetes; Interstitial cystitis; Pancreatic cyst; Impingement syndrome of shoulder region; Abdominal mass; Hypertriglyceridemia; History of colon polyps; History of spinal fusion; Chronic kidney disease, stage 3b (HCC); NSTEMI (non-ST elevated myocardial infarction) (HCC); Primary hypertension; Coronary artery disease; s/p cabg x 5; and Hyperlipidemia, mixed on their problem  list. Has PMH of Abdominal pain (06/02/2017), Anxiety, Arthritis, Basal cell carcinoma (01/21/2017), BPH with obstruction/lower urinary tract symptoms,Choledocholithiasis with acute cholecystitis, History of actinic keratoses, Interstitial cystitis, Pancreatic cyst, and Squamous cell carcinoma of skin (08/14/2016).  has a past surgical history that includes Back surgery (2012); Neck surgery (2000); Cataract extraction, bilateral; Tonsillectomy and adenoidectomy; Anterior lat lumbar fusion (Right, 07/30/2016); EUS (N/A, 07/10/2017);  Tennis elbow release/nirschel procedure; Eye surgery; Cholecystectomy (N/A, 07/25/2017); Colonoscopy with propofol (N/A, 12/12/2017); Hemi-microdiscectomy lumbar laminectomy level 1 (09/2018); LEFT HEART CATH AND CORONARY ANGIOGRAPHY (N/A, 09/04/2022); Coronary artery bypass graft (N/A, 09/07/2022); TEE without cardioversion (N/A, 09/07/2022); LEFT HEART CATH AND CORONARY ANGIOGRAPHY (N/A, 09/06/2022); and IABP Insertion (N/A, 09/06/2022). Patient denies hx of stroke, seizures, lung problems, diabetes, unexplained weight loss, unexplained changes in bowel or bladder problems, unexplained stumbling or dropping things, and osteoporosis  SUBJECTIVE STATEMENT: Patient arrives to session feeling about the same as usual. He reports feeling noticeably better after last session and he noticed pain getting back to it's usual state yesterday. He states his pain is currently 0/10 while sitting, but feels more pain while walking. Patient reports completing his HEP regularly, but feeling some cramping in his abdomen during sciatic nerve glides. He states he has started doing his goblet squats prior to riding the bike, making the goblet squats easier to complete.    PAIN: Are you having pain?  NPRS: Current: 0/10 while sitting, more discomfort while standing/walking  Pain location: left glute and base of spine    LEISURE: doing stuff around the house, mowing the yard, cleaning cars up  PRECAUTIONS:  None  PATIENT GOALS: "would like the pain to go away in my left hip"  NEXT MD VISIT: no appointment scheduled currently  OBJECTIVE  6 Minute Walk Test with Rollator: 1377 feet Pain increase to 8/10 during test  Pain began to decrease during seated rest   TREATMENT                                                                                                                            Therapeutic exercise: to centralize symptoms and improve ROM, strength, muscular endurance, and activity tolerance required for successful completion of functional activities.   6 Minute walk test with rollator to assess effect on performance with something to lean on (see above).   Supine Lower Trunk Rotation with LE on Stability Ball, 1x20   Seated L sciatic nerve glide, slider technique, 1x20 Pt ed on proper technique to reduce cramping in abdominal muscles   (Manual therapy / dry needling - see below)   Education on HEP    Pt required multimodal cuing for proper technique and to facilitate improved neuromuscular control, strength, range of motion, and functional ability resulting in improved performance and form.  Manual therapy: to reduce pain and tissue tension, improve range of motion, neuromodulation, in order to promote improved ability to complete functional activities. PRONE STM to left glutes, deep hip rotators, piriformis, left paraspinals   Trigger Point Dry Needling  Trigger Point Dry Needling Subsequent Treatment:  Instructions provided previously at initial dry needling treatment.   Instructions reviewed, if requested by the patient, prior to subsequent dry needling treatment.   Patient Verbal Consent Given: Yes   Education Handout Provided: Previously Provided Muscles   Treated: 1 dry needle(s) .30mm x inserted with 1 sticks  into right piriformis/deep hip rotator muscles with patient in prone position to decrease pain and spasms along patient's left glute region.    Electrical Stimulation Performed: No   Treatment Response/Outcome: Patient with concordant pain and twitch response during needling. No unexpected or adverse response noted.   Dry needling performed exclusively by Luretha Murphy. Ilsa Iha, PT, DPT, who is certified in this technique.     PATIENT EDUCATION:  Education details:  Exercise purpose/form. Self management techniques.  Education on HEP including handout. Dry needling Person educated: Patient Education method: Explanation, Demonstration, Tactile cues, Verbal cues, and Handouts Education comprehension: verbalized understanding, returned demonstration, and needs further education  HOME EXERCISE PROGRAM: Access Code: [email or web address] URL: [email or web address] Date: 03/20/2023 Prepared by: Dellis Voght [place]  Exercises - Seated Slump Nerve Glide  - 2 x daily - 1 sets - 20 reps - Supine Piriformis Stretch with Leg Straight  - 2 x daily - 3 sets - 1 minute hold - Goblet Squat with Kettlebell  - 3-4 x weekly - 3 sets - 20 reps - Supine Lower Trunk Rotation with Swiss Ball  - 2 x daily - 1 sets - 20 reps - Hamstring Curl with Weight Machine  - 3-4 x weekly - 3 sets - 20 reps  ASSESSMENT:  CLINICAL IMPRESSION: Patient arrives reporting 2-3 days of improved symptoms following last session.  He reports continued participation in his HEP. The focus of today's session was to continue progressing functional strengthening and mobility of the lumbar spine. Seated sciatic nerve glides were reviewed today with cuing provided to correct technique to reduce abdominal muscle involvement. He reported feeling the stretch in his posterior leg, but was no longer feeling his abdominal muscles activating. Trial use of AD (rollator) during 6 Minute Walk Test to compare with baseline to assess change in pain and walking speed. He was able to walk about 200 more feet with the rollator, but reported equal pain level of 8/10 by end of test that eased quickly with  seated rest. Patient tolerated all other exercises well with minimal to no lasting increase in back pain. Patient underwent dry needling to the left glute region today, with reproduction of pain during needling and sustained pressure of piriformis/deep hip rotator muscles just lateral and inferior to L PSIS. He reported 0/10 pain at the end of session in seated position and mild soreness in standing. Patient would benefit from continued management of limiting condition by skilled physical therapist to address remaining impairments and functional limitations to work towards stated goals and return to PLOF or maximal functional independence.   From Initial PT evaluation on 03/03/2024: Patient is a 78 y.o. male referred to outpatient physical therapy with a medical diagnosis of other spondylosis with radiculopathy, lumbosacral region who presents with signs and symptoms consistent with chronic low back pain with referral to left gluteal notch with positive bilateral SLR suggesting nerve root irritation as a contributing factor. He presents with pattern consistent with symptomatic lumbar stenosis.  Patient presents with significant pain, posture, ROM, joint stiffness, neurodynamic, muscle tension, muscle performance (strength/power/endurance) and activity tolerance impairments that are limiting ability to complete anything that requires standing or walking, using a push mower, cleaning up cars, going to the grocery store/shopping, walking > 5 min, standing > 3 min without difficulty. Patient will benefit from skilled physical therapy intervention to address current body structure impairments and activity limitations to improve function and work towards goals set in current POC in order to return to prior level of function  or maximal functional improvement.   OBJECTIVE IMPAIRMENTS: Abnormal gait, decreased activity tolerance, decreased balance, decreased endurance, decreased knowledge of condition, decreased  knowledge of use of DME, decreased mobility, difficulty walking, decreased ROM, decreased strength, hypomobility, impaired perceived functional ability, increased muscle spasms, impaired flexibility, improper body mechanics, postural dysfunction, and pain.   ACTIVITY LIMITATIONS: carrying, lifting, standing, sleeping, stairs, reach over head, locomotion level, and caring for others  PARTICIPATION LIMITATIONS: meal prep, cleaning, laundry, interpersonal relationship, shopping, community activity, yard work, and   difficulty anything that requires standing or walking, using a push mower, cleaning up cars, going to the grocery store/shopping, walking > 5 min, standing > 3 min  PERSONAL FACTORS: Age, Past/current experiences, Time since onset of injury/illness/exacerbation, and 3+ comorbidities:   Benign prostatic hyperplasia with urinary obstruction; Prediabetes; Interstitial cystitis; Pancreatic cyst; Impingement syndrome of shoulder region; Abdominal mass; Hypertriglyceridemia; History of colon polyps; History of spinal fusion; Chronic kidney disease, stage 3b (HCC); NSTEMI (non-ST elevated myocardial infarction) (HCC); Primary hypertension; Coronary artery disease; s/p cabg x 5; and Hyperlipidemia, mixed on their problem list. Has PMH of Abdominal pain (06/02/2017), Anxiety, Arthritis, Basal cell carcinoma (01/21/2017), BPH with obstruction/lower urinary tract symptoms,Choledocholithiasis with acute cholecystitis, History of actinic keratoses, Interstitial cystitis, Pancreatic cyst, and Squamous cell carcinoma of skin (08/14/2016).  has a past surgical history that includes Back surgery (2012); Neck surgery (2000); Cataract extraction, bilateral; Tonsillectomy and adenoidectomy; Anterior lat lumbar fusion (Right, 07/30/2016); EUS (N/A, 07/10/2017); Tennis elbow release/nirschel procedure; Eye surgery; Cholecystectomy (N/A, 07/25/2017); Colonoscopy with propofol (N/A, 12/12/2017); Hemi-microdiscectomy lumbar  laminectomy level 1 (09/2018); LEFT HEART CATH AND CORONARY ANGIOGRAPHY (N/A, 09/04/2022); Coronary artery bypass graft (N/A, 09/07/2022); TEE without cardioversion (N/A, 09/07/2022); LEFT HEART CATH AND CORONARY ANGIOGRAPHY (N/A, 09/06/2022); and IABP Insertion (N/A, 09/06/2022) are also affecting patient's functional outcome.   REHAB POTENTIAL: Fair due to nature and severity of condition and previously attempted intervention without success.   CLINICAL DECISION MAKING: Evolving/moderate complexity  EVALUATION COMPLEXITY: Moderate   GOALS: Goals reviewed with patient? No  SHORT TERM GOALS: Target date: 03/18/2023  Patient will be independent with initial home exercise program for self-management of symptoms. Baseline: Initial HEP to be provided at visit 2 as appropriate (03/04/23); initial HEP provided at visit #2 (03/17/2023);  Goal status: In progress   LONG TERM GOALS: Target date: 05/27/2023  Patient will be independent with a long-term home exercise program for self-management of symptoms.  Baseline: Initial HEP to be provided at visit 2 as appropriate (03/04/23); Initial HEP to be provided at visit 2 as appropriate (03/04/23); initial HEP provided at visit #2 (03/17/2023);  Goal status: In progress  2.  Patient will demonstrate improved FOTO to equal or greater than 47 by visit #14 to demonstrate improvement in overall condition and self-reported functional ability.  Baseline: 33 (03/04/23); Goal status: In progress  3.  Patient will ambulate 1729 feet during 6 Minute Walk Test which is his age matched norm to demonstrate improved ability to walk community distances and go shopping.  Baseline: to be tested visit 2 as appropriate (03/04/23); 1152 feet with no AD, up to 8/10 pain in left glute and developed antalgic gait by end of set (03/17/2023);  Goal status: In progress  4.  Patient will report left glute pain of equal or less than 3/10 with functional activities to improve his ability to  perform activities that require standing and walking.  Baseline: up to 8/10 (03/04/23); Goal status: In progress  5.  Patient will demonstrate improvement  in Patient Specific Functional Scale (PSFS) of equal or greater than 3 points to reflect clinically significant improvement in patient's most valued functional activities. Baseline: to be measured visit 2 as appropriate (03/04/23); 2/10 (03/17/2023) Goal status: In progress   PLAN:  PT FREQUENCY: 1-2x/week  PT DURATION: 8-12 weeks  PLANNED INTERVENTIONS: 97164- PT Re-evaluation, 97110-Therapeutic exercises, 97530- Therapeutic activity, [id number]- Neuromuscular re-education, 97535- Self Care, 64403- Manual therapy, [id number]- Gait training, [phone]- Aquatic Therapy, 97014- Electrical stimulation (unattended), [phone]- Electrical stimulation (manual), Patient/Family education, Balance training, Dry Needling, Joint mobilization, Spinal mobilization, DME instructions, Cryotherapy, and Moist heat.  PLAN FOR NEXT SESSION: Update HEP as appropriate, trial use of AD for improved walking comfort. dry needling to left glute region as appropriate and per patient/MD preference, progressive trunk/LE/functional strengthening and ROM exercises as tolerated. Manual therapy as needed. Education.   Tyr Franca [place], SPT [employer] DPTE  Huntley Dec R. Ilsa Iha, PT, DPT 03/24/23, 6:55 PM  [facility] [address] [place], [place] [address] P: [phone] I F: [phone]

## 2023-03-26 ENCOUNTER — Encounter: Payer: Self-pay | Admitting: Physical Therapy

## 2023-03-26 ENCOUNTER — Ambulatory Visit: Payer: HMO | Admitting: Physical Therapy

## 2023-03-26 DIAGNOSIS — R262 Difficulty in walking, not elsewhere classified: Secondary | ICD-10-CM

## 2023-03-26 DIAGNOSIS — M5459 Other low back pain: Secondary | ICD-10-CM

## 2023-03-26 DIAGNOSIS — M792 Neuralgia and neuritis, unspecified: Secondary | ICD-10-CM

## 2023-03-26 NOTE — Therapy (Signed)
OUTPATIENT PHYSICAL THERAPY TREATMENT   Patient Name: Rodney Hall. MRN: [id number] DOB:May 31, 1945, 78 y.o., male Today's Date: 03/26/2023  END OF SESSION:  PT End of Session - 03/26/23 1857     Visit Number 5    Number of Visits 13    Date for PT Re-Evaluation 05/27/23    Authorization Type [employer] reporting period from 03/04/2023    PT Start Time 1516    PT Stop Time 1607    PT Time Calculation (min) 51 min    Equipment Utilized During Treatment --    Activity Tolerance Patient tolerated treatment well    Behavior During Therapy WFL for tasks assessed/performed              Past Medical History:  Diagnosis Date   Abdominal mass    Abdominal pain 06/02/2017   Anxiety    Pt. denies   Arthritis    pt. 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   Past Surgical History:  Procedure Laterality Date   ANTERIOR LAT LUMBAR FUSION Right 07/30/2016   Procedure: Right Lateral two-three Lateral transpsoas interbody fusion with lateral plating/Minimally invasive decompression at Lumbar two-three;  Surgeon: Ditty, Loura Halt, MD;  Location: [facility] OR;  Service: Neurosurgery;  Laterality: Right;   BACK SURGERY  2012   CATARACT EXTRACTION, BILATERAL     CHOLECYSTECTOMY N/A 07/25/2017   Procedure: LAPAROSCOPIC CHOLECYSTECTOMY WITH INTRAOPERATIVE CHOLANGIOGRAM;  Surgeon: Luretha Murphy, MD;  Location: [facility] ORS;  Service: General;  Laterality: N/A;   COLONOSCOPY WITH PROPOFOL N/A 12/12/2017   Procedure: COLONOSCOPY WITH PROPOFOL;  Surgeon: Scot Jun, MD;  Location: [facility] ENDOSCOPY;  Service: Endoscopy;  Laterality:  N/A;   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   Procedure: CORONARY ARTERY BYPASS GRAFTING (CABG) TIMES FIVE, USING LEFT INTERNAL MAMMARY ARTERY AND RIGHT GREATER SAPHENOUS VEIN HARVESTED ENDOSCOPICALLY;  Surgeon: [name], MD;  Location: [facility] OR;  Service: Open Heart Surgery;  Laterality: N/A;   EUS N/A 07/10/2017   Procedure: UPPER ENDOSCOPIC ULTRASOUND (EUS) LINEAR;  Surgeon: Rachael Fee, MD;  Location: [facility] ENDOSCOPY;  Service: Endoscopy;  Laterality: N/A;   EYE SURGERY     HEMI-MICRODISCECTOMY LUMBAR LAMINECTOMY LEVEL 1  09/2018   [facility]   IABP INSERTION N/A 09/06/2022   Procedure: IABP Insertion;  Surgeon: Swazil[place], Peter M, MD;  Location: [facility] INVASIVE CV LAB;  Service: Cardiovascular;  Laterality: N/A;   LEFT HEART CATH AND CORONARY ANGIOGRAPHY N/A 09/04/2022   Procedure: LEFT HEART CATH AND CORONARY ANGIOGRAPHY;  Surgeon: Yvonne Kendall, MD;  Location: [facility] INVASIVE CV LAB;  Service: Cardiovascular;  Laterality: N/A;   LEFT HEART CATH AND CORONARY ANGIOGRAPHY N/A 09/06/2022   Procedure: LEFT HEART CATH AND CORONARY ANGIOGRAPHY;  Surgeon: [place], Peter M, MD;  Location: [facility] INVASIVE CV LAB;  Service: Cardiovascular;  Laterality: N/A;   NECK SURGERY  2000   TEE WITHOUT CARDIOVERSION N/A 09/07/2022   Procedure: TRANSESOPHAGEAL ECHOCARDIOGRAM;  Surgeon: [name], MD;  Location: [facility] OR;  Service: Open Heart Surgery;  Laterality: N/A;   TENNIS ELBOW RELEASE/NIRSCHEL PROCEDURE  TONSILLECTOMY AND ADENOIDECTOMY     Patient Active Problem List   Diagnosis Date Noted   Hyperlipidemia, mixed 11/07/2022   s/p cabg x 5 09/10/2022   Coronary artery disease 09/04/2022   NSTEMI (non-ST elevated myocardial infarction) (HCC) 09/03/2022   Primary hypertension 09/03/2022   Chronic kidney disease, stage 3b (HCC) 10/11/2021   History of spinal fusion 03/13/2020   Hypertriglyceridemia 01/11/2019   History of colon polyps 01/11/2019   Abdominal mass    Impingement syndrome of  shoulder region 06/30/2017   Pancreatic cyst    Benign prostatic hyperplasia with urinary obstruction 04/06/2015   Prediabetes 04/06/2015   Interstitial cystitis 04/06/2015    PCP: Malva Limes, MD  REFERRING PROVIDER: Arman Bogus, MD (neurosurgery)  REFERRING DIAG: other spondylosis with radiculopathy, lumbosacral region  Rationale for Evaluation and Treatment: Rehabilitation  THERAPY DIAG:  Other low back pain  Difficulty in walking, not elsewhere classified  Neuralgia and neuritis  ONSET DATE: chronic with history of multiple surgeries over the years (at least 10 years ago), but recently has had more pain in the last 3 months prior to PT evaluation.   SUBJECTIVE:                                                                                                                                                                                           PERTINENT HISTORY:  Patient is a 78 y.o. male who presents to outpatient physical therapy with a referral for medical diagnosis other spondylosis with radiculopathy, lumbosacral region. This patient's chief complaints consist of worsening of chronic low back and left glute pain leading to the following functional deficits: difficulty anything that requires standing or walking, using a push mower, cleaning up cars, going to the grocery store/shopping, walking > 5 min, standing > 3 min. Relevant past medical history and comorbidities include  Benign prostatic hyperplasia with urinary obstruction; Prediabetes; Interstitial cystitis; Pancreatic cyst; Impingement syndrome of shoulder region; Abdominal mass; Hypertriglyceridemia; History of colon polyps; History of spinal fusion; Chronic kidney disease, stage 3b (HCC); NSTEMI (non-ST elevated myocardial infarction) (HCC); Primary hypertension; Coronary artery disease; s/p cabg x 5; and Hyperlipidemia, mixed on their problem list. Has PMH of Abdominal pain (06/02/2017), Anxiety, Arthritis,  Basal cell carcinoma (01/21/2017), BPH with obstruction/lower urinary tract symptoms,Choledocholithiasis with acute cholecystitis, History of actinic keratoses, Interstitial cystitis, Pancreatic cyst, and Squamous cell carcinoma of skin (08/14/2016).  has a past surgical history that includes Back surgery (2012); Neck surgery (2000); Cataract extraction, bilateral; Tonsillectomy and adenoidectomy; Anterior lat lumbar fusion (Right, 07/30/2016); EUS (N/A, 07/10/2017); Tennis elbow release/nirschel procedure; Eye surgery; Cholecystectomy (N/A, 07/25/2017); Colonoscopy with propofol (  N/A, 12/12/2017); Hemi-microdiscectomy lumbar laminectomy level 1 (09/2018); LEFT HEART CATH AND CORONARY ANGIOGRAPHY (N/A, 09/04/2022); Coronary artery bypass graft (N/A, 09/07/2022); TEE without cardioversion (N/A, 09/07/2022); LEFT HEART CATH AND CORONARY ANGIOGRAPHY (N/A, 09/06/2022); and IABP Insertion (N/A, 09/06/2022). Patient denies hx of stroke, seizures, lung problems, diabetes, unexplained weight loss, unexplained changes in bowel or bladder problems, unexplained stumbling or dropping things, and osteoporosis  SUBJECTIVE STATEMENT: Patient arrives to session feeling good today currently not feeling any pain. He reports feeling better since last session and dry needling treatment. Patient states his ability to walk has gotten easier and he does not feel as much pain while walking. He reports he has been doing his HEP regularly and all exercises are going well. Switching the order and performing his squats prior to riding the bike has been working well of him.   PAIN: Are you having pain?  NPRS: Current: 0/10 while sitting, more discomfort while standing/walking  Pain location: left glute and base of spine    LEISURE: doing stuff around the house, mowing the yard, cleaning cars up  PRECAUTIONS: None  PATIENT GOALS: "would like the pain to go away in my left hip"  NEXT MD VISIT: no appointment scheduled  currently  OBJECTIVE  TREATMENT                                                                                                                            Therapeutic exercise: to centralize symptoms and improve ROM, strength, muscular endurance, and activity tolerance required for successful completion of functional activities.   Supine Sciatic Nerve Tensioner (hamstring stretch with strap around forefoot for increased ankle dorsiflexion) 5 second hold x 10    (Manual therapy / dry needling - see below)  Bridges with toes elevated   1x20   Education on HEP including handout   Therapeutic activities: dynamic activities for functional strengthening and improved functional activity tolerance.   Kickstand RDL with Dynadisc under rear foot    1x15    Discontinued due to improper form and feeling out of breath    Hip hinge drill at wall with PV Stick (butt to wall)    2x10    Patient felt light headed after first set. Dizziness resolved quickly with rest.    Deadlift with 23# bar (bar resting on two 17 inch chairs)    1x15      Pt required multimodal cuing for proper technique and to facilitate improved neuromuscular control, strength, range of motion, and functional ability resulting in improved performance and form.  Manual therapy: to reduce pain and tissue tension, improve range of motion, neuromodulation, in order to promote improved ability to complete functional activities. PRONE STM to left glutes, deep hip rotators, piriformis, left paraspinals  CPA, grade III, a few reps each level to lumbar segments (reproduced left glute pain with CPA to L2-3)  Trigger Point Dry Needling  Trigger Point Dry Needling Subsequent Treatment:  Instructions provided previously at  initial dry needling treatment.  Instructions reviewed, if requested by the patient, prior to subsequent dry needling treatment.   Patient Verbal Consent Given: Yes   Education Handout Provided: Previously  Provided Muscles   Treated: 1 dry needle(s) .30mm x inserted with 1 sticks into glute max muscles just caudal and lateral to L SIJ with patient in prone position to decrease pain and spasms along patient's left glute region.   Electrical Stimulation Performed: No   Treatment Response/Outcome: Twitch response and deep ache as expected. Patient states it hits "the spot." No adverse reactions.   Dry needling performed exclusively by Luretha Murphy. Ilsa Iha, PT, DPT who is certified in this technique    PATIENT EDUCATION:  Education details:  Exercise purpose/form. Self management techniques.  Education on HEP including handout. Dry needling Person educated: Patient Education method: Explanation, Demonstration, Tactile cues, Verbal cues, and Handouts Education comprehension: verbalized understanding, returned demonstration, and needs further education  HOME EXERCISE PROGRAM: Access Code: [email or web address] URL: [email or web address] Date: 03/26/2023 Prepared by: Ronnie Mallette [place]  Exercises - Seated Slump Nerve Glide  - 2 x daily - 1 sets - 20 reps - Supine Piriformis Stretch with Leg Straight  - 2 x daily - 3 sets - 1 minute hold - Goblet Squat with Kettlebell  - 3-4 x weekly - 3 sets - 20 reps - Supine Lower Trunk Rotation with Swiss Ball  - 2 x daily - 1 sets - 20 reps - Hamstring Curl with Weight Machine  - 3-4 x weekly - 3 sets - 20 reps - Supine Hamstring Stretch with Strap  - 2 x daily - 1 sets - 10 reps - 5 seconds hold  ASSESSMENT:  CLINICAL IMPRESSION: Patient arrives to session reporting improvement in back pain and continued good participation in his HEP. The focus of today's session was to continue progressing functional strengthening of the lower extremities/paraspinals and improving mobility of the lumbar spine. Kickstand RDLs were discontinued due to improper form that did not improve with cuing and the patient getting experience shortness of breath during the exercise.  Patient experienced dizziness during hip hinge drill at wall that resolved with rest and stopping the exercise. He was able to complete another set without any repeat bouts of dizziness. Patient reports he has an appointment with his cardiologist soon since he feels he gets winded easily recently. Patient underwent dry needling to the left glute region today, with reproduction of pain during needling and sustained pressure of glute max muscles just caudal and lateral to L SIJ. Patient reports feeling fatigue/soreness in his back/glute muscles but no change in his pain at end of session. He also reports he was no longer short of breath and dizziness was gone at end of session. Patient would benefit from continued management of limiting condition by skilled physical therapist to address remaining impairments and functional limitations to work towards stated goals and return to PLOF or maximal functional independence.   From Initial PT evaluation on 03/03/2024: Patient is a 78 y.o. male referred to outpatient physical therapy with a medical diagnosis of other spondylosis with radiculopathy, lumbosacral region who presents with signs and symptoms consistent with chronic low back pain with referral to left gluteal notch with positive bilateral SLR suggesting nerve root irritation as a contributing factor. He presents with pattern consistent with symptomatic lumbar stenosis.  Patient presents with significant pain, posture, ROM, joint stiffness, neurodynamic, muscle tension, muscle performance (strength/power/endurance) and activity tolerance impairments that are limiting ability to complete anything that  requires standing or walking, using a push mower, cleaning up cars, going to the grocery store/shopping, walking > 5 min, standing > 3 min without difficulty. Patient will benefit from skilled physical therapy intervention to address current body structure impairments and activity limitations to improve function and  work towards goals set in current POC in order to return to prior level of function or maximal functional improvement.   OBJECTIVE IMPAIRMENTS: Abnormal gait, decreased activity tolerance, decreased balance, decreased endurance, decreased knowledge of condition, decreased knowledge of use of DME, decreased mobility, difficulty walking, decreased ROM, decreased strength, hypomobility, impaired perceived functional ability, increased muscle spasms, impaired flexibility, improper body mechanics, postural dysfunction, and pain.   ACTIVITY LIMITATIONS: carrying, lifting, standing, sleeping, stairs, reach over head, locomotion level, and caring for others  PARTICIPATION LIMITATIONS: meal prep, cleaning, laundry, interpersonal relationship, shopping, community activity, yard work, and   difficulty anything that requires standing or walking, using a push mower, cleaning up cars, going to the grocery store/shopping, walking > 5 min, standing > 3 min  PERSONAL FACTORS: Age, Past/current experiences, Time since onset of injury/illness/exacerbation, and 3+ comorbidities:   Benign prostatic hyperplasia with urinary obstruction; Prediabetes; Interstitial cystitis; Pancreatic cyst; Impingement syndrome of shoulder region; Abdominal mass; Hypertriglyceridemia; History of colon polyps; History of spinal fusion; Chronic kidney disease, stage 3b (HCC); NSTEMI (non-ST elevated myocardial infarction) (HCC); Primary hypertension; Coronary artery disease; s/p cabg x 5; and Hyperlipidemia, mixed on their problem list. Has PMH of Abdominal pain (06/02/2017), Anxiety, Arthritis, Basal cell carcinoma (01/21/2017), BPH with obstruction/lower urinary tract symptoms,Choledocholithiasis with acute cholecystitis, History of actinic keratoses, Interstitial cystitis, Pancreatic cyst, and Squamous cell carcinoma of skin (08/14/2016).  has a past surgical history that includes Back surgery (2012); Neck surgery (2000); Cataract extraction,  bilateral; Tonsillectomy and adenoidectomy; Anterior lat lumbar fusion (Right, 07/30/2016); EUS (N/A, 07/10/2017); Tennis elbow release/nirschel procedure; Eye surgery; Cholecystectomy (N/A, 07/25/2017); Colonoscopy with propofol (N/A, 12/12/2017); Hemi-microdiscectomy lumbar laminectomy level 1 (09/2018); LEFT HEART CATH AND CORONARY ANGIOGRAPHY (N/A, 09/04/2022); Coronary artery bypass graft (N/A, 09/07/2022); TEE without cardioversion (N/A, 09/07/2022); LEFT HEART CATH AND CORONARY ANGIOGRAPHY (N/A, 09/06/2022); and IABP Insertion (N/A, 09/06/2022) are also affecting patient's functional outcome.   REHAB POTENTIAL: Fair due to nature and severity of condition and previously attempted intervention without success.   CLINICAL DECISION MAKING: Evolving/moderate complexity  EVALUATION COMPLEXITY: Moderate   GOALS: Goals reviewed with patient? No  SHORT TERM GOALS: Target date: 03/18/2023  Patient will be independent with initial home exercise program for self-management of symptoms. Baseline: Initial HEP to be provided at visit 2 as appropriate (03/04/23); initial HEP provided at visit #2 (03/17/2023);  Goal status: In progress   LONG TERM GOALS: Target date: 05/27/2023  Patient will be independent with a long-term home exercise program for self-management of symptoms.  Baseline: Initial HEP to be provided at visit 2 as appropriate (03/04/23); Initial HEP to be provided at visit 2 as appropriate (03/04/23); initial HEP provided at visit #2 (03/17/2023);  Goal status: In progress  2.  Patient will demonstrate improved FOTO to equal or greater than 47 by visit #14 to demonstrate improvement in overall condition and self-reported functional ability.  Baseline: 33 (03/04/23); Goal status: In progress  3.  Patient will ambulate 1729 feet during 6 Minute Walk Test which is his age matched norm to demonstrate improved ability to walk community distances and go shopping.  Baseline: to be tested visit 2 as  appropriate (03/04/23); 1152 feet with no AD, up to 8/10 pain in  left glute and developed antalgic gait by end of set (03/17/2023);  Goal status: In progress  4.  Patient will report left glute pain of equal or less than 3/10 with functional activities to improve his ability to perform activities that require standing and walking.  Baseline: up to 8/10 (03/04/23); Goal status: In progress  5.  Patient will demonstrate improvement in Patient Specific Functional Scale (PSFS) of equal or greater than 3 points to reflect clinically significant improvement in patient's most valued functional activities. Baseline: to be measured visit 2 as appropriate (03/04/23); 2/10 (03/17/2023) Goal status: In progress   PLAN:  PT FREQUENCY: 1-2x/week  PT DURATION: 8-12 weeks  PLANNED INTERVENTIONS: 97164- PT Re-evaluation, 97110-Therapeutic exercises, 97530- Therapeutic activity, [id number]- Neuromuscular re-education, 97535- Self Care, 95621- Manual therapy, [id number]- Gait training, [id number]- Aquatic Therapy, 97014- Electrical stimulation (unattended), [phone]- Electrical stimulation (manual), Patient/Family education, Balance training, Dry Needling, Joint mobilization, Spinal mobilization, DME instructions, Cryotherapy, and Moist heat.  PLAN FOR NEXT SESSION: Update HEP as appropriate, dry needling to left glute region as appropriate and per patient/MD preference, progressive trunk/LE/functional strengthening and ROM exercises as tolerated. Manual therapy as needed. Education.   Samiel Peel [place], SPT [employer] DPTE  Huntley Dec R. Ilsa Iha, PT, DPT 03/26/23, 7:51 PM  [facility] [address] [place], [place] [address] P: [phone] I F: [id number]

## 2023-03-31 ENCOUNTER — Encounter: Payer: Self-pay | Admitting: Physical Therapy

## 2023-03-31 ENCOUNTER — Ambulatory Visit: Payer: HMO | Admitting: Physical Therapy

## 2023-03-31 DIAGNOSIS — M5459 Other low back pain: Secondary | ICD-10-CM

## 2023-03-31 DIAGNOSIS — M792 Neuralgia and neuritis, unspecified: Secondary | ICD-10-CM

## 2023-03-31 DIAGNOSIS — R262 Difficulty in walking, not elsewhere classified: Secondary | ICD-10-CM

## 2023-03-31 NOTE — Therapy (Signed)
OUTPATIENT PHYSICAL THERAPY TREATMENT   Patient Name: Rodney Hall. MRN: [id number] DOB:[birth year], 78 y.o., male Today's Date: 03/31/2023  END OF SESSION:  PT End of Session - 03/31/23 1140     Visit Number 6    Number of Visits 13    Date for PT Re-Evaluation 05/27/23    Authorization Type [employer] reporting period from 03/04/2023    PT Start Time 1030    PT Stop Time 1118    PT Time Calculation (min) 48 min    Activity Tolerance Patient tolerated treatment well    Behavior During Therapy [facility] for tasks assessed/performed               Past Medical History:  Diagnosis Date   Abdominal mass    Abdominal pain 06/02/2017   Anxiety    Pt. denies   Arthritis    pt. 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   Past Surgical History:  Procedure Laterality Date   ANTERIOR LAT LUMBAR FUSION Right 07/30/2016   Procedure: Right Lateral two-three Lateral transpsoas interbody fusion with lateral plating/Minimally invasive decompression at Lumbar two-three;  Surgeon: Ditty, Loura Halt, MD;  Location: [facility] OR;  Service: Neurosurgery;  Laterality: Right;   BACK SURGERY  2012   CATARACT EXTRACTION, BILATERAL     CHOLECYSTECTOMY N/A 07/25/2017   Procedure: LAPAROSCOPIC CHOLECYSTECTOMY WITH INTRAOPERATIVE CHOLANGIOGRAM;  Surgeon: Luretha Murphy, MD;  Location: [facility] ORS;  Service: General;  Laterality: N/A;   COLONOSCOPY WITH PROPOFOL N/A 12/12/2017   Procedure: COLONOSCOPY WITH PROPOFOL;  Surgeon: Scot Jun, MD;  Location: [facility] ENDOSCOPY;  Service: Endoscopy;  Laterality: N/A;   CORONARY ARTERY BYPASS GRAFT N/A  09/07/2022   Procedure: CORONARY ARTERY BYPASS GRAFTING (CABG) TIMES FIVE, USING LEFT INTERNAL MAMMARY ARTERY AND RIGHT GREATER SAPHENOUS VEIN HARVESTED ENDOSCOPICALLY;  Surgeon: [name], MD;  Location: [facility] OR;  Service: Open Heart Surgery;  Laterality: N/A;   EUS N/A 07/10/2017   Procedure: UPPER ENDOSCOPIC ULTRASOUND (EUS) LINEAR;  Surgeon: Rachael Fee, MD;  Location: [facility] ENDOSCOPY;  Service: Endoscopy;  Laterality: N/A;   EYE SURGERY     HEMI-MICRODISCECTOMY LUMBAR LAMINECTOMY LEVEL 1  09/2018   [facility]   IABP INSERTION N/A 09/06/2022   Procedure: IABP Insertion;  Surgeon: Swaziland, Peter M, MD;  Location: [facility] INVASIVE CV LAB;  Service: Cardiovascular;  Laterality: N/A;   LEFT HEART CATH AND CORONARY ANGIOGRAPHY N/A 09/04/2022   Procedure: LEFT HEART CATH AND CORONARY ANGIOGRAPHY;  Surgeon: Yvonne Kendall, MD;  Location: [facility] INVASIVE CV LAB;  Service: Cardiovascular;  Laterality: N/A;   LEFT HEART CATH AND CORONARY ANGIOGRAPHY N/A 09/06/2022   Procedure: LEFT HEART CATH AND CORONARY ANGIOGRAPHY;  Surgeon: Swaziland, Peter M, MD;  Location: [facility] INVASIVE CV LAB;  Service: Cardiovascular;  Laterality: N/A;   NECK SURGERY  2000   TEE WITHOUT CARDIOVERSION N/A 09/07/2022   Procedure: TRANSESOPHAGEAL ECHOCARDIOGRAM;  Surgeon: [name], MD;  Location: [facility] OR;  Service: Open Heart Surgery;  Laterality: N/A;   TENNIS ELBOW RELEASE/NIRSCHEL PROCEDURE     TONSILLECTOMY AND ADENOIDECTOMY  Patient Active Problem List   Diagnosis Date Noted   Hyperlipidemia, mixed 11/07/2022   s/p cabg x 5 09/10/2022   Coronary artery disease 09/04/2022   NSTEMI (non-ST elevated myocardial infarction) (HCC) 09/03/2022   Primary hypertension 09/03/2022   Chronic kidney disease, stage 3b (HCC) 10/11/2021   History of spinal fusion 03/13/2020   Hypertriglyceridemia 01/11/2019   History of colon polyps 01/11/2019   Abdominal mass    Impingement syndrome of shoulder region 06/30/2017   Pancreatic  cyst    Benign prostatic hyperplasia with urinary obstruction 04/06/2015   Prediabetes 04/06/2015   Interstitial cystitis 04/06/2015    PCP: Malva Limes, MD  REFERRING PROVIDER: Arman Bogus, MD (neurosurgery)  REFERRING DIAG: other spondylosis with radiculopathy, lumbosacral region  Rationale for Evaluation and Treatment: Rehabilitation  THERAPY DIAG:  Other low back pain  Difficulty in walking, not elsewhere classified  Neuralgia and neuritis  ONSET DATE: chronic with history of multiple surgeries over the years (at least 10 years ago), but recently has had more pain in the last 3 months prior to PT evaluation.   SUBJECTIVE:                                                                                                                                                                                           PERTINENT HISTORY:  Patient is a 78 y.o. male who presents to outpatient physical therapy with a referral for medical diagnosis other spondylosis with radiculopathy, lumbosacral region. This patient's chief complaints consist of worsening of chronic low back and left glute pain leading to the following functional deficits: difficulty anything that requires standing or walking, using a push mower, cleaning up cars, going to the grocery store/shopping, walking > 5 min, standing > 3 min. Relevant past medical history and comorbidities include  Benign prostatic hyperplasia with urinary obstruction; Prediabetes; Interstitial cystitis; Pancreatic cyst; Impingement syndrome of shoulder region; Abdominal mass; Hypertriglyceridemia; History of colon polyps; History of spinal fusion; Chronic kidney disease, stage 3b (HCC); NSTEMI (non-ST elevated myocardial infarction) (HCC); Primary hypertension; Coronary artery disease; s/p cabg x 5; and Hyperlipidemia, mixed on their problem list. Has PMH of Abdominal pain (06/02/2017), Anxiety, Arthritis, Basal cell carcinoma (01/21/2017), BPH  with obstruction/lower urinary tract symptoms,Choledocholithiasis with acute cholecystitis, History of actinic keratoses, Interstitial cystitis, Pancreatic cyst, and Squamous cell carcinoma of skin (08/14/2016).  has a past surgical history that includes Back surgery (2012); Neck surgery (2000); Cataract extraction, bilateral; Tonsillectomy and adenoidectomy; Anterior lat lumbar fusion (Right, 07/30/2016); EUS (N/A, 07/10/2017); Tennis elbow release/nirschel procedure; Eye surgery; Cholecystectomy (N/A, 07/25/2017); Colonoscopy with propofol (N/A, 12/12/2017); Hemi-microdiscectomy lumbar laminectomy level 1 (  09/2018); LEFT HEART CATH AND CORONARY ANGIOGRAPHY (N/A, 09/04/2022); Coronary artery bypass graft (N/A, 09/07/2022); TEE without cardioversion (N/A, 09/07/2022); LEFT HEART CATH AND CORONARY ANGIOGRAPHY (N/A, 09/06/2022); and IABP Insertion (N/A, 09/06/2022). Patient denies hx of stroke, seizures, lung problems, diabetes, unexplained weight loss, unexplained changes in bowel or bladder problems, unexplained stumbling or dropping things, and osteoporosis  SUBJECTIVE STATEMENT: Patient arrives to session feeling pretty good today with 2/10 pain in his left low back/glute while standing. He reports feeling good after dry needling last session and was able to walk a lot further over the weekend. He describes feeling tired after new exercises last session but no change in pain. Patient reports continued participation in his HEP and exercises are going well.    PAIN: Are you having pain? Yes NPRS: Current: 2/10 in standing, 0/10 while sitting  Pain location: left glute and base of spine    LEISURE: doing stuff around the house, mowing the yard, cleaning cars up  PRECAUTIONS: None  PATIENT GOALS: "would like the pain to go away in my left hip"  NEXT MD VISIT: no appointment scheduled currently  OBJECTIVE  TREATMENT                                                                                                                             Therapeutic exercise: to centralize symptoms and improve ROM, strength, muscular endurance, and activity tolerance required for successful completion of functional activities.   Bridges with toes elevated   2x20   Tolerance improved with cuing to bring legs in closer    (Manual therapy / dry needling - see below)  Education on HEP including handout   Therapeutic activities: dynamic activities for functional strengthening and improved functional activity tolerance.   Deadlift with 23# bar (bar resting on two 17 inch chairs)    2x15    1 min rest after each set  Feeling winded during exercise. Improved with cuing to not hold breath and focus on deep inhale and exhale throughout exercise.  Goblet Squat with 15# DB  1x20  1x10 (tolerance improved with fewer reps)   1 min rest after each set     Pallof Press    1x10 each side with 10#   Got very winded, discontinued after one set    Seated Row on Theraball    1x20 with 10#       Pt required multimodal cuing for proper technique and to facilitate improved neuromuscular control, strength, range of motion, and functional ability resulting in improved performance and form.  Manual therapy: to reduce pain and tissue tension, improve range of motion, neuromodulation, in order to promote improved ability to complete functional activities. PRONE STM to left glutes, deep hip rotators, piriformis   Trigger Point Dry Needling  Subsequent Treatment: Instructions provided previously at initial dry needling treatment.   Instructions reviewed, if requested by the patient, prior to subsequent dry needling treatment.   Patient Verbal Consent  Given: Yes   Education Handout Provided: Previously   Provided Muscles Treated: 1 dry needle(s) .30mm x inserted with 5 sticks into left posterior/medial glute max/med muscle fibers caudal and lateral to PSIS with patient in prone position to decrease pain and spasms  along patient's left SIJ/glute region.   Electrical Stimulation Performed: No   Treatment Response/Outcome: Minimal response except small twitch response and more concordant pain at last insertion site. No abnormal response noted.   Dry needling performed exclusively by Luretha Murphy. Ilsa Iha, PT, DPT who is certified in this technique    PATIENT EDUCATION:  Education details:  Exercise purpose/form. Self management techniques.  Education on HEP including handout. Dry needling Person educated: Patient Education method: Explanation, Demonstration, Tactile cues, Verbal cues, and Handouts Education comprehension: verbalized understanding, returned demonstration, and needs further education  HOME EXERCISE PROGRAM: Access Code: [email or web address] URL: [email or web address] Date: 03/31/2023 Prepared by: Daila Elbert [place]  Exercises - Seated Slump Nerve Glide  - 2 x daily - 1 sets - 20 reps - Supine Piriformis Stretch with Leg Straight  - 2 x daily - 3 sets - 1 minute hold - Goblet Squat with Kettlebell  - 3-4 x weekly - 3 sets - 20 reps - Supine Lower Trunk Rotation with Swiss Ball  - 2 x daily - 1 sets - 20 reps - Hamstring Curl with Weight Machine  - 3-4 x weekly - 3 sets - 20 reps - Supine Hamstring Stretch with Strap  - 2 x daily - 1 sets - 10 reps - 5 seconds hold - Seated Shoulder Row with Anchored Resistance  - 3 x weekly - 2 sets - 20 reps - Standing Anti-Rotation Press with Anchored Resistance  - 3 x weekly - 2 sets - 10 reps  ASSESSMENT:  CLINICAL IMPRESSION: Patient arrives to session reporting continued improvement in back pain and tolerance to ambulation. The focus of today's session was to continue progressing functional strengthening/endurance of the lower extremities, abdominals, and postural muscles to improve tolerance to functional activities. Tolerance to modified deadlift exercise improved following cuing to avoid holding breath and focus on deep inhale and exhale throughout  exercise. Patient experienced improved tolerance and less shortness of breath during goblet squats with reducing the number of repetitions. Pallof presses discontinued following completion of one set on each side due to significant shortness of breath. Symptoms improved with seated rest. Patient tolerated all other interventions well and was able to complete all other exercises with no increase in pain or discomfort. Patient underwent dry needling to the left posterior/medial glute max/med muscle fibers caudal and lateral to PSIS with patient in prone position to decrease pain and spasms along patient's left SIJ/glute region. Patient provided handout with updated HEP to include well tolerated exercises. Patient reports no longer feeling shortness of breath and dizziness at end of session. Patient would benefit from continued management of limiting condition by skilled physical therapist to address remaining impairments and functional limitations to work towards stated goals and return to PLOF or maximal functional independence.    From Initial PT evaluation on 03/03/2024: Patient is a 78 y.o. male referred to outpatient physical therapy with a medical diagnosis of other spondylosis with radiculopathy, lumbosacral region who presents with signs and symptoms consistent with chronic low back pain with referral to left gluteal notch with positive bilateral SLR suggesting nerve root irritation as a contributing factor. He presents with pattern consistent with symptomatic lumbar stenosis.  Patient presents with significant pain, posture, ROM, joint stiffness,  neurodynamic, muscle tension, muscle performance (strength/power/endurance) and activity tolerance impairments that are limiting ability to complete anything that requires standing or walking, using a push mower, cleaning up cars, going to the grocery store/shopping, walking > 5 min, standing > 3 min without difficulty. Patient will benefit from skilled physical  therapy intervention to address current body structure impairments and activity limitations to improve function and work towards goals set in current POC in order to return to prior level of function or maximal functional improvement.   OBJECTIVE IMPAIRMENTS: Abnormal gait, decreased activity tolerance, decreased balance, decreased endurance, decreased knowledge of condition, decreased knowledge of use of DME, decreased mobility, difficulty walking, decreased ROM, decreased strength, hypomobility, impaired perceived functional ability, increased muscle spasms, impaired flexibility, improper body mechanics, postural dysfunction, and pain.   ACTIVITY LIMITATIONS: carrying, lifting, standing, sleeping, stairs, reach over head, locomotion level, and caring for others  PARTICIPATION LIMITATIONS: meal prep, cleaning, laundry, interpersonal relationship, shopping, community activity, yard work, and   difficulty anything that requires standing or walking, using a push mower, cleaning up cars, going to the grocery store/shopping, walking > 5 min, standing > 3 min  PERSONAL FACTORS: Age, Past/current experiences, Time since onset of injury/illness/exacerbation, and 3+ comorbidities:   Benign prostatic hyperplasia with urinary obstruction; Prediabetes; Interstitial cystitis; Pancreatic cyst; Impingement syndrome of shoulder region; Abdominal mass; Hypertriglyceridemia; History of colon polyps; History of spinal fusion; Chronic kidney disease, stage 3b (HCC); NSTEMI (non-ST elevated myocardial infarction) (HCC); Primary hypertension; Coronary artery disease; s/p cabg x 5; and Hyperlipidemia, mixed on their problem list. Has PMH of Abdominal pain (06/02/2017), Anxiety, Arthritis, Basal cell carcinoma (01/21/2017), BPH with obstruction/lower urinary tract symptoms,Choledocholithiasis with acute cholecystitis, History of actinic keratoses, Interstitial cystitis, Pancreatic cyst, and Squamous cell carcinoma of skin  (08/14/2016).  has a past surgical history that includes Back surgery (2012); Neck surgery (2000); Cataract extraction, bilateral; Tonsillectomy and adenoidectomy; Anterior lat lumbar fusion (Right, 07/30/2016); EUS (N/A, 07/10/2017); Tennis elbow release/nirschel procedure; Eye surgery; Cholecystectomy (N/A, 07/25/2017); Colonoscopy with propofol (N/A, 12/12/2017); Hemi-microdiscectomy lumbar laminectomy level 1 (09/2018); LEFT HEART CATH AND CORONARY ANGIOGRAPHY (N/A, 09/04/2022); Coronary artery bypass graft (N/A, 09/07/2022); TEE without cardioversion (N/A, 09/07/2022); LEFT HEART CATH AND CORONARY ANGIOGRAPHY (N/A, 09/06/2022); and IABP Insertion (N/A, 09/06/2022) are also affecting patient's functional outcome.   REHAB POTENTIAL: Fair due to nature and severity of condition and previously attempted intervention without success.   CLINICAL DECISION MAKING: Evolving/moderate complexity  EVALUATION COMPLEXITY: Moderate   GOALS: Goals reviewed with patient? No  SHORT TERM GOALS: Target date: 03/18/2023  Patient will be independent with initial home exercise program for self-management of symptoms. Baseline: Initial HEP to be provided at visit 2 as appropriate (03/04/23); initial HEP provided at visit #2 (03/17/2023);  Goal status: In progress   LONG TERM GOALS: Target date: 05/27/2023  Patient will be independent with a long-term home exercise program for self-management of symptoms.  Baseline: Initial HEP to be provided at visit 2 as appropriate (03/04/23); Initial HEP to be provided at visit 2 as appropriate (03/04/23); initial HEP provided at visit #2 (03/17/2023);  Goal status: In progress  2.  Patient will demonstrate improved FOTO to equal or greater than 47 by visit #14 to demonstrate improvement in overall condition and self-reported functional ability.  Baseline: 33 (03/04/23); Goal status: In progress  3.  Patient will ambulate 1729 feet during 6 Minute Walk Test which is his age matched norm  to demonstrate improved ability to walk community distances and go shopping.  Baseline:  to be tested visit 2 as appropriate (03/04/23); 1152 feet with no AD, up to 8/10 pain in left glute and developed antalgic gait by end of set (03/17/2023);  Goal status: In progress  4.  Patient will report left glute pain of equal or less than 3/10 with functional activities to improve his ability to perform activities that require standing and walking.  Baseline: up to 8/10 (03/04/23); Goal status: In progress  5.  Patient will demonstrate improvement in Patient Specific Functional Scale (PSFS) of equal or greater than 3 points to reflect clinically significant improvement in patient's most valued functional activities. Baseline: to be measured visit 2 as appropriate (03/04/23); 2/10 (03/17/2023) Goal status: In progress   PLAN:  PT FREQUENCY: 1-2x/week  PT DURATION: 8-12 weeks  PLANNED INTERVENTIONS: 97164- PT Re-evaluation, 97110-Therapeutic exercises, 97530- Therapeutic activity, [id number]- Neuromuscular re-education, 97535- Self Care, 16109- Manual therapy, [id number]- Gait training, [phone]- Aquatic Therapy, 97014- Electrical stimulation (unattended), [phone]- Electrical stimulation (manual), Patient/Family education, Balance training, Dry Needling, Joint mobilization, Spinal mobilization, DME instructions, Cryotherapy, and Moist heat.  PLAN FOR NEXT SESSION: Update HEP as appropriate, dry needling to left glute region as appropriate and per patient/MD preference, progressive trunk/LE/functional strengthening and ROM exercises as tolerated. Manual therapy as needed. Education.   Maylene Crocker [place], SPT [employer] DPTE  Huntley Dec R. Ilsa Iha, PT, DPT 03/31/23, 1:15 PM  [facility] [address] [place], [place] [address] P: [phone] I F: [phone]

## 2023-04-01 ENCOUNTER — Encounter: Payer: Self-pay | Admitting: Physician Assistant

## 2023-04-01 ENCOUNTER — Ambulatory Visit: Payer: HMO | Attending: Physician Assistant | Admitting: Physician Assistant

## 2023-04-01 VITALS — BP 110/64 | HR 68 | Ht 70.0 in | Wt 193.6 lb

## 2023-04-01 DIAGNOSIS — I10 Essential (primary) hypertension: Secondary | ICD-10-CM | POA: Diagnosis not present

## 2023-04-01 DIAGNOSIS — E785 Hyperlipidemia, unspecified: Secondary | ICD-10-CM | POA: Diagnosis not present

## 2023-04-01 DIAGNOSIS — Z789 Other specified health status: Secondary | ICD-10-CM | POA: Diagnosis not present

## 2023-04-01 DIAGNOSIS — I4891 Unspecified atrial fibrillation: Secondary | ICD-10-CM

## 2023-04-01 DIAGNOSIS — R0609 Other forms of dyspnea: Secondary | ICD-10-CM | POA: Diagnosis not present

## 2023-04-01 DIAGNOSIS — R0602 Shortness of breath: Secondary | ICD-10-CM | POA: Diagnosis not present

## 2023-04-01 DIAGNOSIS — I471 Supraventricular tachycardia, unspecified: Secondary | ICD-10-CM

## 2023-04-01 DIAGNOSIS — N1831 Chronic kidney disease, stage 3a: Secondary | ICD-10-CM | POA: Diagnosis not present

## 2023-04-01 DIAGNOSIS — I214 Non-ST elevation (NSTEMI) myocardial infarction: Secondary | ICD-10-CM

## 2023-04-01 DIAGNOSIS — I2510 Atherosclerotic heart disease of native coronary artery without angina pectoris: Secondary | ICD-10-CM | POA: Diagnosis not present

## 2023-04-01 DIAGNOSIS — Z951 Presence of aortocoronary bypass graft: Secondary | ICD-10-CM

## 2023-04-01 DIAGNOSIS — I9789 Other postprocedural complications and disorders of the circulatory system, not elsewhere classified: Secondary | ICD-10-CM | POA: Diagnosis not present

## 2023-04-01 NOTE — Progress Notes (Signed)
Cardiology Office Note    Date:  04/01/2023   ID:  Rodney Hall., DOB [birth year], MRN [id number]  PCP:  Rodney Limes, MD  Cardiologist:  Julien Nordmann, MD  Electrophysiologist:  None   Chief Complaint: Follow-up  History of Present Illness:   Rodney Hall. is a 78 y.o. male with history of CAD status post 5-vessel CABG on 09/07/2022, postoperative A-fib, CKD stage III, HLD, and BPH who presents for follow-up of CAD.   He was admitted to the hospital in 09/2022 with NSTEMI.  LHC on 09/04/2022 showed severe three-vessel CAD including sequential 90% stenoses of the mid/distal LAD, 70% mid LCx stenosis, occluded distal segment of OM1, and 80 to 90% ostial rPDA and proximal rPLA stenoses.  Moderately to severely reduced LV systolic function with an EF of 30 to 35% with apical dyskinesis.  LVEDP 15 mmHg.  In this setting, he was transferred to [facility] for consideration of CABG.  Due to refractory angina, he underwent IABP placement with stable coronary anatomy by cardiac cath on 09/06/2022.  He subsequently underwent 5 vessel CABG on 09/07/2022 with LIMA to LAD, SVG to diagonal, SVG to OM 2, and sequential SVG to rPDA and rPLA.  Postoperative course was notable for bifascicular block leading beta-blocker to be deferred, atrial fibrillation converting to sinus rhythm with amiodarone, volume overload with subsequent AKI, confusion, and agitation.   He was seen in the office on 10/01/2022 and was without symptoms of angina or cardiac decompensation.  He reported prior jitteriness with statin therapy.  Zio patch following discharge showed a predominant rhythm of sinus with an average rate of 74 bpm (range 51 to 125 bpm), 3 episodes of SVT lasting up to 9 beats, and rare atrial and ventricular ectopy.  Patient triggered events were associated with sinus rhythm.  He followed up with CVTS on 10/30/2022 and was doing well from a surgical perspective.  Amiodarone was discontinued at that time.  He was  evaluated by the lipid clinic and with recommendation to start Repatha.  He was last seen in the office in 12/2022 and was without symptoms of angina or cardiac decompensation.  He remained active at baseline with main limiting factor noted to be chronic low back pain.  He had not yet started Repatha with concerns regarding given himself injections.  He preferred to retry low-dose of atorvastatin and was started on Lipitor 10 mg daily.  He was evaluated in the ED on 03/03/2023 with hematochezia with recommendation to start PPI and follow-up with GI as outpatient.  He comes in today and is without angina.  He notes a several week history of increased exertional shortness of breath and fatigue.  No palpitations, presyncope, or syncope.  No significant lower extremity swelling.  Since he was seen in the ED he reports at least 1 episode of hematochezia.  He indicates he will be undergoing colonoscopy next month.  Reports he has tolerated the rechallenge of atorvastatin at 10 mg.  Remains on DAPT.   Labs independently reviewed: 02/2023 - potassium 3.9, BUN 17, serum creatinine 1.52, albumin 3.9, AST/ALT normal, Hgb 12.0, PLT 261 09/2022 - magnesium 2.3, A1c 5.7, LP(a) 19.3, TC 147, TG 177, HDL 32, LDL 80 05/2020 - TSH normal  Past Medical History:  Diagnosis Date   Abdominal mass    Abdominal pain 06/02/2017   Anxiety    Pt. denies   Arthritis    pt. denies   Basal cell carcinoma 01/21/2017  left lat forehead   BPH with obstruction/lower urinary tract symptoms    Cholecystitis 06/02/2017   Choledocholithiasis with acute cholecystitis    History of actinic keratoses    Interstitial cystitis    Pancreatic cyst    Pneumonia    27 years ago   Squamous cell carcinoma of skin 08/14/2016   left above alar crease sidewall of nose/in situ    Past Surgical History:  Procedure Laterality Date   ANTERIOR LAT LUMBAR FUSION Right 07/30/2016   Procedure: Right Lateral two-three Lateral transpsoas  interbody fusion with lateral plating/Minimally invasive decompression at Lumbar two-three;  Surgeon: Ditty, Loura Halt, MD;  Location: [facility] OR;  Service: Neurosurgery;  Laterality: Right;   BACK SURGERY  2012   CATARACT EXTRACTION, BILATERAL     CHOLECYSTECTOMY N/A 07/25/2017   Procedure: LAPAROSCOPIC CHOLECYSTECTOMY WITH INTRAOPERATIVE CHOLANGIOGRAM;  Surgeon: Luretha Murphy, MD;  Location: [facility] ORS;  Service: General;  Laterality: N/A;   COLONOSCOPY WITH PROPOFOL N/A 12/12/2017   Procedure: COLONOSCOPY WITH PROPOFOL;  Surgeon: Scot Jun, MD;  Location: [facility] ENDOSCOPY;  Service: Endoscopy;  Laterality: N/A;   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   Procedure: CORONARY ARTERY BYPASS GRAFTING (CABG) TIMES FIVE, USING LEFT INTERNAL MAMMARY ARTERY AND RIGHT GREATER SAPHENOUS VEIN HARVESTED ENDOSCOPICALLY;  Surgeon: [name], MD;  Location: [facility] OR;  Service: Open Heart Surgery;  Laterality: N/A;   EUS N/A 07/10/2017   Procedure: UPPER ENDOSCOPIC ULTRASOUND (EUS) LINEAR;  Surgeon: Rachael Fee, MD;  Location: [facility] ENDOSCOPY;  Service: Endoscopy;  Laterality: N/A;   EYE SURGERY     HEMI-MICRODISCECTOMY LUMBAR LAMINECTOMY LEVEL 1  09/2018   [facility]   IABP INSERTION N/A 09/06/2022   Procedure: IABP Insertion;  Surgeon: Swaziland, Peter M, MD;  Location: [facility] INVASIVE CV LAB;  Service: Cardiovascular;  Laterality: N/A;   LEFT HEART CATH AND CORONARY ANGIOGRAPHY N/A 09/04/2022   Procedure: LEFT HEART CATH AND CORONARY ANGIOGRAPHY;  Surgeon: Yvonne Kendall, MD;  Location: [facility] INVASIVE CV LAB;  Service: Cardiovascular;  Laterality: N/A;   LEFT HEART CATH AND CORONARY ANGIOGRAPHY N/A 09/06/2022   Procedure: LEFT HEART CATH AND CORONARY ANGIOGRAPHY;  Surgeon: Swaziland, Peter M, MD;  Location: [facility] INVASIVE CV LAB;  Service: Cardiovascular;  Laterality: N/A;   NECK SURGERY  2000   TEE WITHOUT CARDIOVERSION N/A 09/07/2022   Procedure: TRANSESOPHAGEAL ECHOCARDIOGRAM;  Surgeon: [name], MD;  Location: [facility] OR;  Service: Open Heart Surgery;  Laterality: N/A;   TENNIS ELBOW RELEASE/NIRSCHEL PROCEDURE     TONSILLECTOMY AND ADENOIDECTOMY      Current Medications: Current Meds  Medication Sig   acetaminophen (TYLENOL) 325 MG tablet Take 650 mg by mouth every 6 (six) hours as needed for moderate pain or headache.   aspirin EC 81 MG tablet Take 1 tablet (81 mg total) by mouth daily. Swallow whole.   atorvastatin (LIPITOR) 10 MG tablet Take 1 tablet (10 mg total) by mouth daily.   clopidogrel (PLAVIX) 75 MG tablet Take 1 tablet (75 mg total) by mouth daily.   imipramine (TOFRANIL) 50 MG tablet Take 1 tablet (50 mg total) by mouth at bedtime.   omeprazole (PRILOSEC) 20 MG capsule Take 20 mg by mouth 2 (two) times daily.   pantoprazole (PROTONIX) 40 MG tablet Take 1 tablet (40 mg total) by mouth daily for 14 days.   tamsulosin (FLOMAX) 0.4 MG CAPS capsule Take 1 capsule (0.4 mg total) by mouth daily after supper.    Allergies:  Pravastatin and Amoxicillin   Social History   Socioeconomic History   Marital status: Married    Spouse name: Not on file   Number of children: Not on file   Years of education: Not on file   Highest education level: Not on file  Occupational History   Not on file  Tobacco Use   Smoking status: Never    Passive exposure: Never   Smokeless tobacco: Never  Vaping Use   Vaping status: Never Used  Substance and Sexual Activity   Alcohol use: No    Alcohol/week: 0.0 standard drinks of alcohol   Drug use: No   Sexual activity: Yes  Other Topics Concern   Not on file  Social History Narrative   Not on file   Social Drivers of Health   Financial Resource Strain: Low Risk  (07/03/2022)   Overall Financial Resource Strain (CARDIA)    Difficulty of Paying Living Expenses: Not hard at all  Food Insecurity: No Food Insecurity (09/06/2022)   Hunger Vital Sign    Worried About Running Out of Food in the Last Year: Never true    Ran Out of Food in  the Last Year: Never true  Transportation Needs: No Transportation Needs (09/06/2022)   PRAPARE - Administrator, Civil Service (Medical): No    Lack of Transportation (Non-Medical): No  Physical Activity: Sufficiently Active (07/03/2022)   Exercise Vital Sign    Days of Exercise per Week: 5 days    Minutes of Exercise per Session: 60 min  Stress: No Stress Concern Present (07/03/2022)   [employer] of Occupational Health - Occupational Stress Questionnaire    Feeling of Stress : Not at all  Social Connections: Moderately Isolated (07/03/2022)   Social Connection and Isolation Panel [NHANES]    Frequency of Communication with Friends and Family: More than three times a week    Frequency of Social Gatherings with Friends and Family: Three times a week    Attends Religious Services: Never    Active Member of Clubs or Organizations: No    Attends Banker Meetings: Never    Marital Status: Married     Family History:  The patient's family history includes Cancer in his mother; Diabetes in his brother and father; Heart disease in his sister; Rheumatic fever in his sister.  ROS:   12-point review of systems is negative unless otherwise noted in the HPI.   EKGs/Labs/Other Studies Reviewed:    Studies reviewed were summarized above. The additional studies were reviewed today:  Zio patch 09/2022: Normal sinus rhythm Patient had a min HR of 51 bpm, max HR of 125 bpm, and avg HR of 74 bpm.   3 Supraventricular Tachycardia runs occurred, the run with the fastest interval lasting 4 beats with a max rate of 124 bpm, the longest lasting 9 beats with an avg rate of 103 bpm.    Isolated SVEs were rare (<1.0%), SVE Couplets were rare (<1.0%), and SVE Triplets were rare (<1.0%).  Isolated VEs were rare (<1.0%), VE Couplets were rare (<1.0%), and no VE Triplets were present.   Patient triggered events associated with normal sinus rhythm. __________   Intraoperative TEE  09/07/2022: POST-OP IMPRESSIONS  Overall, there were no significant changes from pre-bypass.  _ Left Ventricle: The left ventricle is unchanged from pre-bypass.  _ Right Ventricle: The right ventricle appears unchanged from pre-bypass.  _ Aorta: The aorta appears unchanged from pre-bypass.  _ Left Atrium: The left atrium  appears unchanged from pre-bypass.  _ Left Atrial Appendage: The left atrial appendage appears unchanged from  pre-bypass.  _ Aortic Valve: The aortic valve appears unchanged from pre-bypass.  _ Mitral Valve: There is mild regurgitation.  _ Tricuspid Valve: There is trace regurgitation.  _ Pulmonic Valve: The pulmonic valve appears unchanged from pre-bypass.  _ Interatrial Septum: The interatrial septum appears unchanged from  pre-bypass.  _ Interventricular Septum: The interventricular septum appears unchanged  from  pre-bypass.  _ Pericardium: The pericardium appears unchanged from pre-bypass.  __________   Pre-CABG vascular imaging 09/06/2022: Summary:  Right Carotid: Waveforms are unreliable in the presence of an IABP.  Possible area of stenosis is noted within the proximal ICA. Unable  to determine degree of stenosis due to unreliable waveforms.   Left Carotid: Waveforms are unreliable in the presence of an IABP.  Vertebrals: Bilateral vertebral arteries demonstrate antegrade flow.   Left ABI: Resting left ankle-brachial index is within normal range.  Right Upper Extremity: Doppler waveform obliterate with right radial  compression. Doppler waveforms remain within normal limits with right  ulnar compression.  Left Upper Extremity: Doppler waveforms decrease 50% with left radial  compression. Doppler waveforms remain within normal limits with left ulnar  compression.  __________   LHC 09/06/2022:   Mid LM to Ost LAD lesion is 30% stenosed with 30% stenosed side branch in Ost Cx.   Mid LAD-1 lesion is 90% stenosed.   Mid LAD-2 lesion is 90% stenosed.   Dist  LAD lesion is 90% stenosed.   Mid Cx to Dist Cx lesion is 70% stenosed.   2nd Diag lesion is 70% stenosed.   2nd Mrg lesion is 100% stenosed.   RPDA lesion is 80% stenosed.   RPAV lesion is 90% stenosed.   LV end diastolic pressure is normal.   No change in severe 3 vessel obstructive CAD Normal LVEDP IABP placed for refractory angina __________   LHC 09/04/2022: Conclusions: Severe three-vessel coronary artery disease, as detailed below.  Culprit lesion for the patient's NSTEMI is most likely occluded distal segment of OM1.  There are also sequential 90% stenoses of the mid/distal LAD, 70% mid LCx lesion, and 80-90% ostial rPDA and proximal rPLA stenoses. Moderately to severely reduced left ventricular systolic function (LVEF 30-35%) with apical dyskinesis. Upper normal left ventricular filling pressure (LVEDP 15 mmHg).   Recommendations: Transfer to [facility] for cardiac surgery consultation for CABG. Restart heparin infusion 2 hours after TR band removal.  Defer addition of P2Y12 inhibitor pending cardiac surgery consultation. Aggressive secondary prevention of coronary artery disease. Maintain net even fluid balance and escalate goal-directed medical therapy for HFrEF as tolerated. Follow-up echocardiogram.   EKG:  EKG is ordered today.  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  Recent Labs: 09/08/2022: Magnesium 2.3 03/03/2023: ALT 13; BUN 17; Creatinine, Ser 1.52; Hemoglobin 12.0; Platelets 261; Potassium 3.9; Sodium 135  Recent Lipid Panel    Component Value Date/Time   CHOL 147 09/04/2022 0300   CHOL 181 02/22/2022 1056   TRIG 177 (H) 09/04/2022 0300   HDL 32 (L) 09/04/2022 0300   HDL 34 (L) 02/22/2022 1056   CHOLHDL 4.6 09/04/2022 0300   VLDL 35 09/04/2022 0300   LDLCALC 80 09/04/2022 0300   LDLCALC 108 (H) 02/22/2022 1056    PHYSICAL EXAM:    VS:  BP 110/64 (BP Location: Left Arm, Patient  Position: Sitting, Cuff Size: Normal)   Pulse  68   Ht 5\' 10"  (1.778 m)   Wt 193 lb 9.6 oz (87.8 kg)   SpO2 97%   BMI 27.78 kg/m   BMI: Body mass index is 27.78 kg/m.  Physical Exam Vitals reviewed.  Constitutional:      Appearance: He is well-developed.  HENT:     Head: Normocephalic and atraumatic.  Eyes:     General:        Right eye: No discharge.        Left eye: No discharge.  Neck:     Vascular: No JVD.  Cardiovascular:     Rate and Rhythm: Normal rate and regular rhythm.     Pulses:          Posterior tibial pulses are 2+ on the right side and 2+ on the left side.     Heart sounds: Normal heart sounds, S1 normal and S2 normal. Heart sounds not distant. No midsystolic click and no opening snap. No murmur heard.    No friction rub.  Pulmonary:     Effort: Pulmonary effort is normal. No respiratory distress.     Breath sounds: Normal breath sounds. No decreased breath sounds, wheezing, rhonchi or rales.  Chest:     Chest wall: No tenderness.  Abdominal:     General: There is no distension.  Musculoskeletal:     Cervical back: Normal range of motion.     Right lower leg: No edema.     Left lower leg: No edema.  Skin:    General: Skin is warm and dry.     Nails: There is no clubbing.  Neurological:     Mental Status: He is alert and oriented to person, place, and time.  Psychiatric:        Speech: Speech normal.        Behavior: Behavior normal.        Thought Content: Thought content normal.        Judgment: Judgment normal.     Wt Readings from Last 3 Encounters:  04/01/23 193 lb 9.6 oz (87.8 kg)  03/03/23 188 lb (85.3 kg)  03/03/23 195 lb (88.5 kg)     ASSESSMENT & PLAN:   CAD involving the native coronary arteries with NSTEMI status post 5 vessel CABG without angina:He is without symptoms of angina.  We will obtain an echo given his a several week history of exertional dyspnea, though this may be related to possible progressive anemia with underlying  hematochezia.  Continue aggressive risk factor modification and secondary prevention including aspirin and clopidogrel, for now pending CBC.  He will otherwise continue on atorvastatin 10 mg.  He has not been maintained on beta-blocker secondary to prior bifascicular block.  Exertional dyspnea: Possibly exacerbated by potential anemia in the setting of GI bleed.  Check CBC to evaluate for progressive anemia.  Obtain echo.  Postoperative A-fib: Maintaining sinus rhythm.  No longer on amiodarone.  No evidence of recurrence at follow-up visit or on outpatient cardiac monitoring.  In this setting he has not been maintained on OAC.  Should he have documented recurrence of A-fib OAC will need to be revisited at that time.  PSVT: 3 brief episodes noted on outpatient cardiac monitoring.  No sustained arrhythmias.  Not requiring AV nodal blocking medication.  HTN: Blood pressure is well-controlled in the office today.  Not currently requiring antihypertensive therapy.  HLD with statin intolerance: LDL 80 in 09/2022.  Seems to be tolerating atorvastatin 10 mg daily on rechallenge.  Prefers to avoid PCSK9 inhibitor due to ejection component.  Check CMP, lipid panel, and direct LDL with recommendation to escalate atorvastatin as indicated to achieve target LDL of less than 55.  CKD stage IIIa: Followed by nephrology.    Disposition: F/u with Dr. Mariah Milling or an APP in 2 months.   Medication Adjustments/Labs and Tests Ordered: Current medicines are reviewed at length with the patient today.  Concerns regarding medicines are outlined above. Medication changes, Labs and Tests ordered today are summarized above and listed in the Patient Instructions accessible in Encounters.   Signed, Eula Listen, PA-C 04/01/2023 1:54 PM     [facility] [place] [address] [place], [place] [address] [phone]

## 2023-04-01 NOTE — Progress Notes (Signed)
 Patient: Rodney Hall.  Service Category: E/M  Provider: Eric DELENA Como, Hall  DOB: 1946/02/12  DOS: 04/02/2023  Referring Provider: Dodson Delon FERNS, Hall  MRN: [id number]  Setting: Ambulatory outpatient  PCP: Rodney Nancyann BRAVO, Hall  Type: New Patient  Specialty: Interventional Pain Management    Location: Office  Delivery: Face-to-face     Primary Reason(s) for Visit: Encounter for initial evaluation of one or more chronic problems (new to examiner) potentially causing chronic pain, and posing a threat to normal musculoskeletal function. (Level of risk: High) CC: Back Pain and Hip Pain (Left hip )  HPI  Rodney Hall is a 78 y.o. year old, male patient, who comes for the first time to our practice referred by Rodney Hall for our initial evaluation of his chronic pain. He has Benign prostatic hyperplasia with urinary obstruction; Interstitial cystitis; Pancreatic cyst; Impingement syndrome of shoulder region; Abdominal mass; Hypertriglyceridemia; History of colon polyps; History of spinal fusion; Chronic kidney disease, stage 3b (HCC); NSTEMI (non-ST elevated myocardial infarction) (HCC); Primary hypertension; Coronary artery disease; s/p cabg x 5; Hyperlipidemia, mixed; Abnormal urine; Cyst of kidney, acquired; Low back pain; Lumbar post-laminectomy syndrome; Lumbosacral spondylosis with radiculopathy; Type 2 diabetes mellitus with diabetic chronic kidney disease (HCC); Abnormal CT scan, lumbar spine (06/17/2016); Chronic anticoagulation (Plavix ); Failed back surgical syndrome; and Chronic low back pain (1ry area of Pain) (Bilateral) w/o sciatica on their problem list. Today he comes in for evaluation of his Back Pain and Hip Pain (Left hip )  Pain Assessment: Location: Lower, Right, Left Back Radiating: radiates from lower back into left buttocks into hips bilateral Onset: More than a month ago Duration: Chronic pain Quality: Aching, Sharp Severity: 5 /10 (subjective, self-reported  pain score)  Effect on ADL: Limits ADLs Timing: Constant Modifying factors: tylenol , sitting, and laying BP: 120/77  HR: 88  Onset and Duration: Gradual and Present longer than 3 months Cause of pain:  left sided herniation Severity: No change since onset, NAS-11 at its worse: 9/10, NAS-11 at its best: 2/10, and NAS-11 on the average: 5/10 Timing: Night and During activity or exercise Aggravating Factors: Prolonged standing and Walking Alleviating Factors: Hot packs and Sitting Associated Problems: Dizziness and Pain that does not allow patient to sleep Quality of Pain: Agonizing and Sharp Previous Examinations or Tests: MRI scan Previous Treatments: Epidural steroid injections  Rodney Hall is being evaluated for possible interventional pain management therapies for the treatment of his chronic pain.  Discussed the use of AI scribe software for clinical note transcription with the patient, who gave verbal consent to proceed.  History of Present Illness   The patient, with chronic back pain, presents for evaluation of her condition. She was referred by Dr. Dodson for evaluation of her chronic back pain.  The patient has a history of chronic back pain following four spine surgeries, including one cervical surgery due to five herniated discs. The most recent surgery was in 2020. Since then, she has been receiving cortisone injections approximately every six months, but the last injection did not provide relief. She has also been undergoing dry needle therapy at the hospital's physical therapy department on [facility], performed by Rodney Hall, but has not found it helpful. An MRI was conducted by Rodney Hall, who determined that further surgery would not be beneficial. No leg pain, numbness, or weakness is present.  She experiences increased pain, particularly in the left hip, which causes her to limp when walking long distances. There  is also some back pain and a little right hip pain, but  the left hip is the primary source of discomfort.  In July, she had a myocardial infarction and underwent coronary artery bypass grafting, with five bypasses in total. She is currently on anticoagulation therapy and reports being 'pretty much over' the heart attack. She has been informed that she has a 'good heart' with no heart disease but is aware of the high risk associated with her cardiac history, especially within six months post-myocardial infarction.      Rodney Hall has been informed that this initial visit was an evaluation only.  On the follow up appointment I will go over the results, including ordered tests and available interventional therapies. At that time he will have the opportunity to decide whether to proceed with offered therapies or not. In the event that Rodney Hall prefers avoiding interventional options, this will conclude our involvement in the case.  Medication management recommendations may be provided upon request.  Patient informed that diagnostic tests may be ordered to assist in identifying underlying causes, narrow the list of differential diagnoses and aid in determining candidacy for (or contraindications to) planned therapeutic interventions.  Historic Controlled Substance Pharmacotherapy Review  PMP and historical list of controlled substances: Tramadol  50 mg tablet, 1 tab p.o. 4 times daily (# 28) (last filled on 09/12/2022) Most recently prescribed opioid analgesics: None MME/day: 0 mg/day  Historical Monitoring: The patient  reports no history of drug use. List of prior UDS Testing: No results found for: MDMA, COCAINSCRNUR, PCPSCRNUR, PCPQUANT, CANNABQUANT, THCU, ETH, CBDTHCR, D8THCCBX, D9THCCBX Historical Background Evaluation: [place] PMP: [name] Review of the past 72-months conducted.             PMP NARX Score Report:  Narcotic: 080 Sedative: 040 Stimulant: 000 [place] Department of public safety, offender  search: Engineer, [employer] Information) Non-contributory Risk Assessment Profile: Aberrant behavior: None observed or detected today Risk factors for fatal opioid overdose: None identified today PMP NARX Overdose Risk Score: 040 Fatal overdose hazard ratio (HR): Calculation deferred Non-fatal overdose hazard ratio (HR): Calculation deferred Risk of opioid abuse or dependence: 0.7-3.0% with doses <= 36 MME/day and 6.1-26% with doses >= 120 MME/day. Substance use disorder (SUD) risk level: See below Personal History of Substance Abuse (SUD-Substance use disorder):  Alcohol:    Illegal Drugs:    Rx Drugs:    ORT Risk Level calculation:    ORT Scoring interpretation table:  Score <3 = Low Risk for SUD  Score between 4-7 = Moderate Risk for SUD  Score >8 = High Risk for Opioid Abuse   PHQ-2 Depression Scale:  Total score: 0  PHQ-2 Scoring interpretation table: (Score and probability of major depressive disorder)  Score 0 = No depression  Score 1 = 15.4% Probability  Score 2 = 21.1% Probability  Score 3 = 38.4% Probability  Score 4 = 45.5% Probability  Score 5 = 56.4% Probability  Score 6 = 78.6% Probability   PHQ-9 Depression Scale:  Total score: 0  PHQ-9 Scoring interpretation table:  Score 0-4 = No depression  Score 5-9 = Mild depression  Score 10-14 = Moderate depression  Score 15-19 = Moderately severe depression  Score 20-27 = Severe depression (2.4 times higher risk of SUD and 2.89 times higher risk of overuse)   Pharmacologic Plan: As per protocol, I have not taken over any controlled substance management, pending the results of ordered tests and/or consults.  Initial impression: Pending review of available data and ordered tests.  Meds   Current Outpatient Medications:    acetaminophen  (TYLENOL ) 325 MG tablet, Take 650 mg by mouth every 6 (six) hours as needed for moderate pain or headache., Disp: , Rfl:    aspirin  EC 81 MG tablet, Take 1 tablet (81 mg total) by mouth  daily. Swallow whole., Disp: 30 tablet, Rfl: 12   atorvastatin  (LIPITOR) 10 MG tablet, Take 1 tablet (10 mg total) by mouth daily., Disp: 90 tablet, Rfl: 3   clopidogrel  (PLAVIX ) 75 MG tablet, Take 1 tablet (75 mg total) by mouth daily., Disp: 90 tablet, Rfl: 3   imipramine  (TOFRANIL ) 50 MG tablet, Take 1 tablet (50 mg total) by mouth at bedtime., Disp: 90 tablet, Rfl: 3   omeprazole  (PRILOSEC) 20 MG capsule, Take 20 mg by mouth 2 (two) times daily., Disp: , Rfl:    pantoprazole  (PROTONIX ) 40 MG tablet, Take 1 tablet (40 mg total) by mouth daily for 14 days. (Patient taking differently: Take 40 mg by mouth daily. PRN), Disp: 14 tablet, Rfl: 0   tamsulosin  (FLOMAX ) 0.4 MG CAPS capsule, Take 1 capsule (0.4 mg total) by mouth daily after supper., Disp: 90 capsule, Rfl: 3  Imaging Review  Cervical Imaging: Cervical DG 1 view: Results for orders placed in visit on 03/26/01 DG Cervical Spine 1 View  Narrative FINDINGS CLINICAL DATA:  FOLLOW UP CERVICAL FUSION. CERVICAL SPINE, ONE VIEW LATERAL AND SWIMMER'S VIEWS DEMONSTRATE RECENT ANTERIOR DISKECTOMY AND FUSION AT C6-7 WITH AN ANTERIOR SCREW PLATE AND INTERVERTEBRAL BONE PLUG.  THE HARDWARE IS INTACT.  PARTIAL RESORPTION OF THE BONE PLUG IS SUSPECTED, BUT THERE IS NO DISPLACEMENT DEMONSTRATED.  ALIGNMENT IS ANATOMIC THROUGH T1. IMPRESSION ANATOMIC ALIGNMENT AND INTACT HARDWARE STATUS POST C6-7 ACDF.   PARTIAL RESORPTION OF THE INTERVERTEBRAL BONE PLUG IS SUSPECTED.  Lumbosacral Imaging: Lumbar MR wo contrast: Results for orders placed during the hospital encounter of 02/10/10 MR Lumbar Spine Wo Contrast  Narrative Clinical Data: Low back pain.  No radicular symptoms.  MRI LUMBAR SPINE WITHOUT CONTRAST  Technique:  Multiplanar and multiecho pulse sequences of the lumbar spine were obtained without intravenous contrast.  Comparison: None.  Findings: The numbering convention used for this exam terms L5-S1 as the last full  intervertebral disc space above the sacrum.  Trace anterolisthesis of L3 on L4 is present.  The lower lumbar spinal canal is congenitally narrowed secondary to short pedicles.  Spinal cord terminates posterior to the L1-L2 interspace.  The paraspinal soft tissues are within normal limits.  L1-L2:  Mild disc desiccation. L2-L3:  Negative. L3-L4:  Trace anterolisthesis.  Disc desiccation with shallow broad- based posterior protrusion.  Bilateral facet hypertrophy and ligamentum flavum redundancy produce moderate central stenosis with narrowing of the lateral recesses.  Moderate bilateral foraminal stenosis, left greater than right associated with short pedicles and broad-based posterior disc protrusion. L4-L5:  Disc desiccation with broad-based shallow posterior bulge. Facet hypertrophy.  Mild central stenosis.  Crowding of the lateral recesses without definite neural compression.  Mild to moderate symmetric bilateral foraminal stenosis due to congenital and acquired factors. L5-S1:  Disc desiccation with small concentric central annular tear and associated shallow protrusion.  Despite the tear and protrusion, there is no significant stenosis at L5-S1.  IMPRESSION:  1.  L3-L4 trace anterolisthesis with bilateral facet hypertrophy, ligamentum flavum redundancy and broad-based posterior protrusion that produce moderate central stenosis with crowding of both lateral recesses.  Left  greater than right foraminal stenosis at  L3-L4. 2.  Less pronounced degenerative disease at L4-L5 with shallow broad-based posterior bulge, mild central stenosis and mild symmetric bilateral foraminal stenosis. 3.  L5-S1 central concentric annular tear with shallow protrusion. No stenosis.  Provider: Corean Console  Lumbar CT wo contrast: Results for orders placed during the hospital encounter of 06/17/16 CT LUMBAR SPINE WO CONTRAST  Narrative CLINICAL DATA:  Low back pain and pinching for 3  months. No reported Leg pain.  EXAM: CT LUMBAR SPINE WITHOUT CONTRAST  TECHNIQUE: Multidetector CT imaging of the lumbar spine was performed without intravenous contrast administration. Multiplanar CT image reconstructions were also generated.  COMPARISON:  Intraoperative radiograph 03/13/2010. Plain films most recent 06/05/2016.  FINDINGS: Segmentation: Standard.  Alignment: Anatomic except for trace anterolisthesis L2-3, 1-2 mm as seen on sagittal reformatted imaging. Anatomic alignment across a fusion construct L3 through L5.  Vertebrae: Status post L3-L5 XLIF augmented with RIGHT L3-L5 pedicle screw construct. Developing interbody arthrodesis across L4-5. Slight lucency surrounding the RIGHT L5 screw. No solid interbody arthrodesis L3-4. No solid posterior arthrodesis at any level. No worrisome osseous lesion.  Paraspinal and other soft tissues: Aortic atherosclerosis. No hydronephrosis or paravertebral mass.  Disc levels:  L1-L2:  Unremarkable.  L2-L3: Central protrusion. Posterior element hypertrophy. Disc material extends into both foramina. RIGHT greater than LEFT L2 and L3 nerve root impingement is likely.  L3-L4: Pseudarthrosis. No solid interbody or posterior fusion. Facet arthropathy. No definite impingement.  L4-L5: Solid interbody arthrodesis. Posterior element hypertrophy. Mild stenosis. No definite L4 or L5 nerve root impingement.  L5-S1: Central disc protrusion. Facet arthropathy. BILATERAL L5 and S1 nerve root impingement suspected.  IMPRESSION: Status post L3-L5 XLIF. Pseudarthrosis at L3-4. Solid arthrodesis L4-5.  Adjacent segment disease at L2-3 and L5-S1 related to central protrusions and posterior element hypertrophy potentially affecting the subarticular zone and foraminal zone nerve roots. Trace anterolisthesis L2-3. If further investigation desired, consider lumbar myelogram and postmyelogram CT to evaluate for relative degree of  stenosis and/or dynamic instability.   Electronically Signed By: Norleen ONEIDA Catching M.D. On: 06/17/2016 12:37  Lumbar DG 2-3 views: Results for orders placed during the hospital encounter of 07/30/16 DG Lumbar Spine 2-3 Views  Narrative CLINICAL DATA:  L2-3 X lift.  EXAM: LUMBAR SPINE - 2-3 VIEW; DG C-ARM 61-120 MIN  COMPARISON:  Spinal numbering as on preoperative MRI 06/26/2016  FINDINGS: Pre-existing discectomy at L3-4 and L4-5 with posterior rod and screw fixation on the right. Fluoroscopy shows changes of L2-3 discectomy from lateral approach. The discectomy cage appears located on the final images. No visible intraoperative fracture.  IMPRESSION: Fluoroscopy for L2-3 lateral discectomy.   Electronically Signed By: Cassondra Roulette M.D. On: 07/30/2016 11:41  Complexity Note: Imaging results reviewed.                         ROS  Cardiovascular: Heart trouble, Abnormal heart rhythm, Chest pain, Heart attack ( Date: 2024), Heart surgery, and Blood thinners:  Anticoagulant Pulmonary or Respiratory: Lung problems and Shortness of breath Neurological: No reported neurological signs or symptoms such as seizures, abnormal skin sensations, urinary and/or fecal incontinence, being born with an abnormal open spine and/or a tethered spinal cord Psychological-Psychiatric: Anxiousness and Prone to panicking Gastrointestinal: No reported gastrointestinal signs or symptoms such as vomiting or evacuating blood, reflux, heartburn, alternating episodes of diarrhea and constipation, inflamed or scarred liver, or pancreas or irrregular and/or infrequent bowel movements Genitourinary: Kidney disease 3B Hematological: No reported hematological signs or symptoms  such as prolonged bleeding, low or poor functioning platelets, bruising or bleeding easily, hereditary bleeding problems, low energy levels due to low hemoglobin or being anemic Endocrine: No reported endocrine signs or symptoms such as  high or low blood sugar, rapid heart rate due to high thyroid  levels, obesity or weight gain due to slow thyroid  or thyroid  disease Rheumatologic: No reported rheumatological signs and symptoms such as fatigue, joint pain, tenderness, swelling, redness, heat, stiffness, decreased range of motion, with or without associated rash Musculoskeletal: Negative for myasthenia gravis, muscular dystrophy, multiple sclerosis or malignant hyperthermia Work History: Retired  Allergies  Mr. Slevin is allergic to pravastatin  and amoxicillin.  Laboratory Chemistry Profile   Renal Lab Results  Component Value Date   BUN 13 04/01/2023   CREATININE 1.38 (H) 04/01/2023   BCR 9 (L) 04/01/2023   GFRAA 58 (L) 01/13/2019   GFRNONAA 47 (L) 03/03/2023   SPECGRAV 1.015 11/28/2022   PHUR 5.5 11/28/2022   PROTEINUR Negative 11/28/2022     Electrolytes Lab Results  Component Value Date   NA 139 04/01/2023   K 4.8 04/01/2023   CL 103 04/01/2023   CALCIUM  9.9 04/01/2023   MG 2.3 09/08/2022   PHOS 3.5 02/22/2022     Hepatic Lab Results  Component Value Date   AST 12 04/01/2023   ALT 9 04/01/2023   ALBUMIN  4.3 04/01/2023   ALKPHOS 76 04/01/2023   LIPASE 41 03/03/2023     ID Lab Results  Component Value Date   SARSCOV2NAA NEGATIVE 09/06/2022   STAPHAUREUS NEGATIVE 09/06/2022   MRSAPCR NEGATIVE 09/06/2022   HCVAB <0.1 06/02/2017     Bone No results found for: VD25OH, CI874NY7UNU, CI6874NY7, CI7874NY7, 25OHVITD1, 25OHVITD2, 25OHVITD3, TESTOFREE, TESTOSTERONE   Endocrine Lab Results  Component Value Date   GLUCOSE 125 (H) 04/01/2023   GLUCOSEU Negative 11/28/2022   HGBA1C 5.7 (H) 09/06/2022   TSH 2.960 05/29/2020     Neuropathy Lab Results  Component Value Date   HGBA1C 5.7 (H) 09/06/2022     CNS No results found for: COLORCSF, APPEARCSF, RBCCOUNTCSF, WBCCSF, POLYSCSF, LYMPHSCSF, EOSCSF, PROTEINCSF, GLUCCSF, JCVIRUS, CSFOLI, IGGCSF, LABACHR,  ACETBL   Inflammation (CRP: Acute  ESR: Chronic) No results found for: CRP, ESRSEDRATE, LATICACIDVEN   Rheumatology Lab Results  Component Value Date   ANA Negative 06/04/2017     Coagulation Lab Results  Component Value Date   INR 1.6 (H) 09/07/2022   LABPROT 19.1 (H) 09/07/2022   APTT 54 (H) 09/07/2022   PLT 329 04/01/2023     Cardiovascular Lab Results  Component Value Date   HGB 11.7 (L) 04/01/2023   HCT 36.9 (L) 04/01/2023     Screening Lab Results  Component Value Date   SARSCOV2NAA NEGATIVE 09/06/2022   STAPHAUREUS NEGATIVE 09/06/2022   MRSAPCR NEGATIVE 09/06/2022   HCVAB <0.1 06/02/2017     Cancer No results found for: CEA, CA125, LABCA2   Allergens No results found for: ALMOND, APPLE, ASPARAGUS, AVOCADO, BANANA, BARLEY, BASIL, BAYLEAF, GREENBEAN, LIMABEAN, WHITEBEAN, BEEFIGE, REDBEET, BLUEBERRY, BROCCOLI, CABBAGE, MELON, CARROT, CASEIN, CASHEWNUT, CAULIFLOWER, CELERY     Note: Lab results reviewed.  PFSH  Drug: Mr. Monestime  reports no history of drug use. Alcohol:  reports no history of alcohol use. Tobacco:  reports that he has never smoked. He has never been exposed to tobacco smoke. He has never used smokeless tobacco. 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 Family: family history includes Cancer in his mother; Diabetes in his brother and father; Heart disease in his sister; Rheumatic fever in his sister.  Past Surgical History:  Procedure Laterality Date   ANTERIOR LAT LUMBAR FUSION Right 07/30/2016   Procedure: Right Lateral two-three Lateral  transpsoas interbody fusion with lateral plating/Minimally invasive decompression at Lumbar two-three;  Surgeon: Ditty, Morene Hicks, Hall;  Location: [facility];  Service: Neurosurgery;  Laterality: Right;   BACK SURGERY  2012   CATARACT EXTRACTION, BILATERAL     CHOLECYSTECTOMY N/A 07/25/2017   Procedure: LAPAROSCOPIC CHOLECYSTECTOMY WITH INTRAOPERATIVE CHOLANGIOGRAM;  Surgeon: Gladis Cough, Hall;  Location: [facility] ORS;  Service: General;  Laterality: N/A;   COLONOSCOPY WITH PROPOFOL  N/A 12/12/2017   Procedure: COLONOSCOPY WITH PROPOFOL ;  Surgeon: Viktoria Lamar DASEN, Hall;  Location[facility] ENDOSCOPY;  Service: Endoscopy;  Laterality: N/A;   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   Procedure: CORONARY ARTERY BYPASS GRAFTING (CABG) TIMES FIVE, USING LEFT INTERNAL MAMMARY ARTERY AND RIGHT GREATER SAPHENOUS VEIN HARVESTED ENDOSCOPICALLY;  Surgeon: Lucas Dorise POUR, Hall;  Location: [facility] OR;  Service: Open Heart Surgery;  Laterality: N/A;   EUS N/A 07/10/2017   Procedure: UPPER ENDOSCOPIC ULTRASOUND (EUS) LINEAR;  Surgeon: Teressa Toribio SQUIBB, Hall;  Location: [facility] ENDOSCOPY;  Service: Endoscopy;  Laterality: N/A;   EYE SURGERY     HEMI-MICRODISCECTOMY LUMBAR LAMINECTOMY LEVEL 1  09/2018   [facility]   IABP INSERTION N/A 09/06/2022   Procedure: IABP Insertion;  Surgeon: Jordan, Peter M, Hall;  Location: [facility] INVASIVE CV LAB;  Service: Cardiovascular;  Laterality: N/A;   LEFT HEART CATH AND CORONARY ANGIOGRAPHY N/A 09/04/2022   Procedure: LEFT HEART CATH AND CORONARY ANGIOGRAPHY;  Surgeon: Mady Bruckner, Hall;  Location: [facility] INVASIVE CV LAB;  Service: Cardiovascular;  Laterality: N/A;   LEFT HEART CATH AND CORONARY ANGIOGRAPHY N/A 09/06/2022   Procedure: LEFT HEART CATH AND CORONARY ANGIOGRAPHY;  Surgeon: Jordan, Peter M, Hall;  Location: [facility] INVASIVE CV LAB;  Service: Cardiovascular;  Laterality: N/A;   NECK SURGERY  2000   TEE WITHOUT CARDIOVERSION N/A 09/07/2022   Procedure: TRANSESOPHAGEAL ECHOCARDIOGRAM;  Surgeon:  Lucas Dorise POUR, Hall;  Location: [facility] OR;  Service: Open Heart Surgery;  Laterality: N/A;   TENNIS ELBOW RELEASE/NIRSCHEL PROCEDURE     TONSILLECTOMY AND ADENOIDECTOMY     Active Ambulatory Problems    Diagnosis Date Noted   Benign prostatic hyperplasia with urinary obstruction 04/06/2015   Interstitial cystitis 04/06/2015   Pancreatic cyst    Impingement syndrome of shoulder region 06/30/2017   Abdominal mass    Hypertriglyceridemia 01/11/2019   History of colon polyps 01/11/2019   History of spinal fusion 03/13/2020   Chronic kidney disease, stage 3b (HCC) 10/11/2021   NSTEMI (non-ST elevated myocardial infarction) (HCC) 09/03/2022   Primary hypertension 09/03/2022   Coronary artery disease 09/04/2022   s/p cabg x 5 09/10/2022   Hyperlipidemia, mixed 11/07/2022   Abnormal urine 04/17/2022   Cyst of kidney, acquired 04/17/2022   Low back pain 06/05/2016   Lumbar post-laminectomy syndrome 12/19/2022   Lumbosacral spondylosis with radiculopathy 06/05/2016   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   Abnormal CT scan, lumbar spine (06/17/2016) 04/02/2023   Chronic anticoagulation (Plavix ) 04/02/2023   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   Chronic low back pain (1ry area of Pain) (Bilateral) w/o sciatica 04/02/2023  Resolved Ambulatory Problems    Diagnosis Date Noted   Actinic keratoses 04/06/2015   Pain in shoulder 04/06/2015   Prediabetes 04/06/2015   Panaritium of toe 04/06/2015   Lumbar radiculopathy 07/30/2016   Cholecystitis 06/02/2017   Choledocholithiasis with acute cholecystitis    Upper abdominal pain    Elevated LFTs    Sinusitis, acute maxillary 06/29/2019   Eustachian tube dysfunction 06/29/2019   Past Medical History:  Diagnosis Date   Abdominal pain 06/02/2017   Anxiety    Arthritis    Basal cell carcinoma 01/21/2017   BPH with obstruction/lower urinary tract symptoms    History of actinic keratoses     Pneumonia    Squamous cell carcinoma of skin 08/14/2016   Constitutional Exam  General appearance: Well nourished, well developed, and well hydrated. In no apparent acute distress Vitals:   04/02/23 1404  BP: 120/77  Pulse: 88  Resp: 18  Temp: 97.8 F (36.6 C)  TempSrc: Temporal  SpO2: 100%  Weight: 190 lb (86.2 kg)  Height: 5' 10 (1.778 m)   BMI Assessment: Estimated body mass index is 27.26 kg/m as calculated from the following:   Height as of this encounter: 5' 10 (1.778 m).   Weight as of this encounter: 190 lb (86.2 kg).  BMI interpretation table: BMI level Category Range association with higher incidence of chronic pain  <18 kg/m2 Underweight   18.5-24.9 kg/m2 Ideal body weight   25-29.9 kg/m2 Overweight Increased incidence by 20%  30-34.9 kg/m2 Obese (Class I) Increased incidence by 68%  35-39.9 kg/m2 Severe obesity (Class II) Increased incidence by 136%  >40 kg/m2 Extreme obesity (Class III) Increased incidence by 254%   Patient's current BMI Ideal Body weight  Body mass index is 27.26 kg/m. Ideal body weight: 73 kg (160 lb 15 oz) Adjusted ideal body weight: 78.3 kg (172 lb 9 oz)   BMI Readings from Last 4 Encounters:  04/02/23 27.26 kg/m  04/01/23 27.78 kg/m  03/03/23 26.98 kg/m  03/03/23 27.98 kg/m   Wt Readings from Last 4 Encounters:  04/02/23 190 lb (86.2 kg)  04/01/23 193 lb 9.6 oz (87.8 kg)  03/03/23 188 lb (85.3 kg)  03/03/23 195 lb (88.5 kg)    Psych/Mental status: Alert, oriented x 3 (person, place, & time)       Eyes: PERLA Respiratory: No evidence of acute respiratory distress  Assessment  Primary Diagnosis & Pertinent Problem List: The primary encounter diagnosis was Chronic low back pain (1ry area of Pain) (Bilateral) w/o sciatica. Diagnoses of Failed back surgical syndrome, Abnormal CT scan, lumbar spine (06/17/2016), Chronic pain syndrome, and Chronic anticoagulation (Plavix ) were also pertinent to this visit.  Visit Diagnosis  (New problems to examiner): 1. 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 Failed back surgical syndrome   3. Abnormal CT scan, lumbar spine (06/17/2016)   4. Chronic pain syndrome   5. Chronic anticoagulation (Plavix )    Plan of Care (Initial workup plan)  Note: Mr. Donald was reminded that as per protocol, today's visit has been an evaluation only. We have not taken over the patient's controlled substance management.  Problem-specific plan: Assessment and Plan    Chronic Lower Back Pain Chronic lower back pain persists with a history of four spine surgeries and five herniated discs, primarily affecting the left hip and worsening with activity. Recent treatments, including cortisone injections and dry needle therapy, have been ineffective. There is no lumbar radiculopathy. Surgical options are not  recommended due to high risk from a recent myocardial infarction and multiple back surgeries. Further injections are limited in efficacy due to scar tissue and potential entrapment neuropathy. A TENS unit was discussed as a non-invasive alternative with minimal side effects. Request MRI results and details of previous procedures from Dr. Bonner. Discuss TENS unit therapy with a physical therapist. Re-evaluate in two weeks after obtaining additional information.  Coronary Artery Disease (CAD) Following a myocardial infarction in July 2024 and subsequent bypass surgery, blood thinners are currently prescribed. The risk for further interventions is high due to the recent MI and multiple bypass surgeries, with a high mortality risk associated with another MI within six months post-event. Continue current cardiac management and blood thinners. Avoid invasive procedures for six months post-MI.  General Health Maintenance The importance of maintaining general health and monitoring for any new symptoms or changes in condition was discussed. Follow up with the primary care physician  for routine health maintenance.  Follow-up A follow-up is scheduled in two weeks to review MRI results and previous procedure details. Call before the appointment to ensure all necessary information has been received.       Lab Orders  No laboratory test(s) ordered today   Imaging Orders  No imaging studies ordered today   Referral Orders  No referral(s) requested today   Procedure Orders    No procedure(s) ordered today   Pharmacotherapy (current): Medications ordered:  No orders of the defined types were placed in this encounter.  Medications administered during this visit: Christopherjame L. Jahr Jr. had no medications administered during this visit.   Analgesic Pharmacotherapy:  Opioid Analgesics: For patients currently taking or requesting to take opioid analgesics, in accordance with [place]  Medical Board Guidelines, we will assess their risks and indications for the use of these substances. After completing our evaluation, we may offer recommendations, but we no longer take patients for medication management. The prescribing physician will ultimately decide, based on his/her training and level of comfort whether to adopt any of the recommendations, including whether or not to prescribe such medicines.  Membrane stabilizer: To be determined at a later time  Muscle relaxant: To be determined at a later time  NSAID: To be determined at a later time  Other analgesic(s): To be determined at a later time   Interventional management options: Mr. Dubie was informed that there is no guarantee that he would be a candidate for interventional therapies. The decision will be based on the results of diagnostic studies, as well as Mr. Lard risk profile.  Procedure(s) under consideration:  Pending results of ordered studies      Interventional Therapies  Risk Factors  Considerations  Medical Comorbidities:  Plavix  Anticoagulation: (Stop: 7-10 days  Restart: 2 hours)  CAD   Hx. NSTEMI  S/P CABG x5  Stage 3b CKD  T2NIDDM     Planned  Pending:      Under consideration:   Pending   Completed:   None at this time   Therapeutic  Palliative (PRN) options:   None established   Completed by other providers:   Surgery: Right L2-3 lateral transpsoas interbody fusion w/ lateral plating and L2-3 decompression (07/30/2016) by Morene DOROTHA Stammer, [place] (Neurosurgery)  Surgery: Left L3-4 and L4-5 anterolateral fusion with interbody spacer and right L3-4 and L4-5 pedicle screws (03/13/2010) by Darina Boehringer, [place] (Neurosurgery)  [facility] Procedures Date Name LateralityProvider Name and Address 12/03/2022 G-[facility]  Charlie Dolores, [place] 01/17/2022 G-R[facility] 2 Level                                          Charlie Dolores, [place] 07/03/2021 G-R[facility] 2 Level                               Manuelita Shoulder 05/01/2021 G-JCB Trigger Point                                 Reyes Billing, [place] 12/04/2020 G-[facility] 2 Level                      Charlie Dolores, [place] 07/18/2020 G-RDR [facility]                                              Charlie Dolores, [place] 01/17/2020 [email or web address] Lumbar TFESIK U1 Level  Charleen Martins, [place] 09/25/2018 Lumbar Laminectomy    09/20/2018 LAMINOTOMY (HEMILAMINECTOMY), DECOMPRESSION OF NERVE ROOTS, PARTIAL FACECTOTOMY, FORAMINOTOMY AND/OR DISC REMOVAL, LUMBAR (SURG) Shanda Lenis 07/30/2016 Lumbar Laminectomy   April Webb 03/07/2011 Lumbar Laminectomy   April Webb 03/06/2001 Lumbar Laminectomy        Provider-requested follow-up: Return in about 2 weeks (around 04/16/2023) for ( ), Eval-day (M,W), (F2F), 2nd Visit (review MRI & Ramos' Procedure Notes).  Future Appointments  Date Time Provider Department Center  04/07/2023 11:15 AM Juli Camie SAUNDERS, PT [facility] None  04/09/2023 10:30 AM Juli Camie SAUNDERS, PT [facility] None  04/09/2023  4:00 PM [facility]  1 [facility] None  04/14/2023  9:40 AM Rodney Nancyann BRAVO, Hall [facility]   04/14/2023  1:00 PM Juli Camie SAUNDERS, PT [facility] None  04/17/2023  1:45 PM Juli Camie SAUNDERS, PT [facility] None  04/21/2023  1:45 PM Juli Camie SAUNDERS, PT [facility] None  04/24/2023  1:00 PM Juli Camie SAUNDERS, PT [facility] None  04/28/2023  1:00 PM Juli Camie SAUNDERS, PT [facility] None  04/30/2023  1:00 PM Juli Camie SAUNDERS, PT [facility] None  06/05/2023  1:30 PM Abigail Bernardino HERO, PA-C [facility] None  07/08/2023 10:10 AM [facility]NUAL WELLNESS VISIT [facility]  08/13/2023  8:45 AM Hester Alm BROCKS, Hall [facility] None  12/04/2023  1:00 PM Francisca Redell BROCKS, Hall [facility] None   Duration of encounter: 56 minutes.  Total time on encounter, as per AMA guidelines included both the face-to-face and non-face-to-face time personally spent by the physician and/or other qualified health care professional(s) on the day of the encounter (includes time in activities that require the physician or other qualified health care professional and does not include time in activities normally performed by clinical staff). Physician's time may include the following activities when performed: Preparing to see the patient (e.g., pre-charting review of records, searching for previously ordered imaging, lab work, and nerve conduction tests) Review of prior analgesic pharmacotherapies. Reviewing PMP Interpreting ordered tests (e.g., lab work, imaging, nerve conduction tests) Performing post-procedure evaluations, including interpretation of diagnostic procedures Obtaining and/or reviewing separately obtained history Performing a medically appropriate examination and/or evaluation Counseling and educating the patient/family/caregiver Ordering medications, tests, or procedures Referring and communicating with other health care professionals (when  not separately reported) Documenting clinical information in the electronic or other health record Independently interpreting results (not separately reported) and communicating results to the patient/  family/caregiver Care coordination (not separately reported)  Note by: Rodney DELENA Como, [place] (AI and TTS technology used. I apologize for any typographical errors that were not detected and corrected.) Date: 04/02/2023; Time: 2:58 PM

## 2023-04-01 NOTE — Patient Instructions (Addendum)
Medication Instructions:  Your physician recommends that you continue on your current medications as directed. Please refer to the Current Medication list given to you today.  *If you need a refill on your cardiac medications before your next appointment, please call your pharmacy*  Lab Work: CBC, Direct LDL, Lipid Panel, CMET today If you have labs (blood work) drawn today and your tests are completely normal, you will receive your results only by: MyChart Message (if you have MyChart) OR A paper copy in the mail If you have any lab test that is abnormal or we need to change your treatment, we will call you to review the results.  Testing/Procedures: Your physician has requested that you have an echocardiogram. Echocardiography is a painless test that uses sound waves to create images of your heart. It provides your doctor with information about the size and shape of your heart and how well your heart's chambers and valves are working. This procedure takes approximately one hour. There are no restrictions for this procedure. Please do NOT wear cologne, perfume, aftershave, or lotions (deodorant is allowed). Please arrive 15 minutes prior to your appointment time.   Follow-Up: At [facility], you and your health needs are our priority.  As part of our continuing mission to provide you with exceptional heart care, we have created designated Provider Care Teams.  These Care Teams include your primary Cardiologist (physician) and Advanced Practice Providers (APPs -  Physician Assistants and Nurse Practitioners) who all work together to provide you with the care you need, when you need it.   Your next appointment:   2 month(s)  Provider:   Eula Listen, PA

## 2023-04-02 ENCOUNTER — Ambulatory Visit: Payer: HMO | Admitting: Pain Medicine

## 2023-04-02 ENCOUNTER — Ambulatory Visit: Payer: HMO | Admitting: Physical Therapy

## 2023-04-02 ENCOUNTER — Encounter: Payer: Self-pay | Admitting: Pain Medicine

## 2023-04-02 ENCOUNTER — Encounter: Payer: Self-pay | Admitting: Physical Therapy

## 2023-04-02 VITALS — BP 120/77 | HR 88 | Temp 97.8°F | Resp 18 | Ht 70.0 in | Wt 190.0 lb

## 2023-04-02 DIAGNOSIS — M961 Postlaminectomy syndrome, not elsewhere classified: Secondary | ICD-10-CM | POA: Insufficient documentation

## 2023-04-02 DIAGNOSIS — M545 Low back pain, unspecified: Secondary | ICD-10-CM

## 2023-04-02 DIAGNOSIS — G894 Chronic pain syndrome: Secondary | ICD-10-CM

## 2023-04-02 DIAGNOSIS — M5459 Other low back pain: Secondary | ICD-10-CM | POA: Diagnosis not present

## 2023-04-02 DIAGNOSIS — R937 Abnormal findings on diagnostic imaging of other parts of musculoskeletal system: Secondary | ICD-10-CM | POA: Insufficient documentation

## 2023-04-02 DIAGNOSIS — Z7901 Long term (current) use of anticoagulants: Secondary | ICD-10-CM | POA: Diagnosis not present

## 2023-04-02 DIAGNOSIS — M792 Neuralgia and neuritis, unspecified: Secondary | ICD-10-CM

## 2023-04-02 DIAGNOSIS — G8929 Other chronic pain: Secondary | ICD-10-CM | POA: Insufficient documentation

## 2023-04-02 DIAGNOSIS — R262 Difficulty in walking, not elsewhere classified: Secondary | ICD-10-CM

## 2023-04-02 LAB — LIPID PANEL
Chol/HDL Ratio: 4.2 ratio (ref 0.0–5.0)
Cholesterol, Total: 147 mg/dL (ref 100–199)
HDL: 35 mg/dL — ABNORMAL LOW
LDL Chol Calc (NIH): 76 mg/dL (ref 0–99)
Triglycerides: 219 mg/dL — ABNORMAL HIGH (ref 0–149)
VLDL Cholesterol Cal: 36 mg/dL (ref 5–40)

## 2023-04-02 LAB — CBC
Hematocrit: 36.9 % — ABNORMAL LOW (ref 37.5–51.0)
Hemoglobin: 11.7 g/dL — ABNORMAL LOW (ref 13.0–17.7)
MCH: 29 pg (ref 26.6–33.0)
MCHC: 31.7 g/dL (ref 31.5–35.7)
MCV: 92 fL (ref 79–97)
Platelets: 329 10*3/uL (ref 150–450)
RBC: 4.03 x10E6/uL — ABNORMAL LOW (ref 4.14–5.80)
RDW: 12.4 % (ref 11.6–15.4)
WBC: 7.3 10*3/uL (ref 3.4–10.8)

## 2023-04-02 LAB — COMPREHENSIVE METABOLIC PANEL WITH GFR
ALT: 9 [IU]/L (ref 0–44)
AST: 12 [IU]/L (ref 0–40)
Albumin: 4.3 g/dL (ref 3.8–4.8)
Alkaline Phosphatase: 76 [IU]/L (ref 44–121)
BUN/Creatinine Ratio: 9 — ABNORMAL LOW (ref 10–24)
BUN: 13 mg/dL (ref 8–27)
Bilirubin Total: 0.2 mg/dL (ref 0.0–1.2)
CO2: 22 mmol/L (ref 20–29)
Calcium: 9.9 mg/dL (ref 8.6–10.2)
Chloride: 103 mmol/L (ref 96–106)
Creatinine, Ser: 1.38 mg/dL — ABNORMAL HIGH (ref 0.76–1.27)
Globulin, Total: 2.9 g/dL (ref 1.5–4.5)
Glucose: 125 mg/dL — ABNORMAL HIGH (ref 70–99)
Potassium: 4.8 mmol/L (ref 3.5–5.2)
Sodium: 139 mmol/L (ref 134–144)
Total Protein: 7.2 g/dL (ref 6.0–8.5)
eGFR: 53 mL/min/{1.73_m2} — ABNORMAL LOW

## 2023-04-02 LAB — LDL CHOLESTEROL, DIRECT: LDL Direct: 74 mg/dL (ref 0–99)

## 2023-04-02 NOTE — Therapy (Signed)
OUTPATIENT PHYSICAL THERAPY TREATMENT   Patient Name: Rodney Hall. MRN: [id number] DOB:15-Nov-1945, 78 y.o., male Today's Date: 04/02/2023  END OF SESSION:  PT End of Session - 04/02/23 1016     Visit Number 7    Number of Visits 13    Date for PT Re-Evaluation 05/27/23    Authorization Type [employer] reporting period from 03/04/2023    PT Start Time 0902    PT Stop Time 0946    PT Time Calculation (min) 44 min    Activity Tolerance Patient tolerated treatment well    Behavior During Therapy [facility] for tasks assessed/performed                Past Medical History:  Diagnosis Date   Abdominal mass    Abdominal pain 06/02/2017   Anxiety    Pt. denies   Arthritis    pt. 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   Past Surgical History:  Procedure Laterality Date   ANTERIOR LAT LUMBAR FUSION Right 07/30/2016   Procedure: Right Lateral two-three Lateral transpsoas interbody fusion with lateral plating/Minimally invasive decompression at Lumbar two-three;  Surgeon: Ditty, Loura Halt, MD;  Location: [facility] OR;  Service: Neurosurgery;  Laterality: Right;   BACK SURGERY  2012   CATARACT EXTRACTION, BILATERAL     CHOLECYSTECTOMY N/A 07/25/2017   Procedure: LAPAROSCOPIC CHOLECYSTECTOMY WITH INTRAOPERATIVE CHOLANGIOGRAM;  Surgeon: Luretha Murphy, MD;  Location: [facility] ORS;  Service: General;  Laterality: N/A;   COLONOSCOPY WITH PROPOFOL N/A 12/12/2017   Procedure: COLONOSCOPY WITH PROPOFOL;  Surgeon: Scot Jun, MD;  Location: [facility] ENDOSCOPY;  Service: Endoscopy;  Laterality: N/A;   CORONARY ARTERY BYPASS GRAFT N/A  09/07/2022   Procedure: CORONARY ARTERY BYPASS GRAFTING (CABG) TIMES FIVE, USING LEFT INTERNAL MAMMARY ARTERY AND RIGHT GREATER SAPHENOUS VEIN HARVESTED ENDOSCOPICALLY;  Surgeon: [name], MD;  Location: [facility] OR;  Service: Open Heart Surgery;  Laterality: N/A;   EUS N/A 07/10/2017   Procedure: UPPER ENDOSCOPIC ULTRASOUND (EUS) LINEAR;  Surgeon: Rachael Fee, MD;  Location: [facility] ENDOSCOPY;  Service: Endoscopy;  Laterality: N/A;   EYE SURGERY     HEMI-MICRODISCECTOMY LUMBAR LAMINECTOMY LEVEL 1  09/2018   [facility]   IABP INSERTION N/A 09/06/2022   Procedure: IABP Insertion;  Surgeon: [place]azil[place], Peter M, MD;  Location: [facility] INVASIVE CV LAB;  Service: Cardiovascular;  Laterality: N/A;   LEFT HEART CATH AND CORONARY ANGIOGRAPHY N/A 09/04/2022   Procedure: LEFT HEART CATH AND CORONARY ANGIOGRAPHY;  Surgeon: Yvonne Kendall, MD;  Location: [facility] INVASIVE CV LAB;  Service: Cardiovascular;  Laterality: N/A;   LEFT HEART CATH AND CORONARY ANGIOGRAPHY N/A 09/06/2022   Procedure: LEFT HEART CATH AND CORONARY ANGIOGRAPHY;  Surgeon: [place]il[place], Peter M, MD;  Location: [facility] INVASIVE CV LAB;  Service: Cardiovascular;  Laterality: N/A;   NECK SURGERY  2000   TEE WITHOUT CARDIOVERSION N/A 09/07/2022   Procedure: TRANSESOPHAGEAL ECHOCARDIOGRAM;  Surgeon: [name], MD;  Location: [facility] OR;  Service: Open Heart Surgery;  Laterality: N/A;   TENNIS ELBOW RELEASE/NIRSCHEL PROCEDURE     TONSILLECTOMY AND ADENOIDECTOMY  Patient Active Problem List   Diagnosis Date Noted   Hyperlipidemia, mixed 11/07/2022   s/p cabg x 5 09/10/2022   Coronary artery disease 09/04/2022   NSTEMI (non-ST elevated myocardial infarction) (HCC) 09/03/2022   Primary hypertension 09/03/2022   Chronic kidney disease, stage 3b (HCC) 10/11/2021   History of spinal fusion 03/13/2020   Hypertriglyceridemia 01/11/2019   History of colon polyps 01/11/2019   Abdominal mass    Impingement syndrome of shoulder region 06/30/2017   Pancreatic  cyst    Benign prostatic hyperplasia with urinary obstruction 04/06/2015   Prediabetes 04/06/2015   Interstitial cystitis 04/06/2015    PCP: Malva Limes, MD  REFERRING PROVIDER: Arman Bogus, MD (neurosurgery)  REFERRING DIAG: other spondylosis with radiculopathy, lumbosacral region  Rationale for Evaluation and Treatment: Rehabilitation  THERAPY DIAG:  Other low back pain  Difficulty in walking, not elsewhere classified  Neuralgia and neuritis  ONSET DATE: chronic with history of multiple surgeries over the years (at least 10 years ago), but recently has had more pain in the last 3 months prior to PT evaluation.   SUBJECTIVE:                                                                                                                                                                                           PERTINENT HISTORY:  Patient is a 78 y.o. male who presents to outpatient physical therapy with a referral for medical diagnosis other spondylosis with radiculopathy, lumbosacral region. This patient's chief complaints consist of worsening of chronic low back and left glute pain leading to the following functional deficits: difficulty anything that requires standing or walking, using a push mower, cleaning up cars, going to the grocery store/shopping, walking > 5 min, standing > 3 min. Relevant past medical history and comorbidities include  Benign prostatic hyperplasia with urinary obstruction; Prediabetes; Interstitial cystitis; Pancreatic cyst; Impingement syndrome of shoulder region; Abdominal mass; Hypertriglyceridemia; History of colon polyps; History of spinal fusion; Chronic kidney disease, stage 3b (HCC); NSTEMI (non-ST elevated myocardial infarction) (HCC); Primary hypertension; Coronary artery disease; s/p cabg x 5; and Hyperlipidemia, mixed on their problem list. Has PMH of Abdominal pain (06/02/2017), Anxiety, Arthritis, Basal cell carcinoma (01/21/2017), BPH  with obstruction/lower urinary tract symptoms,Choledocholithiasis with acute cholecystitis, History of actinic keratoses, Interstitial cystitis, Pancreatic cyst, and Squamous cell carcinoma of skin (08/14/2016).  has a past surgical history that includes Back surgery (2012); Neck surgery (2000); Cataract extraction, bilateral; Tonsillectomy and adenoidectomy; Anterior lat lumbar fusion (Right, 07/30/2016); EUS (N/A, 07/10/2017); Tennis elbow release/nirschel procedure; Eye surgery; Cholecystectomy (N/A, 07/25/2017); Colonoscopy with propofol (N/A, 12/12/2017); Hemi-microdiscectomy lumbar laminectomy level 1 (  09/2018); LEFT HEART CATH AND CORONARY ANGIOGRAPHY (N/A, 09/04/2022); Coronary artery bypass graft (N/A, 09/07/2022); TEE without cardioversion (N/A, 09/07/2022); LEFT HEART CATH AND CORONARY ANGIOGRAPHY (N/A, 09/06/2022); and IABP Insertion (N/A, 09/06/2022). Patient denies hx of stroke, seizures, lung problems, diabetes, unexplained weight loss, unexplained changes in bowel or bladder problems, unexplained stumbling or dropping things, and osteoporosis  SUBJECTIVE STATEMENT: Patient arrives to session feeling more pain with sitting and walking today. Patient reports relief from dry needling lasted a few days but then wore off. Patient had appointment with cardiologist yesterday for increased shortness of breath and they did a CBC/scheduled an echo for next week. He reports not being giving any precautions from cardiologist. Patient states HEP has been going well but continues to have a hard time with squatting exercises due to shortness of breath with exertion.    PAIN: Are you having pain? Yes NPRS: Current: 4/10 in standing, 1/10 while sitting  Pain location: left glute and base of spine    LEISURE: doing stuff around the house, mowing the yard, cleaning cars up  PRECAUTIONS: None  PATIENT GOALS: "would like the pain to go away in my left hip"  NEXT MD VISIT: no appointment scheduled  currently  OBJECTIVE  TREATMENT                                                                                                                            Therapeutic exercise: to centralize symptoms and improve ROM, strength, muscular endurance, and activity tolerance required for successful completion of functional activities.   Bridges with toes elevated   1x20   Bridges with staggered stance  2x10 each leg    (Manual therapy / dry needling - see below)  Self-STM to piriformis with lacrosse ball, approximately 3 minutes   Education on HEP including handout   Therapeutic activities: dynamic activities for functional strengthening and improved functional activity tolerance.   Goblet Squat with 15# DB  3x10  1 min rest after each set     Pallof Press    2x10 each side with 5#   1 min rest after each set        Pt required multimodal cuing for proper technique and to facilitate improved neuromuscular control, strength, range of motion, and functional ability resulting in improved performance and form.  Manual therapy: to reduce pain and tissue tension, improve range of motion, neuromodulation, in order to promote improved ability to complete functional activities. PRONE STM to left glutes, deep hip rotators, piriformis   Trigger Point Dry Needling  Subsequent Treatment: Instructions provided previously at initial dry needling treatment.   Instructions reviewed, if requested by the patient, prior to subsequent dry needling treatment.   Patient Verbal Consent Given: Yes   Education Handout Provided: Previously Provided Muscles Treated: 1 dry needle(s) .30mm x inserted with 1 sticks into L posteromedial glute max/med muscles distal and lateral to L SIJ and 2 sticks into left piriformis with patient  in prone position to decrease pain and spasms along patient's left posterior pelvis/glute region.   Electrical Stimulation Performed: No Treatment Response/Outcome:  Concordant pain reproduced with radiation down leg and tightening of muscle around needle felt by PT at 2nd piriformis site (closer to sacrum). Leg pain gone after needle removed. No abnormal response noted.   Dry needling performed exclusively by Huntley Dec. Henrietta Hoover, PT, DPT who is certified in the technique.   PATIENT EDUCATION:  Education details:  Exercise purpose/form. Self management techniques.  Education on HEP including handout. Dry needling Person educated: Patient Education method: Explanation, Demonstration, Tactile cues, Verbal cues, and Handouts Education comprehension: verbalized understanding, returned demonstration, and needs further education  HOME EXERCISE PROGRAM: Access Code: [email or web address] URL: [email or web address] Date: 04/02/2023 Prepared by: Cedric Denison [place]  Exercises - Seated Slump Nerve Glide  - 2 x daily - 1 sets - 20 reps - Supine Piriformis Stretch with Leg Straight  - 2 x daily - 3 sets - 1 minute hold - Goblet Squat with Kettlebell  - 3-4 x weekly - 3 sets - 20 reps - Supine Lower Trunk Rotation with Swiss Ball  - 2 x daily - 1 sets - 20 reps - Hamstring Curl with Weight Machine  - 3-4 x weekly - 3 sets - 20 reps - Supine Hamstring Stretch with Strap  - 2 x daily - 1 sets - 10 reps - 5 seconds hold - Seated Shoulder Row with Anchored Resistance  - 3 x weekly - 2 sets - 20 reps - Standing Anti-Rotation Press with Anchored Resistance  - 3 x weekly - 2 sets - 10 reps - Staggered Bridge  - 3 x weekly - 2 sets - 10 reps   HOME EXERCISE PROGRAM [[email or web address]]  Self-Mobilization to Glutes with [employer]  -  Hold 5 Minutes max, Perform 1-2 Times a Day  ASSESSMENT:  CLINICAL IMPRESSION: Patient arrives to session reporting slight increase in left back pain/glute pain with sitting and standing. The focus of today's session was to continue with functional strengthening/endurance of the lower extremities, abdominals, and postural muscles with modifications to  attempt reducing shortness of breath patient feels during exercise. Patient tolerance to exercises significantly improved with reducing the number of repetitions in each set, providing verbal cuing to focus on breathing during exercise, and seated 1 minute rest breaks following each set. Self-mobilization of the left glutes/deep hip rotators/piriformis with lacrosse ball introduced to decrease pain/tissue tension and promote self-management of symptoms as home. Patient tolerated interventions well and was able to complete all exercises with no increase in pain. Patient underwent dry needling to the left posteromedial glute max/med muscles distal and lateral to L SIJ and left piriformis to decrease pain and spasms along patient's left posterior pelvis/glute region. Patient provided handout with updated HEP to include well tolerated exercises. Patient reports feeling better and more "limber" at end of session. Patient states experiencing less shortness of breath with exercise modifications and no dizziness during today's session. Patient would benefit from continued management of limiting condition by skilled physical therapist to address remaining impairments and functional limitations to work towards stated goals and return to PLOF or maximal functional independence.   From Initial PT evaluation on 03/03/2024: Patient is a 78 y.o. male referred to outpatient physical therapy with a medical diagnosis of other spondylosis with radiculopathy, lumbosacral region who presents with signs and symptoms consistent with chronic low back pain with referral to left gluteal notch with positive bilateral SLR suggesting nerve root irritation  as a contributing factor. He presents with pattern consistent with symptomatic lumbar stenosis.  Patient presents with significant pain, posture, ROM, joint stiffness, neurodynamic, muscle tension, muscle performance (strength/power/endurance) and activity tolerance impairments that are  limiting ability to complete anything that requires standing or walking, using a push mower, cleaning up cars, going to the grocery store/shopping, walking > 5 min, standing > 3 min without difficulty. Patient will benefit from skilled physical therapy intervention to address current body structure impairments and activity limitations to improve function and work towards goals set in current POC in order to return to prior level of function or maximal functional improvement.   OBJECTIVE IMPAIRMENTS: Abnormal gait, decreased activity tolerance, decreased balance, decreased endurance, decreased knowledge of condition, decreased knowledge of use of DME, decreased mobility, difficulty walking, decreased ROM, decreased strength, hypomobility, impaired perceived functional ability, increased muscle spasms, impaired flexibility, improper body mechanics, postural dysfunction, and pain.   ACTIVITY LIMITATIONS: carrying, lifting, standing, sleeping, stairs, reach over head, locomotion level, and caring for others  PARTICIPATION LIMITATIONS: meal prep, cleaning, laundry, interpersonal relationship, shopping, community activity, yard work, and   difficulty anything that requires standing or walking, using a push mower, cleaning up cars, going to the grocery store/shopping, walking > 5 min, standing > 3 min  PERSONAL FACTORS: Age, Past/current experiences, Time since onset of injury/illness/exacerbation, and 3+ comorbidities:   Benign prostatic hyperplasia with urinary obstruction; Prediabetes; Interstitial cystitis; Pancreatic cyst; Impingement syndrome of shoulder region; Abdominal mass; Hypertriglyceridemia; History of colon polyps; History of spinal fusion; Chronic kidney disease, stage 3b (HCC); NSTEMI (non-ST elevated myocardial infarction) (HCC); Primary hypertension; Coronary artery disease; s/p cabg x 5; and Hyperlipidemia, mixed on their problem list. Has PMH of Abdominal pain (06/02/2017), Anxiety, Arthritis,  Basal cell carcinoma (01/21/2017), BPH with obstruction/lower urinary tract symptoms,Choledocholithiasis with acute cholecystitis, History of actinic keratoses, Interstitial cystitis, Pancreatic cyst, and Squamous cell carcinoma of skin (08/14/2016).  has a past surgical history that includes Back surgery (2012); Neck surgery (2000); Cataract extraction, bilateral; Tonsillectomy and adenoidectomy; Anterior lat lumbar fusion (Right, 07/30/2016); EUS (N/A, 07/10/2017); Tennis elbow release/nirschel procedure; Eye surgery; Cholecystectomy (N/A, 07/25/2017); Colonoscopy with propofol (N/A, 12/12/2017); Hemi-microdiscectomy lumbar laminectomy level 1 (09/2018); LEFT HEART CATH AND CORONARY ANGIOGRAPHY (N/A, 09/04/2022); Coronary artery bypass graft (N/A, 09/07/2022); TEE without cardioversion (N/A, 09/07/2022); LEFT HEART CATH AND CORONARY ANGIOGRAPHY (N/A, 09/06/2022); and IABP Insertion (N/A, 09/06/2022) are also affecting patient's functional outcome.   REHAB POTENTIAL: Fair due to nature and severity of condition and previously attempted intervention without success.   CLINICAL DECISION MAKING: Evolving/moderate complexity  EVALUATION COMPLEXITY: Moderate   GOALS: Goals reviewed with patient? No  SHORT TERM GOALS: Target date: 03/18/2023  Patient will be independent with initial home exercise program for self-management of symptoms. Baseline: Initial HEP to be provided at visit 2 as appropriate (03/04/23); initial HEP provided at visit #2 (03/17/2023);  Goal status: In progress   LONG TERM GOALS: Target date: 05/27/2023  Patient will be independent with a long-term home exercise program for self-management of symptoms.  Baseline: Initial HEP to be provided at visit 2 as appropriate (03/04/23); Initial HEP to be provided at visit 2 as appropriate (03/04/23); initial HEP provided at visit #2 (03/17/2023);  Goal status: In progress  2.  Patient will demonstrate improved FOTO to equal or greater than 47 by visit  #14 to demonstrate improvement in overall condition and self-reported functional ability.  Baseline: 33 (03/04/23); Goal status: In progress  3.  Patient will ambulate 1729 feet during  6 Minute Walk Test which is his age matched norm to demonstrate improved ability to walk community distances and go shopping.  Baseline: to be tested visit 2 as appropriate (03/04/23); 1152 feet with no AD, up to 8/10 pain in left glute and developed antalgic gait by end of set (03/17/2023);  Goal status: In progress  4.  Patient will report left glute pain of equal or less than 3/10 with functional activities to improve his ability to perform activities that require standing and walking.  Baseline: up to 8/10 (03/04/23); Goal status: In progress  5.  Patient will demonstrate improvement in Patient Specific Functional Scale (PSFS) of equal or greater than 3 points to reflect clinically significant improvement in patient's most valued functional activities. Baseline: to be measured visit 2 as appropriate (03/04/23); 2/10 (03/17/2023) Goal status: In progress   PLAN:  PT FREQUENCY: 1-2x/week  PT DURATION: 8-12 weeks  PLANNED INTERVENTIONS: 97164- PT Re-evaluation, 97110-Therapeutic exercises, 97530- Therapeutic activity, [id number]- Neuromuscular re-education, 97535- Self Care, 40981- Manual therapy, [id number]- Gait training, [phone]- Aquatic Therapy, 97014- Electrical stimulation (unattended), [phone]- Electrical stimulation (manual), Patient/Family education, Balance training, Dry Needling, Joint mobilization, Spinal mobilization, DME instructions, Cryotherapy, and Moist heat.  PLAN FOR NEXT SESSION: Update HEP as appropriate, dry needling to left glute region as appropriate and per patient/MD preference, progressive trunk/LE/functional strengthening and ROM exercises as tolerated. Manual therapy as needed. Education.   Exzavier Ruderman [place], SPT [employer] DPTE  Huntley Dec R. Ilsa Iha, PT, DPT 04/02/23, 11:58 AM  [facility]  [facility] [address] [place], [place] [address] P: [phone] I F: [phone]

## 2023-04-02 NOTE — Progress Notes (Signed)
Safety precautions to be maintained throughout the outpatient stay will include: orient to surroundings, keep bed in low position, maintain call bell within reach at all times, provide assistance with transfer out of bed and ambulation.   ROI faxed to [facility] in [facility] Dr. Marikay Alar for MRI results and procedure notes.

## 2023-04-07 ENCOUNTER — Ambulatory Visit: Payer: HMO | Attending: Neurological Surgery | Admitting: Physical Therapy

## 2023-04-07 ENCOUNTER — Encounter: Payer: Self-pay | Admitting: Physical Therapy

## 2023-04-07 DIAGNOSIS — R262 Difficulty in walking, not elsewhere classified: Secondary | ICD-10-CM | POA: Insufficient documentation

## 2023-04-07 DIAGNOSIS — M5459 Other low back pain: Secondary | ICD-10-CM | POA: Diagnosis present

## 2023-04-07 DIAGNOSIS — M792 Neuralgia and neuritis, unspecified: Secondary | ICD-10-CM | POA: Insufficient documentation

## 2023-04-07 NOTE — Therapy (Signed)
OUTPATIENT PHYSICAL THERAPY TREATMENT   Patient Name: Rodney Hall. MRN: [id number] DOB:Sep 06, 1945, 78 y.o., male Today's Date: 04/07/2023  END OF SESSION:  PT End of Session - 04/07/23 1233     Visit Number 8    Number of Visits 13    Date for PT Re-Evaluation 05/27/23    Authorization Type [employer] reporting period from 03/04/2023    PT Start Time 1117    PT Stop Time 1208    PT Time Calculation (min) 51 min    Activity Tolerance Patient tolerated treatment well    Behavior During Therapy [facility] for tasks assessed/performed                 Past Medical History:  Diagnosis Date   Abdominal mass    Abdominal pain 06/02/2017   Anxiety    Pt. denies   Arthritis    pt. 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   Past Surgical History:  Procedure Laterality Date   ANTERIOR LAT LUMBAR FUSION Right 07/30/2016   Procedure: Right Lateral two-three Lateral transpsoas interbody fusion with lateral plating/Minimally invasive decompression at Lumbar two-three;  Surgeon: Ditty, Loura Halt, MD;  Location: [facility] OR;  Service: Neurosurgery;  Laterality: Right;   BACK SURGERY  2012   CATARACT EXTRACTION, BILATERAL     CHOLECYSTECTOMY N/A 07/25/2017   Procedure: LAPAROSCOPIC CHOLECYSTECTOMY WITH INTRAOPERATIVE CHOLANGIOGRAM;  Surgeon: Luretha Murphy, MD;  Location: [facility] ORS;  Service: General;  Laterality: N/A;   COLONOSCOPY WITH PROPOFOL N/A 12/12/2017   Procedure: COLONOSCOPY WITH PROPOFOL;  Surgeon: Scot Jun, MD;  Location: [facility] ENDOSCOPY;  Service: Endoscopy;  Laterality: N/A;   CORONARY ARTERY BYPASS GRAFT N/A  09/07/2022   Procedure: CORONARY ARTERY BYPASS GRAFTING (CABG) TIMES FIVE, USING LEFT INTERNAL MAMMARY ARTERY AND RIGHT GREATER SAPHENOUS VEIN HARVESTED ENDOSCOPICALLY;  Surgeon: [name], MD;  Location: [facility] OR;  Service: Open Heart Surgery;  Laterality: N/A;   EUS N/A 07/10/2017   Procedure: UPPER ENDOSCOPIC ULTRASOUND (EUS) LINEAR;  Surgeon: Rachael Fee, MD;  Location: [facility] ENDOSCOPY;  Service: Endoscopy;  Laterality: N/A;   EYE SURGERY     HEMI-MICRODISCECTOMY LUMBAR LAMINECTOMY LEVEL 1  09/2018   [facility]   IABP INSERTION N/A 09/06/2022   Procedure: IABP Insertion;  Surgeon: Swaziland, Peter M, MD;  Location: [facility] INVASIVE CV LAB;  Service: Cardiovascular;  Laterality: N/A;   LEFT HEART CATH AND CORONARY ANGIOGRAPHY N/A 09/04/2022   Procedure: LEFT HEART CATH AND CORONARY ANGIOGRAPHY;  Surgeon: Yvonne Kendall, MD;  Location: [facility] INVASIVE CV LAB;  Service: Cardiovascular;  Laterality: N/A;   LEFT HEART CATH AND CORONARY ANGIOGRAPHY N/A 09/06/2022   Procedure: LEFT HEART CATH AND CORONARY ANGIOGRAPHY;  Surgeon: Swaziland, Peter M, MD;  Location: [facility] INVASIVE CV LAB;  Service: Cardiovascular;  Laterality: N/A;   NECK SURGERY  2000   TEE WITHOUT CARDIOVERSION N/A 09/07/2022   Procedure: TRANSESOPHAGEAL ECHOCARDIOGRAM;  Surgeon: [name], MD;  Location: [facility] OR;  Service: Open Heart Surgery;  Laterality: N/A;   TENNIS ELBOW RELEASE/NIRSCHEL PROCEDURE     TONSILLECTOMY AND ADENOIDECTOMY  Patient Active Problem List   Diagnosis Date Noted   Abnormal CT scan, lumbar spine (06/17/2016) 04/02/2023   Chronic anticoagulation (Plavix) 04/02/2023   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   Chronic low back pain (1ry area of Pain) (Bilateral) w/o sciatica 04/02/2023   Lumbar post-laminectomy syndrome 12/19/2022   Hyperlipidemia, mixed 11/07/2022   s/p cabg x 5 09/10/2022   Coronary artery disease 09/04/2022   NSTEMI (non-ST elevated myocardial infarction) (HCC) 09/03/2022   Primary  hypertension 09/03/2022   Abnormal urine 04/17/2022   Cyst of kidney, acquired 04/17/2022   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   Chronic kidney disease, stage 3b (HCC) 10/11/2021   History of spinal fusion 03/13/2020   Hypertriglyceridemia 01/11/2019   History of colon polyps 01/11/2019   Abdominal mass    Impingement syndrome of shoulder region 06/30/2017   Pancreatic cyst    Low back pain 06/05/2016   Lumbosacral spondylosis with radiculopathy 06/05/2016   Benign prostatic hyperplasia with urinary obstruction 04/06/2015   Interstitial cystitis 04/06/2015    PCP: Malva Limes, MD  REFERRING PROVIDER: Arman Bogus, MD (neurosurgery)  REFERRING DIAG: other spondylosis with radiculopathy, lumbosacral region  Rationale for Evaluation and Treatment: Rehabilitation  THERAPY DIAG:  Other low back pain  Difficulty in walking, not elsewhere classified  Neuralgia and neuritis  ONSET DATE: chronic with history of multiple surgeries over the years (at least 10 years ago), but recently has had more pain in the last 3 months prior to PT evaluation.   SUBJECTIVE:                                                                                                                                                                                           PERTINENT HISTORY:  Patient is a 78 y.o. male who presents to outpatient physical therapy with a referral for medical diagnosis other spondylosis with radiculopathy, lumbosacral region. This patient's chief complaints consist of worsening of chronic low back and left glute pain leading to the following functional deficits: difficulty anything that requires standing or walking, using a push mower, cleaning up cars, going to the grocery store/shopping, walking > 5 min, standing > 3 min. Relevant past medical history and comorbidities include  Benign prostatic hyperplasia with urinary obstruction;  Prediabetes; Interstitial cystitis; Pancreatic cyst; Impingement syndrome of shoulder region; Abdominal mass; Hypertriglyceridemia; History of colon polyps; History of spinal fusion; Chronic kidney disease, stage 3b (HCC); NSTEMI (non-ST elevated myocardial infarction) (HCC); Primary hypertension; Coronary artery disease; s/p cabg x 5; and Hyperlipidemia, mixed on their problem list. Has PMH of Abdominal pain (06/02/2017), Anxiety, Arthritis,  Basal cell carcinoma (01/21/2017), BPH with obstruction/lower urinary tract symptoms,Choledocholithiasis with acute cholecystitis, History of actinic keratoses, Interstitial cystitis, Pancreatic cyst, and Squamous cell carcinoma of skin (08/14/2016).  has a past surgical history that includes Back surgery (2012); Neck surgery (2000); Cataract extraction, bilateral; Tonsillectomy and adenoidectomy; Anterior lat lumbar fusion (Right, 07/30/2016); EUS (N/A, 07/10/2017); Tennis elbow release/nirschel procedure; Eye surgery; Cholecystectomy (N/A, 07/25/2017); Colonoscopy with propofol (N/A, 12/12/2017); Hemi-microdiscectomy lumbar laminectomy level 1 (09/2018); LEFT HEART CATH AND CORONARY ANGIOGRAPHY (N/A, 09/04/2022); Coronary artery bypass graft (N/A, 09/07/2022); TEE without cardioversion (N/A, 09/07/2022); LEFT HEART CATH AND CORONARY ANGIOGRAPHY (N/A, 09/06/2022); and IABP Insertion (N/A, 09/06/2022). Patient denies hx of stroke, seizures, lung problems, diabetes, unexplained weight loss, unexplained changes in bowel or bladder problems, unexplained stumbling or dropping things, and osteoporosis  SUBJECTIVE STATEMENT: Patient arrives to session feeling more stiff/sore today after washing his car yesterday. Patient reports feeling the most soreness in his left low back, hip, and glute region. Patient states his CBC came back fine, he has an echocardiogram scheduled for Wednesday with the cardiologist, and has a follow-up with his pain doctor in 2 weeks. He reports his HEP has been going  well and exercises all feel good.   PAIN: Are you having pain? Yes NPRS: Current: 6/10 in standing, 0/10 while sitting  Pain location: left glute, hip, and low back     LEISURE: doing stuff around the house, mowing the yard, cleaning cars up  PRECAUTIONS: None  PATIENT GOALS: "would like the pain to go away in my left hip"  NEXT MD VISIT: no appointment scheduled currently  OBJECTIVE  TREATMENT                                                                                                                            Therapeutic exercise: to centralize symptoms and improve ROM, strength, muscular endurance, and activity tolerance required for successful completion of functional activities.   Bridges with staggered stance  2x20 each leg     Therapeutic activities: dynamic activities for functional strengthening and improved functional activity tolerance.  TENS estim applied to bilateral low back and glutes throughout therapeutic activities.    Goblet Squat with 15# DB  3x10  1 min rest after each set     Pallof Press    1x10 each side with 5#        Deadlifts with 23# Bar to two 17 inch    2x10    1 min rest between sets      Modality: for pain control and muscle relaxation.   TENS estim applied to low back with four 2x2 inch square pads while completing therapeutic activities listed above. Two channels (intensity to level 4 and 4.5 on 2 channels). Applied to bilateral low back and glutes. Channel 2 square pad location adjusted during session to lower glute region and intensity set to level 3.5.  Mode: modulated, Pulse width: 120 nanoS, Pulse Rate: 110 hz, continuous.  Pt response: decreased back pain during application of estim. skin within normal limits to visual inspection before and after.   Pt required multimodal cuing for proper technique and to facilitate improved neuromuscular control, strength, range of motion, and functional ability resulting in improved  performance and form.  Self-Care/Home Management Training:    Education on TENS Unit for home use and provided handout for unit instructions and settings     PATIENT EDUCATION:  Education details:  Exercise purpose/form. Self management techniques. Use of TENS unit. Person educated: Patient Education method: Explanation, Demonstration, Tactile cues, Verbal cues, and Handouts Education comprehension: verbalized understanding, returned demonstration, and needs further education  HOME EXERCISE PROGRAM: Access Code: [email or web address] URL: [email or web address] Date: 04/07/2023 Prepared by: Norton Blizzard  Exercises - Seated Slump Nerve Glide  - 2 x daily - 1 sets - 20 reps - Supine Piriformis Stretch with Leg Straight  - 2 x daily - 3 sets - 1 minute hold - Goblet Squat with Kettlebell  - 3-4 x weekly - 3 sets - 20 reps - Supine Lower Trunk Rotation with Swiss Ball  - 2 x daily - 1 sets - 20 reps - Hamstring Curl with Weight Machine  - 3-4 x weekly - 3 sets - 20 reps - Supine Hamstring Stretch with Strap  - 2 x daily - 1 sets - 10 reps - 5 seconds hold - Seated Shoulder Row with Anchored Resistance  - 3 x weekly - 2 sets - 20 reps - Standing Anti-Rotation Press with Anchored Resistance  - 3 x weekly - 2 sets - 10 reps - Staggered Bridge  - 3 x weekly - 2 sets - 10 reps  Patient Education - TENS Therapy - TENS Unit   HOME EXERCISE PROGRAM [[email or web address]]  Self-Mobilization to Glutes with [employer]  -  Hold 5 Minutes max, Perform 1-2 Times a Day  ASSESSMENT:  CLINICAL IMPRESSION: Patient arrives to session reporting increased pain and soreness due to washing his car yesterday. The focus of today's session was to continue functional strengthening/endurance of the lower extremities, abdominals, and postural muscles. TENS estim modality applied to bilateral low back and glute region during therapeutic activities to assess any changes in pain/tolerance with the estim. Patient  describes increased tolerance to exercises and reduction in pain with the estim. Patient tolerated interventions well and was able to complete all exercises with improvement in symptoms. Patient provided education on at home TENS unit use including handout of unit instructions and settings utilized in the clinic. Patient reports feeling better and "more limber" at end of session with improved pain to 1/10 in standing and 0/10 in sitting. Patient would benefit from continued management of limiting condition by skilled physical therapist to address remaining impairments and functional limitations to work towards stated goals and return to PLOF or maximal functional independence.   From Initial PT evaluation on 03/03/2024: Patient is a 78 y.o. male referred to outpatient physical therapy with a medical diagnosis of other spondylosis with radiculopathy, lumbosacral region who presents with signs and symptoms consistent with chronic low back pain with referral to left gluteal notch with positive bilateral SLR suggesting nerve root irritation as a contributing factor. He presents with pattern consistent with symptomatic lumbar stenosis.  Patient presents with significant pain, posture, ROM, joint stiffness, neurodynamic, muscle tension, muscle performance (strength/power/endurance) and activity tolerance impairments that are limiting ability to complete anything that requires standing or walking, using a push mower, cleaning up cars, going to the grocery store/shopping, walking >  5 min, standing > 3 min without difficulty. Patient will benefit from skilled physical therapy intervention to address current body structure impairments and activity limitations to improve function and work towards goals set in current POC in order to return to prior level of function or maximal functional improvement.   OBJECTIVE IMPAIRMENTS: Abnormal gait, decreased activity tolerance, decreased balance, decreased endurance, decreased  knowledge of condition, decreased knowledge of use of DME, decreased mobility, difficulty walking, decreased ROM, decreased strength, hypomobility, impaired perceived functional ability, increased muscle spasms, impaired flexibility, improper body mechanics, postural dysfunction, and pain.   ACTIVITY LIMITATIONS: carrying, lifting, standing, sleeping, stairs, reach over head, locomotion level, and caring for others  PARTICIPATION LIMITATIONS: meal prep, cleaning, laundry, interpersonal relationship, shopping, community activity, yard work, and   difficulty anything that requires standing or walking, using a push mower, cleaning up cars, going to the grocery store/shopping, walking > 5 min, standing > 3 min  PERSONAL FACTORS: Age, Past/current experiences, Time since onset of injury/illness/exacerbation, and 3+ comorbidities:   Benign prostatic hyperplasia with urinary obstruction; Prediabetes; Interstitial cystitis; Pancreatic cyst; Impingement syndrome of shoulder region; Abdominal mass; Hypertriglyceridemia; History of colon polyps; History of spinal fusion; Chronic kidney disease, stage 3b (HCC); NSTEMI (non-ST elevated myocardial infarction) (HCC); Primary hypertension; Coronary artery disease; s/p cabg x 5; and Hyperlipidemia, mixed on their problem list. Has PMH of Abdominal pain (06/02/2017), Anxiety, Arthritis, Basal cell carcinoma (01/21/2017), BPH with obstruction/lower urinary tract symptoms,Choledocholithiasis with acute cholecystitis, History of actinic keratoses, Interstitial cystitis, Pancreatic cyst, and Squamous cell carcinoma of skin (08/14/2016).  has a past surgical history that includes Back surgery (2012); Neck surgery (2000); Cataract extraction, bilateral; Tonsillectomy and adenoidectomy; Anterior lat lumbar fusion (Right, 07/30/2016); EUS (N/A, 07/10/2017); Tennis elbow release/nirschel procedure; Eye surgery; Cholecystectomy (N/A, 07/25/2017); Colonoscopy with propofol (N/A, 12/12/2017);  Hemi-microdiscectomy lumbar laminectomy level 1 (09/2018); LEFT HEART CATH AND CORONARY ANGIOGRAPHY (N/A, 09/04/2022); Coronary artery bypass graft (N/A, 09/07/2022); TEE without cardioversion (N/A, 09/07/2022); LEFT HEART CATH AND CORONARY ANGIOGRAPHY (N/A, 09/06/2022); and IABP Insertion (N/A, 09/06/2022) are also affecting patient's functional outcome.   REHAB POTENTIAL: Fair due to nature and severity of condition and previously attempted intervention without success.   CLINICAL DECISION MAKING: Evolving/moderate complexity  EVALUATION COMPLEXITY: Moderate   GOALS: Goals reviewed with patient? No  SHORT TERM GOALS: Target date: 03/18/2023  Patient will be independent with initial home exercise program for self-management of symptoms. Baseline: Initial HEP to be provided at visit 2 as appropriate (03/04/23); initial HEP provided at visit #2 (03/17/2023);  Goal status: In progress   LONG TERM GOALS: Target date: 05/27/2023  Patient will be independent with a long-term home exercise program for self-management of symptoms.  Baseline: Initial HEP to be provided at visit 2 as appropriate (03/04/23); Initial HEP to be provided at visit 2 as appropriate (03/04/23); initial HEP provided at visit #2 (03/17/2023);  Goal status: In progress  2.  Patient will demonstrate improved FOTO to equal or greater than 47 by visit #14 to demonstrate improvement in overall condition and self-reported functional ability.  Baseline: 33 (03/04/23); Goal status: In progress  3.  Patient will ambulate 1729 feet during 6 Minute Walk Test which is his age matched norm to demonstrate improved ability to walk community distances and go shopping.  Baseline: to be tested visit 2 as appropriate (03/04/23); 1152 feet with no AD, up to 8/10 pain in left glute and developed antalgic gait by end of set (03/17/2023);  Goal status: In progress  4.  Patient will report left glute pain of equal or less than 3/10 with functional  activities to improve his ability to perform activities that require standing and walking.  Baseline: up to 8/10 (03/04/23); Goal status: In progress  5.  Patient will demonstrate improvement in Patient Specific Functional Scale (PSFS) of equal or greater than 3 points to reflect clinically significant improvement in patient's most valued functional activities. Baseline: to be measured visit 2 as appropriate (03/04/23); 2/10 (03/17/2023) Goal status: In progress   PLAN:  PT FREQUENCY: 1-2x/week  PT DURATION: 8-12 weeks  PLANNED INTERVENTIONS: 97164- PT Re-evaluation, 97110-Therapeutic exercises, 97530- Therapeutic activity, [id number]- Neuromuscular re-education, 97535- Self Care, 16109- Manual therapy, [id number]- Gait training, [phone]- Aquatic Therapy, 97014- Electrical stimulation (unattended), [phone]- Electrical stimulation (manual), Patient/Family education, Balance training, Dry Needling, Joint mobilization, Spinal mobilization, DME instructions, Cryotherapy, and Moist heat.  PLAN FOR NEXT SESSION: Update HEP as appropriate, dry needling to left glute region as appropriate and per patient/MD preference, progressive trunk/LE/functional strengthening and ROM exercises as tolerated. Manual therapy as needed. Education.   Tamarion Haymond [place], SPT [employer] DPTE  Huntley Dec R. Ilsa Iha, PT, DPT 04/07/23, 3:12 PM  [facility] [address] [place], [place] [address] P: [phone] I F: [phone]

## 2023-04-08 DIAGNOSIS — Z860101 Personal history of adenomatous and serrated colon polyps: Secondary | ICD-10-CM | POA: Diagnosis not present

## 2023-04-08 DIAGNOSIS — K625 Hemorrhage of anus and rectum: Secondary | ICD-10-CM | POA: Diagnosis not present

## 2023-04-09 ENCOUNTER — Encounter: Payer: Self-pay | Admitting: Physical Therapy

## 2023-04-09 ENCOUNTER — Ambulatory Visit: Payer: HMO | Attending: Physician Assistant

## 2023-04-09 ENCOUNTER — Ambulatory Visit: Payer: HMO | Admitting: Physical Therapy

## 2023-04-09 DIAGNOSIS — R0609 Other forms of dyspnea: Secondary | ICD-10-CM

## 2023-04-09 DIAGNOSIS — M5459 Other low back pain: Secondary | ICD-10-CM

## 2023-04-09 DIAGNOSIS — R262 Difficulty in walking, not elsewhere classified: Secondary | ICD-10-CM

## 2023-04-09 DIAGNOSIS — M792 Neuralgia and neuritis, unspecified: Secondary | ICD-10-CM

## 2023-04-09 LAB — ECHOCARDIOGRAM COMPLETE
AR max vel: 2.85 cm2
AV Area VTI: 2.89 cm2
AV Area mean vel: 2.6 cm2
AV Mean grad: 3 mmHg
AV Peak grad: 4.8 mmHg
Ao pk vel: 1.09 m/s
Area-P 1/2: 3.65 cm2
Calc EF: 43.3 %
S' Lateral: 3.2 cm
Single Plane A2C EF: 41.9 %
Single Plane A4C EF: 46.3 %

## 2023-04-09 NOTE — Therapy (Signed)
 OUTPATIENT PHYSICAL THERAPY TREATMENT   Patient Name: Rodney Hall. MRN: [id number] DOB:March 14, 1945, 78 y.o., male Today's Date: 04/09/2023  END OF SESSION:  PT End of Session - 04/09/23 1427     Visit Number 9    Number of Visits 13    Date for PT Re-Evaluation 05/27/23    Authorization Type [employer] reporting period from 03/04/2023    PT Start Time 1030    PT Stop Time 1118    PT Time Calculation (min) 48 min    Activity Tolerance Patient tolerated treatment well    Behavior During Therapy [facility] for tasks assessed/performed               Past Medical History:  Diagnosis Date   Abdominal mass    Abdominal pain 06/02/2017   Anxiety    Pt. denies   Arthritis    pt. 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   Past Surgical History:  Procedure Laterality Date   ANTERIOR LAT LUMBAR FUSION Right 07/30/2016   Procedure: Right Lateral two-three Lateral transpsoas interbody fusion with lateral plating/Minimally invasive decompression at Lumbar two-three;  Surgeon: Ditty, Morene Hicks, MD;  Location: [facility] OR;  Service: Neurosurgery;  Laterality: Right;   BACK SURGERY  2012   CATARACT EXTRACTION, BILATERAL     CHOLECYSTECTOMY N/A 07/25/2017   Procedure: LAPAROSCOPIC CHOLECYSTECTOMY WITH INTRAOPERATIVE CHOLANGIOGRAM;  Surgeon: Gladis Cough, MD;  Location: [facility] ORS;  Service: General;  Laterality: N/A;   COLONOSCOPY WITH PROPOFOL  N/A 12/12/2017   Procedure: COLONOSCOPY WITH PROPOFOL ;  Surgeon: Viktoria Lamar DASEN, MD;  Location: [facility] ENDOSCOPY;  Service: Endoscopy;  Laterality: N/A;   CORONARY ARTERY BYPASS GRAFT N/A  09/07/2022   Procedure: CORONARY ARTERY BYPASS GRAFTING (CABG) TIMES FIVE, USING LEFT INTERNAL MAMMARY ARTERY AND RIGHT GREATER SAPHENOUS VEIN HARVESTED ENDOSCOPICALLY;  Surgeon: Lucas Dorise POUR, MD;  Location: [facility] OR;  Service: Open Heart Surgery;  Laterality: N/A;   EUS N/A 07/10/2017   Procedure: UPPER ENDOSCOPIC ULTRASOUND (EUS) LINEAR;  Surgeon: Teressa Toribio SQUIBB, MD;  Location: [facility] ENDOSCOPY;  Service: Endoscopy;  Laterality: N/A;   EYE SURGERY     HEMI-MICRODISCECTOMY LUMBAR LAMINECTOMY LEVEL 1  09/2018   [facility]   IABP INSERTION N/A 09/06/2022   Procedure: IABP Insertion;  Surgeon: Maryfrances Portugal, Peter M, MD;  Location: [facility] INVASIVE CV LAB;  Service: Cardiovascular;  Laterality: N/A;   LEFT HEART CATH AND CORONARY ANGIOGRAPHY N/A 09/04/2022   Procedure: LEFT HEART CATH AND CORONARY ANGIOGRAPHY;  Surgeon: Mady Bruckner, MD;  Location: [facility] INVASIVE CV LAB;  Service: Cardiovascular;  Laterality: N/A;   LEFT HEART CATH AND CORONARY ANGIOGRAPHY N/A 09/06/2022   Procedure: LEFT HEART CATH AND CORONARY ANGIOGRAPHY;  Surgeon: Tjay Velazquez, Peter M, MD;  Location: [facility] INVASIVE CV LAB;  Service: Cardiovascular;  Laterality: N/A;   NECK SURGERY  2000   TEE WITHOUT CARDIOVERSION N/A 09/07/2022   Procedure: TRANSESOPHAGEAL ECHOCARDIOGRAM;  Surgeon: Lucas Dorise POUR, MD;  Location: [facility] OR;  Service: Open Heart Surgery;  Laterality: N/A;   TENNIS ELBOW RELEASE/NIRSCHEL PROCEDURE     TONSILLECTOMY AND ADENOIDECTOMY  Patient Active Problem List   Diagnosis Date Noted   Abnormal CT scan, lumbar spine (06/17/2016) 04/02/2023   Chronic anticoagulation (Plavix ) 04/02/2023   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   Chronic low back pain (1ry area of Pain) (Bilateral) w/o sciatica 04/02/2023   Lumbar post-laminectomy syndrome 12/19/2022   Hyperlipidemia, mixed 11/07/2022   s/p cabg x 5 09/10/2022   Coronary artery disease 09/04/2022   NSTEMI (non-ST elevated myocardial infarction) (HCC) 09/03/2022   Primary  hypertension 09/03/2022   Abnormal urine 04/17/2022   Cyst of kidney, acquired 04/17/2022   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   Chronic kidney disease, stage 3b (HCC) 10/11/2021   History of spinal fusion 03/13/2020   Hypertriglyceridemia 01/11/2019   History of colon polyps 01/11/2019   Abdominal mass    Impingement syndrome of shoulder region 06/30/2017   Pancreatic cyst    Low back pain 06/05/2016   Lumbosacral spondylosis with radiculopathy 06/05/2016   Benign prostatic hyperplasia with urinary obstruction 04/06/2015   Interstitial cystitis 04/06/2015    PCP: Gasper Nancyann BRAVO, MD  REFERRING PROVIDER: Joshua Alm Hamilton, MD (neurosurgery)  REFERRING DIAG: other spondylosis with radiculopathy, lumbosacral region  Rationale for Evaluation and Treatment: Rehabilitation  THERAPY DIAG:  Other low back pain  Difficulty in walking, not elsewhere classified  Neuralgia and neuritis  ONSET DATE: chronic with history of multiple surgeries over the years (at least 10 years ago), but recently has had more pain in the last 3 months prior to PT evaluation.   SUBJECTIVE:                                                                                                                                                                                           PERTINENT HISTORY:  Patient is a 78 y.o. male who presents to outpatient physical therapy with a referral for medical diagnosis other spondylosis with radiculopathy, lumbosacral region. This patient's chief complaints consist of worsening of chronic low back and left glute pain leading to the following functional deficits: difficulty anything that requires standing or walking, using a push mower, cleaning up cars, going to the grocery store/shopping, walking > 5 min, standing > 3 min. Relevant past medical history and comorbidities include  Benign prostatic hyperplasia with urinary obstruction;  Prediabetes; Interstitial cystitis; Pancreatic cyst; Impingement syndrome of shoulder region; Abdominal mass; Hypertriglyceridemia; History of colon polyps; History of spinal fusion; Chronic kidney disease, stage 3b (HCC); NSTEMI (non-ST elevated myocardial infarction) (HCC); Primary hypertension; Coronary artery disease; s/p cabg x 5; and Hyperlipidemia, mixed on their problem list. Has PMH of Abdominal pain (06/02/2017), Anxiety, Arthritis,  Basal cell carcinoma (01/21/2017), BPH with obstruction/lower urinary tract symptoms,Choledocholithiasis with acute cholecystitis, History of actinic keratoses, Interstitial cystitis, Pancreatic cyst, and Squamous cell carcinoma of skin (08/14/2016).  has a past surgical history that includes Back surgery (2012); Neck surgery (2000); Cataract extraction, bilateral; Tonsillectomy and adenoidectomy; Anterior lat lumbar fusion (Right, 07/30/2016); EUS (N/A, 07/10/2017); Tennis elbow release/nirschel procedure; Eye surgery; Cholecystectomy (N/A, 07/25/2017); Colonoscopy with propofol  (N/A, 12/12/2017); Hemi-microdiscectomy lumbar laminectomy level 1 (09/2018); LEFT HEART CATH AND CORONARY ANGIOGRAPHY (N/A, 09/04/2022); Coronary artery bypass graft (N/A, 09/07/2022); TEE without cardioversion (N/A, 09/07/2022); LEFT HEART CATH AND CORONARY ANGIOGRAPHY (N/A, 09/06/2022); and IABP Insertion (N/A, 09/06/2022). Patient denies hx of stroke, seizures, lung problems, diabetes, unexplained weight loss, unexplained changes in bowel or bladder problems, unexplained stumbling or dropping things, and osteoporosis  SUBJECTIVE STATEMENT: Patient arrives to session hurting more today for unknown reason and wants to get dry needling done today because he feels that is the only thing that seems to help (for 2-3 days). Patient reports feeling soreness in his left low back and nerve pain in his left glute region. He reports his pain was 8/10 this morning while standing and walking but it has calmed down with a  lot of sitting this morning (currently 1/10 in standing and sitting). He states his pain was so significant this morning that he had to use his cane to get to the truck which he hasn't ever had to do before. He states he got his TENS unit for at home and has used it a couple times since last session and is still figuring it out and determining which setting/estim pad placement feels the best. Patient has echocardiogram with cardiologist this afternoon and follow up with pain doctor in 2 weeks.  PAIN: Are you having pain? Yes NPRS: Current: 1/10 in standing, 1/10 while sitting  Pain location: left glute, hip, and low back     LEISURE: doing stuff around the house, mowing the yard, cleaning cars up  PRECAUTIONS: None  PATIENT GOALS: would like the pain to go away in my left hip  NEXT MD VISIT: no appointment scheduled currently  OBJECTIVE  TREATMENT                                                                                                                            Therapeutic exercise: to centralize symptoms and improve ROM, strength, muscular endurance, and activity tolerance required for successful completion of functional activities.     Hooklying single knee to opposite shoulder    3x45 seconds   Bridges with staggered stance  2x20 each leg     Therapeutic activities: dynamic activities for functional strengthening and improved functional activity tolerance.  TENS estim applied to bilateral low back and glutes throughout therapeutic activities.    Goblet Squat to 17 inch chair with airex pad   3x10 with 15# DB  30 sec-1 min rest between sets    Pallof Press    2x10  each side with 10#   1 min seated rest following second set         Deadlifts with 23# Bar to two 17 inch chairs    3x10    1 min rest between sets      Modality: for pain control and muscle relaxation.   TENS estim applied to low back with four 2x2 inch square pads while completing therapeutic  activities listed above. Two channels (intensity to level 3 and 3.5 on 2 channels). Applied to bilateral low back and glutes.   Mode: modulated, Pulse width: 120 nanoS, Pulse Rate: 110 hz, continuous.   Pt response: decreased back pain during application of estim. skin within normal limits to visual inspection before and after.   Pt required multimodal cuing for proper technique and to facilitate improved neuromuscular control, strength, range of motion, and functional ability resulting in improved performance and form.   Dry Needling:   Trigger Point Dry Needling (3 min unbilled)   Subsequent Treatment: Instructions provided previously at initial dry needling treatment.   Instructions reviewed, if requested by the patient, prior to subsequent dry needling treatment.   Patient Verbal Consent Given: Yes   Education Handout Provided: Previously Provided   Muscles Treated: 1 dry needle(s) .30mm x inserted with 1 sticks into left proximal piriformis muscle with patient in prone position to decrease pain and spasms along patient's left glute region.   Electrical Stimulation Performed: No   Treatment Response/Outcome: Patient had twitch response and reported expected ache in same location to his concordant pain.   No adverse or unexpected response.   Dry needling performed exclusively by Camie SAUNDERS. Juli, PT, DPT who is certified in this technique.    PATIENT EDUCATION:  Education details:  Exercise purpose/form. Self management techniques. Use of TENS unit. Person educated: Patient Education method: Explanation, Demonstration, Tactile cues, Verbal cues, and Handouts Education comprehension: verbalized understanding, returned demonstration, and needs further education  HOME EXERCISE PROGRAM: Access Code: [email or web address] URL: [email or web address] Date: 04/07/2023 Prepared by: Camie Juli  Exercises - Seated Slump Nerve Glide  - 2 x daily - 1 sets - 20 reps - Supine  Piriformis Stretch with Leg Straight  - 2 x daily - 3 sets - 1 minute hold - Goblet Squat with Kettlebell  - 3-4 x weekly - 3 sets - 20 reps - Supine Lower Trunk Rotation with Swiss Ball  - 2 x daily - 1 sets - 20 reps - Hamstring Curl with Weight Machine  - 3-4 x weekly - 3 sets - 20 reps - Supine Hamstring Stretch with Strap  - 2 x daily - 1 sets - 10 reps - 5 seconds hold - Seated Shoulder Row with Anchored Resistance  - 3 x weekly - 2 sets - 20 reps - Standing Anti-Rotation Press with Anchored Resistance  - 3 x weekly - 2 sets - 10 reps - Staggered Bridge  - 3 x weekly - 2 sets - 10 reps  Patient Education - TENS Therapy - TENS Unit   HOME EXERCISE PROGRAM [[email or web address]]  Self-Mobilization to Glutes with [employer]  -  Hold 5 Minutes max, Perform 1-2 Times a Day  ASSESSMENT:  CLINICAL IMPRESSION: Patient arrives to session reporting he has been hurting today with increased left low back pain and nerve pain in left glute. The focus of today's session was to continue progressing functional strengthening/endurance of the lower extremities, abdominals, and postural muscles. Capillary refill test on fingers assessed due to blue hue  under finger nails and paleness in fingertips. Capillary refill was slow but present and patient reported no shortness of breath or dizziness. TENS estim modality utilized during therapeutic activities to bilateral low back and glute region improve tolerance during exercises. Patient describes continued improved tolerance to exercises and reduction in pain with estim. Patient tolerated interventions well and was able to complete all exercises with improvement in symptoms. Patient benefits from counting his repetitions out loud and reminds him not to hold his breath while exercising. Patient underwent dry needling to the left proximal piriformis muscle with patient to decrease pain and spasms along patient's left glute region. Patient reports feeling much better and much  more limber at end of session. Patient would benefit from continued management of limiting condition by skilled physical therapist to address remaining impairments and functional limitations to work towards stated goals and return to PLOF or maximal functional independence.   From Initial PT evaluation on 03/03/2024: Patient is a 78 y.o. male referred to outpatient physical therapy with a medical diagnosis of other spondylosis with radiculopathy, lumbosacral region who presents with signs and symptoms consistent with chronic low back pain with referral to left gluteal notch with positive bilateral SLR suggesting nerve root irritation as a contributing factor. He presents with pattern consistent with symptomatic lumbar stenosis.  Patient presents with significant pain, posture, ROM, joint stiffness, neurodynamic, muscle tension, muscle performance (strength/power/endurance) and activity tolerance impairments that are limiting ability to complete anything that requires standing or walking, using a push mower, cleaning up cars, going to the grocery store/shopping, walking > 5 min, standing > 3 min without difficulty. Patient will benefit from skilled physical therapy intervention to address current body structure impairments and activity limitations to improve function and work towards goals set in current POC in order to return to prior level of function or maximal functional improvement.   OBJECTIVE IMPAIRMENTS: Abnormal gait, decreased activity tolerance, decreased balance, decreased endurance, decreased knowledge of condition, decreased knowledge of use of DME, decreased mobility, difficulty walking, decreased ROM, decreased strength, hypomobility, impaired perceived functional ability, increased muscle spasms, impaired flexibility, improper body mechanics, postural dysfunction, and pain.   ACTIVITY LIMITATIONS: carrying, lifting, standing, sleeping, stairs, reach over head, locomotion level, and caring  for others  PARTICIPATION LIMITATIONS: meal prep, cleaning, laundry, interpersonal relationship, shopping, community activity, yard work, and   difficulty anything that requires standing or walking, using a push mower, cleaning up cars, going to the grocery store/shopping, walking > 5 min, standing > 3 min  PERSONAL FACTORS: Age, Past/current experiences, Time since onset of injury/illness/exacerbation, and 3+ comorbidities:   Benign prostatic hyperplasia with urinary obstruction; Prediabetes; Interstitial cystitis; Pancreatic cyst; Impingement syndrome of shoulder region; Abdominal mass; Hypertriglyceridemia; History of colon polyps; History of spinal fusion; Chronic kidney disease, stage 3b (HCC); NSTEMI (non-ST elevated myocardial infarction) (HCC); Primary hypertension; Coronary artery disease; s/p cabg x 5; and Hyperlipidemia, mixed on their problem list. Has PMH of Abdominal pain (06/02/2017), Anxiety, Arthritis, Basal cell carcinoma (01/21/2017), BPH with obstruction/lower urinary tract symptoms,Choledocholithiasis with acute cholecystitis, History of actinic keratoses, Interstitial cystitis, Pancreatic cyst, and Squamous cell carcinoma of skin (08/14/2016).  has a past surgical history that includes Back surgery (2012); Neck surgery (2000); Cataract extraction, bilateral; Tonsillectomy and adenoidectomy; Anterior lat lumbar fusion (Right, 07/30/2016); EUS (N/A, 07/10/2017); Tennis elbow release/nirschel procedure; Eye surgery; Cholecystectomy (N/A, 07/25/2017); Colonoscopy with propofol  (N/A, 12/12/2017); Hemi-microdiscectomy lumbar laminectomy level 1 (09/2018); LEFT HEART CATH AND CORONARY ANGIOGRAPHY (N/A, 09/04/2022); Coronary artery bypass graft (N/A, 09/07/2022);  TEE without cardioversion (N/A, 09/07/2022); LEFT HEART CATH AND CORONARY ANGIOGRAPHY (N/A, 09/06/2022); and IABP Insertion (N/A, 09/06/2022) are also affecting patient's functional outcome.   REHAB POTENTIAL: Fair due to nature and severity of  condition and previously attempted intervention without success.   CLINICAL DECISION MAKING: Evolving/moderate complexity  EVALUATION COMPLEXITY: Moderate   GOALS: Goals reviewed with patient? No  SHORT TERM GOALS: Target date: 03/18/2023  Patient will be independent with initial home exercise program for self-management of symptoms. Baseline: Initial HEP to be provided at visit 2 as appropriate (03/04/23); initial HEP provided at visit #2 (03/17/2023);  Goal status: In progress   LONG TERM GOALS: Target date: 05/27/2023  Patient will be independent with a long-term home exercise program for self-management of symptoms.  Baseline: Initial HEP to be provided at visit 2 as appropriate (03/04/23); Initial HEP to be provided at visit 2 as appropriate (03/04/23); initial HEP provided at visit #2 (03/17/2023);  Goal status: In progress  2.  Patient will demonstrate improved FOTO to equal or greater than 47 by visit #14 to demonstrate improvement in overall condition and self-reported functional ability.  Baseline: 33 (03/04/23); Goal status: In progress  3.  Patient will ambulate 1729 feet during 6 Minute Walk Test which is his age matched norm to demonstrate improved ability to walk community distances and go shopping.  Baseline: to be tested visit 2 as appropriate (03/04/23); 1152 feet with no AD, up to 8/10 pain in left glute and developed antalgic gait by end of set (03/17/2023);  Goal status: In progress  4.  Patient will report left glute pain of equal or less than 3/10 with functional activities to improve his ability to perform activities that require standing and walking.  Baseline: up to 8/10 (03/04/23); Goal status: In progress  5.  Patient will demonstrate improvement in Patient Specific Functional Scale (PSFS) of equal or greater than 3 points to reflect clinically significant improvement in patient's most valued functional activities. Baseline: to be measured visit 2 as  appropriate (03/04/23); 2/10 (03/17/2023) Goal status: In progress   PLAN:  PT FREQUENCY: 1-2x/week  PT DURATION: 8-12 weeks  PLANNED INTERVENTIONS: 97164- PT Re-evaluation, 97110-Therapeutic exercises, 97530- Therapeutic activity, [id number]- Neuromuscular re-education, 97535- Self Care, [id number]- Manual therapy, [id number]- Gait training, [phone]- Aquatic Therapy, 97014- Electrical stimulation (unattended), [phone]- Electrical stimulation (manual), Patient/Family education, Balance training, Dry Needling, Joint mobilization, Spinal mobilization, DME instructions, Cryotherapy, and Moist heat.  PLAN FOR NEXT SESSION: Update HEP as appropriate, dry needling to left glute region as appropriate and per patient/MD preference, progressive trunk/LE/functional strengthening and ROM exercises as tolerated. Manual therapy as needed. Education.   Tuleen Mandelbaum, SPT [employer] DPTE  Camie R. Juli, PT, DPT 04/09/23, 5:01 PM  [facility] [address] [place], [place] [address] P: [phone] I F: [phone]

## 2023-04-14 ENCOUNTER — Encounter: Payer: Self-pay | Admitting: Physical Therapy

## 2023-04-14 ENCOUNTER — Ambulatory Visit (INDEPENDENT_AMBULATORY_CARE_PROVIDER_SITE_OTHER): Payer: HMO | Admitting: Family Medicine

## 2023-04-14 ENCOUNTER — Ambulatory Visit: Payer: HMO | Admitting: Physical Therapy

## 2023-04-14 VITALS — BP 123/76 | HR 82 | Temp 96.4°F | Ht 70.0 in | Wt 196.0 lb

## 2023-04-14 DIAGNOSIS — M5459 Other low back pain: Secondary | ICD-10-CM

## 2023-04-14 DIAGNOSIS — R7303 Prediabetes: Secondary | ICD-10-CM | POA: Diagnosis not present

## 2023-04-14 DIAGNOSIS — R262 Difficulty in walking, not elsewhere classified: Secondary | ICD-10-CM

## 2023-04-14 DIAGNOSIS — M792 Neuralgia and neuritis, unspecified: Secondary | ICD-10-CM

## 2023-04-14 LAB — POCT GLYCOSYLATED HEMOGLOBIN (HGB A1C): Hemoglobin A1C: 6 % — AB (ref 4.0–5.6)

## 2023-04-14 MED ORDER — OXYCODONE-ACETAMINOPHEN 5-325 MG PO TABS: 1.0000 | ORAL_TABLET | ORAL | 0 refills | Status: DC | PRN

## 2023-04-14 MED ORDER — IMIPRAMINE HCL 50 MG PO TABS: 50.0000 mg | ORAL_TABLET | Freq: Every day | ORAL | 0 refills | Status: DC

## 2023-04-14 MED ORDER — BUSPIRONE HCL 7.5 MG PO TABS: ORAL_TABLET | ORAL | 2 refills | Status: DC

## 2023-04-14 NOTE — Progress Notes (Signed)
 Established patient visit   Patient: Rodney Hall.   DOB: Dec 21, 1945   78 y.o. Male  MRN: [id number] Visit Date: 04/14/2023  Today's healthcare provider: Jeralene Mom, MD   Chief Complaint  Patient presents with   Hyperglycemia   Subjective    Discussed the use of AI scribe software for clinical note transcription with the patient, who gave verbal consent to proceed.  History of Present Illness   Rodney Silvas. is a 78 year old male with pre=diabetes and a history of myocardial infarction who presents for a follow-up.   His primary concern today is worsening back pain. He has consulted two surgeons and visited two pain clinics, but his condition has deteriorated. He describes severe pain that intensifies by the time he reaches his truck, significantly limiting his ability to function at home. He has been attending physical therapy for six weeks, which provides temporary relief but no lasting improvement. He recalls taking oxycodone  years ago which was effective. He also reports anxiety, which he attributes to the chronic pain. He has a history of anxiety and recalls taking a small pill prescribed by a doctor years ago, which was effective. He has reduced his workload in the past to manage anxiety better.  He requests a refill for Imipramine  50 mg as he accidentally disposed bottle that had 30 days left.       Medications: Outpatient Medications Prior to Visit  Medication Sig   acetaminophen  (TYLENOL ) 325 MG tablet Take 650 mg by mouth every 6 (six) hours as needed for moderate pain or headache.   aspirin  EC 81 MG tablet Take 1 tablet (81 mg total) by mouth daily. Swallow whole.   clopidogrel  (PLAVIX ) 75 MG tablet Take 1 tablet (75 mg total) by mouth daily.   imipramine  (TOFRANIL ) 50 MG tablet Take 1 tablet (50 mg total) by mouth at bedtime.   omeprazole  (PRILOSEC) 20 MG capsule Take 20 mg by mouth 2 (two) times daily.   tamsulosin  (FLOMAX ) 0.4 MG CAPS capsule Take 1  capsule (0.4 mg total) by mouth daily after supper.   atorvastatin  (LIPITOR) 10 MG tablet Take 1 tablet (10 mg total) by mouth daily.   pantoprazole  (PROTONIX ) 40 MG tablet Take 1 tablet (40 mg total) by mouth daily for 14 days. (Patient taking differently: Take 40 mg by mouth daily. PRN)   No facility-administered medications prior to visit.   Review of Systems  Constitutional:  Negative for appetite change, chills and fever.  Respiratory:  Negative for chest tightness, shortness of breath and wheezing.   Cardiovascular:  Negative for chest pain and palpitations.  Gastrointestinal:  Negative for abdominal pain, nausea and vomiting.      Objective    BP 123/76 (BP Location: Left Arm, Patient Position: Sitting, Cuff Size: Normal)   Pulse 82   Temp (!) 96.4 F (35.8 C) (Oral)   Ht 5\' 10"  (1.778 m)   Wt 196 lb (88.9 kg)   SpO2 100%   BMI 28.12 kg/m   Physical Exam  General appearance: Well developed, well nourished male, cooperative and in no acute distress Head: Normocephalic, without obvious abnormality, atraumatic Respiratory: Respirations even and unlabored, normal respiratory rate Extremities: All extremities are intact.  Skin: Skin color, texture, turgor normal. No rashes seen  Psych: Appropriate mood and affect. Neurologic: Mental status: Alert, oriented to person, place, and time, thought content appropriate.     Results for orders placed or performed in visit on 04/14/23  POCT glycosylated hemoglobin (Hb A1C)  Result Value Ref Range   Hemoglobin A1C 6.0 (A) 4.0 - 5.6 %    Assessment & Plan        Chronic Back Pain Worsening pain despite physical therapy and pain clinic visits. Pain is limiting mobility and causing anxiety. -Start Oxycodone -Acetaminophen  for pain control. -Continue with pain clinic visits and physical therapy. -Return in one month for reassessment.  Anxiety Increased anxiety likely secondary to chronic pain. -Start Buspirone  at a low dose to  manage anxiety. -Return in one month for reassessment.  Prediabetes A1c of 6.0, indicating good control. -Continue current management.  Medication Management -Provide prescription for 30 tablets of Imipramine  50mg .  Follow-up Schedule follow-up appointment for end of March to reassess back pain, anxiety, and medication management.    Return in about 6 weeks (around 05/26/2023).      Jeralene Mom, MD  [facility] [phone] (phone) [phone] (fax)  [facility]

## 2023-04-14 NOTE — Therapy (Signed)
OUTPATIENT PHYSICAL THERAPY TREATMENT NOTE / DISCHARGE SUMMARY Reporting from 03/04/2023 to 04/14/2023   Patient Name: Rodney Hall. MRN: [id number] DOB:Feb 19, 1946, 78 y.o., male Today's Date: 04/14/2023  END OF SESSION:  PT End of Session - 04/14/23 1417     Visit Number 10    Number of Visits 13    Date for PT Re-Evaluation 05/27/23    Authorization Type [employer] reporting period from 03/04/2023    PT Start Time 1306    PT Stop Time 1349    PT Time Calculation (min) 43 min    Activity Tolerance Patient tolerated treatment well;Patient limited by pain    Behavior During Therapy [facility] for tasks assessed/performed                Past Medical History:  Diagnosis Date   Abdominal mass    Abdominal pain 06/02/2017   Anxiety    Pt. denies   Arthritis    pt. 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   Past Surgical History:  Procedure Laterality Date   ANTERIOR LAT LUMBAR FUSION Right 07/30/2016   Procedure: Right Lateral two-three Lateral transpsoas interbody fusion with lateral plating/Minimally invasive decompression at Lumbar two-three;  Surgeon: Ditty, Loura Halt, MD;  Location: [facility] OR;  Service: Neurosurgery;  Laterality: Right;   BACK SURGERY  2012   CATARACT EXTRACTION, BILATERAL     CHOLECYSTECTOMY N/A 07/25/2017   Procedure: LAPAROSCOPIC CHOLECYSTECTOMY WITH INTRAOPERATIVE CHOLANGIOGRAM;  Surgeon: Luretha Murphy, MD;  Location: [facility] ORS;  Service: General;  Laterality: N/A;   COLONOSCOPY WITH PROPOFOL N/A 12/12/2017   Procedure: COLONOSCOPY WITH PROPOFOL;  Surgeon: Scot Jun, MD;  Location:  [facility] ENDOSCOPY;  Service: Endoscopy;  Laterality: N/A;   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   Procedure: CORONARY ARTERY BYPASS GRAFTING (CABG) TIMES FIVE, USING LEFT INTERNAL MAMMARY ARTERY AND RIGHT GREATER SAPHENOUS VEIN HARVESTED ENDOSCOPICALLY;  Surgeon: [name], MD;  Location: [facility] OR;  Service: Open Heart Surgery;  Laterality: N/A;   EUS N/A 07/10/2017   Procedure: UPPER ENDOSCOPIC ULTRASOUND (EUS) LINEAR;  Surgeon: Rachael Fee, MD;  Location: [facility] ENDOSCOPY;  Service: Endoscopy;  Laterality: N/A;   EYE SURGERY     HEMI-MICRODISCECTOMY LUMBAR LAMINECTOMY LEVEL 1  09/2018   [facility]   IABP INSERTION N/A 09/06/2022   Procedure: IABP Insertion;  Surgeon: [place], Peter M, MD;  Location: [facility] INVASIVE CV LAB;  Service: Cardiovascular;  Laterality: N/A;   LEFT HEART CATH AND CORONARY ANGIOGRAPHY N/A 09/04/2022   Procedure: LEFT HEART CATH AND CORONARY ANGIOGRAPHY;  Surgeon: Yvonne Kendall, MD;  Location: [facility] INVASIVE CV LAB;  Service: Cardiovascular;  Laterality: N/A;   LEFT HEART CATH AND CORONARY ANGIOGRAPHY N/A 09/06/2022   Procedure: LEFT HEART CATH AND CORONARY ANGIOGRAPHY;  Surgeon: [place], Peter M, MD;  Location: [facility] INVASIVE CV LAB;  Service: Cardiovascular;  Laterality: N/A;   NECK SURGERY  2000   TEE WITHOUT CARDIOVERSION N/A 09/07/2022   Procedure: TRANSESOPHAGEAL ECHOCARDIOGRAM;  Surgeon: [name], MD;  Location: 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   Coronary artery disease  09/04/2022   NSTEMI (non-ST elevated myocardial infarction) (HCC) 09/03/2022   Primary hypertension 09/03/2022   Abnormal urine 04/17/2022   Cyst of kidney, acquired 04/17/2022   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   Chronic kidney disease due to type 2 diabetes mellitus (HCC) 04/17/2022   Chronic kidney disease, stage 3b (HCC) 10/11/2021   History of spinal fusion 03/13/2020   Hypertriglyceridemia 01/11/2019   History of colon polyps 01/11/2019   Abdominal mass    Impingement syndrome of shoulder region 06/30/2017   Pancreatic cyst    Low back pain 06/05/2016   Lumbosacral spondylosis with radiculopathy 06/05/2016   Benign prostatic hyperplasia with urinary obstruction 04/06/2015   Interstitial cystitis 04/06/2015    PCP: Malva Limes, MD  REFERRING PROVIDER: Arman Bogus, MD (neurosurgery)  REFERRING DIAG: other spondylosis with radiculopathy, lumbosacral region  Rationale for Evaluation and Treatment: Rehabilitation  THERAPY DIAG:  Other low back pain  Difficulty in walking, not elsewhere classified  Neuralgia and neuritis  ONSET DATE: chronic with history of multiple surgeries over the years (at least 10 years ago), but recently has had more pain in the last 3 months prior to PT evaluation.   SUBJECTIVE:                                                                                                                                                                                           PERTINENT HISTORY:  Patient is a 78 y.o. male who presents to outpatient physical therapy with a referral for medical diagnosis other spondylosis with radiculopathy, lumbosacral region. This patient's chief complaints consist of worsening of chronic low back and left glute pain leading to the following functional deficits: difficulty anything that requires standing or walking, using a push  mower, cleaning up cars, going to the grocery store/shopping, walking > 5 min, standing > 3 min. Relevant past medical history and comorbidities include  Benign prostatic hyperplasia with urinary obstruction; Prediabetes; Interstitial cystitis; Pancreatic cyst; Impingement syndrome of shoulder region; Abdominal mass; Hypertriglyceridemia; History of colon polyps; History of spinal fusion; Chronic  kidney disease, stage 3b [facility]); NSTEMI (non-ST elevated myocardial infarction) (HCC); Primary hypertension; Coronary artery disease; s/p cabg x 5; and Hyperlipidemia, mixed on their problem list. Has PMH of Abdominal pain (06/02/2017), Anxiety, Arthritis, Basal cell carcinoma (01/21/2017), BPH with obstruction/lower urinary tract symptoms,Choledocholithiasis with acute cholecystitis, History of actinic keratoses, Interstitial cystitis, Pancreatic cyst, and Squamous cell carcinoma of skin (08/14/2016).  has a past surgical history that includes Back surgery (2012); Neck surgery (2000); Cataract extraction, bilateral; Tonsillectomy and adenoidectomy; Anterior lat lumbar fusion (Right, 07/30/2016); EUS (N/A, 07/10/2017); Tennis elbow release/nirschel procedure; Eye surgery; Cholecystectomy (N/A, 07/25/2017); Colonoscopy with propofol (N/A, 12/12/2017); Hemi-microdiscectomy lumbar laminectomy level 1 (09/2018); LEFT HEART CATH AND CORONARY ANGIOGRAPHY (N/A, 09/04/2022); Coronary artery bypass graft (N/A, 09/07/2022); TEE without cardioversion (N/A, 09/07/2022); LEFT HEART CATH AND CORONARY ANGIOGRAPHY (N/A, 09/06/2022); and IABP Insertion (N/A, 09/06/2022). Patient denies hx of stroke, seizures, lung problems, diabetes, unexplained weight loss, unexplained changes in bowel or bladder problems, unexplained stumbling or dropping things, and osteoporosis  SUBJECTIVE STATEMENT: Patient arrives to session for progress note at tenth PT visit to assess progress in condition since the initial evaluation on 03/04/2023.  Worse pain last night -  sat on heating pad and pain went away Echo went well - heart works as well as it could work  Still has shortness of breath with exertions but thinks it could be from holding his breath due to pain  Blood work came back good  Feels like there has been a little bit of improvement but not a lot DN helps for a couple days and then wears off  Likes to do yard work and wash the cars - has learned to take seated rest breaks while doing these tasks so he can continue to do what he enjoys  Has been doing HEP - pain level still increase during exercises so they are still challenging  Primary doctor prescribed him pain medication today that he is going to fill this afternoon    PAIN: Are you having pain? Yes NPRS: Current: 1/10 in standing, 1/10 while sitting  Pain location: left glute, hip, and low back     LEISURE: doing stuff around the house, mowing the yard, cleaning cars up  PRECAUTIONS: None  PATIENT GOALS: "would like the pain to go away in my left hip"  NEXT MD VISIT: no appointment scheduled currently  OBJECTIVE  SELF-REPORTED FUNCTION FOTO score: 50/100 (Lumbar Spine questionnaire)    SELF-REPORTED FUNCTION Patient Specific Functional Scale (PSFS)  Walking: 5 Standing: 4 Washing Cars: 4 Average: 4.3   (Average at visit #2: 2)  FUNCTIONAL/BALANCE TESTS: 6 Minute Walk Test: 1000 feet with no AD  Patient discontinued due to pain with 1 min 45 seconds remaining in test. Low back and left glute pain 9/10. Antalgic gait by end of test. Eased immediately to 2-3/10 with seated rest.   From 03/17/23: 1152 feet with no AD, up to 8/10 pain in left glute and developed antalgic gait by end of test.    TREATMENT  Therapeutic exercise: to centralize symptoms and improve ROM, strength, muscular endurance, and activity tolerance required for successful completion of  functional activities.     Hooklying lower trunk rotation    2x10    Education on HEP including handout   Therapeutic activities: dynamic activities for functional strengthening and improved functional activity tolerance.    Deadlifts with 10# DB in each hand (free standing) to improve ability to pick things up    1x10       Deadlifts with 10# DB in each hand to 17 inch chair to improve ability to pick things up   1x10   Education on trying Rollator during community ambulating to be able to sit down if he needs to.      Education on HEP including handout for how to perform dead lifts at home  Physical Performance Measures:   6-Minute Walk Test (see above for written report)      Therex: 5 min Theract: 19 min  Physical Performance Measure: 8 min (-9 min PSFS, -2 min FOTO)   PATIENT EDUCATION:  Education details:  Exercise purpose/form. Self management techniques. Discharge recommendations.  Person educated: Patient Education method: Explanation, Demonstration, Tactile cues, Verbal cues, and Handouts Education comprehension: verbalized understanding and returned demonstration  HOME EXERCISE PROGRAM: Access Code: [email or web address] URL: [email or web address] Date: 04/14/2023 Prepared by: Romesha Scherer [place]  Exercises - Seated Slump Nerve Glide  - 2 x daily - 1 sets - 20 reps - Supine Piriformis Stretch with Leg Straight  - 2 x daily - 3 sets 1 minute hold - Supine Lower Trunk Rotation  - 1 x daily - 2 sets - 10 reps - Supine Hamstring Stretch with Strap  - 2 x daily - 1 sets - 10 reps - 5 seconds hold - Hamstring Curl with Weight Machine  - 3-4 x weekly - 3 sets - 20 reps - Goblet Squat with Kettlebell  - 3-4 x weekly - 3 sets - 20 reps - Seated Shoulder Row with Anchored Resistance  - 3 x weekly - 2 sets - 20 reps - Standing Anti-Rotation Press with Anchored Resistance  - 3 x weekly - 2 sets - 10 reps - Staggered Bridge  - 3 x weekly - 2 sets - 10 reps - Deadlift  With Dumbbells  - 1 x daily - 3 sets - 10 reps  Patient Education - TENS Therapy - TENS Unit   HOME EXERCISE PROGRAM [[email or web address]]  Self-Mobilization to Glutes with [employer]  -  Hold 5 Minutes max, Perform 1-2 Times a Day  ASSESSMENT:  CLINICAL IMPRESSION: Patient has attended 10 skilled physical therapy treatment sessions this episode of care and met 3 out 6 stated goals. Patient is a 78 y.o. male referred to outpatient physical therapy with a medical diagnosis of other spondylosis with radiculopathy, lumbosacral region who presents with signs and symptoms consistent with lower extremity strength/endurance deficits, lumbar spine range of motion deficits, and lumbar spine pain with radiating symptoms to the left lower extremity.  Focus of current treatment sessions has been to progressively increase trunk/lower extremity/functional strengthening and ROM exercises as tolerated and incorporating dry needling and manual therapy for pain and tissue tension. Patient demonstrates improvement in function based on patient reported outcome measures. Patient continues to present with with significant functional capacity and endurance impairments and reduced activity tolerance due to pain that are limiting his ability to complete tasks such as walking, standing, washing cars, and yard work without difficulty. Patient discontinued 6-Minute Walk Test with  1 minute and 45 seconds remaining due to 9/10 pain in his low back and left glute region. Baseline for the was 1152 feet and today's reassessment was 1000 feet with severe pain being the limiting factor. Remaining 3 out of 6 stated goals were not met and patient agrees to discharge from PT due to not seeing desired improvement in objective measures or standing tolerance. Patient wants to continue HEP at home and see how it goes. Patient provided updated handout for HEP to continue with self-management and education on using a rollator when community  ambulating and suddenly needs to sit down.    From Initial PT evaluation on 03/03/2024: Patient is a 78 y.o. male referred to outpatient physical therapy with a medical diagnosis of other spondylosis with radiculopathy, lumbosacral region who presents with signs and symptoms consistent with chronic low back pain with referral to left gluteal notch with positive bilateral SLR suggesting nerve root irritation as a contributing factor. He presents with pattern consistent with symptomatic lumbar stenosis.  Patient presents with significant pain, posture, ROM, joint stiffness, neurodynamic, muscle tension, muscle performance (strength/power/endurance) and activity tolerance impairments that are limiting ability to complete anything that requires standing or walking, using a push mower, cleaning up cars, going to the grocery store/shopping, walking > 5 min, standing > 3 min without difficulty. Patient will benefit from skilled physical therapy intervention to address current body structure impairments and activity limitations to improve function and work towards goals set in current POC in order to return to prior level of function or maximal functional improvement.   OBJECTIVE IMPAIRMENTS: Abnormal gait, decreased activity tolerance, decreased balance, decreased endurance, decreased knowledge of condition, decreased knowledge of use of DME, decreased mobility, difficulty walking, decreased ROM, decreased strength, hypomobility, impaired perceived functional ability, increased muscle spasms, impaired flexibility, improper body mechanics, postural dysfunction, and pain.   ACTIVITY LIMITATIONS: carrying, lifting, standing, sleeping, stairs, reach over head, locomotion level, and caring for others  PARTICIPATION LIMITATIONS: meal prep, cleaning, laundry, interpersonal relationship, shopping, community activity, yard work, and   difficulty anything that requires standing or walking, using a push mower, cleaning up  cars, going to the grocery store/shopping, walking > 5 min, standing > 3 min  PERSONAL FACTORS: Age, Past/current experiences, Time since onset of injury/illness/exacerbation, and 3+ comorbidities:   Benign prostatic hyperplasia with urinary obstruction; Prediabetes; Interstitial cystitis; Pancreatic cyst; Impingement syndrome of shoulder region; Abdominal mass; Hypertriglyceridemia; History of colon polyps; History of spinal fusion; Chronic kidney disease, stage 3b (HCC); NSTEMI (non-ST elevated myocardial infarction) (HCC); Primary hypertension; Coronary artery disease; s/p cabg x 5; and Hyperlipidemia, mixed on their problem list. Has PMH of Abdominal pain (06/02/2017), Anxiety, Arthritis, Basal cell carcinoma (01/21/2017), BPH with obstruction/lower urinary tract symptoms,Choledocholithiasis with acute cholecystitis, History of actinic keratoses, Interstitial cystitis, Pancreatic cyst, and Squamous cell carcinoma of skin (08/14/2016).  has a past surgical history that includes Back surgery (2012); Neck surgery (2000); Cataract extraction, bilateral; Tonsillectomy and adenoidectomy; Anterior lat lumbar fusion (Right, 07/30/2016); EUS (N/A, 07/10/2017); Tennis elbow release/nirschel procedure; Eye surgery; Cholecystectomy (N/A, 07/25/2017); Colonoscopy with propofol (N/A, 12/12/2017); Hemi-microdiscectomy lumbar laminectomy level 1 (09/2018); LEFT HEART CATH AND CORONARY ANGIOGRAPHY (N/A, 09/04/2022); Coronary artery bypass graft (N/A, 09/07/2022); TEE without cardioversion (N/A, 09/07/2022); LEFT HEART CATH AND CORONARY ANGIOGRAPHY (N/A, 09/06/2022); and IABP Insertion (N/A, 09/06/2022) are also affecting patient's functional outcome.   REHAB POTENTIAL: Fair due to nature and severity of condition and previously attempted intervention without success.   CLINICAL DECISION MAKING: Evolving/moderate complexity  EVALUATION COMPLEXITY: Moderate   GOALS: Goals reviewed with patient? No  SHORT TERM GOALS: Target date:  03/18/2023  Patient will be independent with initial home exercise program for self-management of symptoms. Baseline: Initial HEP to be provided at visit 2 as appropriate (03/04/23); initial HEP provided at visit #2 (03/17/2023); continued participation in HEP (04/14/23) Goal status: MET   LONG TERM GOALS: Target date: 05/27/2023  Patient will be independent with a long-term home exercise program for self-management of symptoms.  Baseline: Initial HEP to be provided at visit 2 as appropriate (03/04/23); Initial HEP to be provided at visit 2 as appropriate (03/04/23); initial HEP provided at visit #2 (03/17/2023); continued participation in HEP (04/14/23) Goal status: MET  2.  Patient will demonstrate improved FOTO to equal or greater than 47 by visit #14 to demonstrate improvement in overall condition and self-reported functional ability.  Baseline: 33 (03/04/23); 50 (04/14/23) Goal status: MET  3.  Patient will ambulate 1729 feet during 6 Minute Walk Test which is his age matched norm to demonstrate improved ability to walk community distances and go shopping.  Baseline: to be tested visit 2 as appropriate (03/04/23); 1152 feet with no AD, up to 8/10 pain in left glute and developed antalgic gait by end of set (03/17/2023); 1000 feet with no AD, 9/10 pain in left glute and low back, unable to finish test (04/14/23) Goal status: NOT MET  4.  Patient will report left glute pain of equal or less than 3/10 with functional activities to improve his ability to perform activities that require standing and walking.  Baseline: up to 8/10 (03/04/23); up to 9/10 with standing/walking (04/14/23) Goal status: NOT MET  5.  Patient will demonstrate improvement in Patient Specific Functional Scale (PSFS) of equal or greater than 3 points to reflect clinically significant improvement in patient's most valued functional activities. Baseline: to be measured visit 2 as appropriate (03/04/23); 2/10 (03/17/2023); 4.3  (04/14/23) Goal status: PARTIALLY MET    PLAN:  PT FREQUENCY: 1-2x/week  PT DURATION: 8-12 weeks  PLANNED INTERVENTIONS: 97164- PT Re-evaluation, 97110-Therapeutic exercises, 97530- Therapeutic activity, 97112- Neuromuscular re-education, 97535- Self Care, 16109- Manual therapy, [id number]- Gait training, [phone]- Aquatic Therapy, 97014- Electrical stimulation (unattended), [phone]- Electrical stimulation (manual), Patient/Family education, Balance training, Dry Needling, Joint mobilization, Spinal mobilization, DME instructions, Cryotherapy, and Moist heat.  PLAN FOR NEXT SESSION: Discharge from physical therapy.   Trayce Maino [place], SPT [employer] DPTE  [place]untley Dec R. Ilsa Iha, PT, DPT 04/14/23, 2:18 PM  [facility] [address] [place], [place] [address] P: [phone] I F: [phone]

## 2023-04-14 NOTE — Progress Notes (Signed)
PROVIDER NOTE: Information contained herein reflects review and annotations entered in association with encounter. Interpretation of such information and data should be left to medically-trained personnel. Information provided to patient can be located elsewhere in the medical record under "Patient Instructions". Document created using STT-dictation technology, any transcriptional errors that may result from process are unintentional.    Patient: Rodney Hall.  Service Category: E/M  Provider: Oswaldo Done, MD  DOB: Oct 13, 1945  DOS: 04/16/2023  Referring Provider: Malva Limes, MD  MRN: [id number]  Specialty: Interventional Pain Management  PCP: Malva Limes, MD  Type: Established Patient  Setting: Ambulatory outpatient    Location: Office  Delivery: Face-to-face     Primary Reason(s) for Visit: Encounter for evaluation before starting new chronic pain management plan of care (Level of risk: moderate)  CC: Back Pain (lower) and Hip Pain (Bilatera, worse in right  hip)  HPI  Rodney Hall is a 78 y.o. year old, male patient, who comes today for a follow-up evaluation to review the test results and decide on a treatment plan. He has Benign prostatic hyperplasia with urinary obstruction; Interstitial cystitis; Pancreatic cyst; Impingement syndrome of shoulder region; Abdominal mass; Hypertriglyceridemia; History of colon polyps; History of spinal fusion; Chronic kidney disease, stage 3b (HCC); NSTEMI (non-ST elevated myocardial infarction) (HCC); Primary hypertension; Coronary artery disease; S/P CABG x 5; Hyperlipidemia, mixed; Abnormal urine; Cyst of kidney, acquired; Low back pain; Lumbar post-laminectomy syndrome; Lumbosacral spondylosis with radiculopathy; Type 2 diabetes mellitus with diabetic chronic kidney disease (HCC); Abnormal CT scan, lumbar spine (06/17/2016); Chronic anticoagulation (Plavix); Failed back surgical syndrome; Chronic low back pain (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 His primarily concern today is the Back Pain (lower) and Hip Pain (Bilatera, worse in right  hip)  Pain Assessment: Location: Left Hip Radiating: denies Onset: More than a month ago Duration: Chronic pain Quality: Sharp Severity: 9 /10 (subjective, self-reported pain score)  Timing: Constant Modifying factors: sitting or lying down BP: 130/70  HR: 89  Rodney Hall comes in today for a follow-up visit after his initial evaluation on 04/02/2023. Today we went over the results of his tests. These were explained in "Layman's terms". During today's appointment we went over my diagnostic impression, as well as the proposed treatment plan.  Review of initial evaluation (04/02/2023): "The patient, with chronic back pain, presents for evaluation of his condition. He was referred by Dr. Mariah Milling for evaluation of her chronic back pain.   The patient has a history of chronic back pain following four spine surgeries, including one cervical surgery due to five herniated discs. The most recent surgery was in 2020. Since then, he has been receiving cortisone injections approximately every six months, but the last injection did not provide relief. He has also been undergoing dry needle therapy at the hospital's physical therapy department on [facility], performed by Illene Labrador, but has not found it helpful. An MRI was conducted by Dr. Yetta Barre, who determined that further surgery would not be beneficial. No leg pain, numbness, or weakness is present.   He experiences increased pain, particularly in the left hip, which causes her  to limp when walking long distances. There is also  some back pain and a little right hip pain, but the left hip is the primary source of discomfort.   In July, he had a myocardial infarction and underwent coronary artery bypass grafting, with five bypasses in total. He is currently on anticoagulation therapy and reports being 'pretty much over' the heart attack. She has been informed that he has a 'good heart' with no heart disease but is aware of the high risk associated with his cardiac history, especially within six months post-myocardial infarction."  No diagnostic tests ordered on 04/02/2023.  The patient was scheduled to return for review of a pending MRI and the procedure notes from Dr. Ethelene Hal.  The MRI was apparently done at [facility] on 02/18/2023.  Although the report was scanned into the system, it is of very poor quality and very difficult to read.  We are currently attempting to get a better copy from [facility].  Discussed the use of AI scribe software for clinical note transcription with the patient, who gave verbal consent to proceed.  History of Present Illness   Rodney Pusey. is a 78 year old male who presents for evaluation and management of chronic left lower back pain.  He describes his chronic left lower back pain as disabling, particularly affecting his left hip and buttocks area, with occasional radiation to his right hip by the end of the day. He experiences tingling down his left leg to his big toe, especially at night. The pain in his left hip is described as feeling like 'a screwdriver stuck in there'.  He has undergone multiple treatments, including facet injections with cortisone, which initially provided relief but were ineffective during the last treatment. He has had selected nerve blocks at two levels for moderate left lateral recess stenosis and foraminal stenosis. MRI findings indicate bilateral facet hypertrophy at  L1-2 and L2-3, and granulation tissue at L5-S1 surrounding the left S1 nerve root. He has a history of surgeries and epidural fibrosis.  The pain in his left heel is noticeable upon waking and tends to spread to his left hip and back as the day progresses. He has been experiencing this pain for more than five to ten years, as suggested by the development of facet hypertrophy.  He is currently on Plavix.      Patient presented with interventional treatment options. Rodney Hall was informed that I will not be providing medication management. Pharmacotherapy evaluation including recommendations may be offered, if specifically requested.   Controlled Substance Pharmacotherapy Assessment REMS (Risk Evaluation and Mitigation Strategy)  Opioid Analgesic: No chronic opioid analgesics therapy prescribed by our practice. None. MME/day: 0 mg/day  Pill Count: None expected due to no prior prescriptions written by our practice. No notes on file Pharmacokinetics: Liberation and absorption (onset of action): WNL Distribution (time to peak effect): WNL Metabolism and excretion (duration of action): WNL         Pharmacodynamics: Desired effects: Analgesia: Rodney Hall reports >50% benefit. Functional ability: Patient reports that medication allows him to accomplish basic ADLs Clinically meaningful improvement in function (CMIF): Sustained CMIF goals met Perceived effectiveness: Described as relatively effective, allowing for increase in activities of daily living (ADL) Undesirable effects: Side-effects or Adverse reactions: None reported Monitoring: [place] PMP: [name] Online review of the past 3-month period previously conducted. Not applicable at this point since we have not taken over the patient's medication management yet. List of other Serum/Urine Drug Screening Test(s):  No results  found for: "AMPHSCRSER", "BARBSCRSER", "BENZOSCRSER", "COCAINSCRSER", "COCAINSCRNUR",  "PCPSCRSER", "THCSCRSER", "THCU", "CANNABQUANT", "OPIATESCRSER", "OXYSCRSER", "PROPOXSCRSER", "ETH", "CBDTHCR", "D8THCCBX", "D9THCCBX" List of all UDS test(s) done:  No results found for: "TOXASSSELUR", "SUMMARY" Last UDS on record: No results found for: "TOXASSSELUR", "SUMMARY" UDS interpretation: No unexpected findings.          Medication Assessment Form: Not applicable. No opioids. Treatment compliance: Not applicable Risk Assessment Profile: Aberrant behavior: See initial evaluations. None observed or detected today Comorbid factors increasing risk of overdose: See initial evaluation. No additional risks detected today Opioid risk tool (ORT):      No data to display          ORT Scoring interpretation table:  Score <3 = Low Risk for SUD  Score between 4-7 = Moderate Risk for SUD  Score >8 = High Risk for Opioid Abuse   Risk of substance use disorder (SUD): Low  Risk Mitigation Strategies:  Patient opioid safety counseling: No controlled substances prescribed. Patient-Prescriber Agreement (PPA): No agreement signed.  Controlled substance notification to other providers: None required. No opioid therapy.  Pharmacologic Plan: Non-opioid analgesic therapy offered. Interventional alternatives discussed.             Laboratory Chemistry Profile   Renal Lab Results  Component Value Date   BUN 13 04/01/2023   CREATININE 1.38 (H) 04/01/2023   BCR 9 (L) 04/01/2023   GFRAA 58 (L) 01/13/2019   GFRNONAA 47 (L) 03/03/2023   SPECGRAV 1.015 11/28/2022   PHUR 5.5 11/28/2022   PROTEINUR Negative 11/28/2022     Electrolytes Lab Results  Component Value Date   NA 139 04/01/2023   K 4.8 04/01/2023   CL 103 04/01/2023   CALCIUM 9.9 04/01/2023   MG 2.3 09/08/2022   PHOS 3.5 02/22/2022     Hepatic Lab Results  Component Value Date   AST 12 04/01/2023   ALT 9 04/01/2023   ALBUMIN 4.3 04/01/2023   ALKPHOS 76 04/01/2023   LIPASE 41 03/03/2023     ID Lab Results   Component Value Date   SARSCOV2NAA NEGATIVE 09/06/2022   STAPHAUREUS NEGATIVE 09/06/2022   MRSAPCR NEGATIVE 09/06/2022   HCVAB <0.1 06/02/2017     Bone No results found for: "VD25OH", "VD125OH2TOT", "QI6962XB2", "WU1324MW1", "25OHVITD1", "25OHVITD2", "25OHVITD3", "TESTOFREE", "TESTOSTERONE"   Endocrine Lab Results  Component Value Date   GLUCOSE 125 (H) 04/01/2023   GLUCOSEU Negative 11/28/2022   HGBA1C 6.0 (A) 04/14/2023   TSH 2.960 05/29/2020     Neuropathy Lab Results  Component Value Date   HGBA1C 6.0 (A) 04/14/2023     CNS No results found for: "COLORCSF", "APPEARCSF", "RBCCOUNTCSF", "WBCCSF", "POLYSCSF", "LYMPHSCSF", "EOSCSF", "PROTEINCSF", "GLUCCSF", "JCVIRUS", "CSFOLI", "IGGCSF", "LABACHR", "ACETBL"   Inflammation (CRP: Acute  ESR: Chronic) No results found for: "CRP", "ESRSEDRATE", "LATICACIDVEN"   Rheumatology Lab Results  Component Value Date   ANA Negative 06/04/2017     Coagulation Lab Results  Component Value Date   INR 1.6 (H) 09/07/2022   LABPROT 19.1 (H) 09/07/2022   APTT 54 (H) 09/07/2022   PLT 329 04/01/2023     Cardiovascular Lab Results  Component Value Date   HGB 11.7 (L) 04/01/2023   HCT 36.9 (L) 04/01/2023     Screening Lab Results  Component Value Date   SARSCOV2NAA NEGATIVE 09/06/2022   STAPHAUREUS NEGATIVE 09/06/2022   MRSAPCR NEGATIVE 09/06/2022   HCVAB <0.1 06/02/2017     Cancer No results found for: "CEA", "CA125", "LABCA2"   Allergens No results found for: "ALMOND", "APPLE", "  ASPARAGUS", "AVOCADO", "BANANA", "BARLEY", "BASIL", "BAYLEAF", "GREENBEAN", "LIMABEAN", "WHITEBEAN", "BEEFIGE", "REDBEET", "BLUEBERRY", "BROCCOLI", "CABBAGE", "MELON", "CARROT", "CASEIN", "CASHEWNUT", "CAULIFLOWER", "CELERY"     Note: Lab results reviewed.  Recent Diagnostic Imaging Review  Lumbosacral Imaging: Lumbar MR wo contrast: Results for orders placed during the hospital encounter of 02/10/10 MR Lumbar Spine Wo  Contrast  Narrative Clinical Data: Low back pain.  No radicular symptoms.  MRI LUMBAR SPINE WITHOUT CONTRAST  Technique:  Multiplanar and multiecho pulse sequences of the lumbar spine were obtained without intravenous contrast.  Comparison: None.  Findings: The numbering convention used for this exam terms L5-S1 as the last full intervertebral disc space above the sacrum.  Trace anterolisthesis of L3 on L4 is present.  The lower lumbar spinal canal is congenitally narrowed secondary to short pedicles.  Spinal cord terminates posterior to the L1-L2 interspace.  The paraspinal soft tissues are within normal limits.  L1-L2:  Mild disc desiccation. L2-L3:  Negative. L3-L4:  Trace anterolisthesis.  Disc desiccation with shallow broad- based posterior protrusion.  Bilateral facet hypertrophy and ligamentum flavum redundancy produce moderate central stenosis with narrowing of the lateral recesses.  Moderate bilateral foraminal stenosis, left greater than right associated with short pedicles and broad-based posterior disc protrusion. L4-L5:  Disc desiccation with broad-based shallow posterior bulge. Facet hypertrophy.  Mild central stenosis.  Crowding of the lateral recesses without definite neural compression.  Mild to moderate symmetric bilateral foraminal stenosis due to congenital and acquired factors. L5-S1:  Disc desiccation with small concentric central annular tear and associated shallow protrusion.  Despite the tear and protrusion, there is no significant stenosis at L5-S1.  IMPRESSION:  1.  L3-L4 trace anterolisthesis with bilateral facet hypertrophy, ligamentum flavum redundancy and broad-based posterior protrusion that produce moderate central stenosis with crowding of both lateral recesses.  Left  greater than right foraminal stenosis at L3-L4. 2.  Less pronounced degenerative disease at L4-L5 with shallow broad-based posterior bulge, mild central stenosis and  mild symmetric bilateral foraminal stenosis. 3.  L5-S1 central concentric annular tear with shallow protrusion. No stenosis.  Provider: Prescott Parma  Lumbar CT wo contrast: Results for orders placed during the hospital encounter of 06/17/16 CT LUMBAR SPINE WO CONTRAST  Narrative CLINICAL DATA:  Low back pain and pinching for 3 months. No reported Leg pain.  EXAM: CT LUMBAR SPINE WITHOUT CONTRAST  TECHNIQUE: Multidetector CT imaging of the lumbar spine was performed without intravenous contrast administration. Multiplanar CT image reconstructions were also generated.  COMPARISON:  Intraoperative radiograph 03/13/2010. Plain films most recent 06/05/2016.  FINDINGS: Segmentation: Standard.  Alignment: Anatomic except for trace anterolisthesis L2-3, 1-2 mm as seen on sagittal reformatted imaging. Anatomic alignment across a fusion construct L3 through L5.  Vertebrae: Status post L3-L5 XLIF augmented with RIGHT L3-L5 pedicle screw construct. Developing interbody arthrodesis across L4-5. Slight lucency surrounding the RIGHT L5 screw. No solid interbody arthrodesis L3-4. No solid posterior arthrodesis at any level. No worrisome osseous lesion.  Paraspinal and other soft tissues: Aortic atherosclerosis. No hydronephrosis or paravertebral mass.  Disc levels:  L1-L2:  Unremarkable.  L2-L3: Central protrusion. Posterior element hypertrophy. Disc material extends into both foramina. RIGHT greater than LEFT L2 and L3 nerve root impingement is likely.  L3-L4: Pseudarthrosis. No solid interbody or posterior fusion. Facet arthropathy. No definite impingement.  L4-L5: Solid interbody arthrodesis. Posterior element hypertrophy. Mild stenosis. No definite L4 or L5 nerve root impingement.  L5-S1: Central disc protrusion. Facet arthropathy. BILATERAL L5 and S1 nerve root impingement suspected.  IMPRESSION: Status post  L3-L5 XLIF. Pseudarthrosis at L3-4. Solid  arthrodesis L4-5.  Adjacent segment disease at L2-3 and L5-S1 related to central protrusions and posterior element hypertrophy potentially affecting the subarticular zone and foraminal zone nerve roots. Trace anterolisthesis L2-3. If further investigation desired, consider lumbar myelogram and postmyelogram CT to evaluate for relative degree of stenosis and/or dynamic instability.   Electronically Signed By: Elsie Stain M.D. On: 06/17/2016 12:37  (02/04/2023) Lumbar MRI with and without contrast [facility]) reported by [facility] Findings: Segmentation: Standard.  Lowest well-formed disc space labeled the L5-S1 level. Alignment: Straightening of the normal lumbar lordosis.  Trace degenerative anterolisthesis of L2 on L3, stable. Vertebra: Susceptibility artifact related to prior left at L3-4 and L4-5.  Additional interbody and right lateral fusion at L2-3.  Vertebral body height maintained without acute or chronic fracture.  Bone marrow signal intensity within normal limits.  No discrete or worrisome osseous lesions.  No abnormal marrow edema or enhancement. Conus medullaris and cauda equina: Conus extends to the L1-2 level.  Conus and cauda equina appear normal. Paraspinal and other soft tissue: Paraspinous soft tissues within normal limits.  Few small T2 hyperintense simple left renal cyst noted, benign in appearance, no follow-up imaging recommended.  DISC LEVELS: T12-L1: Minimal disc bulge with endplate spurring.  Mild bilateral facet hypertrophy.  No canal or foraminal stenosis. L1-2: Normal interspace.  Mild bilateral facet hypertrophy.  No canal or foraminal stenosis. L2-3: Prior fusion.  Advanced intervertebral disc space narrowing with endplate osseous spurring.  Moderate bilateral facet hypertrophy.  Resultant mild canal with moderate left lateral recess stenosis.  Mild left foraminal remain patent. L3-4: Prior fusion.  Mild bilateral facet  hypertrophy.  Residual mild narrowing of the left lateral recess.  Central canal remains patent.  No significant foraminal stenosis. L4-5: Prior fusion.  Residual tiny central disc protrusion with annular fissure indents the ventral thecal sac.  Mild bilateral facet hypertrophy.  Residual mild bilateral subarticular stenosis.  Central canal remains patent.  Mild left L4 foraminal narrowing.  Right neural foramen remains patent. L5-S1: Disc desiccation with mild disc bulge and endplate spurring, asymmetric to the left.  Sequela of prior left hemilaminectomy with microdiscectomy.  Enhancing soft tissue density within the ventral epidural space and left lateral recess, consistent with postoperative granulation tissue (series 9, image 36).  Granulation tissue abuts and partially surrounds the descending left S1 nerve root.  Moderate right with mild left facet hypertrophy.  No significant residual canal or lateral recess stenosis.  Moderate bilateral L5 foraminal stenosis.  IMPRESSION: 1.  Prior fusion at L2-3 through L4-5.  Residual moderate left lateral recess stenosis at L2-3, mild left lateral recess stenosis at L3-4, and mild bilateral lateral recess narrowing at L4-5. 2.  Sequela of prior left hemilaminectomy with microdiscectomy at L5-S1.  Enhancing granulation tissue within the ventral epidural space and left lateral recess, abutting and partially surrounding the descending left S1 nerve root.  No significant residual or recurrent disc herniation at this level. 3.  Moderate bilateral L5 foraminal stenosis related to disc bulge, endplate spurring and facet hypertrophy.  Complexity Note: Imaging results reviewed.                         Meds   Current Outpatient Medications:    acetaminophen (TYLENOL) 325 MG tablet, Take 650 mg by mouth every 6 (six) hours as needed for moderate pain or headache., Disp: , Rfl:    aspirin EC 81  MG tablet, Take 1 tablet (81 mg total) by mouth daily. Swallow whole.,  Disp: 30 tablet, Rfl: 12   busPIRone (BUSPAR) 7.5 MG tablet, Start 1/2 tablet daily for 6 days then increase to 1/2 tablet twice a day for 6 days. Then 1 tablet twice days, Disp: 60 tablet, Rfl: 2   clopidogrel (PLAVIX) 75 MG tablet, Take 1 tablet (75 mg total) by mouth daily., Disp: 90 tablet, Rfl: 3   imipramine (TOFRANIL) 50 MG tablet, Take 1 tablet (50 mg total) by mouth at bedtime., Disp: 90 tablet, Rfl: 3   omeprazole (PRILOSEC) 20 MG capsule, Take 20 mg by mouth 2 (two) times daily., Disp: , Rfl:    oxyCODONE-acetaminophen (PERCOCET/ROXICET) 5-325 MG tablet, Take 1 tablet by mouth every 4 (four) hours as needed for up to 5 days for severe pain (pain score 7-10)., Disp: 30 tablet, Rfl: 0   tamsulosin (FLOMAX) 0.4 MG CAPS capsule, Take 1 capsule (0.4 mg total) by mouth daily after supper., Disp: 90 capsule, Rfl: 3   atorvastatin (LIPITOR) 10 MG tablet, Take 1 tablet (10 mg total) by mouth daily., Disp: 90 tablet, Rfl: 3   pantoprazole (PROTONIX) 40 MG tablet, Take 1 tablet (40 mg total) by mouth daily for 14 days. (Patient taking differently: Take 40 mg by mouth daily. PRN), Disp: 14 tablet, Rfl: 0  ROS  Constitutional: Denies any fever or chills Gastrointestinal: No reported hemesis, hematochezia, vomiting, or acute GI distress Musculoskeletal: Denies any acute onset joint swelling, redness, loss of ROM, or weakness Neurological: No reported episodes of acute onset apraxia, aphasia, dysarthria, agnosia, amnesia, paralysis, loss of coordination, or loss of consciousness  Allergies  Rodney Hall is allergic to pravastatin and amoxicillin.  PFSH  Drug: Rodney Hall  reports no history of drug use. Alcohol:  reports no history of alcohol use. Tobacco:  reports that he has never smoked. He has never been exposed to tobacco smoke. He has never used smokeless tobacco. 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 Surgical: Rodney Hall  has a past surgical history that includes Back surgery (2012); Neck surgery (2000); Cataract extraction, bilateral; Tonsillectomy and adenoidectomy; Anterior lat lumbar fusion (Right, 07/30/2016); EUS (N/A, 07/10/2017); Tennis elbow release/nirschel procedure; Eye surgery; Cholecystectomy (N/A, 07/25/2017); Colonoscopy with propofol (N/A, 12/12/2017); Hemi-microdiscectomy lumbar laminectomy level 1 (09/2018); LEFT HEART CATH AND CORONARY ANGIOGRAPHY (N/A, 09/04/2022); Coronary artery bypass graft (N/A, 09/07/2022); TEE without cardioversion (N/A, 09/07/2022); LEFT HEART CATH AND CORONARY ANGIOGRAPHY (N/A, 09/06/2022); and IABP Insertion (N/A, 09/06/2022). Family: family history includes Cancer in his mother; Diabetes in his brother and father; Heart disease in his sister; Rheumatic fever in his sister.  Constitutional Exam  General appearance: Well nourished, well developed, and well hydrated. In no apparent acute distress Vitals:   04/16/23 1426  BP: 130/70  Pulse: 89  Resp: 16  Temp: 97.9 F (36.6 C)  TempSrc: Temporal  SpO2: 100%  Weight: 190 lb (86.2 kg)  Height: 5\' 10"  (1.778 m)   BMI Assessment: Estimated body mass index is 27.26 kg/m as calculated from the following:   Height as of this encounter: 5\' 10"  (1.778 m).   Weight as of this encounter: 190 lb (86.2 kg).  BMI interpretation table: BMI level Category Range association with higher incidence of chronic  pain  <18 kg/m2 Underweight   18.5-24.9 kg/m2 Ideal body weight   25-29.9 kg/m2 Overweight Increased incidence by 20%  30-34.9 kg/m2 Obese (Class I) Increased incidence by 68%  35-39.9 kg/m2 Severe obesity (Class II) Increased incidence by 136%  >40  kg/m2 Extreme obesity (Class III) Increased incidence by 254%   Patient's current BMI Ideal Body weight  Body mass index is 27.26 kg/m. Ideal body weight: 73 kg (160 lb 15 oz) Adjusted ideal body weight: 78.3 kg (172 lb 9 oz)   BMI Readings from Last 4 Encounters:  04/16/23 27.26 kg/m  04/14/23 28.12 kg/m  04/02/23 27.26 kg/m  04/01/23 27.78 kg/m   Wt Readings from Last 4 Encounters:  04/16/23 190 lb (86.2 kg)  04/14/23 196 lb (88.9 kg)  04/02/23 190 lb (86.2 kg)  04/01/23 193 lb 9.6 oz (87.8 kg)    Psych/Mental status: Alert, oriented x 3 (person, place, & time)       Eyes: PERLA Respiratory: No evidence of acute respiratory distress  Physical Exam   MUSCULOSKELETAL: Left hip pain on movement, restriction in range of motion, discomfort during Patrick's maneuver, pain localized to lower back and buttocks, not directly in hip joint.       Assessment & Plan  Primary Diagnosis & Pertinent Problem List: The primary encounter diagnosis was Chronic anticoagulation (Plavix). Diagnoses of Failed back surgical syndrome, Chronic pain of left lower extremity, Epidural fibrosis, Lumbosacral radiculopathy at S1 (Left), Lumbosacral radiculopathy at L5 (Left), Lumbar facet hypertrophy (Multilevel) (Bilateral), and Abnormal MRI, lumbar spine (02/04/2023) [facility]) were also pertinent to this visit. Visit Diagnosis: 1. Chronic anticoagulation (Plavix)   2. Failed back surgical syndrome   3. Chronic pain of left lower extremity   4. Epidural fibrosis   5. Lumbosacral radiculopathy at S1 (Left)   6. Lumbosacral radiculopathy at L5 (Left)   7. Lumbar facet hypertrophy (Multilevel) (Bilateral)   8. Abnormal MRI, lumbar spine (02/04/2023) [facility])    Problems updated and reviewed during this visit: Problem  Chronic lower extremity pain (Left)  Epidural Fibrosis  Lumbosacral radiculopathy at S1 (Left)  Lumbosacral radiculopathy at L5 (Left)   Lumbar facet hypertrophy (Multilevel) (Bilateral)  Abnormal MRI, lumbar spine (02/04/2023) [facility])   (02/04/2023) Lumbar MRI with and without contrast [facility]) reported by [facility] [place] Findings: Segmentation: Standard.  Lowest well-formed disc space labeled the L5-S1 level. Alignment: Straightening of the normal lumbar lordosis.  Trace degenerative anterolisthesis of L2 on L3, stable. Vertebra: Susceptibility artifact related to prior left at L3-4 and L4-5.  Additional interbody and right lateral fusion at L2-3.  Vertebral body height maintained without acute or chronic fracture.  Bone marrow signal intensity within normal limits.  No discrete or worrisome osseous lesions.  No abnormal marrow edema or enhancement. Conus medullaris and cauda equina: Conus extends to the L1-2 level.  Conus and cauda equina appear normal. Paraspinal and other soft tissue: Paraspinous soft tissues within normal limits.  Few small T2 hyperintense simple left renal cyst noted, benign in appearance, no follow-up imaging recommended.  DISC LEVELS: T12-L1: Minimal disc bulge with endplate spurring.  Mild bilateral facet hypertrophy.  No canal or foraminal stenosis. L1-2: Normal interspace.  Mild bilateral facet hypertrophy.  No canal or foraminal stenosis. L2-3: Prior fusion.  Advanced intervertebral disc space narrowing with endplate osseous spurring.  Moderate bilateral facet hypertrophy.  Resultant mild canal with moderate left lateral recess stenosis.  Mild left foraminal remain patent.  L3-4: Prior fusion.  Mild bilateral facet hypertrophy.  Residual mild narrowing of the left lateral recess.  Central canal remains patent.  No significant foraminal stenosis. L4-5: Prior fusion.  Residual tiny central disc protrusion with annular fissure indents the ventral thecal sac.  Mild bilateral facet hypertrophy.  Residual mild bilateral subarticular stenosis.  Central  canal remains patent.  Mild left L4 foraminal narrowing.  Right neural foramen remains patent. L5-S1: Disc desiccation with mild disc bulge and endplate spurring, asymmetric to the left.  Sequela of prior left hemilaminectomy with microdiscectomy.  Enhancing soft tissue density within the ventral epidural space and left lateral recess, consistent with postoperative granulation tissue (series 9, image 36).  Granulation tissue abuts and partially surrounds the descending left S1 nerve root.  Moderate right with mild left facet hypertrophy.  No significant residual canal or lateral recess stenosis.  Moderate bilateral L5 foraminal stenosis.  IMPRESSION: 1.  Prior fusion at L2-3 through L4-5.  Residual moderate left lateral recess stenosis at L2-3, mild left lateral recess stenosis at L3-4, and mild bilateral lateral recess narrowing at L4-5. 2.  Sequela of prior left hemilaminectomy with microdiscectomy at L5-S1.  Enhancing granulation tissue within the ventral epidural space and left lateral recess, abutting and partially surrounding the descending left S1 nerve root.  No significant residual or recurrent disc herniation at this level. 3.  Moderate bilateral L5 foraminal stenosis related to disc bulge, endplate spurring and facet hypertrophy.      Plan of Care  Assessment and Plan    Epidural Fibrosis with Left S1 Radiculopathy Scar tissue surrounds the left S1 nerve root, causing pain radiating down the left leg to the big toe, with tingling and pain in the left heel, hip, and buttocks. Scar tissue can cause entrapment neuropathy by compressing the nerve during scar reorganization. Potential treatments include a caudal epidural steroid injection and the RACZ procedure, which may provide relief for 3 to 18 months but will not eliminate the scar tissue. Order a caudal epidural steroid injection with contrast to assess scar tissue involvement. Ensure no allergy to contrast. Stop Plavix 7 days prior to the  procedure. Consider the RACZ procedure if the diagnostic injection indicates scar tissue involvement.  Degenerative Anterolisthesis of L2 over L3 Trace degenerative anterolisthesis of L2 over L3 causes spinal misalignment and facet joint pressure, contributing to pain. Add a picture of anterolisthesis to the after-visit summary.  Lumbar Facet Hypertrophy Bilateral facet hypertrophy at L1-2 and L2-3 results from long-standing degenerative changes, contributing to chronic pain. Insurance limitations restrict treatment to two levels, which may not provide complete relief if multiple levels are involved. Discuss insurance limitations on treating multiple levels. Consider future treatments for facet joint pain if needed.  Follow-up Schedule a caudal epidural steroid injection. Monitor response to the injection and consider the RACZ procedure if indicated.      Pharmacotherapy (Medications Ordered): No orders of the defined types were placed in this encounter.  Procedure Orders         Caudal Epidural Injection     Lab Orders  No laboratory test(s) ordered today   Imaging Orders  No imaging studies ordered today   Referral Orders  No referral(s) requested today    Pharmacological management:  Opioid Analgesics: I will not be prescribing any opioids at this time Membrane stabilizer: I will not be prescribing any at this time Muscle relaxant: I will not be prescribing any at this time NSAID: I will not be prescribing any at this  time Other analgesic(s): I will not be prescribing any at this time      Interventional Therapies  Risk Factors  Considerations  Medical Comorbidities:  Plavix Anticoagulation: (Stop: 7-10 days  Restart: 2 hours)  CAD  Hx. NSTEMI  S/P CABG x5  Stage 3b CKD  T2NIDDM     Planned  Pending:   Diagnostic/therapeutic left caudal ESI + diagnostic epidurogram #1    Under consideration:   Diagnostic/therapeutic left caudal ESI + diagnostic epidurogram #1   Possible Racz procedure    Completed:   None at this time   Therapeutic  Palliative (PRN) options:   None established   Completed by other providers:   Surgery: Right L2-3 lateral transpsoas interbody fusion w/ lateral plating and L2-3 decompression (07/30/2016) by Hulan Saas, MD (Neurosurgery)  Surgery: Left L3-4 and L4-5 anterolateral fusion with interbody spacer and right L3-4 and L4-5 pedicle screws (03/13/2010) by Aliene Beams, MD (Neurosurgery)  [facility] Procedures Date Name LateralityProvider Name and Address 12/03/2022 G-RDR SNRB                                             Sheran Luz, MD 01/17/2022 G[facility] 2 Level                                          Sheran Luz, MD 07/03/2021 G-R[facility] 2 Level                               Otilio Miu 05/01/2021 G-JCB Trigger Point                                 Jene Every, MD 12/04/2020 G[facility] 2 Level                      Sheran Luz, MD 07/18/2020 G-RDR SNRB                                              Sheran Luz, MD 01/17/2020 [email or web address] Lumbar TFESIK U1 Level  Bufford Buttner, MD 09/25/2018 Lumbar Laminectomy    09/20/2018 LAMINOTOMY (HEMILAMINECTOMY), DECOMPRESSION OF NERVE ROOTS, PARTIAL FACECTOTOMY, FORAMINOTOMY AND/OR DISC REMOVAL, LUMBAR (SURG) Marsh Dolly 07/30/2016 Lumbar Laminectomy   April Webb 03/07/2011 Lumbar Laminectomy   April Webb 03/06/2001 Lumbar Laminectomy      Provider-requested follow-up: Return for (ECT): (L) Caudal ESI #1 + Epidurogram, (Blood Thinner Protocol). Recent Visits Date Type Provider Dept  04/02/23 Office Visit Delano Metz, MD [facility]  Showing recent visits within past 90 days and meeting all other requirements Today's Visits Date Type Provider Dept  04/16/23 Office Visit Delano Metz, MD [facility]  Showing today's visits and meeting all other requirements Future Appointments No visits were found meeting these  conditions. Showing future appointments within next 90 days and meeting all other requirements   Primary Care Physician: Malva Limes, MD  Duration of encounter: 81 minutes.  Total time on encounter, as per AMA guidelines included both the face-to-face and  non-face-to-face time personally spent by the physician and/or other qualified health care professional(s) on the day of the encounter (includes time in activities that require the physician or other qualified health care professional and does not include time in activities normally performed by clinical staff). Physician's time may include the following activities when performed: Preparing to see the patient (e.g., pre-charting review of records, searching for previously ordered imaging, lab work, and nerve conduction tests) Review of prior analgesic pharmacotherapies. Reviewing PMP Interpreting ordered tests (e.g., lab work, imaging, nerve conduction tests) Performing post-procedure evaluations, including interpretation of diagnostic procedures Obtaining and/or reviewing separately obtained history Performing a medically appropriate examination and/or evaluation Counseling and educating the patient/family/caregiver Ordering medications, tests, or procedures Referring and communicating with other health care professionals (when not separately reported) Documenting clinical information in the electronic or other health record Independently interpreting results (not separately reported) and communicating results to the patient/ family/caregiver Care coordination (not separately reported)  Note by: Oswaldo Done, MD (TTS technology used. I apologize for any typographical errors that were not detected and corrected.) Date: 04/16/2023; Time: 4:10 PM

## 2023-04-14 NOTE — Patient Instructions (Signed)
 Marland Kitchen  Please review the attached list of medications and notify my office if there are any errors.   . Please bring all of your medications to every appointment so we can make sure that our medication list is the same as yours.

## 2023-04-15 ENCOUNTER — Encounter: Payer: Self-pay | Admitting: Physical Therapy

## 2023-04-15 ENCOUNTER — Telehealth: Payer: Self-pay | Admitting: Urology

## 2023-04-15 ENCOUNTER — Telehealth: Payer: Self-pay | Admitting: Student in an Organized Health Care Education/Training Program

## 2023-04-15 DIAGNOSIS — N3010 Interstitial cystitis (chronic) without hematuria: Secondary | ICD-10-CM

## 2023-04-15 MED ORDER — IMIPRAMINE HCL 50 MG PO TABS: 50.0000 mg | ORAL_TABLET | Freq: Every day | ORAL | 3 refills | Status: DC

## 2023-04-15 NOTE — Telephone Encounter (Signed)
RX sent

## 2023-04-15 NOTE — Telephone Encounter (Signed)
He was discarding some medications and he accidentally thrown away imipramine (TOFRANIL) 50 MG tablet. He would like you to call in some more to [facility] on [address]. Please so he can get back on track

## 2023-04-15 NOTE — Telephone Encounter (Signed)
Error

## 2023-04-16 ENCOUNTER — Encounter: Payer: Self-pay | Admitting: Pain Medicine

## 2023-04-16 ENCOUNTER — Ambulatory Visit: Payer: HMO | Attending: Pain Medicine | Admitting: Pain Medicine

## 2023-04-16 VITALS — BP 130/70 | HR 89 | Temp 97.9°F | Resp 16 | Ht 70.0 in | Wt 190.0 lb

## 2023-04-16 DIAGNOSIS — G8929 Other chronic pain: Secondary | ICD-10-CM | POA: Diagnosis not present

## 2023-04-16 DIAGNOSIS — M5417 Radiculopathy, lumbosacral region: Secondary | ICD-10-CM | POA: Insufficient documentation

## 2023-04-16 DIAGNOSIS — Z7901 Long term (current) use of anticoagulants: Secondary | ICD-10-CM | POA: Insufficient documentation

## 2023-04-16 DIAGNOSIS — R937 Abnormal findings on diagnostic imaging of other parts of musculoskeletal system: Secondary | ICD-10-CM | POA: Insufficient documentation

## 2023-04-16 DIAGNOSIS — G96198 Other disorders of meninges, not elsewhere classified: Secondary | ICD-10-CM | POA: Diagnosis present

## 2023-04-16 DIAGNOSIS — M79605 Pain in left leg: Secondary | ICD-10-CM | POA: Insufficient documentation

## 2023-04-16 DIAGNOSIS — M47816 Spondylosis without myelopathy or radiculopathy, lumbar region: Secondary | ICD-10-CM | POA: Insufficient documentation

## 2023-04-16 DIAGNOSIS — M961 Postlaminectomy syndrome, not elsewhere classified: Secondary | ICD-10-CM | POA: Insufficient documentation

## 2023-04-16 NOTE — Patient Instructions (Addendum)
Please wait for [facility] to call you to let you know if you can pause Plavix 7 days.  ____________________________________________________________________    Procedure instructions  Stop blood-thinners  Do not eat or drink fluids (other than water) for 6 hours before your procedure  No water for 2 hours before your procedure  Take your blood pressure medicine with a sip of water  Arrive 30 minutes before your appointment  If sedation is planned, bring suitable driver. Pennie Banter, Benedetto Goad, & public transportation are NOT APPROVED)  Carefully read the "Preparing for your procedure" detailed instructions  If you have questions call us at [phone]  Procedure appointments are for procedures only. NO medication refills or new problem evaluations.   ______________________________________________________________________      ______________________________________________________________________    Preparing for your procedure  Appointments: If you think you may not be able to keep your appointment, call 24-48 hours in advance to cancel. We need time to make it available to others.  Procedure visits are for procedures only. During your procedure appointment there will be: NO Prescription Refills*. NO medication changes or discussions*. NO discussion of disability issues*. NO unrelated pain problem evaluations*. NO evaluations to order other pain procedures*. *These will be addressed at a separate and distinct evaluation encounter on the provider's evaluation schedule and not during procedure days.  Instructions: Food intake: Avoid eating anything solid for at least 8 hours prior to your procedure. Clear liquid intake: You may take clear liquids such as water up to 2 hours prior to your procedure. (No carbonated drinks. No soda.) Transportation: Unless otherwise stated by your physician, bring a driver. (Driver cannot be a Market researcher, Pharmacist, community, or any other form of public  transportation.) Morning Medicines: Except for blood thinners, take all of your other morning medications with a sip of water. Make sure to take your heart and blood pressure medicines. If your blood pressure's lower number is above 100, the case will be rescheduled. Blood thinners: Make sure to stop your blood thinners as instructed.  If you take a blood thinner, but were not instructed to stop it, call our office [phone] and ask to talk to a nurse. Not stopping a blood thinner prior to certain procedures could lead to serious complications. Diabetics on insulin: Notify the staff so that you can be scheduled 1st case in the morning. If your diabetes requires high dose insulin, take only  of your normal insulin dose the morning of the procedure and notify the staff that you have done so. Preventing infections: Shower with an antibacterial soap the morning of your procedure.  Build-up your immune system: Take 1000 mg of Vitamin C with every meal (3 times a day) the day prior to your procedure. Antibiotics: Inform the nursing staff if you are taking any antibiotics or if you have any conditions that may require antibiotics prior to procedures. (Example: recent joint implants)   Pregnancy: If you are pregnant make sure to notify the nursing staff. Not doing so may result in injury to the fetus, including death.  Sickness: If you have a cold, fever, or any active infections, call and cancel or reschedule your procedure. Receiving steroids while having an infection may result in complications. Arrival: You must be in the facility at least 30 minutes prior to your scheduled procedure. Tardiness: Your scheduled time is also the cutoff time. If you do not arrive at least 15 minutes prior to your procedure, you will be rescheduled.  Children: Do not bring any children  with you. Make arrangements to keep them home. Dress appropriately: There is always a possibility that your clothing may get soiled. Avoid  long dresses. Valuables: Do not bring any jewelry or valuables.  Reasons to call and reschedule or cancel your procedure: (Following these recommendations will minimize the risk of a serious complication.) Surgeries: Avoid having procedures within 2 weeks of any surgery. (Avoid for 2 weeks before or after any surgery). Flu Shots: Avoid having procedures within 2 weeks of a flu shots or . (Avoid for 2 weeks before or after immunizations). Barium: Avoid having a procedure within 7-10 days after having had a radiological study involving the use of radiological contrast. (Myelograms, Barium swallow or enema study). Heart attacks: Avoid any elective procedures or surgeries for the initial 6 months after a "Myocardial Infarction" (Heart Attack). Blood thinners: It is imperative that you stop these medications before procedures. Let us know if you if you take any blood thinner.  Infection: Avoid procedures during or within two weeks of an infection (including chest colds or gastrointestinal problems). Symptoms associated with infections include: Localized redness, fever, chills, night sweats or profuse sweating, burning sensation when voiding, cough, congestion, stuffiness, runny nose, sore throat, diarrhea, nausea, vomiting, cold or Flu symptoms, recent or current infections. It is specially important if the infection is over the area that we intend to treat. Heart and lung problems: Symptoms that may suggest an active cardiopulmonary problem include: cough, chest pain, breathing difficulties or shortness of breath, dizziness, ankle swelling, uncontrolled high or unusually low blood pressure, and/or palpitations. If you are experiencing any of these symptoms, cancel your procedure and contact your primary care physician for an evaluation.  Remember:  Regular Business hours are:  Monday to Thursday 8:00 AM to 4:00 PM  Provider's Schedule: Delano Metz, MD:  Procedure days: Tuesday and Thursday 7:30  AM to 4:00 PM  Edward Jolly, MD:  Procedure days: Monday and Wednesday 7:30 AM to 4:00 PM Last  Updated: 02/11/2023 ______________________________________________________________________      ______________________________________________________________________    General Risks and Possible Complications  Patient Responsibilities: It is important that you read this as it is part of your informed consent. It is our duty to inform you of the risks and possible complications associated with treatments offered to you. It is your responsibility as a patient to read this and to ask questions about anything that is not clear or that you believe was not covered in this document.  Patient's Rights: You have the right to refuse treatment. You also have the right to change your mind, even after initially having agreed to have the treatment done. However, under this last option, if you wait until the last second to change your mind, you may be charged for the materials used up to that point.  Introduction: Medicine is not an Visual merchandiser. Everything in Medicine, including the lack of treatment(s), carries the potential for danger, harm, or loss (which is by definition: Risk). In Medicine, a complication is a secondary problem, condition, or disease that can aggravate an already existing one. All treatments carry the risk of possible complications. The fact that a side effects or complications occurs, does not imply that the treatment was conducted incorrectly. It must be clearly understood that these can happen even when everything is done following the highest safety standards.  No treatment: You can choose not to proceed with the proposed treatment alternative. The "PRO(s)" would include: avoiding the risk of complications associated with the therapy. The "CON(s)"  would include: not getting any of the treatment benefits. These benefits fall under one of three categories: diagnostic; therapeutic; and/or  palliative. Diagnostic benefits include: getting information which can ultimately lead to improvement of the disease or symptom(s). Therapeutic benefits are those associated with the successful treatment of the disease. Finally, palliative benefits are those related to the decrease of the primary symptoms, without necessarily curing the condition (example: decreasing the pain from a flare-up of a chronic condition, such as incurable terminal cancer).  General Risks and Complications: These are associated to most interventional treatments. They can occur alone, or in combination. They fall under one of the following six (6) categories: no benefit or worsening of symptoms; bleeding; infection; nerve damage; allergic reactions; and/or death. No benefits or worsening of symptoms: In Medicine there are no guarantees, only probabilities. No healthcare provider can ever guarantee that a medical treatment will work, they can only state the probability that it may. Furthermore, there is always the possibility that the condition may worsen, either directly, or indirectly, as a consequence of the treatment. Bleeding: This is more common if the patient is taking a blood thinner, either prescription or over the counter (example: Goody Powders, Fish oil, Aspirin, Garlic, etc.), or if suffering a condition associated with impaired coagulation (example: Hemophilia, cirrhosis of the liver, low platelet counts, etc.). However, even if you do not have one on these, it can still happen. If you have any of these conditions, or take one of these drugs, make sure to notify your treating physician. Infection: This is more common in patients with a compromised immune system, either due to disease (example: diabetes, cancer, human immunodeficiency virus [HIV], etc.), or due to medications or treatments (example: therapies used to treat cancer and rheumatological diseases). However, even if you do not have one on these, it can still  happen. If you have any of these conditions, or take one of these drugs, make sure to notify your treating physician. Nerve Damage: This is more common when the treatment is an invasive one, but it can also happen with the use of medications, such as those used in the treatment of cancer. The damage can occur to small secondary nerves, or to large primary ones, such as those in the spinal cord and brain. This damage may be temporary or permanent and it may lead to impairments that can range from temporary numbness to permanent paralysis and/or brain death. Allergic Reactions: Any time a substance or material comes in contact with our body, there is the possibility of an allergic reaction. These can range from a mild skin rash (contact dermatitis) to a severe systemic reaction (anaphylactic reaction), which can result in death. Death: In general, any medical intervention can result in death, most of the time due to an unforeseen complication. ______________________________________________________________________      ______________________________________________________________________    Blood Thinners  IMPORTANT NOTICE:  If you take any of these, make sure to notify the nursing staff.  Failure to do so may result in serious injury.  Recommended time intervals to stop and restart blood-thinners, before & after invasive procedures  Generic Name Brand Name Pre-procedure: Stop medication for this amount of time before your procedure: Post-procedure: Wait this amount of time after the procedure before restarting your medication:  Abciximab Reopro 15 days 2 hrs  Alteplase Activase 10 days 10 days  Anagrelide Agrylin    Apixaban Eliquis 3 days 6 hrs  Cilostazol Pletal 3 days 5 hrs  Clopidogrel Plavix 7-10 days 2  hrs  Dabigatran Pradaxa 5 days 6 hrs  Dalteparin Fragmin 24 hours 4 hrs  Dipyridamole Aggrenox 11days 2 hrs  Edoxaban Lixiana; Savaysa 3 days 2 hrs  Enoxaparin  Lovenox 24 hours 4 hrs   Eptifibatide Integrillin 8 hours 2 hrs  Fondaparinux  Arixtra 72 hours 12 hrs  Hydroxychloroquine Plaquenil 11 days   Prasugrel Effient 7-10 days 6 hrs  Reteplase Retavase 10 days 10 days  Rivaroxaban Xarelto 3 days 6 hrs  Ticagrelor Brilinta 5-7 days 6 hrs  Ticlopidine Ticlid 10-14 days 2 hrs  Tinzaparin Innohep 24 hours 4 hrs  Tirofiban Aggrastat 8 hours 2 hrs  Warfarin Coumadin 5 days 2 hrs   Other medications with blood-thinning effects  Product indications Generic (Brand) names Note  Cholesterol Lipitor Stop 4 days before procedure  Blood thinner (injectable) Heparin (LMW or LMWH Heparin) Stop 24 hours before procedure  Cancer Ibrutinib (Imbruvica) Stop 7 days before procedure  Malaria/Rheumatoid Hydroxychloroquine (Plaquenil) Stop 11 days before procedure  Thrombolytics  10 days before or after procedures   Over-the-counter (OTC) Products with blood-thinning effects  Product Common names Stop Time  Aspirin > 325 mg Goody Powders, Excedrin, etc. 11 days  Aspirin <= 81 mg  7 days  Fish oil  4 days  Garlic supplements  7 days  Ginkgo biloba  36 hours  Ginseng  24 hours  NSAIDs Ibuprofen, Naprosyn, etc. 3 days  Vitamin E  4 days   ______________________________________________________________________      ____________________________________________________________________________________________  Spondylolisthesis  Spondylolisthesis is a condition that occurs when a vertebra in the spine slips out of place, usually in the lower back. Symptoms can vary from mild to severe, and a person may have no symptoms.  Some common symptoms include:  Back pain, especially chronic pain  Pain that radiates down the legs  Pain that worsens with exercise  Tightness in the hamstrings  Neck stiffness  Loss of spine flexibility  Weakness in the legs or trouble walking  Numbness and tingling in the groin and/or buttocks   Some causes of spondylolisthesis include: Birth  defects. Sudden injury. Abnormal wear on the cartilage and bones, such as arthritis. Bone disease and fractures. Certain sports activities, such as gymnastics, weightlifting, and football.  A doctor can diagnose spondylolisthesis with a physical exam, X-rays, and possibly a CT scan.    Forward slippage is known as "Anterolisthesis".  Backward slippage is known as "Retrolisthesis".   Pathophysiology of Spondylolisthesis:   Grading Classification of Spondylolisthesis Grade I spondylolisthesis is 1 to 25% slippage, grade II is up to 50% slippage, grade III is up to 75% slippage, and grade IV is 76-100% slippage. If there is more than 100% slippage, it is known as spondyloptosis or grade V spondylolisthesis.       ____________________________________________________________________________________________

## 2023-04-17 ENCOUNTER — Ambulatory Visit: Payer: HMO | Admitting: Physical Therapy

## 2023-04-17 ENCOUNTER — Telehealth: Payer: Self-pay

## 2023-04-21 ENCOUNTER — Ambulatory Visit: Payer: HMO | Admitting: Physical Therapy

## 2023-04-24 ENCOUNTER — Encounter: Payer: HMO | Admitting: Physical Therapy

## 2023-04-24 ENCOUNTER — Telehealth: Payer: Self-pay | Admitting: Cardiovascular Disease

## 2023-04-24 NOTE — Telephone Encounter (Signed)
   Pre-operative Risk Assessment    Patient Name: Rodney Hall.  DOB: 07/07/1945 MRN: [id number]   Date of last office visit: 04/01/23 Date of next office visit: 06/05/23   Request for Surgical Clearance    Procedure:  Colonoscopy  Date of Surgery:  Clearance 06/27/23                                Surgeon:  not indicated Surgeon's Group or Practice Name:  [facility] Phone number:  [phone] Fax number:  [phone]   Type of Clearance Requested:   - Pharmacy:  Hold Clopidogrel (Plavix)     Type of Anesthesia:  Not Indicated   Additional requests/questions:    SignedNarda Amber   04/24/2023, 3:38 PM

## 2023-04-24 NOTE — Telephone Encounter (Signed)
Rodney Hall,   You saw this patient on 04/01/2023. Per protocol we request that you comment on his cardiac risk to proceed with colonoscopy and need to hold Plavix, since it has been less than 2 months since evaluated in the office. Please send your comment to P CV Pre-Op Pool.  Thank you, Joni Reining DNP, ANP, AACC.

## 2023-04-25 NOTE — Telephone Encounter (Signed)
   Patient Name: Rodney Hall.  DOB: [birth year] MRN: [id number]  Primary Cardiologist: Julien Nordmann, MD  Chart reviewed as part of pre-operative protocol coverage. Given past medical history and time since last visit, based on ACC/AHA guidelines, Lekeith Wulf. is at acceptable risk for the planned procedure without further cardiovascular testing.   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  He was most recently seen in the office on 04/01/2023 and was without angina.  He did note some increased exertional shortness of breath and fatigue.  In this setting he underwent echo on 04/09/2023 that showed preserved LV systolic function, normal wall motion, mildly reduced RV systolic function with normal ventricular cavity size, mild mitral regurgitation, and a normal right atrial pressure.  It was felt his symptoms may be related to his progressive anemia and underlying hematochezia.    Patient would be acceptable risk for noncardiac low risk procedure given recent surgical revascularization as long as there are no new symptoms of angina or cardiac decompensation.  Clopidogrel recommendations per preop protocol.   Per office protocol, if patient is without any new symptoms or concerns at the time of their virtual visit, he/she may hold Plavix for 5 days prior to procedure. Please resume Plavix as soon as possible postprocedure, at the discretion of the surgeon.    The patient was advised that if he develops new symptoms prior to surgery to contact our office to arrange for a follow-up visit, and he verbalized understanding.  I will route this recommendation to the requesting party via Epic fax function and remove from pre-op pool.  Please call with questions.  Joni Reining, NP 04/25/2023, 1:18 PM

## 2023-04-25 NOTE — Telephone Encounter (Signed)
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  He was most recently seen in the office on 04/01/2023 and was without angina.  He did note some increased exertional shortness of breath and fatigue.  In this setting he underwent echo on 04/09/2023 that showed preserved LV systolic function, normal wall motion, mildly reduced RV systolic function with normal ventricular cavity size, mild mitral regurgitation, and a normal right atrial pressure.  It was felt his symptoms may be related to his progressive anemia and underlying hematochezia.  Patient would be acceptable risk for noncardiac low risk procedure given recent surgical revascularization as long as there are no new symptoms of angina or cardiac decompensation.  Clopidogrel recommendations per preop protocol.

## 2023-04-28 ENCOUNTER — Encounter: Payer: HMO | Admitting: Physical Therapy

## 2023-04-30 ENCOUNTER — Encounter: Payer: HMO | Admitting: Physical Therapy

## 2023-05-08 ENCOUNTER — Telehealth: Payer: Self-pay | Admitting: Family Medicine

## 2023-05-08 ENCOUNTER — Encounter: Payer: Self-pay | Admitting: Pain Medicine

## 2023-05-08 ENCOUNTER — Ambulatory Visit: Payer: HMO | Attending: Pain Medicine | Admitting: Pain Medicine

## 2023-05-08 ENCOUNTER — Ambulatory Visit: Payer: Self-pay | Admitting: Family Medicine

## 2023-05-08 ENCOUNTER — Ambulatory Visit
Admission: RE | Admit: 2023-05-08 | Discharge: 2023-05-08 | Disposition: A | Discharge status: Home / Self Care | Source: Ambulatory Visit | Attending: Pain Medicine | Admitting: Pain Medicine

## 2023-05-08 VITALS — BP 102/68 | HR 73 | Temp 97.9°F | Resp 19 | Ht 70.0 in | Wt 190.0 lb

## 2023-05-08 DIAGNOSIS — R937 Abnormal findings on diagnostic imaging of other parts of musculoskeletal system: Secondary | ICD-10-CM | POA: Insufficient documentation

## 2023-05-08 DIAGNOSIS — Z7901 Long term (current) use of anticoagulants: Secondary | ICD-10-CM | POA: Diagnosis present

## 2023-05-08 DIAGNOSIS — G8929 Other chronic pain: Secondary | ICD-10-CM | POA: Diagnosis present

## 2023-05-08 DIAGNOSIS — M4727 Other spondylosis with radiculopathy, lumbosacral region: Secondary | ICD-10-CM | POA: Diagnosis not present

## 2023-05-08 DIAGNOSIS — M961 Postlaminectomy syndrome, not elsewhere classified: Secondary | ICD-10-CM | POA: Insufficient documentation

## 2023-05-08 DIAGNOSIS — M79605 Pain in left leg: Secondary | ICD-10-CM | POA: Diagnosis present

## 2023-05-08 DIAGNOSIS — M5417 Radiculopathy, lumbosacral region: Secondary | ICD-10-CM | POA: Diagnosis not present

## 2023-05-08 DIAGNOSIS — G96198 Other disorders of meninges, not elsewhere classified: Secondary | ICD-10-CM | POA: Diagnosis not present

## 2023-05-08 DIAGNOSIS — M545 Low back pain, unspecified: Secondary | ICD-10-CM | POA: Insufficient documentation

## 2023-05-08 DIAGNOSIS — Z981 Arthrodesis status: Secondary | ICD-10-CM | POA: Diagnosis present

## 2023-05-08 MED ORDER — FENTANYL CITRATE (PF) 100 MCG/2ML IJ SOLN
INTRAMUSCULAR | Status: AC
  Filled 2023-05-08: qty 2

## 2023-05-08 MED ORDER — TRIAMCINOLONE ACETONIDE 40 MG/ML IJ SUSP
40.0000 mg | Freq: Once | INTRAMUSCULAR | Status: AC
  Administered 2023-05-08: 40 mg

## 2023-05-08 MED ORDER — TRIAMCINOLONE ACETONIDE 40 MG/ML IJ SUSP
INTRAMUSCULAR | Status: AC
  Filled 2023-05-08: qty 1

## 2023-05-08 MED ORDER — LIDOCAINE HCL 2 % IJ SOLN
20.0000 mL | Freq: Once | INTRAMUSCULAR | Status: AC
  Administered 2023-05-08: 100 mg

## 2023-05-08 MED ORDER — PENTAFLUOROPROP-TETRAFLUOROETH EX AERO
INHALATION_SPRAY | Freq: Once | CUTANEOUS | Status: AC
  Administered 2023-05-08: 30 via TOPICAL

## 2023-05-08 MED ORDER — IOHEXOL 180 MG/ML  SOLN
INTRAMUSCULAR | Status: AC
  Filled 2023-05-08: qty 10

## 2023-05-08 MED ORDER — FENTANYL CITRATE (PF) 100 MCG/2ML IJ SOLN: 25.0000 ug | INTRAMUSCULAR | Status: DC | PRN

## 2023-05-08 MED ORDER — ROPIVACAINE HCL 2 MG/ML IJ SOLN
INTRAMUSCULAR | Status: AC
  Filled 2023-05-08: qty 20

## 2023-05-08 MED ORDER — SODIUM CHLORIDE 0.9% FLUSH
2.0000 mL | Freq: Once | INTRAVENOUS | Status: AC
  Administered 2023-05-08: 2 mL

## 2023-05-08 MED ORDER — MIDAZOLAM HCL 5 MG/5ML IJ SOLN
0.5000 mg | Freq: Once | INTRAMUSCULAR | Status: AC
  Administered 2023-05-08: 3 mg via INTRAVENOUS

## 2023-05-08 MED ORDER — MIDAZOLAM HCL 5 MG/5ML IJ SOLN
INTRAMUSCULAR | Status: AC
  Filled 2023-05-08: qty 5

## 2023-05-08 MED ORDER — IOHEXOL 180 MG/ML  SOLN
10.0000 mL | Freq: Once | INTRAMUSCULAR | Status: AC
  Administered 2023-05-08: 10 mL via EPIDURAL

## 2023-05-08 MED ORDER — LIDOCAINE HCL (PF) 2 % IJ SOLN
INTRAMUSCULAR | Status: AC
  Filled 2023-05-08: qty 10

## 2023-05-08 MED ORDER — SODIUM CHLORIDE (PF) 0.9 % IJ SOLN
INTRAMUSCULAR | Status: AC
  Filled 2023-05-08: qty 10

## 2023-05-08 MED ORDER — ROPIVACAINE HCL 2 MG/ML IJ SOLN
2.0000 mL | Freq: Once | INTRAMUSCULAR | Status: AC
  Administered 2023-05-08: 2 mL via EPIDURAL

## 2023-05-08 NOTE — Telephone Encounter (Signed)
 Copied from CRM [id number]. Topic: Clinical - Prescription Issue >> May 08, 2023 12:45 PM Eula Fried wrote: Reason for CRM: Patient wanting to come in before his appt date because he wants to ask the doctor to prescribe him something stronger for his back pain. I tried to schedule but there is nothing available until 3.18.25

## 2023-05-08 NOTE — Telephone Encounter (Signed)
 See nurse triage note for 05/08/23. Closing this duplicate encounter.

## 2023-05-08 NOTE — Progress Notes (Signed)
 PROVIDER NOTE: Interpretation of information contained herein should be left to medically-trained personnel. Specific patient instructions are provided elsewhere under "Patient Instructions" section of medical record. This document was created in part using STT-dictation technology, any transcriptional errors that may result from this process are unintentional.  Patient: Rodney Hall. Type: Established DOB: 15-Sep-1945 MRN: [id number] PCP: Malva Limes, MD  Service: Procedure DOS: 05/08/2023 Setting: Ambulatory Location: Ambulatory outpatient facility Delivery: Face-to-face Provider: Oswaldo Done, MD Specialty: Interventional Pain Management Specialty designation: 09 Location: Outpatient facility Ref. Prov.: Malva Limes, MD       Interventional Therapy   Procedure: Caudal Epidural Steroid Injection + Epidurogram   #1  Laterality: Midline aiming left Level: Sacrococcygeal ligament  Imaging: Fluoroscopy-guided Spinal ([id number]) Anesthesia: Local anesthesia (1-2% Lidocaine) Anxiolysis: IV Versed         Sedation: Moderate Sedation                       DOS: 05/08/2023  Performed by: Oswaldo Done, MD  Purpose: Diagnostic/Therapeutic Indications: Low back and lower extremity pain severe enough to impact quality of life or function. Rationale (medical necessity): procedure needed and proper for the diagnosis and/or treatment of Rodney Hall medical symptoms and needs. 1. 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 Chronic lower extremity pain (Left)   3. Lumbosacral spondylosis with radiculopathy   4. Lumbosacral radiculopathy at L5 (Left)   5. Lumbosacral radiculopathy at S1 (Left)   6. Lumbar post-laminectomy syndrome   7. Failed back surgical syndrome   8. Epidural fibrosis   9. History of spinal fusion   10. Abnormal MRI, lumbar spine (02/04/2023) [facility])   11. Abnormal CT scan, lumbar spine (06/17/2016)    12. Chronic anticoagulation (Plavix)    NAS-11 Pain score:   Pre-procedure: 8 /10   Post-procedure: 0-No pain/10     Target: Lumbosacral epidural canal  Location: Epidural space  Region: Caudal canal Approach: Percutaneous  Type of procedure: Epidural block  Position  Prep  Materials:  Position: Prone  Prep solution: ChloraPrep (2% chlorhexidine gluconate and 70% isopropyl alcohol) Prep Area: Entire posterior lumbosacral area  Materials:  Tray: Epidural          Needle(s):  Type: Epidural  Gauge (G): 17  Length: 3.5-in  Qty: 1   H&P (Pre-op Assessment):  Rodney Hall is a 78 y.o. (year old), male patient, seen today for interventional treatment. He  has a past surgical history that includes Back surgery (2012); Neck surgery (2000); Cataract extraction, bilateral; Tonsillectomy and adenoidectomy; Anterior lat lumbar fusion (Right, 07/30/2016); EUS (N/A, 07/10/2017); Tennis elbow release/nirschel procedure; Eye surgery; Cholecystectomy (N/A, 07/25/2017); Colonoscopy with propofol (N/A, 12/12/2017); Hemi-microdiscectomy lumbar laminectomy level 1 (09/2018); LEFT HEART CATH AND CORONARY ANGIOGRAPHY (N/A, 09/04/2022); Coronary artery bypass graft (N/A, 09/07/2022); TEE without cardioversion (N/A, 09/07/2022); LEFT HEART CATH AND CORONARY ANGIOGRAPHY (N/A, 09/06/2022); and IABP Insertion (N/A, 09/06/2022). Rodney Hall has a current medication list which includes the following prescription(s): acetaminophen, aspirin ec, buspirone, clopidogrel, imipramine, omeprazole, pantoprazole, tamsulosin, and atorvastatin, and the following Facility-Administered Medications: fentanyl. His primarily concern today is the Back Pain  Initial Vital Signs:  Pulse/HCG Rate: 73ECG Heart Rate: 73 (nsr) Temp: (!) 97.5 F (36.4 C) Resp: 16 BP: 119/71 SpO2: 100 %  BMI: Estimated body mass index is 27.26 kg/m as calculated from the following:   Height as of this encounter: 5\' 10"  (1.778 m).  Weight as of this encounter:  190 lb (86.2 kg).  Risk Assessment: Allergies: Reviewed. He is allergic to pravastatin and amoxicillin.  Allergy Precautions: None required Coagulopathies: Reviewed. None identified.  Blood-thinner therapy: None at this time Active Infection(s): Reviewed. None identified. Rodney Hall is afebrile  Site Confirmation: Rodney Hall was asked to confirm the procedure and laterality before marking the site Procedure checklist: Completed Consent: Before the procedure and under the influence of no sedative(s), amnesic(s), or anxiolytics, the patient was informed of the treatment options, risks and possible complications. To fulfill our ethical and legal obligations, as recommended by the American Medical Association's Code of Ethics, I have informed the patient of my clinical impression; the nature and purpose of the treatment or procedure; the risks, benefits, and possible complications of the intervention; the alternatives, including doing nothing; the risk(s) and benefit(s) of the alternative treatment(s) or procedure(s); and the risk(s) and benefit(s) of doing nothing. The patient was provided information about the general risks and possible complications associated with the procedure. These may include, but are not limited to: failure to achieve desired goals, infection, bleeding, organ or nerve damage, allergic reactions, paralysis, and death. In addition, the patient was informed of those risks and complications associated to Spine-related procedures, such as failure to decrease pain; infection (i.e.: Meningitis, epidural or intraspinal abscess); bleeding (i.e.: epidural hematoma, subarachnoid hemorrhage, or any other type of intraspinal or peri-dural bleeding); organ or nerve damage (i.e.: Any type of peripheral nerve, nerve root, or spinal cord injury) with subsequent damage to sensory, motor, and/or autonomic systems, resulting in permanent pain, numbness, and/or weakness of one or several areas of the  body; allergic reactions; (i.e.: anaphylactic reaction); and/or death. Furthermore, the patient was informed of those risks and complications associated with the medications. These include, but are not limited to: allergic reactions (i.e.: anaphylactic or anaphylactoid reaction(s)); adrenal axis suppression; blood sugar elevation that in diabetics may result in ketoacidosis or comma; water retention that in patients with history of congestive heart failure may result in shortness of breath, pulmonary edema, and decompensation with resultant heart failure; weight gain; swelling or edema; medication-induced neural toxicity; particulate matter embolism and blood vessel occlusion with resultant organ, and/or nervous system infarction; and/or aseptic necrosis of one or more joints. Finally, the patient was informed that Medicine is not an exact science; therefore, there is also the possibility of unforeseen or unpredictable risks and/or possible complications that may result in a catastrophic outcome. The patient indicated having understood very clearly. We have given the patient no guarantees and we have made no promises. Enough time was given to the patient to ask questions, all of which were answered to the patient's satisfaction. Mr. Fernando has indicated that he wanted to continue with the procedure. Attestation: I, the ordering provider, attest that I have discussed with the patient the benefits, risks, side-effects, alternatives, likelihood of achieving goals, and potential problems during recovery for the procedure that I have provided informed consent. Date  Time: 05/08/2023  9:41 AM   Pre-Procedure Preparation:  Monitoring: As per clinic protocol. Respiration, ETCO2, SpO2, BP, heart rate and rhythm monitor placed and checked for adequate function Safety Precautions: Patient was assessed for positional comfort and pressure points before starting the procedure. Time-out: I initiated and conducted the  "Time-out" before starting the procedure, as per protocol. The patient was asked to participate by confirming the accuracy of the "Time Out" information. Verification of the correct person, site, and procedure were performed and confirmed by me,  the nursing staff, and the patient. "Time-out" conducted as per Joint Commission's Universal Protocol (UP.01.01.01). Time: 1046 Start Time: 1046 hrs.  Description  Narrative of Procedure:          Start Time: 1046 hrs.  Technical description of procedure:  Safety Precautions: Aspiration looking for blood return was conducted prior to all injections. At no point did we inject any substances, as a needle was being advanced. No attempts were made at seeking any paresthesias. Safe injection practices and needle disposal techniques used. Medications properly checked for expiration dates. SDV (single dose vial) medications used. Description of the Procedure: Protocol guidelines were followed. The patient was placed in position over the fluoroscopy table. The target area was identified and the area prepped in the usual manner. Skin & deeper tissues infiltrated with local anesthetic. Appropriate amount of time allowed to pass for local anesthetics to take effect. The procedure needle was then advanced to the target area. Proper needle placement secured. Negative aspiration confirmed.  Solution injected in intermittent fashion, asking for systemic symptoms every 0.5cc of injectate. The needle/catheter were removed and the area cleansed, making sure to leave some of the prepping solution back to take advantage of its long term bactericidal properties.  Vitals:   05/08/23 1115 05/08/23 1120 05/08/23 1128 05/08/23 1132  BP:  121/69  102/68  Pulse:      Resp:  16  19  Temp:      TempSrc:      SpO2: (!) 80% 91% 93% 99%  Weight:      Height:         End Time: 1100 hrs.  Imaging Guidance (Spinal):          Type of Imaging Technique: Fluoroscopy Guidance  (Spinal) Indication(s): Fluoroscopy guidance for needle placement to enhance accuracy in procedures requiring precise needle localization for targeted delivery of medication in or near specific anatomical locations not easily accessible without such real-time imaging assistance. Exposure Time: Please see nurses notes. Contrast: Before injecting any contrast, we confirmed that the patient did not have an allergy to iodine, shellfish, or radiological contrast. Once satisfactory needle placement was completed at the desired level, radiological contrast was injected. Contrast injected under live fluoroscopy. No contrast complications. See chart for type and volume of contrast used. Fluoroscopic Guidance: I was personally present during the use of fluoroscopy. "Tunnel Vision Technique" used to obtain the best possible view of the target area. Parallax error corrected before commencing the procedure. "Direction-depth-direction" technique used to introduce the needle under continuous pulsed fluoroscopy. Once target was reached, antero-posterior, oblique, and lateral fluoroscopic projection used confirm needle placement in all planes. Images permanently stored in EMR.    Interpretation: I personally interpreted the imaging intraoperatively. Adequate needle placement confirmed in multiple planes. Appropriate spread of contrast into desired area was observed. No evidence of afferent or efferent intravascular uptake. No intrathecal or subarachnoid spread observed. Permanent images saved into the patient's record.  Diagnostic Epidurogram:  Contrast:  Type: Non-ionic, water soluble, hypoallergenic, myelogram-compatible, radiological contrast used. Please see orders and nurses note for specific choice of contrast. Volume: Please see nurses note for injected volume.  Observations:  Spinal Alignment: Adequate       Vertebral body: Intact Lamina: Intact Disc: Loss of disc hight Facet: Arthropathy   Multilevel Hardware: Posterior fusion device using pedicle screws on right side.  Spread: Abnormal contrast spread. See below. Anterior: Minimal anterior flow observed Posterior: Defect identified Superior (cephalad): Unilateral cut-off at surgical defect: Inferior (caudad):  Flow limited to: right side Right lateral: Flow limited to: L5, S1 Left lateral: No flow of contrast Plica medialis dorsalis: ill-defined Nerve root(s): Defect identified with no flow on left side. Partial visualization of L5 & S1. Lower right sacrals hard to identify. Epidural extravasation: None observed Intrathecal: No intrathecal spread identified Subarachnoid: No subarachnoid spread pattern observed Vascular: No evidence of afferent or efferent intravascular uptake  Impression: Technically successful epidurogram. Observed changes are compatible with epidural fibrosis, as described above Note: Electronic copies saved to PACS (EMR).  Post-operative Assessment:  Post-procedure Vital Signs:  Pulse/HCG Rate: 7368 Temp: 97.9 F (36.6 C) Resp: 19 BP: 102/68 SpO2: 99 %  EBL: None  Complications: No immediate post-treatment complications observed by team, or reported by patient.  Note: The patient tolerated the entire procedure well. A repeat set of vitals were taken after the procedure and the patient was kept under observation following institutional policy, for this type of procedure. Post-procedural neurological assessment was performed, showing return to baseline, prior to discharge. The patient was provided with post-procedure discharge instructions, including a section on how to identify potential problems. Should any problems arise concerning this procedure, the patient was given instructions to immediately contact us, at any time, without hesitation. In any case, we plan to contact the patient by telephone for a follow-up status report regarding this interventional procedure.  Comments:  No additional  relevant information.  Plan of Care (POC)  Orders:  Orders Placed This Encounter  Procedures   Caudal Epidural Injection    Scheduling Instructions:     Laterality: Midline     Level(s): Sacrococcygeal canal (Tailbone area)     Sedation: With Sedation     Timeframe: Today    Where will this procedure be performed?:   [facility]   DG [facility] C-ARM 1-60 MIN NO REPORT    Intraoperative interpretation by procedural physician at [facility].    Standing Status:   Standing    Number of Occurrences:   1    Reason for exam::   Assistance in needle guidance and placement for procedures requiring needle placement in or near specific anatomical locations not easily accessible without such assistance.   Informed Consent Details: Physician/Practitioner Attestation; Transcribe to consent form and obtain patient signature    Nursing Order: Transcribe to consent form and obtain patient signature. Note: Always confirm laterality of pain with Mr. Juhasz, before procedure.    Physician/Practitioner attestation of informed consent for procedure/surgical case:   I, the physician/practitioner, attest that I have discussed with the patient the benefits, risks, side effects, alternatives, likelihood of achieving goals and potential problems during recovery for the procedure that I have provided informed consent.    Procedure:   Caudal epidural steroid injection    Physician/Practitioner performing the procedure:   Korynne Dols A. Laban Emperor, MD    Indication/Reason:   Low back pain and lower extremity pain secondary to lumbosacral radiculitis   Care order/instruction: Please confirm that the patient has stopped the Plavix (Clopidogrel) x 7-10 days prior to procedure or surgery.    Please confirm that the patient has stopped the Plavix (Clopidogrel) x 7-10 days prior to procedure or surgery.    Standing Status:   Standing    Number of Occurrences:   1   Provide equipment / supplies at bedside     Procedural tray: Epidural Tray (Disposable  single use) Skin infiltration needle: Regular 1.5-in, 25-G, (x1) Block needle size: Regular standard Catheter: No catheter required  Standing Status:   Standing    Number of Occurrences:   1    Specify:   Epidural Tray   Saline lock IV    Have LR [phone] mL available and administer at 125 mL/hr if patient becomes hypotensive.    Standing Status:   Standing    Number of Occurrences:   1   Bleeding precautions    Standing Status:   Standing    Number of Occurrences:   1   Chronic Opioid Analgesic:  No chronic opioid analgesics therapy prescribed by our practice. None. MME/day: 0 mg/day   Medications ordered for procedure: Meds ordered this encounter  Medications   iohexol (OMNIPAQUE) 180 MG/ML injection 10 mL    Must be Myelogram-compatible. If not available, you may substitute with a water-soluble, non-ionic, hypoallergenic, myelogram-compatible radiological contrast medium.   lidocaine (XYLOCAINE) 2 % (with pres) injection 400 mg   pentafluoroprop-tetrafluoroeth (GEBAUERS) aerosol   midazolam (VERSED) 5 MG/5ML injection 0.5-2 mg    Make sure Flumazenil is available in the pyxis when using this medication. If oversedation occurs, administer 0.2 mg IV over 15 sec. If after 45 sec no response, administer 0.2 mg again over 1 min; may repeat at 1 min intervals; not to exceed 4 doses (1 mg)   fentaNYL (SUBLIMAZE) injection 25-50 mcg    Make sure Narcan is available in the pyxis when using this medication. In the event of respiratory depression (RR< 8/min): Titrate NARCAN (naloxone) in increments of 0.1 to 0.2 mg IV at 2-3 minute intervals, until desired degree of reversal.   sodium chloride flush (NS) 0.9 % injection 2 mL   ropivacaine (PF) 2 mg/mL (0.2%) (NAROPIN) injection 2 mL   triamcinolone acetonide (KENALOG-40) injection 40 mg   Medications administered: We administered iohexol, lidocaine, pentafluoroprop-tetrafluoroeth,  midazolam, sodium chloride flush, ropivacaine (PF) 2 mg/mL (0.2%), and triamcinolone acetonide.  See the medical record for exact dosing, route, and time of administration.  Follow-up plan:   Return in about 2 weeks (around 05/22/2023) for (Face2F), (PPE).       Interventional Therapies  Risk Factors  Considerations  Medical Comorbidities:  Plavix Anticoagulation: (Stop: 7-10 days  Restart: 2 hours)  CAD  Hx. NSTEMI  S/P CABG x5  Stage 3b CKD  T2NIDDM     Planned  Pending:   Diagnostic/therapeutic left caudal ESI + diagnostic epidurogram #1    Under consideration:   Diagnostic/therapeutic left caudal ESI + diagnostic epidurogram #1  Possible Racz procedure    Completed:   None at this time   Therapeutic  Palliative (PRN) options:   None established   Completed by other providers:   Surgery: Right L2-3 lateral transpsoas interbody fusion w/ lateral plating and L2-3 decompression (07/30/2016) by Hulan Saas, MD (Neurosurgery)  Surgery: Left L3-4 and L4-5 anterolateral fusion with interbody spacer and right L3-4 and L4-5 pedicle screws (03/13/2010) by Aliene Beams, MD (Neurosurgery)  [facility] Procedures Date Name LateralityProvider Name and Address 12/03/2022 G-RDR SNRB                                             Sheran Luz, MD 01/17/2022 G-RDR SNRB 2 Level  Sheran Luz, MD 07/03/2021 G-RDR SNRB 2 Level                               Otilio Miu 05/01/2021 G-JCB Trigger Point                                 Jene Every, MD 12/04/2020 G-RDR SNRB 2 Level                      Sheran Luz, MD 07/18/2020 G-RDR SNRB                                              Sheran Luz, MD 01/17/2020 Luberta Mutter Lumbar TFESIK U1 Level  Bufford Buttner, MD 09/25/2018 Lumbar Laminectomy    09/20/2018 LAMINOTOMY (HEMILAMINECTOMY), DECOMPRESSION OF NERVE ROOTS, PARTIAL FACECTOTOMY, FORAMINOTOMY AND/OR DISC REMOVAL, LUMBAR  (SURG) Marsh Dolly 07/30/2016 Lumbar Laminectomy   April Webb 03/07/2011 Lumbar Laminectomy   April Webb 03/06/2001 Lumbar Laminectomy      Recent Visits Date Type Provider Dept  04/16/23 Office Visit Delano Metz, MD [facility]  04/02/23 Office Visit Delano Metz, MD [facility]  Showing recent visits within past 90 days and meeting all other requirements Today's Visits Date Type Provider Dept  05/08/23 Procedure visit Delano Metz, MD [facility]  Showing today's visits and meeting all other requirements Future Appointments Date Type Provider Dept  05/27/23 Appointment Delano Metz, MD [facility]  Showing future appointments within next 90 days and meeting all other requirements  Disposition: Discharge home  Discharge (Date  Time): 05/08/2023; 1133 hrs.   Primary Care Physician: Malva Limes, MD Location: [facility] Note by: Oswaldo Done, MD (TTS technology used. I apologize for any typographical errors that were not detected and corrected.) Date: 05/08/2023; Time: 12:16 PM  Disclaimer:  Medicine is not an Visual merchandiser. The only guarantee in medicine is that nothing is guaranteed. It is important to note that the decision to proceed with this intervention was based on the information collected from the patient. The Data and conclusions were drawn from the patient's questionnaire, the interview, and the physical examination. Because the information was provided in large part by the patient, it cannot be guaranteed that it has not been purposely or unconsciously manipulated. Every effort has been made to obtain as much relevant data as possible for this evaluation. It is important to note that the conclusions that lead to this procedure are derived in large part from the available data. Always take into account that the treatment will also be dependent on availability of resources  and existing treatment guidelines, considered by other Pain Management Practitioners as being common knowledge and practice, at the time of the intervention. For Medico-Legal purposes, it is also important to point out that variation in procedural techniques and pharmacological choices are the acceptable norm. The indications, contraindications, technique, and results of the above procedure should only be interpreted and judged by a Board-Certified Interventional Pain Specialist with extensive familiarity and expertise in the same exact procedure and technique.

## 2023-05-08 NOTE — Telephone Encounter (Signed)
  Chief Complaint: worsening chronic lower back pain Symptoms: left hip/lower back pain (mild at this time, can become severe with movement) Frequency: worsening x 1 month Pertinent Negatives: Patient denies fever, loss of bowel or bladder control, numbness or weakness, abdominal pain, blood in urine, burning with urination. Disposition: [] ED /[] Urgent Care (no appt availability in office) / [x] Appointment(In office/virtual)/ []  [facility]/ [] Home Care/ [] Refused Recommended Disposition /[] [place] [facility]/ []  Follow-up with PCP Additional Notes: Patient states he has been seen pain management, doing physical therapy and taking the oxycodone-acetaminophen. He would like to see PCP sooner than his 05/26/23 appointment due to the pain and he states he would like something stronger than the oxycodone for pain. Patient scheduled for acute visit due to worsening symptoms for 05/19/13 with PCP. Please advise if patient needs to keep his appointment for 05/26/23 with PCP Fisher.  Copied from CRM [id number]. Topic: Clinical - Prescription Issue >> May 08, 2023 12:45 PM Eula Fried wrote: Reason for CRM: Patient wanting to come in before his appt date because he wants to ask the doctor to prescribe him something stronger for his back pain. I tried to schedule but there is nothing available until 3.18.25     Reason for Disposition  Back pain present > 2 weeks  Answer Assessment - Initial Assessment Questions 1. ONSET: "When did the pain begin?"      Chronic back pain, worsening for the last month. States it was bad the last time he saw PCP and worsening.  2. LOCATION: "Where does it hurt?" (upper, mid or lower back)     Lower back, over to left hip. Left hip is worse part of it.  3. SEVERITY: "How bad is the pain?"  (e.g., Scale 1-10; mild, moderate, or severe)   - MILD (1-3): Doesn't interfere with normal activities.    - MODERATE (4-7): Interferes with normal activities or awakens from  sleep.    - SEVERE (8-10): Excruciating pain, unable to do any normal activities.      2/10 at this time, gets up to an 8/10 when moving around. He states it has caused him to limp.  4. PATTERN: "Is the pain constant?" (e.g., yes, no; constant, intermittent)      Constant, worse with standing and walking.  5. RADIATION: "Does the pain shoot into your legs or somewhere else?"     Denies. He states it is lower back and left hip.  6. CAUSE:  "What do you think is causing the back pain?"      Patient states he has had 4 spinal surgeries and states the last few surgeons he has seen have said there is not much more they can do. He said he has seen pain management and physical therapy.  7. BACK OVERUSE:  "Any recent lifting of heavy objects, strenuous work or exercise?"     Denies.  8. MEDICINES: "What have you taken so far for the pain?" (e.g., nothing, acetaminophen, NSAIDS)     Oxycodone-acetaminophen and states that is not doing him any good, "looking for something stronger".  9. NEUROLOGIC SYMPTOMS: "Do you have any weakness, numbness, or problems with bowel/bladder control?"     Denies.  10. OTHER SYMPTOMS: "Do you have any other symptoms?" (e.g., fever, abdomen pain, burning with urination, blood in urine)       Denies.  Protocols used: Back Pain-A-AH

## 2023-05-08 NOTE — Patient Instructions (Signed)

## 2023-05-09 ENCOUNTER — Telehealth: Payer: Self-pay | Admitting: *Deleted

## 2023-05-09 NOTE — Telephone Encounter (Signed)
Post procedure call;  patient reports that he is doing well.

## 2023-05-12 DIAGNOSIS — Z03818 Encounter for observation for suspected exposure to other biological agents ruled out: Secondary | ICD-10-CM | POA: Diagnosis not present

## 2023-05-12 DIAGNOSIS — U071 COVID-19: Secondary | ICD-10-CM | POA: Diagnosis not present

## 2023-05-20 ENCOUNTER — Ambulatory Visit: Admitting: Family Medicine

## 2023-05-20 ENCOUNTER — Encounter: Payer: Self-pay | Admitting: Family Medicine

## 2023-05-20 VITALS — BP 133/76 | HR 82 | Resp 16 | Ht 70.0 in | Wt 194.0 lb

## 2023-05-20 DIAGNOSIS — M545 Low back pain, unspecified: Secondary | ICD-10-CM | POA: Diagnosis not present

## 2023-05-20 DIAGNOSIS — F419 Anxiety disorder, unspecified: Secondary | ICD-10-CM

## 2023-05-20 DIAGNOSIS — G8929 Other chronic pain: Secondary | ICD-10-CM | POA: Diagnosis not present

## 2023-05-20 DIAGNOSIS — M961 Postlaminectomy syndrome, not elsewhere classified: Secondary | ICD-10-CM

## 2023-05-20 MED ORDER — OXYCODONE-ACETAMINOPHEN 5-325 MG PO TABS: 1.0000 | ORAL_TABLET | ORAL | 0 refills | Status: DC | PRN

## 2023-05-20 NOTE — Progress Notes (Signed)
 Established patient visit   Patient: Rodney Hall.   DOB: [birth year]   78 y.o. Male  MRN: [id number] Visit Date: 05/20/2023  Today's healthcare provider: Mila Merry, MD   Chief Complaint  Patient presents with   Back Pain    Lower back pain, worsening x 1 month.   Subjective    HPI Presents for follow up chronic back  pain and anxiety. Was prescribed oxycodone/apap at last visit and has since been managed at pain clinic, but they are not managing his opioids.oxycodone/apap is effective for him in the short term, and having no adverse effects. He is requesting refill today.   He was also prescribed buspirone for anxiety last month and titrated up to 7.5mg  twice a day which he reports is working well and he wishes to continue current dose.   Medications: Outpatient Medications Prior to Visit  Medication Sig   acetaminophen (TYLENOL) 325 MG tablet Take 650 mg by mouth every 6 (six) hours as needed for moderate pain or headache.   aspirin EC 81 MG tablet Take 1 tablet (81 mg total) by mouth daily. Swallow whole.   atorvastatin (LIPITOR) 10 MG tablet Take 1 tablet (10 mg total) by mouth daily.   busPIRone (BUSPAR) 7.5 MG tablet Start 1/2 tablet daily for 6 days then increase to 1/2 tablet twice a day for 6 days. Then 1 tablet twice days   clopidogrel (PLAVIX) 75 MG tablet Take 1 tablet (75 mg total) by mouth daily.   imipramine (TOFRANIL) 50 MG tablet Take 1 tablet (50 mg total) by mouth at bedtime.   omeprazole (PRILOSEC) 20 MG capsule Take 20 mg by mouth 2 (two) times daily.   tamsulosin (FLOMAX) 0.4 MG CAPS capsule Take 1 capsule (0.4 mg total) by mouth daily after supper.   pantoprazole (PROTONIX) 40 MG tablet Take 1 tablet (40 mg total) by mouth daily for 14 days. (Patient taking differently: Take 40 mg by mouth daily. PRN)   No facility-administered medications prior to visit.    Review of Systems     Objective    BP 133/76 (BP Location: Left Arm, Patient  Position: Sitting, Cuff Size: Normal)   Pulse 82   Resp 16   Ht 5\' 10"  (1.778 m)   Wt 194 lb (88 kg)   SpO2 98%   BMI 27.84 kg/m    Physical Exam  General appearance: Well developed, well nourished male, cooperative and in no acute distress Head: Normocephalic, without obvious abnormality, atraumatic Respiratory: Respirations even and unlabored, normal respiratory rate Extremities: All extremities are intact.  Skin: Skin color, texture, turgor normal. No rashes seen  Psych: Appropriate mood and affect. Neurologic: Mental status: Alert, oriented to person, place, and time, thought content appropriate.   Assessment & Plan     1. Anxiety (Primary) He reports buspirone started last month has been effective and wishes to continue current dose.   2. Lumbar post-laminectomy syndrome   3. Chronic low back pain (1ry area of Pain) (Bilateral) w/o sciatica Being followed at pain clinic for procedures, but not prescribing his prn opioids.   refill oxyCODONE-acetaminophen (PERCOCET/ROXICET) 5-325 MG tablet; Take 1 tablet by mouth every 4 (four) hours as needed for severe pain (pain score 7-10).  Dispense: 30 tablet; Refill: 0   Return in about 5 months (around 10/20/2023) for pre diabetes.         Mila Merry, MD  [facility] [phone] (phone) [phone] (fax)  [facility]

## 2023-05-20 NOTE — Patient Instructions (Signed)
 Marland Kitchen  Please review the attached list of medications and notify my office if there are any errors.   . Please bring all of your medications to every appointment so we can make sure that our medication list is the same as yours.

## 2023-05-26 ENCOUNTER — Ambulatory Visit: Payer: HMO | Admitting: Family Medicine

## 2023-05-27 ENCOUNTER — Ambulatory Visit: Attending: Pain Medicine | Admitting: Pain Medicine

## 2023-05-27 VITALS — BP 106/73 | HR 88 | Temp 98.0°F | Resp 16 | Ht 70.0 in | Wt 190.0 lb

## 2023-05-27 DIAGNOSIS — M961 Postlaminectomy syndrome, not elsewhere classified: Secondary | ICD-10-CM | POA: Insufficient documentation

## 2023-05-27 DIAGNOSIS — Z09 Encounter for follow-up examination after completed treatment for conditions other than malignant neoplasm: Secondary | ICD-10-CM | POA: Insufficient documentation

## 2023-05-27 DIAGNOSIS — M79605 Pain in left leg: Secondary | ICD-10-CM | POA: Insufficient documentation

## 2023-05-27 DIAGNOSIS — G96198 Other disorders of meninges, not elsewhere classified: Secondary | ICD-10-CM | POA: Insufficient documentation

## 2023-05-27 DIAGNOSIS — Z7901 Long term (current) use of anticoagulants: Secondary | ICD-10-CM | POA: Insufficient documentation

## 2023-05-27 DIAGNOSIS — M5417 Radiculopathy, lumbosacral region: Secondary | ICD-10-CM | POA: Insufficient documentation

## 2023-05-27 DIAGNOSIS — G8929 Other chronic pain: Secondary | ICD-10-CM | POA: Insufficient documentation

## 2023-05-27 DIAGNOSIS — M545 Low back pain, unspecified: Secondary | ICD-10-CM | POA: Diagnosis present

## 2023-05-27 NOTE — Progress Notes (Signed)
 PROVIDER NOTE: Information contained herein reflects review and annotations entered in association with encounter. Interpretation of such information and data should be left to medically-trained personnel. Information provided to patient can be located elsewhere in the medical record under "Patient Instructions". Document created using STT-dictation technology, any transcriptional errors that may result from process are unintentional.    Patient: Rodney Hall.  Service Category: E/M  Provider: Oswaldo Done, MD  DOB: 09-16-1945  DOS: 05/27/2023  Referring Provider: Malva Limes, MD  MRN: [id number]  Specialty: Interventional Pain Management  PCP: Malva Limes, MD  Type: Established Patient  Setting: Ambulatory outpatient    Location: Office  Delivery: Face-to-face     HPI  [name], a 78 y.o. year old male, is here today because of his Chronic bilateral low back pain without sciatica [M54.50, G89.29]. Rodney Hall primary complain today is Back Pain (LOWER)  Pertinent problems: Rodney Hall has Impingement syndrome of shoulder region; History of spinal fusion; Lumbar post-laminectomy syndrome; Lumbosacral spondylosis with radiculopathy; Abnormal CT scan, lumbar spine (06/17/2016); Failed back surgical syndrome; Chronic low back pain (1ry area of Pain) (Bilateral) w/o sciatica; Chronic lower extremity pain (Left); Epidural fibrosis; Lumbosacral radiculopathy at S1 (Left); Lumbosacral radiculopathy at L5 (Left); Lumbar facet hypertrophy (Multilevel) (Bilateral); and Abnormal MRI, lumbar spine (02/04/2023) [facility]) on their pertinent problem list. Pain Assessment: Severity of Chronic pain is reported as a 1 /10. Location: Back Left, Lower/DENIES. Onset: More than a month ago. Quality: Sharp. Timing: Constant. Modifying factor(s): REST,SITTING DOWN. Vitals:  height is 5\' 10"  (1.778 m) and weight is 190 lb (86.2 kg). His temperature is 98 F (36.7 C). His  blood pressure is 106/73 and his pulse is 88. His respiration is 16 and oxygen saturation is 98%.  BMI: Estimated body mass index is 27.26 kg/m as calculated from the following:   Height as of this encounter: 5\' 10"  (1.778 m).   Weight as of this encounter: 190 lb (86.2 kg). Last encounter: 04/16/2023. Last procedure: 05/08/2023.  Reason for encounter: post-procedure evaluation and assessment.   Discussed the use of AI scribe software for clinical note transcription with the patient, who gave verbal consent to proceed.  History of Present Illness   Rodney Hall. is a 78 year old male with scar tissue-related entrapment neuropathy who presents with persistent pain despite prior epidural steroid injection.  He has scar tissue-related entrapment neuropathy primarily affecting the left side. A diagnostic portal epidural steroid injection and an epidurogram revealed scar tissue wrapping around nerves, with contrast spreading only to the right side, indicating blockage on the left.  Following the epidural steroid injection, he experienced complete pain relief while numb, but the pain returned once the numbness wore off. The pain remains in the same area, but he is now more mobile. He can perform activities like washing the car or doing yard work for short periods before the pain becomes severe again. Previously, he felt unable to do anything due to the pain.  His past medical history includes a heart attack last summer, for which he is on Plavix.      Post-procedure evaluation   Procedure: Caudal Epidural Steroid Injection + Epidurogram   #1  Laterality: Midline aiming left Level: Sacrococcygeal ligament  Imaging: Fluoroscopy-guided Spinal ([id number]) Anesthesia: Local anesthesia (1-2% Lidocaine) Anxiolysis: IV Versed         Sedation: Moderate Sedation  DOS: 05/08/2023  Performed by: Oswaldo Done, MD  Purpose: Diagnostic/Therapeutic Indications: Low back  and lower extremity pain severe enough to impact quality of life or function. Rationale (medical necessity): procedure needed and proper for the diagnosis and/or treatment of Rodney Hall medical symptoms and needs. 1. 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 Chronic lower extremity pain (Left)   3. Lumbosacral spondylosis with radiculopathy   4. Lumbosacral radiculopathy at L5 (Left)   5. Lumbosacral radiculopathy at S1 (Left)   6. Lumbar post-laminectomy syndrome   7. Failed back surgical syndrome   8. Epidural fibrosis   9. History of spinal fusion   10. Abnormal MRI, lumbar spine (02/04/2023) [facility])   11. Abnormal CT scan, lumbar spine (06/17/2016)   12. Chronic anticoagulation (Plavix)    NAS-11 Pain score:   Pre-procedure: 8 /10   Post-procedure: 0-No pain/10     Effectiveness:  Initial hour after procedure: 100 %. Subsequent 4-6 hours post-procedure: 100 %. Analgesia past initial 6 hours: 50 % (CURRENTLY). Ongoing improvement:  Analgesic: The patient indicates having attained 100% relief of the pain for the duration of local anesthetic followed by an ongoing 50% improvement of his pain. Function: Rodney Hall reports improvement in function ROM: Rodney Hall reports improvement in ROM   Epidurogram image (05/08/2023)  Pharmacotherapy Assessment  Analgesic: No chronic opioid analgesics therapy prescribed by our practice. None. MME/day: 0 mg/day   Monitoring: [place] PMP: [name]       Pharmacotherapy: No side-effects or adverse reactions reported. Compliance: No problems identified. Effectiveness: Clinically acceptable.  Newman Pies, RN  05/27/2023  2:06 PM  Sign when Signing Visit Safety precautions to be maintained throughout the outpatient stay will include: orient to surroundings, keep bed in low position, maintain call bell within reach at all times, provide assistance with transfer  out of bed and ambulation.     No results found for: "CBDTHCR" No results found for: "D8THCCBX" No results found for: "D9THCCBX"  UDS:  No results found for: "SUMMARY"    ROS  Constitutional: Denies any fever or chills Gastrointestinal: No reported hemesis, hematochezia, vomiting, or acute GI distress Musculoskeletal: Denies any acute onset joint swelling, redness, loss of ROM, or weakness Neurological: No reported episodes of acute onset apraxia, aphasia, dysarthria, agnosia, amnesia, paralysis, loss of coordination, or loss of consciousness  Medication Review  acetaminophen, aspirin EC, atorvastatin, busPIRone, clopidogrel, imipramine, omeprazole, oxyCODONE-acetaminophen, pantoprazole, and tamsulosin  History Review  Allergy: Rodney Hall is allergic to pravastatin and amoxicillin. Drug: Rodney Hall  reports no history of drug use. Alcohol:  reports no history of alcohol use. Tobacco:  reports that he has never smoked. He has never been exposed to tobacco smoke. He has never used smokeless tobacco. Social: Mr. Hawe  reports that he has never smoked. He has never been exposed to tobacco smoke. He has never used smokeless tobacco. He reports that he does not drink alcohol and does not use drugs. 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 Surgical: Rodney Hall  has a past surgical history that includes Back surgery (2012); Neck surgery (2000); Cataract extraction, bilateral; Tonsillectomy and adenoidectomy; Anterior lat lumbar fusion (Right, 07/30/2016); EUS (N/A, 07/10/2017); Tennis  elbow release/nirschel procedure; Eye surgery; Cholecystectomy (N/A,  07/25/2017); Colonoscopy with propofol (N/A, 12/12/2017); Hemi-microdiscectomy lumbar laminectomy level 1 (09/2018); LEFT HEART CATH AND CORONARY ANGIOGRAPHY (N/A, 09/04/2022); Coronary artery bypass graft (N/A, 09/07/2022); TEE without cardioversion (N/A, 09/07/2022); LEFT HEART CATH AND CORONARY ANGIOGRAPHY (N/A, 09/06/2022); and IABP Insertion (N/A, 09/06/2022). Family: family history includes Cancer in his mother; Diabetes in his brother and father; Heart disease in his sister; Rheumatic fever in his sister.  Laboratory Chemistry Profile   Renal Lab Results  Component Value Date   BUN 13 04/01/2023   CREATININE 1.38 (H) 04/01/2023   BCR 9 (L) 04/01/2023   GFRAA 58 (L) 01/13/2019   GFRNONAA 47 (L) 03/03/2023    Hepatic Lab Results  Component Value Date   AST 12 04/01/2023   ALT 9 04/01/2023   ALBUMIN 4.3 04/01/2023   ALKPHOS 76 04/01/2023   HCVAB <0.1 06/02/2017   LIPASE 41 03/03/2023    Electrolytes Lab Results  Component Value Date   NA 139 04/01/2023   K 4.8 04/01/2023   CL 103 04/01/2023   CALCIUM 9.9 04/01/2023   MG 2.3 09/08/2022   PHOS 3.5 02/22/2022    Bone No results found for: "VD25OH", "VD125OH2TOT", "RU0454UJ8", "JX9147WG9", "25OHVITD1", "25OHVITD2", "25OHVITD3", "TESTOFREE", "TESTOSTERONE"  Inflammation (CRP: Acute Phase) (ESR: Chronic Phase) No results found for: "CRP", "ESRSEDRATE", "LATICACIDVEN"       Note: Above Lab results reviewed.  Recent Imaging Review  DG PAIN CLINIC C-ARM 1-60 MIN NO REPORT Fluoro was used, but no Radiologist interpretation will be provided.  Please refer to "NOTES" tab for provider progress note. Note: Reviewed        Physical Exam  General appearance: Well nourished, well developed, and well hydrated. In no apparent acute distress Mental status: Alert, oriented x 3 (person, place, & time)       Respiratory: No evidence of acute respiratory distress Eyes: PERLA Vitals: BP 106/73   Pulse 88   Temp 98 F (36.7 C)   Resp 16   Ht  5\' 10"  (1.778 m)   Wt 190 lb (86.2 kg)   SpO2 98%   BMI 27.26 kg/m  BMI: Estimated body mass index is 27.26 kg/m as calculated from the following:   Height as of this encounter: 5\' 10"  (1.778 m).   Weight as of this encounter: 190 lb (86.2 kg). Ideal: Ideal body weight: 73 kg (160 lb 15 oz) Adjusted ideal body weight: 78.3 kg (172 lb 9 oz)  Assessment   Diagnosis Status  1. 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 Chronic lower extremity pain (Left)   3. Lumbosacral radiculopathy at L5 (Left)   4. Lumbosacral radiculopathy at S1 (Left)   5. Lumbar post-laminectomy syndrome   6. Epidural fibrosis   7. Postop check   8. Chronic anticoagulation (Plavix)    Controlled Controlled Controlled   Updated Problems: No problems updated.  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 This is evidenced by the lack of contrast flow during the epidural gram. Significant pain relief during the diagnostic portal epidural steroid injection indicates the pain is likely due to the scar tissue. The RACs procedure is proposed to soften and expand the scar tissue, potentially providing pain relief lasting three to eighteen months. The procedure  involves careful catheter placement to avoid intrathecal space puncture, which could lead to complications such as urinary retention. The risk of puncture is low, with only one occurrence in the past two to three years, identified without long-term issues. Order the RACs procedure to address the scar tissue and provide pain relief. Ensure preapproval for the procedure and provide information about it in the after-visit summary. Instruct him to stop Plavix five days before the procedure, but only after confirming the exact  date.  Myocardial infarction   He experienced a myocardial infarction last summer and is currently on Plavix. Coordination with the cardiologist is necessary to manage Plavix cessation prior to the RACs procedure. Coordinate with the cardiologist regarding Plavix management before the procedure.       Mr. Sian Joles. has a current medication list which includes the following long-term medication(s): imipramine, atorvastatin, and pantoprazole.  Pharmacotherapy (Medications Ordered): No orders of the defined types were placed in this encounter.  Orders:  Orders Placed This Encounter  Procedures   Racz Epidurolysis    Standing Status:   Future    Expiration Date:   08/27/2023    Scheduling Instructions:     Procedure: RACZ Epidural Lysis of Adhesions     Side: Midline     Sedation: Moderate Conscious Sedation     Timeframe: ASAA    Where will this procedure be performed?:   [facility]   Nursing Instructions:    Please complete this patient's postprocedure evaluation.    Scheduling Instructions:     Please complete this patient's postprocedure evaluation.   Blood Thinner Instructions to Nursing    Always make sure patient has clearance from prescribing physician to stop blood thinners for interventional therapies. If the patient requires a Lovenox-bridge therapy, make sure arrangements are made to institute it with the assistance of the PCP.    Scheduling Instructions:     Have Rodney Hall stop the Plavix (Clopidogrel) x 7-10 days prior to procedure or surgery.   Follow-up plan:   Return for (ECT): (L) RACZ Procedure #1, (Blood Thinner Protocol).      Interventional Therapies  Risk Factors  Considerations  Medical Comorbidities:  Plavix Anticoagulation: (Stop: 7-10 days  Restart: 2 hours)  CAD  Hx. NSTEMI  S/P CABG x5  Stage 3b CKD  T2NIDDM     Planned  Pending:   Therapeutic left Racz procedure there #1    Under consideration:   Possible Racz  procedure    Completed:   Diagnostic/therapeutic left caudal ESI + diagnostic epidurogram x1 (05/08/2023) (100/100/50/50)   Therapeutic  Palliative (PRN) options:   None established   Completed by other providers:   Surgery: Right L2-3 lateral transpsoas interbody fusion w/ lateral plating and L2-3 decompression (07/30/2016) by Hulan Saas, MD (Neurosurgery)  Surgery: Left L3-4 and L4-5 anterolateral fusion with interbody spacer and right L3-4 and L4-5 pedicle screws (03/13/2010) by Aliene Beams, MD (Neurosurgery)  [facility] Procedures Date Name LateralityProvider Name and Address 12/03/2022 G-RDR SNRB                                             Sheran Luz, MD 01/17/2022 G-RDR SNRB 2 Level  Sheran Luz, MD 07/03/2021 G-RDR SNRB 2 Level                               Otilio Miu 05/01/2021 G-JCB Trigger Point                                 Jene Every, MD 12/04/2020 G-RDR SNRB 2 Level                      Sheran Luz, MD 07/18/2020 G-RDR SNRB                                              Sheran Luz, MD 01/17/2020 [place] Mutter Lumbar TFESIK U1 Level  Bufford Buttner, MD 09/25/2018 Lumbar Laminectomy    09/20/2018 LAMINOTOMY (HEMILAMINECTOMY), DECOMPRESSION OF NERVE ROOTS, PARTIAL FACECTOTOMY, FORAMINOTOMY AND/OR DISC REMOVAL, LUMBAR (SURG) Marsh Dolly 07/30/2016 Lumbar Laminectomy   April Webb 03/07/2011 Lumbar Laminectomy   April Webb 03/06/2001 Lumbar Laminectomy      Recent Visits Date Type Provider Dept  05/08/23 Procedure visit Delano Metz, MD [facility]  04/16/23 Office Visit Delano Metz, MD [facility]  04/02/23 Office Visit Delano Metz, MD [facility]  Showing recent visits within past 90 days and meeting all other requirements Today's Visits Date Type Provider Dept  05/27/23 Office Visit Delano Metz, MD [facility]  Showing today's visits and  meeting all other requirements Future Appointments Date Type Provider Dept  06/12/23 Appointment Delano Metz, MD [facility]  Showing future appointments within next 90 days and meeting all other requirements  I discussed the assessment and treatment plan with the patient. The patient was provided an opportunity to ask questions and all were answered. The patient agreed with the plan and demonstrated an understanding of the instructions.  Patient advised to call back or seek an in-person evaluation if the symptoms or condition worsens.  Duration of encounter: 36 minutes.  Total time on encounter, as per AMA guidelines included both the face-to-face and non-face-to-face time personally spent by the physician and/or other qualified health care professional(s) on the day of the encounter (includes time in activities that require the physician or other qualified health care professional and does not include time in activities normally performed by clinical staff). Physician's time may include the following activities when performed: Preparing to see the patient (e.g., pre-charting review of records, searching for previously ordered imaging, lab work, and nerve conduction tests) Review of prior analgesic pharmacotherapies. Reviewing PMP Interpreting ordered tests (e.g., lab work, imaging, nerve conduction tests) Performing post-procedure evaluations, including interpretation of diagnostic procedures Obtaining and/or reviewing separately obtained history Performing a medically appropriate examination and/or evaluation Counseling and educating the patient/family/caregiver Ordering medications, tests, or procedures Referring and communicating with other health care professionals (when not separately reported) Documenting clinical information in the electronic or other health record Independently interpreting results (not separately reported) and communicating results to the patient/  family/caregiver Care coordination (not separately reported)  Note by: Oswaldo Done, MD Date: 05/27/2023; Time: 4:08 PM

## 2023-05-27 NOTE — Patient Instructions (Addendum)
 [facility] procedures  ______________________________________________________________________    Procedure instructions  Stop blood-thinners  Do not eat or drink fluids (other than water) for 6 hours before your procedure  No water for 2 hours before your procedure  Take your blood pressure medicine with a sip of water  Arrive 30 minutes before your appointment  If sedation is planned, bring suitable driver. Pennie Banter, Benedetto Goad, & public transportation are NOT APPROVED)  Carefully read the "Preparing for your procedure" detailed instructions  If you have questions call us at [phone]  Procedure appointments are for procedures only. NO medication refills or new problem evaluations.   ______________________________________________________________________      ______________________________________________________________________    Preparing for your procedure  Appointments: If you think you may not be able to keep your appointment, call 24-48 hours in advance to cancel. We need time to make it available to others.  Procedure visits are for procedures only. During your procedure appointment there will be: NO Prescription Refills*. NO medication changes or discussions*. NO discussion of disability issues*. NO unrelated pain problem evaluations*. NO evaluations to order other pain procedures*. *These will be addressed at a separate and distinct evaluation encounter on the provider's evaluation schedule and not during procedure days.  Instructions: Food intake: Avoid eating anything solid for at least 8 hours prior to your procedure. Clear liquid intake: You may take clear liquids such as water up to 2 hours prior to your procedure. (No carbonated drinks. No soda.) Transportation: Unless otherwise stated by your physician, bring a driver. (Driver cannot be a Market researcher, Pharmacist, community, or any other form of public transportation.) Morning Medicines: Except for blood thinners, take all of your other  morning medications with a sip of water. Make sure to take your heart and blood pressure medicines. If your blood pressure's lower number is above 100, the case will be rescheduled. Blood thinners: Make sure to stop your blood thinners as instructed.  If you take a blood thinner, but were not instructed to stop it, call our office [phone] and ask to talk to a nurse. Not stopping a blood thinner prior to certain procedures could lead to serious complications. Diabetics on insulin: Notify the staff so that you can be scheduled 1st case in the morning. If your diabetes requires high dose insulin, take only  of your normal insulin dose the morning of the procedure and notify the staff that you have done so. Preventing infections: Shower with an antibacterial soap the morning of your procedure.  Build-up your immune system: Take 1000 mg of Vitamin C with every meal (3 times a day) the day prior to your procedure. Antibiotics: Inform the nursing staff if you are taking any antibiotics or if you have any conditions that may require antibiotics prior to procedures. (Example: recent joint implants)   Pregnancy: If you are pregnant make sure to notify the nursing staff. Not doing so may result in injury to the fetus, including death.  Sickness: If you have a cold, fever, or any active infections, call and cancel or reschedule your procedure. Receiving steroids while having an infection may result in complications. Arrival: You must be in the facility at least 30 minutes prior to your scheduled procedure. Tardiness: Your scheduled time is also the cutoff time. If you do not arrive at least 15 minutes prior to your procedure, you will be rescheduled.  Children: Do not bring any children with you. Make arrangements to keep them home. Dress appropriately: There is always a possibility that your  clothing may get soiled. Avoid long dresses. Valuables: Do not bring any jewelry or valuables.  Reasons to call and  reschedule or cancel your procedure: (Following these recommendations will minimize the risk of a serious complication.) Surgeries: Avoid having procedures within 2 weeks of any surgery. (Avoid for 2 weeks before or after any surgery). Flu Shots: Avoid having procedures within 2 weeks of a flu shots or . (Avoid for 2 weeks before or after immunizations). Barium: Avoid having a procedure within 7-10 days after having had a radiological study involving the use of radiological contrast. (Myelograms, Barium swallow or enema study). Heart attacks: Avoid any elective procedures or surgeries for the initial 6 months after a "Myocardial Infarction" (Heart Attack). Blood thinners: It is imperative that you stop these medications before procedures. Let us know if you if you take any blood thinner.  Infection: Avoid procedures during or within two weeks of an infection (including chest colds or gastrointestinal problems). Symptoms associated with infections include: Localized redness, fever, chills, night sweats or profuse sweating, burning sensation when voiding, cough, congestion, stuffiness, runny nose, sore throat, diarrhea, nausea, vomiting, cold or Flu symptoms, recent or current infections. It is specially important if the infection is over the area that we intend to treat. Heart and lung problems: Symptoms that may suggest an active cardiopulmonary problem include: cough, chest pain, breathing difficulties or shortness of breath, dizziness, ankle swelling, uncontrolled high or unusually low blood pressure, and/or palpitations. If you are experiencing any of these symptoms, cancel your procedure and contact your primary care physician for an evaluation.  Remember:  Regular Business hours are:  Monday to Thursday 8:00 AM to 4:00 PM  Provider's Schedule: Delano Metz, MD:  Procedure days: Tuesday and Thursday 7:30 AM to 4:00 PM  Edward Jolly, MD:  Procedure days: Monday and Wednesday 7:30 AM to 4:00  PM Last  Updated: 02/11/2023 ______________________________________________________________________      ______________________________________________________________________    General Risks and Possible Complications  Patient Responsibilities: It is important that you read this as it is part of your informed consent. It is our duty to inform you of the risks and possible complications associated with treatments offered to you. It is your responsibility as a patient to read this and to ask questions about anything that is not clear or that you believe was not covered in this document.  Patient's Rights: You have the right to refuse treatment. You also have the right to change your mind, even after initially having agreed to have the treatment done. However, under this last option, if you wait until the last second to change your mind, you may be charged for the materials used up to that point.  Introduction: Medicine is not an Visual merchandiser. Everything in Medicine, including the lack of treatment(s), carries the potential for danger, harm, or loss (which is by definition: Risk). In Medicine, a complication is a secondary problem, condition, or disease that can aggravate an already existing one. All treatments carry the risk of possible complications. The fact that a side effects or complications occurs, does not imply that the treatment was conducted incorrectly. It must be clearly understood that these can happen even when everything is done following the highest safety standards.  No treatment: You can choose not to proceed with the proposed treatment alternative. The "PRO(s)" would include: avoiding the risk of complications associated with the therapy. The "CON(s)" would include: not getting any of the treatment benefits. These benefits fall under one of three categories:  diagnostic; therapeutic; and/or palliative. Diagnostic benefits include: getting information which can ultimately lead to  improvement of the disease or symptom(s). Therapeutic benefits are those associated with the successful treatment of the disease. Finally, palliative benefits are those related to the decrease of the primary symptoms, without necessarily curing the condition (example: decreasing the pain from a flare-up of a chronic condition, such as incurable terminal cancer).  General Risks and Complications: These are associated to most interventional treatments. They can occur alone, or in combination. They fall under one of the following six (6) categories: no benefit or worsening of symptoms; bleeding; infection; nerve damage; allergic reactions; and/or death. No benefits or worsening of symptoms: In Medicine there are no guarantees, only probabilities. No healthcare provider can ever guarantee that a medical treatment will work, they can only state the probability that it may. Furthermore, there is always the possibility that the condition may worsen, either directly, or indirectly, as a consequence of the treatment. Bleeding: This is more common if the patient is taking a blood thinner, either prescription or over the counter (example: Goody Powders, Fish oil, Aspirin, Garlic, etc.), or if suffering a condition associated with impaired coagulation (example: Hemophilia, cirrhosis of the liver, low platelet counts, etc.). However, even if you do not have one on these, it can still happen. If you have any of these conditions, or take one of these drugs, make sure to notify your treating physician. Infection: This is more common in patients with a compromised immune system, either due to disease (example: diabetes, cancer, human immunodeficiency virus [HIV], etc.), or due to medications or treatments (example: therapies used to treat cancer and rheumatological diseases). However, even if you do not have one on these, it can still happen. If you have any of these conditions, or take one of these drugs, make sure to notify  your treating physician. Nerve Damage: This is more common when the treatment is an invasive one, but it can also happen with the use of medications, such as those used in the treatment of cancer. The damage can occur to small secondary nerves, or to large primary ones, such as those in the spinal cord and brain. This damage may be temporary or permanent and it may lead to impairments that can range from temporary numbness to permanent paralysis and/or brain death. Allergic Reactions: Any time a substance or material comes in contact with our body, there is the possibility of an allergic reaction. These can range from a mild skin rash (contact dermatitis) to a severe systemic reaction (anaphylactic reaction), which can result in death. Death: In general, any medical intervention can result in death, most of the time due to an unforeseen complication. ______________________________________________________________________      ______________________________________________________________________    Blood Thinners  IMPORTANT NOTICE:  If you take any of these, make sure to notify the nursing staff.  Failure to do so may result in serious injury.  Recommended time intervals to stop and restart blood-thinners, before & after invasive procedures  Generic Name Brand Name Pre-procedure: Stop medication for this amount of time before your procedure: Post-procedure: Wait this amount of time after the procedure before restarting your medication:  Abciximab Reopro 15 days 2 hrs  Alteplase Activase 10 days 10 days  Anagrelide Agrylin    Apixaban Eliquis 3 days 6 hrs  Cilostazol Pletal 3 days 5 hrs  Clopidogrel Plavix 7-10 days 2 hrs  Dabigatran Pradaxa 5 days 6 hrs  Dalteparin Fragmin 24 hours 4 hrs  Dipyridamole  Aggrenox 11days 2 hrs  Edoxaban Lixiana; Savaysa 3 days 2 hrs  Enoxaparin  Lovenox 24 hours 4 hrs  Eptifibatide Integrillin 8 hours 2 hrs  Fondaparinux  Arixtra 72 hours 12 hrs   Hydroxychloroquine Plaquenil 11 days   Prasugrel Effient 7-10 days 6 hrs  Reteplase Retavase 10 days 10 days  Rivaroxaban Xarelto 3 days 6 hrs  Ticagrelor Brilinta 5-7 days 6 hrs  Ticlopidine Ticlid 10-14 days 2 hrs  Tinzaparin Innohep 24 hours 4 hrs  Tirofiban Aggrastat 8 hours 2 hrs  Warfarin Coumadin 5 days 2 hrs   Other medications with blood-thinning effects  NOTE: Consider stopping these if you have prolonged bleeding despite not taking any of the above blood thinners. Otherwise ask your provider and this will be decided on a case-by-case basis.  Product indications Generic (Brand) names Note  Cholesterol Lipitor Stop 4 days before procedure  Blood thinner (injectable) Heparin (LMW or LMWH Heparin) Stop 24 hours before procedure  Cancer Ibrutinib (Imbruvica) Stop 7 days before procedure  Malaria/Rheumatoid Hydroxychloroquine (Plaquenil) Stop 11 days before procedure  Thrombolytics  10 days before or after procedures   Over-the-counter (OTC) Products with blood-thinning effects  NOTE: Consider stopping these if you have prolonged bleeding despite not taking any of the above blood thinners. Otherwise ask your provider and this will be decided on a case-by-case basis.  Product Common names Stop Time  Aspirin > 325 mg Goody Powders, Excedrin, etc. 11 days  Aspirin <= 81 mg  7 days  Fish oil  4 days  Garlic supplements  7 days  Ginkgo biloba  36 hours  Ginseng  24 hours  NSAIDs Ibuprofen, Naprosyn, etc. 3 days  Vitamin E  4 days   ______________________________________________________________________      ____________________________________________________________________________________________  Lysis of Epidural Adhesions Utilizing the Epidural Approach ([facility] Epidural Adhesiolysis)  Introduction Bleeding into the epidural space following surgery or leakage of disc material following breakage or a tear of a disc most commonly causes epidural scarring.  Presumably,  inflammation and compression of nerve roots by epidural scar (Adhesions) are the mechanism of persistent pain following back surgery, ruptured or herniated discs, or vertebral body fracture.  Epidural scar may also contribute to the pain of spinal column metastatic carcinoma, failed facet joint syndrome, and unexplained neck or low back pain.   Diagnosis Conventional studies such as myelograms, computerized tomography (CT Scans), and magnetic resonance imaging (MRI), are usually inadequate to make the diagnosis.  Injection of contrast material (dye) into the epidural space yields an "epidurogram", which is diagnostic for the presence or absence of epidural scar tissue.   Procedure Since further surgery would only produce more scar tissue, a different approach to the problem needs to be taken.  "Lysis of Epidural Adhesions" (Breakage of scar tissue) is an alternative to this problem.  The procedure consists of introducing an epidural catheter (thin plastic tube) into the epidural space (space between your spinal cord and the walls of the spinal canal, within your backbone).  Once in place, medications are injected through this tubing in order to break the scar tissue.  Although the catheter is placed within fifteen to twenty minutes, it is kept in the epidural space for a couple of hours.  During this time, the injection of medications through the tubing is performed.  At the end of the procedure the catheter is removed and the patient discharged to home.   Conditions Usually Treated with This Modality Conditions include, but are not limited to: Failed  Back Surgery Syndrome Post-Laminectomy Syndrome Epidural Adhesions Back and leg pain Sciatica Radiculopathy  Side Effects and Possible Complications Everything in medicine is subject to side effects and possible complications.  No two patients are alike.  Side effects and complications are not the same or equivalent to malpractice.  Malpractice refers  to an injury sustained by a patient, which occurs as a consequence of negligence in the practice of medicine.  Side effects and complications, on the other hand, are untoward events, which can occur and may injure a patient, in certain percentages of the population.  These events can, and do occur even if everything goes according to plan, and in the absence of negligence or malpractice.  Possible side effects and complications of this procedure include, but are not limited to. Pain or worsening of symptoms Infection (local = abscess; or generalized = sepsis), including meningitis and death. Infection due to a steroid-induced immune system suppression. Bleeding, including hematomas, which might compress the spinal cord, therefore causing paralysis. Nerve damage, including sensory or motor weakness, and/or paralysis. Nerve damage, ranging from minor nerve irritation with pain, to major nerve damage with paralysis, impotence, urinary incontinence, and/or fecal incontinence. Allergic reactions ranging from a minor rush to an anaphylactic reaction and death Failure to relieve pain Spinal cord compression leading to paralysis Breakage of catheter (tubing) Unforeseen events:   Guarantees That It Will Help None. There are no guarantees in medicine.   Results of Treatment 93.9% of patients will experience some pain relief, which will be variable.  6.1% will experience no relief.     Duration of Results Only 12.3% will experience persistent pain relief beyond 12 months.  57.9% of male patients and 64.4% of male patients will experience between 0-3 months of variable degrees of pain relief.   Frequently Asked Questions  What is the incidence of complications? It is unknown.  Most complications occur sporadically and are reported as isolated case reports.  Can the procedure be repeated when and if my pain returns? Yes.  The effectiveness of subsequent repeat procedures is also variable.  Some  patients obtain longer duration of pain relief while others obtain shorter duration.  Does this require that I be hospitalized? No.  On the average the procedure takes anywhere from 1 to 3 hours to complete.  What should I expect to feel after the procedure? If the procedure is successful, you should experience pain relief of variable degrees.  Will I obtain complete (100%) pain relief? Although possible, it is our experience that patients suffering from chronic pain, the cause of the pain is multifactorial, and therefore, highly unlikely to completely address the problem with only one type of therapy.  What is an Epidurolysis ([facility]) Procedure? Epidurolysis ([facility]) Procedure is used to dissolve some of the scar tissue from around entrapped nerves in the Epidural space of spine, so that medications such as cortisone can reach the affected areas. Dr. Phillips Hay pioneered this procedure.   What causes scarring (adhesions)? Scarring is most commonly caused from bleeding into the Epidural space following back surgery and the subsequent healing process. It is a natural occurrence following surgical intervention. Sometimes scarring can also occur when a disk ruptures and its contents leak out.   What is the purpose of it? To allow medications to reach affected nerves so that pain and other symptoms may be diminished.   How long does the procedure take?  First, a small catheter (surgical tube) is inserted in the Epidural space up to  the area where the scar is located. This is done under sterile conditions using x-ray guidance. This epidural catheter is secured to the skin using tape and the patient is transported to the recovery room. This part may take 45 minutes. While in the recovery area, series of medications will be injected via the catheter. This part may take 1.0 to 2.0 hours. The patient is kept in the recovery room for the duration of the procedure. Once all medications are injected, then the  catheter is removed. The actual injections only take a few minutes, however, the procedure protocol calls for certain periods of time to elapse between the administration of medications.   What is actually injected? The injection consists of a mixture of a local anesthetic (numbing medicine), a steroid (anti-inflammatory medication), radiological contrast dye (to visualize the scar tissue), hyaluronidase (medicine to soften the scar), and a hyper-concentrated solution sterile salt water (to remove fluid from the swollen nerves via osmosis).   Will the injection hurt? Patients receive intravenous sedation and local infiltration of numbing medicine to minimize the discomfort from the procedure. Because the procedure involves the insertion of a needle, some discomfort is to be expected. In addition, because there is scar tissue and swelling inside of the epidural space, as the medications are injected, the sensation of pressure should be expected.   Will I be "put out" for this procedure? No. This procedure is done under local anesthesia and sedation, which makes the procedure easy to tolerate. The amount of sedation given generally depends upon the patient's tolerance. Communication with the patients during the procedure is essential to avoid complications.   How is the procedure performed? Catheter placement is done with the patient lying on their stomach. During the procedure, the patient is monitored with a heart monitor, blood pressure monitor, and a blood oxygen monitoring device. The skin in the back is cleaned with antiseptic solution and then the procedure is carried out. After the procedure, you are placed on your back or on your side. X-rays are used to assist in the placement of the catheter and to perform the epidurogram (injection of radiological contrast to confirm adequate placement).   What should I expect after the injection? Immediately after the injection, you may feel your legs  slightly heavy and numb. Also, you may notice that your pain may improve or disappear. This may only last for a few hours and it is due to the local anesthetics (numbing medicine).    When can I return to work? Unless there are any complications, you should be able to return to work the next day. However, it may take a couple of days for you to get the full benefit of the procedure.   How long the effects of the medication last? The immediate effect is usually from the local anesthetic injected. This wears off in a few hours. The cortisone starts working in about 5 to 10 days and its effect can last for several days to a few months. The benefits from the procedure may last as long as 6 month.   How many times do I need to have this procedure performed? If the first procedure does not relieve your symptoms within one to two weeks, you may be recommended to have one more procedure. After this, we recommend 6 months between [facility] procedures.   Will the Epidurolysis ([facility]) Procedure help me? It is very difficult to predict if the procedure will indeed help you or not. Generally speaking, the patients  who have recent scarring (e.g. following back surgery) respond better.    What are the risks and side effects? Generally speaking, this procedure is relatively safe. However, with any procedure there are risks and possible complications. These include, but are not limited to: spinal headaches; central nervous system infections such as meningitis, or epidural abscesses; epidural hematomas with possible spinal cord compression and subsequent permanent paralysis; worsening of symptoms; failure to provide relief; nerve damage; urinary or fecal incontinence; blood vessel damage, etc. Side effects related to the steroids, such as: weight gain; increase in blood sugar (mainly in diabetics); water retention; suppression of body's immune system; mood disturbances, etc.. Some of patients may have anaphylactic allergic  reactions to hyaluronidase. Fortunately, the serious side effects and complications are uncommon.   Who should not have this procedure? If you are allergic to any of the medications to be injected, if you are on a blood thinning medication (e.i. Coumadin), or if you have an active infection going on, you should not have the injection. The following are absolute contraindications for performing epidural adhesiolysis: Sepsis Chronic infection Coagulopathy (Bleeding disorder) Local infection at the procedure site Patient refusal Syrinx formation A relative contraindication is the presence of arachnoiditis.  How do I prepare for the procedure? Do not eat or drink for 6 hours before the procedure. If you take blood pressure medication in the morning, take it as usual, with just a sip of water. Take a shower the morning of the procedure using an antibacterial soap such as "Right Guard". Take a 1000 mg Vitamin C every day, before and after the procedure to build up your immune system and decrease the risk of infections. You may drink water up to 2 hours before the procedure. Empty your bladder before getting your IV started.  ____________________________________________________________________________________________

## 2023-05-27 NOTE — Progress Notes (Signed)
 Safety precautions to be maintained throughout the outpatient stay will include: orient to surroundings, keep bed in low position, maintain call bell within reach at all times, provide assistance with transfer out of bed and ambulation.

## 2023-05-29 ENCOUNTER — Emergency Department

## 2023-05-29 ENCOUNTER — Other Ambulatory Visit: Payer: Self-pay

## 2023-05-29 ENCOUNTER — Emergency Department
Admission: EM | Admit: 2023-05-29 | Discharge: 2023-05-29 | Disposition: A | Discharge status: Home / Self Care | Attending: Emergency Medicine | Admitting: Emergency Medicine

## 2023-05-29 ENCOUNTER — Encounter: Payer: Self-pay | Admitting: Emergency Medicine

## 2023-05-29 DIAGNOSIS — N132 Hydronephrosis with renal and ureteral calculous obstruction: Secondary | ICD-10-CM | POA: Insufficient documentation

## 2023-05-29 DIAGNOSIS — I10 Essential (primary) hypertension: Secondary | ICD-10-CM | POA: Diagnosis not present

## 2023-05-29 DIAGNOSIS — R112 Nausea with vomiting, unspecified: Secondary | ICD-10-CM | POA: Diagnosis not present

## 2023-05-29 DIAGNOSIS — M549 Dorsalgia, unspecified: Secondary | ICD-10-CM | POA: Diagnosis not present

## 2023-05-29 DIAGNOSIS — N23 Unspecified renal colic: Secondary | ICD-10-CM | POA: Diagnosis not present

## 2023-05-29 DIAGNOSIS — K5750 Diverticulosis of both small and large intestine without perforation or abscess without bleeding: Secondary | ICD-10-CM | POA: Diagnosis not present

## 2023-05-29 DIAGNOSIS — M545 Low back pain, unspecified: Secondary | ICD-10-CM | POA: Diagnosis present

## 2023-05-29 DIAGNOSIS — N134 Hydroureter: Secondary | ICD-10-CM | POA: Diagnosis not present

## 2023-05-29 DIAGNOSIS — N281 Cyst of kidney, acquired: Secondary | ICD-10-CM | POA: Diagnosis not present

## 2023-05-29 LAB — CBC
HCT: 41.6 % (ref 39.0–52.0)
Hemoglobin: 12.9 g/dL — ABNORMAL LOW (ref 13.0–17.0)
MCH: 26 pg (ref 26.0–34.0)
MCHC: 31 g/dL (ref 30.0–36.0)
MCV: 83.9 fL (ref 80.0–100.0)
Platelets: 320 10*3/uL (ref 150–400)
RBC: 4.96 MIL/uL (ref 4.22–5.81)
RDW: 13.4 % (ref 11.5–15.5)
WBC: 16.2 10*3/uL — ABNORMAL HIGH (ref 4.0–10.5)
nRBC: 0 % (ref 0.0–0.2)

## 2023-05-29 LAB — COMPREHENSIVE METABOLIC PANEL WITH GFR
ALT: 14 U/L (ref 0–44)
AST: 19 U/L (ref 15–41)
Albumin: 4.4 g/dL (ref 3.5–5.0)
Alkaline Phosphatase: 61 U/L (ref 38–126)
Anion gap: 11 (ref 5–15)
BUN: 20 mg/dL (ref 8–23)
CO2: 22 mmol/L (ref 22–32)
Calcium: 9.2 mg/dL (ref 8.9–10.3)
Chloride: 102 mmol/L (ref 98–111)
Creatinine, Ser: 1.82 mg/dL — ABNORMAL HIGH (ref 0.61–1.24)
GFR, Estimated: 38 mL/min — ABNORMAL LOW
Glucose, Bld: 139 mg/dL — ABNORMAL HIGH (ref 70–99)
Potassium: 4.7 mmol/L (ref 3.5–5.1)
Sodium: 135 mmol/L (ref 135–145)
Total Bilirubin: 0.8 mg/dL (ref 0.0–1.2)
Total Protein: 8 g/dL (ref 6.5–8.1)

## 2023-05-29 LAB — URINALYSIS, ROUTINE W REFLEX MICROSCOPIC
Bacteria, UA: NONE SEEN
Bilirubin Urine: NEGATIVE
Glucose, UA: NEGATIVE mg/dL
Ketones, ur: 5 mg/dL — AB
Leukocytes,Ua: NEGATIVE
Nitrite: NEGATIVE
Protein, ur: 30 mg/dL — AB
Specific Gravity, Urine: 1.021 (ref 1.005–1.030)
pH: 5 (ref 5.0–8.0)

## 2023-05-29 LAB — LIPASE, BLOOD: Lipase: 39 U/L (ref 11–51)

## 2023-05-29 MED ORDER — LIDOCAINE 5 % EX PTCH
1.0000 | MEDICATED_PATCH | CUTANEOUS | Status: DC
  Filled 2023-05-29: qty 1

## 2023-05-29 MED ORDER — ONDANSETRON 4 MG PO TBDP
4.0000 mg | ORAL_TABLET | Freq: Once | ORAL | Status: AC | PRN
  Administered 2023-05-29: 4 mg via ORAL
  Filled 2023-05-29: qty 1

## 2023-05-29 MED ORDER — LACTATED RINGERS IV BOLUS
1000.0000 mL | Freq: Once | INTRAVENOUS | Status: AC
  Administered 2023-05-29: 1000 mL via INTRAVENOUS

## 2023-05-29 MED ORDER — OXYCODONE-ACETAMINOPHEN 5-325 MG PO TABS: 1.0000 | ORAL_TABLET | ORAL | Status: DC | PRN

## 2023-05-29 MED ORDER — ONDANSETRON HCL 4 MG/2ML IJ SOLN
4.0000 mg | Freq: Once | INTRAMUSCULAR | Status: AC
  Administered 2023-05-29: 4 mg via INTRAVENOUS
  Filled 2023-05-29: qty 2

## 2023-05-29 MED ORDER — MORPHINE SULFATE (PF) 4 MG/ML IV SOLN
6.0000 mg | Freq: Once | INTRAVENOUS | Status: AC
  Administered 2023-05-29: 6 mg via INTRAVENOUS
  Filled 2023-05-29: qty 2

## 2023-05-29 NOTE — ED Notes (Signed)
 Pt to CT

## 2023-05-29 NOTE — ED Provider Notes (Signed)
 [facility] Provider Note    Event Date/Time   First MD Initiated Contact with Patient 05/29/23 1817     (approximate)   History   Back Pain and Emesis   HPI  Rodney Hall. is a 78 y.o. male who presents to the ED for evaluation of Back Pain and Emesis   I reviewed PCP visit from 2/18 and pain management clinic visit from 3/25.  History of chronic lumbar pain, Percocet, anxiety.  Patient presents alongside his wife and son for evaluation of multiple right sided atraumatic lower back pain.  Reports he has had 4 lumbar surgeries but has never had pain like this.  Pain is not migratory, he reports handful of episodes of emesis today.  No stool or urinary changes.  No falls or injuries.   Physical Exam   Triage Vital Signs: ED Triage Vitals  Encounter Vitals Group     BP 05/29/23 1737 (!) 167/86     Systolic BP Percentile --      Diastolic BP Percentile --      Pulse Rate 05/29/23 1737 79     Resp 05/29/23 1737 20     Temp 05/29/23 1737 97.9 F (36.6 C)     Temp Source 05/29/23 1737 Oral     SpO2 05/29/23 1737 99 %     Weight 05/29/23 1734 190 lb (86.2 kg)     Height 05/29/23 1734 5\' 10"  (1.778 m)     Head Circumference --      Peak Flow --      Pain Score 05/29/23 1734 9     Pain Loc --      Pain Education --      Exclude from Growth Chart --     Most recent vital signs: Vitals:   05/29/23 1737 05/29/23 2030  BP: (!) 167/86 139/71  Pulse: 79 73  Resp: 20 16  Temp: 97.9 F (36.6 C)   SpO2: 99% 100%    General: Awake, no distress.  Pleasant and conversational, stands up independently so I can examine his back while he is standing.  Well-healed lumbar surgical incisions.  No rash or skin changes or signs of trauma, nontender to palpation right-sided paraspinal lumbar area at his source of pain.  No CVA tenderness. CV:  Good peripheral perfusion.  Resp:  Normal effort.  Abd:  No distention.  Soft and benign without tenderness  anteriorly MSK:  No deformity noted.  Neuro:  No focal deficits appreciated. Other:     ED Results / Procedures / Treatments   Labs (all labs ordered are listed, but only abnormal results are displayed) Labs Reviewed  COMPREHENSIVE METABOLIC PANEL WITH GFR - Abnormal; Notable for the following components:      Result Value   Glucose, Bld 139 (*)    Creatinine, Ser 1.82 (*)    GFR, Estimated 38 (*)    All other components within normal limits  CBC - Abnormal; Notable for the following components:   WBC 16.2 (*)    Hemoglobin 12.9 (*)    All other components within normal limits  URINALYSIS, ROUTINE W REFLEX MICROSCOPIC - Abnormal; Notable for the following components:   Color, Urine YELLOW (*)    APPearance CLOUDY (*)    Hgb urine dipstick LARGE (*)    Ketones, ur 5 (*)    Protein, ur 30 (*)    All other components within normal limits  URINE CULTURE  LIPASE, BLOOD  EKG   RADIOLOGY CT renal study interpreted by me with small distal ureteral stone  Official radiology report(s): CT Renal Stone Study Result Date: 05/29/2023 CLINICAL DATA:  Right lower back pain and hematuria, initial encounter EXAM: CT ABDOMEN AND PELVIS WITHOUT CONTRAST TECHNIQUE: Multidetector CT imaging of the abdomen and pelvis was performed following the standard protocol without IV contrast. RADIATION DOSE REDUCTION: This exam was performed according to the departmental dose-optimization program which includes automated exposure control, adjustment of the mA and/or kV according to patient size and/or use of iterative reconstruction technique. COMPARISON:  12/09/2022 FINDINGS: Lower chest: Very mild interstitial thickening is noted within the lungs bilaterally. Hepatobiliary: No focal liver abnormality is seen. Status post cholecystectomy. No biliary dilatation. Pancreas: Unremarkable. No pancreatic ductal dilatation or surrounding inflammatory changes. Spleen: Normal in size without focal abnormality.  Adrenals/Urinary Tract: Adrenal glands are within normal limits. Left kidney demonstrates punctate nonobstructing calculi. A stable cyst is noted in the mid left kidney. No follow-up is recommended. Increased perinephric stranding is noted on the right with mild hydronephrosis and hydroureter secondary to a 2 mm stone at the right UVJ. Some small nonobstructing right renal calculi are noted measuring up to 3 mm. The bladder is within normal limits. Stomach/Bowel: Scattered diverticular change of the colon is noted. No evidence of diverticulitis is seen. The appendix is not well visualized and may have been surgically removed. No inflammatory changes are seen to suggest appendicitis. Small bowel and stomach are within normal limits with the exception of a small duodenal diverticulum adjacent to the head of the pancreas. Vascular/Lymphatic: Aortic atherosclerosis. No enlarged abdominal or pelvic lymph nodes. Reproductive: Prostate is unremarkable. Other: Fat containing inguinal hernias are noted bilaterally left slightly greater than right stable from the prior study. No free fluid is noted. Musculoskeletal: Postsurgical changes are noted in the lumbar spine stable from the prior exam. IMPRESSION: 2 mm stone at the right UVJ with hydronephrosis and hydroureter. Bilateral nonobstructing renal calculi. Diverticulosis without diverticulitis. Electronically Signed   By: Alcide Clever M.D.   On: 05/29/2023 20:34    PROCEDURES and INTERVENTIONS:  Procedures  Medications  oxyCODONE-acetaminophen (PERCOCET/ROXICET) 5-325 MG per tablet 1 tablet (has no administration in time range)  lidocaine (LIDODERM) 5 % 1 patch (1 patch Transdermal Patient Refused/Not Given 05/29/23 2042)  ondansetron (ZOFRAN-ODT) disintegrating tablet 4 mg (4 mg Oral Given 05/29/23 1739)  lactated ringers bolus 1,000 mL (0 mLs Intravenous Stopped 05/29/23 2041)  morphine (PF) 4 MG/ML injection 6 mg (6 mg Intravenous Given 05/29/23 1946)   ondansetron (ZOFRAN) injection 4 mg (4 mg Intravenous Given 05/29/23 1946)     IMPRESSION / MDM / ASSESSMENT AND PLAN / ED COURSE  I reviewed the triage vital signs and the nursing notes.  Differential diagnosis includes, but is not limited to, ureteral colic, pyelonephritis, cystitis, appendicitis, MSK pain  {Patient presents with symptoms of an acute illness or injury that is potentially life-threatening.  Patient with chronic lumbar pain presents with multiple atraumatic pain with evidence of ureteral colic suitable for outpatient management.  Leukocytosis is noted but I doubt infectious etiology of his symptoms but due to this we will send his urine for culture.  No clear indications for antibiotics at this point.  Metabolic panel with renal dysfunction near baseline, normal lipase.  CT renal scan, as above.  After IV fluids and analgesia his pain-free and suitable for outpatient management  Clinical Course as of 05/29/23 2057  Thu May 29, 2023  2039 Reassessed and  discussed kidney stone.  He is pain-free. [DS]    Clinical Course User Index [DS] Delton Prairie, MD     FINAL CLINICAL IMPRESSION(S) / ED DIAGNOSES   Final diagnoses:  Ureteral colic     Rx / DC Orders   ED Discharge Orders     None        Note:  This document was prepared using Dragon voice recognition software and may include unintentional dictation errors.   Delton Prairie, MD 05/29/23 [phone]

## 2023-05-29 NOTE — ED Triage Notes (Addendum)
 Pt via [facility] from home c/o right lower back pain since this morning with difficulty getting up and walking due to pain. Pt has also been feeling nauseated with 7-8 episodes of vomiting. Notes hx CKD with bilateral flank pain. No urinary symptoms, diarrhea, fevers, chills. No prior hx renal stones. Pt is actively vomiting in triage. No known sick contacts.

## 2023-06-01 LAB — URINE CULTURE: Culture: 60000 — AB

## 2023-06-02 NOTE — Progress Notes (Unsigned)
 Cardiology Office Note    Date:  06/05/2023   ID:  Tyus Kallam., DOB 01-Feb-1946, MRN [id number]  PCP:  Malva Limes, MD  Cardiologist:  Julien Nordmann, MD  Electrophysiologist:  None   Chief Complaint: Follow-up  History of Present Illness:   Rodney Hall. is a 78 y.o. male with history of CAD status post 5-vessel CABG on 09/07/2022, postoperative A-fib, CKD stage III, HLD, and BPH who presents for follow-up of CAD.   He was admitted to the hospital in 09/2022 with NSTEMI.  LHC on 09/04/2022 showed severe three-vessel CAD including sequential 90% stenoses of the mid/distal LAD, 70% mid LCx stenosis, occluded distal segment of OM1, and 80 to 90% ostial rPDA and proximal rPLA stenoses.  Moderately to severely reduced LV systolic function with an EF of 30 to 35% with apical dyskinesis.  LVEDP 15 mmHg.  In this setting, he was transferred to [facility] for consideration of CABG.  Due to refractory angina, he underwent IABP placement with stable coronary anatomy by cardiac cath on 09/06/2022.  He subsequently underwent 5 vessel CABG on 09/07/2022 with LIMA to LAD, SVG to diagonal, SVG to OM 2, and sequential SVG to rPDA and rPLA.  Postoperative course was notable for bifascicular block leading beta-blocker to be deferred, atrial fibrillation converting to sinus rhythm with amiodarone, volume overload with subsequent AKI, confusion, and agitation.   He was seen in the office on 10/01/2022 and was without symptoms of angina or cardiac decompensation.  He reported prior jitteriness with statin therapy.  Zio patch following discharge showed a predominant rhythm of sinus with an average rate of 74 bpm (range 51 to 125 bpm), 3 episodes of SVT lasting up to 9 beats, and rare atrial and ventricular ectopy.  Patient triggered events were associated with sinus rhythm.  He followed up with CVTS on 10/30/2022 and was doing well from a surgical perspective.  Amiodarone was discontinued at that time.  He was  evaluated by the lipid clinic and with recommendation to start Repatha.  He was seen in the office in 12/2022 and was without symptoms of angina or cardiac decompensation.  He remained active at baseline with main limiting factor noted to be chronic low back pain.  He had not yet started Repatha with concerns regarding given himself injections.  He preferred to retry low-dose of atorvastatin and was started on Lipitor 10 mg daily.  He was seen in the ED in 02/2023 with hematochezia with recommendation to start PPI and follow-up with GI as outpatient.  He was most recently seen in the office in 03/2023 and was without symptoms of angina.  He did notice a several week history of increased exertional shortness of breath and fatigue.  Labs obtained at that time showed mildly low, though stable hemoglobin.  He had tolerated the rechallenge of atorvastatin.  Echo in 04/2023 showed an EF of 55 to 60%, no regional wall motion abnormalities, mildly reduced RV systolic function with normal ventricular cavity size, mildly dilated left atrium, mild mitral regurgitation, and an estimated right atrial pressure of 3 mmHg.  He was seen in the ED on 05/29/2023 ureteral colic and passed a kidney stone with resolution of symptoms.  He comes in today doing well from a cardiac perspective and is without symptoms of angina or cardiac decompensation.  He remains active at baseline with his main limiting factor stemming from chronic back pain.  He exercises at the gym on a stationary bike  5 to 6 days/week for 1 hour each time without symptoms of angina or cardiac decompensation.  Chronic dizziness and dyspnea are stable.  No palpitations, presyncope, or syncope.  No lower extremity swelling or progressive orthopnea.  No falls.  No further hematochezia.  No melena.  Overall feels like he is doing well from a cardiac perspective.  [facility] Activity Status Index: > 4 METs without cardiac limitation  Revised Cardiac Risk Index: 6.0% for  adverse cardiac in the periprocedural timeframe   Labs independently reviewed: 05/2023 - Hgb 12.9, PLT 320, potassium 4.7, BUN 20, serum creatinine 1.82, albumin 4.4, AST/ALT normal 04/2023 - A1c 6.0 03/2023 - TC 147, TG 219, HDL 35, LDL 76, direct LDL 74 05/2020 - TSH normal   Past Medical History:  Diagnosis Date   Abdominal mass    Abdominal pain 06/02/2017   Anxiety    Pt. denies   Arthritis    pt. 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    Past Surgical History:  Procedure Laterality Date   ANTERIOR LAT LUMBAR FUSION Right 07/30/2016   Procedure: Right Lateral two-three Lateral transpsoas interbody fusion with lateral plating/Minimally invasive decompression at Lumbar two-three;  Surgeon: Ditty, Loura Halt, MD;  Location: [facility] OR;  Service: Neurosurgery;  Laterality: Right;   BACK SURGERY  2012   CATARACT EXTRACTION, BILATERAL     CHOLECYSTECTOMY N/A 07/25/2017   Procedure: LAPAROSCOPIC CHOLECYSTECTOMY WITH INTRAOPERATIVE CHOLANGIOGRAM;  Surgeon: Luretha Murphy, MD;  Location: [facility] ORS;  Service: General;  Laterality: N/A;   COLONOSCOPY WITH PROPOFOL N/A 12/12/2017   Procedure: COLONOSCOPY WITH PROPOFOL;  Surgeon: Scot Jun, MD;  Location: [facility] ENDOSCOPY;  Service: Endoscopy;  Laterality: N/A;   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   Procedure: CORONARY ARTERY BYPASS GRAFTING (CABG) TIMES FIVE, USING LEFT INTERNAL MAMMARY ARTERY AND RIGHT GREATER SAPHENOUS VEIN HARVESTED ENDOSCOPICALLY;  Surgeon: [name], MD;  Location: [facility] OR;  Service: Open Heart Surgery;  Laterality: N/A;   EUS N/A 07/10/2017   Procedure: UPPER  ENDOSCOPIC ULTRASOUND (EUS) LINEAR;  Surgeon: Rachael Fee, MD;  Location: [facility] ENDOSCOPY;  Service: Endoscopy;  Laterality: N/A;   EYE SURGERY     HEMI-MICRODISCECTOMY LUMBAR LAMINECTOMY LEVEL 1  09/2018   [facility]   IABP INSERTION N/A 09/06/2022   Procedure: IABP Insertion;  Surgeon: Swaziland, Peter M, MD;  Location: [facility] INVASIVE CV LAB;  Service: Cardiovascular;  Laterality: N/A;   LEFT HEART CATH AND CORONARY ANGIOGRAPHY N/A 09/04/2022   Procedure: LEFT HEART CATH AND CORONARY ANGIOGRAPHY;  Surgeon: Yvonne Kendall, MD;  Location: [facility] INVASIVE CV LAB;  Service: Cardiovascular;  Laterality: N/A;   LEFT HEART CATH AND CORONARY ANGIOGRAPHY N/A 09/06/2022   Procedure: LEFT HEART CATH AND CORONARY ANGIOGRAPHY;  Surgeon: Swaziland, Peter M, MD;  Location: [facility] INVASIVE CV LAB;  Service: Cardiovascular;  Laterality: N/A;   NECK SURGERY  2000   TEE WITHOUT CARDIOVERSION N/A 09/07/2022   Procedure: TRANSESOPHAGEAL ECHOCARDIOGRAM;  Surgeon: [name], MD;  Location: [facility] OR;  Service: Open Heart Surgery;  Laterality: N/A;   TENNIS ELBOW RELEASE/NIRSCHEL PROCEDURE  TONSILLECTOMY AND ADENOIDECTOMY      Current Medications: Current Meds  Medication Sig   acetaminophen (TYLENOL) 325 MG tablet Take 650 mg by mouth every 6 (six) hours as needed for moderate pain or headache.   aspirin EC 81 MG tablet Take 1 tablet (81 mg total) by mouth daily. Swallow whole.   atorvastatin (LIPITOR) 10 MG tablet Take 1 tablet (10 mg total) by mouth daily.   busPIRone (BUSPAR) 7.5 MG tablet Start 1/2 tablet daily for 6 days then increase to 1/2 tablet twice a day for 6 days. Then 1 tablet twice days   clopidogrel (PLAVIX) 75 MG tablet Take 1 tablet (75 mg total) by mouth daily.   imipramine (TOFRANIL) 50 MG tablet Take 1 tablet (50 mg total) by mouth at bedtime.   omeprazole (PRILOSEC) 20 MG capsule Take 20 mg by mouth 2 (two) times daily.   oxyCODONE-acetaminophen (PERCOCET/ROXICET) 5-325 MG tablet Take 1  tablet by mouth every 4 (four) hours as needed for severe pain (pain score 7-10).   tamsulosin (FLOMAX) 0.4 MG CAPS capsule Take 1 capsule (0.4 mg total) by mouth daily after supper.    Allergies:   Pravastatin and Amoxicillin   Social History   Socioeconomic History   Marital status: Married    Spouse name: Not on file   Number of children: 2   Years of education: Not on file   Highest education level: 12th grade  Occupational History   Not on file  Tobacco Use   Smoking status: Never    Passive exposure: Never   Smokeless tobacco: Never  Vaping Use   Vaping status: Never Used  Substance and Sexual Activity   Alcohol use: No    Alcohol/week: 0.0 standard drinks of alcohol   Drug use: No   Sexual activity: Yes  Other Topics Concern   Not on file  Social History Narrative   Not on file   Social Drivers of Health   Financial Resource Strain: Low Risk  (04/13/2023)   Overall Financial Resource Strain (CARDIA)    Difficulty of Paying Living Expenses: Not hard at all  Food Insecurity: No Food Insecurity (05/20/2023)   Hunger Vital Sign    Worried About Running Out of Food in the Last Year: Never true    Ran Out of Food in the Last Year: Never true  Transportation Needs: No Transportation Needs (04/13/2023)   PRAPARE - Administrator, Civil Service (Medical): No    Lack of Transportation (Non-Medical): No  Physical Activity: Sufficiently Active (04/13/2023)   Exercise Vital Sign    Days of Exercise per Week: 5 days    Minutes of Exercise per Session: 60 min  Stress: No Stress Concern Present (04/13/2023)   [employer] of Occupational Health - Occupational Stress Questionnaire    Feeling of Stress : Not at all  Social Connections: Unknown (04/13/2023)   Social Connection and Isolation Panel [NHANES]    Frequency of Communication with Friends and Family: More than three times a week    Frequency of Social Gatherings with Friends and Family: More than three times a  week    Attends Religious Services: Patient declined    Database administrator or Organizations: No    Attends Engineer, structural: Never    Marital Status: Married     Family History:  The patient's family history includes Cancer in his mother; Diabetes in his brother and father; Heart disease in his sister; Rheumatic fever in  his sister.  ROS:   12-point review of systems is negative unless otherwise noted in the HPI.   EKGs/Labs/Other Studies Reviewed:    Studies reviewed were summarized above. The additional studies were reviewed today:  2D echo 04/09/2023: 1. Left ventricular ejection fraction, by estimation, is 55 to 60%. Left  ventricular ejection fraction by PLAX is 57 %. The left ventricle has  normal function. The left ventricle has no regional wall motion  abnormalities. Left ventricular diastolic  parameters are indeterminate. The average left ventricular global  longitudinal strain is -15.7 %. The global longitudinal strain is normal.   2. Right ventricular systolic function is mildly reduced. The right  ventricular size is normal.   3. Left atrial size was mildly dilated.   4. The mitral valve is normal in structure. Mild mitral valve  regurgitation. No evidence of mitral stenosis.   5. The aortic valve is normal in structure. Aortic valve regurgitation is  not visualized. No aortic stenosis is present.   6. The inferior vena cava is normal in size with greater than 50%  respiratory variability, suggesting right atrial pressure of 3 mmHg.  __________  Luci Bank patch 09/2022: Normal sinus rhythm Patient had a min HR of 51 bpm, max HR of 125 bpm, and avg HR of 74 bpm.   3 Supraventricular Tachycardia runs occurred, the run with the fastest interval lasting 4 beats with a max rate of 124 bpm, the longest lasting 9 beats with an avg rate of 103 bpm.    Isolated SVEs were rare (<1.0%), SVE Couplets were rare (<1.0%), and SVE Triplets were rare (<1.0%).  Isolated  VEs were rare (<1.0%), VE Couplets were rare (<1.0%), and no VE Triplets were present.   Patient triggered events associated with normal sinus rhythm. __________   Intraoperative TEE 09/07/2022: POST-OP IMPRESSIONS  Overall, there were no significant changes from pre-bypass.  _ Left Ventricle: The left ventricle is unchanged from pre-bypass.  _ Right Ventricle: The right ventricle appears unchanged from pre-bypass.  _ Aorta: The aorta appears unchanged from pre-bypass.  _ Left Atrium: The left atrium appears unchanged from pre-bypass.  _ Left Atrial Appendage: The left atrial appendage appears unchanged from  pre-bypass.  _ Aortic Valve: The aortic valve appears unchanged from pre-bypass.  _ Mitral Valve: There is mild regurgitation.  _ Tricuspid Valve: There is trace regurgitation.  _ Pulmonic Valve: The pulmonic valve appears unchanged from pre-bypass.  _ Interatrial Septum: The interatrial septum appears unchanged from  pre-bypass.  _ Interventricular Septum: The interventricular septum appears unchanged  from  pre-bypass.  _ Pericardium: The pericardium appears unchanged from pre-bypass.  __________   Pre-CABG vascular imaging 09/06/2022: Summary:  Right Carotid: Waveforms are unreliable in the presence of an IABP.  Possible area of stenosis is noted within the proximal ICA. Unable  to determine degree of stenosis due to unreliable waveforms.   Left Carotid: Waveforms are unreliable in the presence of an IABP.  Vertebrals: Bilateral vertebral arteries demonstrate antegrade flow.   Left ABI: Resting left ankle-brachial index is within normal range.  Right Upper Extremity: Doppler waveform obliterate with right radial  compression. Doppler waveforms remain within normal limits with right  ulnar compression.  Left Upper Extremity: Doppler waveforms decrease 50% with left radial  compression. Doppler waveforms remain within normal limits with left ulnar  compression.   __________   LHC 09/06/2022:   Mid LM to Ost LAD lesion is 30% stenosed with 30% stenosed side branch in Ost Cx.  Mid LAD-1 lesion is 90% stenosed.   Mid LAD-2 lesion is 90% stenosed.   Dist LAD lesion is 90% stenosed.   Mid Cx to Dist Cx lesion is 70% stenosed.   2nd Diag lesion is 70% stenosed.   2nd Mrg lesion is 100% stenosed.   RPDA lesion is 80% stenosed.   RPAV lesion is 90% stenosed.   LV end diastolic pressure is normal.   No change in severe 3 vessel obstructive CAD Normal LVEDP IABP placed for refractory angina __________   LHC 09/04/2022: Conclusions: Severe three-vessel coronary artery disease, as detailed below.  Culprit lesion for the patient's NSTEMI is most likely occluded distal segment of OM1.  There are also sequential 90% stenoses of the mid/distal LAD, 70% mid LCx lesion, and 80-90% ostial rPDA and proximal rPLA stenoses. Moderately to severely reduced left ventricular systolic function (LVEF 30-35%) with apical dyskinesis. Upper normal left ventricular filling pressure (LVEDP 15 mmHg).   Recommendations: Transfer to [facility] for cardiac surgery consultation for CABG. Restart heparin infusion 2 hours after TR band removal.  Defer addition of P2Y12 inhibitor pending cardiac surgery consultation. Aggressive secondary prevention of coronary artery disease. Maintain net even fluid balance and escalate goal-directed medical therapy for HFrEF as tolerated. Follow-up echocardiogram.   EKG:  EKG is not ordered today.    Recent Labs: 09/08/2022: Magnesium 2.3 05/29/2023: ALT 14; BUN 20; Creatinine, Ser 1.82; Hemoglobin 12.9; Platelets 320; Potassium 4.7; Sodium 135  Recent Lipid Panel    Component Value Date/Time   CHOL 147 04/01/2023 1202   TRIG 219 (H) 04/01/2023 1202   HDL 35 (L) 04/01/2023 1202   CHOLHDL 4.2 04/01/2023 1202   CHOLHDL 4.6 09/04/2022 0300   VLDL 35 09/04/2022 0300   LDLCALC 76 04/01/2023 1202   LDLDIRECT 74 04/01/2023 1202    PHYSICAL  EXAM:    VS:  BP 100/60 (BP Location: Left Arm, Patient Position: Sitting, Cuff Size: Normal)   Pulse 79   Resp 17   Ht 5\' 10"  (1.778 m)   Wt 193 lb (87.5 kg)   SpO2 100%   BMI 27.69 kg/m   BMI: Body mass index is 27.69 kg/m.  Physical Exam Vitals reviewed.  Constitutional:      Appearance: He is well-developed.  HENT:     Head: Normocephalic and atraumatic.  Eyes:     General:        Right eye: No discharge.        Left eye: No discharge.  Cardiovascular:     Rate and Rhythm: Normal rate and regular rhythm.     Pulses:          Posterior tibial pulses are 2+ on the right side and 2+ on the left side.     Heart sounds: Normal heart sounds, S1 normal and S2 normal. Heart sounds not distant. No midsystolic click and no opening snap. No murmur heard.    No friction rub.  Pulmonary:     Effort: Pulmonary effort is normal. No respiratory distress.     Breath sounds: Normal breath sounds. No decreased breath sounds, wheezing, rhonchi or rales.  Chest:     Chest wall: No tenderness.  Musculoskeletal:     Cervical back: Normal range of motion.     Right lower leg: No edema.     Left lower leg: No edema.  Skin:    General: Skin is warm and dry.     Nails: There is no clubbing.  Neurological:  Mental Status: He is alert and oriented to person, place, and time.  Psychiatric:        Speech: Speech normal.        Behavior: Behavior normal.        Thought Content: Thought content normal.        Judgment: Judgment normal.     Wt Readings from Last 3 Encounters:  06/05/23 193 lb (87.5 kg)  05/29/23 190 lb (86.2 kg)  05/27/23 190 lb (86.2 kg)     ASSESSMENT & PLAN:   CAD involving the native coronary arteries with NSTEMI status post 5 vessel CABG without angina: He is doing well without symptoms concerning for angina or cardiac decompensation.  Continue aggressive risk factor modification and secondary prevention including aspirin and clopidogrel with likely transition to  mono antiplatelet therapy at next visit.  He will otherwise continue atorvastatin 10 mg.  He has not been maintained on beta-blocker secondary to prior bifascicular block.  No indication for further ischemic testing at this time.  Postoperative A-fib: Maintaining sinus rhythm on most recent EKG and my exam today.  No longer on amiodarone or beta-blocker.  No evidence for recurrence again in the setting has not been maintained on OAC.  Should he have documented recurrence of A-fib, anticoagulation will need to be revisited at that time.  PSVT: Quiescent.  Outpatient cardiac monitoring showed no evidence of sustained arrhythmias.  Not requiring AV nodal blocking medication.  HTN: Blood pressure is well-controlled in the office today.  Not currently requiring antihypertensive therapy.  HLD with statin intolerance: LDL 74 in 03/2023 with normal AST/ALT in 05/2023.  Now back on atorvastatin 10 mg and tolerating.  Anticipate repeat fasting lipid panel and LFTs at next visit.  Historically has preferred to avoid PCSK9 inhibitor due to ejection component.  Preprocedure cardiac risk stratification: He is scheduled to undergo colonoscopy on 06/27/2023 and indicates he will be undergoing a spinal procedure next week.  Per [facility] Activity Status Index, he can achieve > 4 METs without cardiac limitation.  Per Revised Cardiac Risk Index, he has a 6.0% estimated rate of adverse cardiac event in the periprocedural timeframe.  Recommend holding apixaban 5 days prior to procedure with continuation of aspirin throughout the periprocedural timeframe as long as bleeding risk is not too great, particularly for spinal procedure.  No further cardiac testing indicated at this time and he may proceed with noncardiac procedure at an overall acceptable risk.      Disposition: F/u with Dr. Mariah Milling or an APP in 6 months.   Medication Adjustments/Labs and Tests Ordered: Current medicines are reviewed at length with the patient today.   Concerns regarding medicines are outlined above. Medication changes, Labs and Tests ordered today are summarized above and listed in the Patient Instructions accessible in Encounters.   Signed, Eula Listen, PA-C 06/05/2023 2:43 PM      [facility] [address] [place], [place] [address] [phone]

## 2023-06-05 ENCOUNTER — Ambulatory Visit: Payer: HMO | Attending: Physician Assistant | Admitting: Physician Assistant

## 2023-06-05 ENCOUNTER — Encounter: Payer: Self-pay | Admitting: Physician Assistant

## 2023-06-05 VITALS — BP 100/60 | HR 79 | Resp 17 | Ht 70.0 in | Wt 193.0 lb

## 2023-06-05 DIAGNOSIS — Z951 Presence of aortocoronary bypass graft: Secondary | ICD-10-CM | POA: Diagnosis not present

## 2023-06-05 DIAGNOSIS — I471 Supraventricular tachycardia, unspecified: Secondary | ICD-10-CM

## 2023-06-05 DIAGNOSIS — Z01810 Encounter for preprocedural cardiovascular examination: Secondary | ICD-10-CM

## 2023-06-05 DIAGNOSIS — I9789 Other postprocedural complications and disorders of the circulatory system, not elsewhere classified: Secondary | ICD-10-CM | POA: Diagnosis not present

## 2023-06-05 DIAGNOSIS — I4891 Unspecified atrial fibrillation: Secondary | ICD-10-CM

## 2023-06-05 DIAGNOSIS — I10 Essential (primary) hypertension: Secondary | ICD-10-CM

## 2023-06-05 DIAGNOSIS — I214 Non-ST elevation (NSTEMI) myocardial infarction: Secondary | ICD-10-CM

## 2023-06-05 DIAGNOSIS — E785 Hyperlipidemia, unspecified: Secondary | ICD-10-CM | POA: Diagnosis not present

## 2023-06-05 DIAGNOSIS — I2510 Atherosclerotic heart disease of native coronary artery without angina pectoris: Secondary | ICD-10-CM

## 2023-06-05 DIAGNOSIS — Z789 Other specified health status: Secondary | ICD-10-CM | POA: Diagnosis not present

## 2023-06-05 DIAGNOSIS — N1831 Chronic kidney disease, stage 3a: Secondary | ICD-10-CM

## 2023-06-05 NOTE — Patient Instructions (Signed)
 Medication Instructions:  Your Physician recommend you continue on your current medication as directed.    *If you need a refill on your cardiac medications before your next appointment, please call your pharmacy*  Lab Work: None ordered at this time   Follow-Up: At [facility], you and your health needs are our priority.  As part of our continuing mission to provide you with exceptional heart care, our providers are all part of one team.  This team includes your primary Cardiologist (physician) and Advanced Practice Providers or APPs (Physician Assistants and Nurse Practitioners) who all work together to provide you with the care you need, when you need it.  Your next appointment:   6 month(s)  Provider:   You may see Julien Nordmann, MD or Eula Listen, PA-

## 2023-06-12 ENCOUNTER — Ambulatory Visit
Admission: RE | Admit: 2023-06-12 | Discharge: 2023-06-12 | Disposition: A | Discharge status: Home / Self Care | Source: Ambulatory Visit | Attending: Pain Medicine | Admitting: Pain Medicine

## 2023-06-12 ENCOUNTER — Encounter: Payer: Self-pay | Admitting: Pain Medicine

## 2023-06-12 ENCOUNTER — Ambulatory Visit: Attending: Pain Medicine | Admitting: Pain Medicine

## 2023-06-12 VITALS — BP 121/71 | HR 73 | Temp 97.3°F | Resp 18 | Ht 70.0 in | Wt 188.0 lb

## 2023-06-12 DIAGNOSIS — E1122 Type 2 diabetes mellitus with diabetic chronic kidney disease: Secondary | ICD-10-CM | POA: Diagnosis not present

## 2023-06-12 DIAGNOSIS — M4727 Other spondylosis with radiculopathy, lumbosacral region: Secondary | ICD-10-CM

## 2023-06-12 DIAGNOSIS — R937 Abnormal findings on diagnostic imaging of other parts of musculoskeletal system: Secondary | ICD-10-CM | POA: Insufficient documentation

## 2023-06-12 DIAGNOSIS — G8929 Other chronic pain: Secondary | ICD-10-CM | POA: Insufficient documentation

## 2023-06-12 DIAGNOSIS — Z7901 Long term (current) use of anticoagulants: Secondary | ICD-10-CM | POA: Diagnosis not present

## 2023-06-12 DIAGNOSIS — M79605 Pain in left leg: Secondary | ICD-10-CM | POA: Diagnosis not present

## 2023-06-12 DIAGNOSIS — M5417 Radiculopathy, lumbosacral region: Secondary | ICD-10-CM | POA: Diagnosis not present

## 2023-06-12 DIAGNOSIS — M961 Postlaminectomy syndrome, not elsewhere classified: Secondary | ICD-10-CM | POA: Insufficient documentation

## 2023-06-12 DIAGNOSIS — M545 Low back pain, unspecified: Secondary | ICD-10-CM | POA: Diagnosis present

## 2023-06-12 DIAGNOSIS — G96198 Other disorders of meninges, not elsewhere classified: Secondary | ICD-10-CM

## 2023-06-12 MED ORDER — IOHEXOL 180 MG/ML  SOLN
INTRAMUSCULAR | Status: AC
  Filled 2023-06-12: qty 10

## 2023-06-12 MED ORDER — SODIUM CHLORIDE (PF) 0.9 % IJ SOLN
INTRAMUSCULAR | Status: AC
  Filled 2023-06-12: qty 20

## 2023-06-12 MED ORDER — ROPIVACAINE HCL 2 MG/ML IJ SOLN
INTRAMUSCULAR | Status: AC
  Filled 2023-06-12: qty 20

## 2023-06-12 MED ORDER — SODIUM CHLORIDE 0.9% FLUSH
4.0000 mL | Freq: Once | INTRAVENOUS | Status: AC
  Administered 2023-06-12: 4 mL

## 2023-06-12 MED ORDER — LIDOCAINE-EPINEPHRINE (PF) 2 %-1:200000 IJ SOLN
10.0000 mL | Freq: Once | INTRAMUSCULAR | Status: AC
  Administered 2023-06-12: 10 mL

## 2023-06-12 MED ORDER — PENTAFLUOROPROP-TETRAFLUOROETH EX AERO
INHALATION_SPRAY | Freq: Once | CUTANEOUS | Status: AC
  Administered 2023-06-12: 30 via TOPICAL

## 2023-06-12 MED ORDER — VANCOMYCIN HCL IN DEXTROSE 1-5 GM/200ML-% IV SOLN
1000.0000 mg | Freq: Once | INTRAVENOUS | Status: AC
  Administered 2023-06-12: 1000 mg via INTRAVENOUS
  Filled 2023-06-12: qty 200

## 2023-06-12 MED ORDER — FENTANYL CITRATE (PF) 100 MCG/2ML IJ SOLN
INTRAMUSCULAR | Status: AC
  Filled 2023-06-12: qty 2

## 2023-06-12 MED ORDER — HYALURONIDASE HUMAN 150 UNIT/ML IJ SOLN
1500.0000 [IU] | Freq: Once | INTRAMUSCULAR | Status: AC
  Administered 2023-06-12: 150 [IU] via INTRADERMAL
  Filled 2023-06-12: qty 10

## 2023-06-12 MED ORDER — FENTANYL CITRATE (PF) 100 MCG/2ML IJ SOLN
25.0000 ug | INTRAMUSCULAR | Status: DC | PRN
  Administered 2023-06-12: 50 ug via INTRAVENOUS

## 2023-06-12 MED ORDER — STERILE WATER FOR INJECTION IJ SOLN
10.0000 mL | Freq: Once | INTRAVENOUS | Status: AC
  Administered 2023-06-12: 10 mL via EPIDURAL
  Filled 2023-06-12: qty 4.27

## 2023-06-12 MED ORDER — LIDOCAINE HCL (PF) 2 % IJ SOLN
INTRAMUSCULAR | Status: AC
  Filled 2023-06-12: qty 10

## 2023-06-12 MED ORDER — IOHEXOL 180 MG/ML  SOLN
10.0000 mL | Freq: Once | INTRAMUSCULAR | Status: AC
  Administered 2023-06-12: 10 mL via EPIDURAL

## 2023-06-12 MED ORDER — LIDOCAINE HCL 2 % IJ SOLN
20.0000 mL | Freq: Once | INTRAMUSCULAR | Status: AC
  Administered 2023-06-12: 400 mg

## 2023-06-12 MED ORDER — TRIAMCINOLONE ACETONIDE 40 MG/ML IJ SUSP
INTRAMUSCULAR | Status: AC
  Filled 2023-06-12: qty 1

## 2023-06-12 MED ORDER — MIDAZOLAM HCL 5 MG/5ML IJ SOLN
INTRAMUSCULAR | Status: AC
  Filled 2023-06-12: qty 5

## 2023-06-12 MED ORDER — MIDAZOLAM HCL 5 MG/5ML IJ SOLN
0.5000 mg | Freq: Once | INTRAMUSCULAR | Status: AC
  Administered 2023-06-12: 1 mg via INTRAVENOUS

## 2023-06-12 MED ORDER — TRIAMCINOLONE ACETONIDE 40 MG/ML IJ SUSP
40.0000 mg | Freq: Once | INTRAMUSCULAR | Status: AC
  Administered 2023-06-12: 40 mg

## 2023-06-12 NOTE — Patient Instructions (Addendum)
 ____________________________________________________________________________________________  Lysis of Epidural Adhesions Utilizing the Epidural Approach (RACZ Epidural Adhesiolysis)  Introduction Bleeding into the epidural space following surgery or leakage of disc material following breakage or a tear of a disc most commonly causes epidural scarring.  Presumably, inflammation and compression of nerve roots by epidural scar (Adhesions) are the mechanism of persistent pain following back surgery, ruptured or herniated discs, or vertebral body fracture.  Epidural scar may also contribute to the pain of spinal column metastatic carcinoma, failed facet joint syndrome, and unexplained neck or low back pain.   Diagnosis Conventional studies such as myelograms, computerized tomography (CT Scans), and magnetic resonance imaging (MRI), are usually inadequate to make the diagnosis.  Injection of contrast material (dye) into the epidural space yields an "epidurogram", which is diagnostic for the presence or absence of epidural scar tissue.   Procedure Since further surgery would only produce more scar tissue, a different approach to the problem needs to be taken.  "Lysis of Epidural Adhesions" (Breakage of scar tissue) is an alternative to this problem.  The procedure consists of introducing an epidural catheter (thin plastic tube) into the epidural space (space between your spinal cord and the walls of the spinal canal, within your backbone).  Once in place, medications are injected through this tubing in order to break the scar tissue.  Although the catheter is placed within fifteen to twenty minutes, it is kept in the epidural space for a couple of hours.  During this time, the injection of medications through the tubing is performed.  At the end of the procedure the catheter is removed and the patient discharged to home.   Conditions Usually Treated with This Modality Conditions include, but are not  limited to: Failed Back Surgery Syndrome Post-Laminectomy Syndrome Epidural Adhesions Back and leg pain Sciatica Radiculopathy  Side Effects and Possible Complications Everything in medicine is subject to side effects and possible complications.  No two patients are alike.  Side effects and complications are not the same or equivalent to malpractice.  Malpractice refers to an injury sustained by a patient, which occurs as a consequence of negligence in the practice of medicine.  Side effects and complications, on the other hand, are untoward events, which can occur and may injure a patient, in certain percentages of the population.  These events can, and do occur even if everything goes according to plan, and in the absence of negligence or malpractice.  Possible side effects and complications of this procedure include, but are not limited to. Pain or worsening of symptoms Infection (local = abscess; or generalized = sepsis), including meningitis and death. Infection due to a steroid-induced immune system suppression. Bleeding, including hematomas, which might compress the spinal cord, therefore causing paralysis. Nerve damage, including sensory or motor weakness, and/or paralysis. Nerve damage, ranging from minor nerve irritation with pain, to major nerve damage with paralysis, impotence, urinary incontinence, and/or fecal incontinence. Allergic reactions ranging from a minor rush to an anaphylactic reaction and death Failure to relieve pain Spinal cord compression leading to paralysis Breakage of catheter (tubing) Unforeseen events:   Guarantees That It Will Help None. There are no guarantees in medicine.   Results of Treatment 93.9% of patients will experience some pain relief, which will be variable.  6.1% will experience no relief.     Duration of Results Only 12.3% will experience persistent pain relief beyond 12 months.  57.9% of male patients and 64.4% of male patients will  experience between 0-3 months of variable  degrees of pain relief.   Frequently Asked Questions  What is the incidence of complications? It is unknown.  Most complications occur sporadically and are reported as isolated case reports.  Can the procedure be repeated when and if my pain returns? Yes.  The effectiveness of subsequent repeat procedures is also variable.  Some patients obtain longer duration of pain relief while others obtain shorter duration.  Does this require that I be hospitalized? No.  On the average the procedure takes anywhere from 1 to 3 hours to complete.  What should I expect to feel after the procedure? If the procedure is successful, you should experience pain relief of variable degrees.  Will I obtain complete (100%) pain relief? Although possible, it is our experience that patients suffering from chronic pain, the cause of the pain is multifactorial, and therefore, highly unlikely to completely address the problem with only one type of therapy.  What is an Epidurolysis (RACZ) Procedure? Epidurolysis (RACZ) Procedure is used to dissolve some of the scar tissue from around entrapped nerves in the Epidural space of spine, so that medications such as cortisone can reach the affected areas. Dr. Phillips Hay pioneered this procedure.   What causes scarring (adhesions)? Scarring is most commonly caused from bleeding into the Epidural space following back surgery and the subsequent healing process. It is a natural occurrence following surgical intervention. Sometimes scarring can also occur when a disk ruptures and its contents leak out.   What is the purpose of it? To allow medications to reach affected nerves so that pain and other symptoms may be diminished.   How long does the procedure take?  First, a small catheter (surgical tube) is inserted in the Epidural space up to the area where the scar is located. This is done under sterile conditions using x-ray guidance.  This epidural catheter is secured to the skin using tape and the patient is transported to the recovery room. This part may take 45 minutes. While in the recovery area, series of medications will be injected via the catheter. This part may take 1.0 to 2.0 hours. The patient is kept in the recovery room for the duration of the procedure. Once all medications are injected, then the catheter is removed. The actual injections only take a few minutes, however, the procedure protocol calls for certain periods of time to elapse between the administration of medications.   What is actually injected? The injection consists of a mixture of a local anesthetic (numbing medicine), a steroid (anti-inflammatory medication), radiological contrast dye (to visualize the scar tissue), hyaluronidase (medicine to soften the scar), and a hyper-concentrated solution sterile salt water (to remove fluid from the swollen nerves via osmosis).   Will the injection hurt? Patients receive intravenous sedation and local infiltration of numbing medicine to minimize the discomfort from the procedure. Because the procedure involves the insertion of a needle, some discomfort is to be expected. In addition, because there is scar tissue and swelling inside of the epidural space, as the medications are injected, the sensation of pressure should be expected.   Will I be "put out" for this procedure? No. This procedure is done under local anesthesia and sedation, which makes the procedure easy to tolerate. The amount of sedation given generally depends upon the patient's tolerance. Communication with the patients during the procedure is essential to avoid complications.   How is the procedure performed? Catheter placement is done with the patient lying on their stomach. During the procedure, the patient is  monitored with a heart monitor, blood pressure monitor, and a blood oxygen monitoring device. The skin in the back is cleaned with  antiseptic solution and then the procedure is carried out. After the procedure, you are placed on your back or on your side. X-rays are used to assist in the placement of the catheter and to perform the epidurogram (injection of radiological contrast to confirm adequate placement).   What should I expect after the injection? Immediately after the injection, you may feel your legs slightly heavy and numb. Also, you may notice that your pain may improve or disappear. This may only last for a few hours and it is due to the local anesthetics (numbing medicine).    When can I return to work? Unless there are any complications, you should be able to return to work the next day. However, it may take a couple of days for you to get the full benefit of the procedure.   How long the effects of the medication last? The immediate effect is usually from the local anesthetic injected. This wears off in a few hours. The cortisone starts working in about 5 to 10 days and its effect can last for several days to a few months. The benefits from the procedure may last as long as 6 month.   How many times do I need to have this procedure performed? If the first procedure does not relieve your symptoms within one to two weeks, you may be recommended to have one more procedure. After this, we recommend 6 months between [facility] procedures.   Will the Epidurolysis (RACZ) Procedure help me? It is very difficult to predict if the procedure will indeed help you or not. Generally speaking, the patients who have recent scarring (e.g. following back surgery) respond better.    What are the risks and side effects? Generally speaking, this procedure is relatively safe. However, with any procedure there are risks and possible complications. These include, but are not limited to: spinal headaches; central nervous system infections such as meningitis, or epidural abscesses; epidural hematomas with possible spinal cord compression and  subsequent permanent paralysis; worsening of symptoms; failure to provide relief; nerve damage; urinary or fecal incontinence; blood vessel damage, etc. Side effects related to the steroids, such as: weight gain; increase in blood sugar (mainly in diabetics); water retention; suppression of body's immune system; mood disturbances, etc.. Some of patients may have anaphylactic allergic reactions to hyaluronidase. Fortunately, the serious side effects and complications are uncommon.   Who should not have this procedure? If you are allergic to any of the medications to be injected, if you are on a blood thinning medication (e.i. Coumadin), or if you have an active infection going on, you should not have the injection. The following are absolute contraindications for performing epidural adhesiolysis: Sepsis Chronic infection Coagulopathy (Bleeding disorder) Local infection at the procedure site Patient refusal Syrinx formation A relative contraindication is the presence of arachnoiditis.  How do I prepare for the procedure? Do not eat or drink for 6 hours before the procedure. If you take blood pressure medication in the morning, take it as usual, with just a sip of water. Take a shower the morning of the procedure using an antibacterial soap such as "Right Guard". Take a 1000 mg Vitamin C every day, before and after the procedure to build up your immune system and decrease the risk of infections. You may drink water up to 2 hours before the procedure. Empty your bladder before  getting your IV started.  ____________________________________________________________________________________________   ______________________________________________________________________    Post-Procedure Discharge Instructions  Instructions: Apply ice:  Purpose: This will minimize any swelling and discomfort after procedure.  When: Day of procedure, as soon as you get home. How: Fill a plastic sandwich bag with  crushed ice. Cover it with a small towel and apply to injection site. How long: (15 min on, 15 min off) Apply for 15 minutes then remove x 15 minutes.  Repeat sequence on day of procedure, until you go to bed. Apply heat:  Purpose: To treat any soreness and discomfort from the procedure. When: Starting the next day after the procedure. How: Apply heat to procedure site starting the day following the procedure. How long: May continue to repeat daily, until discomfort goes away. Food intake: Start with clear liquids (like water) and advance to regular food, as tolerated.  Physical activities: Keep activities to a minimum for the first 8 hours after the procedure. After that, then as tolerated. Driving: If you have received any sedation, be responsible and do not drive. You are not allowed to drive for 24 hours after having sedation. Blood thinner: (Applies only to those taking blood thinners) You may restart your blood thinner 6 hours after your procedure. Insulin: (Applies only to Diabetic patients taking insulin) As soon as you can eat, you may resume your normal dosing schedule. Infection prevention: Keep procedure site clean and dry. Shower daily and clean area with soap and water. Post-procedure Pain Diary: Extremely important that this be done correctly and accurately. Recorded information will be used to determine the next step in treatment. For the purpose of accuracy, follow these rules: Evaluate only the area treated. Do not report or include pain from an untreated area. For the purpose of this evaluation, ignore all other areas of pain, except for the treated area. After your procedure, avoid taking a long nap and attempting to complete the pain diary after you wake up. Instead, set your alarm clock to go off every hour, on the hour, for the initial 8 hours after the procedure. Document the duration of the numbing medicine, and the relief you are getting from it. Do not go to sleep and  attempt to complete it later. It will not be accurate. If you received sedation, it is likely that you were given a medication that may cause amnesia. Because of this, completing the diary at a later time may cause the information to be inaccurate. This information is needed to plan your care. Follow-up appointment: Keep your post-procedure follow-up evaluation appointment after the procedure (usually 2 weeks for most procedures, 6 weeks for radiofrequencies). DO NOT FORGET to bring you pain diary with you.   Expect: (What should I expect to see with my procedure?) From numbing medicine (AKA: Local Anesthetics): Numbness or decrease in pain. You may also experience some weakness, which if present, could last for the duration of the local anesthetic. Onset: Full effect within 15 minutes of injected. Duration: It will depend on the type of local anesthetic used. On the average, 1 to 8 hours.  From steroids (Applies only if steroids were used): Decrease in swelling or inflammation. Once inflammation is improved, relief of the pain will follow. Onset of benefits: Depends on the amount of swelling present. The more swelling, the longer it will take for the benefits to be seen. In some cases, up to 10 days. Duration: Steroids will stay in the system x 2 weeks. Duration of benefits will depend  on multiple posibilities including persistent irritating factors. Side-effects: If present, they may typically last 2 weeks (the duration of the steroids). Frequent: Cramps (if they occur, drink Gatorade and take over-the-counter Magnesium 450-500 mg once to twice a day); water retention with temporary weight gain; increases in blood sugar; decreased immune system response; increased appetite. Occasional: Facial flushing (red, warm cheeks); mood swings; menstrual changes. Uncommon: Long-term decrease or suppression of natural hormones; bone thinning. (These are more common with higher doses or more frequent use. This is  why we prefer that our patients avoid having any injection therapies in other practices.)  Very Rare: Severe mood changes; psychosis; aseptic necrosis. From procedure: Some discomfort is to be expected once the numbing medicine wears off. This should be minimal if ice and heat are applied as instructed.  Call if: (When should I call?) You experience numbness and weakness that gets worse with time, as opposed to wearing off. New onset bowel or bladder incontinence. (Applies only to procedures done in the spine)  Emergency Numbers: Durning business hours (Monday - Thursday, 8:00 AM - 4:00 PM) (Friday, 9:00 AM - 12:00 Noon): [phone] After hours: [phone] NOTE: If you are having a problem and are unable connect with, or to talk to a provider, then go to your nearest urgent care or emergency department. If the problem is serious and urgent, please call 911. ______________________________________________________________________

## 2023-06-12 NOTE — Progress Notes (Signed)
 Safety precautions to be maintained throughout the outpatient stay will include: orient to surroundings, keep bed in low position, maintain call bell within reach at all times, provide assistance with transfer out of bed and ambulation.   Dr. Shauna Hugh removed catheter from [facility] procedure at 1304. Patient tolerated well. Catheter tip in tact.

## 2023-06-25 DIAGNOSIS — R8290 Unspecified abnormal findings in urine: Secondary | ICD-10-CM | POA: Diagnosis not present

## 2023-06-25 DIAGNOSIS — N1832 Chronic kidney disease, stage 3b: Secondary | ICD-10-CM | POA: Diagnosis not present

## 2023-06-25 DIAGNOSIS — N281 Cyst of kidney, acquired: Secondary | ICD-10-CM | POA: Diagnosis not present

## 2023-06-25 DIAGNOSIS — E781 Pure hyperglyceridemia: Secondary | ICD-10-CM | POA: Diagnosis not present

## 2023-06-25 DIAGNOSIS — I10 Essential (primary) hypertension: Secondary | ICD-10-CM | POA: Diagnosis not present

## 2023-06-25 DIAGNOSIS — E1122 Type 2 diabetes mellitus with diabetic chronic kidney disease: Secondary | ICD-10-CM | POA: Diagnosis not present

## 2023-06-25 DIAGNOSIS — N400 Enlarged prostate without lower urinary tract symptoms: Secondary | ICD-10-CM | POA: Diagnosis not present

## 2023-06-25 DIAGNOSIS — R7303 Prediabetes: Secondary | ICD-10-CM | POA: Diagnosis not present

## 2023-06-26 ENCOUNTER — Encounter: Payer: Self-pay | Admitting: *Deleted

## 2023-06-27 ENCOUNTER — Ambulatory Visit
Admission: RE | Admit: 2023-06-27 | Discharge: 2023-06-27 | Disposition: A | Discharge status: Home / Self Care | Payer: HMO | Attending: Gastroenterology | Admitting: Gastroenterology

## 2023-06-27 ENCOUNTER — Ambulatory Visit: Admitting: Anesthesiology

## 2023-06-27 ENCOUNTER — Other Ambulatory Visit: Payer: Self-pay

## 2023-06-27 ENCOUNTER — Encounter
Admission: RE | Disposition: A | Discharge status: Home / Self Care | Payer: Self-pay | Source: Home / Self Care | Attending: Gastroenterology

## 2023-06-27 DIAGNOSIS — I452 Bifascicular block: Secondary | ICD-10-CM | POA: Insufficient documentation

## 2023-06-27 DIAGNOSIS — I252 Old myocardial infarction: Secondary | ICD-10-CM | POA: Diagnosis not present

## 2023-06-27 DIAGNOSIS — E785 Hyperlipidemia, unspecified: Secondary | ICD-10-CM | POA: Insufficient documentation

## 2023-06-27 DIAGNOSIS — K625 Hemorrhage of anus and rectum: Secondary | ICD-10-CM | POA: Insufficient documentation

## 2023-06-27 DIAGNOSIS — I2510 Atherosclerotic heart disease of native coronary artery without angina pectoris: Secondary | ICD-10-CM | POA: Insufficient documentation

## 2023-06-27 DIAGNOSIS — Z7902 Long term (current) use of antithrombotics/antiplatelets: Secondary | ICD-10-CM | POA: Insufficient documentation

## 2023-06-27 DIAGNOSIS — D122 Benign neoplasm of ascending colon: Secondary | ICD-10-CM | POA: Diagnosis not present

## 2023-06-27 DIAGNOSIS — Z951 Presence of aortocoronary bypass graft: Secondary | ICD-10-CM | POA: Diagnosis not present

## 2023-06-27 DIAGNOSIS — K649 Unspecified hemorrhoids: Secondary | ICD-10-CM | POA: Diagnosis not present

## 2023-06-27 DIAGNOSIS — K641 Second degree hemorrhoids: Secondary | ICD-10-CM | POA: Diagnosis not present

## 2023-06-27 DIAGNOSIS — Z860101 Personal history of adenomatous and serrated colon polyps: Secondary | ICD-10-CM | POA: Diagnosis not present

## 2023-06-27 DIAGNOSIS — I4891 Unspecified atrial fibrillation: Secondary | ICD-10-CM | POA: Diagnosis not present

## 2023-06-27 DIAGNOSIS — N183 Chronic kidney disease, stage 3 unspecified: Secondary | ICD-10-CM | POA: Diagnosis not present

## 2023-06-27 DIAGNOSIS — Z9049 Acquired absence of other specified parts of digestive tract: Secondary | ICD-10-CM | POA: Insufficient documentation

## 2023-06-27 DIAGNOSIS — K635 Polyp of colon: Secondary | ICD-10-CM | POA: Diagnosis not present

## 2023-06-27 DIAGNOSIS — I129 Hypertensive chronic kidney disease with stage 1 through stage 4 chronic kidney disease, or unspecified chronic kidney disease: Secondary | ICD-10-CM | POA: Insufficient documentation

## 2023-06-27 DIAGNOSIS — K5730 Diverticulosis of large intestine without perforation or abscess without bleeding: Secondary | ICD-10-CM | POA: Diagnosis not present

## 2023-06-27 HISTORY — DX: Gastro-esophageal reflux disease without esophagitis: K21.9

## 2023-06-27 HISTORY — DX: Acute myocardial infarction, unspecified: I21.9

## 2023-06-27 HISTORY — DX: Cardiac arrhythmia, unspecified: I49.9

## 2023-06-27 SURGERY — COLONOSCOPY WITH PROPOFOL
Anesthesia: General

## 2023-06-27 MED ORDER — SODIUM CHLORIDE 0.9 % IV SOLN: INTRAVENOUS | Status: DC

## 2023-06-27 MED ORDER — LIDOCAINE HCL (CARDIAC) PF 100 MG/5ML IV SOSY
PREFILLED_SYRINGE | INTRAVENOUS | Status: DC | PRN
  Administered 2023-06-27: 80 mg via INTRAVENOUS

## 2023-06-27 MED ORDER — PROPOFOL 10 MG/ML IV BOLUS
INTRAVENOUS | Status: DC | PRN
  Administered 2023-06-27: 30 mg via INTRAVENOUS
  Administered 2023-06-27: 50 mg via INTRAVENOUS

## 2023-06-27 MED ORDER — DEXMEDETOMIDINE HCL IN NACL 80 MCG/20ML IV SOLN
INTRAVENOUS | Status: DC | PRN
  Administered 2023-06-27: 20 ug via INTRAVENOUS

## 2023-06-27 MED ORDER — PROPOFOL 1000 MG/100ML IV EMUL
INTRAVENOUS | Status: AC
  Filled 2023-06-27: qty 100

## 2023-06-27 MED ORDER — PROPOFOL 500 MG/50ML IV EMUL
INTRAVENOUS | Status: DC | PRN
  Administered 2023-06-27: 75 ug/kg/min via INTRAVENOUS

## 2023-06-27 NOTE — Interval H&P Note (Signed)
 History and Physical Interval Note:  06/27/2023 10:17 AM  Rodney Hall.  has presented today for surgery, with the diagnosis of rectal bleeding,hx of adenomatous polyp of colon.  The various methods of treatment have been discussed with the patient and family. After consideration of risks, benefits and other options for treatment, the patient has consented to  Procedure(s): COLONOSCOPY WITH PROPOFOL  (N/A) as a surgical intervention.  The patient's history has been reviewed, patient examined, no change in status, stable for surgery.  I have reviewed the patient's chart and labs.  Questions were answered to the patient's satisfaction.     Shane Darling  Ok to proceed with colonoscopy

## 2023-06-27 NOTE — Transfer of Care (Signed)
 Immediate Anesthesia Transfer of Care Note  Patient: Rodney Hall.  Procedure(s) Performed: COLONOSCOPY WITH PROPOFOL  POLYPECTOMY, INTESTINE  Patient Location: PACU  Anesthesia Type:General  Level of Consciousness: sedated  Airway & Oxygen Therapy: Patient Spontanous Breathing  Post-op Assessment: Report given to RN and Post -op Vital signs reviewed and stable  Post vital signs: Reviewed and stable  Last Vitals:  Vitals Value Taken Time  BP    Temp    Pulse    Resp    SpO2      Last Pain:  Vitals:   06/27/23 0938  TempSrc: Temporal  PainSc: 0-No pain         Complications: No notable events documented.

## 2023-06-27 NOTE — Anesthesia Postprocedure Evaluation (Signed)
 Anesthesia Post Note  Patient: Rodney Hall.  Procedure(s) Performed: COLONOSCOPY WITH PROPOFOL  POLYPECTOMY, INTESTINE  Patient location during evaluation: PACU Anesthesia Type: General Level of consciousness: awake and alert, oriented and patient cooperative Pain management: pain level controlled Vital Signs Assessment: post-procedure vital signs reviewed and stable Respiratory status: spontaneous breathing, nonlabored ventilation and respiratory function stable Cardiovascular status: blood pressure returned to baseline and stable Postop Assessment: adequate PO intake Anesthetic complications: no   No notable events documented.   Last Vitals:  Vitals:   06/27/23 0938 06/27/23 1048  BP: 122/84 98/84  Pulse: 87 81  Resp: 16 15  Temp: (!) 36.1 C (!) 36.1 C  SpO2: 100% 97%    Last Pain:  Vitals:   06/27/23 1048  TempSrc: Temporal  PainSc: Asleep                 Dorothey Gate

## 2023-06-27 NOTE — Op Note (Addendum)
 [facility]astroenterology Patient Name: Rodney Hall Procedure Date: 06/27/2023 10:17 AM MRN: [id number] Account #: [id number] Date of Birth: 1946-02-16 Admit Type: Outpatient Age: 78 Room: [facility] ENDO ROOM 3 Gender: Male Note Status: Supervisor Override Instrument Name: Charlyn Cooley [id number] Procedure:             Colonoscopy Indications:           Rectal bleeding, Follow-up for history of adenomatous                         polyps in the colon Providers:             Leida Puna MD, MD Medicines:             Monitored Anesthesia Care Complications:         No immediate complications. Estimated blood loss:                         Minimal. Procedure:             Pre-Anesthesia Assessment:                        - Prior to the procedure, a History and Physical was                         performed, and patient medications and allergies were                         reviewed. The patient is competent. The risks and                         benefits of the procedure and the sedation options and                         risks were discussed with the patient. All questions                         were answered and informed consent was obtained.                         Patient identification and proposed procedure were                         verified by the physician, the nurse, the                         anesthesiologist, the anesthetist and the technician                         in the endoscopy suite. Mental Status Examination:                         alert and oriented. Airway Examination: normal                         oropharyngeal airway and neck mobility. Respiratory                         Examination: clear to auscultation. CV Examination:  normal. Prophylactic Antibiotics: The patient does not                         require prophylactic antibiotics. Prior                         Anticoagulants: The patient has taken Plavix                           (clopidogrel ), last dose was 7 days prior to                         procedure. ASA Grade Assessment: II - A patient with                         mild systemic disease. After reviewing the risks and                         benefits, the patient was deemed in satisfactory                         condition to undergo the procedure. The anesthesia                         plan was to use monitored anesthesia care (MAC).                         Immediately prior to administration of medications,                         the patient was re-assessed for adequacy to receive                         sedatives. The heart rate, respiratory rate, oxygen                         saturations, blood pressure, adequacy of pulmonary                         ventilation, and response to care were monitored                         throughout the procedure. The physical status of the                         patient was re-assessed after the procedure.                        After obtaining informed consent, the colonoscope was                         passed under direct vision. Throughout the procedure,                         the patient's blood pressure, pulse, and oxygen                         saturations were monitored continuously. The  Colonoscope was introduced through the anus and                         advanced to the the cecum, identified by appendiceal                         orifice and ileocecal valve. The colonoscopy was                         performed without difficulty. The patient tolerated                         the procedure well. The quality of the bowel                         preparation was adequate to identify polyps. The                         ileocecal valve, appendiceal orifice, and rectum were                         photographed. Findings:      The perianal and digital rectal examinations were normal.      Five sessile polyps were found in  the ascending colon. The polyps were 2       to 5 mm in size. These polyps were removed with a cold snare. Resection       and retrieval were complete. Estimated blood loss was minimal.      Many large-mouthed and small-mouthed diverticula were found in the       sigmoid colon and descending colon.      Internal hemorrhoids were found during retroflexion. The hemorrhoids       were Grade II (internal hemorrhoids that prolapse but reduce       spontaneously).      The exam was otherwise without abnormality on direct and retroflexion       views. Impression:            - Five 2 to 5 mm polyps in the ascending colon,                         removed with a cold snare. Resected and retrieved.                        - Diverticulosis in the sigmoid colon and in the                         descending colon.                        - Internal hemorrhoids.                        - The examination was otherwise normal on direct and                         retroflexion views. Recommendation:        - Discharge patient to home.                        -  Resume previous diet.                        - Continue present medications.                        - Await pathology results.                        - Resume Plavix  (clopidogrel ) at prior dose today.                        - Repeat colonoscopy is not recommended due to current                         age (110 years or older) for surveillance.                        - Return to referring physician as previously                         scheduled. Procedure Code(s):     --- Professional ---                        [id number], Colonoscopy, flexible; with removal of                         tumor(s), polyp(s), or other lesion(s) by snare                         technique Diagnosis Code(s):     --- Professional ---                        D12.2, Benign neoplasm of ascending colon                        K64.1, Second degree hemorrhoids                         K62.5, Hemorrhage of anus and rectum                        K57.30, Diverticulosis of large intestine without                         perforation or abscess without bleeding CPT copyright 2022 [employer]. All rights reserved. The codes documented in this report are preliminary and upon coder review may  be revised to meet current compliance requirements. Leida Puna MD, MD 06/27/2023 10:50:50 AM Number of Addenda: 0 Note Initiated On: 06/27/2023 10:17 AM Scope Withdrawal Time: 0 hours 11 minutes 6 seconds  Total Procedure Duration: 0 hours 19 minutes 25 seconds  Estimated Blood Loss:  Estimated blood loss was minimal.      [facility]

## 2023-06-27 NOTE — H&P (Signed)
 Outpatient short stay form Pre-procedure 06/27/2023  Shane Darling, MD  Primary Physician: Lamon Pillow, MD  Reason for visit:  Rectal Bleeding  History of present illness:    78 y/o gentleman with CAD, HLD, and arthritis here for colonoscopy for rectal bleeding. Last colonoscopy in 2019 with polyp of unknown type. Takes plavix  with last dose 7 days ago. History of cholecystectomy. No family history of GI malignancies.    Current Facility-Administered Medications:    0.9 %  sodium chloride  infusion, , Intravenous, Continuous, Kyden Potash, Leanora Prophet, MD, Last Rate: 20 mL/hr at 06/27/23 0945, New Bag at 06/27/23 0945  Medications Prior to Admission  Medication Sig Dispense Refill Last Dose/Taking   acetaminophen  (TYLENOL ) 325 MG tablet Take 650 mg by mouth every 6 (six) hours as needed for moderate pain or headache.   Past Week   imipramine  (TOFRANIL ) 50 MG tablet Take 1 tablet (50 mg total) by mouth at bedtime. 90 tablet 3 06/26/2023   omeprazole  (PRILOSEC) 20 MG capsule Take 20 mg by mouth 2 (two) times daily.   06/26/2023   tamsulosin  (FLOMAX ) 0.4 MG CAPS capsule Take 1 capsule (0.4 mg total) by mouth daily after supper. 90 capsule 3 06/26/2023   aspirin  EC 81 MG tablet Take 1 tablet (81 mg total) by mouth daily. Swallow whole. 30 tablet 12 06/20/2023   atorvastatin  (LIPITOR) 10 MG tablet Take 1 tablet (10 mg total) by mouth daily. 90 tablet 3    busPIRone  (BUSPAR ) 7.5 MG tablet Start 1/2 tablet daily for 6 days then increase to 1/2 tablet twice a day for 6 days. Then 1 tablet twice days 60 tablet 2    clopidogrel  (PLAVIX ) 75 MG tablet Take 1 tablet (75 mg total) by mouth daily. 90 tablet 3 06/20/2023   oxyCODONE -acetaminophen  (PERCOCET/ROXICET) 5-325 MG tablet Take 1 tablet by mouth every 4 (four) hours as needed for severe pain (pain score 7-10). 30 tablet 0    pantoprazole  (PROTONIX ) 40 MG tablet Take 1 tablet (40 mg total) by mouth daily for 14 days. (Patient not taking: Reported on  06/12/2023) 14 tablet 0      Allergies  Allergen Reactions   Pravastatin      jittery   Amoxicillin Rash    ++ got ceftriaxone  in the past++ Has patient had a PCN reaction causing immediate rash, facial/tongue/throat swelling, SOB or lightheadedness with hypotension: Yes Has patient had a PCN reaction causing severe rash involving mucus membranes or skin necrosis: No Has patient had a PCN reaction that required hospitalization No Has patient had a PCN reaction occurring within the last 10 years: No If all of the above answers are "NO", then may proceed with Cephalosporin use.       Past Medical History:  Diagnosis Date   Abdominal mass    Abdominal pain 06/02/2017   Anxiety    Pt. denies   Arthritis    pt. 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    Physical Exam  Gen: Alert, oriented. Appears stated age.  HEENT: PERRLA. Lungs: No respiratory distress. CV: RRR Abd: soft, benign, no masses Ext: No edema    Planned procedures: Proceed with colonoscopy. The patient understands the nature of the planned procedure, indications, risks, alternatives and potential complications including but not limited to bleeding, infection, perforation, damage to internal organs and possible oversedation/side effects from anesthesia. The patient agrees and gives consent to proceed.  Please refer to procedure notes for findings, recommendations and patient disposition/instructions.     Shane Darling,  MD [facility]

## 2023-06-27 NOTE — Anesthesia Preprocedure Evaluation (Addendum)
 Anesthesia Evaluation  Patient identified by MRN, date of birth, ID band Patient awake    Reviewed: Allergy & Precautions, NPO status , Patient's Chart, lab work & pertinent test results  History of Anesthesia Complications Negative for: history of anesthetic complications  Airway Mallampati: IV   Neck ROM: Full    Dental  (+) Missing   Pulmonary neg pulmonary ROS   Pulmonary exam normal breath sounds clear to auscultation       Cardiovascular + CAD (s/p MI and CABG)  Normal cardiovascular exam+ dysrhythmias (a fib on Plavix ) Supra Ventricular Tachycardia  Rhythm:Regular Rate:Normal  ECG 04/01/23:  Normal sinus rhythm Right bundle branch block Left anterior fascicular block Bifascicular block Consistent with prior tracings  Echo 04/09/23:  1. Left ventricular ejection fraction, by estimation, is 55 to 60%. Left ventricular ejection fraction by PLAX is 57 %. The left ventricle has normal function. The left ventricle has no regional wall motion abnormalities. Left ventricular diastolic parameters are indeterminate. The average left ventricular global longitudinal strain is -15.7 %. The global longitudinal strain is normal.   2. Right ventricular systolic function is mildly reduced. The right ventricular size is normal.   3. Left atrial size was mildly dilated.   4. The mitral valve is normal in structure. Mild mitral valve regurgitation. No evidence of mitral stenosis.   5. The aortic valve is normal in structure. Aortic valve regurgitation is not visualized. No aortic stenosis is present.   6. The inferior vena cava is normal in size with greater than 50% respiratory variability, suggesting right atrial pressure of 3 mmHg.    Neuro/Psych negative neurological ROS     GI/Hepatic ,GERD  ,,  Endo/Other    Renal/GU Renal disease (stage III CKD)     Musculoskeletal  (+) Arthritis ,    Abdominal   Peds  Hematology  (+)  Blood dyscrasia, anemia   Anesthesia Other Findings Cardiology note 06/05/23:  1. CAD involving the native coronary arteries with NSTEMI status post 5 vessel CABG without angina: He is doing well without symptoms concerning for angina or cardiac decompensation.  Continue aggressive risk factor modification and secondary prevention including aspirin  and clopidogrel  with likely transition to mono antiplatelet therapy at next visit.  He will otherwise continue atorvastatin  10 mg.  He has not been maintained on beta-blocker secondary to prior bifascicular block.  No indication for further ischemic testing at this time.   2. Postoperative A-fib: Maintaining sinus rhythm on most recent EKG and my exam today.  No longer on amiodarone  or beta-blocker.  No evidence for recurrence again in the setting has not been maintained on OAC.  Should he have documented recurrence of A-fib, anticoagulation will need to be revisited at that time.   3. PSVT: Quiescent.  Outpatient cardiac monitoring showed no evidence of sustained arrhythmias.  Not requiring AV nodal blocking medication.   4. HTN: Blood pressure is well-controlled in the office today.  Not currently requiring antihypertensive therapy.   5. HLD with statin intolerance: LDL 74 in 03/2023 with normal AST/ALT in 05/2023.  Now back on atorvastatin  10 mg and tolerating.  Anticipate repeat fasting lipid panel and LFTs at next visit.  Historically has preferred to avoid PCSK9 inhibitor due to ejection component.   6. Preprocedure cardiac risk stratification: He is scheduled to undergo colonoscopy on 06/27/2023 and indicates he will be undergoing a spinal procedure next week.  Per [facility] Activity Status Index, he can achieve > 4 METs without cardiac limitation.  Per Revised Cardiac Risk Index, he has a 6.0% estimated rate of adverse cardiac event in the periprocedural timeframe.  Recommend holding apixaban 5 days prior to procedure with continuation of aspirin  throughout the  periprocedural timeframe as long as bleeding risk is not too great, particularly for spinal procedure.  No further cardiac testing indicated at this time and he may proceed with noncardiac procedure at an overall acceptable risk.    Disposition: F/u with Dr. Gollan or an APP in 6 months.   Reproductive/Obstetrics                             Anesthesia Physical Anesthesia Plan  ASA: 3  Anesthesia Plan: General   Post-op Pain Management:    Induction: Intravenous  PONV Risk Score and Plan: 2 and Propofol  infusion, TIVA and Treatment may vary due to age or medical condition  Airway Management Planned: Natural Airway  Additional Equipment:   Intra-op Plan:   Post-operative Plan:   Informed Consent: I have reviewed the patients History and Physical, chart, labs and discussed the procedure including the risks, benefits and alternatives for the proposed anesthesia with the patient or authorized representative who has indicated his/her understanding and acceptance.       Plan Discussed with: CRNA  Anesthesia Plan Comments: (LMA/GETA backup discussed.  Patient consented for risks of anesthesia including but not limited to:  - adverse reactions to medications - damage to eyes, teeth, lips or other oral mucosa - nerve damage due to positioning  - sore throat or hoarseness - damage to heart, brain, nerves, lungs, other parts of body or loss of life  Informed patient about role of CRNA in peri- and intra-operative care.  Patient voiced understanding.)        Anesthesia Quick Evaluation

## 2023-06-28 ENCOUNTER — Encounter: Payer: Self-pay | Admitting: Gastroenterology

## 2023-06-30 LAB — SURGICAL PATHOLOGY

## 2023-07-02 DIAGNOSIS — R7303 Prediabetes: Secondary | ICD-10-CM | POA: Diagnosis not present

## 2023-07-02 DIAGNOSIS — N200 Calculus of kidney: Secondary | ICD-10-CM | POA: Diagnosis not present

## 2023-07-02 DIAGNOSIS — E1122 Type 2 diabetes mellitus with diabetic chronic kidney disease: Secondary | ICD-10-CM | POA: Diagnosis not present

## 2023-07-02 DIAGNOSIS — Z9889 Other specified postprocedural states: Secondary | ICD-10-CM | POA: Diagnosis not present

## 2023-07-02 DIAGNOSIS — N1832 Chronic kidney disease, stage 3b: Secondary | ICD-10-CM | POA: Diagnosis not present

## 2023-07-02 DIAGNOSIS — N281 Cyst of kidney, acquired: Secondary | ICD-10-CM | POA: Diagnosis not present

## 2023-07-02 DIAGNOSIS — I10 Essential (primary) hypertension: Secondary | ICD-10-CM | POA: Diagnosis not present

## 2023-07-02 DIAGNOSIS — E781 Pure hyperglyceridemia: Secondary | ICD-10-CM | POA: Diagnosis not present

## 2023-07-02 DIAGNOSIS — N400 Enlarged prostate without lower urinary tract symptoms: Secondary | ICD-10-CM | POA: Diagnosis not present

## 2023-07-03 ENCOUNTER — Ambulatory Visit (HOSPITAL_BASED_OUTPATIENT_CLINIC_OR_DEPARTMENT_OTHER): Admitting: Pain Medicine

## 2023-07-03 DIAGNOSIS — Z91199 Patient's noncompliance with other medical treatment and regimen due to unspecified reason: Secondary | ICD-10-CM

## 2023-07-03 DIAGNOSIS — Z09 Encounter for follow-up examination after completed treatment for conditions other than malignant neoplasm: Secondary | ICD-10-CM

## 2023-07-03 NOTE — Progress Notes (Signed)
 Department: [facility] Date: 07/03/2023  Encounter Type: PPE (post-procedure evaluation. Event: NO SHOW.  Reason: Failure to keep appointment may interfere with appropriate evaluation of treatment results and/or future treatment approval.

## 2023-07-08 ENCOUNTER — Ambulatory Visit: Payer: Self-pay

## 2023-07-08 ENCOUNTER — Other Ambulatory Visit: Payer: Self-pay | Admitting: Family Medicine

## 2023-07-08 VITALS — BP 118/70 | Ht 70.0 in | Wt 194.5 lb

## 2023-07-08 DIAGNOSIS — Z0000 Encounter for general adult medical examination without abnormal findings: Secondary | ICD-10-CM | POA: Diagnosis not present

## 2023-07-08 DIAGNOSIS — G8929 Other chronic pain: Secondary | ICD-10-CM

## 2023-07-08 DIAGNOSIS — M545 Low back pain, unspecified: Secondary | ICD-10-CM

## 2023-07-08 MED ORDER — OXYCODONE-ACETAMINOPHEN 5-325 MG PO TABS: 1.0000 | ORAL_TABLET | ORAL | 0 refills | Status: DC | PRN

## 2023-07-08 NOTE — Progress Notes (Unsigned)
 PROVIDER NOTE: Interpretation of information contained herein should be left to medically-trained personnel. Specific patient instructions are provided elsewhere under "Patient Instructions" section of medical record. This document was created in part using AI and STT-dictation technology, any transcriptional errors that may result from this process are unintentional.  Patient: Rodney Hall.  Service: E/M   PCP: Lamon Pillow, MD  DOB: [birth year]  DOS: 07/09/2023  Provider: Candi Chafe, MD  MRN: [id number]  Delivery: Face-to-face  Specialty: Interventional Pain Management  Type: Established Patient  Setting: Ambulatory outpatient facility  Specialty designation: 09  Referring Prov.: Lamon Pillow, MD  Location: Outpatient office facility       HPI  [name], a 78 y.o. year old male, is here today because of his Chronic bilateral low back pain without sciatica [M54.50, G89.29]. Rodney Hall primary complain today is No chief complaint on file.  Pertinent problems: Rodney Hall has Impingement syndrome of shoulder region; History of spinal fusion; Lumbar post-laminectomy syndrome; Lumbosacral spondylosis with radiculopathy; Abnormal CT scan, lumbar spine (06/17/2016); Failed back surgical syndrome; Chronic low back pain (1ry area of Pain) (Bilateral) w/o sciatica; Chronic lower extremity pain (Left); Epidural fibrosis; Lumbosacral radiculopathy at S1 (Left); Lumbosacral radiculopathy at L5 (Left); Lumbar facet hypertrophy (Multilevel) (Bilateral); and Abnormal MRI, lumbar spine (02/04/2023) [facility]) on their pertinent problem list. Pain Assessment: Severity of   is reported as a  /10. Location:    / . Onset:  . Quality:  . Timing:  . Modifying factor(s):  Aaron Aas Vitals:  vitals were not taken for this visit.  BMI: Estimated body mass index is 27.03 kg/m as calculated from the following:   Height as of 06/27/23: 5\' 10"  (1.778 m).   Weight as of 06/27/23: 188  lb 6.4 oz (85.5 kg). Last encounter: 07/03/2023. Last procedure: 06/12/2023.  Reason for encounter: post-procedure evaluation and assessment.   Discussed the use of AI scribe software for clinical note transcription with the patient, who gave verbal consent to proceed.  History of Present Illness          Post-procedure evaluation    Procedure:          Anesthesia, Analgesia, Anxiolysis:  Type: Therapeutic Percutaneous Epidural Neuroplasty and Lysis of Adhesions (RACZ Procedure)  #1  Region: Caudal Level: Sacrococcygeal   Laterality: Midline aiming at the left  Anesthesia: Local (1-2% Lidocaine )  Anxiolysis: IV  Sedation: Moderate  Guidance: Fluoroscopy           Position: Prone   1. 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 Chronic lower extremity pain (Left)   3. Failed back surgical syndrome   4. Epidural fibrosis   5. Lumbar post-laminectomy syndrome   6. Lumbosacral radiculopathy at L5 (Left)   7. Lumbosacral radiculopathy at S1 (Left)   8. Lumbosacral spondylosis with radiculopathy   9. Abnormal MRI, lumbar spine (02/04/2023) [facility])   10. Chronic anticoagulation (Plavix )   11. 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 Subsequent 4-6 hours post-procedure:   ***. Analgesia past initial 6 hours:   ***. Ongoing improvement:  Analgesic:  *** Function:    ***    ROM:    ***      Pharmacotherapy Assessment  Analgesic:  No chronic opioid analgesics therapy prescribed by our practice. None. MME/day: 0 mg/day   Monitoring: [place] PMP: [name]       Pharmacotherapy: No side-effects or adverse reactions reported. Compliance: No problems identified. Effectiveness: Clinically acceptable.  No notes on file  No results found for: "CBDTHCR" No  results found for: "D8THCCBX" No results found for: "D9THCCBX"  UDS:  No results found for: "SUMMARY"    ROS  Constitutional: Denies any fever or chills Gastrointestinal: No reported hemesis, hematochezia, vomiting, or acute GI distress Musculoskeletal: Denies any acute onset joint swelling, redness, loss of ROM, or weakness Neurological: No reported episodes of acute onset apraxia, aphasia, dysarthria, agnosia, amnesia, paralysis, loss of coordination, or loss of consciousness  Medication Review  acetaminophen , aspirin  EC, atorvastatin , busPIRone , clopidogrel , imipramine , omeprazole , oxyCODONE -acetaminophen , pantoprazole , and tamsulosin   History Review  Allergy: Rodney Hall is allergic to pravastatin  and amoxicillin. Drug: Rodney Hall  reports no history of drug use. Alcohol:  reports no history of alcohol use. Tobacco:  reports that he has never smoked. He has never been exposed to tobacco smoke. He has never used smokeless tobacco. Social: Rodney Hall  reports that he has never smoked. He has never been exposed to tobacco smoke. He has never used smokeless tobacco. He reports that he does not drink alcohol and does not use drugs. 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 Surgical: Rodney Hall  has a past surgical history that includes Back surgery (2012); Neck surgery (2000); Cataract extraction, bilateral; Tonsillectomy and adenoidectomy; Anterior lat lumbar fusion (Right, 07/30/2016); EUS (N/A, 07/10/2017); Tennis elbow release/nirschel procedure; Eye surgery;  Cholecystectomy (N/A, 07/25/2017); Colonoscopy with propofol  (N/A, 12/12/2017); Hemi-microdiscectomy lumbar laminectomy level 1 (09/2018); LEFT HEART CATH AND CORONARY ANGIOGRAPHY (N/A, 09/04/2022); Coronary artery bypass graft (N/A, 09/07/2022); TEE without cardioversion (N/A, 09/07/2022); LEFT HEART CATH AND CORONARY ANGIOGRAPHY (N/A, 09/06/2022); IABP Insertion (N/A, 09/06/2022); Colonoscopy with propofol  (N/A, 06/27/2023); and Polypectomy (06/27/2023). Family: family history includes Cancer in his mother; Diabetes in his brother and father; Heart disease in his sister; Rheumatic fever in his sister.  Laboratory Chemistry Profile   Renal Lab Results  Component Value Date   BUN 20 05/29/2023   CREATININE 1.82 (H) 05/29/2023   BCR 9 (L) 04/01/2023   GFRAA 58 (L) 01/13/2019   GFRNONAA 38 (L) 05/29/2023    Hepatic Lab Results  Component Value Date   AST 19 05/29/2023   ALT 14 05/29/2023   ALBUMIN  4.4 05/29/2023   ALKPHOS 61 05/29/2023   HCVAB <0.1 06/02/2017   LIPASE 39 05/29/2023    Electrolytes Lab Results  Component Value Date   NA 135 05/29/2023   K 4.7 05/29/2023   CL 102 05/29/2023   CALCIUM  9.2 05/29/2023   MG 2.3 09/08/2022   PHOS 3.5 02/22/2022    Bone No results found for: "VD25OH", "VD125OH2TOT", "RU0454UJ8", "JX9147WG9", "25OHVITD1", "25OHVITD2", "25OHVITD3", "TESTOFREE", "TESTOSTERONE"  Inflammation (CRP: Acute Phase) (ESR: Chronic Phase) No results found for: "CRP", "ESRSEDRATE", "LATICACIDVEN"       Note: Above Lab results reviewed.  Recent Imaging Review  DG PAIN CLINIC C-ARM 1-60 MIN NO REPORT Fluoro was used, but no Radiologist interpretation will be provided.  Please refer to "NOTES" tab for provider  progress note. Note: Reviewed        Physical Exam  General appearance: Well nourished, well developed, and well hydrated. In no apparent acute distress Mental status: Alert, oriented x 3 (person, place, & time)       Respiratory: No evidence of acute  respiratory distress Eyes: PERLA Vitals: There were no vitals taken for this visit. BMI: Estimated body mass index is 27.03 kg/m as calculated from the following:   Height as of 06/27/23: 5\' 10"  (1.778 m).   Weight as of 06/27/23: 188 lb 6.4 oz (85.5 kg). Ideal: Ideal body weight: 73 kg (160 lb 15 oz) Adjusted ideal body weight: 78 kg (171 lb 14.7 oz)  Assessment   Diagnosis Status  1. 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 Chronic lower extremity pain (Left)   3. Epidural fibrosis   4. Failed back surgical syndrome   5. Lumbar post-laminectomy syndrome   6. Lumbosacral radiculopathy at L5 (Left)   7. Lumbosacral radiculopathy at S1 (Left)   8. Lumbosacral spondylosis with radiculopathy   9. Abnormal MRI, lumbar spine (02/04/2023) [facility])   10. Abnormal CT scan, lumbar spine (06/17/2016)   11. Postop check    Controlled Controlled Controlled   Updated Problems: Problem  Nephrolithiasis  Personal History of Surgery to Heart and Great Vessels, Presenting Hazards to Health    Plan of Care  Problem-specific:  Assessment and Plan            Mr. Kiernan Bartlette. has a current medication list which includes the following long-term medication(s): atorvastatin , imipramine , and pantoprazole .  Pharmacotherapy (Medications Ordered): No orders of the defined types were placed in this encounter.  Orders:  No orders of the defined types were placed in this encounter.  Follow-up plan:   No follow-ups on file.     Interventional Therapies  Risk Factors  Considerations  Medical Comorbidities:  Plavix  Anticoagulation: (Stop: 7-10 days  Restart: 2 hours)  CAD  Hx. NSTEMI  S/P CABG x5  Stage 3b CKD  T2NIDDM     Planned  Pending:   Therapeutic left Racz procedure there #1 (06/12/2023)    Under consideration:   Possible Racz procedure    Completed:   Diagnostic/therapeutic left caudal ESI + diagnostic  epidurogram x1 (05/08/2023) (100/100/50/50)   Therapeutic  Palliative (PRN) options:   None established   Completed by other providers:   Surgery: Right L2-3 lateral transpsoas interbody fusion w/ lateral plating and L2-3 decompression (07/30/2016) by Noland Battles, MD (Neurosurgery)  Surgery: Left L3-4 and L4-5 anterolateral fusion with interbody spacer and right L3-4 and L4-5 pedicle screws (03/13/2010) by Tivis Forster, MD (Neurosurgery)  [facility] Procedures Date Name LateralityProvider Name and Address 12/03/2022 G-RDR SNRB                                             Adelaide Adjutant, MD 01/17/2022 G-RDR SNRB 2 Level                                          Adelaide Adjutant, MD 07/03/2021 G-RDR SNRB 2 Level  Jaquelyn Merino 05/01/2021 G-JCB Trigger Point                                 Orvan Blanch, MD 12/04/2020 G-RDR SNRB 2 Level                      Adelaide Adjutant, MD 07/18/2020 G-RDR [place] Ha                                              Adelaide Adjutant, MD 01/17/2020 Zena High Lumbar TFESIK U1 Level  Fletcher Humble, MD 09/25/2018 Lumbar Laminectomy    09/20/2018 LAMINOTOMY (HEMILAMINECTOMY), DECOMPRESSION OF NERVE ROOTS, PARTIAL FACECTOTOMY, FORAMINOTOMY AND/OR DISC REMOVAL, LUMBAR (SURG) August Blitz 07/30/2016 Lumbar Laminectomy   April Webb 03/07/2011 Lumbar Laminectomy   April Webb 03/06/2001 Lumbar Laminectomy      Recent Visits Date Type Provider Dept  06/12/23 Procedure visit Renaldo Caroli, MD [facility]  05/27/23 Office Visit Renaldo Caroli, MD [facility]  05/08/23 Procedure visit Renaldo Caroli, MD [facility]  04/16/23 Office Visit Renaldo Caroli, MD [facility]  Showing recent visits within past 90 days and meeting all other requirements Future Appointments Date Type Provider Dept  07/09/23 Appointment Renaldo Caroli, MD [facility]  Showing future appointments  within next 90 days and meeting all other requirements  I discussed the assessment and treatment plan with the patient. The patient was provided an opportunity to ask questions and all were answered. The patient agreed with the plan and demonstrated an understanding of the instructions.  Patient advised to call back or seek an in-person evaluation if the symptoms or condition worsens.  Duration of encounter: *** minutes.  Total time on encounter, as per AMA guidelines included both the face-to-face and non-face-to-face time personally spent by the physician and/or other qualified health care professional(s) on the day of the encounter (includes time in activities that require the physician or other qualified health care professional and does not include time in activities normally performed by clinical staff). Physician's time may include the following activities when performed: Preparing to see the patient (e.g., pre-charting review of records, searching for previously ordered imaging, lab work, and nerve conduction tests) Review of prior analgesic pharmacotherapies. Reviewing PMP Interpreting ordered tests (e.g., lab work, imaging, nerve conduction tests) Performing post-procedure evaluations, including interpretation of diagnostic procedures Obtaining and/or reviewing separately obtained history Performing a medically appropriate examination and/or evaluation Counseling and educating the patient/family/caregiver Ordering medications, tests, or procedures Referring and communicating with other health care professionals (when not separately reported) Documenting clinical information in the electronic or other health record Independently interpreting results (not separately reported) and communicating results to the patient/ family/caregiver Care coordination (not separately reported)  Note by: Candi Chafe, MD (TTS and AI technology used. I apologize for any typographical errors that  were not detected and corrected.) Date: 07/09/2023; Time: 7:30 AM

## 2023-07-08 NOTE — Progress Notes (Signed)
 Subjective:   Rodney Hall. is a 78 y.o. who presents for a Medicare Wellness preventive visit.  Visit Complete: In person  Persons Participating in Visit: Patient.  AWV Questionnaire: No: Patient Medicare AWV questionnaire was not completed prior to this visit.  Cardiac Risk Factors include: advanced age (>72men, >24 women);dyslipidemia;male gender;hypertension     Objective:    Today's Vitals   07/08/23 0959 07/08/23 1000  BP: 118/70   Weight: 194 lb 8 oz (88.2 kg)   Height: 5\' 10"  (1.778 m)   PainSc:  4    Body mass index is 27.91 kg/m.     07/08/2023   10:08 AM 06/27/2023    9:36 AM 06/12/2023   10:46 AM 05/29/2023    5:35 PM 05/27/2023    2:05 PM 05/08/2023    9:43 AM 04/16/2023    2:30 PM  Advanced Directives  Does Patient Have a Medical Advance Directive? No No No No No No No  Would patient like information on creating a medical advance directive? No - Patient declined No - Patient declined No - Patient declined   No - Patient declined     Current Medications (verified) Outpatient Encounter Medications as of 07/08/2023  Medication Sig   acetaminophen  (TYLENOL ) 325 MG tablet Take 650 mg by mouth every 6 (six) hours as needed for moderate pain or headache.   aspirin  EC 81 MG tablet Take 1 tablet (81 mg total) by mouth daily. Swallow whole.   busPIRone  (BUSPAR ) 7.5 MG tablet Start 1/2 tablet daily for 6 days then increase to 1/2 tablet twice a day for 6 days. Then 1 tablet twice days   clopidogrel  (PLAVIX ) 75 MG tablet Take 1 tablet (75 mg total) by mouth daily.   imipramine  (TOFRANIL ) 50 MG tablet Take 1 tablet (50 mg total) by mouth at bedtime.   omeprazole  (PRILOSEC) 20 MG capsule Take 20 mg by mouth 2 (two) times daily.   oxyCODONE -acetaminophen  (PERCOCET/ROXICET) 5-325 MG tablet Take 1 tablet by mouth every 4 (four) hours as needed for severe pain (pain score 7-10).   tamsulosin  (FLOMAX ) 0.4 MG CAPS capsule Take 1 capsule (0.4 mg total) by mouth daily after  supper.   atorvastatin  (LIPITOR) 10 MG tablet Take 1 tablet (10 mg total) by mouth daily.   pantoprazole  (PROTONIX ) 40 MG tablet Take 1 tablet (40 mg total) by mouth daily for 14 days. (Patient not taking: Reported on 06/12/2023)   No facility-administered encounter medications on file as of 07/08/2023.    Allergies (verified) Pravastatin  and Amoxicillin   History: Past Medical History:  Diagnosis Date   Abdominal mass    Abdominal pain 06/02/2017   Anxiety    Pt. denies   Arthritis    pt. 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   left above alar crease sidewall of nose/in situ   Past Surgical History:  Procedure Laterality Date   ANTERIOR LAT LUMBAR FUSION Right 07/30/2016   Procedure: Right Lateral two-three Lateral transpsoas interbody fusion with lateral plating/Minimally invasive decompression at Lumbar two-three;  Surgeon: Ditty, Raelene Bullocks, MD;  Location: [facility] OR;  Service: Neurosurgery;  Laterality: Right;   BACK SURGERY  2012   CATARACT EXTRACTION, BILATERAL     CHOLECYSTECTOMY N/A 07/25/2017   Procedure: LAPAROSCOPIC CHOLECYSTECTOMY WITH INTRAOPERATIVE CHOLANGIOGRAM;  Surgeon: Jacolyn Matar, MD;  Location: [facility] ORS;  Service: General;  Laterality: N/A;   COLONOSCOPY WITH PROPOFOL  N/A 12/12/2017   Procedure: COLONOSCOPY WITH PROPOFOL ;  Surgeon: Cassie Click, MD;  Location: [facility] ENDOSCOPY;  Service: Endoscopy;  Laterality: N/A;   COLONOSCOPY WITH PROPOFOL  N/A 06/27/2023   Procedure: COLONOSCOPY WITH PROPOFOL ;  Surgeon: Shane Darling, MD;  Location: [facility] ENDOSCOPY;  Service:  Endoscopy;  Laterality: N/A;   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   Procedure: CORONARY ARTERY BYPASS GRAFTING (CABG) TIMES FIVE, USING LEFT INTERNAL MAMMARY ARTERY AND RIGHT GREATER SAPHENOUS VEIN HARVESTED ENDOSCOPICALLY;  Surgeon: Bartley Lightning, MD;  Location: [facility] OR;  Service: Open Heart Surgery;  Laterality: N/A;   EUS N/A 07/10/2017   Procedure: UPPER ENDOSCOPIC ULTRASOUND (EUS) LINEAR;  Surgeon: Janel Medford, MD;  Location: [facility] ENDOSCOPY;  Service: Endoscopy;  Laterality: N/A;   EYE SURGERY     HEMI-MICRODISCECTOMY LUMBAR LAMINECTOMY LEVEL 1  09/2018   [facility]   IABP INSERTION N/A 09/06/2022   Procedure: IABP Insertion;  Surgeon: Swaziland, Peter M, MD;  Location: [facility] INVASIVE CV LAB;  Service: Cardiovascular;  Laterality: N/A;   LEFT HEART CATH AND CORONARY ANGIOGRAPHY N/A 09/04/2022   Procedure: LEFT HEART CATH AND CORONARY ANGIOGRAPHY;  Surgeon: Sammy Crisp, MD;  Location: [facility] INVASIVE CV LAB;  Service: Cardiovascular;  Laterality: N/A;   LEFT HEART CATH AND CORONARY ANGIOGRAPHY N/A 09/06/2022   Procedure: LEFT HEART CATH AND CORONARY ANGIOGRAPHY;  Surgeon: Swaziland, Peter M, MD;  Location: [facility] INVASIVE CV LAB;  Service: Cardiovascular;  Laterality: N/A;   NECK SURGERY  2000   POLYPECTOMY  06/27/2023   Procedure: POLYPECTOMY, INTESTINE;  Surgeon: Shane Darling, MD;  Location: [facility] ENDOSCOPY;  Service: Endoscopy;;   TEE WITHOUT CARDIOVERSION N/A 09/07/2022   Procedure: TRANSESOPHAGEAL ECHOCARDIOGRAM;  Surgeon: Bartley Lightning, MD;  Location: [facility] OR;  Service: Open Heart Surgery;  Laterality: N/A;   TENNIS ELBOW RELEASE/NIRSCHEL PROCEDURE     TONSILLECTOMY AND ADENOIDECTOMY     Family History  Problem Relation Age of Onset   Cancer Mother        brain   Diabetes Father    Rheumatic fever Sister    Heart disease Sister    Diabetes Brother    Social History   Socioeconomic History   Marital status: Married    Spouse name: Not on file   Number  of children: 2   Years of education: Not on file   Highest education level: 12th grade  Occupational History   Not on file  Tobacco Use   Smoking status: Never    Passive exposure: Never   Smokeless tobacco: Never  Vaping Use   Vaping status: Never Used  Substance and Sexual Activity   Alcohol use: No    Alcohol/week: 0.0 standard drinks of alcohol   Drug use: No   Sexual activity: Yes  Other Topics Concern   Not on file  Social History Narrative   Not on file   Social Drivers of Health   Financial Resource Strain: Low Risk  (07/08/2023)   Overall Financial Resource Strain (CARDIA)    Difficulty of Paying Living Expenses: Not hard at all  Food Insecurity: No Food Insecurity (07/08/2023)  Hunger Vital Sign    Worried About Running Out of Food in the Last Year: Never true    Ran Out of Food in the Last Year: Never true  Transportation Needs: No Transportation Needs (07/08/2023)   PRAPARE - Administrator, Civil Service (Medical): No    Lack of Transportation (Non-Medical): No  Physical Activity: Sufficiently Active (07/08/2023)   Exercise Vital Sign    Days of Exercise per Week: 6 days    Minutes of Exercise per Session: 60 min  Stress: No Stress Concern Present (07/08/2023)   [employer] of Occupational Health - Occupational Stress Questionnaire    Feeling of Stress : Only a little  Social Connections: Moderately Isolated (07/08/2023)   Social Connection and Isolation Panel [NHANES]    Frequency of Communication with Friends and Family: Three times a week    Frequency of Social Gatherings with Friends and Family: Three times a week    Attends Religious Services: Never    Active Member of Clubs or Organizations: No    Attends Engineer, structural: Never    Marital Status: Married    Tobacco Counseling Counseling given: Not Answered    Clinical Intake:  Pre-visit preparation completed: Yes  Pain : 0-10 Pain Score: 4  Pain Type: Chronic  pain Pain Location: Back Pain Orientation: Lower Pain Descriptors / Indicators: Aching, Discomfort, Constant Pain Onset: More than a month ago Pain Frequency: Constant Pain Relieving Factors: pain mgmt  Pain Relieving Factors: pain mgmt  BMI - recorded: 27.91 Nutritional Status: BMI 25 -29 Overweight Nutritional Risks: None Diabetes: No  Lab Results  Component Value Date   HGBA1C 6.0 (A) 04/14/2023   HGBA1C 5.7 (H) 09/06/2022   HGBA1C 6.3 (H) 02/22/2022     How often do you need to have someone help you when you read instructions, pamphlets, or other written materials from your doctor or pharmacy?: 1 - Never  Interpreter Needed?: No  Information entered by :: Charmin Aguiniga LPN   Activities of Daily Living    07/08/2023   10:10 AM 10/11/2022    1:24 PM  In your present state of health, do you have any difficulty performing the following activities:  Hearing? 1 1  Vision? 0 0  Difficulty concentrating or making decisions? 0 0  Walking or climbing stairs? 1 1  Comment BACK PAIN   Dressing or bathing? 0 0  Doing errands, shopping? 0 0  Preparing Food and eating ? N   Using the Toilet? N   In the past six months, have you accidently leaked urine? N   Do you have problems with loss of bowel control? N   Managing your Medications? N   Managing your Finances? N   Housekeeping or managing your Housekeeping? N     Patient Care Team: Lamon Pillow, MD as PCP - General (Family Medicine) Devorah Fonder, MD as PCP - Cardiology (Cardiology) Rea Cambridge, MD as Consulting Physician (Urology) Orvan Blanch, MD as Consulting Physician (Orthopedic Surgery) [facility], [facility] (Ophthalmology)  Indicate any recent Medical Services you may have received from other than [facility] providers in the past year (date may be approximate).     Assessment:   This is a routine wellness examination for [facility]  Hearing/Vision screen Hearing Screening - Comments:: WEARS AIDS, BOTH  EARS Vision Screening - Comments:: READERS, HAD CATARACT SGY @ 78YO- [facility]   Goals Addressed  This Visit's Progress    DIET - INCREASE WATER  INTAKE         Depression Screen     07/08/2023   10:07 AM 06/12/2023   10:46 AM 05/20/2023   10:19 AM 05/08/2023    9:43 AM 04/16/2023    2:30 PM 04/02/2023    2:18 PM 10/14/2022    4:58 PM  PHQ 2/9 Scores  PHQ - 2 Score 0 0 0 0 0 0 0  PHQ- 9 Score 0      1    Fall Risk     07/08/2023   10:09 AM 06/12/2023   10:46 AM 05/08/2023    9:43 AM 04/16/2023    2:30 PM 04/02/2023    2:17 PM  Fall Risk   Falls in the past year? 0 0 1 1 1   Number falls in past yr: 0 0 0 1 1  Injury with Fall? 0 0 0 0 0  Risk for fall due to : No Fall Risks    History of fall(s)  Follow up Falls prevention discussed;Falls evaluation completed        MEDICARE RISK AT HOME:  Medicare Risk at Home Any stairs in or around the home?: Yes If so, are there any without handrails?: No Home free of loose throw rugs in walkways, pet beds, electrical cords, etc?: Yes Adequate lighting in your home to reduce risk of falls?: Yes Life alert?: No Use of a cane, walker or w/c?: No Grab bars in the bathroom?: Yes Shower chair or bench in shower?: No Elevated toilet seat or a handicapped toilet?: Yes  TIMED UP AND GO:  Was the test performed?  Yes  Length of time to ambulate 10 feet: 4 sec Gait steady and fast without use of assistive device  Cognitive Function: 6CIT completed        07/08/2023   10:11 AM 07/03/2022   11:25 AM 05/26/2020    2:04 PM  6CIT Screen  What Year? 0 points 0 points 0 points  What month? 0 points 0 points 0 points  What time? 0 points 0 points 0 points  Count back from 20 0 points 0 points 0 points  Months in reverse 0 points 0 points 0 points  Repeat phrase 0 points 0 points 4 points  Total Score 0 points 0 points 4 points    Immunizations Immunization History  Administered Date(s) Administered   Fluad Quad(high Dose  65+) 11/30/2018, 12/20/2019   Influenza, High Dose Seasonal PF 12/26/2016, 12/23/2017, 12/06/2020   Influenza-Unspecified 12/03/2014   Pneumococcal Conjugate-13 03/09/2014   Pneumococcal Polysaccharide-23 04/06/2015   Tdap 04/06/2015   Zoster Recombinant(Shingrix) 01/14/2019, 03/16/2019   Zoster, Live 03/05/2011    Screening Tests Health Maintenance  Topic Date Due   COVID-19 Vaccine (1) Never done   FOOT EXAM  Never done   OPHTHALMOLOGY EXAM  Never done   Diabetic kidney evaluation - Urine ACR  Never done   INFLUENZA VACCINE  10/03/2023   HEMOGLOBIN A1C  10/12/2023   Diabetic kidney evaluation - eGFR measurement  05/28/2024   Medicare Annual Wellness (AWV)  07/07/2024   DTaP/Tdap/Td (2 - Td or Tdap) 04/05/2025   Pneumonia Vaccine 51+ Years old  Completed   Hepatitis C Screening  Completed   Zoster Vaccines- Shingrix  Completed   HPV VACCINES  Aged Out   Meningococcal B Vaccine  Aged Out   Colonoscopy  Discontinued    Health Maintenance  Health Maintenance Due  Topic Date Due  COVID-19 Vaccine (1) Never done   FOOT EXAM  Never done   OPHTHALMOLOGY EXAM  Never done   Diabetic kidney evaluation - Urine ACR  Never done   Health Maintenance Items Addressed: UP TO DATE W/ SHOTS, NEEDS EYE EXAM  Additional Screening:  Vision Screening: Recommended annual ophthalmology exams for early detection of glaucoma and other disorders of the eye.  Dental Screening: Recommended annual dental exams for proper oral hygiene  Community Resource Referral / Chronic Care Management: CRR required this visit?  No   CCM required this visit?  No     Plan:     I have personally reviewed and noted the following in the patient's chart:   Medical and social history Use of alcohol, tobacco or illicit drugs  Current medications and supplements including opioid prescriptions. Patient is currently taking opioid prescriptions. Information provided to patient regarding non-opioid  alternatives. Patient advised to discuss non-opioid treatment plan with their provider. Functional ability and status Nutritional status Physical activity Advanced directives List of other physicians Hospitalizations, surgeries, and ER visits in previous 12 months Vitals Screenings to include cognitive, depression, and falls Referrals and appointments  In addition, I have reviewed and discussed with patient certain preventive protocols, quality metrics, and best practice recommendations. A written personalized care plan for preventive services as well as general preventive health recommendations were provided to patient.     Pinky Bright, LPN   4/0/9811   After Visit Summary: (In Person-Declined) Patient declined AVS at this time.  Notes: Nothing significant to report at this time.

## 2023-07-08 NOTE — Patient Instructions (Addendum)
 Rodney Hall , Thank you for taking time to come for your Medicare Wellness Visit. I appreciate your ongoing commitment to your health goals. Please review the following plan we discussed and let me know if I can assist you in the future.   Referrals/Orders/Follow-Ups/Clinician Recommendations: none, need eye exam  This is a list of the screening recommended for you and due dates:  Health Maintenance  Topic Date Due   COVID-19 Vaccine (1) Never done   Complete foot exam   Never done   Eye exam for diabetics  Never done   Yearly kidney health urinalysis for diabetes  Never done   Flu Shot  10/03/2023   Hemoglobin A1C  10/12/2023   Yearly kidney function blood test for diabetes  05/28/2024   Medicare Annual Wellness Visit  07/07/2024   DTaP/Tdap/Td vaccine (2 - Td or Tdap) 04/05/2025   Pneumonia Vaccine  Completed   Hepatitis C Screening  Completed   Zoster (Shingles) Vaccine  Completed   HPV Vaccine  Aged Out   Meningitis B Vaccine  Aged Out   Colon Cancer Screening  Discontinued    Advanced directives: (ACP Link)Information on Advanced Care Planning can be found at [place]  Secretary of [facility] Advance Health Care Directives Advance Health Care Directives. [email or web address]   Next Medicare Annual Wellness Visit scheduled for next year: Yes   07/14/24 @ 9:30 am in person  Have you seen your provider in the last 6 months (3 months if uncontrolled diabetes)? Yes

## 2023-07-09 ENCOUNTER — Encounter: Payer: Self-pay | Admitting: Pain Medicine

## 2023-07-09 ENCOUNTER — Ambulatory Visit: Attending: Pain Medicine | Admitting: Pain Medicine

## 2023-07-09 VITALS — BP 114/66 | HR 84 | Temp 97.7°F | Resp 16 | Ht 71.0 in | Wt 190.0 lb

## 2023-07-09 DIAGNOSIS — Z09 Encounter for follow-up examination after completed treatment for conditions other than malignant neoplasm: Secondary | ICD-10-CM | POA: Diagnosis present

## 2023-07-09 DIAGNOSIS — M47817 Spondylosis without myelopathy or radiculopathy, lumbosacral region: Secondary | ICD-10-CM | POA: Insufficient documentation

## 2023-07-09 DIAGNOSIS — Z7901 Long term (current) use of anticoagulants: Secondary | ICD-10-CM | POA: Diagnosis present

## 2023-07-09 DIAGNOSIS — M79605 Pain in left leg: Secondary | ICD-10-CM | POA: Insufficient documentation

## 2023-07-09 DIAGNOSIS — M5459 Other low back pain: Secondary | ICD-10-CM | POA: Diagnosis not present

## 2023-07-09 DIAGNOSIS — M4727 Other spondylosis with radiculopathy, lumbosacral region: Secondary | ICD-10-CM | POA: Insufficient documentation

## 2023-07-09 DIAGNOSIS — M545 Low back pain, unspecified: Secondary | ICD-10-CM | POA: Insufficient documentation

## 2023-07-09 DIAGNOSIS — M5417 Radiculopathy, lumbosacral region: Secondary | ICD-10-CM | POA: Diagnosis not present

## 2023-07-09 DIAGNOSIS — M47816 Spondylosis without myelopathy or radiculopathy, lumbar region: Secondary | ICD-10-CM | POA: Diagnosis present

## 2023-07-09 DIAGNOSIS — M961 Postlaminectomy syndrome, not elsewhere classified: Secondary | ICD-10-CM | POA: Diagnosis present

## 2023-07-09 DIAGNOSIS — R937 Abnormal findings on diagnostic imaging of other parts of musculoskeletal system: Secondary | ICD-10-CM | POA: Diagnosis present

## 2023-07-09 DIAGNOSIS — M5136 Other intervertebral disc degeneration, lumbar region with discogenic back pain only: Secondary | ICD-10-CM | POA: Diagnosis present

## 2023-07-09 DIAGNOSIS — G96198 Other disorders of meninges, not elsewhere classified: Secondary | ICD-10-CM | POA: Insufficient documentation

## 2023-07-09 DIAGNOSIS — G8929 Other chronic pain: Secondary | ICD-10-CM | POA: Insufficient documentation

## 2023-07-09 NOTE — Patient Instructions (Addendum)
 Stop Plavix  for 7 days prior to procedure     reparing for your procedure (without sedation) Instructions: Oral Intake: Do not eat or drink anything for at least 3 hours prior to your procedure. Transportation: Unless otherwise stated by your physician, you may drive yourself after the procedure. Blood Pressure Medicine: Take your blood pressure medicine with a sip of water  the morning of the procedure. Insulin : Take only  of your normal insulin  dose. Preventing infections: Shower with an antibacterial soap the morning of your procedure. Build-up your immune system: Take 1000 mg of Vitamin C with every meal (3 times a day) the day prior to your procedure. Pregnancy: If you are pregnant, call and cancel the procedure. Sickness: If you have a cold, fever, or any active infections, call and cancel the procedure. Arrival: You must be in the facility at least 30 minutes prior to your scheduled procedure. Children: Do not bring any children with you. Dress appropriately: Bring dark clothing that you would not mind if they get stained. Valuables: Do not bring any jewelry or valuables. Procedure appointments are reserved for interventional treatments only. No Prescription Refills. No medication changes will be discussed during procedure appointments. No disability issues will be discussed.    ______________________________________________________________________    Procedure instructions  Stop blood-thinners  Do not eat or drink fluids (other than water ) for 6 hours before your procedure  No water  for 2 hours before your procedure  Take your blood pressure medicine with a sip of water   Arrive 30 minutes before your appointment  If sedation is planned, bring suitable driver. Teddie Favre, [place], & public transportation are NOT APPROVED)  Carefully read the "Preparing for your procedure" detailed instructions  If you have questions call us  at [phone]  Procedure appointments are for  procedures only. NO medication refills or new problem evaluations.   ______________________________________________________________________      ______________________________________________________________________    Preparing for your procedure  Appointments: If you think you may not be able to keep your appointment, call 24-48 hours in advance to cancel. We need time to make it available to others.  Procedure visits are for procedures only. During your procedure appointment there will be: NO Prescription Refills*. NO medication changes or discussions*. NO discussion of disability issues*. NO unrelated pain problem evaluations*. NO evaluations to order other pain procedures*. *These will be addressed at a separate and distinct evaluation encounter on the provider's evaluation schedule and not during procedure days.  Instructions: Food intake: Avoid eating anything solid for at least 8 hours prior to your procedure. Clear liquid intake: You may take clear liquids such as water  up to 2 hours prior to your procedure. (No carbonated drinks. No soda.) Transportation: Unless otherwise stated by your physician, bring a driver. (Driver cannot be a Market researcher, Pharmacist, community, or any other form of public transportation.) Morning Medicines: Except for blood thinners, take all of your other morning medications with a sip of water . Make sure to take your heart and blood pressure medicines. If your blood pressure's lower number is above 100, the case will be rescheduled. Blood thinners: Make sure to stop your blood thinners as instructed.  If you take a blood thinner, but were not instructed to stop it, call our office [phone] and ask to talk to a nurse. Not stopping a blood thinner prior to certain procedures could lead to serious complications. Diabetics on insulin : Notify the staff so that you can be scheduled 1st case in the morning. If your diabetes requires  high dose insulin , take only  of your normal  insulin  dose the morning of the procedure and notify the staff that you have done so. Preventing infections: Shower with an antibacterial soap the morning of your procedure.  Build-up your immune system: Take 1000 mg of Vitamin C with every meal (3 times a day) the day prior to your procedure. Antibiotics: Inform the nursing staff if you are taking any antibiotics or if you have any conditions that may require antibiotics prior to procedures. (Example: recent joint implants)   Pregnancy: If you are pregnant make sure to notify the nursing staff. Not doing so may result in injury to the fetus, including death.  Sickness: If you have a cold, fever, or any active infections, call and cancel or reschedule your procedure. Receiving steroids while having an infection may result in complications. Arrival: You must be in the facility at least 30 minutes prior to your scheduled procedure. Tardiness: Your scheduled time is also the cutoff time. If you do not arrive at least 15 minutes prior to your procedure, you will be rescheduled.  Children: Do not bring any children with you. Make arrangements to keep them home. Dress appropriately: There is always a possibility that your clothing may get soiled. Avoid long dresses. Valuables: Do not bring any jewelry or valuables.  Reasons to call and reschedule or cancel your procedure: (Following these recommendations will minimize the risk of a serious complication.) Surgeries: Avoid having procedures within 2 weeks of any surgery. (Avoid for 2 weeks before or after any surgery). Flu Shots: Avoid having procedures within 2 weeks of a flu shots or . (Avoid for 2 weeks before or after immunizations). Barium: Avoid having a procedure within 7-10 days after having had a radiological study involving the use of radiological contrast. (Myelograms, Barium swallow or enema study). Heart attacks: Avoid any elective procedures or surgeries for the initial 6 months after a  "Myocardial Infarction" (Heart Attack). Blood thinners: It is imperative that you stop these medications before procedures. Let us  know if you if you take any blood thinner.  Infection: Avoid procedures during or within two weeks of an infection (including chest colds or gastrointestinal problems). Symptoms associated with infections include: Localized redness, fever, chills, night sweats or profuse sweating, burning sensation when voiding, cough, congestion, stuffiness, runny nose, sore throat, diarrhea, nausea, vomiting, cold or Flu symptoms, recent or current infections. It is specially important if the infection is over the area that we intend to treat. Heart and lung problems: Symptoms that may suggest an active cardiopulmonary problem include: cough, chest pain, breathing difficulties or shortness of breath, dizziness, ankle swelling, uncontrolled high or unusually low blood pressure, and/or palpitations. If you are experiencing any of these symptoms, cancel your procedure and contact your primary care physician for an evaluation.  Remember:  Regular Business hours are:  Monday to Thursday 8:00 AM to 4:00 PM  Provider's Schedule: Renaldo Caroli, MD:  Procedure days: Tuesday and Thursday 7:30 AM to 4:00 PM  Cephus Collin, MD:  Procedure days: Monday and Wednesday 7:30 AM to 4:00 PM Last  Updated: 02/11/2023 ______________________________________________________________________      ______________________________________________________________________    General Risks and Possible Complications  Patient Responsibilities: It is important that you read this as it is part of your informed consent. It is our duty to inform you of the risks and possible complications associated with treatments offered to you. It is your responsibility as a patient to read this and to ask questions about  anything that is not clear or that you believe was not covered in this document.  Patient's Rights: You  have the right to refuse treatment. You also have the right to change your mind, even after initially having agreed to have the treatment done. However, under this last option, if you wait until the last second to change your mind, you may be charged for the materials used up to that point.  Introduction: Medicine is not an Visual merchandiser. Everything in Medicine, including the lack of treatment(s), carries the potential for danger, harm, or loss (which is by definition: Risk). In Medicine, a complication is a secondary problem, condition, or disease that can aggravate an already existing one. All treatments carry the risk of possible complications. The fact that a side effects or complications occurs, does not imply that the treatment was conducted incorrectly. It must be clearly understood that these can happen even when everything is done following the highest safety standards.  No treatment: You can choose not to proceed with the proposed treatment alternative. The "PRO(s)" would include: avoiding the risk of complications associated with the therapy. The "CON(s)" would include: not getting any of the treatment benefits. These benefits fall under one of three categories: diagnostic; therapeutic; and/or palliative. Diagnostic benefits include: getting information which can ultimately lead to improvement of the disease or symptom(s). Therapeutic benefits are those associated with the successful treatment of the disease. Finally, palliative benefits are those related to the decrease of the primary symptoms, without necessarily curing the condition (example: decreasing the pain from a flare-up of a chronic condition, such as incurable terminal cancer).  General Risks and Complications: These are associated to most interventional treatments. They can occur alone, or in combination. They fall under one of the following six (6) categories: no benefit or worsening of symptoms; bleeding; infection; nerve damage;  allergic reactions; and/or death. No benefits or worsening of symptoms: In Medicine there are no guarantees, only probabilities. No healthcare provider can ever guarantee that a medical treatment will work, they can only state the probability that it may. Furthermore, there is always the possibility that the condition may worsen, either directly, or indirectly, as a consequence of the treatment. Bleeding: This is more common if the patient is taking a blood thinner, either prescription or over the counter (example: Goody Powders, Fish oil, Aspirin , Garlic, etc.), or if suffering a condition associated with impaired coagulation (example: Hemophilia, cirrhosis of the liver, low platelet counts, etc.). However, even if you do not have one on these, it can still happen. If you have any of these conditions, or take one of these drugs, make sure to notify your treating physician. Infection: This is more common in patients with a compromised immune system, either due to disease (example: diabetes, cancer, human immunodeficiency virus [HIV], etc.), or due to medications or treatments (example: therapies used to treat cancer and rheumatological diseases). However, even if you do not have one on these, it can still happen. If you have any of these conditions, or take one of these drugs, make sure to notify your treating physician. Nerve Damage: This is more common when the treatment is an invasive one, but it can also happen with the use of medications, such as those used in the treatment of cancer. The damage can occur to small secondary nerves, or to large primary ones, such as those in the spinal cord and brain. This damage may be temporary or permanent and it may lead to impairments that can range  from temporary numbness to permanent paralysis and/or brain death. Allergic Reactions: Any time a substance or material comes in contact with our body, there is the possibility of an allergic reaction. These can range from  a mild skin rash (contact dermatitis) to a severe systemic reaction (anaphylactic reaction), which can result in death. Death: In general, any medical intervention can result in death, most of the time due to an unforeseen complication. ______________________________________________________________________      ______________________________________________________________________    Blood Thinners  IMPORTANT NOTICE:  If you take any of these, make sure to notify the nursing staff.  Failure to do so may result in serious injury.  Recommended time intervals to stop and restart blood-thinners, before & after invasive procedures  Generic Name Brand Name Pre-procedure: Stop medication for this amount of time before your procedure: Post-procedure: Wait this amount of time after the procedure before restarting your medication:  Abciximab Reopro 15 days 2 hrs  Alteplase Activase 10 days 10 days  Anagrelide Agrylin    Apixaban Eliquis 3 days 6 hrs  Cilostazol Pletal 3 days 5 hrs  Clopidogrel  Plavix  7-10 days 2 hrs  Dabigatran Pradaxa 5 days 6 hrs  Dalteparin Fragmin 24 hours 4 hrs  Dipyridamole Aggrenox 11days 2 hrs  Edoxaban Lixiana; Savaysa 3 days 2 hrs  Enoxaparin   Lovenox  24 hours 4 hrs  Eptifibatide Integrillin 8 hours 2 hrs  Fondaparinux  Arixtra 72 hours 12 hrs  Hydroxychloroquine Plaquenil 11 days   Prasugrel Effient 7-10 days 6 hrs  Reteplase Retavase 10 days 10 days  Rivaroxaban Xarelto 3 days 6 hrs  Ticagrelor Brilinta 5-7 days 6 hrs  Ticlopidine Ticlid 10-14 days 2 hrs  Tinzaparin Innohep 24 hours 4 hrs  Tirofiban Aggrastat 8 hours 2 hrs  Warfarin Coumadin 5 days 2 hrs   Other medications with blood-thinning effects  NOTE: Consider stopping these if you have prolonged bleeding despite not taking any of the above blood thinners. Otherwise ask your provider and this will be decided on a case-by-case basis.  Product indications Generic (Brand) names Note  Cholesterol  Lipitor Stop 4 days before procedure  Blood thinner (injectable) Heparin  (LMW or LMWH Heparin ) Stop 24 hours before procedure  Cancer Ibrutinib (Imbruvica) Stop 7 days before procedure  Malaria/Rheumatoid Hydroxychloroquine (Plaquenil) Stop 11 days before procedure  Thrombolytics  10 days before or after procedures   Over-the-counter (OTC) Products with blood-thinning effects  NOTE: Consider stopping these if you have prolonged bleeding despite not taking any of the above blood thinners. Otherwise ask your provider and this will be decided on a case-by-case basis.  Product Common names Stop Time  Aspirin  > 325 mg Goody Powders, Excedrin, etc. 11 days  Aspirin  <= 81 mg  7 days  Fish oil  4 days  Garlic supplements  7 days  Ginkgo biloba  36 hours  Ginseng  24 hours  NSAIDs Ibuprofen, Naprosyn, etc. 3 days  Vitamin E  4 days   ______________________________________________________________________

## 2023-07-14 ENCOUNTER — Encounter: Payer: Self-pay | Admitting: Family Medicine

## 2023-07-14 ENCOUNTER — Ambulatory Visit (INDEPENDENT_AMBULATORY_CARE_PROVIDER_SITE_OTHER): Admitting: Family Medicine

## 2023-07-14 VITALS — BP 102/57 | HR 98 | Resp 16 | Ht 70.0 in | Wt 191.8 lb

## 2023-07-14 DIAGNOSIS — G8929 Other chronic pain: Secondary | ICD-10-CM | POA: Diagnosis not present

## 2023-07-14 DIAGNOSIS — M545 Low back pain, unspecified: Secondary | ICD-10-CM

## 2023-07-14 DIAGNOSIS — F419 Anxiety disorder, unspecified: Secondary | ICD-10-CM | POA: Insufficient documentation

## 2023-07-14 DIAGNOSIS — D538 Other specified nutritional anemias: Secondary | ICD-10-CM

## 2023-07-14 DIAGNOSIS — G2581 Restless legs syndrome: Secondary | ICD-10-CM

## 2023-07-14 DIAGNOSIS — R197 Diarrhea, unspecified: Secondary | ICD-10-CM

## 2023-07-14 DIAGNOSIS — R112 Nausea with vomiting, unspecified: Secondary | ICD-10-CM | POA: Diagnosis not present

## 2023-07-14 MED ORDER — OXYCODONE-ACETAMINOPHEN 5-325 MG PO TABS: 1.0000 | ORAL_TABLET | ORAL | 0 refills | Status: DC | PRN

## 2023-07-14 MED ORDER — BUSPIRONE HCL 7.5 MG PO TABS: ORAL_TABLET | ORAL | Status: DC

## 2023-07-14 NOTE — Progress Notes (Signed)
 Established patient visit   Patient: Rodney Hall.   DOB: May 26, 1945   78 y.o. Male  MRN: [id number] Visit Date: 07/14/2023  Today's healthcare provider: Jeralene Mom, MD   Chief Complaint  Patient presents with   Discuss Diverticulitis    Patient reports having some nausea, vomiting, diarrhea   Subjective    Discussed the use of AI scribe software for clinical note transcription with the patient, who gave verbal consent to proceed.  History of Present Illness   Rodney Marczak. is a 78 year old male who presents with gastrointestinal symptoms and back pain.  He has been experiencing cramps, diarrhea, and vomiting for several days. Pepto-Bismol provides some relief, though not consistently, and is taken approximately two out of every three days. These symptoms began before a recent colonoscopy, during which five polyps were removed. Since the procedure, he has had minimal bleeding, with only one bowel movement containing blood. No specific dietary triggers for his gastrointestinal symptoms have been identified.  He has a history of chronic back pain, having undergone four surgeries and physical therapy. Post-heart attack consultations with two surgeons revealed limited surgical options. He experiences significant nerve-related discomfort, particularly at night, disrupting his sleep. Initially, buspirone  provided some relief, but its effectiveness has decreased. He feels restless in the afternoon and at night, describing a need to 'get up and run,' though back pain limits his mobility. Oxycodone , taken approximately three times a day, temporarily alleviates his symptoms.  He has adjusted his diet following a consultation with a kidney specialist, resulting in improved kidney function. He is concerned about the impact of his back pain and nerve issues on his daily activities, expressing a desire to remain active despite the discomfort.     Lab Results  Component Value Date    WBC 16.2 (H) 05/29/2023   HGB 12.9 (L) 05/29/2023   HCT 41.6 05/29/2023   MCV 83.9 05/29/2023   PLT 320 05/29/2023     Medications: Outpatient Medications Prior to Visit  Medication Sig   acetaminophen  (TYLENOL ) 325 MG tablet Take 650 mg by mouth every 6 (six) hours as needed for moderate pain or headache.   aspirin  EC 81 MG tablet Take 1 tablet (81 mg total) by mouth daily. Swallow whole.   busPIRone  (BUSPAR ) 7.5 MG tablet Start 1/2 tablet daily for 6 days then increase to 1/2 tablet twice a day for 6 days. Then 1 tablet twice days   clopidogrel  (PLAVIX ) 75 MG tablet Take 1 tablet (75 mg total) by mouth daily.   imipramine  (TOFRANIL ) 50 MG tablet Take 1 tablet (50 mg total) by mouth at bedtime.   omeprazole  (PRILOSEC) 20 MG capsule Take 20 mg by mouth 2 (two) times daily.   oxyCODONE -acetaminophen  (PERCOCET/ROXICET) 5-325 MG tablet Take 1 tablet by mouth every 4 (four) hours as needed for severe pain (pain score 7-10).   tamsulosin  (FLOMAX ) 0.4 MG CAPS capsule Take 1 capsule (0.4 mg total) by mouth daily after supper.   atorvastatin  (LIPITOR) 10 MG tablet Take 1 tablet (10 mg total) by mouth daily.   pantoprazole  (PROTONIX ) 40 MG tablet Take 1 tablet (40 mg total) by mouth daily for 14 days. (Patient not taking: Reported on 06/12/2023)   No facility-administered medications prior to visit.      Objective    BP (!) 102/57 (BP Location: Left Arm, Patient Position: Sitting, Cuff Size: Normal)   Pulse 98   Resp 16   Ht 5\' 10"  (  1.778 m)   Wt 191 lb 12.8 oz (87 kg)   SpO2 96%   BMI 27.52 kg/m   Physical Exam  General appearance: Well developed, well nourished male, cooperative and in no acute distress Head: Normocephalic, without obvious abnormality, atraumatic Respiratory: Respirations even and unlabored, normal respiratory rate Extremities: All extremities are intact.  Skin: Skin color, texture, turgor normal. No rashes seen  Psych: Appropriate mood and affect. Neurologic:  Mental status: Alert, oriented to person, place, and time, thought content appropriate.    Assessment & Plan        Back pain Chronic back pain with nerve-related symptoms, managed with oxycodone . Possible restless legs syndrome contributing to discomfort. - Refill oxycodone  prescription before current supply runs out. - Continue current pain management regimen. - Consider increasing buspirone  dosage to two tablets at night.  Chronic pain management Ongoing management with oxycodone  under pain management clinic care. - Refill oxycodone  prescription as needed. - Continue follow-up with pain management clinic.  Restless legs syndrome Symptoms suggestive of restless legs syndrome. Differential includes iron or B12 deficiency, or thyroid  disorder. - Order lab tests to check for iron deficiency, B12 levels, and thyroid  function. - Increase buspirone  dosage to two tablets at night. - Review lab results in two days and adjust treatment plan accordingly.  Diarrhea and vomiting Intermittent symptoms possibly related to recent colonoscopy and polyp removal, managed with Pepto-Bismol. - Continue using Pepto-Bismol as needed for symptom relief.         Jeralene Mom, MD  [facility] [phone] (phone) [phone] (fax)  [facility]

## 2023-07-14 NOTE — Progress Notes (Signed)
 I have reviewed the health advisor's note, was available for consultation, and agree with documentation and plan  Mila Merry, MD

## 2023-07-15 DIAGNOSIS — D538 Other specified nutritional anemias: Secondary | ICD-10-CM | POA: Diagnosis not present

## 2023-07-15 DIAGNOSIS — G2581 Restless legs syndrome: Secondary | ICD-10-CM | POA: Diagnosis not present

## 2023-07-16 ENCOUNTER — Ambulatory Visit: Payer: Self-pay | Admitting: Family Medicine

## 2023-07-16 DIAGNOSIS — E611 Iron deficiency: Secondary | ICD-10-CM | POA: Insufficient documentation

## 2023-07-16 LAB — IRON,TIBC AND FERRITIN PANEL
Ferritin: 21 ng/mL — ABNORMAL LOW (ref 30–400)
Iron Saturation: 6 % — CL (ref 15–55)
Iron: 26 ug/dL — ABNORMAL LOW (ref 38–169)
Total Iron Binding Capacity: 415 ug/dL (ref 250–450)
UIBC: 389 ug/dL — ABNORMAL HIGH (ref 111–343)

## 2023-07-16 LAB — VITAMIN B12: Vitamin B-12: 306 pg/mL (ref 232–1245)

## 2023-07-16 LAB — TSH: TSH: 2.25 u[IU]/mL (ref 0.450–4.500)

## 2023-07-17 ENCOUNTER — Other Ambulatory Visit: Payer: Self-pay | Admitting: Family Medicine

## 2023-07-17 DIAGNOSIS — F419 Anxiety disorder, unspecified: Secondary | ICD-10-CM

## 2023-07-17 NOTE — Telephone Encounter (Unsigned)
 Copied from CRM [id number]. Topic: Clinical - Medication Refill >> Jul 17, 2023  9:56 AM Star East wrote: Medication: busPIRone  (BUSPAR ) 7.5 MG tablet  Has the patient contacted their pharmacy? Yes (Agent: If no, request that the patient contact the pharmacy for the refill. If patient does not wish to contact the pharmacy document the reason why and proceed with request.) (Agent: If yes, when and what did the pharmacy advise?)  This is the patient's preferred pharmacy:  [facility] [address], [place] - [address] [place] [address] Phone: [phone] Fax: [phone]  Is this the correct pharmacy for this prescription? Yes If no, delete pharmacy and type the correct one.   Has the prescription been filled recently? Yes  Is the patient out of the medication? Yes  Has the patient been seen for an appointment in the last year OR does the patient have an upcoming appointment? Yes  Can we respond through MyChart? Yes  Agent: Please be advised that Rx refills may take up to 3 business days. We ask that you follow-up with your pharmacy.

## 2023-07-18 MED ORDER — BUSPIRONE HCL 7.5 MG PO TABS: ORAL_TABLET | ORAL | 2 refills | Status: DC

## 2023-07-18 NOTE — Telephone Encounter (Signed)
 Requested Prescriptions  Pending Prescriptions Disp Refills   busPIRone  (BUSPAR ) 7.5 MG tablet 90 tablet     Sig: Take 1 tablet (7.5 mg total) by mouth every morning AND 2 tablets (15 mg total) every evening. Take 1 tablet (7.5 mg total) by mouth every morning AND 2 tablets (15 mg total) every evening.Aaron Aas     Psychiatry: Anxiolytics/Hypnotics - Non-controlled Passed - 07/18/2023  3:37 PM      Passed - Valid encounter within last 12 months    Recent Outpatient Visits           4 days ago Restless leg syndrome   [facility] Lamon Pillow, MD   1 month ago Anxiety   [facility] Lamon Pillow, MD   3 months ago Prediabetes   [facility] Lamon Pillow, MD       Future Appointments             In 1 month Elta Halter, MD [facility]   In 3 months Fisher, Erlinda Haws, MD [facility]   In 4 months Estanislao Heimlich, Dennard Fisher, MD [facility] [place]

## 2023-07-22 ENCOUNTER — Encounter: Payer: Self-pay | Admitting: Pain Medicine

## 2023-07-22 ENCOUNTER — Ambulatory Visit: Attending: Pain Medicine | Admitting: Pain Medicine

## 2023-07-22 ENCOUNTER — Ambulatory Visit
Admission: RE | Admit: 2023-07-22 | Discharge: 2023-07-22 | Disposition: A | Discharge status: Home / Self Care | Source: Ambulatory Visit | Attending: Pain Medicine | Admitting: Pain Medicine

## 2023-07-22 VITALS — BP 146/94 | HR 82 | Temp 97.6°F | Resp 18 | Ht 70.0 in | Wt 185.0 lb

## 2023-07-22 DIAGNOSIS — M47816 Spondylosis without myelopathy or radiculopathy, lumbar region: Secondary | ICD-10-CM | POA: Insufficient documentation

## 2023-07-22 DIAGNOSIS — M5459 Other low back pain: Secondary | ICD-10-CM | POA: Insufficient documentation

## 2023-07-22 DIAGNOSIS — M545 Low back pain, unspecified: Secondary | ICD-10-CM | POA: Diagnosis present

## 2023-07-22 DIAGNOSIS — Z981 Arthrodesis status: Secondary | ICD-10-CM | POA: Insufficient documentation

## 2023-07-22 DIAGNOSIS — M961 Postlaminectomy syndrome, not elsewhere classified: Secondary | ICD-10-CM | POA: Diagnosis not present

## 2023-07-22 DIAGNOSIS — Z7901 Long term (current) use of anticoagulants: Secondary | ICD-10-CM | POA: Insufficient documentation

## 2023-07-22 DIAGNOSIS — M47817 Spondylosis without myelopathy or radiculopathy, lumbosacral region: Secondary | ICD-10-CM | POA: Insufficient documentation

## 2023-07-22 DIAGNOSIS — G8929 Other chronic pain: Secondary | ICD-10-CM | POA: Insufficient documentation

## 2023-07-22 MED ORDER — TRIAMCINOLONE ACETONIDE 40 MG/ML IJ SUSP
INTRAMUSCULAR | Status: AC
  Filled 2023-07-22: qty 2

## 2023-07-22 MED ORDER — PENTAFLUOROPROP-TETRAFLUOROETH EX AERO: INHALATION_SPRAY | Freq: Once | CUTANEOUS | Status: AC

## 2023-07-22 MED ORDER — FENTANYL CITRATE (PF) 100 MCG/2ML IJ SOLN: 25.0000 ug | INTRAMUSCULAR | Status: DC | PRN

## 2023-07-22 MED ORDER — MIDAZOLAM HCL 5 MG/5ML IJ SOLN: 0.5000 mg | Freq: Once | INTRAMUSCULAR | Status: DC

## 2023-07-22 MED ORDER — ROPIVACAINE HCL 2 MG/ML IJ SOLN
18.0000 mL | Freq: Once | INTRAMUSCULAR | Status: AC
  Administered 2023-07-22: 18 mL via PERINEURAL

## 2023-07-22 MED ORDER — TRIAMCINOLONE ACETONIDE 40 MG/ML IJ SUSP
80.0000 mg | Freq: Once | INTRAMUSCULAR | Status: AC
  Administered 2023-07-22: 80 mg

## 2023-07-22 MED ORDER — LIDOCAINE HCL 2 % IJ SOLN
INTRAMUSCULAR | Status: AC
  Filled 2023-07-22: qty 20

## 2023-07-22 MED ORDER — TRIAMCINOLONE ACETONIDE 40 MG/ML IJ SUSP
INTRAMUSCULAR | Status: AC
  Filled 2023-07-22: qty 1

## 2023-07-22 MED ORDER — LIDOCAINE HCL 2 % IJ SOLN
20.0000 mL | Freq: Once | INTRAMUSCULAR | Status: AC
  Administered 2023-07-22: 400 mg

## 2023-07-22 MED ORDER — ROPIVACAINE HCL 2 MG/ML IJ SOLN
INTRAMUSCULAR | Status: AC
  Filled 2023-07-22: qty 20

## 2023-07-22 NOTE — Progress Notes (Signed)
 PROVIDER NOTE: Interpretation of information contained herein should be left to medically-trained personnel. Specific patient instructions are provided elsewhere under "Patient Instructions" section of medical record. This document was created in part using STT-dictation technology, any transcriptional errors that may result from this process are unintentional.  Patient: Rodney Hall. Type: Established DOB: 05/07/1945 MRN: [id number] PCP: Lamon Pillow, MD  Service: Procedure DOS: 07/22/2023 Setting: Ambulatory Location: Ambulatory outpatient facility Delivery: Face-to-face Provider: Candi Chafe, MD Specialty: Interventional Pain Management Specialty designation: 09 Location: Outpatient facility Ref. Prov.: Lamon Pillow, MD       Interventional Therapy   Type: Lumbar Facet, Medial Branch Block(s) (w/ fluoroscopic mapping) #1  Laterality: Bilateral  Level: L2, L3, L4, L5, and S1 Medial Branch Level(s). Injecting these levels blocks the L3-4, L4-5, and L5-S1 lumbar facet joints. Imaging: Fluoroscopic guidance Spinal ([id number]) Anesthesia: Local anesthesia (1-2% Lidocaine ) Anxiolysis: None                 Sedation: No Sedation                       DOS: 07/22/2023 Performed by: Candi Chafe, MD  Primary Purpose: Diagnostic/Therapeutic Indications: Low back pain severe enough to impact quality of life or function. 1. 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 Lumbar facet joint pain   3. Lumbar facet hypertrophy (Multilevel) (Bilateral)   4. Lumbar facet joint syndrome   5. Lumbar post-laminectomy syndrome   6. Mechanical low back pain   7. Multifactorial low back pain   8. Spondylosis without myelopathy or radiculopathy, lumbosacral region   9. Low back pain of over 3 months duration   10. Intractable low back pain   11. History of spinal fusion   12. Chronic anticoagulation (Plavix )    NAS-11 Pain score:   Pre-procedure: 1  /10   Post-procedure: 1 /10     Position / Prep / Materials:  Position: Prone  Prep solution: ChloraPrep (2% chlorhexidine  gluconate and 70% isopropyl alcohol) Area Prepped: Posterolateral Lumbosacral Spine (Wide prep: From the lower border of the scapula down to the end of the tailbone and from flank to flank.)  Materials:  Tray: Block Needle(s):  Type: Spinal  Gauge (G): 22  Length: 3.5-in Qty: 4     H&P (Pre-op Assessment):  Rodney Hall is a 78 y.o. (year old), male patient, seen today for interventional treatment. He  has a past surgical history that includes Back surgery (2012); Neck surgery (2000); Cataract extraction, bilateral; Tonsillectomy and adenoidectomy; Anterior lat lumbar fusion (Right, 07/30/2016); EUS (N/A, 07/10/2017); Tennis elbow release/nirschel procedure; Eye surgery; Cholecystectomy (N/A, 07/25/2017); Colonoscopy with propofol  (N/A, 12/12/2017); Hemi-microdiscectomy lumbar laminectomy level 1 (09/2018); LEFT HEART CATH AND CORONARY ANGIOGRAPHY (N/A, 09/04/2022); Coronary artery bypass graft (N/A, 09/07/2022); TEE without cardioversion (N/A, 09/07/2022); LEFT HEART CATH AND CORONARY ANGIOGRAPHY (N/A, 09/06/2022); IABP Insertion (N/A, 09/06/2022); Colonoscopy with propofol  (N/A, 06/27/2023); and Polypectomy (06/27/2023). Mr. Gentle has a current medication list which includes the following prescription(s): acetaminophen , aspirin  ec, buspirone , clopidogrel , imipramine , omeprazole , oxycodone -acetaminophen , tamsulosin , atorvastatin , and pantoprazole . His primarily concern today is the Back Pain (lower)  Initial Vital Signs:  Pulse/HCG Rate: 82ECG Heart Rate: 93 Temp: 97.6 F (36.4 C) Resp: 16 BP: 111/76 SpO2: 97 %  BMI: Estimated body mass index is 26.54 kg/m as calculated from the following:   Height as of this encounter: 5\' 10"  (1.778 m).   Weight as of this encounter:  185 lb (83.9 kg).  Risk Assessment: Allergies: Reviewed. He is allergic to pravastatin  and  amoxicillin.  Allergy Precautions: None required Coagulopathies: Reviewed. None identified.  Blood-thinner therapy: None at this time Active Infection(s): Reviewed. None identified. [name] is afebrile  Site Confirmation: Rodney Hall was asked to confirm the procedure and laterality before marking the site Procedure checklist: Completed Consent: Before the procedure and under the influence of no sedative(s), amnesic(s), or anxiolytics, the patient was informed of the treatment options, risks and possible complications. To fulfill our ethical and legal obligations, as recommended by the American Medical Association's Code of Ethics, I have informed the patient of my clinical impression; the nature and purpose of the treatment or procedure; the risks, benefits, and possible complications of the intervention; the alternatives, including doing nothing; the risk(s) and benefit(s) of the alternative treatment(s) or procedure(s); and the risk(s) and benefit(s) of doing nothing. The patient was provided information about the general risks and possible complications associated with the procedure. These may include, but are not limited to: failure to achieve desired goals, infection, bleeding, organ or nerve damage, allergic reactions, paralysis, and death. In addition, the patient was informed of those risks and complications associated to Spine-related procedures, such as failure to decrease pain; infection (i.e.: Meningitis, epidural or intraspinal abscess); bleeding (i.e.: epidural hematoma, subarachnoid hemorrhage, or any other type of intraspinal or peri-dural bleeding); organ or nerve damage (i.e.: Any type of peripheral nerve, nerve root, or spinal cord injury) with subsequent damage to sensory, motor, and/or autonomic systems, resulting in permanent pain, numbness, and/or weakness of one or several areas of the body; allergic reactions; (i.e.: anaphylactic reaction); and/or death. Furthermore, the  patient was informed of those risks and complications associated with the medications. These include, but are not limited to: allergic reactions (i.e.: anaphylactic or anaphylactoid reaction(s)); adrenal axis suppression; blood sugar elevation that in diabetics may result in ketoacidosis or comma; water  retention that in patients with history of congestive heart failure may result in shortness of breath, pulmonary edema, and decompensation with resultant heart failure; weight gain; swelling or edema; medication-induced neural toxicity; particulate matter embolism and blood vessel occlusion with resultant organ, and/or nervous system infarction; and/or aseptic necrosis of one or more joints. Finally, the patient was informed that Medicine is not an exact science; therefore, there is also the possibility of unforeseen or unpredictable risks and/or possible complications that may result in a catastrophic outcome. The patient indicated having understood very clearly. We have given the patient no guarantees and we have made no promises. Enough time was given to the patient to ask questions, all of which were answered to the patient's satisfaction. Mr. Colver has indicated that he wanted to continue with the procedure. Attestation: I, the ordering provider, attest that I have discussed with the patient the benefits, risks, side-effects, alternatives, likelihood of achieving goals, and potential problems during recovery for the procedure that I have provided informed consent. Date  Time: 07/22/2023 10:37 AM  Pre-Procedure Preparation:  Monitoring: As per clinic protocol. Respiration, ETCO2, SpO2, BP, heart rate and rhythm monitor placed and checked for adequate function Safety Precautions: Patient was assessed for positional comfort and pressure points before starting the procedure. Time-out: I initiated and conducted the "Time-out" before starting the procedure, as per protocol. The patient was asked to  participate by confirming the accuracy of the "Time Out" information. Verification of the correct person, site, and procedure were performed and confirmed by me, the nursing staff, and the patient. "Time-out"  conducted as per Joint Commission's Universal Protocol (UP.01.01.01). Time: 1130 Start Time: 1130 hrs.  Description of Procedure:          Laterality: (see above) Targeted Levels: (see above)  Safety Precautions: Aspiration looking for blood return was conducted prior to all injections. At no point did we inject any substances, as a needle was being advanced. Before injecting, the patient was told to immediately notify me if he was experiencing any new onset of "ringing in the ears, or metallic taste in the mouth". No attempts were made at seeking any paresthesias. Safe injection practices and needle disposal techniques used. Medications properly checked for expiration dates. SDV (single dose vial) medications used. After the completion of the procedure, all disposable equipment used was discarded in the proper designated medical waste containers. Local Anesthesia: Protocol guidelines were followed. The patient was positioned over the fluoroscopy table. The area was prepped in the usual manner. The time-out was completed. The target area was identified using fluoroscopy. A 12-in long, straight, sterile hemostat was used with fluoroscopic guidance to locate the targets for each level blocked. Once located, the skin was marked with an approved surgical skin marker. Once all sites were marked, the skin (epidermis, dermis, and hypodermis), as well as deeper tissues (fat, connective tissue and muscle) were infiltrated with a small amount of a short-acting local anesthetic, loaded on a 10cc syringe with a 25G, 1.5-in  Needle. An appropriate amount of time was allowed for local anesthetics to take effect before proceeding to the next step. Local Anesthetic: Lidocaine  2.0% The unused portion of the local  anesthetic was discarded in the proper designated containers. Technical description of process:  L2 Medial Branch Nerve Block (MBB): The target area for the L2 medial branch is at the junction of the postero-lateral aspect of the superior articular process and the superior, posterior, and medial edge of the transverse process of L3. Under fluoroscopic guidance, a Quincke needle was inserted until contact was made with os over the superior postero-lateral aspect of the pedicular shadow (target area). After negative aspiration for blood, 0.5 mL of the nerve block solution was injected without difficulty or complication. The needle was removed intact. L3 Medial Branch Nerve Block (MBB): The target area for the L3 medial branch is at the junction of the postero-lateral aspect of the superior articular process and the superior, posterior, and medial edge of the transverse process of L4. Under fluoroscopic guidance, a Quincke needle was inserted until contact was made with os over the superior postero-lateral aspect of the pedicular shadow (target area). After negative aspiration for blood, 0.5 mL of the nerve block solution was injected without difficulty or complication. The needle was removed intact. L4 Medial Branch Nerve Block (MBB): The target area for the L4 medial branch is at the junction of the postero-lateral aspect of the superior articular process and the superior, posterior, and medial edge of the transverse process of L5. Under fluoroscopic guidance, a Quincke needle was inserted until contact was made with os over the superior postero-lateral aspect of the pedicular shadow (target area). After negative aspiration for blood, 0.5 mL of the nerve block solution was injected without difficulty or complication. The needle was removed intact. L5 Medial Branch Nerve Block (MBB): The target area for the L5 medial branch is at the junction of the postero-lateral aspect of the superior articular process and the  superior, posterior, and medial edge of the sacral ala. Under fluoroscopic guidance, a Quincke needle was inserted until contact  was made with os over the superior postero-lateral aspect of the pedicular shadow (target area). After negative aspiration for blood, 0.5 mL of the nerve block solution was injected without difficulty or complication. The needle was removed intact. S1 Medial Branch Nerve Block (MBB): The target area for the S1 medial branch is at the posterior and inferior 6 o'clock position of the L5-S1 facet joint. Under fluoroscopic guidance, the Quincke needle inserted for the L5 MBB was redirected until contact was made with os over the inferior and postero aspect of the sacrum, at the 6 o' clock position under the L5-S1 facet joint (Target area). After negative aspiration for blood, 0.5 mL of the nerve block solution was injected without difficulty or complication. The needle was removed intact.  Once the entire procedure was completed, the treated area was cleaned, making sure to leave some of the prepping solution back to take advantage of its long term bactericidal properties.         Illustration of the posterior view of the lumbar spine and the posterior neural structures. Laminae of L2 through S1 are labeled. DPRL5, dorsal primary ramus of L5; DPRS1, dorsal primary ramus of S1; DPR3, dorsal primary ramus of L3; FJ, facet (zygapophyseal) joint L3-L4; I, inferior articular process of L4; LB1, lateral branch of dorsal primary ramus of L1; IAB, inferior articular branches from L3 medial branch (supplies L4-L5 facet joint); IBP, intermediate branch plexus; MB3, medial branch of dorsal primary ramus of L3; NR3, third lumbar nerve root; S, superior articular process of L5; SAB, superior articular branches from L4 (supplies L4-5 facet joint also); TP3, transverse process of L3.   Facet Joint Innervation (* possible contribution)  L1-2 T12, L1 (L2*)  Medial Branch  L2-3 L1, L2 (L3*)          "          "  L3-4 L2, L3 (L4*)         "          "  L4-5 L3, L4 (L5*)         "          "  L5-S1 L4, L5, S1          "          "    Vitals:   07/22/23 1036 07/22/23 1130 07/22/23 1138  BP: 111/76 136/88 (!) 146/94  Pulse: 82    Resp: 16 18 18   Temp: 97.6 F (36.4 C)    TempSrc: Temporal    SpO2: 97% 100% 100%  Weight: 185 lb (83.9 kg)    Height: 5\' 10"  (1.778 m)       End Time: 1137 hrs.  Imaging Guidance (Spinal):          Type of Imaging Technique: Fluoroscopy Guidance (Spinal) Indication(s): Fluoroscopy guidance for needle placement to enhance accuracy in procedures requiring precise needle localization for targeted delivery of medication in or near specific anatomical locations not easily accessible without such real-time imaging assistance. Exposure Time: Please see nurses notes. Contrast: None used. Fluoroscopic Guidance: I was personally present during the use of fluoroscopy. "Tunnel Vision Technique" used to obtain the best possible view of the target area. Parallax error corrected before commencing the procedure. "Direction-depth-direction" technique used to introduce the needle under continuous pulsed fluoroscopy. Once target was reached, antero-posterior, oblique, and lateral fluoroscopic projection used confirm needle placement in all planes. Images permanently stored in EMR. Interpretation: No contrast injected. I personally interpreted the  imaging intraoperatively. Adequate needle placement confirmed in multiple planes. Permanent images saved into the patient's record.  Post-operative Assessment:  Post-procedure Vital Signs:  Pulse/HCG Rate: 8293 Temp: 97.6 F (36.4 C) Resp: 18 BP: (!) 146/94 SpO2: 100 %  EBL: None  Complications: No immediate post-treatment complications observed by team, or reported by patient.  Note: The patient tolerated the entire procedure well. A repeat set of vitals were taken after the procedure and the patient was kept under  observation following institutional policy, for this type of procedure. Post-procedural neurological assessment was performed, showing return to baseline, prior to discharge. The patient was provided with post-procedure discharge instructions, including a section on how to identify potential problems. Should any problems arise concerning this procedure, the patient was given instructions to immediately contact us , at any time, without hesitation. In any case, we plan to contact the patient by telephone for a follow-up status report regarding this interventional procedure.  Comments:  No additional relevant information.  Plan of Care (POC)  Orders:  Orders Placed This Encounter  Procedures   LUMBAR FACET(MEDIAL BRANCH NERVE BLOCK) MBNB    Scheduling Instructions:     Procedure: Lumbar facet block (AKA.: Lumbosacral medial branch nerve block)     Side: Bilateral     Level: L3-4, L4-5, L5-S1, and TBD Facets (L2, L3, L4, L5, S1, and TBD Medial Branch Nerves)     Sedation: Patient's choice.     Timeframe: Today    Where will this procedure be performed?:   [facility]   DG [facility] C-ARM 1-60 MIN NO REPORT    Intraoperative interpretation by procedural physician at [facility].    Standing Status:   Standing    Number of Occurrences:   1    Reason for exam::   Assistance in needle guidance and placement for procedures requiring needle placement in or near specific anatomical locations not easily accessible without such assistance.   Informed Consent Details: Physician/Practitioner Attestation; Transcribe to consent form and obtain patient signature    Nursing Order: Transcribe to consent form and obtain patient signature. Note: Always confirm laterality of pain with Mr. Googe, before procedure.    Physician/Practitioner attestation of informed consent for procedure/surgical case:   I, the physician/practitioner, attest that I have discussed with the patient the benefits,  risks, side effects, alternatives, likelihood of achieving goals and potential problems during recovery for the procedure that I have provided informed consent.    Procedure:   Lumbar Facet Block  under fluoroscopic guidance    Physician/Practitioner performing the procedure:   Diannie Willner A. Barth Borne MD    Indication/Reason:   Low Back Pain, with our without leg pain, due to Facet Joint Arthralgia (Joint Pain) Spondylosis (Arthritis of the Spine), without myelopathy or radiculopathy (Nerve Damage).   Care order/instruction: Please confirm that the patient has stopped the Plavix  (Clopidogrel ) x 7-10 days prior to procedure or surgery.    Please confirm that the patient has stopped the Plavix  (Clopidogrel ) x 7-10 days prior to procedure or surgery.    Standing Status:   Standing    Number of Occurrences:   1   Provide equipment / supplies at bedside    Procedure tray: "Block Tray" (Disposable  single use) Skin infiltration needle: Regular 1.5-in, 25-G, (x1) Block Needle type: Spinal Amount/quantity: 4 Size: Regular (3.5-inch) Gauge: 22G    Standing Status:   Standing    Number of Occurrences:   1    Specify:   Block  Tray   Bleeding precautions    Standing Status:   Standing    Number of Occurrences:   1   Chronic Opioid Analgesic:  No chronic opioid analgesics therapy prescribed by our practice. None. MME/day: 0 mg/day   Medications ordered for procedure: Meds ordered this encounter  Medications   lidocaine  (XYLOCAINE ) 2 % (with pres) injection 400 mg   pentafluoroprop-tetrafluoroeth (GEBAUERS) aerosol   DISCONTD: midazolam  (VERSED ) 5 MG/5ML injection 0.5-2 mg    Make sure Flumazenil is available in the pyxis when using this medication. If oversedation occurs, administer 0.2 mg IV over 15 sec. If after 45 sec no response, administer 0.2 mg again over 1 min; may repeat at 1 min intervals; not to exceed 4 doses (1 mg)   DISCONTD: fentaNYL  (SUBLIMAZE ) injection 25-50 mcg    Make sure  Narcan is available in the pyxis when using this medication. In the event of respiratory depression (RR< 8/min): Titrate NARCAN (naloxone) in increments of 0.1 to 0.2 mg IV at 2-3 minute intervals, until desired degree of reversal.   ropivacaine  (PF) 2 mg/mL (0.2%) (NAROPIN ) injection 18 mL   triamcinolone  acetonide (KENALOG -40) injection 80 mg   Medications administered: We administered lidocaine , pentafluoroprop-tetrafluoroeth, ropivacaine  (PF) 2 mg/mL (0.2%), and triamcinolone  acetonide.  See the medical record for exact dosing, route, and time of administration.  Follow-up plan:   Return in about 2 weeks (around 08/05/2023) for (Face2F), (PPE).       Interventional Therapies  Risk Factors  Considerations  Medical Comorbidities:  Plavix  Anticoagulation: (Stop: 7-10 days  Restart: 2 hours)  CAD  Hx. NSTEMI  S/P CABG x5  Stage 3b CKD  T2NIDDM     Planned  Pending:   Diagnostic bilateral lumbar facet MBB #1    Under consideration:   Diagnostic bilateral lumbar facet MBB #1  Possible Racz procedure #2    Completed:   Therapeutic left Racz procedure there #1 (06/12/2023) (100/100/80/80) Diagnostic/therapeutic left caudal ESI + diagnostic epidurogram x1 (05/08/2023) (100/100/50/50)   Therapeutic  Palliative (PRN) options:   None established   Completed by other providers:   Surgery: Right L2-3 lateral transpsoas interbody fusion w/ lateral plating and L2-3 decompression (07/30/2016) by Noland Battles, MD (Neurosurgery)  Surgery: Left L3-4 and L4-5 anterolateral fusion with interbody spacer and right L3-4 and L4-5 pedicle screws (03/13/2010) by Tivis Forster, MD (Neurosurgery)  [facility] Procedures Date Name LateralityProvider Name and Address 12/03/2022 G-RDR SNRB                                             Adelaide Adjutant, MD 01/17/2022 G-RDR SNRB 2 Level                                          Adelaide Adjutant, MD 07/03/2021 G-RDR SNRB 2 Level                                Jaquelyn Merino 05/01/2021 G-JCB Trigger Point                                 Orvan Blanch, MD 12/04/2020 G-RDR SNRB 2 Level  Adelaide Adjutant, MD 07/18/2020 G-RDR SNRB                                              Adelaide Adjutant, MD 01/17/2020 Zena High Lumbar TFESIK U1 Level  Fletcher Humble, MD 09/25/2018 Lumbar Laminectomy    09/20/2018 LAMINOTOMY (HEMILAMINECTOMY), DECOMPRESSION OF NERVE ROOTS, PARTIAL FACECTOTOMY, FORAMINOTOMY AND/OR DISC REMOVAL, LUMBAR (SURG) August Blitz 07/30/2016 Lumbar Laminectomy   April Webb 03/07/2011 Lumbar Laminectomy   April Webb 03/06/2001 Lumbar Laminectomy       Recent Visits Date Type Provider Dept  07/09/23 Office Visit Renaldo Caroli, MD [facility]  06/12/23 Procedure visit Renaldo Caroli, MD [facility]  05/27/23 Office Visit Renaldo Caroli, MD [facility]  05/08/23 Procedure visit Renaldo Caroli, MD [facility]  Showing recent visits within past 90 days and meeting all other requirements Today's Visits Date Type Provider Dept  07/22/23 Procedure visit Renaldo Caroli, MD [facility]  Showing today's visits and meeting all other requirements Future Appointments Date Type Provider Dept  08/12/23 Appointment Renaldo Caroli, MD [facility]  Showing future appointments within next 90 days and meeting all other requirements  Disposition: Discharge home  Discharge (Date  Time): 07/22/2023; 1200 hrs.   Primary Care Physician: Lamon Pillow, MD Location: [facility] Note by: Candi Chafe, MD (TTS technology used. I apologize for any typographical errors that were not detected and corrected.) Date: 07/22/2023; Time: 12:10 PM  Disclaimer:  Medicine is not an Visual merchandiser. The only guarantee in medicine is that nothing is guaranteed. It is important to note that the decision to proceed with this intervention  was based on the information collected from the patient. The Data and conclusions were drawn from the patient's questionnaire, the interview, and the physical examination. Because the information was provided in large part by the patient, it cannot be guaranteed that it has not been purposely or unconsciously manipulated. Every effort has been made to obtain as much relevant data as possible for this evaluation. It is important to note that the conclusions that lead to this procedure are derived in large part from the available data. Always take into account that the treatment will also be dependent on availability of resources and existing treatment guidelines, considered by other Pain Management Practitioners as being common knowledge and practice, at the time of the intervention. For Medico-Legal purposes, it is also important to point out that variation in procedural techniques and pharmacological choices are the acceptable norm. The indications, contraindications, technique, and results of the above procedure should only be interpreted and judged by a Board-Certified Interventional Pain Specialist with extensive familiarity and expertise in the same exact procedure and technique.

## 2023-07-22 NOTE — Patient Instructions (Signed)

## 2023-07-23 ENCOUNTER — Telehealth: Payer: Self-pay

## 2023-07-23 NOTE — Telephone Encounter (Signed)
Post procedure follow up.  Patient states he is doing well 

## 2023-08-01 ENCOUNTER — Telehealth: Payer: Self-pay | Admitting: Family Medicine

## 2023-08-01 NOTE — Telephone Encounter (Signed)
 Left message that provider is not available now on 6/25 due to vacation so appt has been cancelled and needs to be rescheduled for another day and time. Ok to schedule if patient calls back

## 2023-08-11 NOTE — Progress Notes (Signed)
 PROVIDER NOTE: Interpretation of information contained herein should be left to medically-trained personnel. Specific patient instructions are provided elsewhere under Patient Instructions section of medical record. This document was created in part using AI and STT-dictation technology, any transcriptional errors that may result from this process are unintentional.  Patient: Rodney Hall.  Service: E/M   PCP: Gasper Nancyann BRAVO, MD  DOB: 02-Jul-1945  DOS: 08/12/2023  Provider: Eric DELENA Como, MD  MRN: [id number]  Delivery: Face-to-face  Specialty: Interventional Pain Management  Type: Established Patient  Setting: Ambulatory outpatient facility  Specialty designation: 09  Referring Prov.: Gasper Nancyann BRAVO, MD  Location: Outpatient office facility       History of present illness (HPI) Rodney Hall., a 78 y.o. year old male, is here today because of his Chronic bilateral low back pain without sciatica [M54.50, G89.29]. Mr. Pember primary complain today is Back Pain (lower)  Pertinent problems: Mr. Dupee has Impingement syndrome of shoulder region; History of spinal fusion; Lumbar post-laminectomy syndrome; Lumbosacral spondylosis with radiculopathy; Abnormal CT scan, lumbar spine (06/17/2016); Failed back surgical syndrome; Chronic low back pain (1ry area of Pain) (Bilateral) w/o sciatica; Chronic lower extremity pain (Left); Epidural fibrosis; Lumbosacral radiculopathy at S1 (Left); Lumbosacral radiculopathy at L5 (Left); Lumbar facet hypertrophy (Multilevel) (Bilateral); Abnormal MRI, lumbar spine (02/04/2023) [facility]); Lumbar facet joint pain; Spondylosis without myelopathy or radiculopathy, lumbosacral region; Lumbar facet joint syndrome; Low back pain of over 3 months duration; Mechanical low back pain; Multifactorial low back pain; Intractable low back pain; and Discogenic low back pain on their pertinent problem list.  Pain Assessment: Severity of  Chronic pain is reported as a 2 /10. Location: Back Lower/denies. Onset: More than a month ago. Quality: Aching. Timing: Constant. Modifying factor(s): sitting down. Vitals:  height is 5' 10 (1.778 m) and weight is 185 lb (83.9 kg). His temperature is 98.2 F (36.8 C). His blood pressure is 111/69 and his pulse is 90. His oxygen saturation is 100%.  BMI: Estimated body mass index is 26.54 kg/m as calculated from the following:   Height as of this encounter: 5' 10 (1.778 m).   Weight as of this encounter: 185 lb (83.9 kg).  Last encounter: 07/09/2023. Last procedure: 07/22/2023.  Reason for encounter: post-procedure evaluation and assessment.   Discussed the use of AI scribe software for clinical note transcription with the patient, who gave verbal consent to proceed.  History of Present Illness   Rodney Hall. is a 78 year old male who presents for follow-up after a diagnostic bilateral lumbar facet medial branch block.  He experienced complete relief of his back pain during the local anesthetic phase of the procedure, followed by a sustained 50% improvement in symptoms. The day of the procedure, he was pain-free for the rest of the day. The numbing effect wore off by the next morning, but the pain was much improved compared to before the procedure.  He describes increased functionality, being able to perform activities such as grocery shopping without significant pain, which marks a notable improvement from his previous condition where standing for five minutes would cause pain. He no longer experiences severe pain after activities like leaving a cruise line and getting to his truck.  No leg pain is reported, and the discomfort he experiences post-procedure is his usual back pain rather than pain from the needle sticks. He has significant back issues, including hardware such as rods, straps, and screws in his back.  Post-Procedure Evaluation   Type: Lumbar Facet, Medial Branch  Block(s) (w/ fluoroscopic mapping) #1  Laterality: Bilateral  Level: L2, L3, L4, L5, and S1 Medial Branch Level(s). Injecting these levels blocks the L3-4, L4-5, and L5-S1 lumbar facet joints. Imaging: Fluoroscopic guidance Spinal ([id number]) Anesthesia: Local anesthesia (1-2% Lidocaine ) Anxiolysis: None                 Sedation: No Sedation                       DOS: 07/22/2023 Performed by: Eric DELENA Como, MD  Primary Purpose: Diagnostic/Therapeutic Indications: Low back pain severe enough to impact quality of life or function. 1. 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 Lumbar facet joint pain   3. Lumbar facet hypertrophy (Multilevel) (Bilateral)   4. Lumbar facet joint syndrome   5. Lumbar post-laminectomy syndrome   6. Mechanical low back pain   7. Multifactorial low back pain   8. Spondylosis without myelopathy or radiculopathy, lumbosacral region   9. Low back pain of over 3 months duration   10. Intractable low back pain   11. History of spinal fusion   12. Chronic anticoagulation (Plavix )    NAS-11 Pain score:   Pre-procedure: 1 /10   Post-procedure: 1 /10     Effectiveness:  Initial hour after procedure: 100 %. Subsequent 4-6 hours post-procedure: 100 %. Analgesia past initial 6 hours: 50 %. Ongoing improvement:  Analgesic: The patient indicates having attained 100% relief of the pain for the duration of local anesthetic followed by a decrease to an ongoing 50% improvement. Function: Mr. Leja reports improvement in function ROM: Mr. Portal reports improvement in ROM   Pharmacotherapy Assessment   Analgesic: No chronic opioid analgesics therapy prescribed by our practice. None. MME/day: 0 mg/day   Monitoring: [place] PMP: [name]       Pharmacotherapy: No side-effects or adverse reactions reported. Compliance: No problems identified. Effectiveness: Clinically acceptable.  Margrette Nathanel PARAS, RN   08/12/2023  1:35 PM  Sign when Signing Visit Safety precautions to be maintained throughout the outpatient stay will include: orient to surroundings, keep bed in low position, maintain call bell within reach at all times, provide assistance with transfer out of bed and ambulation.     UDS:  No results found for: SUMMARY  No results found for: CBDTHCR No results found for: D8THCCBX No results found for: D9THCCBX  ROS  Constitutional: Denies any fever or chills Gastrointestinal: No reported hemesis, hematochezia, vomiting, or acute GI distress Musculoskeletal: Denies any acute onset joint swelling, redness, loss of ROM, or weakness Neurological: No reported episodes of acute onset apraxia, aphasia, dysarthria, agnosia, amnesia, paralysis, loss of coordination, or loss of consciousness  Medication Review  acetaminophen , aspirin  EC, atorvastatin , busPIRone , clopidogrel , imipramine , omeprazole , oxyCODONE -acetaminophen , and tamsulosin   History Review  Allergy: Mr. Buday is allergic to pravastatin  and amoxicillin. Drug: Mr. Winchell  reports no history of drug use. Alcohol:  reports no history of alcohol use. Tobacco:  reports that he has never smoked. He has never been exposed to tobacco smoke. He has never used smokeless tobacco. Social: Mr. Shad  reports that he has never smoked. He has never been exposed to tobacco smoke. He has never used smokeless tobacco. He reports that he does not drink alcohol and does not use drugs. 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 Surgical: Mr. Toral  has a past surgical history that includes Back surgery (2012); Neck surgery (2000); Cataract extraction, bilateral; Tonsillectomy and adenoidectomy; Anterior lat lumbar fusion (Right, 07/30/2016); EUS (N/A, 07/10/2017); Tennis elbow release/nirschel procedure; Eye surgery; Cholecystectomy (N/A, 07/25/2017); Colonoscopy with propofol  (N/A, 12/12/2017); Hemi-microdiscectomy lumbar laminectomy level 1 (09/2018); LEFT HEART CATH AND CORONARY ANGIOGRAPHY (N/A, 09/04/2022); Coronary artery bypass graft (N/A, 09/07/2022); TEE without cardioversion (N/A, 09/07/2022); LEFT HEART CATH AND CORONARY ANGIOGRAPHY (N/A, 09/06/2022); IABP Insertion (N/A, 09/06/2022); Colonoscopy with propofol  (N/A, 06/27/2023); and Polypectomy (06/27/2023). Family: family history includes Cancer in his mother; Diabetes in his brother and father; Heart disease in his sister; Rheumatic fever in his sister.  Laboratory Chemistry Profile   Renal Lab Results  Component Value Date   BUN 20 05/29/2023   CREATININE 1.82 (H) 05/29/2023   BCR 9 (L) 04/01/2023   GFRAA 58 (L) 01/13/2019   GFRNONAA 38 (L) 05/29/2023    Hepatic Lab Results  Component Value Date   AST 19 05/29/2023   ALT 14 05/29/2023   ALBUMIN  4.4 05/29/2023   ALKPHOS 61 05/29/2023   HCVAB <0.1 06/02/2017   LIPASE 39 05/29/2023    Electrolytes Lab Results  Component Value Date   NA 135 05/29/2023   K 4.7 05/29/2023   CL 102 05/29/2023   CALCIUM  9.2 05/29/2023   MG 2.3 09/08/2022   PHOS 3.5 02/22/2022    Bone No results found for: VD25OH, VD125OH2TOT, CI6874NY7, CI7874NY7, 25OHVITD1, 25OHVITD2, 25OHVITD3, TESTOFREE, TESTOSTERONE  Inflammation (CRP: Acute Phase) (ESR: Chronic Phase) No results found for: CRP, ESRSEDRATE, LATICACIDVEN       Note: Above Lab results reviewed.  Recent Imaging Review  DG PAIN CLINIC C-ARM 1-60 MIN NO REPORT Fluoro was used, but no Radiologist interpretation will be  provided.  Please refer to NOTES tab for provider progress note. Note: Reviewed         Physical Exam  General appearance: Well nourished, well developed, and well hydrated. In no apparent acute distress Mental status: Alert, oriented x 3 (person, place, & time)       Respiratory: No evidence of acute respiratory distress Eyes: PERLA Vitals: BP 111/69   Pulse 90   Temp 98.2 F (36.8 C)   Ht 5' 10 (1.778 m)   Wt 185 lb (83.9 kg)   SpO2 100%   BMI 26.54 kg/m  BMI: Estimated body mass index is 26.54 kg/m as calculated from the following:   Height as of this encounter: 5' 10 (1.778 m).   Weight as of this encounter: 185 lb (83.9 kg). Ideal: Ideal body weight: 73 kg (160 lb 15 oz) Adjusted ideal body weight: 77.4 kg (170 lb 9 oz)  Assessment   Diagnosis Status  1. 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 Lumbar facet joint pain   3. Lumbar facet joint syndrome   4. Multifactorial low back pain   5. Postop check   6. Chronic anticoagulation (Plavix )    Improving Improved Resolved   Updated Problems: No problems updated.  Plan of Care  Problem-specific:  Assessment and Plan    Chronic back pain   Chronic back pain has significantly improved following a bilateral lumbar facet medial branch block. He experienced 100% relief during  the local anesthetic phase and ongoing 50% improvement thereafter. Pain likely results from inflammation and mechanical compression. The initial procedure confirmed the correct anatomical location of pain. Partial relief from the steroid component indicates inflammation, while persistent pain suggests mechanical compression. Extensive spinal hardware may affect radiofrequency ablation success due to potential scar tissue formation. If the second procedure provides similar relief, it suggests steroids addressed inflammation, but mechanical issues persist. If the second procedure results in 100% relief, it indicates the  initial dose was insufficient for the inflammation present. Repeat the bilateral lumbar facet medial branch block to confirm the pain source and assess response. If similar relief occurs, consider radiofrequency ablation for long-term pain management. If radiofrequency ablation is ineffective due to hardware, consider a temporary peripheral nerve stimulator. Evaluate the TENS unit and adjust settings if necessary. Encourage use of larger pads for better efficacy.       Mr. Davie Claud. has a current medication list which includes the following long-term medication(s): atorvastatin  and imipramine .  Pharmacotherapy (Medications Ordered): No orders of the defined types were placed in this encounter.  Orders:  Orders Placed This Encounter  Procedures   LUMBAR FACET(MEDIAL BRANCH NERVE BLOCK) MBNB    Diagnosis: Lumbar Facet Syndrome (M47.816); Lumbosacral Facet Syndrome (M47.817); Lumbar Facet Joint Pain (M54.59) Medical Necessity Statement: 1.Severe chronic axial low back pain causing functional impairment documented by ongoing pain scale assessments. 2.Pain present for longer than 3 months (Chronic) documented to have failed noninvasive conservative therapies. 3.Absence of untreated radiculopathy. 4.There is no radiological evidence of untreated fractures, tumor, infection, or deformity.  Physical Examination Findings: Positive Kemp Maneuver: (Y)  Positive Lumbar Hyperextension-Rotation provocative test: (Y)    Standing Status:   Future    Expiration Date:   11/12/2023    Scheduling Instructions:     Procedure: Lumbar facet Block     Type: Medial Branch Block     Side: Bilateral     Purpose: Diagnostic/Therapeutic     Level(s): L3-4, L4-5, and L5-S1 Facets (L2, L3, L4, L5, and S1 Medial Branch)     Sedation: With Sedation.     Timeframe: see Blood-thinner protocol.    Where will this procedure be performed?:   [facility]   Nursing Instructions:    Please complete this  patient's postprocedure evaluation.    Scheduling Instructions:     Please complete this patient's postprocedure evaluation.   Blood Thinner Instructions to Nursing    Always make sure patient has clearance from prescribing physician to stop blood thinners for interventional therapies. If the patient requires a Lovenox -bridge therapy, make sure arrangements are made to institute it with the assistance of the PCP.    Scheduling Instructions:     Have Mr. Dabbs stop the Plavix  (Clopidogrel ) x 7-10 days prior to procedure or surgery.     Interventional Therapies  Risk Factors  Considerations  Medical Comorbidities:  Plavix  Anticoagulation: (Stop: 7-10 days  Restart: 2 hours)  CAD  Hx. NSTEMI  S/P CABG x5  Stage 3b CKD  T2NIDDM     Planned  Pending:   Diagnostic bilateral lumbar facet MBB #1    Under consideration:   Diagnostic bilateral lumbar facet MBB #1  Possible Racz procedure #2    Completed:   Therapeutic left Racz procedure there #1 (06/12/2023) (100/100/80/80) Diagnostic/therapeutic left caudal ESI + diagnostic epidurogram x1 (05/08/2023) (100/100/50/50)   Therapeutic  Palliative (PRN) options:   None established   Completed by other providers:   Surgery: Right  L2-3 lateral transpsoas interbody fusion w/ lateral plating and L2-3 decompression (07/30/2016) by Morene DOROTHA Stammer, MD (Neurosurgery)  Surgery: Left L3-4 and L4-5 anterolateral fusion with interbody spacer and right L3-4 and L4-5 pedicle screws (03/13/2010) by Darina Boehringer, MD (Neurosurgery)  [facility] Procedures Date Name LateralityProvider Name and Address 12/03/2022 G-RDR SNRB                                             Charlie Dolores, MD 01/17/2022 G-RDR SNRB 2 Level                                          Charlie Dolores, MD 07/03/2021 G-RDR SNRB 2 Level                               Manuelita Shoulder 05/01/2021 G-JCB Trigger Point                                 Reyes Billing, MD 12/04/2020 G-RDR SNRB 2  Level                      Charlie Dolores, MD 07/18/2020 G-RDR SNRB                                              Charlie Dolores, MD 01/17/2020 [email or web address] Lumbar TFESIK U1 Level  Charleen Martins, MD 09/25/2018 Lumbar Laminectomy    09/20/2018 LAMINOTOMY (HEMILAMINECTOMY), DECOMPRESSION OF NERVE ROOTS, PARTIAL FACECTOTOMY, FORAMINOTOMY AND/OR DISC REMOVAL, LUMBAR (SURG) Shanda Lenis 07/30/2016 Lumbar Laminectomy   April Webb 03/07/2011 Lumbar Laminectomy   April Webb 03/06/2001 Lumbar Laminectomy      Return for Clinic (NS): (B) L-FCT Blk #2, (Blood Thinner Protocol).    Recent Visits Date Type Provider Dept  07/22/23 Procedure visit Tanya Glisson, MD [facility]  07/09/23 Office Visit Tanya Glisson, MD [facility]  06/12/23 Procedure visit Tanya Glisson, MD [facility]  05/27/23 Office Visit Tanya Glisson, MD [facility]  Showing recent visits within past 90 days and meeting all other requirements Today's Visits Date Type Provider Dept  08/12/23 Office Visit Tanya Glisson, MD [facility]  Showing today's visits and meeting all other requirements Future Appointments Date Type Provider Dept  08/28/23 Appointment Tanya Glisson, MD [facility]  Showing future appointments within next 90 days and meeting all other requirements  I discussed the assessment and treatment plan with the patient. The patient was provided an opportunity to ask questions and all were answered. The patient agreed with the plan and demonstrated an understanding of the instructions.  Patient advised to call back or seek an in-person evaluation if the symptoms or condition worsens.  Duration of encounter: 35 minutes.  Total time on encounter, as per AMA guidelines included both the face-to-face and non-face-to-face time personally spent by the physician and/or other qualified health care professional(s) on the day of the  encounter (includes time in activities that require the physician or other qualified health care professional and does not include  time in activities normally performed by clinical staff). Physician's time may include the following activities when performed: Preparing to see the patient (e.g., pre-charting review of records, searching for previously ordered imaging, lab work, and nerve conduction tests) Review of prior analgesic pharmacotherapies. Reviewing PMP Interpreting ordered tests (e.g., lab work, imaging, nerve conduction tests) Performing post-procedure evaluations, including interpretation of diagnostic procedures Obtaining and/or reviewing separately obtained history Performing a medically appropriate examination and/or evaluation Counseling and educating the patient/family/caregiver Ordering medications, tests, or procedures Referring and communicating with other health care professionals (when not separately reported) Documenting clinical information in the electronic or other health record Independently interpreting results (not separately reported) and communicating results to the patient/ family/caregiver Care coordination (not separately reported)  Note by: Eric DELENA Como, MD (TTS and AI technology used. I apologize for any typographical errors that were not detected and corrected.) Date: 08/12/2023; Time: 4:43 PM

## 2023-08-12 ENCOUNTER — Ambulatory Visit: Attending: Pain Medicine | Admitting: Pain Medicine

## 2023-08-12 ENCOUNTER — Encounter: Payer: Self-pay | Admitting: Pain Medicine

## 2023-08-12 VITALS — BP 111/69 | HR 90 | Temp 98.2°F | Ht 70.0 in | Wt 185.0 lb

## 2023-08-12 DIAGNOSIS — G8929 Other chronic pain: Secondary | ICD-10-CM | POA: Diagnosis present

## 2023-08-12 DIAGNOSIS — Z09 Encounter for follow-up examination after completed treatment for conditions other than malignant neoplasm: Secondary | ICD-10-CM | POA: Diagnosis present

## 2023-08-12 DIAGNOSIS — M545 Low back pain, unspecified: Secondary | ICD-10-CM | POA: Diagnosis present

## 2023-08-12 DIAGNOSIS — M47816 Spondylosis without myelopathy or radiculopathy, lumbar region: Secondary | ICD-10-CM | POA: Diagnosis present

## 2023-08-12 DIAGNOSIS — M5459 Other low back pain: Secondary | ICD-10-CM | POA: Diagnosis present

## 2023-08-12 DIAGNOSIS — Z7901 Long term (current) use of anticoagulants: Secondary | ICD-10-CM | POA: Insufficient documentation

## 2023-08-12 NOTE — Progress Notes (Signed)
 Safety precautions to be maintained throughout the outpatient stay will include: orient to surroundings, keep bed in low position, maintain call bell within reach at all times, provide assistance with transfer out of bed and ambulation.

## 2023-08-12 NOTE — Patient Instructions (Signed)
 ______________________________________________________________________    Procedure instructions  Stop blood-thinners  Do not eat or drink fluids (other than water ) for 6 hours before your procedure  No water  for 2 hours before your procedure  Take your blood pressure medicine with a sip of water   Arrive 30 minutes before your appointment  If sedation is planned, bring suitable driver. Teddie Favre, [place], & public transportation are NOT APPROVED)  Carefully read the "Preparing for your procedure" detailed instructions  If you have questions call us  at [phone]  Procedure appointments are for procedures only.   NO medication refills or new problem evaluations will be done on procedure days.   Only the scheduled, pre-approved procedure and side will be done.   ______________________________________________________________________      ______________________________________________________________________    Preparing for your procedure  Appointments: If you think you may not be able to keep your appointment, call 24-48 hours in advance to cancel. We need time to make it available to others.  Procedure visits are for procedures only. During your procedure appointment there will be: NO Prescription Refills*. NO medication changes or discussions*. NO discussion of disability issues*. NO unrelated pain problem evaluations*. NO evaluations to order other pain procedures*. *These will be addressed at a separate and distinct evaluation encounter on the provider's evaluation schedule and not during procedure days.  Instructions: Food intake: Avoid eating anything solid for at least 8 hours prior to your procedure. Clear liquid intake: You may take clear liquids such as water  up to 2 hours prior to your procedure. (No carbonated drinks. No soda.) Transportation: Unless otherwise stated by your physician, bring a driver. (Driver cannot be a Market researcher, Pharmacist, community, or any other form of public  transportation.) Morning Medicines: Except for blood thinners, take all of your other morning medications with a sip of water . Make sure to take your heart and blood pressure medicines. If your blood pressure's lower number is above 100, the case will be rescheduled. Blood thinners: Make sure to stop your blood thinners as instructed.  If you take a blood thinner, but were not instructed to stop it, call our office [phone] and ask to talk to a nurse. Not stopping a blood thinner prior to certain procedures could lead to serious complications. Diabetics on insulin : Notify the staff so that you can be scheduled 1st case in the morning. If your diabetes requires high dose insulin , take only  of your normal insulin  dose the morning of the procedure and notify the staff that you have done so. Preventing infections: Shower with an antibacterial soap the morning of your procedure.  Build-up your immune system: Take 1000 mg of Vitamin C with every meal (3 times a day) the day prior to your procedure. Antibiotics: Inform the nursing staff if you are taking any antibiotics or if you have any conditions that may require antibiotics prior to procedures. (Example: recent joint implants)   Pregnancy: If you are pregnant make sure to notify the nursing staff. Not doing so may result in injury to the fetus, including death.  Sickness: If you have a cold, fever, or any active infections, call and cancel or reschedule your procedure. Receiving steroids while having an infection may result in complications. Arrival: You must be in the facility at least 30 minutes prior to your scheduled procedure. Tardiness: Your scheduled time is also the cutoff time. If you do not arrive at least 15 minutes prior to your procedure, you will be rescheduled.  Children: Do not bring any children  with you. Make arrangements to keep them home. Dress appropriately: There is always a possibility that your clothing may get soiled. Avoid  long dresses. Valuables: Do not bring any jewelry or valuables.  Reasons to call and reschedule or cancel your procedure: (Following these recommendations will minimize the risk of a serious complication.) Surgeries: Avoid having procedures within 2 weeks of any surgery. (Avoid for 2 weeks before or after any surgery). Flu Shots: Avoid having procedures within 2 weeks of a flu shots or . (Avoid for 2 weeks before or after immunizations). Barium: Avoid having a procedure within 7-10 days after having had a radiological study involving the use of radiological contrast. (Myelograms, Barium swallow or enema study). Heart attacks: Avoid any elective procedures or surgeries for the initial 6 months after a "Myocardial Infarction" (Heart Attack). Blood thinners: It is imperative that you stop these medications before procedures. Let us  know if you if you take any blood thinner.  Infection: Avoid procedures during or within two weeks of an infection (including chest colds or gastrointestinal problems). Symptoms associated with infections include: Localized redness, fever, chills, night sweats or profuse sweating, burning sensation when voiding, cough, congestion, stuffiness, runny nose, sore throat, diarrhea, nausea, vomiting, cold or Flu symptoms, recent or current infections. It is specially important if the infection is over the area that we intend to treat. Heart and lung problems: Symptoms that may suggest an active cardiopulmonary problem include: cough, chest pain, breathing difficulties or shortness of breath, dizziness, ankle swelling, uncontrolled high or unusually low blood pressure, and/or palpitations. If you are experiencing any of these symptoms, cancel your procedure and contact your primary care physician for an evaluation.  Remember:  Regular Business hours are:  Monday to Thursday 8:00 AM to 4:00 PM  Provider's Schedule: Renaldo Caroli, MD:  Procedure days: Tuesday and Thursday 7:30  AM to 4:00 PM  Cephus Collin, MD:  Procedure days: Monday and Wednesday 7:30 AM to 4:00 PM Last  Updated: 02/11/2023 ______________________________________________________________________      ______________________________________________________________________    General Risks and Possible Complications  Patient Responsibilities: It is important that you read this as it is part of your informed consent. It is our duty to inform you of the risks and possible complications associated with treatments offered to you. It is your responsibility as a patient to read this and to ask questions about anything that is not clear or that you believe was not covered in this document.  Patient's Rights: You have the right to refuse treatment. You also have the right to change your mind, even after initially having agreed to have the treatment done. However, under this last option, if you wait until the last second to change your mind, you may be charged for the materials used up to that point.  Introduction: Medicine is not an Visual merchandiser. Everything in Medicine, including the lack of treatment(s), carries the potential for danger, harm, or loss (which is by definition: Risk). In Medicine, a complication is a secondary problem, condition, or disease that can aggravate an already existing one. All treatments carry the risk of possible complications. The fact that a side effects or complications occurs, does not imply that the treatment was conducted incorrectly. It must be clearly understood that these can happen even when everything is done following the highest safety standards.  No treatment: You can choose not to proceed with the proposed treatment alternative. The "PRO(s)" would include: avoiding the risk of complications associated with the therapy. The "CON(s)"  would include: not getting any of the treatment benefits. These benefits fall under one of three categories: diagnostic; therapeutic; and/or  palliative. Diagnostic benefits include: getting information which can ultimately lead to improvement of the disease or symptom(s). Therapeutic benefits are those associated with the successful treatment of the disease. Finally, palliative benefits are those related to the decrease of the primary symptoms, without necessarily curing the condition (example: decreasing the pain from a flare-up of a chronic condition, such as incurable terminal cancer).  General Risks and Complications: These are associated to most interventional treatments. They can occur alone, or in combination. They fall under one of the following six (6) categories: no benefit or worsening of symptoms; bleeding; infection; nerve damage; allergic reactions; and/or death. No benefits or worsening of symptoms: In Medicine there are no guarantees, only probabilities. No healthcare provider can ever guarantee that a medical treatment will work, they can only state the probability that it may. Furthermore, there is always the possibility that the condition may worsen, either directly, or indirectly, as a consequence of the treatment. Bleeding: This is more common if the patient is taking a blood thinner, either prescription or over the counter (example: Goody Powders, Fish oil, Aspirin , Garlic, etc.), or if suffering a condition associated with impaired coagulation (example: Hemophilia, cirrhosis of the liver, low platelet counts, etc.). However, even if you do not have one on these, it can still happen. If you have any of these conditions, or take one of these drugs, make sure to notify your treating physician. Infection: This is more common in patients with a compromised immune system, either due to disease (example: diabetes, cancer, human immunodeficiency virus [HIV], etc.), or due to medications or treatments (example: therapies used to treat cancer and rheumatological diseases). However, even if you do not have one on these, it can still  happen. If you have any of these conditions, or take one of these drugs, make sure to notify your treating physician. Nerve Damage: This is more common when the treatment is an invasive one, but it can also happen with the use of medications, such as those used in the treatment of cancer. The damage can occur to small secondary nerves, or to large primary ones, such as those in the spinal cord and brain. This damage may be temporary or permanent and it may lead to impairments that can range from temporary numbness to permanent paralysis and/or brain death. Allergic Reactions: Any time a substance or material comes in contact with our body, there is the possibility of an allergic reaction. These can range from a mild skin rash (contact dermatitis) to a severe systemic reaction (anaphylactic reaction), which can result in death. Death: In general, any medical intervention can result in death, most of the time due to an unforeseen complication. ______________________________________________________________________      ______________________________________________________________________    Blood Thinners  IMPORTANT NOTICE:  If you take any of these, make sure to notify the nursing staff.  Failure to do so may result in serious injury.  Recommended time intervals to stop and restart blood-thinners, before & after invasive procedures  Generic Name Brand Name Pre-procedure: Stop medication for this amount of time before your procedure: Post-procedure: Wait this amount of time after the procedure before restarting your medication:  Abciximab Reopro 15 days 2 hrs  Alteplase Activase 10 days 10 days  Anagrelide Agrylin    Apixaban Eliquis 3 days 6 hrs  Cilostazol Pletal 3 days 5 hrs  Clopidogrel  Plavix  7-10 days 2  hrs  Dabigatran Pradaxa 5 days 6 hrs  Dalteparin Fragmin 24 hours 4 hrs  Dipyridamole Aggrenox 11days 2 hrs  Edoxaban Lixiana; Savaysa 3 days 2 hrs  Enoxaparin   Lovenox  24 hours 4 hrs   Eptifibatide Integrillin 8 hours 2 hrs  Fondaparinux  Arixtra 72 hours 12 hrs  Hydroxychloroquine Plaquenil 11 days   Prasugrel Effient 7-10 days 6 hrs  Reteplase Retavase 10 days 10 days  Rivaroxaban Xarelto 3 days 6 hrs  Ticagrelor Brilinta 5-7 days 6 hrs  Ticlopidine Ticlid 10-14 days 2 hrs  Tinzaparin Innohep 24 hours 4 hrs  Tirofiban Aggrastat 8 hours 2 hrs  Warfarin Coumadin 5 days 2 hrs   Other medications with blood-thinning effects  NOTE: Consider stopping these if you have prolonged bleeding despite not taking any of the above blood thinners. Otherwise ask your provider and this will be decided on a case-by-case basis.  Product indications Generic (Brand) names Note  Cholesterol Lipitor Stop 4 days before procedure  Blood thinner (injectable) Heparin  (LMW or LMWH Heparin ) Stop 24 hours before procedure  Cancer Ibrutinib (Imbruvica) Stop 7 days before procedure  Malaria/Rheumatoid Hydroxychloroquine (Plaquenil) Stop 11 days before procedure  Thrombolytics  10 days before or after procedures   Over-the-counter (OTC) Products with blood-thinning effects  NOTE: Consider stopping these if you have prolonged bleeding despite not taking any of the above blood thinners. Otherwise ask your provider and this will be decided on a case-by-case basis.  Product Common names Stop Time  Aspirin  > 325 mg Goody Powders, Excedrin, etc. 11 days  Aspirin  <= 81 mg  7 days  Fish oil  4 days  Garlic supplements  7 days  Ginkgo biloba  36 hours  Ginseng  24 hours  NSAIDs Ibuprofen, Naprosyn, etc. 3 days  Vitamin E  4 days   ______________________________________________________________________      ______________________________________________________________________    Patient Information update  To: All of our patients.  Re: Name change.  It has been made official that our current name, "[facility]"   will soon be changed to "[facility]".   The purpose of this change is to eliminate any confusion created by the concept of our practice being a "Medication Management Pain Clinic". In the past this has led to the misconception that we treat pain primarily by the use of prescription medications.  Nothing can be farther from the truth.   Understanding PAIN MANAGEMENT: To further understand what our practice does, you first have to understand that "Pain Management" is a subspecialty that requires additional training once a physician has completed their specialty training, which can be in either Anesthesia, Neurology, Psychiatry, or Physical Medicine and Rehabilitation (PMR). Each one of these contributes to the final approach taken by each physician to the management of their patient's pain. To be a "Pain Management Specialist" you must have first completed one of the specialty trainings below.  Anesthesiologists - trained in clinical pharmacology and interventional techniques such as nerve blockade and regional as well as central neuroanatomy. They are trained to block pain before, during, and after surgical interventions.  Neurologists - trained in the diagnosis and pharmacological treatment of complex neurological conditions, such as Multiple Sclerosis, Parkinson's, spinal cord injuries, and other systemic conditions that may be associated with symptoms that may include but are not limited to pain. They tend to rely primarily on the treatment of chronic pain using prescription medications.  Psychiatrist -  trained in conditions affecting the psychosocial wellbeing of patients including but not limited to depression, anxiety, schizophrenia, personality disorders, addiction, and other substance use disorders that may be associated with chronic pain. They tend to rely primarily on the treatment of chronic pain using prescription medications.   Physical Medicine and  Rehabilitation (PMR) physicians, also known as physiatrists - trained to treat a wide variety of medical conditions affecting the brain, spinal cord, nerves, bones, joints, ligaments, muscles, and tendons. Their training is primarily aimed at treating patients that have suffered injuries that have caused severe physical impairment. Their training is primarily aimed at the physical therapy and rehabilitation of those patients. They may also work alongside orthopedic surgeons or neurosurgeons using their expertise in assisting surgical patients to recover after their surgeries.  INTERVENTIONAL PAIN MANAGEMENT is sub-subspecialty of Pain Management.  Our physicians are Board-certified in Anesthesia, Pain Management, and Interventional Pain Management.  This meaning that not only have they been trained and Board-certified in their specialty of Anesthesia, and subspecialty of Pain Management, but they have also received further training in the sub-subspecialty of Interventional Pain Management, in order to become Board-certified as INTERVENTIONAL PAIN MANAGEMENT SPECIALIST.    Mission: Our goal is to use our skills in  INTERVENTIONAL PAIN MANAGEMENT as alternatives to the chronic use of prescription opioid medications for the treatment of pain. To make this more clear, we have changed our name to reflect what we do and offer. We will continue to offer medication management assessment and recommendations, but we will not be taking over any patient's medication management.  ______________________________________________________________________

## 2023-08-13 ENCOUNTER — Ambulatory Visit: Payer: HMO | Admitting: Dermatology

## 2023-08-13 ENCOUNTER — Ambulatory Visit: Payer: Self-pay | Admitting: *Deleted

## 2023-08-13 NOTE — Telephone Encounter (Signed)
 FYI Only or Action Required?: Action required by provider  Patient was last seen in primary care on 07/14/2023 by Lamon Pillow, MD. Called Nurse Triage reporting Breathing Problem. Symptoms began several weeks ago. Interventions attempted: Other: seen by cardiology . Symptoms are: unchanged.  Triage Disposition: See HCP Within 4 Hours (Or PCP Triage)  Patient/caregiver understands and will follow disposition?: Yes with in 48 hours                    Copied from CRM #[id number]. Topic: Clinical - Red Word Triage >> Aug 13, 2023  9:47 AM Baldemar Lev wrote: Red Word that prompted transfer to Nurse Triage: Difficulty breathing and some dizziness. Can't walk very far without gasping for air. Reason for Disposition  [1] MILD difficulty breathing (e.g., minimal/no SOB at rest, SOB with walking, pulse <100) AND [2] NEW-onset or WORSE than normal  Answer Assessment - Initial Assessment Questions 1. RESPIRATORY STATUS: Describe your breathing? (e.g., wheezing, shortness of breath, unable to speak, severe coughing)      Shortness of breathing  2. ONSET: When did this breathing problem begin?      Few weeks  3. PATTERN Does the difficult breathing come and go, or has it been constant since it started?      Worsen with exertion 4. SEVERITY: How bad is your breathing? (e.g., mild, moderate, severe)    - MILD: No SOB at rest, mild SOB with walking, speaks normally in sentences, can lie down, no retractions, pulse < 100.    - MODERATE: SOB at rest, SOB with minimal exertion and prefers to sit, cannot lie down flat, speaks in phrases, mild retractions, audible wheezing, pulse 100-120.    - SEVERE: Very SOB at rest, speaks in single words, struggling to breathe, sitting hunched forward, retractions, pulse > 120      Moderate SOB 5. RECURRENT SYMPTOM: Have you had difficulty breathing before? If Yes, ask: When was the last time? and What happened that time?      Yes  6.  CARDIAC HISTORY: Do you have any history of heart disease? 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 LUNG HISTORY: Do you have any history of lung disease?  (e.g., pulmonary embolus, asthma, emphysema)     na 8. CAUSE: What do you think is causing the breathing problem?      Not sure had heart checked out and heart ok 9. OTHER SYMPTOMS: Do you have any other symptoms? (e.g., dizziness, runny nose, cough, chest pain, fever)     Dizziness , cough , sinus drainage. SOB with exertion 10. O2 SATURATION MONITOR:  Do you use an oxygen saturation monitor (pulse oximeter) at home? If Yes, ask: What is your reading (oxygen level) today? What is your usual oxygen saturation reading? (e.g., 95%)       na 11. PREGNANCY: Is there any chance you are pregnant? When was your last menstrual period?       na 12. TRAVEL: Have you traveled out of the country in the last month? (e.g., travel history, exposures)       Na  Scheduled appt per CAL , Carol for 08/15/23. No available appt until then with PCP. Patient did not want to see other provider due to heart hx. Patient reports he has been to heart dr to check out SOB and was told its not your heart. Recommended patient to go to ED or call 911  if sx worsen. Stay out of hot heat and rest until seen by PCP. Please advise if any available OV earlier than 08/15/23.  Protocols used: Breathing Difficulty-A-AH

## 2023-08-15 ENCOUNTER — Encounter: Payer: Self-pay | Admitting: Family Medicine

## 2023-08-15 ENCOUNTER — Ambulatory Visit
Admission: RE | Admit: 2023-08-15 | Discharge: 2023-08-15 | Disposition: A | Discharge status: Home / Self Care | Source: Ambulatory Visit | Attending: Family Medicine | Admitting: Family Medicine

## 2023-08-15 ENCOUNTER — Ambulatory Visit
Admission: RE | Admit: 2023-08-15 | Discharge: 2023-08-15 | Disposition: A | Discharge status: Home / Self Care | Attending: Family Medicine | Admitting: Family Medicine

## 2023-08-15 ENCOUNTER — Ambulatory Visit (INDEPENDENT_AMBULATORY_CARE_PROVIDER_SITE_OTHER): Admitting: Family Medicine

## 2023-08-15 VITALS — BP 109/70 | HR 94 | Temp 97.8°F | Resp 17 | Ht 70.0 in | Wt 185.4 lb

## 2023-08-15 DIAGNOSIS — R0602 Shortness of breath: Secondary | ICD-10-CM

## 2023-08-15 DIAGNOSIS — R1013 Epigastric pain: Secondary | ICD-10-CM | POA: Diagnosis not present

## 2023-08-15 DIAGNOSIS — D649 Anemia, unspecified: Secondary | ICD-10-CM

## 2023-08-15 LAB — POCT URINALYSIS DIPSTICK
Bilirubin, UA: POSITIVE
Blood, UA: NEGATIVE
Glucose, UA: NEGATIVE
Ketones, UA: POSITIVE
Nitrite, UA: NEGATIVE
Protein, UA: NEGATIVE
Spec Grav, UA: 1.02
Urobilinogen, UA: 0.2 U/dL
pH, UA: 6

## 2023-08-15 NOTE — Progress Notes (Signed)
 Established patient visit   Patient: Rodney Hall.   DOB: 06-May-1945   78 y.o. Male  MRN: [id number] Visit Date: 08/15/2023  Today's healthcare provider: Jeralene Mom, MD   Chief Complaint  Patient presents with   Shortness of Breath    With exertion and walking. Frequency: x 4 months, worsening in the last couple of weeks Patient reports he went to see Cardio and had an echo done. Reports about that time symptoms were not as bad as they are now.   Subjective    Discussed the use of AI scribe software for clinical note transcription with the patient, who gave verbal consent to proceed.  History of Present Illness   Rodney Antonelli. is a 78 year old male who presents with worsening shortness of breath.  He has been experiencing worsening shortness of breath over the past week. Activities such as walking from his car to the elevator or carrying a ten-pound sprayer around the house leave him gasping for air. No chest pain or heart flutters. He has had shortness of breath for some time, but it has recently worsened.  He has a history of diverticulitis and underwent a colonoscopy where polyps were removed. He experienced blood in his stools prior to the procedure but has not had any issues since. No black or tarry stools. He is currently taking an iron supplement every other day following low iron levels identified about a month ago. No current stomach problems, changes in bowel habits, or blood in his stool.  He has been experiencing heartburn for the last few weeks, described as being located in the pit of his stomach. He has a long history of heartburn, spanning forty years, and has been taking over-the-counter medication, which has provided some relief.  He passed a kidney stone approximately six weeks ago but reports no ongoing issues related to this.  No swelling in his hands or feet.     Lab Results  Component Value Date   WBC 16.2 (H) 05/29/2023   HGB 12.9 (L)  05/29/2023   HCT 41.6 05/29/2023   MCV 83.9 05/29/2023   PLT 320 05/29/2023   Lab Results  Component Value Date   NA 135 05/29/2023   CL 102 05/29/2023   K 4.7 05/29/2023   CO2 22 05/29/2023   BUN 20 05/29/2023   CREATININE 1.82 (H) 05/29/2023   GFRNONAA 38 (L) 05/29/2023   CALCIUM  9.2 05/29/2023   PHOS 3.5 02/22/2022   ALBUMIN  4.4 05/29/2023   GLUCOSE 139 (H) 05/29/2023    Medications: Outpatient Medications Prior to Visit  Medication Sig   acetaminophen  (TYLENOL ) 325 MG tablet Take 650 mg by mouth every 6 (six) hours as needed for moderate pain or headache.   aspirin  EC 81 MG tablet Take 1 tablet (81 mg total) by mouth daily. Swallow whole.   atorvastatin  (LIPITOR) 10 MG tablet Take 1 tablet (10 mg total) by mouth daily.   busPIRone  (BUSPAR ) 7.5 MG tablet Take 1 tablet (7.5 mg total) by mouth every morning AND 2 tablets (15 mg total) every evening. Take 1 tablet (7.5 mg total) by mouth every morning AND 2 tablets (15 mg total) every evening..   clopidogrel  (PLAVIX ) 75 MG tablet Take 1 tablet (75 mg total) by mouth daily.   imipramine  (TOFRANIL ) 50 MG tablet Take 1 tablet (50 mg total) by mouth at bedtime.   omeprazole  (PRILOSEC) 20 MG capsule Take 20 mg by mouth 2 (two) times daily.  oxyCODONE -acetaminophen  (PERCOCET/ROXICET) 5-325 MG tablet Take 1 tablet by mouth every 4 (four) hours as needed for severe pain (pain score 7-10).   tamsulosin  (FLOMAX ) 0.4 MG CAPS capsule Take 1 capsule (0.4 mg total) by mouth daily after supper.   No facility-administered medications prior to visit.   Review of Systems     Objective    BP 109/70 (BP Location: Left Arm, Patient Position: Sitting, Cuff Size: Normal)   Pulse 94   Temp 97.8 F (36.6 C) (Oral)   Resp 17   Ht 5' 10 (1.778 m)   Wt 185 lb 6.4 oz (84.1 kg)   SpO2 100%   BMI 26.60 kg/m   Physical Exam   General: Appearance:    Well developed, well nourished male in no acute distress  Eyes:    PERRL, conjunctiva/corneas  clear, EOM's intact       Lungs:     Clear to auscultation bilaterally, respirations unlabored  Heart:    Normal heart rate. Normal rhythm. No murmurs, rubs, or gallops.    MS:   All extremities are intact.    Neurologic:   Awake, alert, oriented x 3. No apparent focal neurological defect.        Results for orders placed or performed in visit on 08/15/23  POCT urinalysis dipstick  Result Value Ref Range   Color, UA Yellow    Clarity, UA Clear    Glucose, UA Negative Negative   Bilirubin, UA Positive    Ketones, UA Positive    Spec Grav, UA 1.020 1.010 - 1.025   Blood, UA Negative    pH, UA 6.0 5.0 - 8.0   Protein, UA Negative Negative   Urobilinogen, UA 0.2 0.2 or 1.0 E.U./dL   Nitrite, UA Negative    Leukocytes, UA Small (1+) (A) Negative   Appearance     Odor        Assessment & Plan       Dyspnea Worsening exertional dyspnea. Echocardiogram and EKG unchanged. Differential includes anemia, cardiac, or pulmonary issues. Anemia from low iron levels suspected. Further investigation for blood loss needed. - Order chest X-ray. - Send home stool test for occult blood. - Check blood counts and BNP  Iron Deficiency Anemia Low iron levels, on iron supplements. Previous colonoscopy showed diverticulitis and polyps, potential blood loss sources. Monitoring for blood loss through urine and stool tests. - Continue iron supplements every other day. - Monitor blood counts.  Diverticulitis Previous bleeding with polyps removed. Prefers to avoid surgery unless necessary. Monitoring symptoms and considering surgery if worsens. - Monitor for symptoms. - Discuss surgical options if symptoms worsen.  Epigastric pain Self-medicating with Prilosec. Differential includes H. pylori, gastritis, reflux. H. pylori testing needed due to potential ulcer and anemia contribution. - Breath Test for H. pylori infection.  Kidney Stones Passed stone six weeks ago, asymptomatic. Monitoring for  recurrence. - Monitor for recurrence.      Jeralene Mom, MD  [facility] [phone] (phone) [phone] (fax)  [facility]

## 2023-08-17 LAB — CBC
Hematocrit: 42.7 % (ref 37.5–51.0)
Hemoglobin: 13.2 g/dL (ref 13.0–17.7)
MCH: 26.6 pg (ref 26.6–33.0)
MCHC: 30.9 g/dL — ABNORMAL LOW (ref 31.5–35.7)
MCV: 86 fL (ref 79–97)
Platelets: 279 10*3/uL (ref 150–450)
RBC: 4.97 x10E6/uL (ref 4.14–5.80)
RDW: 17.7 % — ABNORMAL HIGH (ref 11.6–15.4)
WBC: 10.4 10*3/uL (ref 3.4–10.8)

## 2023-08-17 LAB — H. PYLORI BREATH TEST: H pylori Breath Test: NEGATIVE

## 2023-08-17 LAB — BRAIN NATRIURETIC PEPTIDE: BNP: 57.6 pg/mL (ref 0.0–100.0)

## 2023-08-20 ENCOUNTER — Other Ambulatory Visit (INDEPENDENT_AMBULATORY_CARE_PROVIDER_SITE_OTHER)

## 2023-08-20 DIAGNOSIS — Z860100 Personal history of colon polyps, unspecified: Secondary | ICD-10-CM | POA: Diagnosis not present

## 2023-08-20 LAB — HEMOCCULT GUIAC POC 1CARD (OFFICE): Fecal Occult Blood, POC: NEGATIVE

## 2023-08-22 ENCOUNTER — Ambulatory Visit: Payer: Self-pay | Admitting: Family Medicine

## 2023-08-22 DIAGNOSIS — J849 Interstitial pulmonary disease, unspecified: Secondary | ICD-10-CM

## 2023-08-22 DIAGNOSIS — R0602 Shortness of breath: Secondary | ICD-10-CM

## 2023-08-25 ENCOUNTER — Ambulatory Visit: Payer: Self-pay

## 2023-08-25 NOTE — Telephone Encounter (Addendum)
 FYI Only or Action Required?: FYI only for provider.  Patient was last seen in primary care on 08/15/2023 by Gasper Nancyann BRAVO, MD. Called Nurse Triage reporting Shortness of Breath. Symptoms began several months ago. Interventions attempted: Other: sx management with PCP. Symptoms are: gradually worsening.  Triage Disposition: Call Specialist Today  Patient/caregiver understands and will follow disposition?: Yes      Copied from CRM [id number]. Topic: Clinical - Red Word Triage >> Aug 25, 2023  2:24 PM Rilla B wrote: Red Word that prompted transfer to Nurse Triage: Shortness of Breath Reason for Disposition  Requesting regular office appointment  Answer Assessment - Initial Assessment Questions 1. RESPIRATORY STATUS: Describe your breathing? (e.g., wheezing, shortness of breath, unable to speak, severe coughing)      SOB with exertion PCP reports lung scarring in CXR last week 2. ONSET: When did this breathing problem begin?      Several months - steadily getting worse 3. PATTERN Does the difficult breathing come and go, or has it been constant since it started?      constant 4. SEVERITY: How bad is your breathing? (e.g., mild, moderate, severe)    - MILD: No SOB at rest, mild SOB with walking, speaks normally in sentences, can lie down, no retractions, pulse < 100.    - MODERATE: SOB at rest, SOB with minimal exertion and prefers to sit, cannot lie down flat, speaks in phrases, mild retractions, audible wheezing, pulse 100-120.    - SEVERE: Very SOB at rest, speaks in single words, struggling to breathe, sitting hunched forward, retractions, pulse > 120      Mild - worse with exertion Triager does not appreciate audible SOB/wheezing during call. Pt is speaking in full sentences.  5. RECURRENT SYMPTOM: Have you had difficulty breathing before? If Yes, ask: When was the last time? and What happened that time?      First time 6. CARDIAC HISTORY: Do you have any  history of heart disease? (e.g., heart attack, angina, bypass surgery, angioplasty)      Hx of heart attack last year 7. LUNG HISTORY: Do you have any history of lung disease?  (e.g., pulmonary embolus, asthma, emphysema)     N/a 8. CAUSE: What do you think is causing the breathing problem?      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 OTHER SYMPTOMS: Do you have any other symptoms? (e.g., dizziness, runny nose, cough, chest pain, fever)     Dizziness (intermittent, has to sit back down), dry cough 10. O2 SATURATION MONITOR:  Do you use an oxygen saturation monitor (pulse oximeter) at home? If Yes, ask: What is your reading (oxygen level) today? What is your usual oxygen saturation reading? (e.g., 95%)       N/a 11. PREGNANCY: Is there any chance you are pregnant? When was your last menstrual period?       N/a 12. TRAVEL: Have you traveled out of the country in the last month? (e.g., travel history, exposures)       N/a  Protocols used: Breathing Difficulty-A-AH, Information Only Call - No Triage-A-AH

## 2023-08-27 ENCOUNTER — Ambulatory Visit: Admitting: Family Medicine

## 2023-08-28 ENCOUNTER — Ambulatory Visit
Admission: RE | Admit: 2023-08-28 | Discharge: 2023-08-28 | Disposition: A | Discharge status: Home / Self Care | Source: Ambulatory Visit | Attending: Pain Medicine | Admitting: Pain Medicine

## 2023-08-28 ENCOUNTER — Ambulatory Visit (HOSPITAL_BASED_OUTPATIENT_CLINIC_OR_DEPARTMENT_OTHER): Admitting: Pain Medicine

## 2023-08-28 ENCOUNTER — Encounter: Payer: Self-pay | Admitting: Pain Medicine

## 2023-08-28 VITALS — BP 139/97 | HR 88 | Temp 98.7°F | Resp 18 | Ht 70.0 in | Wt 185.0 lb

## 2023-08-28 DIAGNOSIS — M47816 Spondylosis without myelopathy or radiculopathy, lumbar region: Secondary | ICD-10-CM | POA: Diagnosis present

## 2023-08-28 DIAGNOSIS — G8929 Other chronic pain: Secondary | ICD-10-CM | POA: Diagnosis present

## 2023-08-28 DIAGNOSIS — M5459 Other low back pain: Secondary | ICD-10-CM

## 2023-08-28 DIAGNOSIS — M47817 Spondylosis without myelopathy or radiculopathy, lumbosacral region: Secondary | ICD-10-CM | POA: Insufficient documentation

## 2023-08-28 DIAGNOSIS — M545 Low back pain, unspecified: Secondary | ICD-10-CM | POA: Diagnosis present

## 2023-08-28 DIAGNOSIS — Z981 Arthrodesis status: Secondary | ICD-10-CM | POA: Insufficient documentation

## 2023-08-28 MED ORDER — TRIAMCINOLONE ACETONIDE 40 MG/ML IJ SUSP
INTRAMUSCULAR | Status: AC
  Filled 2023-08-28: qty 2

## 2023-08-28 MED ORDER — TRIAMCINOLONE ACETONIDE 40 MG/ML IJ SUSP
80.0000 mg | Freq: Once | INTRAMUSCULAR | Status: AC
  Administered 2023-08-28: 80 mg

## 2023-08-28 MED ORDER — LIDOCAINE HCL 2 % IJ SOLN
INTRAMUSCULAR | Status: AC
  Filled 2023-08-28: qty 20

## 2023-08-28 MED ORDER — LIDOCAINE HCL 2 % IJ SOLN
20.0000 mL | Freq: Once | INTRAMUSCULAR | Status: AC
  Administered 2023-08-28: 400 mg

## 2023-08-28 MED ORDER — PENTAFLUOROPROP-TETRAFLUOROETH EX AERO
INHALATION_SPRAY | Freq: Once | CUTANEOUS | Status: AC
  Administered 2023-08-28: 30 via TOPICAL

## 2023-08-28 MED ORDER — ROPIVACAINE HCL 2 MG/ML IJ SOLN
18.0000 mL | Freq: Once | INTRAMUSCULAR | Status: AC
  Administered 2023-08-28: 18 mL via PERINEURAL

## 2023-08-28 MED ORDER — ROPIVACAINE HCL 2 MG/ML IJ SOLN
INTRAMUSCULAR | Status: AC
  Filled 2023-08-28: qty 20

## 2023-08-28 NOTE — Progress Notes (Addendum)
 PROVIDER NOTE: Interpretation of information contained herein should be left to medically-trained personnel. Specific patient instructions are provided elsewhere under Patient Instructions section of medical record. This document was created in part using STT-dictation technology, any transcriptional errors that may result from this process are unintentional.  Patient: Rodney Hall. Type: Established DOB: 1946/01/22 MRN: [id number] PCP: Gasper Nancyann BRAVO, MD  Service: Procedure DOS: 08/28/2023 Setting: Ambulatory Location: Ambulatory outpatient facility Delivery: Face-to-face Provider: Eric DELENA Como, MD Specialty: Interventional Pain Management Specialty designation: 09 Location: Outpatient facility Ref. Prov.: Gasper Nancyann BRAVO, MD       Interventional Therapy   Type: Lumbar Facet, Medial Branch Block(s) (w/ fluoroscopic mapping) #2  Laterality: Bilateral  Level: L3, L4, L5, and S1 Medial Branch/Dorsal Rami Level(s). Injecting these levels blocks the L4-5 and L5-S1 lumbar facet joints. Imaging: Fluoroscopic guidance Spinal ([id number]) Anesthesia: Local anesthesia (1-2% Lidocaine ) Anxiolysis: None                 Sedation: No Sedation                       DOS: 08/28/2023 Performed by: Eric DELENA Como, MD  Primary Purpose: Diagnostic/Therapeutic Indications: Low back pain severe enough to impact quality of life or function. 1. 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 History of spinal fusion   3. Low back pain of over 3 months duration   4. Lumbar facet joint pain   5. Lumbar facet hypertrophy (Multilevel) (Bilateral)   6. Lumbar facet joint syndrome   7. Spondylosis without myelopathy or radiculopathy, lumbosacral region    NAS-11 Pain score:   Pre-procedure: 2 /10   Post-procedure: 2 /10     Position / Prep / Materials:  Position: Prone  Prep solution: ChloraPrep (2% chlorhexidine  gluconate and 70% isopropyl alcohol) Area  Prepped: Posterolateral Lumbosacral Spine (Wide prep: From the lower border of the scapula down to the end of the tailbone and from flank to flank.)  Materials:  Tray: Block Needle(s):  Type: Spinal  Gauge (G): 22  Length: 5-in Qty: 4     H&P (Pre-op Assessment):  Rodney Hall is a 78 y.o. (year old), male patient, seen today for interventional treatment. He  has a past surgical history that includes Back surgery (2012); Neck surgery (2000); Cataract extraction, bilateral; Tonsillectomy and adenoidectomy; Anterior lat lumbar fusion (Right, 07/30/2016); EUS (N/A, 07/10/2017); Tennis elbow release/nirschel procedure; Eye surgery; Cholecystectomy (N/A, 07/25/2017); Colonoscopy with propofol  (N/A, 12/12/2017); Hemi-microdiscectomy lumbar laminectomy level 1 (09/2018); LEFT HEART CATH AND CORONARY ANGIOGRAPHY (N/A, 09/04/2022); Coronary artery bypass graft (N/A, 09/07/2022); TEE without cardioversion (N/A, 09/07/2022); LEFT HEART CATH AND CORONARY ANGIOGRAPHY (N/A, 09/06/2022); IABP Insertion (N/A, 09/06/2022); Colonoscopy with propofol  (N/A, 06/27/2023); Polypectomy (06/27/2023); and Spine surgery (4 times). Rodney Hall has a current medication list which includes the following prescription(s): acetaminophen , aspirin  ec, atorvastatin , clopidogrel , imipramine , omeprazole , tamsulosin , buspirone , and oxycodone -acetaminophen . His primarily concern today is the Back Pain  Initial Vital Signs:  Pulse/HCG Rate: 88ECG Heart Rate: 97 Temp: 98.7 F (37.1 C) Resp: 16 BP: 128/82 SpO2: 100 %  BMI: Estimated body mass index is 26.54 kg/m as calculated from the following:   Height as of this encounter: 5' 10 (1.778 m).   Weight as of this encounter: 185 lb (83.9 kg).  Risk Assessment: Allergies: Reviewed. He is allergic to pravastatin  and amoxicillin.  Allergy Precautions: None required Coagulopathies: Reviewed. None identified.  Blood-thinner therapy: None at this time  Active Infection(s): Reviewed. None  identified. Rodney Hall is afebrile  Site Confirmation: Rodney Hall was asked to confirm the procedure and laterality before marking the site Procedure checklist: Completed Consent: Before the procedure and under the influence of no sedative(s), amnesic(s), or anxiolytics, the patient was informed of the treatment options, risks and possible complications. To fulfill our ethical and legal obligations, as recommended by the American Medical Association's Code of Ethics, I have informed the patient of my clinical impression; the nature and purpose of the treatment or procedure; the risks, benefits, and possible complications of the intervention; the alternatives, including doing nothing; the risk(s) and benefit(s) of the alternative treatment(s) or procedure(s); and the risk(s) and benefit(s) of doing nothing. The patient was provided information about the general risks and possible complications associated with the procedure. These may include, but are not limited to: failure to achieve desired goals, infection, bleeding, organ or nerve damage, allergic reactions, paralysis, and death. In addition, the patient was informed of those risks and complications associated to Spine-related procedures, such as failure to decrease pain; infection (i.e.: Meningitis, epidural or intraspinal abscess); bleeding (i.e.: epidural hematoma, subarachnoid hemorrhage, or any other type of intraspinal or peri-dural bleeding); organ or nerve damage (i.e.: Any type of peripheral nerve, nerve root, or spinal cord injury) with subsequent damage to sensory, motor, and/or autonomic systems, resulting in permanent pain, numbness, and/or weakness of one or several areas of the body; allergic reactions; (i.e.: anaphylactic reaction); and/or death. Furthermore, the patient was informed of those risks and complications associated with the medications. These include, but are not limited to: allergic reactions (i.e.: anaphylactic or  anaphylactoid reaction(s)); adrenal axis suppression; blood sugar elevation that in diabetics may result in ketoacidosis or comma; water  retention that in patients with history of congestive heart failure may result in shortness of breath, pulmonary edema, and decompensation with resultant heart failure; weight gain; swelling or edema; medication-induced neural toxicity; particulate matter embolism and blood vessel occlusion with resultant organ, and/or nervous system infarction; and/or aseptic necrosis of one or more joints. Finally, the patient was informed that Medicine is not an exact science; therefore, there is also the possibility of unforeseen or unpredictable risks and/or possible complications that may result in a catastrophic outcome. The patient indicated having understood very clearly. We have given the patient no guarantees and we have made no promises. Enough time was given to the patient to ask questions, all of which were answered to the patient's satisfaction. Rodney Hall has indicated that he wanted to continue with the procedure. Attestation: I, the ordering provider, attest that I have discussed with the patient the benefits, risks, side-effects, alternatives, likelihood of achieving goals, and potential problems during recovery for the procedure that I have provided informed consent. Date  Time: 08/28/2023  8:50 AM  Pre-Procedure Preparation:  Monitoring: As per clinic protocol. Respiration, ETCO2, SpO2, BP, heart rate and rhythm monitor placed and checked for adequate function Safety Precautions: Patient was assessed for positional comfort and pressure points before starting the procedure. Time-out: I initiated and conducted the Time-out before starting the procedure, as per protocol. The patient was asked to participate by confirming the accuracy of the Time Out information. Verification of the correct person, site, and procedure were performed and confirmed by me, the nursing  staff, and the patient. Time-out conducted as per Joint Commission's Universal Protocol (UP.01.01.01). Time: 0914 Start Time: 0914 hrs.  Description of Procedure:          Laterality: (see above) Targeted  Levels: (see above)  Safety Precautions: Aspiration looking for blood return was conducted prior to all injections. At no point did we inject any substances, as a needle was being advanced. Before injecting, the patient was told to immediately notify me if he was experiencing any new onset of ringing in the ears, or metallic taste in the mouth. No attempts were made at seeking any paresthesias. Safe injection practices and needle disposal techniques used. Medications properly checked for expiration dates. SDV (single dose vial) medications used. After the completion of the procedure, all disposable equipment used was discarded in the proper designated medical waste containers. Local Anesthesia: Protocol guidelines were followed. The patient was positioned over the fluoroscopy table. The area was prepped in the usual manner. The time-out was completed. The target area was identified using fluoroscopy. A 12-in long, straight, sterile hemostat was used with fluoroscopic guidance to locate the targets for each level blocked. Once located, the skin was marked with an approved surgical skin marker. Once all sites were marked, the skin (epidermis, dermis, and hypodermis), as well as deeper tissues (fat, connective tissue and muscle) were infiltrated with a small amount of a short-acting local anesthetic, loaded on a 10cc syringe with a 25G, 1.5-in  Needle. An appropriate amount of time was allowed for local anesthetics to take effect before proceeding to the next step. Local Anesthetic: Lidocaine  2.0% The unused portion of the local anesthetic was discarded in the proper designated containers. Technical description of process:  Medial Branch  Dorsal Rami Nerve Block (MBB):  Neuroanatomy note: Each lumbar  facet joint receives dual innervation from medial branches arising from the posterior primary rami at the same level and one level above. The target for each lumbar medial branch is the junction of the ipsilateral superior articular and transverse process of the lower vertebral body. (i.e.: The L4-L5 facet joint is innervated by the L4 medial branch [located at L5] and the L3 medial branch [located at L4]. Blocking the L4 Medial Branch is therefore achieved by injecting at the junction of the ipsilateral superior articular and transverse process of the lower vertebral body [L5].).  Exception: The exception to the above rule is the L5-S1 facet joint which has triple innervation requiring the L4 medial branch, as well as the L5 and the S1 Dorsal Rami(s) to be blocked to fully denervate the joint.  Under fluoroscopic guidance, a needle was inserted until contact was made with os over the target area. After negative aspiration, 0.5 mL of the nerve block solution was injected without difficulty or complication. Paresthesia were avoided during injection. The needle(s) were removed intact and without complication.  Once the entire procedure was completed, the treated area was cleaned, making sure to leave some of the prepping solution back to take advantage of its long term bactericidal properties.         Illustration of the posterior view of the lumbar spine and the posterior neural structures. Laminae of L2 through S1 are labeled. DPRL5, dorsal primary ramus of L5; DPRS1, dorsal primary ramus of S1; DPR3, dorsal primary ramus of L3; FJ, facet (zygapophyseal) joint L3-L4; I, inferior articular process of L4; LB1, lateral branch of dorsal primary ramus of L1; IAB, inferior articular branches from L3 medial branch (supplies L4-L5 facet joint); IBP, intermediate branch plexus; MB3, medial branch of dorsal primary ramus of L3; NR3, third lumbar nerve root; S, superior articular process of L5; SAB, superior  articular branches from L4 (supplies L4-5 facet joint also); TP3, transverse process  of L3.   Facet Joint Innervation (* possible contribution)  L1-2 T12, L1 (L2*)  Medial Branch  L2-3 L1, L2 (L3*)                     L3-4 L2, L3 (L4*)                     L4-5 L3, L4 (L5*)                     L5-S1 L4, L5, S1                        Vitals:   08/28/23 0849 08/28/23 0915 08/28/23 0920  BP: 128/82 (!) 131/97 (!) 139/97  Pulse: 88    Resp: 16 18 18   Temp: 98.7 F (37.1 C)    TempSrc: Temporal    SpO2: 100% 98% 98%  Weight: 185 lb (83.9 kg)    Height: 5' 10 (1.778 m)       End Time: 0920 hrs.  Imaging Guidance (Spinal):         Type of Imaging Technique: Fluoroscopy Guidance (Spinal) Indication(s): Fluoroscopy guidance for needle placement to enhance accuracy in procedures requiring precise needle localization for targeted delivery of medication in or near specific anatomical locations not easily accessible without such real-time imaging assistance. Exposure Time: Please see nurses notes. Contrast: None used. Fluoroscopic Guidance: I was personally present during the use of fluoroscopy. Tunnel Vision Technique used to obtain the best possible view of the target area. Parallax error corrected before commencing the procedure. Direction-depth-direction technique used to introduce the needle under continuous pulsed fluoroscopy. Once target was reached, antero-posterior, oblique, and lateral fluoroscopic projection used confirm needle placement in all planes. Images permanently stored in EMR. Interpretation: No contrast injected. I personally interpreted the imaging intraoperatively. Adequate needle placement confirmed in multiple planes. Permanent images saved into the patient's record.  Post-operative Assessment:  Post-procedure Vital Signs:  Pulse/HCG Rate: 8895 Temp: 98.7 F (37.1 C) Resp: 18 BP: (!) 139/97 SpO2: 98 %  EBL: None  Complications: No immediate  post-treatment complications observed by team, or reported by patient.  Note: The patient tolerated the entire procedure well. A repeat set of vitals were taken after the procedure and the patient was kept under observation following institutional policy, for this type of procedure. Post-procedural neurological assessment was performed, showing return to baseline, prior to discharge. The patient was provided with post-procedure discharge instructions, including a section on how to identify potential problems. Should any problems arise concerning this procedure, the patient was given instructions to immediately contact us , at any time, without hesitation. In any case, we plan to contact the patient by telephone for a follow-up status report regarding this interventional procedure.  Comments:  No additional relevant information.  Plan of Care (POC)  Orders:  Orders Placed This Encounter  Procedures   LUMBAR FACET(MEDIAL BRANCH NERVE BLOCK) MBNB    Scheduling Instructions:     Procedure: Lumbar facet block (AKA.: Lumbosacral medial branch nerve block)     Side: Bilateral     Level: L3-4, L4-5, and L5-S1 Facets (L2, L3, L4, L5, and S1 Medial Branch Nerves)     Sedation: Patient's choice.     Date: 08/28/2023    Where will this procedure be performed?:   [facility]   DG [facility] C-ARM 1-60 MIN NO REPORT    Intraoperative interpretation by procedural physician  at [facility].    Standing Status:   Standing    Number of Occurrences:   1    Reason for exam::   Assistance in needle guidance and placement for procedures requiring needle placement in or near specific anatomical locations not easily accessible without such assistance.   Informed Consent Details: Physician/Practitioner Attestation; Transcribe to consent form and obtain patient signature    Nursing Order: Transcribe to consent form and obtain patient signature. Note: Always confirm laterality of pain with Mr.  Hall, before procedure.    Physician/Practitioner attestation of informed consent for procedure/surgical case:   I, the physician/practitioner, attest that I have discussed with the patient the benefits, risks, side effects, alternatives, likelihood of achieving goals and potential problems during recovery for the procedure that I have provided informed consent.    Procedure:   Lumbar Facet Block  under fluoroscopic guidance    Physician/Practitioner performing the procedure:   Gabryelle Whitmoyer A. Tanya MD    Indication/Reason:   Low Back Pain, with our without leg pain, due to Facet Joint Arthralgia (Joint Pain) Spondylosis (Arthritis of the Spine), without myelopathy or radiculopathy (Nerve Damage).   Care order/instruction: Please confirm that the patient has stopped the Plavix  (Clopidogrel ) x 7-10 days prior to procedure or surgery.    Please confirm that the patient has stopped the Plavix  (Clopidogrel ) x 7-10 days prior to procedure or surgery.    Standing Status:   Standing    Number of Occurrences:   1   Provide equipment / supplies at bedside    Procedure tray: Block Tray (Disposable  single use) Skin infiltration needle: Regular 1.5-in, 25-G, (x1) Block Needle type: Spinal Amount/quantity: 4 Size: Regular (3.5-inch) Gauge: 22G    Standing Status:   Standing    Number of Occurrences:   1    Specify:   Block Tray   Bleeding precautions    Standing Status:   Standing    Number of Occurrences:   1    Opioid Analgesic(s):  Analgesic: No chronic opioid analgesics therapy prescribed by our practice. None. MME/day: 0 mg/day    Medications ordered for procedure: Meds ordered this encounter  Medications   lidocaine  (XYLOCAINE ) 2 % (with pres) injection 400 mg   pentafluoroprop-tetrafluoroeth (GEBAUERS) aerosol   ropivacaine  (PF) 2 mg/mL (0.2%) (NAROPIN ) injection 18 mL   triamcinolone  acetonide (KENALOG -40) injection 80 mg   Medications administered: We administered lidocaine ,  pentafluoroprop-tetrafluoroeth, ropivacaine  (PF) 2 mg/mL (0.2%), and triamcinolone  acetonide.  See the medical record for exact dosing, route, and time of administration.    Interventional Therapies  Risk Factors  Considerations  Medical Comorbidities:  Plavix  Anticoagulation: (Stop: 7-10 days  Restart: 2 hours)  CAD  Hx. NSTEMI  S/P CABG x5  Stage 3b CKD  T2NIDDM     Planned  Pending:   Diagnostic bilateral lumbar facet MBB #1    Under consideration:   Diagnostic bilateral lumbar facet MBB #1  Possible Racz procedure #2    Completed:   Therapeutic left Racz procedure there #1 (06/12/2023) (100/100/80/80) Diagnostic/therapeutic left caudal ESI + diagnostic epidurogram x1 (05/08/2023) (100/100/50/50)   Therapeutic  Palliative (PRN) options:   None established   Completed by other providers:   Surgery: Right L2-3 lateral transpsoas interbody fusion w/ lateral plating and L2-3 decompression (07/30/2016) by Morene DOROTHA Stammer, MD (Neurosurgery)  Surgery: Left L3-4 and L4-5 anterolateral fusion with interbody spacer and right L3-4 and L4-5 pedicle screws (03/13/2010) by Darina Boehringer, MD (Neurosurgery)  [facility] Procedures  Date Name LateralityProvider Name and Address 12/03/2022 G-RDR SNRB                                             Charlie Dolores, MD 01/17/2022 G-RDR SNRB 2 Level                                          Charlie Dolores, MD 07/03/2021 G-RDR SNRB 2 Level                               Manuelita Shoulder 05/01/2021 G-JCB Trigger Point                                 Reyes Billing, MD 12/04/2020 G-RDR SNRB 2 Level                      Charlie Dolores, MD 07/18/2020 G-RDR SNRB                                              Charlie Dolores, MD 01/17/2020 shelbie Lumbar TFESIK U1 Level  Charleen Martins, MD 09/25/2018 Lumbar Laminectomy    09/20/2018 LAMINOTOMY (HEMILAMINECTOMY), DECOMPRESSION OF NERVE ROOTS, PARTIAL FACECTOTOMY, FORAMINOTOMY AND/OR DISC REMOVAL, LUMBAR  (SURG) Shanda Lenis 07/30/2016 Lumbar Laminectomy   April Webb 03/07/2011 Lumbar Laminectomy   April Webb 03/06/2001 Lumbar Laminectomy       Follow-up plan:   Return in about 2 weeks (around 09/11/2023) for (Face2F), (PPE).     Recent Visits Date Type Provider Dept  08/12/23 Office Visit Tanya Glisson, MD [facility]  07/22/23 Procedure visit Tanya Glisson, MD [facility]  07/09/23 Office Visit Tanya Glisson, MD [facility]  06/12/23 Procedure visit Tanya Glisson, MD [facility]  Showing recent visits within past 90 days and meeting all other requirements Today's Visits Date Type Provider Dept  08/28/23 Procedure visit Tanya Glisson, MD [facility]  Showing today's visits and meeting all other requirements Future Appointments Date Type Provider Dept  09/11/23 Appointment Tanya Glisson, MD [facility]  Showing future appointments within next 90 days and meeting all other requirements   Disposition: Discharge home  Discharge (Date  Time): 08/28/2023; 0930 hrs.   Primary Care Physician: Gasper Nancyann BRAVO, MD Location: [facility] Note by: Glisson DELENA Tanya, MD (TTS technology used. I apologize for any typographical errors that were not detected and corrected.) Date: 08/28/2023; Time: 11:44 AM  Disclaimer:  Medicine is not an Visual merchandiser. The only guarantee in medicine is that nothing is guaranteed. It is important to note that the decision to proceed with this intervention was based on the information collected from the patient. The Data and conclusions were drawn from the patient's questionnaire, the interview, and the physical examination. Because the information was provided in large part by the patient, it cannot be guaranteed that it has not been purposely or unconsciously manipulated. Every effort has been made to obtain as much relevant data as possible for  this evaluation.  It is important to note that the conclusions that lead to this procedure are derived in large part from the available data. Always take into account that the treatment will also be dependent on availability of resources and existing treatment guidelines, considered by other Pain Management Practitioners as being common knowledge and practice, at the time of the intervention. For Medico-Legal purposes, it is also important to point out that variation in procedural techniques and pharmacological choices are the acceptable norm. The indications, contraindications, technique, and results of the above procedure should only be interpreted and judged by a Board-Certified Interventional Pain Specialist with extensive familiarity and expertise in the same exact procedure and technique.

## 2023-08-28 NOTE — Patient Instructions (Addendum)
Facet Blocks Patient Information  Description: The facets are joints in the spine between the vertebrae.  Like any joints in the body, facets can become irritated and painful.  Arthritis can also effect the facets.  By injecting steroids and local anesthetic in and around these joints, we can temporarily block the nerve supply to them.  Steroids act directly on irritated nerves and tissues to reduce selling and inflammation which often leads to decreased pain.  Facet blocks may be done anywhere along the spine from the neck to the low back depending upon the location of your pain.   After numbing the skin with local anesthetic (like Novocaine), a small needle is passed onto the facet joints under x-ray guidance.  You may experience a sensation of pressure while this is being done.  The entire block usually lasts about 15-25 minutes.   Conditions which may be treated by facet blocks:  Low back/buttock pain Neck/shoulder pain Certain types of headaches  Preparation for the injection:  Do not eat any solid food or dairy products within 8 hours of your appointment. You may drink clear liquid up to 3 hours before appointment.  Clear liquids include water, black coffee, juice or soda.  No milk or cream please. You may take your regular medication, including pain medications, with a sip of water before your appointment.  Diabetics should hold regular insulin (if taken separately) and take 1/2 normal NPH dose the morning of the procedure.  Carry some sugar containing items with you to your appointment. A driver must accompany you and be prepared to drive you home after your procedure. Bring all your current medications with you. An IV may be inserted and sedation may be given at the discretion of the physician. A blood pressure cuff, EKG and other monitors will often be applied during the procedure.  Some patients may need to have extra oxygen administered for a short period. You will be asked to  provide medical information, including your allergies and medications, prior to the procedure.  We must know immediately if you are taking blood thinners (like Coumadin/Warfarin) or if you are allergic to IV iodine contrast (dye).  We must know if you could possible be pregnant.  Possible side-effects:  Bleeding from needle site Infection (rare, may require surgery) Nerve injury (rare) Numbness & tingling (temporary) Difficulty urinating (rare, temporary) Spinal headache (a headache worse with upright posture) Light-headedness (temporary) Pain at injection site (serveral days) Decreased blood pressure (rare, temporary) Weakness in arm/leg (temporary) Pressure sensation in back/neck (temporary)   Call if you experience:  Fever/chills associated with headache or increased back/neck pain Headache worsened by an upright position New onset, weakness or numbness of an extremity below the injection site Hives or difficulty breathing (go to the emergency room) Inflammation or drainage at the injection site(s) Severe back/neck pain greater than usual New symptoms which are concerning to you  Please note:  Although the local anesthetic injected can often make your back or neck feel good for several hours after the injection, the pain will likely return. It takes 3-7 days for steroids to work.  You may not notice any pain relief for at least one week.  If effective, we will often do a series of 2-3 injections spaced 3-6 weeks apart to maximally decrease your pain.  After the initial series, you may be a candidate for a more permanent nerve block of the facets.  If you have any questions, please call #[phone]) [facility]  Pain Clinic ____________________________________________________________________________________________  Post-Procedure Discharge Instructions  Instructions: Apply ice:  Purpose: This will minimize any swelling and discomfort after procedure.   When: Day of procedure, as soon as you get home. How: Fill a plastic sandwich bag with crushed ice. Cover it with a small towel and apply to injection site. How long: (15 min on, 15 min off) Apply for 15 minutes then remove x 15 minutes.  Repeat sequence on day of procedure, until you go to bed. Apply heat:  Purpose: To treat any soreness and discomfort from the procedure. When: Starting the next day after the procedure. How: Apply heat to procedure site starting the day following the procedure. How long: May continue to repeat daily, until discomfort goes away. Food intake: Start with clear liquids (like water) and advance to regular food, as tolerated.  Physical activities: Keep activities to a minimum for the first 8 hours after the procedure. After that, then as tolerated. Driving: If you have received any sedation, be responsible and do not drive. You are not allowed to drive for 24 hours after having sedation. Blood thinner: (Applies only to those taking blood thinners) You may restart your blood thinner 6 hours after your procedure. Insulin: (Applies only to Diabetic patients taking insulin) As soon as you can eat, you may resume your normal dosing schedule. Infection prevention: Keep procedure site clean and dry. Shower daily and clean area with soap and water. Post-procedure Pain Diary: Extremely important that this be done correctly and accurately. Recorded information will be used to determine the next step in treatment. For the purpose of accuracy, follow these rules: Evaluate only the area treated. Do not report or include pain from an untreated area. For the purpose of this evaluation, ignore all other areas of pain, except for the treated area. After your procedure, avoid taking a long nap and attempting to complete the pain diary after you wake up. Instead, set your alarm clock to go off every hour, on the hour, for the initial 8 hours after the procedure. Document the duration of  the numbing medicine, and the relief you are getting from it. Do not go to sleep and attempt to complete it later. It will not be accurate. If you received sedation, it is likely that you were given a medication that may cause amnesia. Because of this, completing the diary at a later time may cause the information to be inaccurate. This information is needed to plan your care. Follow-up appointment: Keep your post-procedure follow-up evaluation appointment after the procedure (usually 2 weeks for most procedures, 6 weeks for radiofrequencies). DO NOT FORGET to bring you pain diary with you.   Expect: (What should I expect to see with my procedure?) From numbing medicine (AKA: Local Anesthetics): Numbness or decrease in pain. You may also experience some weakness, which if present, could last for the duration of the local anesthetic. Onset: Full effect within 15 minutes of injected. Duration: It will depend on the type of local anesthetic used. On the average, 1 to 8 hours.  From steroids (Applies only if steroids were used): Decrease in swelling or inflammation. Once inflammation is improved, relief of the pain will follow. Onset of benefits: Depends on the amount of swelling present. The more swelling, the longer it will take for the benefits to be seen. In some cases, up to 10 days. Duration: Steroids will stay in the system x 2 weeks. Duration of benefits will depend on multiple posibilities including persistent irritating factors. Side-effects:  If present, they may typically last 2 weeks (the duration of the steroids). Frequent: Cramps (if they occur, drink Gatorade and take over-the-counter Magnesium 450-500 mg once to twice a day); water retention with temporary weight gain; increases in blood sugar; decreased immune system response; increased appetite. Occasional: Facial flushing (red, warm cheeks); mood swings; menstrual changes. Uncommon: Long-term decrease or suppression of natural hormones;  bone thinning. (These are more common with higher doses or more frequent use. This is why we prefer that our patients avoid having any injection therapies in other practices.)  Very Rare: Severe mood changes; psychosis; aseptic necrosis. From procedure: Some discomfort is to be expected once the numbing medicine wears off. This should be minimal if ice and heat are applied as instructed.  Call if: (When should I call?) You experience numbness and weakness that gets worse with time, as opposed to wearing off. New onset bowel or bladder incontinence. (Applies only to procedures done in the spine)  Emergency Numbers: Durning business hours (Monday - Thursday, 8:00 AM - 4:00 PM) (Friday, 9:00 AM - 12:00 Noon): [phone] After hours: [phone] NOTE: If you are having a problem and are unable connect with, or to talk to a provider, then go to your nearest urgent care or emergency department. If the problem is serious and urgent, please call [phone]. ____________________________________________________________________________________________

## 2023-08-29 ENCOUNTER — Telehealth: Payer: Self-pay

## 2023-08-29 NOTE — Telephone Encounter (Signed)
Post procedure follow up.  Patient states he is doing well 

## 2023-09-02 ENCOUNTER — Encounter: Payer: Self-pay | Admitting: Dermatology

## 2023-09-02 ENCOUNTER — Ambulatory Visit: Admitting: Dermatology

## 2023-09-02 DIAGNOSIS — D692 Other nonthrombocytopenic purpura: Secondary | ICD-10-CM | POA: Diagnosis not present

## 2023-09-02 DIAGNOSIS — L570 Actinic keratosis: Secondary | ICD-10-CM | POA: Diagnosis not present

## 2023-09-02 DIAGNOSIS — L918 Other hypertrophic disorders of the skin: Secondary | ICD-10-CM | POA: Diagnosis not present

## 2023-09-02 DIAGNOSIS — Z7189 Other specified counseling: Secondary | ICD-10-CM

## 2023-09-02 DIAGNOSIS — Z1283 Encounter for screening for malignant neoplasm of skin: Secondary | ICD-10-CM | POA: Diagnosis not present

## 2023-09-02 DIAGNOSIS — B353 Tinea pedis: Secondary | ICD-10-CM | POA: Diagnosis not present

## 2023-09-02 DIAGNOSIS — D1801 Hemangioma of skin and subcutaneous tissue: Secondary | ICD-10-CM | POA: Diagnosis not present

## 2023-09-02 DIAGNOSIS — B351 Tinea unguium: Secondary | ICD-10-CM

## 2023-09-02 DIAGNOSIS — D229 Melanocytic nevi, unspecified: Secondary | ICD-10-CM

## 2023-09-02 DIAGNOSIS — L821 Other seborrheic keratosis: Secondary | ICD-10-CM

## 2023-09-02 DIAGNOSIS — Z79899 Other long term (current) drug therapy: Secondary | ICD-10-CM

## 2023-09-02 DIAGNOSIS — B352 Tinea manuum: Secondary | ICD-10-CM

## 2023-09-02 DIAGNOSIS — L578 Other skin changes due to chronic exposure to nonionizing radiation: Secondary | ICD-10-CM

## 2023-09-02 DIAGNOSIS — W908XXA Exposure to other nonionizing radiation, initial encounter: Secondary | ICD-10-CM | POA: Diagnosis not present

## 2023-09-02 DIAGNOSIS — L814 Other melanin hyperpigmentation: Secondary | ICD-10-CM | POA: Diagnosis not present

## 2023-09-02 DIAGNOSIS — Z85828 Personal history of other malignant neoplasm of skin: Secondary | ICD-10-CM

## 2023-09-02 DIAGNOSIS — Z8589 Personal history of malignant neoplasm of other organs and systems: Secondary | ICD-10-CM

## 2023-09-02 MED ORDER — TERBINAFINE HCL 250 MG PO TABS: 250.0000 mg | ORAL_TABLET | Freq: Every day | ORAL | 0 refills | Status: DC

## 2023-09-02 NOTE — Progress Notes (Signed)
 Follow-Up Visit   Subjective  Rodney Hall. is a 78 y.o. male who presents for the following: Skin Cancer Screening and Full Body Skin Exam Hx of sccis, hx of bcc, hx of aks, hx of isks  The patient presents for Total-Body Skin Exam (TBSE) for skin cancer screening and mole check. The patient has spots, moles and lesions to be evaluated, some may be new or changing and the patient may have concern these could be cancer.  The following portions of the chart were reviewed this encounter and updated as appropriate: medications, allergies, medical history  Review of Systems:  No other skin or systemic complaints except as noted in HPI or Assessment and Plan.  Objective  Well appearing patient in no apparent distress; mood and affect are within normal limits.  A full examination was performed including scalp, head, eyes, ears, nose, lips, neck, chest, axillae, abdomen, back, buttocks, bilateral upper extremities, bilateral lower extremities, hands, feet, fingers, toes, fingernails, and toenails. All findings within normal limits unless otherwise noted below.   Relevant physical exam findings are noted in the Assessment and Plan.  face and ears x 15 (15) Erythematous thin papules/macules with gritty scale.   Assessment & Plan   SKIN CANCER SCREENING PERFORMED TODAY.  ACTINIC DAMAGE - Chronic condition, secondary to cumulative UV/sun exposure - diffuse scaly erythematous macules with underlying dyspigmentation - Recommend daily broad spectrum sunscreen SPF 30+ to sun-exposed areas, reapply every 2 hours as needed.  - Staying in the shade or wearing long sleeves, sun glasses (UVA+UVB protection) and wide brim hats (4-inch brim around the entire circumference of the hat) are also recommended for sun protection.  - Call for new or changing lesions.  LENTIGINES, SEBORRHEIC KERATOSES, HEMANGIOMAS - Benign normal skin lesions - Benign-appearing - Call for any changes  MELANOCYTIC  NEVI - Tan-brown and/or pink-flesh-colored symmetric macules and papules - Benign appearing on exam today - Observation - Call clinic for new or changing moles - Recommend daily use of broad spectrum spf 30+ sunscreen to sun-exposed areas.   Purpura - Chronic; persistent and recurrent.  Treatable, but not curable. - Violaceous macules and patches - Benign - Related to trauma, age, sun damage and/or use of blood thinners, chronic use of topical and/or oral steroids - Observe - Can use OTC arnica containing moisturizer such as Dermend Bruise Formula if desired - Call for worsening or other concerns  Acrochordons (Skin Tags) - Fleshy, skin-colored pedunculated papules - Benign appearing.  - Observe. - If desired, they can be removed with an in office procedure that is not covered by insurance. 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  - Call if any new or changing lesions are noted between office visits  TINEA PEDIS & Tinea Unguium Exam: Scaling and maceration web spaces and  over distal and lateral soles. Chronic and persistent condition with duration or expected duration over one year. Condition is symptomatic / bothersome to patient. Not to goal. Treatment Plan: March 2025 reviewed LFTs test and OK  Start terbinfine 250  mg po qd for 30 days  Patient advised to contact us  through mychart for additional refills 2  Recheck in 8 months   Terbinafine Counseling Terbinafine is an anti-fungal medicine that can be applied to the skin (over the counter) or taken by mouth (prescription) to treat fungal infections. The pill version is often used to treat fungal infections of the nails or scalp. While most people do not have any side effects from taking terbinafine pills, some possible side effects of the medicine can include taste changes, headache, loss of smell, vision changes, nausea, vomiting, or diarrhea.   Rare side effects can include irritation of the liver, allergic reaction, or decrease in blood counts (which may show up as not feeling well or developing an infection). If you are concerned about any of these side effects, please stop the medicine and call your doctor, or in the case of an emergency such as feeling very unwell, seek immediate medical care.   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 They are chronic with expected duration over 1 year. A portion of actinic keratoses will progress to squamous cell carcinoma of the skin. It is not possible to reliably predict which spots will progress to skin cancer and so treatment is recommended to prevent development of skin cancer.  Recommend daily broad spectrum sunscreen SPF 30+ to sun-exposed areas, reapply every 2 hours as needed.  Recommend staying in the shade or wearing long sleeves, sun glasses (UVA+UVB protection) and wide brim hats (4-inch brim around the entire circumference of the hat). Call for new or changing lesions. Destruction of lesion - face and ears x 15 (15) Complexity: simple   Destruction method: cryotherapy   Informed consent: discussed and consent obtained   Timeout:  patient name, date of birth, surgical site,  and procedure verified Lesion destroyed using liquid nitrogen: Yes   Region frozen until ice ball extended beyond lesion: Yes   Outcome: patient tolerated procedure well with no complications   Post-procedure details: wound care instructions given   TINEA MANUUM, PEDIS, AND UNGUIUM   Related Medications terbinafine (LAMISIL) 250 MG tablet Take 1 tablet (250 mg total) by mouth daily. Return in about 1 year (around 09/01/2024) for TBSE.  IEleanor Blush, CMA, am acting as scribe for Alm Rhyme, MD.   Documentation: I have reviewed the above documentation for accuracy and completeness, and I agree with the above.  Alm Rhyme, MD

## 2023-09-02 NOTE — Patient Instructions (Addendum)
 For toes and feet  Start terbinafine 250 mg tab take 1 by mouth daily for one month. If no side effects send mychart message letting us  know you are ok and would like refills will then send 2 additional refills of medication  Terbinafine Counseling  Terbinafine is an anti-fungal medicine that can be applied to the skin (over the counter) or taken by mouth (prescription) to treat fungal infections. The pill version is often used to treat fungal infections of the nails or scalp. While most people do not have any side effects from taking terbinafine pills, some possible side effects of the medicine can include taste changes, headache, loss of smell, vision changes, nausea, vomiting, or diarrhea.   Rare side effects can include irritation of the liver, allergic reaction, or decrease in blood counts (which may show up as not feeling well or developing an infection). If you are concerned about any of these side effects, please stop the medicine and call your doctor, or in the case of an emergency such as feeling very unwell, seek immediate medical care.       Actinic keratoses are precancerous spots that appear secondary to cumulative UV radiation exposure/sun exposure over time. They are chronic with expected duration over 1 year. A portion of actinic keratoses will progress to squamous cell carcinoma of the skin. It is not possible to reliably predict which spots will progress to skin cancer and so treatment is recommended to prevent development of skin cancer.  Recommend daily broad spectrum sunscreen SPF 30+ to sun-exposed areas, reapply every 2 hours as needed.  Recommend staying in the shade or wearing long sleeves, sun glasses (UVA+UVB protection) and wide brim hats (4-inch brim around the entire circumference of the hat). Call for new or changing lesions.    Cryotherapy Aftercare  Wash gently with soap and water  everyday.   Apply Vaseline and Band-Aid daily until healed.     Melanoma  ABCDEs  Melanoma is the most dangerous type of skin cancer, and is the leading cause of death from skin disease.  You are more likely to develop melanoma if you: Have light-colored skin, light-colored eyes, or red or blond hair Spend a lot of time in the sun Tan regularly, either outdoors or in a tanning bed Have had blistering sunburns, especially during childhood Have a close family member who has had a melanoma Have atypical moles or large birthmarks  Early detection of melanoma is key since treatment is typically straightforward and cure rates are extremely high if we catch it early.   The first sign of melanoma is often a change in a mole or a new dark spot.  The ABCDE system is a way of remembering the signs of melanoma.  A for asymmetry:  The two halves do not match. B for border:  The edges of the growth are irregular. C for color:  A mixture of colors are present instead of an even brown color. D for diameter:  Melanomas are usually (but not always) greater than 6mm - the size of a pencil eraser. E for evolution:  The spot keeps changing in size, shape, and color.  Please check your skin once per month between visits. You can use a small mirror in front and a large mirror behind you to keep an eye on the back side or your body.   If you see any new or changing lesions before your next follow-up, please call to schedule a visit.  Please continue daily skin protection  including broad spectrum sunscreen SPF 30+ to sun-exposed areas, reapplying every 2 hours as needed when you're outdoors.   Staying in the shade or wearing long sleeves, sun glasses (UVA+UVB protection) and wide brim hats (4-inch brim around the entire circumference of the hat) are also recommended for sun protection.    Due to recent changes in healthcare laws, you may see results of your pathology and/or laboratory studies on MyChart before the doctors have had a chance to review them. We understand that in some  cases there may be results that are confusing or concerning to you. Please understand that not all results are received at the same time and often the doctors may need to interpret multiple results in order to provide you with the best plan of care or course of treatment. Therefore, we ask that you please give us  2 business days to thoroughly review all your results before contacting the office for clarification. Should we see a critical lab result, you will be contacted sooner.   If You Need Anything After Your Visit  If you have any questions or concerns for your doctor, please call our main line at [phone] and press option 4 to reach your doctor's medical assistant. If no one answers, please leave a voicemail as directed and we will return your call as soon as possible. Messages left after 4 pm will be answered the following business day.   You may also send us  a message via MyChart. We typically respond to MyChart messages within 1-2 business days.  For prescription refills, please ask your pharmacy to contact our office. Our fax number is [phone].  If you have an urgent issue when the clinic is closed that cannot wait until the next business day, you can page your doctor at the number below.    Please note that while we do our best to be available for urgent issues outside of office hours, we are not available 24/7.   If you have an urgent issue and are unable to reach us , you may choose to seek medical care at your doctor's office, retail clinic, urgent care center, or emergency room.  If you have a medical emergency, please immediately call 911 or go to the emergency department.  Pager Numbers  - Dr. Hester: [phone]  - Dr. Jackquline: [phone]  - Dr. Claudene: [phone]   In the event of inclement weather, please call our main line at [phone] for an update on the status of any delays or closures.  Dermatology Medication Tips: Please keep the boxes that  topical medications come in in order to help keep track of the instructions about where and how to use these. Pharmacies typically print the medication instructions only on the boxes and not directly on the medication tubes.   If your medication is too expensive, please contact our office at [phone] option 4 or send us  a message through MyChart.   We are unable to tell what your co-pay for medications will be in advance as this is different depending on your insurance coverage. However, we may be able to find a substitute medication at lower cost or fill out paperwork to get insurance to cover a needed medication.   If a prior authorization is required to get your medication covered by your insurance company, please allow us  1-2 business days to complete this process.  Drug prices often vary depending on where the prescription is filled and some pharmacies may offer cheaper prices.  The website www.goodrx.com contains  coupons for medications through different pharmacies. The prices here do not account for what the cost may be with help from insurance (it may be cheaper with your insurance), but the website can give you the price if you did not use any insurance.  - You can print the associated coupon and take it with your prescription to the pharmacy.  - You may also stop by our office during regular business hours and pick up a GoodRx coupon card.  - If you need your prescription sent electronically to a different pharmacy, notify our office through [facility] or by phone at [phone] option 4.     Si Usted Necesita Algo Despus de Su Visita  Tambin puede enviarnos un mensaje a travs de Clinical cytogeneticist. Por lo general respondemos a los mensajes de MyChart en el transcurso de 1 a 2 das hbiles.  Para renovar recetas, por favor pida a su farmacia que se ponga en contacto con nuestra oficina. [place]i [place] de fax es [place] [phone].  Si tiene un asunto urgente cuando la clnica  est cerrada y que no puede esperar hasta el siguiente da hbil, puede llamar/localizar a su doctor(a) al nmero que aparece a continuacin.   Por favor, tenga en cuenta que aunque hacemos todo lo posible para estar disponibles para asuntos urgentes fuera del horario de [place], no estamos disponibles las 24 horas del da, los 7 [address]  [address] de la [place].   Si tiene un problema urgente y no puede comunicarse con nosotros, puede optar por buscar atencin mdica  en el consultorio de su doctor(a), en una clnica privada, en un centro de atencin urgente o en una sala de emergencias.  Si tiene Engineer, drilling, por favor llame inmediatamente al 911 o vaya a la sala de emergencias.  Nmeros de bper  - Dr. Hester: [phone]  - Dra. Jackquline: [phone]  - Dr. Claudene: [phone]   En caso de inclemencias del tiempo, por favor llame a landry capes principal al [phone] para una actualizacin sobre el Black de cualquier retraso o cierre.  Consejos para la medicacin en dermatologa: Por favor, guarde las cajas en las que vienen los medicamentos de uso tpico para ayudarle a seguir las instrucciones sobre dnde y cmo usarlos. Las farmacias generalmente imprimen las instrucciones del medicamento slo en las cajas y no directamente en los tubos del [place].   Si su medicamento es muy caro, por favor, pngase en contacto con landry rieger llamando al [phone] y presione la opcin 4 o envenos un mensaje a travs de Clinical cytogeneticist.   No podemos decirle cul ser su copago por los medicamentos por adelantado ya que esto es diferente dependiendo de la cobertura de su seguro. Sin embargo, es posible que podamos encontrar un medicamento sustituto a Audiological scientist un formulario para que el seguro cubra el medicamento que se considera necesario.   Si se requiere una autorizacin previa para que su compaa de seguros malta su medicamento, por favor permtanos de 1 a 2 das hbiles para  completar este proceso.  Los precios de los medicamentos varan con frecuencia dependiendo del Environmental consultant de dnde se surte la receta y alguna farmacias pueden ofrecer precios ms baratos.  El sitio web www.goodrx.com tiene cupones para medicamentos de Health and safety inspector. Los precios aqu no tienen en cuenta lo que podra costar con la ayuda del seguro (puede ser ms barato con su seguro), pero el sitio web puede darle el precio si no utiliz Tourist information centre manager.  - Puede imprimir el  cupn correspondiente y llevarlo con su receta a la farmacia.  - Tambin puede pasar por nuestra oficina durante el horario de atencin regular y Education officer, museum una tarjeta de cupones de GoodRx.  - Si necesita que su receta se enve electrnicamente a una farmacia diferente, informe a nuestra oficina a travs de MyChart de [place] o por telfono llamando al [phone] y presione la opcin 4.

## 2023-09-10 NOTE — Progress Notes (Signed)
 PROVIDER NOTE: Interpretation of information contained herein should be left to medically-trained personnel. Specific patient instructions are provided elsewhere under Patient Instructions section of medical record. This document was created in part using AI and STT-dictation technology, any transcriptional errors that may result from this process are unintentional.  Patient: Rodney Hall.  Service: E/M   PCP: Gasper Nancyann BRAVO, MD  DOB: [birth year]  DOS: 09/11/2023  Provider: Eric DELENA Como, MD  MRN: [id number]  Delivery: Face-to-face  Specialty: Interventional Pain Management  Type: Established Patient  Setting: Ambulatory outpatient facility  Specialty designation: 09  Referring Prov.: Gasper Nancyann BRAVO, MD  Location: Outpatient office facility       History of present illness (HPI) Rodney Hall., a 78 y.o. year old male, is here today because of his Chronic bilateral low back pain without sciatica [M54.50, G89.29]. Rodney Hall primary complain today is No chief complaint on file.  Pertinent problems: Rodney Hall has Impingement syndrome of shoulder region; History of spinal fusion; Lumbar post-laminectomy syndrome; Lumbosacral spondylosis with radiculopathy; Abnormal CT scan, lumbar spine (06/17/2016); Failed back surgical syndrome; Chronic low back pain (1ry area of Pain) (Bilateral) w/o sciatica; Chronic lower extremity pain (Left); Epidural fibrosis; Lumbosacral radiculopathy at S1 (Left); Lumbosacral radiculopathy at L5 (Left); Lumbar facet hypertrophy (Multilevel) (Bilateral); Abnormal MRI, lumbar spine (02/04/2023) [facility]); Lumbar facet joint pain; Spondylosis without myelopathy or radiculopathy, lumbosacral region; Lumbar facet joint syndrome; Low back pain of over 3 months duration; Mechanical low back pain; Multifactorial low back pain; Intractable low back pain; and Discogenic low back pain on their pertinent problem list.  Pain Assessment: Severity  of   is reported as a  /10. Location:    / . Onset:  . Quality:  . Timing:  . Modifying factor(s):  [name] Vitals:  vitals were not taken for this visit.  BMI: Estimated body mass index is 26.54 kg/m as calculated from the following:   Height as of 08/28/23: 5' 10 (1.778 m).   Weight as of 08/28/23: 185 lb (83.9 kg).  Last encounter: 08/12/2023. Last procedure: 08/28/2023.  Reason for encounter: post-procedure evaluation and assessment.   Discussed the use of AI scribe software for clinical note transcription with the patient, who gave verbal consent to proceed.  History of Present Illness          Post-Procedure Evaluation   Type: Lumbar Facet, Medial Branch Block(s) (w/ fluoroscopic mapping) #2  Laterality: Bilateral  Level: L3, L4, L5, and S1 Medial Branch/Dorsal Rami Level(s). Injecting these levels blocks the L4-5 and L5-S1 lumbar facet joints. Imaging: Fluoroscopic guidance Spinal ([id number]) Anesthesia: Local anesthesia (1-2% Lidocaine ) Anxiolysis: None                 Sedation: No Sedation                       DOS: 08/28/2023 Performed by: Eric DELENA Como, MD  Primary Purpose: Diagnostic/Therapeutic Indications: Low back pain severe enough to impact quality of life or function. 1. 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 History of spinal fusion   3. Low back pain of over 3 months duration   4. Lumbar facet joint pain   5. Lumbar facet hypertrophy (Multilevel) (Bilateral)   6. Lumbar facet joint syndrome   7. Spondylosis without myelopathy or radiculopathy, lumbosacral region    NAS-11 Pain score:   Pre-procedure: 2 /10   Post-procedure: 2 /  10     Effectiveness:  Initial hour after procedure:   ***. Subsequent 4-6 hours post-procedure:   ***. Analgesia past initial 6 hours:   ***. Ongoing improvement:  Analgesic:  *** Function:    ***    ROM:    ***     Pharmacotherapy Assessment   Opioid Analgesic(s): No chronic opioid  analgesics therapy prescribed by our practice. None. MME/day: 0 mg/day   Monitoring: [place] PMP: [name]       Pharmacotherapy: No side-effects or adverse reactions reported. Compliance: No problems identified. Effectiveness: Clinically acceptable.  No notes on file  UDS:  No results found for: SUMMARY  No results found for: CBDTHCR No results found for: D8THCCBX No results found for: D9THCCBX  ROS  Constitutional: Denies any fever or chills Gastrointestinal: No reported hemesis, hematochezia, vomiting, or acute GI distress Musculoskeletal: Denies any acute onset joint swelling, redness, loss of ROM, or weakness Neurological: No reported episodes of acute onset apraxia, aphasia, dysarthria, agnosia, amnesia, paralysis, loss of coordination, or loss of consciousness  Medication Review  acetaminophen , aspirin  EC, atorvastatin , busPIRone , clopidogrel , imipramine , omeprazole , oxyCODONE -acetaminophen , tamsulosin , and terbinafine   History Review  Allergy: [name] is allergic to pravastatin  and amoxicillin. Drug: Rodney Hall  reports no history of drug use. Alcohol:  reports no history of alcohol use. Tobacco:  reports that he has never smoked. He has never been exposed to tobacco smoke. He has never used smokeless tobacco. Social: Rodney Hall  reports that he has never smoked. He has never been exposed to tobacco smoke. He has never used smokeless tobacco. He reports that he does not drink alcohol and does not use drugs. 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 Surgical: Rodney Hall  has a past surgical history that includes Back surgery (2012); Neck surgery (2000); Cataract extraction, bilateral; Tonsillectomy and adenoidectomy; Anterior lat lumbar fusion (Right, 07/30/2016); EUS (N/A, 07/10/2017); Tennis elbow release/nirschel procedure; Eye surgery; Cholecystectomy (N/A, 07/25/2017); Colonoscopy with propofol  (N/A, 12/12/2017); Hemi-microdiscectomy lumbar laminectomy level 1 (09/2018); LEFT HEART CATH AND CORONARY ANGIOGRAPHY (N/A, 09/04/2022); Coronary artery bypass graft (N/A, 09/07/2022); TEE without cardioversion (N/A, 09/07/2022); LEFT HEART CATH AND CORONARY ANGIOGRAPHY (N/A, 09/06/2022); IABP Insertion (N/A, 09/06/2022); Colonoscopy with propofol  (N/A, 06/27/2023); Polypectomy (06/27/2023); and Spine surgery (4 times). Family: family history includes Cancer in his mother; Diabetes in his brother and father; Heart disease in his sister; Rheumatic fever in his sister.  Laboratory Chemistry Profile   Renal Lab Results  Component Value Date   BUN 20 05/29/2023   CREATININE 1.82 (H) 05/29/2023   BCR 9 (L) 04/01/2023   GFRAA 58 (L) 01/13/2019   GFRNONAA 38 (L) 05/29/2023    Hepatic Lab Results  Component Value Date   AST 19 05/29/2023   ALT 14 05/29/2023   ALBUMIN  4.4 05/29/2023   ALKPHOS 61 05/29/2023   HCVAB <0.1 06/02/2017   LIPASE 39 05/29/2023    Electrolytes Lab Results  Component Value Date   NA 135 05/29/2023   K 4.7 05/29/2023   CL 102 05/29/2023   CALCIUM  9.2 05/29/2023   MG 2.3 09/08/2022   PHOS 3.5 02/22/2022    Bone No results found for: VD25OH, VD125OH2TOT, CI6874NY7,  CI7874NY7, 25OHVITD1, 25OHVITD2, 25OHVITD3, TESTOFREE, TESTOSTERONE  Inflammation (CRP: Acute Phase) (ESR: Chronic Phase) No results found for: CRP, ESRSEDRATE, LATICACIDVEN       Note: Above Lab results reviewed.  Recent Imaging Review  DG PAIN CLINIC C-ARM 1-60 MIN  NO REPORT Fluoro was used, but no Radiologist interpretation will be provided.  Please refer to NOTES tab for provider progress note. Note: Reviewed        Physical Exam  Vitals: There were no vitals taken for this visit. BMI: Estimated body mass index is 26.54 kg/m as calculated from the following:   Height as of 08/28/23: 5' 10 (1.778 m).   Weight as of 08/28/23: 185 lb (83.9 kg). Ideal: Ideal body weight: 73 kg (160 lb 15 oz) Adjusted ideal body weight: 77.4 kg (170 lb 9 oz) General appearance: Well nourished, well developed, and well hydrated. In no apparent acute distress Mental status: Alert, oriented x 3 (person, place, & time)       Respiratory: No evidence of acute respiratory distress Eyes: PERLA   Assessment   Diagnosis Status  1. 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 Low back pain of over 3 months duration   3. Lumbar facet joint pain   4. Lumbar facet joint syndrome   5. Postop check    Controlled Controlled Controlled   Updated Problems: No problems updated.  Plan of Care  Problem-specific:  Assessment and Plan            Mr. Jubal Rademaker. has a current medication list which includes the following long-term medication(s): atorvastatin  and imipramine .  Pharmacotherapy (Medications Ordered): No orders of the defined types were placed in this encounter.  Orders:  No orders of the defined types were placed in this encounter.    Interventional Therapies  Risk Factors  Considerations  Medical Comorbidities:  Plavix  Anticoagulation: (Stop: 7-10 days  Restart: 2 hours)  CAD  Hx. NSTEMI  S/P CABG x5  Stage 3b CKD  T2NIDDM     Planned  Pending:   Diagnostic bilateral lumbar facet MBB #1    Under consideration:   Diagnostic bilateral lumbar facet MBB #1  Possible Racz procedure #2    Completed:   Therapeutic left Racz procedure there #1 (06/12/2023) (100/100/80/80) Diagnostic/therapeutic left caudal  ESI + diagnostic epidurogram x1 (05/08/2023) (100/100/50/50)   Therapeutic  Palliative (PRN) options:   None established   Completed by other providers:   Surgery: Right L2-3 lateral transpsoas interbody fusion w/ lateral plating and L2-3 decompression (07/30/2016) by Morene DOROTHA Stammer, MD (Neurosurgery)  Surgery: Left L3-4 and L4-5 anterolateral fusion with interbody spacer and right L3-4 and L4-5 pedicle screws (03/13/2010) by Darina Boehringer, MD (Neurosurgery)  [facility] Procedures Date Name LateralityProvider Name and Address 12/03/2022 G-RDR SNRB                                             Charlie Dolores, MD 01/17/2022 G-RDR SNRB 2 Level                                          Charlie Dolores, MD 07/03/2021 G-RDR SNRB 2 Level  Manuelita Shoulder 05/01/2021 G-JCB Trigger Point                                 Reyes Billing, MD 12/04/2020 G-RDR SNRB 2 Level                      Charlie Dolores, MD 07/18/2020 G-RDR SNRB                                              Charlie Dolores, MD 01/17/2020 shelbie Lumbar TFESIK U1 Level  Charleen Martins, MD 09/25/2018 Lumbar Laminectomy    09/20/2018 LAMINOTOMY (HEMILAMINECTOMY), DECOMPRESSION OF NERVE ROOTS, PARTIAL FACECTOTOMY, FORAMINOTOMY AND/OR DISC REMOVAL, LUMBAR (SURG) Shanda Lenis 07/30/2016 Lumbar Laminectomy   April Webb 03/07/2011 Lumbar Laminectomy   April Webb 03/06/2001 Lumbar Laminectomy      No follow-ups on file.    Recent Visits Date Type Provider Dept  08/28/23 Procedure visit Tanya Glisson, MD [facility]  08/12/23 Office Visit Tanya Glisson, MD [facility]  07/22/23 Procedure visit Tanya Glisson, MD [facility]  07/09/23 Office Visit Tanya Glisson, MD [facility]  06/12/23 Procedure visit Tanya Glisson, MD [facility]  Showing recent visits within past 90 days and meeting all other requirements Future Appointments Date Type  Provider Dept  09/11/23 Appointment Tanya Glisson, MD [facility]  Showing future appointments within next 90 days and meeting all other requirements  I discussed the assessment and treatment plan with the patient. The patient was provided an opportunity to ask questions and all were answered. The patient agreed with the plan and demonstrated an understanding of the instructions.  Patient advised to call back or seek an in-person evaluation if the symptoms or condition worsens.  Duration of encounter: *** minutes.  Total time on encounter, as per AMA guidelines included both the face-to-face and non-face-to-face time personally spent by the physician and/or other qualified health care professional(s) on the day of the encounter (includes time in activities that require the physician or other qualified health care professional and does not include time in activities normally performed by clinical staff). Physician's time may include the following activities when performed: Preparing to see the patient (e.g., pre-charting review of records, searching for previously ordered imaging, lab work, and nerve conduction tests) Review of prior analgesic pharmacotherapies. Reviewing PMP Interpreting ordered tests (e.g., lab work, imaging, nerve conduction tests) Performing post-procedure evaluations, including interpretation of diagnostic procedures Obtaining and/or reviewing separately obtained history Performing a medically appropriate examination and/or evaluation Counseling and educating the patient/family/caregiver Ordering medications, tests, or procedures Referring and communicating with other health care professionals (when not separately reported) Documenting clinical information in the electronic or other health record Independently interpreting results (not separately reported) and communicating results to the patient/ family/caregiver Care coordination (not separately  reported)  Note by: Glisson DELENA Tanya, MD (TTS and AI technology used. I apologize for any typographical errors that were not detected and corrected.) Date: 09/11/2023; Time: 3:01 PM

## 2023-09-11 ENCOUNTER — Encounter: Payer: Self-pay | Admitting: Pain Medicine

## 2023-09-11 ENCOUNTER — Ambulatory Visit: Attending: Pain Medicine | Admitting: Pain Medicine

## 2023-09-11 VITALS — BP 119/70 | HR 89 | Temp 97.7°F | Resp 16 | Ht 70.0 in | Wt 182.0 lb

## 2023-09-11 DIAGNOSIS — M5459 Other low back pain: Secondary | ICD-10-CM | POA: Diagnosis present

## 2023-09-11 DIAGNOSIS — M47816 Spondylosis without myelopathy or radiculopathy, lumbar region: Secondary | ICD-10-CM | POA: Diagnosis present

## 2023-09-11 DIAGNOSIS — M545 Low back pain, unspecified: Secondary | ICD-10-CM | POA: Diagnosis present

## 2023-09-11 DIAGNOSIS — Z09 Encounter for follow-up examination after completed treatment for conditions other than malignant neoplasm: Secondary | ICD-10-CM | POA: Diagnosis present

## 2023-09-11 DIAGNOSIS — G8929 Other chronic pain: Secondary | ICD-10-CM | POA: Diagnosis present

## 2023-09-18 ENCOUNTER — Encounter: Payer: Self-pay | Admitting: Urology

## 2023-09-25 DIAGNOSIS — D509 Iron deficiency anemia, unspecified: Secondary | ICD-10-CM | POA: Diagnosis not present

## 2023-09-25 DIAGNOSIS — Z7901 Long term (current) use of anticoagulants: Secondary | ICD-10-CM | POA: Diagnosis not present

## 2023-09-25 DIAGNOSIS — D369 Benign neoplasm, unspecified site: Secondary | ICD-10-CM | POA: Diagnosis not present

## 2023-10-06 ENCOUNTER — Other Ambulatory Visit: Payer: Self-pay | Admitting: Dermatology

## 2023-10-06 DIAGNOSIS — B351 Tinea unguium: Secondary | ICD-10-CM

## 2023-10-07 ENCOUNTER — Telehealth: Payer: Self-pay

## 2023-10-07 NOTE — Telephone Encounter (Signed)
 Called patient to discuss Terbinafine  tablet  pt took 1st month of oral Terbinafine  (oral Lamisil )) with no side effects, patient would like 2 more months of Terbinafine  tablets.  Sent Terbinafine  250 mg # 30 1 RF to [facility] [address] [place].

## 2023-10-09 ENCOUNTER — Telehealth: Payer: Self-pay | Admitting: *Deleted

## 2023-10-09 NOTE — Telephone Encounter (Signed)
 Returned call to patient and notified he is not technically a patient until seen in the office therefore provider not able to order any test. Patient verbalized understanding.

## 2023-10-20 ENCOUNTER — Ambulatory Visit (INDEPENDENT_AMBULATORY_CARE_PROVIDER_SITE_OTHER): Admitting: Family Medicine

## 2023-10-20 ENCOUNTER — Encounter: Payer: Self-pay | Admitting: Family Medicine

## 2023-10-20 VITALS — BP 104/80 | HR 99 | Resp 14 | Ht 70.0 in | Wt 198.7 lb

## 2023-10-20 DIAGNOSIS — R7303 Prediabetes: Secondary | ICD-10-CM | POA: Diagnosis not present

## 2023-10-20 DIAGNOSIS — Z860101 Personal history of adenomatous and serrated colon polyps: Secondary | ICD-10-CM

## 2023-10-20 DIAGNOSIS — R0602 Shortness of breath: Secondary | ICD-10-CM | POA: Diagnosis not present

## 2023-10-20 DIAGNOSIS — E611 Iron deficiency: Secondary | ICD-10-CM

## 2023-10-20 DIAGNOSIS — G2581 Restless legs syndrome: Secondary | ICD-10-CM

## 2023-10-20 NOTE — Progress Notes (Signed)
 Established patient visit   Patient: Rodney Hall.   DOB: 27-Jun-1945   78 y.o. Male  MRN: [id number] Visit Date: 10/20/2023  Today's healthcare provider: Nancyann Perry, MD   Chief Complaint  Patient presents with   Follow-up    No eye exam scheduled   Shortness of Breath    See pulmonary next week. Not improving    Anemia    Has been taking iron medicine and has felt much better   Subjective    Shortness of Breath Pertinent negatives include no abdominal pain, chest pain, fever, vomiting or wheezing.  Anemia There has been no abdominal pain, fever or palpitations.    Follow up prediabetes, RLS, iron deficiency anemia and shortness of breath. ? Bronchiectasis on chest xray. Scheduled to see pulmonologist. Nervousness and RL symptoms resolved since starting iron supplements in May. Stopped buspirone . No GI side effects from iron which he is taking ever day. Had multiple adenomatous polyps excised from Colon Dr. Maryruth and is schedueld for EGD.   Medications: Outpatient Medications Prior to Visit  Medication Sig   acetaminophen  (TYLENOL ) 325 MG tablet Take 650 mg by mouth every 6 (six) hours as needed for moderate pain or headache.   aspirin  EC 81 MG tablet Take 1 tablet (81 mg total) by mouth daily. Swallow whole.   atorvastatin  (LIPITOR) 10 MG tablet Take 1 tablet (10 mg total) by mouth daily.   clopidogrel  (PLAVIX ) 75 MG tablet Take 1 tablet (75 mg total) by mouth daily.   ferrous sulfate 324 MG TBEC Take 324 mg by mouth daily.   imipramine  (TOFRANIL ) 50 MG tablet Take 1 tablet (50 mg total) by mouth at bedtime.   omeprazole  (PRILOSEC) 20 MG capsule Take 20 mg by mouth 2 (two) times daily.   tamsulosin  (FLOMAX ) 0.4 MG CAPS capsule Take 1 capsule (0.4 mg total) by mouth daily after supper.   terbinafine  (LAMISIL ) 250 MG tablet Take 1 tablet by mouth once daily   No facility-administered medications prior to visit.    Review of Systems  Constitutional:  Negative  for appetite change, chills and fever.  Respiratory:  Positive for shortness of breath. Negative for chest tightness and wheezing.   Cardiovascular:  Negative for chest pain and palpitations.  Gastrointestinal:  Negative for abdominal pain, nausea and vomiting.       Objective    BP 104/80 (BP Location: Left Arm, Patient Position: Sitting, Cuff Size: Normal)   Pulse 99   Resp 14   Ht 5' 10 (1.778 m)   Wt 198 lb 11.2 oz (90.1 kg)   SpO2 99%   BMI 28.51 kg/m    Physical Exam    General appearance: Well developed, well nourished male, cooperative and in no acute distress Head: Normocephalic, without obvious abnormality, atraumatic Respiratory: Respirations even and unlabored, normal respiratory rate Extremities: All extremities are intact.  Skin: Skin color, texture, turgor normal. No rashes seen  Psych: Appropriate mood and affect. Neurologic: Mental status: Alert, oriented to person, place, and time, thought content appropriate.   Assessment & Plan     1. Iron deficiency (Primary) On daily ferous sulfate and tolerating with side effects.  - Iron, TIBC and Ferritin Panel  2. Restless leg syndrome Symptoms resolved since starting iron supplements in May. Is also off buspirone , no longer having nervousness.   3. Prediabetes  - Hemoglobin A1c  4. Shortness of breath ?  Bronchiectasis on chest XR. Is scheduled to see pulmonology.  Return in about 6 months (around 04/21/2024).         Nancyann Perry, MD  [facility] [phone] (phone) [phone] (fax)  [facility]

## 2023-10-20 NOTE — Patient Instructions (Signed)
 Rodney Hall  Please review the attached list of medications and notify my office if there are any errors.   . Please bring all of your medications to every appointment so we can make sure that our medication list is the same as yours.

## 2023-10-21 ENCOUNTER — Ambulatory Visit: Payer: Self-pay | Admitting: Family Medicine

## 2023-10-21 LAB — HEMOGLOBIN A1C
Est. average glucose Bld gHb Est-mCnc: 128 mg/dL
Hgb A1c MFr Bld: 6.1 % — ABNORMAL HIGH (ref 4.8–5.6)

## 2023-10-21 LAB — IRON,TIBC AND FERRITIN PANEL
Ferritin: 39 ng/mL (ref 30–400)
Iron Saturation: 15 % (ref 15–55)
Iron: 52 ug/dL (ref 38–169)
Total Iron Binding Capacity: 356 ug/dL (ref 250–450)
UIBC: 304 ug/dL (ref 111–343)

## 2023-10-24 ENCOUNTER — Telehealth: Payer: Self-pay

## 2023-10-24 NOTE — Telephone Encounter (Signed)
   Name: Rodney Hall.  DOB: [birth year]  MRN: [id number]  Primary Cardiologist: Evalene Lunger, MD   Preoperative team, please contact this patient and set up a phone call appointment for further preoperative risk assessment. Please obtain consent and complete medication review. Thank you for your help. Last seen in April of 2025 by Bernardino Bring, PA  I confirm that guidance regarding antiplatelet and oral anticoagulation therapy has been completed and, if necessary, noted below.  Per office protocol, if patient is without any new symptoms or concerns at the time of their virtual visit, he/she may hold Plavix  for 5 days prior to procedure, and Aspirin  for 7 days. Please resume Asprin and Plavix  as soon as possible postprocedure, at the discretion of the surgeon.    I also confirmed the patient resides in the state of [place] . As per [facility] Medical Board telemedicine laws, the patient must reside in the state in which the provider is licensed.   Lamarr Satterfield, NP 10/24/2023, 3:00 PM [facility]

## 2023-10-24 NOTE — Telephone Encounter (Signed)
 Preop tele appt now scheduled, med rec and consent done.

## 2023-10-24 NOTE — Telephone Encounter (Signed)
  Patient Consent for Virtual Visit        Rodney Hall. has provided verbal consent on 10/24/2023 for a virtual visit (video or telephone).   CONSENT FOR VIRTUAL VISIT FOR:  Rodney Hall.  By participating in this virtual visit I agree to the following:  I hereby voluntarily request, consent and authorize [facility] and its employed or contracted physicians, physician assistants, nurse practitioners or other licensed health care professionals (the Practitioner), to provide me with telemedicine health care services (the "Services) as deemed necessary by the treating Practitioner. I acknowledge and consent to receive the Services by the Practitioner via telemedicine. I understand that the telemedicine visit will involve communicating with the Practitioner through live audiovisual communication technology and the disclosure of certain medical information by electronic transmission. I acknowledge that I have been given the opportunity to request an in-person assessment or other available alternative prior to the telemedicine visit and am voluntarily participating in the telemedicine visit.  I understand that I have the right to withhold or withdraw my consent to the use of telemedicine in the course of my care at any time, without affecting my right to future care or treatment, and that the Practitioner or I may terminate the telemedicine visit at any time. I understand that I have the right to inspect all information obtained and/or recorded in the course of the telemedicine visit and may receive copies of available information for a reasonable fee.  I understand that some of the potential risks of receiving the Services via telemedicine include:  Delay or interruption in medical evaluation due to technological equipment failure or disruption; Information transmitted may not be sufficient (e.g. poor resolution of images) to allow for appropriate medical decision making by the  Practitioner; and/or  In rare instances, security protocols could fail, causing a breach of personal health information.  Furthermore, I acknowledge that it is my responsibility to provide information about my medical history, conditions and care that is complete and accurate to the best of my ability. I acknowledge that Practitioner's advice, recommendations, and/or decision may be based on factors not within their control, such as incomplete or inaccurate data provided by me or distortions of diagnostic images or specimens that may result from electronic transmissions. I understand that the practice of medicine is not an exact science and that Practitioner makes no warranties or guarantees regarding treatment outcomes. I acknowledge that a copy of this consent can be made available to me via my patient portal [facility] MyChart), or I can request a printed copy by calling the office of [facility].    I understand that my insurance will be billed for this visit.   I have read or had this consent read to me. I understand the contents of this consent, which adequately explains the benefits and risks of the Services being provided via telemedicine.  I have been provided ample opportunity to ask questions regarding this consent and the Services and have had my questions answered to my satisfaction. I give my informed consent for the services to be provided through the use of telemedicine in my medical care

## 2023-10-24 NOTE — Telephone Encounter (Signed)
   Pre-operative Risk Assessment    Patient Name: Rodney Hall.  DOB: Mar 08, 1945 MRN: [id number]   Date of last office visit: 06/05/23 Date of next office visit: Not scheduled   Request for Surgical Clearance    Procedure:  Upper Endoscopy  Date of Surgery:  Clearance 11/21/23                                Surgeon:  Dr. Ole Schick Surgeon's Group or Practice Name:  [facility] Phone number:  [phone] Fax number:  [phone]   Type of Clearance Requested:   - Medical  - Pharmacy:  Hold Clopidogrel  (Plavix ) x5 days   Type of Anesthesia:  Not Indicated   Additional requests/questions:    Bonney Ival LOISE Gerome   10/24/2023, 2:41 PM

## 2023-10-29 ENCOUNTER — Ambulatory Visit: Admitting: Pulmonary Disease

## 2023-10-29 ENCOUNTER — Encounter: Payer: Self-pay | Admitting: Pulmonary Disease

## 2023-10-29 VITALS — BP 106/76 | HR 90 | Temp 98.0°F | Ht 70.0 in | Wt 192.0 lb

## 2023-10-29 DIAGNOSIS — R0602 Shortness of breath: Secondary | ICD-10-CM

## 2023-10-29 DIAGNOSIS — J849 Interstitial pulmonary disease, unspecified: Secondary | ICD-10-CM
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
## 2023-10-29 NOTE — Patient Instructions (Signed)
  VISIT SUMMARY: Today, we discussed your ongoing shortness of breath and dizziness. We reviewed your recent lung imaging and your history of coronary artery disease, chronic kidney disease, and gastrointestinal bleeding. We also considered your environmental and occupational exposures.  YOUR PLAN: -SHORTNESS OF BREATH AND SUSPECTED INTERSTITIAL LUNG DISEASE: Your shortness of breath may be due to interstitial lung disease, which involves scarring of the lung tissue. We need to conduct further tests to confirm this. We will order a high-resolution CT scan of your chest and pulmonary function tests to better understand your lung condition. Depending on the results, we will either continue follow-up with your current provider or refer you to a local lung specialist.  INSTRUCTIONS: Please schedule a high-resolution CT scan of your chest and pulmonary function tests as soon as possible. We will review the results in a follow-up appointment in 2-3 months. If you have any questions or experience worsening symptoms, contact our office immediately.

## 2023-11-04 ENCOUNTER — Ambulatory Visit (HOSPITAL_COMMUNITY)
Admission: RE | Admit: 2023-11-04 | Discharge: 2023-11-04 | Disposition: A | Discharge status: Home / Self Care | Source: Ambulatory Visit | Attending: Pulmonary Disease | Admitting: Pulmonary Disease

## 2023-11-04 DIAGNOSIS — J849 Interstitial pulmonary disease, unspecified: Secondary | ICD-10-CM | POA: Insufficient documentation

## 2023-11-04 LAB — PULMONARY FUNCTION TEST
DL/VA % pred: 72 %
DL/VA: 2.83 ml/min/mmHg/L
DLCO unc % pred: 62 %
DLCO unc: 15.55 ml/min/mmHg
FEF 25-75 Post: 2.98 L/s
FEF 25-75 Pre: 4.3 L/s
FEF2575-%Change-Post: -30 %
FEF2575-%Pred-Post: 143 %
FEF2575-%Pred-Pre: 207 %
FEV1-%Change-Post: -8 %
FEV1-%Pred-Post: 101 %
FEV1-%Pred-Pre: 111 %
FEV1-Post: 3 L
FEV1-Pre: 3.27 L
FEV1FVC-%Change-Post: -8 %
FEV1FVC-%Pred-Pre: 120 %
FEV6-%Change-Post: 1 %
FEV6-%Pred-Post: 96 %
FEV6-%Pred-Pre: 95 %
FEV6-Post: 3.73 L
FEV6-Pre: 3.66 L
FEV6FVC-%Pred-Post: 106 %
FEV6FVC-%Pred-Pre: 106 %
FVC-%Change-Post: 0 %
FVC-%Pred-Post: 91 %
FVC-%Pred-Pre: 91 %
FVC-Post: 3.77 L
FVC-Pre: 3.77 L
Post FEV1/FVC ratio: 80 %
Post FEV6/FVC ratio: 100 %
Pre FEV1/FVC ratio: 87 %
Pre FEV6/FVC Ratio: 100 %
RV % pred: 44 %
RV: 1.16 L
TLC % pred: 72 %
TLC: 5.12 L

## 2023-11-04 MED ORDER — ALBUTEROL SULFATE (2.5 MG/3ML) 0.083% IN NEBU
2.5000 mg | INHALATION_SOLUTION | Freq: Once | RESPIRATORY_TRACT | Status: AC
  Administered 2023-11-04: 2.5 mg via RESPIRATORY_TRACT

## 2023-11-07 ENCOUNTER — Ambulatory Visit
Admission: RE | Admit: 2023-11-07 | Discharge: 2023-11-07 | Disposition: A | Discharge status: Home / Self Care | Source: Ambulatory Visit | Attending: Pulmonary Disease | Admitting: Pulmonary Disease

## 2023-11-07 DIAGNOSIS — J849 Interstitial pulmonary disease, unspecified: Secondary | ICD-10-CM | POA: Diagnosis not present

## 2023-11-07 DIAGNOSIS — J479 Bronchiectasis, uncomplicated: Secondary | ICD-10-CM | POA: Diagnosis not present

## 2023-11-12 ENCOUNTER — Ambulatory Visit: Payer: Self-pay | Admitting: Pulmonary Disease

## 2023-11-13 ENCOUNTER — Ambulatory Visit: Attending: Cardiology

## 2023-11-13 DIAGNOSIS — Z01810 Encounter for preprocedural cardiovascular examination: Secondary | ICD-10-CM

## 2023-11-13 NOTE — Progress Notes (Signed)
 Virtual Visit via Telephone Note   Because of Rodney Hall. co-morbid illnesses, he is at least at moderate risk for complications without adequate follow up.  This format is felt to be most appropriate for this patient at this time.  Due to technical limitations with video connection Web designer), today's appointment will be conducted as an audio only telehealth visit, and Rodney Hall. verbally agreed to proceed in this manner.   All issues noted in this document were discussed and addressed.  No physical exam could be performed with this format.  Evaluation Performed:  Preoperative cardiovascular risk assessment _____________   Date:  11/13/2023   Patient ID:  Rodney Hall., DOB [birth year], MRN [id number] Patient Location:  Home Provider location:   Office  Primary Care Provider:  Gasper Nancyann BRAVO, MD Primary Cardiologist:  Evalene Lunger, MD  Chief Complaint / Patient Profile   78 y.o. y/o male with a h/o CAD status post 5 vessel CABG on 09/07/2022, postoperative atrial fibrillation, CKD stage III, HLD, and BPH who is pending upper endoscopy and presents today for telephonic preoperative cardiovascular risk assessment.  History of Present Illness    Rodney Granderson. is a 78 y.o. male who presents via audio/video conferencing for a telehealth visit today.  Pt was last seen in cardiology clinic on 06/05/2023  by Bernardino Bring, PA-C.  At that time Rodney Hall. was doing well.  The patient is now pending procedure as outlined above. Since his last visit, he  has done fine and he had an echo back in Feb and that looked good. No chest pain but he does have SOB. Lungs have some scarring which is the culprit. He was sent to a pulmonary specialist at that time and did PFTs. He had a high res CT scan. He has a lot of back problems and that is the reason he cannot walk far. He goes to the gym 6 days a week and rides the bike. He rides for an hour.   Per office protocol, if  patient is without any new symptoms or concerns at the time of their virtual visit, he may hold Plavix  for 5 days prior to procedure, and Aspirin  for 7 days. Please resume Asprin and Plavix  as soon as possible postprocedure, at the discretion of the surgeon.   Past Medical History    Past Medical History:  Diagnosis Date   Abdominal mass    Abdominal pain 06/02/2017   Anxiety    Pt. denies   Arthritis    pt. denies   Basal cell carcinoma 01/21/2017   left lat forehead   BPH with obstruction/lower urinary tract symptoms    Cholecystitis 06/02/2017   Choledocholithiasis with acute cholecystitis    Chronic kidney disease    Dysrhythmia    GERD (gastroesophageal reflux disease)    History of actinic keratoses    Interstitial cystitis    Myocardial infarction [facility])    Pancreatic cyst    Pneumonia    27 years ago   Squamous cell carcinoma of skin 08/14/2016   left above alar crease sidewall of nose/in situ   Past Surgical History:  Procedure Laterality Date   ANTERIOR LAT LUMBAR FUSION Right 07/30/2016   Procedure: Right Lateral two-three Lateral transpsoas interbody fusion with lateral plating/Minimally invasive decompression at Lumbar two-three;  Surgeon: Ditty, Morene Hicks, MD;  Location: [facility] OR;  Service: Neurosurgery;  Laterality: Right;   BACK SURGERY  2012   CATARACT  EXTRACTION, BILATERAL     CHOLECYSTECTOMY N/A 07/25/2017   Procedure: LAPAROSCOPIC CHOLECYSTECTOMY WITH INTRAOPERATIVE CHOLANGIOGRAM;  Surgeon: Gladis Cough, MD;  Location: [facility] ORS;  Service: General;  Laterality: N/A;   COLONOSCOPY WITH PROPOFOL  N/A 12/12/2017   Procedure: COLONOSCOPY WITH PROPOFOL ;  Surgeon: Viktoria Lamar DASEN, MD;  Location: [facility] ENDOSCOPY;  Service: Endoscopy;  Laterality: N/A;   COLONOSCOPY WITH PROPOFOL  N/A 06/27/2023   Procedure: COLONOSCOPY WITH PROPOFOL ;  Surgeon: Maryruth Ole DASEN, MD;  Location: [facility] ENDOSCOPY;  Service: Endoscopy;  Laterality: N/A;   CORONARY ARTERY BYPASS GRAFT  N/A 09/07/2022   Procedure: CORONARY ARTERY BYPASS GRAFTING (CABG) TIMES FIVE, USING LEFT INTERNAL MAMMARY ARTERY AND RIGHT GREATER SAPHENOUS VEIN HARVESTED ENDOSCOPICALLY;  Surgeon: Lucas Dorise POUR, MD;  Location: [facility] OR;  Service: Open Heart Surgery;  Laterality: N/A;   EUS N/A 07/10/2017   Procedure: UPPER ENDOSCOPIC ULTRASOUND (EUS) LINEAR;  Surgeon: Teressa Toribio SQUIBB, MD;  Location: [facility] ENDOSCOPY;  Service: Endoscopy;  Laterality: N/A;   EYE SURGERY     HEMI-MICRODISCECTOMY LUMBAR LAMINECTOMY LEVEL 1  09/2018   [facility]   IABP INSERTION N/A 09/06/2022   Procedure: IABP Insertion;  Surgeon: Swaziland, Peter M, MD;  Location: [facility] INVASIVE CV LAB;  Service: Cardiovascular;  Laterality: N/A;   LEFT HEART CATH AND CORONARY ANGIOGRAPHY N/A 09/04/2022   Procedure: LEFT HEART CATH AND CORONARY ANGIOGRAPHY;  Surgeon: Mady Bruckner, MD;  Location: [facility] INVASIVE CV LAB;  Service: Cardiovascular;  Laterality: N/A;   LEFT HEART CATH AND CORONARY ANGIOGRAPHY N/A 09/06/2022   Procedure: LEFT HEART CATH AND CORONARY ANGIOGRAPHY;  Surgeon: Swaziland, Peter M, MD;  Location: [facility] INVASIVE CV LAB;  Service: Cardiovascular;  Laterality: N/A;   NECK SURGERY  2000   POLYPECTOMY  06/27/2023   Procedure: POLYPECTOMY, INTESTINE;  Surgeon: Maryruth Ole DASEN, MD;  Location: [facility] ENDOSCOPY;  Service: Endoscopy;;   SPINE SURGERY  4 times   TEE WITHOUT CARDIOVERSION N/A 09/07/2022   Procedure: TRANSESOPHAGEAL ECHOCARDIOGRAM;  Surgeon: Lucas Dorise POUR, MD;  Location: [facility] OR;  Service: Open Heart Surgery;  Laterality: N/A;   TENNIS ELBOW RELEASE/NIRSCHEL PROCEDURE     TONSILLECTOMY AND ADENOIDECTOMY      Allergies  Allergies  Allergen Reactions   Pravastatin      jittery   Amoxicillin Rash    ++ got ceftriaxone  in the past++ Has patient had a PCN reaction causing immediate rash, facial/tongue/throat swelling, SOB or lightheadedness with hypotension: Yes Has patient had a PCN reaction causing severe rash  involving mucus membranes or skin necrosis: No Has patient had a PCN reaction that required hospitalization No Has patient had a PCN reaction occurring within the last 10 years: No If all of the above answers are NO, then may proceed with Cephalosporin use.      Home Medications    Prior to Admission medications   Medication Sig Start Date End Date Taking? Authorizing Provider  acetaminophen  (TYLENOL ) 325 MG tablet Take 650 mg by mouth every 6 (six) hours as needed for moderate pain or headache.    [provider]  aspirin  EC 81 MG tablet Take 1 tablet (81 mg total) by mouth daily. Swallow whole. 09/12/22   Barrett, Erin R, PA-C  atorvastatin  (LIPITOR) 10 MG tablet Take 1 tablet (10 mg total) by mouth daily. 12/30/22 10/29/23  Abigail Bernardino HERO, PA-C  clopidogrel  (PLAVIX ) 75 MG tablet Take 1 tablet (75 mg total) by mouth daily. 12/30/22   Abigail Bernardino HERO, PA-C  ferrous sulfate 324 MG TBEC Take 324  mg by mouth daily.    [provider]  imipramine  (TOFRANIL ) 50 MG tablet Take 1 tablet (50 mg total) by mouth at bedtime. 04/15/23   Francisca Redell BROCKS, MD  omeprazole  (PRILOSEC) 20 MG capsule Take 20 mg by mouth 2 (two) times daily. 03/03/23   [provider]  tamsulosin  (FLOMAX ) 0.4 MG CAPS capsule Take 1 capsule (0.4 mg total) by mouth daily after supper. 11/28/22   Francisca Redell BROCKS, MD  terbinafine  (LAMISIL ) 250 MG tablet Take 1 tablet by mouth once daily 10/06/23   Hester Alm BROCKS, MD    Physical Exam    Vital Signs:  Rodney Hall. does not have vital signs available for review today. Given telephonic nature of communication, physical exam is limited. AAOx3. NAD. Normal affect.  Speech and respirations are unlabored.   Accessory Clinical Findings    None  Assessment & Plan    1.  Preoperative Cardiovascular Risk Assessment:  Rodney Hall perioperative risk of a major cardiac event is 0.9% according to the Revised Cardiac Risk Index (RCRI).  Therefore, he is  at low risk for perioperative complications.   His functional capacity is fair at 4.3 METs according to the Duke Activity Status Index (DASI). Recommendations: According to ACC/AHA guidelines, no further cardiovascular testing needed.  The patient may proceed to surgery at acceptable risk.   Antiplatelet and/or Anticoagulation Recommendations: Aspirin  can be held for 7 days prior to his surgery.  Please resume Aspirin  post operatively when it is felt to be safe from a bleeding standpoint.  Clopidogrel  (Plavix ) can be held for 5 days prior to his surgery and resumed as soon as possible post op.  The patient was advised that if he develops new symptoms prior to surgery to contact our office to arrange for a follow-up visit, and he verbalized understanding.   A copy of this note will be routed to requesting surgeon.  Time:   Today, I have spent 10 minutes with the patient with telehealth technology discussing medical history, symptoms, and management plan.     Rodney LOISE Fabry, PA-C  11/13/2023, 3:15 PM

## 2023-11-21 ENCOUNTER — Ambulatory Visit: Admitting: Anesthesiology

## 2023-11-21 ENCOUNTER — Other Ambulatory Visit: Payer: Self-pay

## 2023-11-21 ENCOUNTER — Ambulatory Visit
Admission: RE | Admit: 2023-11-21 | Discharge: 2023-11-21 | Disposition: A | Discharge status: Home / Self Care | Attending: Gastroenterology | Admitting: Gastroenterology

## 2023-11-21 ENCOUNTER — Encounter
Admission: RE | Disposition: A | Discharge status: Home / Self Care | Payer: Self-pay | Source: Home / Self Care | Attending: Gastroenterology

## 2023-11-21 DIAGNOSIS — Z7902 Long term (current) use of antithrombotics/antiplatelets: Secondary | ICD-10-CM | POA: Diagnosis not present

## 2023-11-21 DIAGNOSIS — I129 Hypertensive chronic kidney disease with stage 1 through stage 4 chronic kidney disease, or unspecified chronic kidney disease: Secondary | ICD-10-CM | POA: Diagnosis not present

## 2023-11-21 DIAGNOSIS — K449 Diaphragmatic hernia without obstruction or gangrene: Secondary | ICD-10-CM | POA: Diagnosis not present

## 2023-11-21 DIAGNOSIS — K2970 Gastritis, unspecified, without bleeding: Secondary | ICD-10-CM | POA: Diagnosis not present

## 2023-11-21 DIAGNOSIS — D509 Iron deficiency anemia, unspecified: Secondary | ICD-10-CM | POA: Diagnosis present

## 2023-11-21 DIAGNOSIS — E1122 Type 2 diabetes mellitus with diabetic chronic kidney disease: Secondary | ICD-10-CM | POA: Insufficient documentation

## 2023-11-21 DIAGNOSIS — I2510 Atherosclerotic heart disease of native coronary artery without angina pectoris: Secondary | ICD-10-CM | POA: Diagnosis not present

## 2023-11-21 DIAGNOSIS — N401 Enlarged prostate with lower urinary tract symptoms: Secondary | ICD-10-CM | POA: Diagnosis not present

## 2023-11-21 DIAGNOSIS — K2100 Gastro-esophageal reflux disease with esophagitis, without bleeding: Secondary | ICD-10-CM | POA: Insufficient documentation

## 2023-11-21 DIAGNOSIS — I252 Old myocardial infarction: Secondary | ICD-10-CM | POA: Diagnosis not present

## 2023-11-21 DIAGNOSIS — K219 Gastro-esophageal reflux disease without esophagitis: Secondary | ICD-10-CM | POA: Diagnosis not present

## 2023-11-21 DIAGNOSIS — N189 Chronic kidney disease, unspecified: Secondary | ICD-10-CM | POA: Insufficient documentation

## 2023-11-21 DIAGNOSIS — N1832 Chronic kidney disease, stage 3b: Secondary | ICD-10-CM | POA: Diagnosis not present

## 2023-11-21 MED ORDER — GLYCOPYRROLATE 0.2 MG/ML IJ SOLN
INTRAMUSCULAR | Status: DC | PRN
  Administered 2023-11-21: .2 mg via INTRAVENOUS

## 2023-11-21 MED ORDER — PROPOFOL 1000 MG/100ML IV EMUL
INTRAVENOUS | Status: AC
  Filled 2023-11-21: qty 100

## 2023-11-21 MED ORDER — DEXMEDETOMIDINE HCL IN NACL 80 MCG/20ML IV SOLN
INTRAVENOUS | Status: DC | PRN
  Administered 2023-11-21: 12 ug via INTRAVENOUS
  Administered 2023-11-21: 8 ug via INTRAVENOUS

## 2023-11-21 MED ORDER — LIDOCAINE HCL (CARDIAC) PF 100 MG/5ML IV SOSY
PREFILLED_SYRINGE | INTRAVENOUS | Status: DC | PRN
  Administered 2023-11-21: 80 mg via INTRAVENOUS

## 2023-11-21 MED ORDER — PROPOFOL 500 MG/50ML IV EMUL
INTRAVENOUS | Status: DC | PRN
  Administered 2023-11-21: 75 ug/kg/min via INTRAVENOUS

## 2023-11-21 MED ORDER — LIDOCAINE HCL (PF) 2 % IJ SOLN
INTRAMUSCULAR | Status: AC
  Filled 2023-11-21: qty 5

## 2023-11-21 MED ORDER — SODIUM CHLORIDE 0.9 % IV SOLN: INTRAVENOUS | Status: DC

## 2023-11-21 MED ORDER — PROPOFOL 10 MG/ML IV BOLUS
INTRAVENOUS | Status: DC | PRN
  Administered 2023-11-21 (×2): 50 mg via INTRAVENOUS

## 2023-11-21 MED ORDER — GLYCOPYRROLATE 0.2 MG/ML IJ SOLN
INTRAMUSCULAR | Status: AC
  Filled 2023-11-21: qty 1

## 2023-11-21 NOTE — Anesthesia Postprocedure Evaluation (Signed)
 Anesthesia Post Note  Patient: Rodney Hall.  Procedure(s) Performed: EGD (ESOPHAGOGASTRODUODENOSCOPY)  Patient location during evaluation: Endoscopy Anesthesia Type: General Level of consciousness: awake and alert Pain management: pain level controlled Vital Signs Assessment: post-procedure vital signs reviewed and stable Respiratory status: spontaneous breathing, nonlabored ventilation and respiratory function stable Cardiovascular status: blood pressure returned to baseline and stable Postop Assessment: no apparent nausea or vomiting Anesthetic complications: no   No notable events documented.   Last Vitals:  Vitals:   11/21/23 1257 11/21/23 1307  BP: 116/78 120/78  Pulse: 78 77  Resp: (!) 22 (!) 23  Temp:    SpO2: 95% 98%    Last Pain:  Vitals:   11/21/23 1307  TempSrc:   PainSc: 0-No pain                 Fairy POUR Avid Guillette

## 2023-11-21 NOTE — Op Note (Signed)
 [facility] Patient Name: Rodney Hall Procedure Date: 11/21/2023 12:34 PM MRN: [id number] Account #: [id number] Date of Birth: 1945/08/27 Admit Type: Outpatient Age: 78 Room: [facility] ENDO ROOM 3 Gender: Male Note Status: Finalized Instrument Name: Upper GI Scope [id number] Procedure:             Upper GI endoscopy Indications:           Iron deficiency anemia, Gastro-esophageal reflux                         disease Providers:             Ole Schick MD, MD Referring MD:          Nancyann BRAVO. Gasper, MD (Referring MD) Medicines:             Monitored Anesthesia Care Complications:         No immediate complications. Estimated blood loss:                         Minimal. Procedure:             Pre-Anesthesia Assessment:                        - Prior to the procedure, a History and Physical was                         performed, and patient medications and allergies were                         reviewed. The patient is competent. The risks and                         benefits of the procedure and the sedation options and                         risks were discussed with the patient. All questions                         were answered and informed consent was obtained.                         Patient identification and proposed procedure were                         verified by the physician, the nurse, the                         anesthesiologist, the anesthetist and the technician                         in the endoscopy suite. Mental Status Examination:                         alert and oriented. Airway Examination: normal                         oropharyngeal airway and neck mobility. Respiratory  Examination: clear to auscultation. CV Examination:                         normal. Prophylactic Antibiotics: The patient does not                         require prophylactic antibiotics. Prior                          Anticoagulants: The patient has taken Plavix                          (clopidogrel ), last dose was 5 days prior to                         procedure. ASA Grade Assessment: III - A patient with                         severe systemic disease. After reviewing the risks and                         benefits, the patient was deemed in satisfactory                         condition to undergo the procedure. The anesthesia                         plan was to use monitored anesthesia care (MAC).                         Immediately prior to administration of medications,                         the patient was re-assessed for adequacy to receive                         sedatives. The heart rate, respiratory rate, oxygen                         saturations, blood pressure, adequacy of pulmonary                         ventilation, and response to care were monitored                         throughout the procedure. The physical status of the                         patient was re-assessed after the procedure.                        After obtaining informed consent, the endoscope was                         passed under direct vision. Throughout the procedure,                         the patient's blood pressure, pulse, and oxygen  saturations were monitored continuously. The Endoscope                         was introduced through the mouth, and advanced to the                         second part of duodenum. The upper GI endoscopy was                         accomplished without difficulty. The patient tolerated                         the procedure well. Findings:      A small hiatal hernia was present.      The exam of the esophagus was otherwise normal.      Patchy mild inflammation characterized by erythema was found in the       gastric antrum. Biopsies were taken with a cold forceps for Helicobacter       pylori testing. Estimated blood loss was minimal.      The  examined duodenum was normal. Impression:            - Small hiatal hernia.                        - Gastritis. Biopsied.                        - Normal examined duodenum. Recommendation:        - Discharge patient to home.                        - Resume previous diet.                        - Resume Plavix  (clopidogrel ) at prior dose today.                        - Await pathology results.                        - Return to referring physician as previously                         scheduled. Procedure Code(s):     --- Professional ---                        [id number], Esophagogastroduodenoscopy, flexible,                         transoral; with biopsy, single or multiple Diagnosis Code(s):     --- Professional ---                        K44.9, Diaphragmatic hernia without obstruction or                         gangrene                        K29.70, Gastritis, unspecified, without bleeding  D50.9, Iron deficiency anemia, unspecified                        K21.9, Gastro-esophageal reflux disease without                         esophagitis CPT copyright 2022 American Medical Association. All rights reserved. The codes documented in this report are preliminary and upon coder review may  be revised to meet current compliance requirements. Ole Schick MD, MD 11/21/2023 12:47:53 PM Number of Addenda: 0 Note Initiated On: 11/21/2023 12:34 PM Estimated Blood Loss:  Estimated blood loss was minimal.      [facility]

## 2023-11-21 NOTE — Anesthesia Preprocedure Evaluation (Signed)
 Anesthesia Evaluation  Patient identified by MRN, date of birth, ID band Patient awake    Reviewed: Allergy & Precautions, NPO status , Patient's Chart, lab work & pertinent test results  History of Anesthesia Complications Negative for: history of anesthetic complications  Airway Mallampati: III  TM Distance: <3 FB Neck ROM: full    Dental  (+) Chipped   Pulmonary neg pulmonary ROS, neg shortness of breath   Pulmonary exam normal        Cardiovascular Exercise Tolerance: Good hypertension, (-) angina + CAD and + Past MI  Normal cardiovascular exam+ dysrhythmias      Neuro/Psych  Neuromuscular disease  negative psych ROS   GI/Hepatic Neg liver ROS,GERD  Controlled,,  Endo/Other  diabetes, Type 2    Renal/GU Renal disease  negative genitourinary   Musculoskeletal   Abdominal   Peds  Hematology negative hematology ROS (+)   Anesthesia Other Findings Past Medical History: No date: Abdominal mass 06/02/2017: Abdominal pain No date: Anxiety     Comment:  Pt. denies No date: Arthritis     Comment:  pt. denies 01/21/2017: Basal cell carcinoma     Comment:  left lat forehead No date: BPH with obstruction/lower urinary tract symptoms 06/02/2017: Cholecystitis No date: Choledocholithiasis with acute cholecystitis No date: Chronic kidney disease No date: Dysrhythmia No date: GERD (gastroesophageal reflux disease) No date: History of actinic keratoses No date: Interstitial cystitis No date: Myocardial infarction (HCC) No date: Pancreatic cyst No date: Pneumonia     Comment:  27 years ago 08/14/2016: Squamous cell carcinoma of skin     Comment:  left above alar crease sidewall of nose/in situ  Past Surgical History: 07/30/2016: ANTERIOR LAT LUMBAR FUSION; Right     Comment:  Procedure: Right Lateral two-three Lateral transpsoas               interbody fusion with lateral plating/Minimally invasive                decompression at Lumbar two-three;  Surgeon: Ditty,               Morene Hicks, MD;  Location: [facility] OR;  Service:               Neurosurgery;  Laterality: Right; 2012: BACK SURGERY No date: CATARACT EXTRACTION, BILATERAL 07/25/2017: CHOLECYSTECTOMY; N/A     Comment:  Procedure: LAPAROSCOPIC CHOLECYSTECTOMY WITH               INTRAOPERATIVE CHOLANGIOGRAM;  Surgeon: Gladis Cough,               MD;  Location: [facility] ORS;  Service: General;  Laterality:               N/A; 12/12/2017: COLONOSCOPY WITH PROPOFOL ; N/A     Comment:  Procedure: COLONOSCOPY WITH PROPOFOL ;  Surgeon: Viktoria Lamar DASEN, MD;  Location: [facility] ENDOSCOPY;  Service:               Endoscopy;  Laterality: N/A; 06/27/2023: COLONOSCOPY WITH PROPOFOL ; N/A     Comment:  Procedure: COLONOSCOPY WITH PROPOFOL ;  Surgeon:               Maryruth Ole DASEN, MD;  Location: [facility] ENDOSCOPY;                Service: Endoscopy;  Laterality: N/A; 09/07/2022: CORONARY ARTERY BYPASS GRAFT; N/A     Comment:  Procedure: CORONARY  ARTERY BYPASS GRAFTING (CABG) TIMES               FIVE, USING LEFT INTERNAL MAMMARY ARTERY AND RIGHT               GREATER SAPHENOUS VEIN HARVESTED ENDOSCOPICALLY;                Surgeon: Lucas Dorise POUR, MD;  Location: [facility] OR;  Service:              Open Heart Surgery;  Laterality: N/A; 07/10/2017: EUS; N/A     Comment:  Procedure: UPPER ENDOSCOPIC ULTRASOUND (EUS) LINEAR;                Surgeon: Teressa Toribio SQUIBB, MD;  Location: [facility] ENDOSCOPY;                Service: Endoscopy;  Laterality: N/A; No date: EYE SURGERY 09/2018: HEMI-MICRODISCECTOMY LUMBAR LAMINECTOMY LEVEL 1     Comment:  [facility] 09/06/2022: IABP INSERTION; N/A     Comment:  Procedure: IABP Insertion;  Surgeon: Swaziland, Peter M,               MD;  Location: [facility] INVASIVE CV LAB;  Service:               Cardiovascular;  Laterality: N/A; 09/04/2022: LEFT HEART CATH AND CORONARY ANGIOGRAPHY; N/A     Comment:  Procedure: LEFT HEART  CATH AND CORONARY ANGIOGRAPHY;                Surgeon: Mady Bruckner, MD;  Location: [facility] INVASIVE               CV LAB;  Service: Cardiovascular;  Laterality: N/A; 09/06/2022: LEFT HEART CATH AND CORONARY ANGIOGRAPHY; N/A     Comment:  Procedure: LEFT HEART CATH AND CORONARY ANGIOGRAPHY;                Surgeon: Swaziland, Peter M, MD;  Location: [facility] INVASIVE CV               LAB;  Service: Cardiovascular;  Laterality: N/A; 2000: NECK SURGERY 06/27/2023: POLYPECTOMY     Comment:  Procedure: POLYPECTOMY, INTESTINE;  Surgeon: Maryruth Ole DASEN, MD;  Location: [facility] ENDOSCOPY;  Service:               Endoscopy;; 4 times: SPINE SURGERY 09/07/2022: TEE WITHOUT CARDIOVERSION; N/A     Comment:  Procedure: TRANSESOPHAGEAL ECHOCARDIOGRAM;  Surgeon:               Lucas Dorise POUR, MD;  Location: [facility] OR;  Service: Open               Heart Surgery;  Laterality: N/A; No date: TENNIS ELBOW RELEASE/NIRSCHEL PROCEDURE No date: TONSILLECTOMY AND ADENOIDECTOMY     Reproductive/Obstetrics negative OB ROS                              Anesthesia Physical Anesthesia Plan  ASA: 3  Anesthesia Plan: General   Post-op Pain Management:    Induction: Intravenous  PONV Risk Score and Plan: Propofol  infusion and TIVA  Airway Management Planned: Natural Airway and Nasal Cannula  Additional Equipment:   Intra-op Plan:   Post-operative Plan:   Informed Consent: I have reviewed the patients History and Physical, chart, labs and discussed the procedure including the risks,  benefits and alternatives for the proposed anesthesia with the patient or authorized representative who has indicated his/her understanding and acceptance.     Dental Advisory Given  Plan Discussed with: Anesthesiologist, CRNA and Surgeon  Anesthesia Plan Comments: (Patient consented for risks of anesthesia including but not limited to:  - adverse reactions to medications - risk of airway  placement if required - damage to eyes, teeth, lips or other oral mucosa - nerve damage due to positioning  - sore throat or hoarseness - Damage to heart, brain, nerves, lungs, other parts of body or loss of life  Patient voiced understanding and assent.)        Anesthesia Quick Evaluation

## 2023-11-21 NOTE — Transfer of Care (Signed)
 Immediate Anesthesia Transfer of Care Note  Patient: Rodney Hall.  Procedure(s) Performed: EGD (ESOPHAGOGASTRODUODENOSCOPY)  Patient Location: PACU  Anesthesia Type:General  Level of Consciousness: sedated  Airway & Oxygen Therapy: Patient Spontanous Breathing  Post-op Assessment: Report given to RN and Post -op Vital signs reviewed and stable  Post vital signs: Reviewed and stable  Last Vitals:  Vitals Value Taken Time  BP 102/82 11/21/23 12:48  Temp 35.6 C 11/21/23 12:47  Pulse 79 11/21/23 12:50  Resp 23 11/21/23 12:50  SpO2 97 % 11/21/23 12:50  Vitals shown include unfiled device data.  Last Pain:  Vitals:   11/21/23 1247  TempSrc: Temporal  PainSc: 0-No pain         Complications: No notable events documented.

## 2023-11-21 NOTE — H&P (Signed)
 Outpatient short stay form Pre-procedure 11/21/2023  Rodney ONEIDA Schick, MD  Primary Physician: Gasper Nancyann BRAVO, MD  Reason for visit:  IDA/GERD  History of present illness:    78 y/o gentleman with history of CAD on plavix  (last dose > 5 days ago), BPH, and CKD here for EGD for IDA/GERD. No family history of GI malignancies. History of neck surgeries.    Current Facility-Administered Medications:    0.9 %  sodium chloride  infusion, , Intravenous, Continuous, Quantia Grullon, Rodney ONEIDA, MD, Last Rate: 20 mL/hr at 11/21/23 1142, New Bag at 11/21/23 1142  Medications Prior to Admission  Medication Sig Dispense Refill Last Dose/Taking   aspirin  EC 81 MG tablet Take 1 tablet (81 mg total) by mouth daily. Swallow whole. 30 tablet 12 Past Week   clopidogrel  (PLAVIX ) 75 MG tablet Take 1 tablet (75 mg total) by mouth daily. 90 tablet 3 Past Week   ferrous sulfate 324 MG TBEC Take 324 mg by mouth daily.   11/20/2023   imipramine  (TOFRANIL ) 50 MG tablet Take 1 tablet (50 mg total) by mouth at bedtime. 90 tablet 3 11/20/2023   omeprazole  (PRILOSEC) 20 MG capsule Take 20 mg by mouth 2 (two) times daily.   11/20/2023   tamsulosin  (FLOMAX ) 0.4 MG CAPS capsule Take 1 capsule (0.4 mg total) by mouth daily after supper. 90 capsule 3 11/20/2023   terbinafine  (LAMISIL ) 250 MG tablet Take 1 tablet by mouth once daily 30 tablet 1 11/20/2023   acetaminophen  (TYLENOL ) 325 MG tablet Take 650 mg by mouth every 6 (six) hours as needed for moderate pain or headache.      atorvastatin  (LIPITOR) 10 MG tablet Take 1 tablet (10 mg total) by mouth daily. 90 tablet 3      Allergies  Allergen Reactions   Pravastatin      jittery   Amoxicillin Rash    ++ got ceftriaxone  in the past++ Has patient had a PCN reaction causing immediate rash, facial/tongue/throat swelling, SOB or lightheadedness with hypotension: Yes Has patient had a PCN reaction causing severe rash involving mucus membranes or skin necrosis: No Has patient had  a PCN reaction that required hospitalization No Has patient had a PCN reaction occurring within the last 10 years: No If all of the above answers are NO, then may proceed with Cephalosporin use.       Past Medical History:  Diagnosis Date   Abdominal mass    Abdominal pain 06/02/2017   Anxiety    Pt. denies   Arthritis    pt. denies   Basal cell carcinoma 01/21/2017   left lat forehead   BPH with obstruction/lower urinary tract symptoms    Cholecystitis 06/02/2017   Choledocholithiasis with acute cholecystitis    Chronic kidney disease    Dysrhythmia    GERD (gastroesophageal reflux disease)    History of actinic keratoses    Interstitial cystitis    Myocardial infarction [facility])    Pancreatic cyst    Pneumonia    27 years ago   Squamous cell carcinoma of skin 08/14/2016   left above alar crease sidewall of nose/in situ    Review of systems:  Otherwise negative.    Physical Exam  Gen: Alert, oriented. Appears stated age.  HEENT: PERRLA. Lungs: No respiratory distress CV: RRR Abd: soft, benign, no masses Ext: No edema    Planned procedures: Proceed with EGD. The patient understands the nature of the planned procedure, indications, risks, alternatives and potential complications including but not limited to  bleeding, infection, perforation, damage to internal organs and possible oversedation/side effects from anesthesia. The patient agrees and gives consent to proceed.  Please refer to procedure notes for findings, recommendations and patient disposition/instructions.     Rodney ONEIDA Schick, MD [facility]

## 2023-11-21 NOTE — Interval H&P Note (Signed)
 History and Physical Interval Note:  11/21/2023 12:23 PM  Rodney Hall.  has presented today for surgery, with the diagnosis of IDA.  The various methods of treatment have been discussed with the patient and family. After consideration of risks, benefits and other options for treatment, the patient has consented to  Procedure(s): EGD (ESOPHAGOGASTRODUODENOSCOPY) (N/A) as a surgical intervention.  The patient's history has been reviewed, patient examined, no change in status, stable for surgery.  I have reviewed the patient's chart and labs.  Questions were answered to the patient's satisfaction.     Ole ONEIDA Schick  Ok to proceed with EGD

## 2023-11-24 LAB — SURGICAL PATHOLOGY

## 2023-12-03 ENCOUNTER — Ambulatory Visit (INDEPENDENT_AMBULATORY_CARE_PROVIDER_SITE_OTHER): Admitting: Urology

## 2023-12-03 VITALS — BP 102/62 | HR 97 | Wt 190.0 lb

## 2023-12-03 DIAGNOSIS — N3010 Interstitial cystitis (chronic) without hematuria: Secondary | ICD-10-CM | POA: Diagnosis not present

## 2023-12-03 DIAGNOSIS — N401 Enlarged prostate with lower urinary tract symptoms: Secondary | ICD-10-CM | POA: Diagnosis not present

## 2023-12-03 DIAGNOSIS — R399 Unspecified symptoms and signs involving the genitourinary system: Secondary | ICD-10-CM

## 2023-12-03 DIAGNOSIS — N200 Calculus of kidney: Secondary | ICD-10-CM | POA: Diagnosis not present

## 2023-12-03 DIAGNOSIS — N138 Other obstructive and reflux uropathy: Secondary | ICD-10-CM | POA: Diagnosis not present

## 2023-12-03 MED ORDER — TAMSULOSIN HCL 0.4 MG PO CAPS: 0.4000 mg | ORAL_CAPSULE | Freq: Every day | ORAL | 3 refills | Status: AC

## 2023-12-03 MED ORDER — IMIPRAMINE HCL 50 MG PO TABS: 50.0000 mg | ORAL_TABLET | Freq: Every day | ORAL | 3 refills | Status: AC

## 2023-12-03 NOTE — Progress Notes (Signed)
   12/03/2023 12:50 PM   Rodney Hall Rodney Hall. February 13, 1946 [id number]  Reason for visit: Follow up BPH, interstitial cystitis, nocturia  History: Followed by Dr. Gala long-term, 20+ year history of interstitial cystitis with primary complaint of nocturia.  Has been managed with avoiding bladder irritants and imipramine  and Flomax  PSA has been low and normal, no further screening needed per guideline recommendations Negative microscopic hematuria workup October 2024  Physical Exam: BP 102/62 (BP Location: Left Arm, Patient Position: Sitting, Cuff Size: Normal)   Pulse 97   Wt 190 lb (86.2 kg)   SpO2 95%   BMI 27.26 kg/m    Imaging/labs: I personally viewed and interpreted the CT stone protocol from March 2025 showing a 2 mm right distal ureteral stone, small bilateral nonobstructing stones  Today: Passed a stone spontaneously March 2025, no problems since then Reports urinary symptoms have been stable 20 to 30 years on Flomax  and imipramine , chronic nocturia 2-3 times per night not bothersome Would like to continue current plan of care  Plan:   Nephrolithiasis: Recent stone passage March 2025, small nonobstructing stones bilaterally, prevention strategies reviewed and return precautions discussed BPH/nocturia/interstitial cystitis: Has been well-controlled long-term on Flomax  and imipramine , medications refilled RTC 1 year PVR   Rodney JAYSON Burnet, MD  [facility] [address], [address] [place], [place] [address] [phone]

## 2023-12-03 NOTE — Patient Instructions (Signed)
 [name]

## 2023-12-04 ENCOUNTER — Ambulatory Visit: Payer: Self-pay | Admitting: Urology

## 2023-12-09 ENCOUNTER — Ambulatory Visit: Admitting: Pulmonary Disease

## 2023-12-09 ENCOUNTER — Encounter: Payer: Self-pay | Admitting: Pulmonary Disease

## 2023-12-09 VITALS — BP 110/72 | HR 80 | Temp 97.8°F | Ht 70.0 in | Wt 193.0 lb

## 2023-12-09 DIAGNOSIS — J849 Interstitial pulmonary disease, unspecified: Secondary | ICD-10-CM | POA: Diagnosis not present
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
## 2023-12-09 NOTE — Patient Instructions (Signed)
  VISIT SUMMARY: You came in for a follow-up on your lung condition. Your chest x-ray and CT scan showed mild changes, and your lung function test revealed a slight decrease. You also have severe back pain that limits your mobility.  YOUR PLAN: EARLY INTERSTITIAL LUNG DISEASE: Mild changes in your lungs were observed on the CT scan, and your lung function test showed a slight decrease. There are no significant exposure history or autoimmune conditions identified. -We will order laboratory tests to check for autoimmune conditions such as lupus or rheumatoid arthritis. -We will repeat the CT scan and lung function test in one year. -Please monitor for worsening symptoms such as increased congestion, cough, or difficulty breathing. -Contact the clinic if your symptoms worsen for earlier reassessment.

## 2023-12-11 LAB — ANA,IFA RA DIAG PNL W/RFLX TIT/PATN
Anti Nuclear Antibody (ANA): POSITIVE — AB
Cyclic Citrullin Peptide Ab: <16 U
Rheumatoid fact SerPl-aCnc: <10 [IU]/mL

## 2023-12-11 LAB — ANTI-NUCLEAR AB-TITER (ANA TITER)
ANA TITER: 1:160 {titer} — ABNORMAL HIGH
ANA Titer 1: 1:160 {titer} — ABNORMAL HIGH

## 2023-12-21 ENCOUNTER — Ambulatory Visit: Payer: Self-pay | Admitting: Pulmonary Disease

## 2023-12-22 NOTE — Progress Notes (Unsigned)
 PROVIDER NOTE: Interpretation of information contained herein should be left to medically-trained personnel. Specific patient instructions are provided elsewhere under Patient Instructions section of medical record. This document was created in part using AI and STT-dictation technology, any transcriptional errors that may result from this process are unintentional.  Patient: Rodney Hall.  Service: E/M   PCP: Gasper Nancyann BRAVO, MD  DOB: [birth year]  DOS: 12/24/2023  Provider: Eric DELENA Como, MD  MRN: [id number]  Delivery: Face-to-face  Specialty: Interventional Pain Management  Type: Established Patient  Setting: Ambulatory outpatient facility  Specialty designation: 09  Referring Prov.: Gasper Nancyann BRAVO, MD  Location: Outpatient office facility       History of present illness (HPI) Mr. Kailon Treese., a 78 y.o. year old male, is here today because of his No primary diagnosis found.. Mr. Runyon primary complain today is No chief complaint on file.  Pertinent problems: Mr. Rapaport has Impingement syndrome of shoulder region; History of spinal fusion; Lumbar post-laminectomy syndrome; Lumbosacral spondylosis with radiculopathy; Abnormal CT scan, lumbar spine (06/17/2016); Failed back surgical syndrome; Chronic low back pain (1ry area of Pain) (Bilateral) w/o sciatica; Chronic lower extremity pain (Left); Epidural fibrosis; Lumbosacral radiculopathy at S1 (Left); Lumbosacral radiculopathy at L5 (Left); Lumbar facet hypertrophy (Multilevel) (Bilateral); Abnormal MRI, lumbar spine (02/04/2023) [facility]); Lumbar facet joint pain; Spondylosis without myelopathy or radiculopathy, lumbosacral region; Lumbar facet joint syndrome; Low back pain of over 3 months duration; Mechanical low back pain; Multifactorial low back pain; Intractable low back pain; and Discogenic low back pain on their pertinent problem list.  Pain Assessment: Severity of   is reported as a  /10.  Location:    / . Onset:  . Quality:  . Timing:  . Modifying factor(s):  [name] Vitals:  vitals were not taken for this visit.  BMI: Estimated body mass index is 27.69 kg/m as calculated from the following:   Height as of 12/09/23: 5' 10 (1.778 m).   Weight as of 12/09/23: 193 lb (87.5 kg).  Last encounter: 09/11/2023. Last procedure: 08/28/2023.  Reason for encounter: evaluation of worsening, or previously known (established) problem.   Discussed the use of AI scribe software for clinical note transcription with the patient, who gave verbal consent to proceed.  History of Present Illness           Pharmacotherapy Assessment   Opioid Analgesic(s): No chronic opioid analgesics therapy prescribed by our practice. None. MME/day: 0 mg/day   Monitoring: [place] PMP: [name]       Pharmacotherapy: No side-effects or adverse reactions reported. Compliance: No problems identified. Effectiveness: Clinically acceptable.  No notes on file  UDS:  No results found for: SUMMARY  No results found for: CBDTHCR No results found for: D8THCCBX No results found for: D9THCCBX  ROS  Constitutional: Denies any fever or chills Gastrointestinal: No reported hemesis, hematochezia, vomiting, or acute GI distress Musculoskeletal: Denies any acute onset joint swelling, redness, loss of ROM, or weakness Neurological: No reported episodes of acute onset apraxia, aphasia, dysarthria, agnosia, amnesia, paralysis, loss of coordination, or loss of consciousness  Medication Review  acetaminophen , aspirin  EC, clopidogrel , ferrous sulfate, imipramine , omeprazole , tamsulosin , and terbinafine   History Review  Allergy: Mr. Luczynski is allergic to pravastatin  and amoxicillin. Drug: Mr. Kurt  reports no history of drug use. Alcohol:  reports no history of alcohol use. Tobacco:  reports that he has never smoked. He has never been exposed to tobacco smoke. He has  never used smokeless  tobacco. Social: Mr. Banks  reports that he has never smoked. He has never been exposed to tobacco smoke. He has never used smokeless tobacco. He reports that he does not drink alcohol and does not use drugs. 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 Surgical: Mr. Dunnaway  has a past surgical history that includes Back surgery (2012); Neck surgery (2000); Cataract extraction, bilateral; Tonsillectomy and adenoidectomy; Anterior lat lumbar fusion (Right, 07/30/2016); EUS (N/A, 07/10/2017); Tennis elbow release/nirschel procedure; Eye surgery; Cholecystectomy (N/A, 07/25/2017); Colonoscopy with propofol  (N/A, 12/12/2017); Hemi-microdiscectomy lumbar laminectomy level 1 (09/2018); LEFT HEART CATH AND CORONARY ANGIOGRAPHY (N/A, 09/04/2022); Coronary artery bypass graft (N/A, 09/07/2022); TEE without cardioversion (N/A, 09/07/2022); LEFT HEART CATH AND CORONARY ANGIOGRAPHY (N/A, 09/06/2022); IABP Insertion (N/A, 09/06/2022); Colonoscopy with propofol  (N/A, 06/27/2023); Polypectomy (06/27/2023); Spine surgery (4 times); and Esophagogastroduodenoscopy (N/A, 11/21/2023). Family: family history includes Cancer in his mother; Diabetes in his brother and father; Heart disease in his sister; Rheumatic fever in his sister.  Laboratory Chemistry Profile   Renal Lab Results  Component Value Date   BUN 20 05/29/2023   CREATININE 1.82 (H) 05/29/2023   BCR 9 (L) 04/01/2023   GFRAA 58 (L) 01/13/2019   GFRNONAA 38 (L) 05/29/2023    Hepatic Lab Results  Component Value  Date   AST 19 05/29/2023   ALT 14 05/29/2023   ALBUMIN  4.4 05/29/2023   ALKPHOS 61 05/29/2023   HCVAB <0.1 06/02/2017   LIPASE 39 05/29/2023    Electrolytes Lab Results  Component Value Date   NA 135 05/29/2023   K 4.7 05/29/2023   CL 102 05/29/2023   CALCIUM  9.2 05/29/2023   MG 2.3 09/08/2022   PHOS 3.5 02/22/2022    Bone No results found for: VD25OH, VD125OH2TOT, CI6874NY7, CI7874NY7, 25OHVITD1, 25OHVITD2, 25OHVITD3, TESTOFREE, TESTOSTERONE  Inflammation (CRP: Acute Phase) (ESR: Chronic Phase) No results found for: CRP, ESRSEDRATE, LATICACIDVEN       Note: Above Lab results reviewed.  Recent Imaging Review  CT CHEST HIGH RESOLUTION CLINICAL DATA:  Interstitial lung disease, shortness of breath  EXAM: CT CHEST WITHOUT CONTRAST  TECHNIQUE: Multidetector CT imaging of the chest was performed following the standard protocol without intravenous contrast. High resolution imaging of the lungs, as well as inspiratory and expiratory imaging, was performed.  RADIATION DOSE REDUCTION: This exam was performed according to the departmental dose-optimization program which includes automated exposure control, adjustment of the mA and/or kV according to patient size and/or use of iterative reconstruction technique.  COMPARISON:  None Available.  FINDINGS: Cardiovascular: Aortic atherosclerosis. Normal heart size. Three-vessel coronary artery calcifications status post median sternotomy and CABG. No pericardial effusion.  Mediastinum/Nodes: No enlarged mediastinal, hilar, or axillary lymph nodes. Small hiatal hernia. Thyroid  gland, trachea, and esophagus demonstrate no significant findings.  Lungs/Pleura: Mild, bibasilar predominant irregular peripheral interstitial opacity and ground-glass with minimal varicoid bronchiectasis, but without evidence of subpleural bronchiolectasis or honeycombing. No significant air trapping on expiratory  phase imaging. No pleural effusion or pneumothorax.  Upper Abdomen: No acute abnormality.  Musculoskeletal: No chest wall abnormality. No acute osseous findings.  IMPRESSION: 1. Mild, bibasilar predominant irregular peripheral interstitial opacity and ground-glass with minimal varicoid bronchiectasis, but without evidence of  subpleural bronchiolectasis or honeycombing. Findings are indeterminate for UIP per consensus guidelines: Diagnosis of Idiopathic Pulmonary Fibrosis: An Official ATS/ERS/JRS/ALAT Clinical Practice Guideline. Am JINNY Honey Crit Care Med Vol 198, Iss 5, [phone], Nov 02 2016. 2. Coronary artery disease status post median sternotomy and CABG.  Aortic Atherosclerosis (ICD10-I70.0).  Electronically Signed   By: Marolyn JONETTA Jaksch M.D.   On: 11/10/2023 14:46 Note: Reviewed        Physical Exam  Vitals: There were no vitals taken for this visit. BMI: Estimated body mass index is 27.69 kg/m as calculated from the following:   Height as of 12/09/23: 5' 10 (1.778 m).   Weight as of 12/09/23: 193 lb (87.5 kg). Ideal: Patient weight not recorded General appearance: Well nourished, well developed, and well hydrated. In no apparent acute distress Mental status: Alert, oriented x 3 (person, place, & time)       Respiratory: No evidence of acute respiratory distress Eyes: PERLA   Assessment   Diagnosis Status  No diagnosis found. Controlled Controlled Controlled   Updated Problems: No problems updated.  Plan of Care  Problem-specific:  Assessment and Plan            Mr. Nathanie Ottley. has a current medication list which includes the following long-term medication(s): ferrous sulfate and imipramine .  Pharmacotherapy (Medications Ordered): No orders of the defined types were placed in this encounter.  Orders:  No orders of the defined types were placed in this encounter.    Interventional Therapies  Risk Factors  Considerations  Medical  Comorbidities:  Plavix  Anticoagulation: (Stop: 7-10 days  Restart: 2 hours)  CAD  Hx. NSTEMI  S/P CABG x5  Stage 3b CKD  T2NIDDM        Planned  Pending:   Diagnostic bilateral lumbar facet MBB #1    Under consideration:   Diagnostic bilateral lumbar facet MBB #1  Possible Racz procedure #2    Completed:   Therapeutic left Racz procedure there #1 (06/12/2023) (100/100/80/80) Diagnostic/therapeutic left caudal ESI + diagnostic epidurogram x1 (05/08/2023) (100/100/50/50)   Therapeutic  Palliative (PRN) options:   None established   Completed by other providers:   Surgery: Right L2-3 lateral transpsoas interbody fusion w/ lateral plating and L2-3 decompression (07/30/2016) by Morene DOROTHA Stammer, MD (Neurosurgery)  Surgery: Left L3-4 and L4-5 anterolateral fusion with interbody spacer and right L3-4 and L4-5 pedicle screws (03/13/2010) by Darina Boehringer, MD (Neurosurgery)  [facility] Procedures Date Name LateralityProvider Name and Address 12/03/2022 G-RDR SNRB                                             Charlie Dolores, MD 01/17/2022 G-RDR SNRB 2 Level                                          Charlie Dolores, MD 07/03/2021 G-RDR SNRB 2 Level                               Manuelita Shoulder 05/01/2021 G-JCB Trigger Point                                 Reyes Billing,  MD 12/04/2020 G-RDR SNRB 2 Level                      Charlie Dolores, MD 07/18/2020 G-RDR SNRB                                              Charlie Dolores, MD 01/17/2020 [email or web address] Lumbar TFESIK U1 Level  Charleen Martins, MD 09/25/2018 Lumbar Laminectomy    09/20/2018 LAMINOTOMY (HEMILAMINECTOMY), DECOMPRESSION OF NERVE ROOTS, PARTIAL FACECTOTOMY, FORAMINOTOMY AND/OR DISC REMOVAL, LUMBAR (SURG) Shanda Lenis 07/30/2016 Lumbar Laminectomy   April Webb 03/07/2011 Lumbar Laminectomy   April Webb 03/06/2001 Lumbar Laminectomy      No follow-ups on file.    Recent Visits No visits were found meeting these  conditions. Showing recent visits within past 90 days and meeting all other requirements Future Appointments Date Type Provider Dept  12/24/23 Appointment Tanya Glisson, MD [facility]  12/29/23 Appointment Tanya Glisson, MD [facility]  Showing future appointments within next 90 days and meeting all other requirements  I discussed the assessment and treatment plan with the patient. The patient was provided an opportunity to ask questions and all were answered. The patient agreed with the plan and demonstrated an understanding of the instructions.  Patient advised to call back or seek an in-person evaluation if the symptoms or condition worsens.  Duration of encounter: *** minutes.  Total time on encounter, as per AMA guidelines included both the face-to-face and non-face-to-face time personally spent by the physician and/or other qualified health care professional(s) on the day of the encounter (includes time in activities that require the physician or other qualified health care professional and does not include time in activities normally performed by clinical staff). Physician's time may include the following activities when performed: Preparing to see the patient (e.g., pre-charting review of records, searching for previously ordered imaging, lab work, and nerve conduction tests) Review of prior analgesic pharmacotherapies. Reviewing PMP Interpreting ordered tests (e.g., lab work, imaging, nerve conduction tests) Performing post-procedure evaluations, including interpretation of diagnostic procedures Obtaining and/or reviewing separately obtained history Performing a medically appropriate examination and/or evaluation Counseling and educating the patient/family/caregiver Ordering medications, tests, or procedures Referring and communicating with other health care professionals (when not separately reported) Documenting clinical information in the electronic  or other health record Independently interpreting results (not separately reported) and communicating results to the patient/ family/caregiver Care coordination (not separately reported)  Note by: Glisson DELENA Tanya, MD (TTS and AI technology used. I apologize for any typographical errors that were not detected and corrected.) Date: 12/24/2023; Time: 7:17 PM

## 2023-12-24 ENCOUNTER — Ambulatory Visit (HOSPITAL_BASED_OUTPATIENT_CLINIC_OR_DEPARTMENT_OTHER): Admitting: Pain Medicine

## 2023-12-24 DIAGNOSIS — Z91199 Patient's noncompliance with other medical treatment and regimen due to unspecified reason: Secondary | ICD-10-CM

## 2023-12-24 DIAGNOSIS — G8929 Other chronic pain: Secondary | ICD-10-CM

## 2023-12-25 NOTE — Progress Notes (Signed)
 PROVIDER NOTE: Interpretation of information contained herein should be left to medically-trained personnel. Specific patient instructions are provided elsewhere under Patient Instructions section of medical record. This document was created in part using AI and STT-dictation technology, any transcriptional errors that may result from this process are unintentional.  Patient: Rodney Hall.  Service: E/M   PCP: Rodney Nancyann BRAVO, MD  DOB: Nov 24, 1945  DOS: 12/29/2023  Provider: Eric DELENA Como, MD  MRN: [id number]  Delivery: Face-to-face  Specialty: Interventional Pain Management  Type: Established Patient  Setting: Ambulatory outpatient facility  Specialty designation: 09  Referring Prov.: Rodney Nancyann BRAVO, MD  Location: Outpatient office facility       History of present illness (HPI) Mr. Rodney Parkerson., a 78 y.o. year old male, is here today because of his Chronic bilateral low back pain without sciatica [M54.50, G89.29]. Rodney Hall primary complain today is Back Pain (Lower bilateral )  Pertinent problems: Rodney Hall has Impingement syndrome of shoulder region; History of spinal fusion; Lumbar post-laminectomy syndrome; Lumbosacral spondylosis with radiculopathy; Abnormal CT scan, lumbar spine (06/17/2016); Failed back surgical syndrome; Chronic low back pain (1ry area of Pain) (Bilateral) w/o sciatica; Chronic lower extremity pain (Left); Epidural fibrosis; Lumbosacral radiculopathy at S1 (Left); Lumbosacral radiculopathy at L5 (Left); Lumbar facet hypertrophy (Multilevel) (Bilateral); Abnormal MRI, lumbar spine (02/04/2023) [facility]); Lumbar facet joint pain; Spondylosis without myelopathy or radiculopathy, lumbosacral region; Lumbar facet joint syndrome; Low back pain of over 3 months duration; Mechanical low back pain; Multifactorial low back pain; Intractable low back pain; and Discogenic low back pain on their pertinent problem list.  Pain Assessment:  Severity of Chronic pain is reported as a 4 /10. Location: Back Lower, Right, Left/Radiates from lower back into hips bilateral. Onset: More than a month ago. Quality: Constant, Aching, Sharp. Timing: Constant. Modifying factor(s): Denies. Vitals:  height is 5' 10 (1.778 m) and weight is 190 lb (86.2 kg). His temporal temperature is 97.4 F (36.3 C) (abnormal). His blood pressure is 114/77 and his pulse is 82. His respiration is 16 and oxygen saturation is 95%.  BMI: Estimated body mass index is 27.26 kg/m as calculated from the following:   Height as of this encounter: 5' 10 (1.778 m).   Weight as of this encounter: 190 lb (86.2 kg).  Last encounter: 12/24/2023. Last procedure: 08/28/2023.  Reason for encounter: evaluation of worsening, or previously known (established) problem.   Discussed the use of AI scribe software for clinical note transcription with the patient, who gave verbal consent to proceed.  History of Present Illness   Rodney Hall. is a 78 year old male with lumbar facet joint issues who presents with worsening back pain.  His back pain has returned to its previous level or slightly worse since his last treatment. The pain is primarily in the lower back, right hip, and occasionally high on the left hip, without radiation down the legs. Pain is more pronounced in the morning, with additional discomfort in the lower back and right hip by the afternoon.  Approximately three to four months ago, he underwent a procedure referred to as 'Rax' and has had two lumbar facet blocks in the past, which provided temporary relief. He experiences significant pain when walking, needing to sit down after about 100 feet due to intensity. Previously, sitting or lying down alleviated his pain, but now he experiences discomfort in these positions as well, particularly at night, disrupting his sleep. He finds some relief  by changing positions.  He has undergone spinal surgery, including the  placement of hardware and fusion of three or four discs. The right side of his lower back and buttock is currently the most painful area.       Pharmacotherapy Assessment   Opioid Analgesic(s): No chronic opioid analgesics therapy prescribed by our practice. None. MME/day: 0 mg/day   Monitoring: [place] PMP: [name]       Pharmacotherapy: No side-effects or adverse reactions reported. Compliance: No problems identified. Effectiveness: Clinically acceptable.  Rodney Norris, RN  12/29/2023  1:37 PM  Sign when Signing Visit Safety precautions to be maintained throughout the outpatient stay will include: orient to surroundings, keep bed in low position, maintain call bell within reach at all times, provide assistance with transfer out of bed and ambulation.     UDS:  No results found for: SUMMARY  No results found for: CBDTHCR No results found for: D8THCCBX No results found for: D9THCCBX  ROS  Constitutional: Denies any fever or chills Gastrointestinal: No reported hemesis, hematochezia, vomiting, or acute GI distress Musculoskeletal: Denies any acute onset joint swelling, redness, loss of ROM, or weakness Neurological: No reported episodes of acute onset apraxia, aphasia, dysarthria, agnosia, amnesia, paralysis, loss of coordination, or loss of consciousness  Medication Review  acetaminophen , aspirin  EC, clopidogrel , ferrous sulfate, imipramine , omeprazole , tamsulosin , and terbinafine   History Review  Allergy: Rodney Hall is allergic to pravastatin  and amoxicillin. Drug: Rodney Hall  reports no history of drug use. Alcohol:  reports no history of alcohol use. Tobacco:  reports that he has never smoked. He has never been exposed to tobacco smoke. He has never used smokeless tobacco. Social: Rodney Hall  reports that he has never smoked. He has never been exposed to tobacco smoke. He has never used smokeless tobacco. He reports that he does not drink  alcohol and does not use drugs. 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 Surgical: Rodney Hall  has a past surgical history that includes Back surgery (2012); Neck surgery (2000); Cataract extraction, bilateral; Tonsillectomy and adenoidectomy; Anterior lat lumbar fusion (Right, 07/30/2016); EUS (N/A, 07/10/2017); Tennis elbow release/nirschel procedure; Eye surgery; Cholecystectomy (N/A, 07/25/2017); Colonoscopy with propofol  (N/A, 12/12/2017); Hemi-microdiscectomy lumbar laminectomy level 1 (09/2018); LEFT HEART CATH AND CORONARY ANGIOGRAPHY (N/A, 09/04/2022); Coronary artery bypass graft (N/A, 09/07/2022); TEE without cardioversion (N/A, 09/07/2022); LEFT HEART CATH AND CORONARY ANGIOGRAPHY (N/A, 09/06/2022); IABP Insertion (N/A, 09/06/2022); Colonoscopy with propofol  (N/A, 06/27/2023); Polypectomy (06/27/2023); Spine surgery (4 times); and Esophagogastroduodenoscopy (N/A, 11/21/2023). Family: family history includes Cancer in his mother; Diabetes in his brother and father; Heart disease in his sister; Rheumatic fever in his sister.  Laboratory Chemistry Profile   Renal Lab Results  Component Value Date   BUN 20 05/29/2023   CREATININE 1.82 (H) 05/29/2023   BCR 9 (L) 04/01/2023   GFRAA 58 (L) 01/13/2019   GFRNONAA 38 (L) 05/29/2023    Hepatic Lab Results  Component Value Date   AST 19 05/29/2023   ALT 14 05/29/2023   ALBUMIN  4.4 05/29/2023   ALKPHOS 61 05/29/2023   HCVAB <0.1 06/02/2017   LIPASE 39 05/29/2023    Electrolytes Lab Results   Component Value Date  NA 135 05/29/2023   K 4.7 05/29/2023   CL 102 05/29/2023   CALCIUM  9.2 05/29/2023   MG 2.3 09/08/2022   PHOS 3.5 02/22/2022    Bone No results found for: VD25OH, VD125OH2TOT, CI6874NY7, CI7874NY7, 25OHVITD1, 25OHVITD2, 25OHVITD3, TESTOFREE, TESTOSTERONE  Inflammation (CRP: Acute Phase) (ESR: Chronic Phase) No results found for: CRP, ESRSEDRATE, LATICACIDVEN       Note: Above Lab results reviewed.  Recent Imaging Review  CT CHEST HIGH RESOLUTION CLINICAL DATA:  Interstitial lung disease, shortness of breath  EXAM: CT CHEST WITHOUT CONTRAST  TECHNIQUE: Multidetector CT imaging of the chest was performed following the standard protocol without intravenous contrast. High resolution imaging of the lungs, as well as inspiratory and expiratory imaging, was performed.  RADIATION DOSE REDUCTION: This exam was performed according to the departmental dose-optimization program which includes automated exposure control, adjustment of the mA and/or kV according to patient size and/or use of iterative reconstruction technique.  COMPARISON:  None Available.  FINDINGS: Cardiovascular: Aortic atherosclerosis. Normal heart size. Three-vessel coronary artery calcifications status post median sternotomy and CABG. No pericardial effusion.  Mediastinum/Nodes: No enlarged mediastinal, hilar, or axillary lymph nodes. Small hiatal hernia. Thyroid  gland, trachea, and esophagus demonstrate no significant findings.  Lungs/Pleura: Mild, bibasilar predominant irregular peripheral interstitial opacity and ground-glass with minimal varicoid bronchiectasis, but without evidence of subpleural bronchiolectasis or honeycombing. No significant air trapping on expiratory phase imaging. No pleural effusion or pneumothorax.  Upper Abdomen: No acute abnormality.  Musculoskeletal: No chest wall abnormality. No acute osseous findings.  IMPRESSION: 1. Mild,  bibasilar predominant irregular peripheral interstitial opacity and ground-glass with minimal varicoid bronchiectasis, but without evidence of subpleural bronchiolectasis or honeycombing. Findings are indeterminate for UIP per consensus guidelines: Diagnosis of Idiopathic Pulmonary Fibrosis: An Official ATS/ERS/JRS/ALAT Clinical Practice Guideline. Am JINNY Honey Crit Care Med Vol 198, Iss 5, [phone], Nov 02 2016. 2. Coronary artery disease status post median sternotomy and CABG.  Aortic Atherosclerosis (ICD10-I70.0).  Electronically Signed   By: Marolyn JONETTA Jaksch M.D.   On: 11/10/2023 14:46 Note: Reviewed        Physical Exam  Vitals: BP 114/77 (Patient Position: Sitting)   Pulse 82   Temp (!) 97.4 F (36.3 C) (Temporal)   Resp 16   Ht 5' 10 (1.778 m)   Wt 190 lb (86.2 kg)   SpO2 95%   BMI 27.26 kg/m  BMI: Estimated body mass index is 27.26 kg/m as calculated from the following:   Height as of this encounter: 5' 10 (1.778 m).   Weight as of this encounter: 190 lb (86.2 kg). Ideal: Ideal body weight: 73 kg (160 lb 15 oz) Adjusted ideal body weight: 78.3 kg (172 lb 9 oz) General appearance: Well nourished, well developed, and well hydrated. In no apparent acute distress Mental status: Alert, oriented x 3 (person, place, & time)       Respiratory: No evidence of acute respiratory distress Eyes: PERLA Physical Exam   MUSCULOSKELETAL: Spine hyperextension does not cause pain. Spine rotation causes pain bilaterally. Right and left leg straight leg raises do not cause pain. Right and left hip flexion causes mild pain.  Kemp maneuver was positive bilaterally for facet joint pain upon loading the ipsilateral facet joints. NEUROLOGICAL: Gait assessment reveals difficulty walking more than 100 feet due to pain.       Assessment   Diagnosis Status  1. 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 Low back pain of over 3 months  duration   3. Lumbar facet joint  pain   4. Lumbar facet hypertrophy (Multilevel) (Bilateral)   5. Lumbar facet joint syndrome   6. Lumbar post-laminectomy syndrome   7. Chronic anticoagulation (Plavix )    Controlled Controlled Controlled   Updated Problems: No problems updated.  Plan of Care  Problem-specific:  Assessment and Plan    Chronic low back pain with right greater than left hip pain after lumbar fusion   Chronic low back pain with more severe right hip pain is exacerbated by walking and sitting, partially relieved by changing positions, and limits walking to 100 feet before resting. Previous lumbar facet blocks provided temporary relief, indicating facet joint involvement. Pain is returning, suggesting the need for a long-term solution. Plan radiofrequency ablation for the right side to provide longer-lasting pain relief. Obtain preapproval from the insurance company and schedule the procedure once approved. Ensure he has a driver due to sedation requirements. Administer local anesthetic and sedation during the procedure. Provide pain medication post-procedure for recovery from the burn, which may last up to six weeks. Plan to perform the procedure on the left side two weeks after the right side.  Lumbar spondylosis   Lumbar spondylosis contributes to chronic low back pain. Previous lumbar fusion and presence of hardware are noted. Pain management strategies focus on addressing facet joint pain, likely exacerbated by spondylosis.       Mr. Rowan Blaker. has a current medication list which includes the following long-term medication(s): ferrous sulfate and imipramine .  Pharmacotherapy (Medications Ordered): No orders of the defined types were placed in this encounter.  Orders:  Orders Placed This Encounter  Procedures   Radiofrequency,Lumbar    Standing Status:   Future    Expiration Date:   03/30/2024    Scheduling Instructions:     Side(s): Right-sided     Level: L3-4, L4-5, and L5-S1 Facets (L2,  L3, L4, L5, and S1 Medial Branch)     Sedation: With Sedation.     Timeframe: see Blood-thinner protocol.    Where will this procedure be performed?:   [facility]   Blood Thinner Instructions to Nursing    Always make sure patient has clearance from prescribing physician to stop blood thinners for interventional therapies. If the patient requires a Lovenox -bridge therapy, make sure arrangements are made to institute it with the assistance of the PCP.    Scheduling Instructions:     Have Mr. Mackert stop the Plavix  (Clopidogrel ) x 7-10 days prior to procedure or surgery.     Interventional Therapies  Risk Factors  Considerations  Medical Comorbidities:  Plavix  Anticoagulation: (Stop: 7-10 days  Restart: 2 hours)  CAD  Hx. NSTEMI  S/P CABG x5  Stage 3b CKD  T2NIDDM        Planned  Pending:   Diagnostic bilateral lumbar facet RFA (Starting with the right)    Under consideration:   Diagnostic bilateral lumbar facet MBB #1  Possible Racz procedure #2    Completed:   Diagnostic bilateral lumbar facet MBB x2 (08/28/2023)  Therapeutic left Racz procedure there x1 (06/12/2023) (100/100/80/80)  Diagnostic/therapeutic left caudal ESI + diagnostic epidurogram x1 (05/08/2023) (100/100/50/50)    Therapeutic  Palliative (PRN) options:   None established   Completed by other providers:   Surgery: Right L2-3 lateral transpsoas interbody fusion w/ lateral plating and L2-3 decompression (07/30/2016) by Morene DOROTHA Stammer, MD (Neurosurgery)  Surgery: Left L3-4 and L4-5 anterolateral fusion with interbody spacer and right L3-4 and  L4-5 pedicle screws (03/13/2010) by Darina Boehringer, MD (Neurosurgery)  [facility] Procedures Date Name LateralityProvider Name and Address 12/03/2022 G-RDR SNRB                                             Charlie Dolores, MD 01/17/2022 G-RDR SNRB 2 Level                                          Charlie Dolores, MD 07/03/2021 G-RDR SNRB 2 Level                                Manuelita Shoulder 05/01/2021 G-JCB Trigger Point                                 Reyes Billing, MD 12/04/2020 G-RDR SNRB 2 Level                      Charlie Dolores, MD 07/18/2020 G-RDR SNRB                                              Charlie Dolores, MD 01/17/2020 [email or web address] Lumbar TFESIK U1 Level  Charleen Martins, MD 09/25/2018 Lumbar Laminectomy    09/20/2018 LAMINOTOMY (HEMILAMINECTOMY), DECOMPRESSION OF NERVE ROOTS, PARTIAL FACECTOTOMY, FORAMINOTOMY AND/OR DISC REMOVAL, LUMBAR (SURG) Shanda Lenis 07/30/2016 Lumbar Laminectomy   April Webb 03/07/2011 Lumbar Laminectomy   April Webb 03/06/2001 Lumbar Laminectomy      Return for ( ), (ECT): (R) L-FCT RFA #1, (Blood Thinner Protocol).    Recent Visits No visits were found meeting these conditions. Showing recent visits within past 90 days and meeting all other requirements Today's Visits Date Type Provider Dept  12/29/23 Office Visit Tanya Glisson, MD [facility]  Showing today's visits and meeting all other requirements Future Appointments Date Type Provider Dept  01/13/24 Appointment Tanya Glisson, MD [facility]  Showing future appointments within next 90 days and meeting all other requirements  I discussed the assessment and treatment plan with the patient. The patient was provided an opportunity to ask questions and all were answered. The patient agreed with the plan and demonstrated an understanding of the instructions.  Patient advised to call back or seek an in-person evaluation if the symptoms or condition worsens.  Duration of encounter: 30 minutes.  Total time on encounter, as per AMA guidelines included both the face-to-face and non-face-to-face time personally spent by the physician and/or other qualified health care professional(s) on the day of the encounter (includes time in activities that require the physician or other qualified health care professional and does not include  time in activities normally performed by clinical staff). Physician's time may include the following activities when performed: Preparing to see the patient (e.g., pre-charting review of records, searching for previously ordered imaging, lab work, and nerve conduction tests) Review of prior analgesic pharmacotherapies. Reviewing PMP Interpreting ordered tests (e.g., lab work, imaging, nerve conduction tests) Performing post-procedure evaluations, including interpretation of diagnostic procedures Obtaining and/or reviewing separately obtained history Performing a  medically appropriate examination and/or evaluation Counseling and educating the patient/family/caregiver Ordering medications, tests, or procedures Referring and communicating with other health care professionals (when not separately reported) Documenting clinical information in the electronic or other health record Independently interpreting results (not separately reported) and communicating results to the patient/ family/caregiver Care coordination (not separately reported)  Note by: Rodney DELENA Como, MD (TTS and AI technology used. I apologize for any typographical errors that were not detected and corrected.) Date: 12/29/2023; Time: 2:18 PM

## 2023-12-28 ENCOUNTER — Other Ambulatory Visit: Payer: Self-pay | Admitting: Physician Assistant

## 2023-12-28 DIAGNOSIS — E785 Hyperlipidemia, unspecified: Secondary | ICD-10-CM

## 2023-12-29 ENCOUNTER — Encounter: Payer: Self-pay | Admitting: Pain Medicine

## 2023-12-29 ENCOUNTER — Ambulatory Visit: Attending: Pain Medicine | Admitting: Pain Medicine

## 2023-12-29 VITALS — BP 114/77 | HR 82 | Temp 97.4°F | Resp 16 | Ht 70.0 in | Wt 190.0 lb

## 2023-12-29 DIAGNOSIS — Z7901 Long term (current) use of anticoagulants: Secondary | ICD-10-CM | POA: Diagnosis not present

## 2023-12-29 DIAGNOSIS — M47816 Spondylosis without myelopathy or radiculopathy, lumbar region: Secondary | ICD-10-CM | POA: Insufficient documentation

## 2023-12-29 DIAGNOSIS — G8929 Other chronic pain: Secondary | ICD-10-CM | POA: Insufficient documentation

## 2023-12-29 DIAGNOSIS — M961 Postlaminectomy syndrome, not elsewhere classified: Secondary | ICD-10-CM | POA: Diagnosis present

## 2023-12-29 DIAGNOSIS — M5459 Other low back pain: Secondary | ICD-10-CM | POA: Insufficient documentation

## 2023-12-29 DIAGNOSIS — M545 Low back pain, unspecified: Secondary | ICD-10-CM | POA: Diagnosis present

## 2023-12-29 NOTE — Patient Instructions (Signed)
 ______________________________________________________________________    Procedure instructions  Stop blood-thinners  Do not eat or drink fluids (other than water) for 6 hours before your procedure  No water for 2 hours before your procedure  Take your blood pressure medicine with a sip of water  Arrive 30 minutes before your appointment  If sedation is planned, bring suitable driver. [place], [place], & public transportation are NOT APPROVED)  Carefully read the Preparing for your procedure detailed instructions  If you have questions call us  at [phone]  Procedure appointments are for procedures only.   NO medication refills or new problem evaluations will be done on procedure days.   Only the scheduled, pre-approved procedure and side will be done.   ______________________________________________________________________      ______________________________________________________________________    Preparing for your procedure  Appointments: If you think you may not be able to keep your appointment, call 24-48 hours in advance to cancel. We need time to make it available to others.  Procedure visits are for procedures only. During your procedure appointment there will be: NO Prescription Refills*. NO medication changes or discussions*. NO discussion of disability issues*. NO unrelated pain problem evaluations*. NO evaluations to order other pain procedures*. *These will be addressed at a separate and distinct evaluation encounter on the provider's evaluation schedule and not during procedure days.  Instructions: Food intake: Avoid eating anything solid for at least 8 hours prior to your procedure. Clear liquid intake: You may take clear liquids such as water up to 2 hours prior to your procedure. (No carbonated drinks. No soda.) Transportation: Unless otherwise stated by your physician, bring a driver. (Driver cannot be a Market researcher, Pharmacist, community, or any other form of public  transportation.) Morning Medicines: Except for blood thinners, take all of your other morning medications with a sip of water. Make sure to take your heart and blood pressure medicines. If your blood pressure's lower number is above 100, the case will be rescheduled. Blood thinners: Make sure to stop your blood thinners as instructed.  If you take a blood thinner, but were not instructed to stop it, call our office [phone] and ask to talk to a nurse. Not stopping a blood thinner prior to certain procedures could lead to serious complications. Diabetics on insulin : Notify the staff so that you can be scheduled 1st case in the morning. If your diabetes requires high dose insulin , take only  of your normal insulin  dose the morning of the procedure and notify the staff that you have done so. Preventing infections: Shower with an antibacterial soap the morning of your procedure.  Build-up your immune system: Take 1000 mg of Vitamin C with every meal (3 times a day) the day prior to your procedure. Antibiotics: Inform the nursing staff if you are taking any antibiotics or if you have any conditions that may require antibiotics prior to procedures. (Example: recent joint implants)   Pregnancy: If you are pregnant make sure to notify the nursing staff. Not doing so may result in injury to the fetus, including death.  Sickness: If you have a cold, fever, or any active infections, call and cancel or reschedule your procedure. Receiving steroids while having an infection may result in complications. Arrival: You must be in the facility at least 30 minutes prior to your scheduled procedure. Tardiness: Your scheduled time is also the cutoff time. If you do not arrive at least 15 minutes prior to your procedure, you will be rescheduled.  Children: Do not bring any children  with you. Make arrangements to keep them home. Dress appropriately: There is always a possibility that your clothing may get soiled. Avoid  long dresses. Valuables: Do not bring any jewelry or valuables.  Reasons to call and reschedule or cancel your procedure: (Following these recommendations will minimize the risk of a serious complication.) Surgeries: Avoid having procedures within 2 weeks of any surgery. (Avoid for 2 weeks before or after any surgery). Flu Shots: Avoid having procedures within 2 weeks of a flu shots or . (Avoid for 2 weeks before or after immunizations). Barium: Avoid having a procedure within 7-10 days after having had a radiological study involving the use of radiological contrast. (Myelograms, Barium swallow or enema study). Heart attacks: Avoid any elective procedures or surgeries for the initial 6 months after a Myocardial Infarction (Heart Attack). Blood thinners: It is imperative that you stop these medications before procedures. Let us  know if you if you take any blood thinner.  Infection: Avoid procedures during or within two weeks of an infection (including chest colds or gastrointestinal problems). Symptoms associated with infections include: Localized redness, fever, chills, night sweats or profuse sweating, burning sensation when voiding, cough, congestion, stuffiness, runny nose, sore throat, diarrhea, nausea, vomiting, cold or Flu symptoms, recent or current infections. It is specially important if the infection is over the area that we intend to treat. Heart and lung problems: Symptoms that may suggest an active cardiopulmonary problem include: cough, chest pain, breathing difficulties or shortness of breath, dizziness, ankle swelling, uncontrolled high or unusually low blood pressure, and/or palpitations. If you are experiencing any of these symptoms, cancel your procedure and contact your primary care physician for an evaluation.  Remember:  Regular Business hours are:  Monday to Thursday 8:00 AM to 4:00 PM  Provider's Schedule: Eric Como, MD:  Procedure days: Tuesday and Thursday 7:30  AM to 4:00 PM  Wallie Sherry, MD:  Procedure days: Monday and Wednesday 7:30 AM to 4:00 PM Last  Updated: 02/11/2023 ______________________________________________________________________      ______________________________________________________________________    General Risks and Possible Complications  Patient Responsibilities: It is important that you read this as it is part of your informed consent. It is our duty to inform you of the risks and possible complications associated with treatments offered to you. It is your responsibility as a patient to read this and to ask questions about anything that is not clear or that you believe was not covered in this document.  Patient's Rights: You have the right to refuse treatment. You also have the right to change your mind, even after initially having agreed to have the treatment done. However, under this last option, if you wait until the last second to change your mind, you may be charged for the materials used up to that point.  Introduction: Medicine is not an Visual merchandiser. Everything in Medicine, including the lack of treatment(s), carries the potential for danger, harm, or loss (which is by definition: Risk). In Medicine, a complication is a secondary problem, condition, or disease that can aggravate an already existing one. All treatments carry the risk of possible complications. The fact that a side effects or complications occurs, does not imply that the treatment was conducted incorrectly. It must be clearly understood that these can happen even when everything is done following the highest safety standards.  No treatment: You can choose not to proceed with the proposed treatment alternative. The "PRO(s)" would include: avoiding the risk of complications associated with the therapy. The "CON(s)"  would include: not getting any of the treatment benefits. These benefits fall under one of three categories: diagnostic; therapeutic; and/or  palliative. Diagnostic benefits include: getting information which can ultimately lead to improvement of the disease or symptom(s). Therapeutic benefits are those associated with the successful treatment of the disease. Finally, palliative benefits are those related to the decrease of the primary symptoms, without necessarily curing the condition (example: decreasing the pain from a flare-up of a chronic condition, such as incurable terminal cancer).  General Risks and Complications: These are associated to most interventional treatments. They can occur alone, or in combination. They fall under one of the following six (6) categories: no benefit or worsening of symptoms; bleeding; infection; nerve damage; allergic reactions; and/or death. No benefits or worsening of symptoms: In Medicine there are no guarantees, only probabilities. No healthcare provider can ever guarantee that a medical treatment will work, they can only state the probability that it may. Furthermore, there is always the possibility that the condition may worsen, either directly, or indirectly, as a consequence of the treatment. Bleeding: This is more common if the patient is taking a blood thinner, either prescription or over the counter (example: Goody Powders, Fish oil, Aspirin , Garlic, etc.), or if suffering a condition associated with impaired coagulation (example: Hemophilia, cirrhosis of the liver, low platelet counts, etc.). However, even if you do not have one on these, it can still happen. If you have any of these conditions, or take one of these drugs, make sure to notify your treating physician. Infection: This is more common in patients with a compromised immune system, either due to disease (example: diabetes, cancer, human immunodeficiency virus [HIV], etc.), or due to medications or treatments (example: therapies used to treat cancer and rheumatological diseases). However, even if you do not have one on these, it can still  happen. If you have any of these conditions, or take one of these drugs, make sure to notify your treating physician. Nerve Damage: This is more common when the treatment is an invasive one, but it can also happen with the use of medications, such as those used in the treatment of cancer. The damage can occur to small secondary nerves, or to large primary ones, such as those in the spinal cord and brain. This damage may be temporary or permanent and it may lead to impairments that can range from temporary numbness to permanent paralysis and/or brain death. Allergic Reactions: Any time a substance or material comes in contact with our body, there is the possibility of an allergic reaction. These can range from a mild skin rash (contact dermatitis) to a severe systemic reaction (anaphylactic reaction), which can result in death. Death: In general, any medical intervention can result in death, most of the time due to an unforeseen complication. ______________________________________________________________________      ______________________________________________________________________    Blood Thinners  IMPORTANT NOTICE:  If you take any of these, make sure to notify the nursing staff.  Failure to do so may result in serious injury.  Recommended time intervals to stop and restart blood-thinners, before & after invasive procedures  Generic Name Brand Name Pre-procedure: Stop medication for this amount of time before your procedure: Post-procedure: Wait this amount of time after the procedure before restarting your medication:  Abciximab Reopro 15 days 2 hrs  Alteplase Activase 10 days 10 days  Anagrelide Agrylin    Apixaban Eliquis 3 days 6 hrs  Cilostazol Pletal 3 days 5 hrs  Clopidogrel  Plavix  7-10 days 2  hrs  Dabigatran Pradaxa 5 days 6 hrs  Dalteparin Fragmin 24 hours 4 hrs  Dipyridamole Aggrenox 11days 2 hrs  Edoxaban Lixiana; Savaysa 3 days 2 hrs  Enoxaparin   Lovenox  24 hours 4 hrs   Eptifibatide Integrillin 8 hours 2 hrs  Fondaparinux  Arixtra 72 hours 12 hrs  Hydroxychloroquine Plaquenil 11 days   Prasugrel Effient 7-10 days 6 hrs  Reteplase Retavase 10 days 10 days  Rivaroxaban Xarelto 3 days 6 hrs  Ticagrelor Brilinta 5-7 days 6 hrs  Ticlopidine Ticlid 10-14 days 2 hrs  Tinzaparin Innohep 24 hours 4 hrs  Tirofiban Aggrastat 8 hours 2 hrs  Warfarin Coumadin 5 days 2 hrs   Other medications with blood-thinning effects  NOTE: Consider stopping these if you have prolonged bleeding despite not taking any of the above blood thinners. Otherwise ask your provider and this will be decided on a case-by-case basis.  Product indications Generic (Brand) names Note  Cholesterol Lipitor Stop 4 days before procedure  Blood thinner (injectable) Heparin  (LMW or LMWH Heparin ) Stop 24 hours before procedure  Cancer Ibrutinib (Imbruvica) Stop 7 days before procedure  Malaria/Rheumatoid Hydroxychloroquine (Plaquenil) Stop 11 days before procedure  Thrombolytics  10 days before or after procedures   Over-the-counter (OTC) Products with blood-thinning effects  NOTE: Consider stopping these if you have prolonged bleeding despite not taking any of the above blood thinners. Otherwise ask your provider and this will be decided on a case-by-case basis.  Product Common names Stop Time  Aspirin  > 325 mg Goody Powders, Excedrin, etc. 11 days  Aspirin  <= 81 mg  7 days  Fish oil  4 days  Garlic supplements  7 days  Ginkgo biloba  36 hours  Ginseng  24 hours  NSAIDs Ibuprofen , Naprosyn , etc. 3 days  Vitamin E  4 days   ______________________________________________________________________

## 2023-12-29 NOTE — Progress Notes (Signed)
 Safety precautions to be maintained throughout the outpatient stay will include: orient to surroundings, keep bed in low position, maintain call bell within reach at all times, provide assistance with transfer out of bed and ambulation.

## 2023-12-30 DIAGNOSIS — E781 Pure hyperglyceridemia: Secondary | ICD-10-CM | POA: Diagnosis not present

## 2023-12-30 DIAGNOSIS — R7303 Prediabetes: Secondary | ICD-10-CM | POA: Diagnosis not present

## 2023-12-30 DIAGNOSIS — N400 Enlarged prostate without lower urinary tract symptoms: Secondary | ICD-10-CM | POA: Diagnosis not present

## 2023-12-30 DIAGNOSIS — N1832 Chronic kidney disease, stage 3b: Secondary | ICD-10-CM | POA: Diagnosis not present

## 2023-12-30 DIAGNOSIS — Z9889 Other specified postprocedural states: Secondary | ICD-10-CM | POA: Diagnosis not present

## 2023-12-30 DIAGNOSIS — I10 Essential (primary) hypertension: Secondary | ICD-10-CM | POA: Diagnosis not present

## 2023-12-30 DIAGNOSIS — N281 Cyst of kidney, acquired: Secondary | ICD-10-CM | POA: Diagnosis not present

## 2023-12-30 DIAGNOSIS — N200 Calculus of kidney: Secondary | ICD-10-CM | POA: Diagnosis not present

## 2023-12-30 DIAGNOSIS — E1122 Type 2 diabetes mellitus with diabetic chronic kidney disease: Secondary | ICD-10-CM | POA: Diagnosis not present

## 2024-01-06 DIAGNOSIS — R8290 Unspecified abnormal findings in urine: Secondary | ICD-10-CM | POA: Diagnosis not present

## 2024-01-06 DIAGNOSIS — N281 Cyst of kidney, acquired: Secondary | ICD-10-CM | POA: Diagnosis not present

## 2024-01-06 DIAGNOSIS — N200 Calculus of kidney: Secondary | ICD-10-CM | POA: Diagnosis not present

## 2024-01-06 DIAGNOSIS — E781 Pure hyperglyceridemia: Secondary | ICD-10-CM | POA: Diagnosis not present

## 2024-01-06 DIAGNOSIS — N400 Enlarged prostate without lower urinary tract symptoms: Secondary | ICD-10-CM | POA: Diagnosis not present

## 2024-01-06 DIAGNOSIS — R7303 Prediabetes: Secondary | ICD-10-CM | POA: Diagnosis not present

## 2024-01-06 DIAGNOSIS — E1122 Type 2 diabetes mellitus with diabetic chronic kidney disease: Secondary | ICD-10-CM | POA: Diagnosis not present

## 2024-01-06 DIAGNOSIS — N1832 Chronic kidney disease, stage 3b: Secondary | ICD-10-CM | POA: Diagnosis not present

## 2024-01-06 DIAGNOSIS — Z9889 Other specified postprocedural states: Secondary | ICD-10-CM | POA: Diagnosis not present

## 2024-01-06 DIAGNOSIS — I10 Essential (primary) hypertension: Secondary | ICD-10-CM | POA: Diagnosis not present

## 2024-01-07 NOTE — Progress Notes (Signed)
 Cardiology Office Note    Date:  01/08/2024   ID:  Rodney Hall., DOB [birth year], MRN [id number]  PCP:  Gasper Nancyann BRAVO, MD  Cardiologist:  Evalene Lunger, MD  Electrophysiologist:  None   Chief Complaint: Follow up  History of Present Illness:   Rodney Thieme. is a 78 y.o. male with history of CAD status post 5-vessel CABG on 09/07/2022, postoperative A-fib, DVT, rectal bleeding with mild IDA, CKD stage IIIb, HLD, interstitial cystitis, BPH, and nephrolithiasis who presents for follow-up of CAD.   He was admitted to the hospital in 09/2022 with NSTEMI.  LHC on 09/04/2022 showed severe three-vessel CAD including sequential 90% stenoses of the mid/distal LAD, 70% mid LCx stenosis, occluded distal segment of OM1, and 80 to 90% ostial rPDA and proximal rPLA stenoses.  Moderately to severely reduced LV systolic function with an EF of 30 to 35% with apical dyskinesis.  LVEDP 15 mmHg.  In this setting, he was transferred to [facility] for consideration of CABG.  Due to refractory angina, he underwent IABP placement with stable coronary anatomy by cardiac cath on 09/06/2022.  He subsequently underwent 5 vessel CABG on 09/07/2022 with LIMA to LAD, SVG to diagonal, SVG to OM 2, and sequential SVG to rPDA and rPLA.  Postoperative course was notable for bifascicular block leading beta-blocker to be deferred, atrial fibrillation converting to sinus rhythm with amiodarone , volume overload with subsequent AKI, confusion, and agitation.   He was seen in the office on 10/01/2022 and was without symptoms of angina or cardiac decompensation.  He reported prior jitteriness with statin therapy.  Zio patch following discharge showed a predominant rhythm of sinus with an average rate of 74 bpm (range 51 to 125 bpm), 3 episodes of SVT lasting up to 9 beats, and rare atrial and ventricular ectopy.  Patient triggered events were associated with sinus rhythm.  He followed up with CVTS on 10/30/2022 and was doing well from  a surgical perspective.  Amiodarone  was discontinued at that time.  He was evaluated by the lipid clinic and with recommendation to start Repatha .  He was seen in the office in 12/2022 and remained active at baseline with main limiting factor noted to be chronic low back pain.  He had not yet started Repatha  with concerns regarding given himself injections.  He preferred to retry low-dose of atorvastatin  and was started on Lipitor 10 mg daily.  He was seen in the ED in 02/2023 with hematochezia with recommendation to start PPI and follow-up with GI as outpatient.  Echo in 04/2023 showed an EF of 55 to 60%, no regional wall motion abnormalities, mildly reduced RV systolic function with normal ventricular cavity size, mildly dilated left atrium, mild mitral regurgitation, and an estimated right atrial pressure of 3 mmHg.   He was seen in the ED on 05/29/2023 ureteral colic and passed a kidney stone with resolution of symptoms.  He was last seen in the office in 06/2023 for preprocedure cardiac risk stratification for colonoscopy.  He denied further hematochezia.  Colonoscopy in 06/2023 with adenomatous polyps, hemorrhoids, and diverticulosis.  EGD in 11/2023 showing a small hiatal hernia and gastritis.  He comes in doing very well from a cardiac perspective and is without symptoms of angina or cardiac decompensation.  Staying very active at baseline exercising at the gym 5 to 6 days/week without cardiac limitation.  No dizziness, presyncope, or syncope.  No falls or symptoms concerning for bleeding.  No lower extremity  swelling or progressive orthopnea.  Reports he has been adherent and remains on atorvastatin  at 20 mg daily (this medication had fallen off of his list).  He does not have any acute cardiac concerns at this time.   Labs independently reviewed: 12/2023 - Hgb 16.8, PLT 252, A1c 6.4, BUN 12, serum creatinine 1.36, potassium 4.1, albumin  4.5 08/2023 - BNP 57 07/2023 - TSH normal 05/2023 - AST/ALT  normal 03/2023 - TC 147, TG 219, HDL 35, LDL 76  Past Medical History:  Diagnosis Date   Abdominal mass    Abdominal pain 06/02/2017   Anxiety    Pt. denies   Arthritis    pt. 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   left above alar crease sidewall of nose/in situ    Past Surgical History:  Procedure Laterality Date   ANTERIOR LAT LUMBAR FUSION Right 07/30/2016   Procedure: Right Lateral two-three Lateral transpsoas interbody fusion with lateral plating/Minimally invasive decompression at Lumbar two-three;  Surgeon: Ditty, Morene Hicks, MD;  Location: [facility] OR;  Service: Neurosurgery;  Laterality: Right;   BACK SURGERY  2012   CATARACT EXTRACTION, BILATERAL     CHOLECYSTECTOMY N/A 07/25/2017   Procedure: LAPAROSCOPIC CHOLECYSTECTOMY WITH INTRAOPERATIVE CHOLANGIOGRAM;  Surgeon: Gladis Cough, MD;  Location: [facility] ORS;  Service: General;  Laterality: N/A;   COLONOSCOPY WITH PROPOFOL  N/A 12/12/2017   Procedure: COLONOSCOPY WITH PROPOFOL ;  Surgeon: Viktoria Lamar DASEN, MD;  Location: [facility] ENDOSCOPY;  Service: Endoscopy;  Laterality: N/A;   COLONOSCOPY WITH PROPOFOL  N/A 06/27/2023   Procedure: COLONOSCOPY WITH PROPOFOL ;  Surgeon: Maryruth Ole DASEN, MD;  Location: [facility] ENDOSCOPY;  Service: Endoscopy;  Laterality: N/A;   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   Procedure: CORONARY ARTERY BYPASS GRAFTING (CABG) TIMES FIVE, USING LEFT INTERNAL MAMMARY ARTERY AND RIGHT GREATER SAPHENOUS VEIN HARVESTED ENDOSCOPICALLY;  Surgeon: Lucas Dorise POUR, MD;  Location: [facility] OR;  Service:  Open Heart Surgery;  Laterality: N/A;   ESOPHAGOGASTRODUODENOSCOPY N/A 11/21/2023   Procedure: EGD (ESOPHAGOGASTRODUODENOSCOPY);  Surgeon: Maryruth Ole DASEN, MD;  Location: [facility] ENDOSCOPY;  Service: Endoscopy;  Laterality: N/A;   EUS N/A 07/10/2017   Procedure: UPPER ENDOSCOPIC ULTRASOUND (EUS) LINEAR;  Surgeon: Teressa Toribio SQUIBB, MD;  Location: [facility] ENDOSCOPY;  Service: Endoscopy;  Laterality: N/A;   EYE SURGERY     HEMI-MICRODISCECTOMY LUMBAR LAMINECTOMY LEVEL 1  09/2018   [facility]   IABP INSERTION N/A 09/06/2022   Procedure: IABP Insertion;  Surgeon: Jordan, Peter M, MD;  Location: [facility] INVASIVE CV LAB;  Service: Cardiovascular;  Laterality: N/A;   LEFT HEART CATH AND CORONARY ANGIOGRAPHY N/A 09/04/2022   Procedure: LEFT HEART CATH AND CORONARY ANGIOGRAPHY;  Surgeon: Mady Bruckner, MD;  Location: [facility] INVASIVE CV LAB;  Service: Cardiovascular;  Laterality: N/A;   LEFT HEART CATH AND CORONARY ANGIOGRAPHY N/A 09/06/2022   Procedure: LEFT HEART CATH AND CORONARY ANGIOGRAPHY;  Surgeon: Jordan, Peter M, MD;  Location: [facility] INVASIVE CV LAB;  Service: Cardiovascular;  Laterality: N/A;   NECK SURGERY  2000   POLYPECTOMY  06/27/2023   Procedure: POLYPECTOMY, INTESTINE;  Surgeon: Maryruth Ole DASEN, MD;  Location: [facility] ENDOSCOPY;  Service: Endoscopy;;   SPINE SURGERY  4 times   TEE WITHOUT CARDIOVERSION N/A 09/07/2022   Procedure: TRANSESOPHAGEAL ECHOCARDIOGRAM;  Surgeon: Lucas Dorise POUR, MD;  Location: [facility] OR;  Service: Open Heart Surgery;  Laterality: N/A;   TENNIS ELBOW RELEASE/NIRSCHEL PROCEDURE     TONSILLECTOMY AND ADENOIDECTOMY      Current Medications: Current Meds  Medication Sig   acetaminophen  (TYLENOL ) 325 MG tablet Take 650 mg by mouth every 6 (six) hours as needed for moderate pain or headache.   aspirin  EC 81 MG tablet Take 1 tablet (81 mg total) by mouth daily. Swallow whole.   atorvastatin  (LIPITOR) 20 MG tablet Take 20 mg by mouth daily.   ferrous sulfate 324 MG  TBEC Take 324 mg by mouth daily.   imipramine  (TOFRANIL ) 50 MG tablet Take 1 tablet (50 mg total) by mouth at bedtime.   omeprazole  (PRILOSEC) 20 MG capsule Take 20 mg by mouth 2 (two) times daily.   tamsulosin  (FLOMAX ) 0.4 MG CAPS capsule Take 1 capsule (0.4 mg total) by mouth daily after supper.   [DISCONTINUED] clopidogrel  (PLAVIX ) 75 MG tablet Take 1 tablet (75 mg total) by mouth daily.    Allergies:   Pravastatin  and Amoxicillin   Social History   Socioeconomic History   Marital status: Married    Spouse name: Not on file   Number of children: 2   Years of education: Not on file   Highest education level: 12th grade  Occupational History   Not on file  Tobacco Use   Smoking status: Never    Passive exposure: Never   Smokeless tobacco: Never  Vaping Use   Vaping status: Never Used  Substance and Sexual Activity   Alcohol use: No    Alcohol/week: 0.0 standard drinks of alcohol   Drug use: No   Sexual activity: Yes  Other Topics Concern   Not on file  Social History Narrative   Not on file   Social Drivers of Health   Financial Resource Strain: Low Risk  (07/08/2023)   Overall Financial Resource Strain (CARDIA)    Difficulty of Paying Living Expenses: Not hard at all  Food Insecurity: No Food Insecurity (07/08/2023)   Hunger Vital Sign    Worried About Running Out of Food in the Last Year: Never true    Ran Out of Food in the Last Year: Never true  Transportation Needs: No Transportation Needs (07/08/2023)   PRAPARE - Administrator, Civil Service (Medical): No    Lack of Transportation (Non-Medical): No  Physical Activity: Sufficiently Active (07/08/2023)   Exercise Vital Sign    Days of Exercise per Week: 6 days    Minutes of Exercise per Session: 60 min  Stress: No Stress Concern Present (07/08/2023)   [employer] of Occupational Health - Occupational Stress Questionnaire    Feeling of Stress : Only a little  Social Connections: Moderately Isolated  (07/08/2023)   Social Connection and Isolation Panel    Frequency of Communication with Friends and Family: Three times a week    Frequency of Social Gatherings with Friends and Family: Three times a week    Attends Religious Services: Never    Active Member of Clubs or Organizations: No    Attends Banker Meetings: Never    Marital Status: Married     Family History:  The patient's family history includes  Cancer in his mother; Diabetes in his brother and father; Heart disease in his sister; Rheumatic fever in his sister.  ROS:   12-point review of systems is negative unless otherwise noted in the HPI.   EKGs/Labs/Other Studies Reviewed:    Studies reviewed were summarized above. The additional studies were reviewed today:  2D echo 04/09/2023: 1. Left ventricular ejection fraction, by estimation, is 55 to 60%. Left  ventricular ejection fraction by PLAX is 57 %. The left ventricle has  normal function. The left ventricle has no regional wall motion  abnormalities. Left ventricular diastolic  parameters are indeterminate. The average left ventricular global  longitudinal strain is -15.7 %. The global longitudinal strain is normal.   2. Right ventricular systolic function is mildly reduced. The right  ventricular size is normal.   3. Left atrial size was mildly dilated.   4. The mitral valve is normal in structure. Mild mitral valve  regurgitation. No evidence of mitral stenosis.   5. The aortic valve is normal in structure. Aortic valve regurgitation is  not visualized. No aortic stenosis is present.   6. The inferior vena cava is normal in size with greater than 50%  respiratory variability, suggesting right atrial pressure of 3 mmHg.  __________   Zio patch 09/2022: Normal sinus rhythm Patient had a min HR of 51 bpm, max HR of 125 bpm, and avg HR of 74 bpm.   3 Supraventricular Tachycardia runs occurred, the run with the fastest interval lasting 4 beats with a max  rate of 124 bpm, the longest lasting 9 beats with an avg rate of 103 bpm.    Isolated SVEs were rare (<1.0%), SVE Couplets were rare (<1.0%), and SVE Triplets were rare (<1.0%).  Isolated VEs were rare (<1.0%), VE Couplets were rare (<1.0%), and no VE Triplets were present.   Patient triggered events associated with normal sinus rhythm. __________   Intraoperative TEE 09/07/2022: POST-OP IMPRESSIONS  Overall, there were no significant changes from pre-bypass.  _ Left Ventricle: The left ventricle is unchanged from pre-bypass.  _ Right Ventricle: The right ventricle appears unchanged from pre-bypass.  _ Aorta: The aorta appears unchanged from pre-bypass.  _ Left Atrium: The left atrium appears unchanged from pre-bypass.  _ Left Atrial Appendage: The left atrial appendage appears unchanged from  pre-bypass.  _ Aortic Valve: The aortic valve appears unchanged from pre-bypass.  _ Mitral Valve: There is mild regurgitation.  _ Tricuspid Valve: There is trace regurgitation.  _ Pulmonic Valve: The pulmonic valve appears unchanged from pre-bypass.  _ Interatrial Septum: The interatrial septum appears unchanged from  pre-bypass.  _ Interventricular Septum: The interventricular septum appears unchanged  from  pre-bypass.  _ Pericardium: The pericardium appears unchanged from pre-bypass.  __________   Pre-CABG vascular imaging 09/06/2022: Summary:  Right Carotid: Waveforms are unreliable in the presence of an IABP.  Possible area of stenosis is noted within the proximal ICA. Unable  to determine degree of stenosis due to unreliable waveforms.   Left Carotid: Waveforms are unreliable in the presence of an IABP.  Vertebrals: Bilateral vertebral arteries demonstrate antegrade flow.   Left ABI: Resting left ankle-brachial index is within normal range.  Right Upper Extremity: Doppler waveform obliterate with right radial  compression. Doppler waveforms remain within normal limits with right   ulnar compression.  Left Upper Extremity: Doppler waveforms decrease 50% with left radial  compression. Doppler waveforms remain within normal limits with left ulnar  compression.  __________   LHC 09/06/2022:  Mid LM to Ost LAD lesion is 30% stenosed with 30% stenosed side branch in Ost Cx.   Mid LAD-1 lesion is 90% stenosed.   Mid LAD-2 lesion is 90% stenosed.   Dist LAD lesion is 90% stenosed.   Mid Cx to Dist Cx lesion is 70% stenosed.   2nd Diag lesion is 70% stenosed.   2nd Mrg lesion is 100% stenosed.   RPDA lesion is 80% stenosed.   RPAV lesion is 90% stenosed.   LV end diastolic pressure is normal.   No change in severe 3 vessel obstructive CAD Normal LVEDP IABP placed for refractory angina __________   LHC 09/04/2022: Conclusions: Severe three-vessel coronary artery disease, as detailed below.  Culprit lesion for the patient's NSTEMI is most likely occluded distal segment of OM1.  There are also sequential 90% stenoses of the mid/distal LAD, 70% mid LCx lesion, and 80-90% ostial rPDA and proximal rPLA stenoses. Moderately to severely reduced left ventricular systolic function (LVEF 30-35%) with apical dyskinesis. Upper normal left ventricular filling pressure (LVEDP 15 mmHg).   Recommendations: Transfer to [facility] for cardiac surgery consultation for CABG. Restart heparin  infusion 2 hours after TR band removal.  Defer addition of P2Y12 inhibitor pending cardiac surgery consultation. Aggressive secondary prevention of coronary artery disease. Maintain net even fluid balance and escalate goal-directed medical therapy for HFrEF as tolerated. Follow-up echocardiogram.   EKG:  EKG is ordered today.  The EKG ordered today demonstrates NSR, 88 bpm bifascicular block (RBBB, LAFB), occasional isolated PVC  Recent Labs: 05/29/2023: ALT 14; BUN 20; Creatinine, Ser 1.82; Potassium 4.7; Sodium 135 07/15/2023: TSH 2.250 08/15/2023: BNP 57.6; Hemoglobin 13.2; Platelets 279   Recent Lipid Panel    Component Value Date/Time   CHOL 147 04/01/2023 1202   TRIG 219 (H) 04/01/2023 1202   HDL 35 (L) 04/01/2023 1202   CHOLHDL 4.2 04/01/2023 1202   CHOLHDL 4.6 09/04/2022 0300   VLDL 35 09/04/2022 0300   LDLCALC 76 04/01/2023 1202   LDLDIRECT 74 04/01/2023 1202    PHYSICAL EXAM:    VS:  BP 128/62 (BP Location: Left Arm, Patient Position: Sitting, Cuff Size: Normal)   Pulse 88   Ht 5' 10 (1.778 m)   Wt 196 lb 12.8 oz (89.3 kg)   SpO2 99%   BMI 28.24 kg/m   BMI: Body mass index is 28.24 kg/m.  Physical Exam Vitals reviewed.  Constitutional:      Appearance: He is well-developed.  HENT:     Head: Normocephalic and atraumatic.  Eyes:     General:        Right eye: No discharge.        Left eye: No discharge.  Cardiovascular:     Rate and Rhythm: Normal rate and regular rhythm.     Pulses:          Posterior tibial pulses are 2+ on the right side and 2+ on the left side.     Heart sounds: Normal heart sounds, S1 normal and S2 normal. Heart sounds not distant. No midsystolic click and no opening snap. No murmur heard.    No friction rub.  Pulmonary:     Effort: Pulmonary effort is normal. No respiratory distress.     Breath sounds: Normal breath sounds. No decreased breath sounds, wheezing, rhonchi or rales.  Musculoskeletal:     Cervical back: Normal range of motion.     Right lower leg: No edema.     Left lower leg: No edema.  Skin:    General: Skin is warm and dry.     Nails: There is no clubbing.  Neurological:     Mental Status: He is alert and oriented to person, place, and time.  Psychiatric:        Speech: Speech normal.        Behavior: Behavior normal.        Thought Content: Thought content normal.        Judgment: Judgment normal.     Wt Readings from Last 3 Encounters:  01/08/24 196 lb 12.8 oz (89.3 kg)  12/29/23 190 lb (86.2 kg)  12/09/23 193 lb (87.5 kg)     ASSESSMENT & PLAN:   CAD involving the native coronary  arteries with NSTEMI status post 5 vessel CABG without angina: He continues to do very well and is without symptoms concerning for angina or cardiac decompensation.  Now that it is greater than 12 months post CABG, he we will discontinue clopidogrel  and be maintained on aspirin  81 mg daily indefinitely.  Continue atorvastatin  as outlined below.  Has not been maintained on beta-blocker secondary to prior bifascicular block.  No indication for further ischemic testing at this time.  Postoperative Afib: Maintaining sinus rhythm by EKG.  Outpatient cardiac monitoring showed no evidence of recurrence.  No longer on amiodarone  or beta-blocker.  Given lack of recurrence he has not been maintained on OAC.  Should he have documented recurrence of A-fib, anticoagulation will need to be revisited at that time.  PSVT: Quiescent.  Outpatient cardiac monitoring showed no evidence of sustained arrhythmias.  Not requiring AV nodal blocking medication.  HTN: Blood pressure is well-controlled in the office today.  Wearing antihypertensive therapy.  HLD with statin intolerance: LDL 76 in 03/2023.  Tolerating atorvastatin  20 mg daily per his report (this medication had fallen off his list, was previously noted to be on 10 mg).  Refill atorvastatin .  Anticipate follow-up labs at next visit if not recently obtained.     Disposition: F/u with Dr. Gollan or an APP in 6 months.   Medication Adjustments/Labs and Tests Ordered: Current medicines are reviewed at length with the patient today.  Concerns regarding medicines are outlined above. Medication changes, Labs and Tests ordered today are summarized above and listed in the Patient Instructions accessible in Encounters.   Signed, Bernardino Bring, PA-C 01/08/2024 12:12 PM     [facility] [address] [place], [place] [address] [phone]

## 2024-01-08 ENCOUNTER — Encounter: Payer: Self-pay | Admitting: Physician Assistant

## 2024-01-08 ENCOUNTER — Ambulatory Visit: Attending: Physician Assistant | Admitting: Physician Assistant

## 2024-01-08 VITALS — BP 128/62 | HR 88 | Ht 70.0 in | Wt 196.8 lb

## 2024-01-08 DIAGNOSIS — I4891 Unspecified atrial fibrillation: Secondary | ICD-10-CM | POA: Diagnosis not present

## 2024-01-08 DIAGNOSIS — Z951 Presence of aortocoronary bypass graft: Secondary | ICD-10-CM | POA: Diagnosis not present

## 2024-01-08 DIAGNOSIS — I471 Supraventricular tachycardia, unspecified: Secondary | ICD-10-CM | POA: Diagnosis not present

## 2024-01-08 DIAGNOSIS — I2510 Atherosclerotic heart disease of native coronary artery without angina pectoris: Secondary | ICD-10-CM | POA: Diagnosis not present

## 2024-01-08 DIAGNOSIS — E785 Hyperlipidemia, unspecified: Secondary | ICD-10-CM | POA: Diagnosis not present

## 2024-01-08 DIAGNOSIS — I9789 Other postprocedural complications and disorders of the circulatory system, not elsewhere classified: Secondary | ICD-10-CM

## 2024-01-08 DIAGNOSIS — Z789 Other specified health status: Secondary | ICD-10-CM

## 2024-01-08 DIAGNOSIS — I10 Essential (primary) hypertension: Secondary | ICD-10-CM | POA: Diagnosis not present

## 2024-01-08 NOTE — Patient Instructions (Addendum)
 Medication Instructions:  Stop Plavix .  Start back on atorvastatin  (Lipitor) 20 mg one tablet daily.   *If you need a refill on your cardiac medications before your next appointment, please call your pharmacy*  Lab Work: No labs ordered today   If you have labs (blood work) drawn today and your tests are completely normal, you will receive your results only by: MyChart Message (if you have MyChart) OR A paper copy in the mail If you have any lab test that is abnormal or we need to change your treatment, we will call you to review the results.  Testing/Procedures: No test ordered today    Follow-Up: At [facility], you and your health needs are our priority.  As part of our continuing mission to provide you with exceptional heart care, our providers are all part of one team.  This team includes your primary Cardiologist (physician) and Advanced Practice Providers or APPs (Physician Assistants and Nurse Practitioners) who all work together to provide you with the care you need, when you need it.  Your next appointment:   6 month(s)  Provider:   You may see Timothy Gollan, MD or one of the following Advanced Practice Providers on your designated Care Team:   Lonni Meager, NP Lesley Maffucci, PA-C Bernardino Bring, PA-C Cadence [place], PA-C Tylene Lunch, NP Barnie Hila, NP    We recommend signing up for the patient portal called MyChart.  Sign up information is provided on this After Visit Summary.  MyChart is used to connect with patients for Virtual Visits (Telemedicine).  Patients are able to view lab/test results, encounter notes, upcoming appointments, etc.  Non-urgent messages can be sent to your provider as well.   To learn more about what you can do with MyChart, go to [email or web address].

## 2024-01-10 ENCOUNTER — Other Ambulatory Visit: Payer: Self-pay | Admitting: Physician Assistant

## 2024-01-13 ENCOUNTER — Ambulatory Visit
Admission: RE | Admit: 2024-01-13 | Discharge: 2024-01-13 | Disposition: A | Discharge status: Home / Self Care | Source: Ambulatory Visit | Attending: Pain Medicine | Admitting: Pain Medicine

## 2024-01-13 ENCOUNTER — Encounter: Payer: Self-pay | Admitting: Pain Medicine

## 2024-01-13 ENCOUNTER — Ambulatory Visit (HOSPITAL_BASED_OUTPATIENT_CLINIC_OR_DEPARTMENT_OTHER): Admitting: Pain Medicine

## 2024-01-13 VITALS — BP 138/82 | HR 79 | Temp 97.6°F | Resp 16 | Ht 70.0 in | Wt 190.0 lb

## 2024-01-13 DIAGNOSIS — M47816 Spondylosis without myelopathy or radiculopathy, lumbar region: Secondary | ICD-10-CM | POA: Diagnosis present

## 2024-01-13 DIAGNOSIS — G8929 Other chronic pain: Secondary | ICD-10-CM | POA: Insufficient documentation

## 2024-01-13 DIAGNOSIS — G8918 Other acute postprocedural pain: Secondary | ICD-10-CM | POA: Diagnosis present

## 2024-01-13 DIAGNOSIS — M545 Low back pain, unspecified: Secondary | ICD-10-CM

## 2024-01-13 DIAGNOSIS — M961 Postlaminectomy syndrome, not elsewhere classified: Secondary | ICD-10-CM

## 2024-01-13 DIAGNOSIS — Z7901 Long term (current) use of anticoagulants: Secondary | ICD-10-CM | POA: Diagnosis present

## 2024-01-13 DIAGNOSIS — M5459 Other low back pain: Secondary | ICD-10-CM | POA: Diagnosis present

## 2024-01-13 MED ORDER — FENTANYL CITRATE (PF) 100 MCG/2ML IJ SOLN
INTRAMUSCULAR | Status: AC
  Filled 2024-01-13: qty 2

## 2024-01-13 MED ORDER — TRIAMCINOLONE ACETONIDE 40 MG/ML IJ SUSP
40.0000 mg | Freq: Once | INTRAMUSCULAR | Status: AC
  Administered 2024-01-13: 40 mg

## 2024-01-13 MED ORDER — FENTANYL CITRATE (PF) 100 MCG/2ML IJ SOLN: 25.0000 ug | INTRAMUSCULAR | Status: DC | PRN

## 2024-01-13 MED ORDER — HYDROCODONE-ACETAMINOPHEN 5-325 MG PO TABS: 1.0000 | ORAL_TABLET | Freq: Four times a day (QID) | ORAL | 0 refills | Status: AC | PRN

## 2024-01-13 MED ORDER — LIDOCAINE HCL 2 % IJ SOLN
20.0000 mL | Freq: Once | INTRAMUSCULAR | Status: AC
  Administered 2024-01-13: 400 mg

## 2024-01-13 MED ORDER — ROPIVACAINE HCL 2 MG/ML IJ SOLN
INTRAMUSCULAR | Status: AC
  Filled 2024-01-13: qty 20

## 2024-01-13 MED ORDER — HYDROCODONE-ACETAMINOPHEN 5-325 MG PO TABS: 1.0000 | ORAL_TABLET | Freq: Four times a day (QID) | ORAL | 0 refills | Status: DC | PRN

## 2024-01-13 MED ORDER — ROPIVACAINE HCL 2 MG/ML IJ SOLN
9.0000 mL | Freq: Once | INTRAMUSCULAR | Status: AC
  Administered 2024-01-13: 9 mL via PERINEURAL

## 2024-01-13 MED ORDER — MIDAZOLAM HCL 5 MG/5ML IJ SOLN
INTRAMUSCULAR | Status: AC
  Filled 2024-01-13: qty 5

## 2024-01-13 MED ORDER — LIDOCAINE HCL 2 % IJ SOLN
INTRAMUSCULAR | Status: AC
  Filled 2024-01-13: qty 20

## 2024-01-13 MED ORDER — MIDAZOLAM HCL 5 MG/5ML IJ SOLN
0.5000 mg | Freq: Once | INTRAMUSCULAR | Status: AC
  Administered 2024-01-13: 2 mg via INTRAVENOUS

## 2024-01-13 MED ORDER — PENTAFLUOROPROP-TETRAFLUOROETH EX AERO
INHALATION_SPRAY | Freq: Once | CUTANEOUS | Status: AC
  Administered 2024-01-13: 30 via TOPICAL

## 2024-01-13 MED ORDER — TRIAMCINOLONE ACETONIDE 40 MG/ML IJ SUSP
INTRAMUSCULAR | Status: AC
  Filled 2024-01-13: qty 1

## 2024-01-13 NOTE — Progress Notes (Signed)
 PROVIDER NOTE: Interpretation of information contained herein should be left to medically-trained personnel. Specific patient instructions are provided elsewhere under Patient Instructions section of medical record. This document was created in part using STT-dictation technology, any transcriptional errors that may result from this process are unintentional.  Patient: Rodney Hall. Type: Established DOB: [birth year] MRN: [id number] PCP: Gasper Nancyann BRAVO, MD  Service: Procedure DOS: 01/13/2024 Setting: Ambulatory Location: Ambulatory outpatient facility Delivery: Face-to-face Provider: Eric DELENA Como, MD Specialty: Interventional Pain Management Specialty designation: 09 Location: Outpatient facility Ref. Prov.: Como Eric, MD       Interventional Therapy   Procedure: Lumbar Facet, Medial Branch Radiofrequency Ablation (RFA) #1  Laterality: Right (-RT)  Level: L2, L3, L4, L5, and S1 Medial Branch Level(s). These levels will denervate the L3-4, L4-5, and L5-S1 lumbar facet joints.  Imaging: Fluoroscopy-guided Spinal ([id number]) Anesthesia: Local anesthesia (1-2% Lidocaine ) Anxiolysis: IV Versed  2.0 mg            Sedation: Minimal Sedation None required. No Fentanyl  administered.         DOS: 01/13/2024  Performed by: Eric DELENA Como, MD  Purpose: Therapeutic/Palliative Indications: Low back pain severe enough to impact quality of life or function. Indications: 1. 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 Low back pain of over 3 months duration   3. Lumbar facet joint pain   4. Lumbar facet hypertrophy (Multilevel) (Bilateral)   5. Lumbar facet joint syndrome   6. Lumbar post-laminectomy syndrome    Mr. Parmer has been dealing with the above chronic pain for longer than three months and has either failed to respond, was unable to tolerate, or simply did not get enough benefit from other more conservative therapies including, but not  limited to: 1. Over-the-counter medications 2. Anti-inflammatory medications 3. Muscle relaxants 4. Membrane stabilizers 5. Opioids 6. Physical therapy and/or chiropractic manipulation 7. Modalities (Heat, ice, etc.) 8. Invasive techniques such as nerve blocks. Mr. Risinger has attained more than 50% relief of the pain from a series of diagnostic injections conducted in separate occasions.  Pain Score: Pre-procedure: 3 /10 Post-procedure: 0-No pain/10     Position / Prep / Materials:  Position: Prone  Prep solution: ChloraPrep (2% chlorhexidine  gluconate and 70% isopropyl alcohol) Prep Area: Entire Lumbosacral Region (Lower back from mid-thoracic region to end of tailbone and from flank to flank.) Materials:  Tray: RFA (Radiofrequency) tray Needle(s):  Type: RFA (Teflon-coated radiofrequency ablation needles) Gauge (G): 22  Length: Regular (10cm) Qty: 5     H&P (Pre-op Assessment):  Mr. Tillis is a 78 y.o. (year old), male patient, seen today for interventional treatment. He  has a past surgical history that includes Back surgery (2012); Neck surgery (2000); Cataract extraction, bilateral; Tonsillectomy and adenoidectomy; Anterior lat lumbar fusion (Right, 07/30/2016); EUS (N/A, 07/10/2017); Tennis elbow release/nirschel procedure; Eye surgery; Cholecystectomy (N/A, 07/25/2017); Colonoscopy with propofol  (N/A, 12/12/2017); Hemi-microdiscectomy lumbar laminectomy level 1 (09/2018); LEFT HEART CATH AND CORONARY ANGIOGRAPHY (N/A, 09/04/2022); Coronary artery bypass graft (N/A, 09/07/2022); TEE without cardioversion (N/A, 09/07/2022); LEFT HEART CATH AND CORONARY ANGIOGRAPHY (N/A, 09/06/2022); IABP Insertion (N/A, 09/06/2022); Colonoscopy with propofol  (N/A, 06/27/2023); Polypectomy (06/27/2023); Spine surgery (4 times); and Esophagogastroduodenoscopy (N/A, 11/21/2023). Mr. Wahlert has a current medication list which includes the following prescription(s): acetaminophen , aspirin  ec,  atorvastatin , ferrous sulfate, hydrocodone -acetaminophen , [START ON 01/20/2024] hydrocodone -acetaminophen , imipramine , omeprazole , tamsulosin , and terbinafine , and the following Facility-Administered Medications: fentanyl . His primarily concern today is the Back Pain  Initial Vital Signs:  Pulse/HCG Rate: 91ECG Heart Rate: 86 (nsr) Temp: 97.6 F (36.4 C) Resp: 18 BP: 136/80 SpO2: 95 %  BMI: Estimated body mass index is 27.26 kg/m as calculated from the following:   Height as of this encounter: 5' 10 (1.778 m).   Weight as of this encounter: 190 lb (86.2 kg).  Risk Assessment: Allergies: Reviewed. He is allergic to pravastatin  and amoxicillin.  Allergy Precautions: None required Coagulopathies: Reviewed. None identified.  Blood-thinner therapy: None at this time Active Infection(s): Reviewed. None identified. Mr. Bua is afebrile  Site Confirmation: Mr. Negro was asked to confirm the procedure and laterality before marking the site Procedure checklist: Completed Consent: Before the procedure and under the influence of no sedative(s), amnesic(s), or anxiolytics, the patient was informed of the treatment options, risks and possible complications. To fulfill our ethical and legal obligations, as recommended by the American Medical Association's Code of Ethics, I have informed the patient of my clinical impression; the nature and purpose of the treatment or procedure; the risks, benefits, and possible complications of the intervention; the alternatives, including doing nothing; the risk(s) and benefit(s) of the alternative treatment(s) or procedure(s); and the risk(s) and benefit(s) of doing nothing. The patient was provided information about the general risks and possible complications associated with the procedure. These may include, but are not limited to: failure to achieve desired goals, infection, bleeding, organ or nerve damage, allergic reactions, paralysis, and death. In addition,  the patient was informed of those risks and complications associated to Spine-related procedures, such as failure to decrease pain; infection (i.e.: Meningitis, epidural or intraspinal abscess); bleeding (i.e.: epidural hematoma, subarachnoid hemorrhage, or any other type of intraspinal or peri-dural bleeding); organ or nerve damage (i.e.: Any type of peripheral nerve, nerve root, or spinal cord injury) with subsequent damage to sensory, motor, and/or autonomic systems, resulting in permanent pain, numbness, and/or weakness of one or several areas of the body; allergic reactions; (i.e.: anaphylactic reaction); and/or death. Furthermore, the patient was informed of those risks and complications associated with the medications. These include, but are not limited to: allergic reactions (i.e.: anaphylactic or anaphylactoid reaction(s)); adrenal axis suppression; blood sugar elevation that in diabetics may result in ketoacidosis or comma; water  retention that in patients with history of congestive heart failure may result in shortness of breath, pulmonary edema, and decompensation with resultant heart failure; weight gain; swelling or edema; medication-induced neural toxicity; particulate matter embolism and blood vessel occlusion with resultant organ, and/or nervous system infarction; and/or aseptic necrosis of one or more joints. Finally, the patient was informed that Medicine is not an exact science; therefore, there is also the possibility of unforeseen or unpredictable risks and/or possible complications that may result in a catastrophic outcome. The patient indicated having understood very clearly. We have given the patient no guarantees and we have made no promises. Enough time was given to the patient to ask questions, all of which were answered to the patient's satisfaction. Mr. Goto has indicated that he wanted to continue with the procedure. Attestation: I, the ordering provider, attest that I have  discussed with the patient the benefits, risks, side-effects, alternatives, likelihood of achieving goals, and potential problems during recovery for the procedure that I have provided informed consent. Date  Time: 01/13/2024  9:28 AM  Pre-Procedure Preparation:  Monitoring: As per clinic protocol. Respiration, ETCO2, SpO2, BP, heart rate and rhythm monitor placed and checked for adequate function Safety Precautions: Patient was assessed for positional comfort and pressure points before starting the  procedure. Time-out: I initiated and conducted the Time-out before starting the procedure, as per protocol. The patient was asked to participate by confirming the accuracy of the Time Out information. Verification of the correct person, site, and procedure were performed and confirmed by me, the nursing staff, and the patient. Time-out conducted as per Joint Commission's Universal Protocol (UP.01.01.01). Time: 1136 Start Time: 1136 hrs.  Description of Procedure:          Laterality: See above. Levels:  See above. Safety Precautions: Aspiration looking for blood return was conducted prior to all injections. At no point did we inject any substances, as a needle was being advanced. Before injecting, the patient was told to immediately notify me if he was experiencing any new onset of ringing in the ears, or metallic taste in the mouth. No attempts were made at seeking any paresthesias. Safe injection practices and needle disposal techniques used. Medications properly checked for expiration dates. SDV (single dose vial) medications used. After the completion of the procedure, all disposable equipment used was discarded in the proper designated medical waste containers. Local Anesthesia: Protocol guidelines were followed. The patient was positioned over the fluoroscopy table. The area was prepped in the usual manner. The time-out was completed. The target area was identified using fluoroscopy. A 12-in  long, straight, sterile hemostat was used with fluoroscopic guidance to locate the targets for each level blocked. Once located, the skin was marked with an approved surgical skin marker. Once all sites were marked, the skin (epidermis, dermis, and hypodermis), as well as deeper tissues (fat, connective tissue and muscle) were infiltrated with a small amount of a short-acting local anesthetic, loaded on a 10cc syringe with a 25G, 1.5-in  Needle. An appropriate amount of time was allowed for local anesthetics to take effect before proceeding to the next step. Technical description of process:  Radiofrequency Ablation (RFA) L2 Medial Branch Nerve RFA: The target area for the L2 medial branch is at the junction of the postero-lateral aspect of the superior articular process and the superior, posterior, and medial edge of the transverse process of L3. Under fluoroscopic guidance, a Radiofrequency needle was inserted until contact was made with os over the superior postero-lateral aspect of the pedicular shadow (target area). Sensory and motor testing was conducted to properly adjust the position of the needle. Once satisfactory placement of the needle was achieved, the numbing solution was slowly injected after negative aspiration for blood. 2.0 mL of the nerve block solution was injected without difficulty or complication. After waiting for at least 3 minutes, the ablation was performed. Once completed, the needle was removed intact. L3 Medial Branch Nerve RFA: The target area for the L3 medial branch is at the junction of the postero-lateral aspect of the superior articular process and the superior, posterior, and medial edge of the transverse process of L4. Under fluoroscopic guidance, a Radiofrequency needle was inserted until contact was made with os over the superior postero-lateral aspect of the pedicular shadow (target area). Sensory and motor testing was conducted to properly adjust the position of the  needle. Once satisfactory placement of the needle was achieved, the numbing solution was slowly injected after negative aspiration for blood. 2.0 mL of the nerve block solution was injected without difficulty or complication. After waiting for at least 3 minutes, the ablation was performed. Once completed, the needle was removed intact. L4 Medial Branch Nerve RFA: The target area for the L4 medial branch is at the junction of the postero-lateral aspect  of the superior articular process and the superior, posterior, and medial edge of the transverse process of L5. Under fluoroscopic guidance, a Radiofrequency needle was inserted until contact was made with os over the superior postero-lateral aspect of the pedicular shadow (target area). Sensory and motor testing was conducted to properly adjust the position of the needle. Once satisfactory placement of the needle was achieved, the numbing solution was slowly injected after negative aspiration for blood. 2.0 mL of the nerve block solution was injected without difficulty or complication. After waiting for at least 3 minutes, the ablation was performed. Once completed, the needle was removed intact. L5 Medial Branch Nerve RFA: The target area for the L5 medial branch is at the junction of the postero-lateral aspect of the superior articular process of S1 and the superior, posterior, and medial edge of the sacral ala. Under fluoroscopic guidance, a Radiofrequency needle was inserted until contact was made with os over the superior postero-lateral aspect of the pedicular shadow (target area). Sensory and motor testing was conducted to properly adjust the position of the needle. Once satisfactory placement of the needle was achieved, the numbing solution was slowly injected after negative aspiration for blood. 2.0 mL of the nerve block solution was injected without difficulty or complication. After waiting for at least 3 minutes, the ablation was performed. Once  completed, the needle was removed intact. S1 Medial Branch Nerve RFA: The target area for the S1 medial branch is located inferior to the junction of the S1 superior articular process and the L5 inferior articular process, posterior, inferior, and lateral to the 6 o'clock position of the L5-S1 facet joint, just superior to the S1 posterior foramen. Under fluoroscopic guidance, the Radiofrequency needle was advanced until contact was made with os over the Target area. Sensory and motor testing was conducted to properly adjust the position of the needle. Once satisfactory placement of the needle was achieved, the numbing solution was slowly injected after negative aspiration for blood. 2.0 mL of the nerve block solution was injected without difficulty or complication. After waiting for at least 3 minutes, the ablation was performed. Once completed, the needle was removed intact. Radiofrequency lesioning (ablation):  Radiofrequency Generator: Medtronic AccurianTM AG 1000 RF Generator Sensory Stimulation Parameters: 50 Hz was used to locate & identify the nerve, making sure that the needle was positioned such that there was no sensory stimulation below 0.3 V or above 0.7 V. Motor Stimulation Parameters: 2 Hz was used to evaluate the motor component. Care was taken not to lesion any nerves that demonstrated motor stimulation of the lower extremities at an output of less than 2.5 times that of the sensory threshold, or a maximum of 2.0 V. Lesioning Technique Parameters: Standard Radiofrequency settings. (Not bipolar or pulsed.) Temperature Settings: 80 degrees C Lesioning time: 60 seconds Stationary intra-operative compliance: Compliant  Once the entire procedure was completed, the treated area was cleaned, making sure to leave some of the prepping solution back to take advantage of its long term bactericidal properties.    Illustration of the posterior view of the lumbar spine and the posterior neural  structures. Laminae of L2 through S1 are labeled. DPRL5, dorsal primary ramus of L5; DPRS1, dorsal primary ramus of S1; DPR3, dorsal primary ramus of L3; FJ, facet (zygapophyseal) joint L3-L4; I, inferior articular process of L4; LB1, lateral branch of dorsal primary ramus of L1; IAB, inferior articular branches from L3 medial branch (supplies L4-L5 facet joint); IBP, intermediate branch plexus; MB3, medial branch of  dorsal primary ramus of L3; NR3, third lumbar nerve root; S, superior articular process of L5; SAB, superior articular branches from L4 (supplies L4-5 facet joint also); TP3, transverse process of L3.  Facet Joint Innervation (* possible contribution)  L1-2 T12, L1 (L2*)  Medial Branch  L2-3 L1, L2 (L3*)                     L3-4 L2, L3 (L4*)                     L4-5 L3, L4 (L5*)                     L5-S1 L4, L5, S1                        Vitals:   01/13/24 1155 01/13/24 1200 01/13/24 1204 01/13/24 1213  BP: (!) 152/106 (!) 152/101  138/82  Pulse:    79  Resp: 17 18 15 16   Temp:      SpO2: 100% 100% 100% 100%  Weight:      Height:        Start Time: 1136 hrs. End Time: 1203 hrs.  Imaging Guidance (Spinal):         Type of Imaging Technique: Fluoroscopy Guidance (Spinal) Indication(s): Fluoroscopy guidance for needle placement to enhance accuracy in procedures requiring precise needle localization for targeted delivery of medication in or near specific anatomical locations not easily accessible without such real-time imaging assistance. Exposure Time: Please see nurses notes. Contrast: None used. Fluoroscopic Guidance: I was personally present during the use of fluoroscopy. Tunnel Vision Technique used to obtain the best possible view of the target area. Parallax error corrected before commencing the procedure. Direction-depth-direction technique used to introduce the needle under continuous pulsed fluoroscopy. Once target was reached, antero-posterior, oblique,  and lateral fluoroscopic projection used confirm needle placement in all planes. Images permanently stored in EMR. Interpretation: No contrast injected. I personally interpreted the imaging intraoperatively. Adequate needle placement confirmed in multiple planes. Permanent images saved into the patient's record.  Antibiotic Prophylaxis:   Anti-infectives (From admission, onward)    None      Indication(s): None identified  Post-operative Assessment:  Post-procedure Vital Signs:  Pulse/HCG Rate: 7991 Temp: 97.6 F (36.4 C) Resp: 16 BP: 138/82 SpO2: 100 %  EBL: None  Complications: No immediate post-treatment complications observed by team, or reported by patient.  Note: The patient tolerated the entire procedure well. A repeat set of vitals were taken after the procedure and the patient was kept under observation following institutional policy, for this type of procedure. Post-procedural neurological assessment was performed, showing return to baseline, prior to discharge. The patient was provided with post-procedure discharge instructions, including a section on how to identify potential problems. Should any problems arise concerning this procedure, the patient was given instructions to immediately contact us , at any time, without hesitation. In any case, we plan to contact the patient by telephone for a follow-up status report regarding this interventional procedure.  Comments:  No additional relevant information.  Plan of Care (POC)  Orders:  Orders Placed This Encounter  Procedures   Radiofrequency,Lumbar    Scheduling Instructions:     Side(s): Right-sided     Level: L3-4, L4-5, and L5-S1 Facets (L2, L3, L4, L5, and S1 Medial Branch)     Sedation: With Sedation.     Date: 01/13/2024    Where will  this procedure be performed?:   [facility]   Radiofrequency,Lumbar    Standing Status:   Future    Expiration Date:   04/14/2024    Scheduling Instructions:      Side(s): Left-sided     Level: L3-4, L4-5, and L5-S1 Facets (L2, L3, L4, L5, and S1 Medial Branch)     Sedation: With Sedation.     Timeframe: 2 weeks from now.    Where will this procedure be performed?:   [facility]   DG PAIN [facility] C-ARM 1-60 MIN NO REPORT    Intraoperative interpretation by procedural physician at [facility].    Standing Status:   Standing    Number of Occurrences:   1    Reason for exam::   Assistance in needle guidance and placement for procedures requiring needle placement in or near specific anatomical locations not easily accessible without such assistance.   Informed Consent Details: Physician/Practitioner Attestation; Transcribe to consent form and obtain patient signature    Nursing Order: Transcribe to consent form and obtain patient signature. Note: Always confirm laterality of pain with Mr. Senner, before procedure.    Physician/Practitioner attestation of informed consent for procedure/surgical case:   I, the physician/practitioner, attest that I have discussed with the patient the benefits, risks, side effects, alternatives, likelihood of achieving goals and potential problems during recovery for the procedure that I have provided informed consent.    Procedure:   Lumbar Facet Radiofrequency Ablation    Physician/Practitioner performing the procedure:   Keva Darty A. Tanya, MD    Indication/Reason:   Low Back Pain, with our without leg pain, due to Facet Joint Arthralgia (Joint Pain) known as Lumbar Facet Syndrome, secondary to Lumbar, and/or Lumbosacral Spondylosis (Arthritis of the Spine), without myelopathy or radiculopathy (Nerve Damage).   Care order/instruction: Please confirm that the patient has stopped the Plavix  (Clopidogrel ) x 7-10 days prior to procedure or surgery.    Please confirm that the patient has stopped the Plavix  (Clopidogrel ) x 7-10 days prior to procedure or surgery.    Standing Status:   Standing    Number of  Occurrences:   1   Provide equipment / supplies at bedside    Procedure tray: Radiofrequency Tray Additional material: Large hemostat (x1); Small hemostat (x1); Towels (x8); 4x4 sterile sponge pack (x1) Needle type: Teflon-coated Radiofrequency Needle (Disposable  single use) Size: Regular Quantity: 5    Standing Status:   Standing    Number of Occurrences:   1    Specify:   Radiofrequency Tray   Saline lock IV    Have LR [phone] available and administer at 125 mL/hr if patient becomes hypotensive.    Standing Status:   Standing    Number of Occurrences:   1     Opioid Analgesic(s): No chronic opioid analgesics therapy prescribed by our practice. None. MME/day: 0 mg/day    Medications ordered for procedure: Meds ordered this encounter  Medications   lidocaine  (XYLOCAINE ) 2 % (with pres) injection 400 mg   pentafluoroprop-tetrafluoroeth (GEBAUERS) aerosol   midazolam  (VERSED ) 5 MG/5ML injection 0.5-2 mg    Make sure Flumazenil is available in the pyxis when using this medication. If oversedation occurs, administer 0.2 mg IV over 15 sec. If after 45 sec no response, administer 0.2 mg again over 1 min; may repeat at 1 min intervals; not to exceed 4 doses (1 mg)   fentaNYL  (SUBLIMAZE ) injection 25-50 mcg    Make sure Narcan is  available in the pyxis when using this medication. In the event of respiratory depression (RR< 8/min): Titrate NARCAN (naloxone) in increments of 0.1 to 0.2 mg IV at 2-3 minute intervals, until desired degree of reversal.   ropivacaine  (PF) 2 mg/mL (0.2%) (NAROPIN ) injection 9 mL   triamcinolone  acetonide (KENALOG -40) injection 40 mg   HYDROcodone -acetaminophen  (NORCO/VICODIN) 5-325 MG tablet    Sig: Take 1 tablet by mouth every 6 (six) hours as needed for up to 7 days for severe pain (pain score 7-10). Must last 7 days.    Dispense:  28 tablet    Refill:  0    For acute post-operative pain. Not to be refilled. Must last 7 days.    HYDROcodone -acetaminophen  (NORCO/VICODIN) 5-325 MG tablet    Sig: Take 1 tablet by mouth every 6 (six) hours as needed for up to 7 days for severe pain (pain score 7-10). Must last 7 days.    Dispense:  28 tablet    Refill:  0    For acute post-operative pain. Not to be refilled.  Must last 7 days.   Medications administered: We administered lidocaine , pentafluoroprop-tetrafluoroeth, midazolam , ropivacaine  (PF) 2 mg/mL (0.2%), and triamcinolone  acetonide.  See the medical record for exact dosing, route, and time of administration.    Interventional Therapies  Risk Factors  Considerations  Medical Comorbidities:  Plavix  D/C'ed  CAD  Hx. NSTEMI  S/P CABG x5  Stage 3b CKD  T2NIDDM        Planned  Pending:   Diagnostic bilateral lumbar facet RFA (Starting with the right)    Under consideration:   Diagnostic bilateral lumbar facet MBB #1  Possible Racz procedure #2    Completed:   Diagnostic bilateral lumbar facet MBB x2 (08/28/2023)  Therapeutic left Racz procedure there x1 (06/12/2023) (100/100/80/80)  Diagnostic/therapeutic left caudal ESI + diagnostic epidurogram x1 (05/08/2023) (100/100/50/50)    Therapeutic  Palliative (PRN) options:   None established   Completed by other providers:   Surgery: Right L2-3 lateral transpsoas interbody fusion w/ lateral plating and L2-3 decompression (07/30/2016) by Morene DOROTHA Stammer, MD (Neurosurgery)  Surgery: Left L3-4 and L4-5 anterolateral fusion with interbody spacer and right L3-4 and L4-5 pedicle screws (03/13/2010) by Darina Boehringer, MD (Neurosurgery)  [facility] Procedures Date Name LateralityProvider Name and Address 12/03/2022 G-RDR SNRB                                             Charlie Dolores, MD 01/17/2022 G-RDR SNRB 2 Level                                          Charlie Dolores, MD 07/03/2021 G-RDR SNRB 2 Level                               Manuelita Shoulder 05/01/2021 G-JCB Trigger Point                                  Reyes Billing, MD 12/04/2020 G-RDR SNRB 2 Level                      Charlie Dolores, MD 07/18/2020 G-RDR  SNRB                                              Charlie Dolores, MD 01/17/2020 [email or web address] Lumbar TFESIK U1 Level  Charleen Martins, MD 09/25/2018 Lumbar Laminectomy    09/20/2018 LAMINOTOMY (HEMILAMINECTOMY), DECOMPRESSION OF NERVE ROOTS, PARTIAL FACECTOTOMY, FORAMINOTOMY AND/OR DISC REMOVAL, LUMBAR (SURG) Shanda Lenis 07/30/2016 Lumbar Laminectomy   April Webb 03/07/2011 Lumbar Laminectomy   April Webb 03/06/2001 Lumbar Laminectomy       Follow-up plan:   Return in about 2 weeks (around 01/27/2024) for (ECT):(L) L-FCT RFA #1, (Blood Thinner Protocol).     Recent Visits Date Type Provider Dept  12/29/23 Office Visit Tanya Glisson, MD [facility]  Showing recent visits within past 90 days and meeting all other requirements Today's Visits Date Type Provider Dept  01/13/24 Procedure visit Tanya Glisson, MD [facility]  Showing today's visits and meeting all other requirements Future Appointments Date Type Provider Dept  01/27/24 Appointment Tanya Glisson, MD [facility]  Showing future appointments within next 90 days and meeting all other requirements   Disposition: Discharge home  Discharge (Date  Time): 01/13/2024; 1220 hrs.   Primary Care Physician: Gasper Nancyann BRAVO, MD Location: [facility] Note by: Glisson DELENA Tanya, MD (TTS technology used. I apologize for any typographical errors that were not detected and corrected.) Date: 01/13/2024; Time: 1:08 PM  Disclaimer:  Medicine is not an visual merchandiser. The only guarantee in medicine is that nothing is guaranteed. It is important to note that the decision to proceed with this intervention was based on the information collected from the patient. The Data and conclusions were drawn from the patient's questionnaire, the interview, and the physical  examination. Because the information was provided in large part by the patient, it cannot be guaranteed that it has not been purposely or unconsciously manipulated. Every effort has been made to obtain as much relevant data as possible for this evaluation. It is important to note that the conclusions that lead to this procedure are derived in large part from the available data. Always take into account that the treatment will also be dependent on availability of resources and existing treatment guidelines, considered by other Pain Management Practitioners as being common knowledge and practice, at the time of the intervention. For Medico-Legal purposes, it is also important to point out that variation in procedural techniques and pharmacological choices are the acceptable norm. The indications, contraindications, technique, and results of the above procedure should only be interpreted and judged by a Board-Certified Interventional Pain Specialist with extensive familiarity and expertise in the same exact procedure and technique.

## 2024-01-13 NOTE — Progress Notes (Signed)
 Safety precautions to be maintained throughout the outpatient stay will include: orient to surroundings, keep bed in low position, maintain call bell within reach at all times, provide assistance with transfer out of bed and ambulation.

## 2024-01-13 NOTE — Patient Instructions (Addendum)
 ______________________________________________________________________    Post-Radiofrequency (RF) Discharge Instructions  You have just completed a Radiofrequency Neurotomy.  The following instructions will provide you with information and guidelines for self-care upon discharge.  If at any time you have questions or concerns please call your physician. DO NOT DRIVE YOURSELF!!  Instructions: Apply ice: Fill a plastic sandwich bag with crushed ice. Cover it with a small towel and apply to injection site. Apply for 15 minutes then remove x 15 minutes. Repeat sequence on day of procedure, until you go to bed. The purpose is to minimize swelling and discomfort after procedure. Apply heat: Apply heat to procedure site starting the day following the procedure. The purpose is to treat any soreness and discomfort from the procedure. Food intake: No eating limitations, unless stipulated above.  Nevertheless, if you have had sedation, you may experience some nausea.  In this case, it may be wise to wait at least two hours prior to resuming regular diet. Physical activities: Keep activities to a minimum for the first 8 hours after the procedure. For the first 24 hours after the procedure, do not drive a motor vehicle,  Operate heavy machinery, power tools, or handle any weapons.  Consider walking with the use of an assistive device or accompanied by an adult for the first 24 hours.  Do not drink alcoholic beverages including beer.  Do not make any important decisions or sign any legal documents. Go home and rest today.  Resume activities tomorrow, as tolerated.  Use caution in moving about as you may experience mild leg weakness.  Use caution in cooking, use of household electrical appliances and climbing steps. Driving: If you have received any sedation, you are not allowed to drive for 24 hours after your procedure. Blood thinner: Restart your blood thinner 6 hours after your procedure. (Only for those taking  blood thinners) Insulin: As soon as you can eat, you may resume your normal dosing schedule. (Only for those taking insulin) Medications: May resume pre-procedure medications.  Do not take any drugs, other than what has been prescribed to you. Infection prevention: Keep procedure site clean and dry. Post-procedure Pain Diary: Extremely important that this be done correctly and accurately. Recorded information will be used to determine the next step in treatment. Pain evaluated is that of treated area only. Do not include pain from an untreated area. Complete every hour, on the hour, for the initial 8 hours. Set an alarm to help you do this part accurately. Do not go to sleep and have it completed later. It will not be accurate. Follow-up appointment: Keep your follow-up appointment after the procedure. Usually 2-6 weeks after radiofrequency. Bring you pain diary. The information collected will be essential for your long-term care.   Expect: From numbing medicine (AKA: Local Anesthetics): Numbness or decrease in pain. Onset: Full effect within 15 minutes of injected. Duration: It will depend on the type of local anesthetic used. On the average, 1 to 8 hours.  From steroids (when added): Decrease in swelling or inflammation. Once inflammation is improved, relief of the pain will follow. Onset of benefits: Depends on the amount of swelling present. The more swelling, the longer it will take for the benefits to be seen. In some cases, up to 10 days. Duration: Steroids will stay in the system x 2 weeks. Duration of benefits will depend on multiple posibilities including persistent irritating factors. From procedure: Some discomfort is to be expected once the numbing medicine wears off. In the case of  radiofrequency procedures, this may last as long as 6-8 weeks. Additional post-procedure pain medication is provided for this. Discomfort is minimized if ice and heat are applied as instructed.  Call  if: You experience numbness and weakness that gets worse with time, as opposed to wearing off. He experience any unusual bleeding, difficulty breathing, or loss of the ability to control your bowel and bladder. (This applies to Spinal procedures only) You experience any redness, swelling, heat, red streaks, elevated temperature, fever, or any other signs of a possible infection.  Emergency Numbers: Durning business hours (Monday - Thursday, 8:00 AM - 4:00 PM) (Friday, 9:00 AM - 12:00 Noon): [phone] After hours: [phone] ______________________________________________________________________     ______________________________________________________________________    Procedure instructions  Stop blood-thinners  Do not eat or drink fluids (other than water) for 6 hours before your procedure  No water for 2 hours before your procedure  Take your blood pressure medicine with a sip of water  Arrive 30 minutes before your appointment  If sedation is planned, bring suitable driver. Nada, [place], & public transportation are NOT APPROVED)  Carefully read the Preparing for your procedure detailed instructions  If you have questions call us  at [phone]  Procedure appointments are for procedures only.   NO medication refills or new problem evaluations will be done on procedure days.   Only the scheduled, pre-approved procedure and side will be done.   ______________________________________________________________________     ______________________________________________________________________    Preparing for your procedure  Appointments: If you think you may not be able to keep your appointment, call 24-48 hours in advance to cancel. We need time to make it available to others.  Procedure visits are for procedures only. During your procedure appointment there will be: NO Prescription Refills*. NO medication changes or discussions*. NO discussion of  disability issues*. NO unrelated pain problem evaluations*. NO evaluations to order other pain procedures*. *These will be addressed at a separate and distinct evaluation encounter on the provider's evaluation schedule and not during procedure days.  Instructions: Food intake: Avoid eating anything solid for at least 8 hours prior to your procedure. Clear liquid intake: You may take clear liquids such as water up to 2 hours prior to your procedure. (No carbonated drinks. No soda.) Transportation: Unless otherwise stated by your physician, bring a driver. (Driver cannot be a Market researcher, Pharmacist, community, or any other form of public transportation.) Morning Medicines: Except for blood thinners, take all of your other morning medications with a sip of water. Make sure to take your heart and blood pressure medicines. If your blood pressure's lower number is above 100, the case will be rescheduled. Blood thinners: Make sure to stop your blood thinners as instructed.  If you take a blood thinner, but were not instructed to stop it, call our office [phone] and ask to talk to a nurse. Not stopping a blood thinner prior to certain procedures could lead to serious complications. Diabetics on insulin: Notify the staff so that you can be scheduled 1st case in the morning. If your diabetes requires high dose insulin, take only  of your normal insulin dose the morning of the procedure and notify the staff that you have done so. Preventing infections: Shower with an antibacterial soap the morning of your procedure.  Build-up your immune system: Take 1000 mg of Vitamin C with every meal (3 times a day) the day prior to your procedure. Antibiotics: Inform the nursing staff if you are taking any antibiotics  or if you have any conditions that may require antibiotics prior to procedures. (Example: recent joint implants)   Pregnancy: If you are pregnant make sure to notify the nursing staff. Not doing so may result in injury to the  fetus, including death.  Sickness: If you have a cold, fever, or any active infections, call and cancel or reschedule your procedure. Receiving steroids while having an infection may result in complications. Arrival: You must be in the facility at least 30 minutes prior to your scheduled procedure. Tardiness: Your scheduled time is also the cutoff time. If you do not arrive at least 15 minutes prior to your procedure, you will be rescheduled.  Children: Do not bring any children with you. Make arrangements to keep them home. Dress appropriately: There is always a possibility that your clothing may get soiled. Avoid long dresses. Valuables: Do not bring any jewelry or valuables.  Reasons to call and reschedule or cancel your procedure: (Following these recommendations will minimize the risk of a serious complication.) Surgeries: Avoid having procedures within 2 weeks of any surgery. (Avoid for 2 weeks before or after any surgery). Flu Shots: Avoid having procedures within 2 weeks of a flu shots or . (Avoid for 2 weeks before or after immunizations). Barium: Avoid having a procedure within 7-10 days after having had a radiological study involving the use of radiological contrast. (Myelograms, Barium swallow or enema study). Heart attacks: Avoid any elective procedures or surgeries for the initial 6 months after a Myocardial Infarction (Heart Attack). Blood thinners: It is imperative that you stop these medications before procedures. Let us  know if you if you take any blood thinner.  Infection: Avoid procedures during or within two weeks of an infection (including chest colds or gastrointestinal problems). Symptoms associated with infections include: Localized redness, fever, chills, night sweats or profuse sweating, burning sensation when voiding, cough, congestion, stuffiness, runny nose, sore throat, diarrhea, nausea, vomiting, cold or Flu symptoms, recent or current infections. It is specially  important if the infection is over the area that we intend to treat. Heart and lung problems: Symptoms that may suggest an active cardiopulmonary problem include: cough, chest pain, breathing difficulties or shortness of breath, dizziness, ankle swelling, uncontrolled high or unusually low blood pressure, and/or palpitations. If you are experiencing any of these symptoms, cancel your procedure and contact your primary care physician for an evaluation.  Remember:  Regular Business hours are:  Monday to Thursday 8:00 AM to 4:00 PM  Provider's Schedule: Eric Como, MD:  Procedure days: Tuesday and Thursday 7:30 AM to 4:00 PM  Wallie Sherry, MD:  Procedure days: Monday and Wednesday 7:30 AM to 4:00 PM Last  Updated: 02/11/2023 ______________________________________________________________________     ______________________________________________________________________    General Risks and Possible Complications  Patient Responsibilities: It is important that you read this as it is part of your informed consent. It is our duty to inform you of the risks and possible complications associated with treatments offered to you. It is your responsibility as a patient to read this and to ask questions about anything that is not clear or that you believe was not covered in this document.  Patient's Rights: You have the right to refuse treatment. You also have the right to change your mind, even after initially having agreed to have the treatment done. However, under this last option, if you wait until the last second to change your mind, you may be charged for the materials used up to that point.  Introduction: Medicine is not an Visual merchandiser. Everything in Medicine, including the lack of treatment(s), carries the potential for danger, harm, or loss (which is by definition: Risk). In Medicine, a complication is a secondary problem, condition, or disease that can aggravate an already existing one.  All treatments carry the risk of possible complications. The fact that a side effects or complications occurs, does not imply that the treatment was conducted incorrectly. It must be clearly understood that these can happen even when everything is done following the highest safety standards.  No treatment: You can choose not to proceed with the proposed treatment alternative. The "PRO(s)" would include: avoiding the risk of complications associated with the therapy. The "CON(s)" would include: not getting any of the treatment benefits. These benefits fall under one of three categories: diagnostic; therapeutic; and/or palliative. Diagnostic benefits include: getting information which can ultimately lead to improvement of the disease or symptom(s). Therapeutic benefits are those associated with the successful treatment of the disease. Finally, palliative benefits are those related to the decrease of the primary symptoms, without necessarily curing the condition (example: decreasing the pain from a flare-up of a chronic condition, such as incurable terminal cancer).  General Risks and Complications: These are associated to most interventional treatments. They can occur alone, or in combination. They fall under one of the following six (6) categories: no benefit or worsening of symptoms; bleeding; infection; nerve damage; allergic reactions; and/or death. No benefits or worsening of symptoms: In Medicine there are no guarantees, only probabilities. No healthcare provider can ever guarantee that a medical treatment will work, they can only state the probability that it may. Furthermore, there is always the possibility that the condition may worsen, either directly, or indirectly, as a consequence of the treatment. Bleeding: This is more common if the patient is taking a blood thinner, either prescription or over the counter (example: Goody Powders, Fish oil, Aspirin, Garlic, etc.), or if suffering a condition  associated with impaired coagulation (example: Hemophilia, cirrhosis of the liver, low platelet counts, etc.). However, even if you do not have one on these, it can still happen. If you have any of these conditions, or take one of these drugs, make sure to notify your treating physician. Infection: This is more common in patients with a compromised immune system, either due to disease (example: diabetes, cancer, human immunodeficiency virus [HIV], etc.), or due to medications or treatments (example: therapies used to treat cancer and rheumatological diseases). However, even if you do not have one on these, it can still happen. If you have any of these conditions, or take one of these drugs, make sure to notify your treating physician. Nerve Damage: This is more common when the treatment is an invasive one, but it can also happen with the use of medications, such as those used in the treatment of cancer. The damage can occur to small secondary nerves, or to large primary ones, such as those in the spinal cord and brain. This damage may be temporary or permanent and it may lead to impairments that can range from temporary numbness to permanent paralysis and/or brain death. Allergic Reactions: Any time a substance or material comes in contact with our body, there is the possibility of an allergic reaction. These can range from a mild skin rash (contact dermatitis) to a severe systemic reaction (anaphylactic reaction), which can result in death. Death: In general, any medical intervention can result in death, most of the time due to an unforeseen complication. ______________________________________________________________________  ______________________________________________________________________    Steroid injections  Common steroids for injections Triamcinolone : Used by many sports medicine physicians for large joint and bursal injections, often combined with a local anesthetic like lidocaine . A  study focusing on coccydynia (tailbone pain) found triamcinolone  was more effective than betamethasone, suggesting it may also be preferable for other localized inflammation conditions. Methylprednisolone : A common alternative to triamcinolone  that is also a strong anti-inflammatory. It is available in different formulations, with the acetate suspension being the long-acting option for intra-articular injections. Dexamethasone : This is a non-particulate steroid, meaning it has a lower risk of tissue damage compared to particulate steroids like triamcinolone  and methylprednisolone . While less common for this specific use, it is an option for targeted injections.   Considerations for physicians Particulate vs. non-particulate steroids: Triamcinolone  and methylprednisolone  are particulate, meaning they can clump together. Dexamethasone  is non-particulate. Particulate steroids are often preferred for their longer-lasting effects but carry a theoretical higher risk for certain injections (though this is less of a concern in the costochondral joints). Combined injectate: Corticosteroids are typically mixed with a local anesthetic like lidocaine  to provide both immediate pain relief (from the anesthetic) and longer-term inflammation reduction (from the steroid). Imaging guidance: To ensure accurate placement of the needle and medication, physicians may use ultrasound or fluoroscopic guidance for the injection, especially in complex or refractory cases.   Patient guidance Before undergoing a steroid injection, discuss the options with your physician. They will determine the best steroid, dosage, and procedure for your specific case based on factors like: Severity of your condition History of response to other treatments Your overall health status Experience and preference of the physician  Last  Updated: 10/28/2023 ______________________________________________________________________

## 2024-01-14 ENCOUNTER — Telehealth: Payer: Self-pay

## 2024-01-14 NOTE — Telephone Encounter (Signed)
Post procedure follow up.  Patient states he is doing well 

## 2024-01-20 ENCOUNTER — Telehealth: Payer: Self-pay

## 2024-01-20 NOTE — Progress Notes (Unsigned)
 Pharmacy Quality Measure Review  This patient is appearing on a report for being at risk of failing the adherence measure for cholesterol (statin) medications this calendar year.   Medication: atorvastatin   Last fill date: 09/17/23 for 90 day supply  Will collaborate with provider to facilitate refill needs.  Stepan Verrette E. Marsh, PharmD, CPP Clinical Pharmacist [facility] [phone]

## 2024-01-22 MED ORDER — ATORVASTATIN CALCIUM 20 MG PO TABS: 20.0000 mg | ORAL_TABLET | Freq: Every day | ORAL | 3 refills | Status: AC

## 2024-01-27 ENCOUNTER — Ambulatory Visit (HOSPITAL_BASED_OUTPATIENT_CLINIC_OR_DEPARTMENT_OTHER): Admitting: Pain Medicine

## 2024-01-27 ENCOUNTER — Ambulatory Visit
Admission: RE | Admit: 2024-01-27 | Discharge: 2024-01-27 | Disposition: A | Discharge status: Home / Self Care | Source: Ambulatory Visit | Attending: Pain Medicine | Admitting: Pain Medicine

## 2024-01-27 ENCOUNTER — Encounter: Payer: Self-pay | Admitting: Pain Medicine

## 2024-01-27 VITALS — BP 129/78 | HR 78 | Temp 97.5°F | Resp 20 | Ht 70.0 in | Wt 190.0 lb

## 2024-01-27 DIAGNOSIS — M47816 Spondylosis without myelopathy or radiculopathy, lumbar region: Secondary | ICD-10-CM | POA: Insufficient documentation

## 2024-01-27 DIAGNOSIS — Z5189 Encounter for other specified aftercare: Secondary | ICD-10-CM

## 2024-01-27 DIAGNOSIS — M545 Low back pain, unspecified: Secondary | ICD-10-CM | POA: Insufficient documentation

## 2024-01-27 DIAGNOSIS — M47817 Spondylosis without myelopathy or radiculopathy, lumbosacral region: Secondary | ICD-10-CM | POA: Insufficient documentation

## 2024-01-27 DIAGNOSIS — G8918 Other acute postprocedural pain: Secondary | ICD-10-CM | POA: Diagnosis not present

## 2024-01-27 DIAGNOSIS — M5459 Other low back pain: Secondary | ICD-10-CM | POA: Insufficient documentation

## 2024-01-27 DIAGNOSIS — Z7901 Long term (current) use of anticoagulants: Secondary | ICD-10-CM | POA: Diagnosis not present

## 2024-01-27 DIAGNOSIS — Z09 Encounter for follow-up examination after completed treatment for conditions other than malignant neoplasm: Secondary | ICD-10-CM | POA: Diagnosis not present

## 2024-01-27 DIAGNOSIS — G8929 Other chronic pain: Secondary | ICD-10-CM

## 2024-01-27 MED ORDER — HYDROCODONE-ACETAMINOPHEN 5-325 MG PO TABS: 1.0000 | ORAL_TABLET | Freq: Four times a day (QID) | ORAL | 0 refills | Status: AC | PRN

## 2024-01-27 MED ORDER — PENTAFLUOROPROP-TETRAFLUOROETH EX AERO
INHALATION_SPRAY | Freq: Once | CUTANEOUS | Status: AC
  Administered 2024-01-27: 30 via TOPICAL

## 2024-01-27 MED ORDER — TRIAMCINOLONE ACETONIDE 40 MG/ML IJ SUSP
INTRAMUSCULAR | Status: AC
  Filled 2024-01-27: qty 1

## 2024-01-27 MED ORDER — TRIAMCINOLONE ACETONIDE 40 MG/ML IJ SUSP
40.0000 mg | Freq: Once | INTRAMUSCULAR | Status: AC
  Administered 2024-01-27: 40 mg

## 2024-01-27 MED ORDER — MIDAZOLAM HCL 5 MG/5ML IJ SOLN
0.5000 mg | Freq: Once | INTRAMUSCULAR | Status: AC
  Administered 2024-01-27: 1.5 mg via INTRAVENOUS

## 2024-01-27 MED ORDER — MIDAZOLAM HCL 5 MG/5ML IJ SOLN
INTRAMUSCULAR | Status: AC
  Filled 2024-01-27: qty 5

## 2024-01-27 MED ORDER — LIDOCAINE HCL 2 % IJ SOLN
20.0000 mL | Freq: Once | INTRAMUSCULAR | Status: AC
  Administered 2024-01-27: 400 mg

## 2024-01-27 MED ORDER — FENTANYL CITRATE (PF) 100 MCG/2ML IJ SOLN
25.0000 ug | INTRAMUSCULAR | Status: DC | PRN
  Administered 2024-01-27: 50 ug via INTRAVENOUS

## 2024-01-27 MED ORDER — FENTANYL CITRATE (PF) 100 MCG/2ML IJ SOLN
INTRAMUSCULAR | Status: AC
  Filled 2024-01-27: qty 2

## 2024-01-27 MED ORDER — ROPIVACAINE HCL 2 MG/ML IJ SOLN
9.0000 mL | Freq: Once | INTRAMUSCULAR | Status: AC
  Administered 2024-01-27: 9 mL via PERINEURAL

## 2024-01-27 MED ORDER — ROPIVACAINE HCL 2 MG/ML IJ SOLN
INTRAMUSCULAR | Status: AC
  Filled 2024-01-27: qty 20

## 2024-01-27 MED ORDER — LIDOCAINE HCL 2 % IJ SOLN
INTRAMUSCULAR | Status: AC
  Filled 2024-01-27: qty 20

## 2024-01-27 NOTE — Progress Notes (Signed)
 Safety precautions to be maintained throughout the outpatient stay will include: orient to surroundings, keep bed in low position, maintain call bell within reach at all times, provide assistance with transfer out of bed and ambulation.

## 2024-01-27 NOTE — Progress Notes (Signed)
 PROVIDER NOTE: Interpretation of information contained herein should be left to medically-trained personnel. Specific patient instructions are provided elsewhere under Patient Instructions section of medical record. This document was created in part using STT-dictation technology, any transcriptional errors that may result from this process are unintentional.  Patient: Rodney Hall. Type: Established DOB: 1945/10/29 MRN: [id number] PCP: Gasper Nancyann BRAVO, MD  Service: Procedure DOS: 01/27/2024 Setting: Ambulatory Location: Ambulatory outpatient facility Delivery: Face-to-face Provider: Eric DELENA Como, MD Specialty: Interventional Pain Management Specialty designation: 09 Location: Outpatient facility Ref. Prov.: Gasper Nancyann BRAVO, MD       Interventional Therapy   Procedure: Lumbar Facet, Medial Branch Radiofrequency Ablation (RFA) #1  Laterality: Left (-LT)  Level: L2, L3, L4, L5, and S1 Medial Branch Level(s). These levels will denervate the L3-4, L4-5, and L5-S1 lumbar facet joints.  Imaging: Fluoroscopy-guided Spinal ([id number]) Anesthesia: Local anesthesia (1-2% Lidocaine ) Anxiolysis: IV Versed  2.0 mg            Sedation: Minimal Sedation Fentanyl  1 mL (50 mcg) DOS: 01/27/2024  Performed by: Eric DELENA Como, MD  Purpose: Therapeutic/Palliative Indications: Low back pain severe enough to impact quality of life or function. Indications: 1. 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 Low back pain of over 3 months duration   3. Lumbar facet joint pain   4. Lumbar facet hypertrophy (Multilevel) (Bilateral)   5. Lumbar facet joint syndrome   6. Spondylosis without myelopathy or radiculopathy, lumbosacral region    Mr. Hanger has been dealing with the above chronic pain for longer than three months and has either failed to respond, was unable to tolerate, or simply did not get enough benefit from other more conservative therapies including,  but not limited to: 1. Over-the-counter medications 2. Anti-inflammatory medications 3. Muscle relaxants 4. Membrane stabilizers 5. Opioids 6. Physical therapy and/or chiropractic manipulation 7. Modalities (Heat, ice, etc.) 8. Invasive techniques such as nerve blocks. Mr. Levene has attained more than 50% relief of the pain from a series of diagnostic injections conducted in separate occasions.  Pain Score: Pre-procedure: 4 /10 Post-procedure: 0-No pain/10     Position / Prep / Materials:  Position: Prone  Prep solution: ChloraPrep (2% chlorhexidine  gluconate and 70% isopropyl alcohol) Prep Area: Entire Lumbosacral Region (Lower back from mid-thoracic region to end of tailbone and from flank to flank.) Materials:  Tray: RFA (Radiofrequency) tray Needle(s):  Type: RFA (Teflon-coated radiofrequency ablation needles) Gauge (G): 22  Length: Regular (10cm) Qty: 5     Post-Procedure Evaluation   Procedure: Lumbar Facet, Medial Branch Radiofrequency Ablation (RFA) #1  Laterality: Right (-RT)  Level: L2, L3, L4, L5, and S1 Medial Branch Level(s). These levels will denervate the L3-4, L4-5, and L5-S1 lumbar facet joints.  Imaging: Fluoroscopy-guided Spinal ([id number]) Anesthesia: Local anesthesia (1-2% Lidocaine ) Anxiolysis: IV Versed  2.0 mg            Sedation: Minimal Sedation None required. No Fentanyl  administered.         DOS: 01/13/2024  Performed by: Eric DELENA Como, MD Purpose: Therapeutic/Palliative Indications: Low back pain severe enough to impact quality of life or function.  Pain Score: Pre-procedure: 3 /10 Post-procedure: 0-No pain/10    Effectiveness:  Initial hour after procedure: 100 %. Subsequent 4-6 hours post-procedure: 80 %. Analgesia past initial 6 hours: 60 % (current).  H&P (Pre-op Assessment):  Mr. Saiki is a 78 y.o. (year old), male patient, seen today for interventional treatment. 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 Mr. Schertzer has a current medication list which includes the following prescription(s): acetaminophen , aspirin  ec, atorvastatin , ferrous sulfate, hydrocodone -acetaminophen , [START ON 02/03/2024] hydrocodone -acetaminophen , imipramine , omeprazole , tamsulosin , and terbinafine , and the following Facility-Administered Medications: fentanyl . His primarily concern today is the Back Pain  Initial Vital Signs:  Pulse/HCG Rate: 78ECG Heart Rate: 80 (nsr) Temp: (!) 97.3 F (36.3 C) Resp: 16 BP: 126/80 SpO2: 99 %  BMI: Estimated body mass index is 27.26 kg/m as calculated from the following:   Height as of this encounter: 5' 10 (1.778 m).   Weight as of this encounter: 190 lb (86.2 kg).  Risk Assessment: Allergies: Reviewed. He is allergic to pravastatin  and amoxicillin.  Allergy Precautions: None required Coagulopathies: Reviewed. None identified.  Blood-thinner therapy: None at this time Active Infection(s): Reviewed. None identified. Mr. Kovar is afebrile  Site Confirmation: Mr. Palinkas was asked to confirm the procedure and laterality before marking the site Procedure checklist: Completed Consent: Before the procedure and under the influence of no sedative(s),  amnesic(s), or anxiolytics, the patient was informed of the treatment options, risks and possible complications. To fulfill our ethical and legal obligations, as recommended by the American Medical Association's Code of Ethics, I have informed the patient of my clinical impression; the nature and purpose of the treatment or procedure; the risks, benefits, and possible complications of the intervention; the alternatives, including doing nothing; the risk(s) and benefit(s) of the alternative treatment(s) or procedure(s); and the risk(s) and benefit(s) of doing nothing. The patient was provided information about the general risks and possible complications associated with the procedure. These may include, but are not limited to: failure to achieve desired goals, infection, bleeding, organ or nerve damage, allergic reactions, paralysis, and death. In addition, the patient was informed of those risks and complications associated to Spine-related procedures, such as failure to decrease pain; infection (i.e.: Meningitis, epidural or intraspinal abscess); bleeding (i.e.: epidural hematoma, subarachnoid hemorrhage, or any other type of intraspinal or peri-dural bleeding); organ or nerve damage (i.e.: Any type of peripheral nerve, nerve root, or spinal cord injury) with subsequent damage to sensory, motor, and/or autonomic systems, resulting in permanent pain, numbness, and/or weakness of one or several areas of the body; allergic reactions; (i.e.: anaphylactic reaction); and/or death. Furthermore, the patient was informed of those risks and complications associated with the medications. These include, but are not limited to: allergic reactions (i.e.: anaphylactic or anaphylactoid reaction(s)); adrenal axis suppression; blood sugar elevation that in diabetics may result in ketoacidosis or comma; water  retention that in patients with history of congestive heart failure may result in shortness of breath, pulmonary edema, and  decompensation with resultant heart failure; weight gain; swelling or edema; medication-induced neural toxicity; particulate matter embolism and blood vessel occlusion with resultant organ, and/or nervous system infarction; and/or aseptic necrosis of one or more joints. Finally, the patient was informed that Medicine is not an exact science; therefore, there is also the possibility of unforeseen or unpredictable risks and/or possible  complications that may result in a catastrophic outcome. The patient indicated having understood very clearly. We have given the patient no guarantees and we have made no promises. Enough time was given to the patient to ask questions, all of which were answered to the patient's satisfaction. Mr. Schirmer has indicated that he wanted to continue with the procedure. Attestation: I, the ordering provider, attest that I have discussed with the patient the benefits, risks, side-effects, alternatives, likelihood of achieving goals, and potential problems during recovery for the procedure that I have provided informed consent. Date  Time: 01/27/2024  7:53 AM  Pre-Procedure Preparation:  Monitoring: As per clinic protocol. Respiration, ETCO2, SpO2, BP, heart rate and rhythm monitor placed and checked for adequate function Safety Precautions: Patient was assessed for positional comfort and pressure points before starting the procedure. Time-out: I initiated and conducted the Time-out before starting the procedure, as per protocol. The patient was asked to participate by confirming the accuracy of the Time Out information. Verification of the correct person, site, and procedure were performed and confirmed by me, the nursing staff, and the patient. Time-out conducted as per Joint Commission's Universal Protocol (UP.01.01.01). Time: 0841 Start Time: 0841 hrs.  Description of Procedure:          Laterality: See above. Levels:  See above. Safety Precautions: Aspiration looking  for blood return was conducted prior to all injections. At no point did we inject any substances, as a needle was being advanced. Before injecting, the patient was told to immediately notify me if he was experiencing any new onset of ringing in the ears, or metallic taste in the mouth. No attempts were made at seeking any paresthesias. Safe injection practices and needle disposal techniques used. Medications properly checked for expiration dates. SDV (single dose vial) medications used. After the completion of the procedure, all disposable equipment used was discarded in the proper designated medical waste containers. Local Anesthesia: Protocol guidelines were followed. The patient was positioned over the fluoroscopy table. The area was prepped in the usual manner. The time-out was completed. The target area was identified using fluoroscopy. A 12-in long, straight, sterile hemostat was used with fluoroscopic guidance to locate the targets for each level blocked. Once located, the skin was marked with an approved surgical skin marker. Once all sites were marked, the skin (epidermis, dermis, and hypodermis), as well as deeper tissues (fat, connective tissue and muscle) were infiltrated with a small amount of a short-acting local anesthetic, loaded on a 10cc syringe with a 25G, 1.5-in  Needle. An appropriate amount of time was allowed for local anesthetics to take effect before proceeding to the next step. Technical description of process:  Radiofrequency Ablation (RFA) L2 Medial Branch Nerve RFA: The target area for the L2 medial branch is at the junction of the postero-lateral aspect of the superior articular process and the superior, posterior, and medial edge of the transverse process of L3. Under fluoroscopic guidance, a Radiofrequency needle was inserted until contact was made with os over the superior postero-lateral aspect of the pedicular shadow (target area). Sensory and motor testing was conducted to  properly adjust the position of the needle. Once satisfactory placement of the needle was achieved, the numbing solution was slowly injected after negative aspiration for blood. 2.0 mL of the nerve block solution was injected without difficulty or complication. After waiting for at least 3 minutes, the ablation was performed. Once completed, the needle was removed intact. L3 Medial Branch Nerve RFA: The target area for  the L3 medial branch is at the junction of the postero-lateral aspect of the superior articular process and the superior, posterior, and medial edge of the transverse process of L4. Under fluoroscopic guidance, a Radiofrequency needle was inserted until contact was made with os over the superior postero-lateral aspect of the pedicular shadow (target area). Sensory and motor testing was conducted to properly adjust the position of the needle. Once satisfactory placement of the needle was achieved, the numbing solution was slowly injected after negative aspiration for blood. 2.0 mL of the nerve block solution was injected without difficulty or complication. After waiting for at least 3 minutes, the ablation was performed. Once completed, the needle was removed intact. L4 Medial Branch Nerve RFA: The target area for the L4 medial branch is at the junction of the postero-lateral aspect of the superior articular process and the superior, posterior, and medial edge of the transverse process of L5. Under fluoroscopic guidance, a Radiofrequency needle was inserted until contact was made with os over the superior postero-lateral aspect of the pedicular shadow (target area). Sensory and motor testing was conducted to properly adjust the position of the needle. Once satisfactory placement of the needle was achieved, the numbing solution was slowly injected after negative aspiration for blood. 2.0 mL of the nerve block solution was injected without difficulty or complication. After waiting for at least 3  minutes, the ablation was performed. Once completed, the needle was removed intact. L5 Medial Branch Nerve RFA: The target area for the L5 medial branch is at the junction of the postero-lateral aspect of the superior articular process of S1 and the superior, posterior, and medial edge of the sacral ala. Under fluoroscopic guidance, a Radiofrequency needle was inserted until contact was made with os over the superior postero-lateral aspect of the pedicular shadow (target area). Sensory and motor testing was conducted to properly adjust the position of the needle. Once satisfactory placement of the needle was achieved, the numbing solution was slowly injected after negative aspiration for blood. 2.0 mL of the nerve block solution was injected without difficulty or complication. After waiting for at least 3 minutes, the ablation was performed. Once completed, the needle was removed intact. S1 Medial Branch Nerve RFA: The target area for the S1 medial branch is located inferior to the junction of the S1 superior articular process and the L5 inferior articular process, posterior, inferior, and lateral to the 6 o'clock position of the L5-S1 facet joint, just superior to the S1 posterior foramen. Under fluoroscopic guidance, the Radiofrequency needle was advanced until contact was made with os over the Target area. Sensory and motor testing was conducted to properly adjust the position of the needle. Once satisfactory placement of the needle was achieved, the numbing solution was slowly injected after negative aspiration for blood. 2.0 mL of the nerve block solution was injected without difficulty or complication. After waiting for at least 3 minutes, the ablation was performed. Once completed, the needle was removed intact. Radiofrequency lesioning (ablation):  Radiofrequency Generator: Medtronic AccurianTM AG 1000 RF Generator Sensory Stimulation Parameters: 50 Hz was used to locate & identify the nerve, making  sure that the needle was positioned such that there was no sensory stimulation below 0.3 V or above 0.7 V. Motor Stimulation Parameters: 2 Hz was used to evaluate the motor component. Care was taken not to lesion any nerves that demonstrated motor stimulation of the lower extremities at an output of less than 2.5 times that of the sensory threshold, or a  maximum of 2.0 V. Lesioning Technique Parameters: Standard Radiofrequency settings. (Not bipolar or pulsed.) Temperature Settings: 80 degrees C Lesioning time: 60 seconds Stationary intra-operative compliance: Compliant  Once the entire procedure was completed, the treated area was cleaned, making sure to leave some of the prepping solution back to take advantage of its long term bactericidal properties.    Illustration of the posterior view of the lumbar spine and the posterior neural structures. Laminae of L2 through S1 are labeled. DPRL5, dorsal primary ramus of L5; DPRS1, dorsal primary ramus of S1; DPR3, dorsal primary ramus of L3; FJ, facet (zygapophyseal) joint L3-L4; I, inferior articular process of L4; LB1, lateral branch of dorsal primary ramus of L1; IAB, inferior articular branches from L3 medial branch (supplies L4-L5 facet joint); IBP, intermediate branch plexus; MB3, medial branch of dorsal primary ramus of L3; NR3, third lumbar nerve root; S, superior articular process of L5; SAB, superior articular branches from L4 (supplies L4-5 facet joint also); TP3, transverse process of L3.  Facet Joint Innervation (* possible contribution)  L1-2 T12, L1 (L2*)  Medial Branch  L2-3 L1, L2 (L3*)                     L3-4 L2, L3 (L4*)                     L4-5 L3, L4 (L5*)                     L5-S1 L4, L5, S1                        Vitals:   01/27/24 0910 01/27/24 0917 01/27/24 0927 01/27/24 0936  BP: (!) 179/119 131/76 120/75 129/78  Pulse:      Resp: 15 14 13 20   Temp:  (!) 97.4 F (36.3 C)  (!) 97.5 F (36.4 C)  TempSrc:   Temporal  Temporal  SpO2: 100% 96% 98% 96%  Weight:      Height:        Start Time: 0841 hrs. End Time: 0910 hrs.  Imaging Guidance (Spinal):         Type of Imaging Technique: Fluoroscopy Guidance (Spinal) Indication(s): Fluoroscopy guidance for needle placement to enhance accuracy in procedures requiring precise needle localization for targeted delivery of medication in or near specific anatomical locations not easily accessible without such real-time imaging assistance. Exposure Time: Please see nurses notes. Contrast: None used. Fluoroscopic Guidance: I was personally present during the use of fluoroscopy. Tunnel Vision Technique used to obtain the best possible view of the target area. Parallax error corrected before commencing the procedure. Direction-depth-direction technique used to introduce the needle under continuous pulsed fluoroscopy. Once target was reached, antero-posterior, oblique, and lateral fluoroscopic projection used confirm needle placement in all planes. Images permanently stored in EMR. Interpretation: No contrast injected. I personally interpreted the imaging intraoperatively. Adequate needle placement confirmed in multiple planes. Permanent images saved into the patient's record.  Antibiotic Prophylaxis:   Anti-infectives (From admission, onward)    None      Indication(s): None identified  Post-operative Assessment:  Post-procedure Vital Signs:  Pulse/HCG Rate: 7875 Temp: (!) 97.5 F (36.4 C) Resp: 20 BP: 129/78 SpO2: 96 %  EBL: None  Complications: No immediate post-treatment complications observed by team, or reported by patient.  Note: The patient tolerated the entire procedure well. A repeat set of vitals were taken after the procedure and the  patient was kept under observation following institutional policy, for this type of procedure. Post-procedural neurological assessment was performed, showing return to baseline, prior to discharge. The  patient was provided with post-procedure discharge instructions, including a section on how to identify potential problems. Should any problems arise concerning this procedure, the patient was given instructions to immediately contact us , at any time, without hesitation. In any case, we plan to contact the patient by telephone for a follow-up status report regarding this interventional procedure.  Comments:  No additional relevant information.  Plan of Care (POC)  Orders:  Orders Placed This Encounter  Procedures   Radiofrequency,Lumbar    Scheduling Instructions:     Side(s): Left-sided     Level: L3-4, L4-5, and L5-S1 Facets (L2, L3, L4, L5, and S1 Medial Branch)     Sedation: With Sedation.     Date: 01/27/2024    Where will this procedure be performed?:   [facility]   DG [facility] C-ARM 1-60 MIN NO REPORT    Intraoperative interpretation by procedural physician at [facility].    Standing Status:   Standing    Number of Occurrences:   1    Reason for exam::   Assistance in needle guidance and placement for procedures requiring needle placement in or near specific anatomical locations not easily accessible without such assistance.   Nursing Instructions:    Please complete this patient's postprocedure evaluation.    Scheduling Instructions:     Please complete this patient's postprocedure evaluation.   Informed Consent Details: Physician/Practitioner Attestation; Transcribe to consent form and obtain patient signature    Nursing Order: Transcribe to consent form and obtain patient signature. Note: Always confirm laterality of pain with Mr. Guderian, before procedure.    Physician/Practitioner attestation of informed consent for procedure/surgical case:   I, the physician/practitioner, attest that I have discussed with the patient the benefits, risks, side effects, alternatives, likelihood of achieving goals and potential problems during recovery for the procedure  that I have provided informed consent.    Procedure:   Lumbar Facet Radiofrequency Ablation    Physician/Practitioner performing the procedure:   Azarian Starace A. Tanya, MD    Indication/Reason:   Low Back Pain, with our without leg pain, due to Facet Joint Arthralgia (Joint Pain) known as Lumbar Facet Syndrome, secondary to Lumbar, and/or Lumbosacral Spondylosis (Arthritis of the Spine), without myelopathy or radiculopathy (Nerve Damage).   Care order/instruction: Please confirm that the patient has stopped the Plavix  (Clopidogrel ) x 7-10 days prior to procedure or surgery.    Please confirm that the patient has stopped the Plavix  (Clopidogrel ) x 7-10 days prior to procedure or surgery.    Standing Status:   Standing    Number of Occurrences:   1   Provide equipment / supplies at bedside    Procedure tray: Radiofrequency Tray Additional material: Large hemostat (x1); Small hemostat (x1); Towels (x8); 4x4 sterile sponge pack (x1) Needle type: Teflon-coated Radiofrequency Needle (Disposable  single use) Size: Regular Quantity: 5    Standing Status:   Standing    Number of Occurrences:   1    Specify:   Radiofrequency Tray   Saline lock IV    Have LR [id number] mL available and administer at 125 mL/hr if patient becomes hypotensive.    Standing Status:   Standing    Number of Occurrences:   1   Bleeding precautions    Standing Status:   Standing    Number of Occurrences:  1     Opioid Analgesic(s): No chronic opioid analgesics therapy prescribed by our practice. None. MME/day: 0 mg/day    Medications ordered for procedure: Meds ordered this encounter  Medications   HYDROcodone -acetaminophen  (NORCO/VICODIN) 5-325 MG tablet    Sig: Take 1 tablet by mouth every 6 (six) hours as needed for up to 7 days for severe pain (pain score 7-10). Must last 7 days.    Dispense:  28 tablet    Refill:  0    For acute post-operative pain. Not to be refilled. Must last 7 days.    HYDROcodone -acetaminophen  (NORCO/VICODIN) 5-325 MG tablet    Sig: Take 1 tablet by mouth every 6 (six) hours as needed for up to 7 days for severe pain (pain score 7-10). Must last 7 days.    Dispense:  28 tablet    Refill:  0    For acute post-operative pain. Not to be refilled.  Must last 7 days.   lidocaine  (XYLOCAINE ) 2 % (with pres) injection 400 mg   pentafluoroprop-tetrafluoroeth (GEBAUERS) aerosol   midazolam  (VERSED ) 5 MG/5ML injection 0.5-2 mg    Make sure Flumazenil is available in the pyxis when using this medication. If oversedation occurs, administer 0.2 mg IV over 15 sec. If after 45 sec no response, administer 0.2 mg again over 1 min; may repeat at 1 min intervals; not to exceed 4 doses (1 mg)   fentaNYL  (SUBLIMAZE ) injection 25-50 mcg    Make sure Narcan is available in the pyxis when using this medication. In the event of respiratory depression (RR< 8/min): Titrate NARCAN (naloxone) in increments of 0.1 to 0.2 mg IV at 2-3 minute intervals, until desired degree of reversal.   ropivacaine  (PF) 2 mg/mL (0.2%) (NAROPIN ) injection 9 mL   triamcinolone  acetonide (KENALOG -40) injection 40 mg   Medications administered: We administered lidocaine , pentafluoroprop-tetrafluoroeth, midazolam , fentaNYL , ropivacaine  (PF) 2 mg/mL (0.2%), and triamcinolone  acetonide.  See the medical record for exact dosing, route, and time of administration.    Interventional Therapies  Risk Factors  Considerations  Medical Comorbidities:  Plavix  D/C'ed  CAD  Hx. NSTEMI  S/P CABG x5  Stage 3b CKD  T2NIDDM        Planned  Pending:   Therapeutic left lumbar facet RFA #1 (01/27/2024)    Under consideration:   Possible Racz procedure #2    Completed:   Therapeutic right lumbar facet RFA x1 (01/13/2024) (100/80/80/80)  Diagnostic bilateral lumbar facet MBB x2 (08/28/2023) (100/100/60/60)  Therapeutic left Racz procedure there x1 (06/12/2023) (100/100/80/80)  Diagnostic/therapeutic left  caudal ESI + diagnostic epidurogram x1 (05/08/2023) (100/100/50/50)    Therapeutic  Palliative (PRN) options:   None established   Completed by other providers:   Surgery: Right L2-3 lateral transpsoas interbody fusion w/ lateral plating and L2-3 decompression (07/30/2016) by Morene DOROTHA Stammer, MD (Neurosurgery)  Surgery: Left L3-4 and L4-5 anterolateral fusion with interbody spacer and right L3-4 and L4-5 pedicle screws (03/13/2010) by Darina Boehringer, MD (Neurosurgery)  [facility] Procedures Date Name LateralityProvider Name and Address 12/03/2022 G-RDR SNRB                                             Charlie Dolores, MD 01/17/2022 G-RDR [facility] 2 Level  Charlie Dolores, MD 07/03/2021 G-RDR SN[facility] 2 Level                               Manuelita Shoulder 05/01/2021 G-JCB Trigger Point                                 Reyes Billing, MD 12/04/2020 G-RDR [facility] 2 Level                      Charlie Dolores, MD 07/18/2020 G-RDR SNRB                                              Charlie Dolores, MD 01/17/2020 shelbie Lumbar TFESIK U1 Level  Charleen Martins, MD 09/25/2018 Lumbar Laminectomy    09/20/2018 LAMINOTOMY (HEMILAMINECTOMY), DECOMPRESSION OF NERVE ROOTS, PARTIAL FACECTOTOMY, FORAMINOTOMY AND/OR DISC REMOVAL, LUMBAR (SURG) Shanda Lenis 07/30/2016 Lumbar Laminectomy   April Webb 03/07/2011 Lumbar Laminectomy   April Webb 03/06/2001 Lumbar Laminectomy       Follow-up plan:   Return in about 6 weeks (around 03/09/2024) for (Face2F), (PPE).     Recent Visits Date Type Provider Dept  01/13/24 Procedure visit Tanya Glisson, MD [facility]  12/29/23 Office Visit Tanya Glisson, MD [facility]  Showing recent visits within past 90 days and meeting all other requirements Today's Visits Date Type Provider Dept  01/27/24 Procedure visit Tanya Glisson, MD [facility]  Showing today's visits and meeting all other  requirements Future Appointments Date Type Provider Dept  03/09/24 Appointment Tanya Glisson, MD [facility]  Showing future appointments within next 90 days and meeting all other requirements   Disposition: Discharge home  Discharge (Date  Time): 01/27/2024; 0937 hrs.   Primary Care Physician: Gasper Nancyann BRAVO, MD Location: [facility] Note by: Glisson DELENA Tanya, MD (TTS technology used. I apologize for any typographical errors that were not detected and corrected.) Date: 01/27/2024; Time: 10:06 AM  Disclaimer:  Medicine is not an visual merchandiser. The only guarantee in medicine is that nothing is guaranteed. It is important to note that the decision to proceed with this intervention was based on the information collected from the patient. The Data and conclusions were drawn from the patient's questionnaire, the interview, and the physical examination. Because the information was provided in large part by the patient, it cannot be guaranteed that it has not been purposely or unconsciously manipulated. Every effort has been made to obtain as much relevant data as possible for this evaluation. It is important to note that the conclusions that lead to this procedure are derived in large part from the available data. Always take into account that the treatment will also be dependent on availability of resources and existing treatment guidelines, considered by other Pain Management Practitioners as being common knowledge and practice, at the time of the intervention. For Medico-Legal purposes, it is also important to point out that variation in procedural techniques and pharmacological choices are the acceptable norm. The indications, contraindications, technique, and results of the above procedure should only be interpreted and judged by a Board-Certified Interventional Pain Specialist with extensive familiarity and expertise in the same exact procedure and  technique.

## 2024-01-27 NOTE — Patient Instructions (Signed)
 ______________________________________________________________________    Post-Radiofrequency (RF) Discharge Instructions  You have just completed a Radiofrequency Neurotomy.  The following instructions will provide you with information and guidelines for self-care upon discharge.  If at any time you have questions or concerns please call your physician. DO NOT DRIVE YOURSELF!!  Instructions: Apply ice: Fill a plastic sandwich bag with crushed ice. Cover it with a small towel and apply to injection site. Apply for 15 minutes then remove x 15 minutes. Repeat sequence on day of procedure, until you go to bed. The purpose is to minimize swelling and discomfort after procedure. Apply heat: Apply heat to procedure site starting the day following the procedure. The purpose is to treat any soreness and discomfort from the procedure. Food intake: No eating limitations, unless stipulated above.  Nevertheless, if you have had sedation, you may experience some nausea.  In this case, it may be wise to wait at least two hours prior to resuming regular diet. Physical activities: Keep activities to a minimum for the first 8 hours after the procedure. For the first 24 hours after the procedure, do not drive a motor vehicle,  Operate heavy machinery, power tools, or handle any weapons.  Consider walking with the use of an assistive device or accompanied by an adult for the first 24 hours.  Do not drink alcoholic beverages including beer.  Do not make any important decisions or sign any legal documents. Go home and rest today.  Resume activities tomorrow, as tolerated.  Use caution in moving about as you may experience mild leg weakness.  Use caution in cooking, use of household electrical appliances and climbing steps. Driving: If you have received any sedation, you are not allowed to drive for 24 hours after your procedure. Blood thinner: Restart your blood thinner 6 hours after your procedure. (Only for those taking  blood thinners) Insulin : As soon as you can eat, you may resume your normal dosing schedule. (Only for those taking insulin ) Medications: May resume pre-procedure medications.  Do not take any drugs, other than what has been prescribed to you. Infection prevention: Keep procedure site clean and dry. Post-procedure Pain Diary: Extremely important that this be done correctly and accurately. Recorded information will be used to determine the next step in treatment. Pain evaluated is that of treated area only. Do not include pain from an untreated area. Complete every hour, on the hour, for the initial 8 hours. Set an alarm to help you do this part accurately. Do not go to sleep and have it completed later. It will not be accurate. Follow-up appointment: Keep your follow-up appointment after the procedure. Usually 2-6 weeks after radiofrequency. Bring you pain diary. The information collected will be essential for your long-term care.   Expect: From numbing medicine (AKA: Local Anesthetics): Numbness or decrease in pain. Onset: Full effect within 15 minutes of injected. Duration: It will depend on the type of local anesthetic used. On the average, 1 to 8 hours.  From steroids (when added): Decrease in swelling or inflammation. Once inflammation is improved, relief of the pain will follow. Onset of benefits: Depends on the amount of swelling present. The more swelling, the longer it will take for the benefits to be seen. In some cases, up to 10 days. Duration: Steroids will stay in the system x 2 weeks. Duration of benefits will depend on multiple posibilities including persistent irritating factors. From procedure: Some discomfort is to be expected once the numbing medicine wears off. In the case of  radiofrequency procedures, this may last as long as 6-8 weeks. Additional post-procedure pain medication is provided for this. Discomfort is minimized if ice and heat are applied as instructed.  Call  if: You experience numbness and weakness that gets worse with time, as opposed to wearing off. He experience any unusual bleeding, difficulty breathing, or loss of the ability to control your bowel and bladder. (This applies to Spinal procedures only) You experience any redness, swelling, heat, red streaks, elevated temperature, fever, or any other signs of a possible infection.  Emergency Numbers: Durning business hours (Monday - Thursday, 8:00 AM - 4:00 PM) (Friday, 9:00 AM - 12:00 Noon): [phone] After hours: [phone] ______________________________________________________________________     ______________________________________________________________________    Opioid Pain Medication Update  To: All patients taking opioid pain medications. (I.e.: hydrocodone , hydromorphone , oxycodone , oxymorphone, morphine , codeine, methadone, tapentadol, tramadol , buprenorphine, fentanyl , etc.)  Re: Updated review of side effects and adverse reactions of opioid analgesics, as well as new information about long term effects of this class of medications.  Direct risks of long-term opioid therapy are not limited to opioid addiction and overdose. Potential medical risks include serious fractures, breathing problems during sleep, hyperalgesia, immunosuppression, chronic constipation, bowel obstruction, myocardial infarction, and tooth decay secondary to xerostomia.  Unpredictable adverse effects that can occur even if you take your medication correctly: Cognitive impairment, respiratory depression, and death. Most people think that if they take their medication correctly, and as instructed, that they will be safe. Nothing could be farther from the truth. In reality, a significant amount of recorded deaths associated with the use of opioids has occurred in individuals that had taken the medication for a long time, and were taking their medication correctly. The following are examples of how this  can happen: Patient taking his/her medication for a long time, as instructed, without any side effects, is given a certain antibiotic or another unrelated medication, which in turn triggers a Drug-to-drug interaction leading to disorientation, cognitive impairment, impaired reflexes, respiratory depression or an untoward event leading to serious bodily harm or injury, including death.  Patient taking his/her medication for a long time, as instructed, without any side effects, develops an acute impairment of liver and/or kidney function. This will lead to a rapid inability of the body to breakdown and eliminate their pain medication, which will result in effects similar to an overdose, but with the same medicine and dose that they had always taken. This again may lead to disorientation, cognitive impairment, impaired reflexes, respiratory depression or an untoward event leading to serious bodily harm or injury, including death.  A similar problem will occur with patients as they grow older and their liver and kidney function begins to decrease as part of the aging process.  Background information: Historically, the original case for using long-term opioid therapy to treat chronic noncancer pain was based on safety assumptions that subsequent experience has called into question. In 1996, the American Pain Society and the American Academy of Pain Medicine issued a consensus statement supporting long-term opioid therapy. This statement acknowledged the dangers of opioid prescribing but concluded that the risk for addiction was low; respiratory depression induced by opioids was short-lived, occurred mainly in opioid-naive patients, and was antagonized by pain; tolerance was not a common problem; and efforts to control diversion should not constrain opioid prescribing. This has now proven to be wrong. Experience regarding the risks for opioid addiction, misuse, and overdose in community practice has failed to  support these assumptions.  According  to the Centers for Disease Control and Prevention, fatal overdoses involving opioid analgesics have increased sharply over the past decade. Currently, more than 96,700 people die from drug overdoses every year. Opioids are a factor in 7 out of every 10 overdose deaths. Deaths from drug overdose have surpassed motor vehicle accidents as the leading cause of death for individuals between the ages of 63 and 81.  Clinical data suggest that neuroendocrine dysfunction may be very common in both men and women, potentially causing hypogonadism, erectile dysfunction, infertility, decreased libido, osteoporosis, and depression. Recent studies linked higher opioid dose to increased opioid-related mortality. Controlled observational studies reported that long-term opioid therapy may be associated with increased risk for cardiovascular events. Subsequent meta-analysis concluded that the safety of long-term opioid therapy in elderly patients has not been proven.   Side Effects and adverse reactions: Common side effects: Drowsiness (sedation). Dizziness. Nausea and vomiting. Constipation. Physical dependence -- Dependence often manifests with withdrawal symptoms when opioids are discontinued or decreased. Tolerance -- As you take repeated doses of opioids, you require increased medication to experience the same effect of pain relief. Respiratory depression -- This can occur in healthy people, especially with higher doses. However, people with COPD, asthma or other lung conditions may be even more susceptible to fatal respiratory impairment.  Uncommon side effects: An increased sensitivity to feeling pain and extreme response to pain (hyperalgesia). Chronic use of opioids can lead to this. Delayed gastric emptying (the process by which the contents of your stomach are moved into your small intestine). Muscle rigidity. Immune system and hormonal dysfunction. Quick,  involuntary muscle jerks (myoclonus). Arrhythmia. Itchy skin (pruritus). Dry mouth (xerostomia).  Long-term side effects: Chronic constipation. Sleep-disordered breathing (SDB). Increased risk of bone fractures. Hypothalamic-pituitary-adrenal dysregulation. Increased risk of overdose.  RISKS: Respiratory depression and death: Opioids increase the risk of respiratory depression and death.  Drug-to-drug interactions: Opioids are relatively contraindicated in combination with benzodiazepines, sleep inducers, and other central nervous system depressants. Other classes of medications (i.e.: certain antibiotics and even over-the-counter medications) may also trigger or induce respiratory depression in some patients.  Medical conditions: Patients with pre-existing respiratory problems are at higher risk of respiratory failure and/or depression when in combination with opioid analgesics. Opioids are relatively contraindicated in some medical conditions such as central sleep apnea.   Fractures and Falls:  Opioids increase the risk and incidence of falls. This is of particular importance in elderly patients.  Endocrine System:  Long-term administration is associated with endocrine abnormalities (endocrinopathies). (Also known as Opioid-induced Endocrinopathy) Influences on both the hypothalamic-pituitary-adrenal axis?and the hypothalamic-pituitary-gonadal axis have been demonstrated with consequent hypogonadism and adrenal insufficiency in both sexes. Hypogonadism and decreased levels of dehydroepiandrosterone sulfate have been reported in men and women. Endocrine effects include: Amenorrhoea in women (abnormal absence of menstruation) Reduced libido in both sexes Decreased sexual function Erectile dysfunction in men Hypogonadisms (decreased testicular function with shrinkage of testicles) Infertility Depression and fatigue Loss of muscle mass Anxiety Depression Immune  suppression Hyperalgesia Weight gain Anemia Osteoporosis Patients (particularly women of childbearing age) should avoid opioids. There is insufficient evidence to recommend routine monitoring of asymptomatic patients taking opioids in the long-term for hormonal deficiencies.  Immune System: Human studies have demonstrated that opioids have an immunomodulating effect. These effects are mediated via opioid receptors both on immune effector cells and in the central nervous system. Opioids have been demonstrated to have adverse effects on antimicrobial response and anti-tumour surveillance. Buprenorphine has been demonstrated to have no impact on immune  function.  Opioid Induced Hyperalgesia: Human studies have demonstrated that prolonged use of opioids can lead to a state of abnormal pain sensitivity, sometimes called opioid induced hyperalgesia (OIH). Opioid induced hyperalgesia is not usually seen in the absence of tolerance to opioid analgesia. Clinically, hyperalgesia may be diagnosed if the patient on long-term opioid therapy presents with increased pain. This might be qualitatively and anatomically distinct from pain related to disease progression or to breakthrough pain resulting from development of opioid tolerance. Pain associated with hyperalgesia tends to be more diffuse than the pre-existing pain and less defined in quality. Management of opioid induced hyperalgesia requires opioid dose reduction.  Cancer: Chronic opioid therapy has been associated with an increased risk of cancer among noncancer patients with chronic pain. This association was more evident in chronic strong opioid users. Chronic opioid consumption causes significant pathological changes in the small intestine and colon. Epidemiological studies have found that there is a link between opium  dependence and initiation of gastrointestinal cancers. Cancer is the second leading cause of death after cardiovascular disease.  Chronic use of opioids can cause multiple conditions such as GERD, immunosuppression and renal damage as well as carcinogenic effects, which are associated with the incidence of cancers.   Mortality: Long-term opioid use has been associated with increased mortality among patients with chronic non-cancer pain (CNCP).  Prescription of long-acting opioids for chronic noncancer pain was associated with a significantly increased risk of all-cause mortality, including deaths from causes other than overdose.  Reference: Von Korff M, Kolodny A, Deyo RA, Chou R. Long-term opioid therapy reconsidered. Ann Intern Med. 2011 Sep 6;155(5):325-8. doi: 10.7326/[id number]. PMID: 78106373; PMCID: EFR6719914. Kit JINNY Laurence CINDERELLA Pearley JINNY, Hayward RA, Dunn KM, Jordan KP. Risk of adverse events in patients prescribed long-term opioids: A cohort study in the [place] Clinical Practice Research [employer]. Eur J Pain. 2019 May;23(5):908-922. doi: 10.1002/ejp.1357. Epub 2019 Jan 31. PMID: 69379883. Colameco S, Coren JS, Ciervo CA. Continuous opioid treatment for chronic noncancer pain: a time for moderation in prescribing. Postgrad Med. 2009 Jul;121(4):61-6. doi: 10.3810/pgm.2009.07.2032. PMID: 80358728. Gigi JONELLE Shlomo MILUS Levern IVER Conny RN, [place], Blazina I, Lonell DASEN, Bougatsos C, Deyo RA. The effectiveness and risks of long-term opioid therapy for chronic pain: a systematic review for a [employer] of [employer]ways to [employer]. Ann Intern Med. 2015 Feb 17;162(4):276-86. doi: 10.7326/M14-2559. PMID: 74418742. Rory CHRISTELLA Laurence [facility], [place] DM. NCHS Data Brief No. 22. [place]: Centers for Disease Control and Prevention; 2009. Sep, Increase in Fatal Poisonings Involving Opioid Analgesics in the [place] , 1999-2006. Song IA, Choi HR, Oh TK. Long-term opioid use and mortality in patients with chronic non-cancer pain: Ten-year follow-up study in [place] from 2010 through 2019.  EClinicalMedicine. 2022 Jul 18;51:101558. doi: 10.1016/j.eclinm.2022.898441. PMID: 64124182; PMCID: EFR0695089. Huser, W., Schubert, T., Vogelmann, T. et al. All-cause mortality in patients with long-term opioid therapy compared with non-opioid analgesics for chronic non-cancer pain: a database study. BMC Med 18, 162 (2020). http://lester.info/ Rashidian H, Zendehdel K, Kamangar F, Malekzadeh R, Haghdoost AA. An Ecological Study of the Association between Opiate Use and Incidence of Cancers. Addict Health. 2016 Fall;8(4):252-260. PMID: 71180443; PMCID: EFR4445194.  Our Goal: Our goal is to control your pain with means other than the use of opioid pain medications.  Our Recommendation: Talk to your physician about coming off of these medications. We can assist you with the tapering down and stopping these medicines. Based on the new information, even if you cannot completely stop the medication, a decrease in  the dose may be associated with a lesser risk. Ask for other means of controlling the pain. Decrease or eliminate those factors that significantly contribute to your pain such as smoking, obesity, and a diet heavily tilted towards inflammatory nutrients.  Last Updated: 09/09/2022   ______________________________________________________________________

## 2024-01-28 ENCOUNTER — Telehealth: Payer: Self-pay | Admitting: *Deleted

## 2024-01-28 NOTE — Telephone Encounter (Signed)
 Post procedure call:   no  questions or concerns.

## 2024-03-08 ENCOUNTER — Ambulatory Visit: Admitting: Pain Medicine

## 2024-03-09 ENCOUNTER — Ambulatory Visit: Admitting: Pain Medicine

## 2024-03-21 NOTE — Progress Notes (Signed)
 PROVIDER NOTE: Interpretation of information contained herein should be left to medically-trained personnel. Specific patient instructions are provided elsewhere under Patient Instructions section of medical record. This document was created in part using AI and STT-dictation technology, any transcriptional errors that may result from this process are unintentional.  Patient: Rodney Hall.  Service: E/M   PCP: Rodney Nancyann BRAVO, MD  DOB: [birth year]  DOS: 03/22/2024  Provider: Eric DELENA Como, MD  MRN: [id number]  Delivery: Face-to-face  Specialty: Interventional Pain Management  Type: Established Patient  Setting: Ambulatory outpatient facility  Specialty designation: 09  Referring Prov.: Rodney Nancyann BRAVO, MD  Location: Outpatient office facility       History of present illness (HPI) Mr. Rodney Desa., a 79 y.o. year old male, is here today because of his Low back pain of over 3 months duration [M54.50]. Rodney Hall primary complain today is No chief complaint on file.  Pertinent problems: Rodney Hall has Impingement syndrome of shoulder region; History of spinal fusion; Lumbar post-laminectomy syndrome; Lumbosacral spondylosis with radiculopathy; Abnormal CT scan, lumbar spine (06/17/2016); Failed back surgical syndrome; Chronic low back pain (1ry area of Pain) (Bilateral) w/o sciatica; Chronic lower extremity pain (Left); Epidural fibrosis; Lumbosacral radiculopathy at S1 (Left); Lumbosacral radiculopathy at L5 (Left); Lumbar facet hypertrophy (Multilevel) (Bilateral); Abnormal MRI, lumbar spine (02/04/2023) [facility]); Lumbar facet joint pain; Spondylosis without myelopathy or radiculopathy, lumbosacral region; Lumbar facet joint syndrome; Low back pain of over 3 months duration; Mechanical low back pain; Multifactorial low back pain; Intractable low back pain; and Discogenic low back pain on their pertinent problem list.  Pain Assessment: Severity of   is reported  as a  /10. Location:    / . Onset:  . Quality:  . Timing:  . Modifying factor(s):  [name] Vitals:  vitals were not taken for this visit.  BMI: Estimated body mass index is 27.26 kg/m as calculated from the following:   Height as of 01/27/24: 5' 10 (1.778 m).   Weight as of 01/27/24: 190 lb (86.2 kg).  Last encounter: 12/29/2023. Last procedure: 01/27/2024.  Reason for encounter: post-procedure evaluation and assessment.   Discussed the use of AI scribe software for clinical note transcription with the patient, who gave verbal consent to proceed.  History of Present Illness          Post-Procedure Evaluation   Procedure: Lumbar Facet, Medial Branch Radiofrequency Ablation (RFA) #1  Laterality: Left (-LT)  Level: L2, L3, L4, L5, and S1 Medial Branch Level(s). These levels will denervate the L3-4, L4-5, and L5-S1 lumbar facet joints.  Imaging: Fluoroscopy-guided Spinal ([id number]) Anesthesia: Local anesthesia (1-2% Lidocaine ) Anxiolysis: IV Versed  2.0 mg            Sedation: Minimal Sedation Fentanyl  1 mL (50 mcg) DOS: 01/27/2024  Performed by: Rodney DELENA Como, MD  Purpose: Therapeutic/Palliative Indications: Low back pain severe enough to impact quality of life or function. Indications: 1. 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 Low back pain of over 3 months duration   3. Lumbar facet joint pain   4. Lumbar facet hypertrophy (Multilevel) (Bilateral)   5. Lumbar facet joint syndrome   6. Spondylosis without myelopathy or radiculopathy, lumbosacral region    Rodney Hall has been dealing with the above chronic pain for longer than three months and has either failed to respond, was unable to tolerate, or simply did not get enough benefit from other more conservative  therapies including, but not limited to: 1. Over-the-counter medications 2. Anti-inflammatory medications 3. Muscle relaxants 4. Membrane stabilizers 5. Opioids 6. Physical therapy  and/or chiropractic manipulation 7. Modalities (Heat, ice, etc.) 8. Invasive techniques such as nerve blocks. Rodney Hall has attained more than 50% relief of the pain from a series of diagnostic injections conducted in separate occasions.  Pain Score: Pre-procedure: 4 /10 Post-procedure: 0-No pain/10    Effectiveness:  Initial hour after procedure:   ***. Subsequent 4-6 hours post-procedure:   ***. Analgesia past initial 6 hours:   ***. Ongoing improvement:  Analgesic:  *** Function:    ***    ROM:    ***    Interpretation: ***  Pharmacotherapy Assessment   Opioid Analgesic(s): No chronic opioid analgesics therapy prescribed by our practice. None. MME/day: 0 mg/day   Monitoring: [place] PMP: [name]       Pharmacotherapy: No side-effects or adverse reactions reported. Compliance: No problems identified. Effectiveness: Clinically acceptable.  No notes on file  UDS:  No results found for: SUMMARY  No results found for: CBDTHCR No results found for: D8THCCBX No results found for: D9THCCBX  ROS  Constitutional: Denies any fever or chills Gastrointestinal: No reported hemesis, hematochezia, vomiting, or acute GI distress Musculoskeletal: Denies any acute onset joint swelling, redness, loss of ROM, or weakness Neurological: No reported episodes of acute onset apraxia, aphasia, dysarthria, agnosia, amnesia, paralysis, loss of coordination, or loss of consciousness  Medication Review  acetaminophen , aspirin  EC, atorvastatin , ferrous sulfate, imipramine , omeprazole , tamsulosin , and terbinafine   History Review  Allergy: Rodney Hall is allergic to pravastatin  and amoxicillin. Drug: Rodney Hall  reports no history of drug use. Alcohol:  reports no history of alcohol use. Tobacco:  reports that he has never smoked. He has never been exposed to tobacco smoke. He has never used smokeless tobacco. Social: Rodney Hall  reports that he has never smoked.  He has never been exposed to tobacco smoke. He has never used smokeless tobacco. He reports that he does not drink alcohol and does not use drugs. 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 Surgical: Rodney Hall  has a past surgical history that includes Back surgery (2012); Neck surgery (2000); Cataract extraction, bilateral; Tonsillectomy and adenoidectomy; Anterior lat lumbar fusion (Right, 07/30/2016); EUS (N/A, 07/10/2017); Tennis elbow release/nirschel procedure; Eye surgery; Cholecystectomy (N/A, 07/25/2017); Colonoscopy with propofol  (N/A, 12/12/2017); Hemi-microdiscectomy lumbar laminectomy level 1 (09/2018); LEFT HEART CATH AND CORONARY ANGIOGRAPHY (N/A, 09/04/2022); Coronary artery bypass graft (N/A, 09/07/2022); TEE without cardioversion (N/A, 09/07/2022); LEFT HEART CATH AND CORONARY ANGIOGRAPHY (N/A, 09/06/2022); IABP Insertion (N/A, 09/06/2022); Colonoscopy with propofol  (N/A, 06/27/2023); Polypectomy (06/27/2023); Spine surgery (4 times); and Esophagogastroduodenoscopy (N/A, 11/21/2023). Family: family history includes Cancer in his mother; Diabetes in his brother and father; Heart disease in his sister; Rheumatic fever in his sister.  Laboratory Chemistry Profile   Renal Lab Results  Component Value Date   BUN 20 05/29/2023   CREATININE 1.82 (H) 05/29/2023   BCR 9 (L) 04/01/2023   GFRAA 58 (L) 01/13/2019   GFRNONAA 38 (L) 05/29/2023    Hepatic Lab Results  Component Value Date   AST 19 05/29/2023   ALT 14 05/29/2023  ALBUMIN  4.4  05/29/2023   ALKPHOS 61 05/29/2023   HCVAB <0.1 06/02/2017   LIPASE 39 05/29/2023    Electrolytes Lab Results  Component Value Date   NA 135 05/29/2023   K 4.7 05/29/2023   CL 102 05/29/2023   CALCIUM  9.2 05/29/2023   MG 2.3 09/08/2022   PHOS 3.5 02/22/2022    Bone No results found for: VD25OH, VD125OH2TOT, CI6874NY7, CI7874NY7, 25OHVITD1, 25OHVITD2, 25OHVITD3, TESTOFREE, TESTOSTERONE  Inflammation (CRP: Acute Phase) (ESR: Chronic Phase) No results found for: CRP, ESRSEDRATE, LATICACIDVEN       Note: Above Lab results reviewed.  Recent Imaging Review  DG PAIN CLINIC C-ARM 1-60 MIN NO REPORT Fluoro was used, but no Radiologist interpretation will be provided.  Please refer to NOTES tab for provider progress note. Note: Reviewed        Physical Exam  Vitals: There were no vitals taken for this visit. BMI: Estimated body mass index is 27.26 kg/m as calculated from the following:   Height as of 01/27/24: 5' 10 (1.778 m).   Weight as of 01/27/24: 190 lb (86.2 kg). Ideal: Patient weight not recorded General appearance: Well nourished, well developed, and well hydrated. In no apparent acute distress Mental status: Alert, oriented x 3 (person, place, & time)       Respiratory: No evidence of acute respiratory distress Eyes: PERLA   Assessment   Diagnosis Status  1. Low back pain of over 3 months duration   2. Lumbar facet joint pain   3. Chronic low back pain (1ry area of Pain) (Bilateral) w/o sciatica   4. Postop check    Controlled Controlled Controlled   Updated Problems: No problems updated.  Plan of Care  Problem-specific:  Assessment and Plan            Rodney Hall. has a current medication list which includes the following long-term medication(s): atorvastatin , ferrous sulfate, and imipramine .  Pharmacotherapy (Medications Ordered): No orders of the defined types were placed in this encounter.  Orders:  No  orders of the defined types were placed in this encounter.    Interventional Therapies  Risk Factors  Considerations  Medical Comorbidities:  Plavix  D/C'ed  CAD  Hx. NSTEMI  S/P CABG x5  Stage 3b CKD  T2NIDDM        Planned  Pending:   Therapeutic left lumbar facet RFA #1 (01/27/2024)    Under consideration:   Possible Racz procedure #2    Completed:   Therapeutic right lumbar facet RFA x1 (01/13/2024) (100/80/80/80)  Diagnostic bilateral lumbar facet MBB x2 (08/28/2023) (100/100/60/60)  Therapeutic left Racz procedure there x1 (06/12/2023) (100/100/80/80)  Diagnostic/therapeutic left caudal ESI + diagnostic epidurogram x1 (05/08/2023) (100/100/50/50)    Therapeutic  Palliative (PRN) options:   None established   Completed by other providers:   Surgery: Right L2-3 lateral transpsoas interbody fusion w/ lateral plating and L2-3 decompression (07/30/2016) by Morene DOROTHA Stammer, MD (Neurosurgery)  Surgery: Left L3-4 and L4-5 anterolateral fusion with interbody spacer and right L3-4 and L4-5 pedicle screws (03/13/2010) by Darina Boehringer, MD (Neurosurgery)  [facility] Procedures Date Name LateralityProvider Name and Address 12/03/2022 G-RDR SNRB                                             Charlie Dolores, MD 01/17/2022 G-RDR [facility] 2 Level  Charlie Dolores, MD 07/03/2021 G-RDR [facility] 2 Level                               Manuelita Shoulder 05/01/2021 G-JCB Trigger Point                                 Reyes Billing, MD 12/04/2020 G-RDR [facility] 2 Level                      Charlie Dolores, MD 07/18/2020 G-RDR SNRB                                              Charlie Dolores, MD 01/17/2020 shelbie Lumbar TFESIK U1 Level  Charleen Martins, MD 09/25/2018 Lumbar Laminectomy    09/20/2018 LAMINOTOMY (HEMILAMINECTOMY), DECOMPRESSION OF NERVE ROOTS, PARTIAL FACECTOTOMY, FORAMINOTOMY AND/OR DISC REMOVAL, LUMBAR (SURG) Shanda Lenis 07/30/2016 Lumbar Laminectomy    April Webb 03/07/2011 Lumbar Laminectomy   April Webb 03/06/2001 Lumbar Laminectomy      No follow-ups on file.    Recent Visits Date Type Provider Dept  01/27/24 Procedure visit Tanya Glisson, MD [facility]  01/13/24 Procedure visit Tanya Glisson, MD [facility]  12/29/23 Office Visit Tanya Glisson, MD [facility]  Showing recent visits within past 90 days and meeting all other requirements Future Appointments Date Type Provider Dept  03/22/24 Appointment Tanya Glisson, MD [facility]  Showing future appointments within next 90 days and meeting all other requirements  I discussed the assessment and treatment plan with the patient. The patient was provided an opportunity to ask questions and all were answered. The patient agreed with the plan and demonstrated an understanding of the instructions.  Patient advised to call back or seek an in-person evaluation if the symptoms or condition worsens.  Duration of encounter: *** minutes.  Total time on encounter, as per AMA guidelines included both the face-to-face and non-face-to-face time personally spent by the physician and/or other qualified health care professional(s) on the day of the encounter (includes time in activities that require the physician or other qualified health care professional and does not include time in activities normally performed by clinical staff). Physician's time may include the following activities when performed: Preparing to see the patient (e.g., pre-charting review of records, searching for previously ordered imaging, lab work, and nerve conduction tests) Review of prior analgesic pharmacotherapies. Reviewing PMP Interpreting ordered tests (e.g., lab work, imaging, nerve conduction tests) Performing post-procedure evaluations, including interpretation of diagnostic procedures Obtaining and/or reviewing separately obtained history Performing a  medically appropriate examination and/or evaluation Counseling and educating the patient/family/caregiver Ordering medications, tests, or procedures Referring and communicating with other health care professionals (when not separately reported) Documenting clinical information in the electronic or other health record Independently interpreting results (not separately reported) and communicating results to the patient/ family/caregiver Care coordination (not separately reported)  Note by: Glisson DELENA Tanya, MD (TTS and AI technology used. I apologize for any typographical errors that were not detected and corrected.) Date: 03/22/2024; Time: 6:58 PM

## 2024-03-22 ENCOUNTER — Encounter: Payer: Self-pay | Admitting: Pain Medicine

## 2024-03-22 ENCOUNTER — Ambulatory Visit: Attending: Pain Medicine | Admitting: Pain Medicine

## 2024-03-22 VITALS — BP 123/68 | HR 92 | Temp 97.2°F | Resp 16 | Ht 70.0 in | Wt 190.0 lb

## 2024-03-22 DIAGNOSIS — M545 Low back pain, unspecified: Secondary | ICD-10-CM | POA: Insufficient documentation

## 2024-03-22 DIAGNOSIS — G8929 Other chronic pain: Secondary | ICD-10-CM | POA: Diagnosis not present

## 2024-03-22 DIAGNOSIS — M5459 Other low back pain: Secondary | ICD-10-CM | POA: Diagnosis not present

## 2024-03-22 DIAGNOSIS — Z09 Encounter for follow-up examination after completed treatment for conditions other than malignant neoplasm: Secondary | ICD-10-CM | POA: Insufficient documentation

## 2024-03-22 DIAGNOSIS — R937 Abnormal findings on diagnostic imaging of other parts of musculoskeletal system: Secondary | ICD-10-CM | POA: Diagnosis not present

## 2024-03-22 DIAGNOSIS — M961 Postlaminectomy syndrome, not elsewhere classified: Secondary | ICD-10-CM | POA: Diagnosis not present

## 2024-03-22 NOTE — Progress Notes (Signed)
 Safety precautions to be maintained throughout the outpatient stay will include: orient to surroundings, keep bed in low position, maintain call bell within reach at all times, provide assistance with transfer out of bed and ambulation.

## 2024-03-22 NOTE — Patient Instructions (Signed)
 " ______________________________________________________________________    Procedure instructions  Stop blood-thinners  Do not eat or drink fluids (other than water ) for 8 hours before your procedure  No water  for 2 hours before your procedure  Take your blood pressure medicine with a sip of water   Arrive 30 minutes before your appointment  If sedation is planned, bring suitable driver. [place], [place], & public transportation are NOT APPROVED)  Carefully read the Preparing for your procedure detailed instructions  If you have questions call us  at [phone]  Procedure appointments are for procedures only.   NO medication refills or new problem evaluations will be done on procedure days.   Only the scheduled, pre-approved procedure and side will be done.   ______________________________________________________________________     ______________________________________________________________________    Preparing for your procedure  Appointments: If you think you may not be able to keep your appointment, call 24-48 hours in advance to cancel. We need time to make it available to others.  Procedure visits are for procedures only. During your procedure appointment there will be: NO Prescription Refills*. NO medication changes or discussions*. NO discussion of disability issues*. NO unrelated pain problem evaluations*. NO evaluations to order other pain procedures*. *These will be addressed at a separate and distinct evaluation encounter on the provider's evaluation schedule and not during procedure days.  Instructions: Food intake: Avoid eating anything solid for at least 8 hours prior to your procedure. Clear liquid intake: You may take clear liquids such as water  up to 2 hours prior to your procedure. (No carbonated drinks. No soda.) Transportation: Unless otherwise stated by your physician, bring a driver. (Driver cannot be a [employer]cher, Pharmacist, Community, or any other form of public  transportation.) Morning Medicines: Except for blood thinners, take all of your other morning medications with a sip of water . Make sure to take your heart and blood pressure medicines. If your blood pressure's lower number is above 100, the case will be rescheduled. Blood thinners: Make sure to stop your blood thinners as instructed.  If you take a blood thinner, but were not instructed to stop it, call our office [phone] and ask to talk to a nurse. Not stopping a blood thinner prior to certain procedures could lead to serious complications. Diabetics on insulin: Notify the staff so that you can be scheduled 1st case in the morning. If your diabetes requires high dose insulin, take only  of your normal insulin dose the morning of the procedure and notify the staff that you have done so. Preventing infections: Shower with an antibacterial soap the morning of your procedure.  Build-up your immune system: Take 1000 mg of Vitamin C with every meal (3 times a day) the day prior to your procedure. Antibiotics: Inform the nursing staff if you are taking any antibiotics or if you have any conditions that may require antibiotics prior to procedures. (Example: recent joint implants)   Pregnancy: If you are pregnant make sure to notify the nursing staff. Not doing so may result in injury to the fetus, including death.  Sickness: If you have a cold, fever, or any active infections, call and cancel or reschedule your procedure. Receiving steroids while having an infection may result in complications. Arrival: You must be in the facility at least 30 minutes prior to your scheduled procedure. Tardiness: Your scheduled time is also the cutoff time. If you do not arrive at least 15 minutes prior to your procedure, you will be rescheduled.  Children: Do not bring any children  with you. Make arrangements to keep them home. Dress appropriately: There is always a possibility that your clothing may get soiled. Avoid  long dresses. Valuables: Do not bring any jewelry or valuables.  Reasons to call and reschedule or cancel your procedure: (Following these recommendations will minimize the risk of a serious complication.) Surgeries: Avoid having procedures within 2 weeks of any surgery. (Avoid for 2 weeks before or after any surgery). Flu Shots: Avoid having procedures within 2 weeks of a flu shots or . (Avoid for 2 weeks before or after immunizations). Barium: Avoid having a procedure within 7-10 days after having had a radiological study involving the use of radiological contrast. (Myelograms, Barium swallow or enema study). Heart attacks: Avoid any elective procedures or surgeries for the initial 6 months after a Myocardial Infarction (Heart Attack). Blood thinners: It is imperative that you stop these medications before procedures. Let us  know if you if you take any blood thinner.  Infection: Avoid procedures during or within two weeks of an infection (including chest colds or gastrointestinal problems). Symptoms associated with infections include: Localized redness, fever, chills, night sweats or profuse sweating, burning sensation when voiding, cough, congestion, stuffiness, runny nose, sore throat, diarrhea, nausea, vomiting, cold or Flu symptoms, recent or current infections. It is specially important if the infection is over the area that we intend to treat. Heart and lung problems: Symptoms that may suggest an active cardiopulmonary problem include: cough, chest pain, breathing difficulties or shortness of breath, dizziness, ankle swelling, uncontrolled high or unusually low blood pressure, and/or palpitations. If you are experiencing any of these symptoms, cancel your procedure and contact your primary care physician for an evaluation.  Remember:  Regular Business hours are:  Monday to Thursday 8:00 AM to 4:00 PM  Provider's Schedule: Eric Como, MD:  Procedure days: Tuesday and Thursday 7:30  AM to 4:00 PM  Wallie Sherry, MD:  Procedure days: Monday and Wednesday 7:30 AM to 4:00 PM Last  Updated: 02/11/2023 ______________________________________________________________________     ______________________________________________________________________    General Risks and Possible Complications  Patient Responsibilities: It is important that you read this as it is part of your informed consent. It is our duty to inform you of the risks and possible complications associated with treatments offered to you. It is your responsibility as a patient to read this and to ask questions about anything that is not clear or that you believe was not covered in this document.  Patients Rights: You have the right to refuse treatment. You also have the right to change your mind, even after initially having agreed to have the treatment done. However, under this last option, if you wait until the last second to change your mind, you may be charged for the materials used up to that point.  Introduction: Medicine is not an visual merchandiser. Everything in Medicine, including the lack of treatment(s), carries the potential for danger, harm, or loss (which is by definition: Risk). In Medicine, a complication is a secondary problem, condition, or disease that can aggravate an already existing one. All treatments carry the risk of possible complications. The fact that a side effects or complications occurs, does not imply that the treatment was conducted incorrectly. It must be clearly understood that these can happen even when everything is done following the highest safety standards.  No treatment: You can choose not to proceed with the proposed treatment alternative. The PRO(s) would include: avoiding the risk of complications associated with the therapy. The CON(s) would  include: not getting any of the treatment benefits. These benefits fall under one of three categories: diagnostic; therapeutic; and/or  palliative. Diagnostic benefits include: getting information which can ultimately lead to improvement of the disease or symptom(s). Therapeutic benefits are those associated with the successful treatment of the disease. Finally, palliative benefits are those related to the decrease of the primary symptoms, without necessarily curing the condition (example: decreasing the pain from a flare-up of a chronic condition, such as incurable terminal cancer).  General Risks and Complications: These are associated to most interventional treatments. They can occur alone, or in combination. They fall under one of the following six (6) categories: no benefit or worsening of symptoms; bleeding; infection; nerve damage; allergic reactions; and/or death. No benefits or worsening of symptoms: In Medicine there are no guarantees, only probabilities. No healthcare provider can ever guarantee that a medical treatment will work, they can only state the probability that it may. Furthermore, there is always the possibility that the condition may worsen, either directly, or indirectly, as a consequence of the treatment. Bleeding: This is more common if the patient is taking a blood thinner, either prescription or over the counter (example: Goody Powders, Fish oil, Aspirin , Garlic, etc.), or if suffering a condition associated with impaired coagulation (example: Hemophilia, cirrhosis of the liver, low platelet counts, etc.). However, even if you do not have one on these, it can still happen. If you have any of these conditions, or take one of these drugs, make sure to notify your treating physician. Infection: This is more common in patients with a compromised immune system, either due to disease (example: diabetes, cancer, human immunodeficiency virus [HIV], etc.), or due to medications or treatments (example: therapies used to treat cancer and rheumatological diseases). However, even if you do not have one on these, it can still  happen. If you have any of these conditions, or take one of these drugs, make sure to notify your treating physician. Nerve Damage: This is more common when the treatment is an invasive one, but it can also happen with the use of medications, such as those used in the treatment of cancer. The damage can occur to small secondary nerves, or to large primary ones, such as those in the spinal cord and brain. This damage may be temporary or permanent and it may lead to impairments that can range from temporary numbness to permanent paralysis and/or brain death. Allergic Reactions: Any time a substance or material comes in contact with our body, there is the possibility of an allergic reaction. These can range from a mild skin rash (contact dermatitis) to a severe systemic reaction (anaphylactic reaction), which can result in death. Death: In general, any medical intervention can result in death, most of the time due to an unforeseen complication. ______________________________________________________________________      ______________________________________________________________________    Steroid injections  Common steroids for injections Triamcinolone : Used by many sports medicine physicians for large joint and bursal injections, often combined with a local anesthetic like lidocaine . A study focusing on coccydynia (tailbone pain) found triamcinolone  was more effective than betamethasone, suggesting it may also be preferable for other localized inflammation conditions. Methylprednisolone: A common alternative to triamcinolone  that is also a strong anti-inflammatory. It is available in different formulations, with the acetate suspension being the long-acting option for intra-articular injections. Dexamethasone : This is a non-particulate steroid, meaning it has a lower risk of tissue damage compared to particulate steroids like triamcinolone  and methylprednisolone. While less common for this specific  use, it is an option for targeted injections.   Considerations for physicians Particulate vs. non-particulate steroids: Triamcinolone  and methylprednisolone are particulate, meaning they can clump together. Dexamethasone  is non-particulate. Particulate steroids are often preferred for their longer-lasting effects but carry a theoretical higher risk for certain injections (though this is less of a concern in the costochondral joints). Combined injectate: Corticosteroids are typically mixed with a local anesthetic like lidocaine  to provide both immediate pain relief (from the anesthetic) and longer-term inflammation reduction (from the steroid). Imaging guidance: To ensure accurate placement of the needle and medication, physicians may use ultrasound or fluoroscopic guidance for the injection, especially in complex or refractory cases.   Patient guidance Before undergoing a steroid injection, discuss the options with your physician. They will determine the best steroid, dosage, and procedure for your specific case based on factors like: Severity of your condition History of response to other treatments Your overall health status Experience and preference of the physician  Last  Updated: 10/28/2023 ______________________________________________________________________     "

## 2024-03-29 NOTE — Progress Notes (Signed)
 PROVIDER NOTE: Interpretation of information contained herein should be left to medically-trained personnel. Specific patient instructions are provided elsewhere under Patient Instructions section of medical record. This document was created in part using STT-dictation technology, any transcriptional errors that may result from this process are unintentional.  Patient: Rodney Hall. Type: Established DOB: 1945/05/11 MRN: [id number] PCP: Gasper Nancyann BRAVO, MD  Service: Procedure DOS: 03/30/2024 Setting: Ambulatory Location: Ambulatory outpatient facility Delivery: Face-to-face Provider: Eric DELENA Como, MD Specialty: Interventional Pain Management Specialty designation: 09 Location: Outpatient facility Ref. Prov.: Como Eric, MD       Interventional Therapy   Type: Lumbar epidural steroid injection (LESI) (interlaminar) #1    Laterality: Left   Level:  L2-3 Level.  Imaging: Fluoroscopic guidance         Anesthesia: Local anesthesia (1-2% Lidocaine ) Anxiolysis: None Declined                    Analgesia: No Sedation None required. No Fentanyl  administered.         DOS: 03/30/2024  Performed by: Eric DELENA Como, MD  Purpose: Diagnostic/Therapeutic Indications: Lumbar radicular pain of intraspinal etiology of more than 4 weeks that has failed to respond to conservative therapy and is severe enough to impact quality of life or function. 1. 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 Chronic lower extremity pain (Left)   3. Discogenic low back pain   4. Epidural fibrosis   5. Failed back surgical syndrome   6. Intractable low back pain   7. Low back pain of over 3 months duration   8. Lumbar post-laminectomy syndrome   9. Lumbosacral radiculopathy at L5 (Left)   10. Lumbosacral radiculopathy at S1 (Left)   11. Lumbosacral spondylosis with radiculopathy   12. Multifactorial low back pain   13. Abnormal MRI, lumbar spine (02/04/2023)  [facility])   14. Encounter for therapeutic procedure    NAS-11 Pain score:   Pre-procedure: 2 /10   Post-procedure: 0-No pain/10      Position / Prep / Materials:  Position: Prone w/ head of the table raised (slight reverse trendelenburg) to facilitate breathing.  Prep solution: ChloraPrep (2% chlorhexidine  gluconate and 70% isopropyl alcohol) Prep Area: Entire Posterior Lumbar Region from lower scapular tip down to mid buttocks area and from flank to flank. Materials:  Tray: Epidural tray Needle(s):  Type: Epidural needle (Tuohy) Gauge (G):  17 Length: Regular (3.5-in) Qty: 1  H&P (Pre-op Assessment):  Rodney Hall is a 79 y.o. (year old), male patient, seen today for interventional treatment. He  has a past surgical history that includes Back surgery (2012); Neck surgery (2000); Cataract extraction, bilateral; Tonsillectomy and adenoidectomy; Anterior lat lumbar fusion (Right, 07/30/2016); EUS (N/A, 07/10/2017); Tennis elbow release/nirschel procedure; Eye surgery; Cholecystectomy (N/A, 07/25/2017); Colonoscopy with propofol  (N/A, 12/12/2017); Hemi-microdiscectomy lumbar laminectomy level 1 (09/2018); LEFT HEART CATH AND CORONARY ANGIOGRAPHY (N/A, 09/04/2022); Coronary artery bypass graft (N/A, 09/07/2022); TEE without cardioversion (N/A, 09/07/2022); LEFT HEART CATH AND CORONARY ANGIOGRAPHY (N/A, 09/06/2022); IABP Insertion (N/A, 09/06/2022); Colonoscopy with propofol  (N/A, 06/27/2023); Polypectomy (06/27/2023); Spine surgery (4 times); and Esophagogastroduodenoscopy (N/A, 11/21/2023). Rodney Hall has a current medication list which includes the following prescription(s): acetaminophen , aspirin  ec, atorvastatin , ferrous sulfate, imipramine , omeprazole , tamsulosin , and terbinafine , and the following Facility-Administered Medications: fentanyl , iohexol , lidocaine , midazolam  pf, pentafluoroprop-tetrafluoroeth, ropivacaine  (pf) 2 mg/ml (0.2%), sodium chloride  flush, and  triamcinolone  acetonide. His primarily concern today is the Back Pain (Lower left  side)  Initial Vital Signs:  Pulse/HCG Rate: (!) 101ECG Heart Rate: (!) 108 Temp: (!) 97.2 F (36.2 C) Resp: 18 BP: 102/75 SpO2: 99 %  BMI: Estimated body mass index is 27.26 kg/m as calculated from the following:   Height as of this encounter: 5' 10 (1.778 m).   Weight as of this encounter: 190 lb (86.2 kg).  Risk Assessment: Allergies: Reviewed. He is allergic to pravastatin  and amoxicillin.  Allergy Precautions: None required Coagulopathies: Reviewed. None identified.  Blood-thinner therapy: None at this time Active Infection(s): Reviewed. None identified. Rodney Hall is afebrile  Site Confirmation: Rodney Hall was asked to confirm the procedure and laterality before marking the site Procedure checklist: Completed Consent: Before the procedure and under the influence of no sedative(s), amnesic(s), or anxiolytics, the patient was informed of the treatment options, risks and possible complications. To fulfill our ethical and legal obligations, as recommended by the American Medical Association's Code of Ethics, I have informed the patient of my clinical impression; the nature and purpose of the treatment or procedure; the risks, benefits, and possible complications of the intervention; the alternatives, including doing nothing; the risk(s) and benefit(s) of the alternative treatment(s) or procedure(s); and the risk(s) and benefit(s) of doing nothing. The patient was provided information about the general risks and possible complications associated with the procedure. These may include, but are not limited to: failure to achieve desired goals, infection, bleeding, organ or nerve damage, allergic reactions, paralysis, and death. In addition, the patient was informed of those risks and complications associated to Spine-related procedures, such as failure to decrease pain; infection (i.e.: Meningitis, epidural or  intraspinal abscess); bleeding (i.e.: epidural hematoma, subarachnoid hemorrhage, or any other type of intraspinal or peri-dural bleeding); organ or nerve damage (i.e.: Any type of peripheral nerve, nerve root, or spinal cord injury) with subsequent damage to sensory, motor, and/or autonomic systems, resulting in permanent pain, numbness, and/or weakness of one or several areas of the body; allergic reactions; (i.e.: anaphylactic reaction); and/or death. Furthermore, the patient was informed of those risks and complications associated with the medications. These include, but are not limited to: allergic reactions (i.e.: anaphylactic or anaphylactoid reaction(s)); adrenal axis suppression; blood sugar elevation that in diabetics may result in ketoacidosis or comma; water  retention that in patients with history of congestive heart failure may result in shortness of breath, pulmonary edema, and decompensation with resultant heart failure; weight gain; swelling or edema; medication-induced neural toxicity; particulate matter embolism and blood vessel occlusion with resultant organ, and/or nervous system infarction; and/or aseptic necrosis of one or more joints. Finally, the patient was informed that Medicine is not an exact science; therefore, there is also the possibility of unforeseen or unpredictable risks and/or possible complications that may result in a catastrophic outcome. The patient indicated having understood very clearly. We have given the patient no guarantees and we have made no promises. Enough time was given to the patient to ask questions, all of which were answered to the patient's satisfaction. Mr. Karnes has indicated that he wanted to continue with the procedure. Attestation: I, the ordering provider, attest that I have discussed with the patient the benefits, risks, side-effects, alternatives, likelihood of achieving goals, and potential problems during recovery for the procedure that I have  provided informed consent. Date  Time: 03/30/2024 10:26 AM  Pre-Procedure Preparation:  Monitoring: As per clinic protocol. Respiration, ETCO2, SpO2, BP, heart rate and rhythm monitor placed and checked for adequate function Safety Precautions: Patient was assessed for positional  comfort and pressure points before starting the procedure. Time-out: I initiated and conducted the Time-out before starting the procedure, as per protocol. The patient was asked to participate by confirming the accuracy of the Time Out information. Verification of the correct person, site, and procedure were performed and confirmed by me, the nursing staff, and the patient. Time-out conducted as per Joint Commission's Universal Protocol (UP.01.01.01). Time: 1112 Start Time: 1112 hrs.  Description/Narrative of Procedure:          Target: Epidural space via interlaminar opening, initially targeting the lower laminar border of the superior vertebral body. Region: Lumbar Approach: Percutaneous paravertebral  Rationale (medical necessity): procedure needed and proper for the diagnosis and/or treatment of the patient's medical symptoms and needs. Procedural Technique Safety Precautions: Aspiration looking for blood return was conducted prior to all injections. At no point did we inject any substances, as a needle was being advanced. No attempts were made at seeking any paresthesias. Safe injection practices and needle disposal techniques used. Medications properly checked for expiration dates. SDV (single dose vial) medications used. Description of the Procedure: Protocol guidelines were followed. The procedure needle was introduced through the skin, ipsilateral to the reported pain, and advanced to the target area. Bone was contacted and the needle walked caudad, until the lamina was cleared. The epidural space was identified using loss-of-resistance technique with 2-3 ml of PF-NaCl (0.9% NSS), in a 5cc LOR glass  syringe.  Vitals:   03/30/24 1025 03/30/24 1110 03/30/24 1115  BP: 102/75 127/89 131/86  Pulse: (!) 101    Resp:  18 17  Temp: (!) 97.2 F (36.2 C)    SpO2: 99% 100% 100%  Weight: 190 lb (86.2 kg)    Height: 5' 10 (1.778 m)      Start Time: 1112 hrs. End Time: 1116 hrs.  Imaging Guidance (Spinal):          Type of Imaging Technique: Fluoroscopy Guidance (Spinal) Indication(s): Fluoroscopy guidance for needle placement to enhance accuracy in procedures requiring precise needle localization for targeted delivery of medication in or near specific anatomical locations not easily accessible without such real-time imaging assistance. Exposure Time: Please see nurses notes. Contrast: Before injecting any contrast, we confirmed that the patient did not have an allergy to iodine, shellfish, or radiological contrast. Once satisfactory needle placement was completed at the desired level, radiological contrast was injected. Contrast injected under live fluoroscopy. No contrast complications. See chart for type and volume of contrast used. Fluoroscopic Guidance: I was personally present during the use of fluoroscopy. Tunnel Vision Technique used to obtain the best possible view of the target area. Parallax error corrected before commencing the procedure. Direction-depth-direction technique used to introduce the needle under continuous pulsed fluoroscopy. Once target was reached, antero-posterior, oblique, and lateral fluoroscopic projection used confirm needle placement in all planes. Images permanently stored in EMR. Interpretation: I personally interpreted the imaging intraoperatively. Adequate needle placement confirmed in multiple planes. Appropriate spread of contrast into desired area was observed. No evidence of afferent or efferent intravascular uptake. No intrathecal or subarachnoid spread observed. Permanent images saved into the patient's record.  Antibiotic Prophylaxis:    Anti-infectives (From admission, onward)    None      Indication(s): None identified  Post-operative Assessment:  Post-procedure Vital Signs:  Pulse/HCG Rate: (!) 101(!) 101 Temp: (!) 97.2 F (36.2 C) Resp: 17 BP: 131/86 SpO2: 100 %  EBL: None  Complications: No immediate post-treatment complications observed by team, or reported by patient.  Note:  The patient tolerated the entire procedure well. A repeat set of vitals were taken after the procedure and the patient was kept under observation following institutional policy, for this type of procedure. Post-procedural neurological assessment was performed, showing return to baseline, prior to discharge. The patient was provided with post-procedure discharge instructions, including a section on how to identify potential problems. Should any problems arise concerning this procedure, the patient was given instructions to immediately contact us , at any time, without hesitation. In any case, we plan to contact the patient by telephone for a follow-up status report regarding this interventional procedure.  Comments:  No additional relevant information.  Plan of Care (POC)  Orders:  Orders Placed This Encounter  Procedures   Lumbar Epidural Injection    Scheduling Instructions:     Procedure: Interlaminar LESI L2-3     Laterality: Left     Procedural Analgesia/Anxiolysis: Patient's choice     Date: 03/30/2024    Where will this procedure be performed?:   [facility]   DG PAIN [facility] C-ARM 1-60 MIN NO REPORT    Intraoperative interpretation by procedural physician at [facility].    Standing Status:   Standing    Number of Occurrences:   1    Reason for exam::   Assistance in needle guidance and placement for procedures requiring needle placement in or near specific anatomical locations not easily accessible without such assistance.   Informed Consent Details: Physician/Practitioner Attestation; Transcribe to  consent form and obtain patient signature    Note: Always confirm laterality of pain with Mr. Talent, before procedure. Transcribe to consent form and obtain patient signature.    Physician/Practitioner attestation of informed consent for procedure/surgical case:   I, the physician/practitioner, attest that I have discussed with the patient the benefits, risks, side effects, alternatives, likelihood of achieving goals and potential problems during recovery for the procedure that I have provided informed consent.    Procedure:   Lumbar epidural steroid injection under fluoroscopic guidance    Physician/Practitioner performing the procedure:   Gwyndolyn Guilford A. Tanya, MD    Indication/Reason:   Low back and/or lower extremity pain secondary to lumbar radiculitis   Provide equipment / supplies at bedside    Procedural tray: Epidural Tray (Disposable  single use) Skin infiltration needle: Regular 1.5-in, 25-G, (x1) Block needle size: Regular standard Catheter: No catheter required    Standing Status:   Standing    Number of Occurrences:   1    Specify:   Epidural Tray   Saline lock IV    Have LR [phone] mL available and administer at 125 mL/hr if patient becomes hypotensive.    Standing Status:   Standing    Number of Occurrences:   1     Opioid Analgesic(s): No chronic opioid analgesics therapy prescribed by our practice. None. MME/day: 0 mg/day    Medications ordered for procedure: Meds ordered this encounter  Medications   iohexol  (OMNIPAQUE ) 180 MG/ML injection 10 mL    Must be Myelogram-compatible. If not available, you may substitute with a water -soluble, non-ionic, hypoallergenic, myelogram-compatible radiological contrast medium.   lidocaine  (XYLOCAINE ) 2 % (with pres) injection 400 mg   pentafluoroprop-tetrafluoroeth (GEBAUERS) aerosol 1 Application   midazolam  PF (VERSED ) injection 0.5-2 mg    Make sure Flumazenil is available in the pyxis when using this medication. If  oversedation occurs, administer 0.2 mg IV over 15 sec. If after 45 sec no response, administer 0.2 mg again over 1 min; may  repeat at 1 min intervals; not to exceed 4 doses (1 mg)   fentaNYL  (SUBLIMAZE ) injection 25-50 mcg    Make sure Narcan is available in the pyxis when using this medication. In the event of respiratory depression (RR< 8/min): Titrate NARCAN (naloxone) in increments of 0.1 to 0.2 mg IV at 2-3 minute intervals, until desired degree of reversal.   sodium chloride  flush (NS) 0.9 % injection 2 mL   ropivacaine  (PF) 2 mg/mL (0.2%) (NAROPIN ) injection 2 mL   triamcinolone  acetonide (KENALOG -40) injection 40 mg   Medications administered: Kelvin L. Googe Jr. had no medications administered during this visit.  See the medical record for exact dosing, route, and time of administration.    Interventional Therapies  Risk Factors  Considerations  Medical Comorbidities:  Plavix  D/C'ed  CAD  Hx. NSTEMI  S/P CABG x5  Stage 3b CKD  T2NIDDM        Planned  Pending:   Therapeutic Left L2-3 LESI #1    Under consideration:   Therapeutic Left L2-3 LESI #1  Possible Racz procedure #2    Completed:   Therapeutic left lumbar facet RFA x1 (01/27/2024) (100/80/80/80)  Therapeutic right lumbar facet RFA x1 (01/13/2024) (100/80/80/80)  Diagnostic bilateral lumbar facet MBB x2 (08/28/2023) (100/100/60/60)  Therapeutic left Racz procedure there x1 (06/12/2023) (100/100/80/80)  Diagnostic/therapeutic left caudal ESI + diagnostic epidurogram x1 (05/08/2023) (100/100/50/50)    Therapeutic  Palliative (PRN) options:   None established   Completed by other providers:   Surgery: Right L2-3 lateral transpsoas interbody fusion w/ lateral plating and L2-3 decompression (07/30/2016) by Morene DOROTHA Stammer, MD (Neurosurgery)  Surgery: Left L3-4 and L4-5 anterolateral fusion with interbody spacer and right L3-4 and L4-5 pedicle screws (03/13/2010) by Darina Boehringer, MD (Neurosurgery)   [facility] Procedures Date Name LateralityProvider Name and Address 12/03/2022 G-RDR SNRB                                             Charlie Dolores, MD 01/17/2022 G-RDR SNRB 2 Level                                          Charlie Dolores, MD 07/03/2021 G-RDR SNRB 2 Level                               Manuelita Shoulder 05/01/2021 G-JCB Trigger Point                                 Reyes Billing, MD 12/04/2020 G-RDR SNRB 2 Level                      Charlie Dolores, MD 07/18/2020 G-RDR SNRB                                              Charlie Dolores, MD 01/17/2020 [email or web address] Lumbar TFESIK U1 Level  Charleen Martins, MD 09/25/2018 Lumbar Laminectomy    09/20/2018 LAMINOTOMY (HEMILAMINECTOMY), DECOMPRESSION OF NERVE ROOTS, PARTIAL FACECTOTOMY, FORAMINOTOMY AND/OR DISC REMOVAL, LUMBAR (SURG) Shanda Lenis 07/30/2016  Lumbar Laminectomy   April Webb 03/07/2011 Lumbar Laminectomy   April Webb 03/06/2001 Lumbar Laminectomy       Follow-up plan:   Return in about 2 weeks (around 04/13/2024) for (Face2F), (PPE).     Recent Visits Date Type Provider Dept  03/22/24 Office Visit Tanya Glisson, MD [facility]  01/27/24 Procedure visit Tanya Glisson, MD [facility]  01/13/24 Procedure visit Tanya Glisson, MD [facility]  Showing recent visits within past 90 days and meeting all other requirements Today's Visits Date Type Provider Dept  03/30/24 Procedure visit Tanya Glisson, MD [facility]  Showing today's visits and meeting all other requirements Future Appointments No visits were found meeting these conditions. Showing future appointments within next 90 days and meeting all other requirements   Disposition: Discharge home  Discharge (Date  Time): 03/30/2024;   hrs.   Primary Care Physician: Gasper Nancyann BRAVO, MD Location: [facility] Note by: Glisson DELENA Tanya, MD (TTS technology used. I apologize for any  typographical errors that were not detected and corrected.) Date: 03/30/2024; Time: 11:21 AM  Disclaimer:  Medicine is not an visual merchandiser. The only guarantee in medicine is that nothing is guaranteed. It is important to note that the decision to proceed with this intervention was based on the information collected from the patient. The Data and conclusions were drawn from the patient's questionnaire, the interview, and the physical examination. Because the information was provided in large part by the patient, it cannot be guaranteed that it has not been purposely or unconsciously manipulated. Every effort has been made to obtain as much relevant data as possible for this evaluation. It is important to note that the conclusions that lead to this procedure are derived in large part from the available data. Always take into account that the treatment will also be dependent on availability of resources and existing treatment guidelines, considered by other Pain Management Practitioners as being common knowledge and practice, at the time of the intervention. For Medico-Legal purposes, it is also important to point out that variation in procedural techniques and pharmacological choices are the acceptable norm. The indications, contraindications, technique, and results of the above procedure should only be interpreted and judged by a Board-Certified Interventional Pain Specialist with extensive familiarity and expertise in the same exact procedure and technique.

## 2024-03-29 NOTE — Patient Instructions (Signed)
 ______________________________________________________________________    Post-Procedure Discharge Instructions  INSTRUCTIONS Apply ice:  Purpose: This will minimize any swelling and discomfort after procedure.  When: Day of procedure, as soon as you get home. How: Fill a plastic sandwich bag with crushed ice. Cover it with a small towel and apply to injection site. How long: (15 min on, 15 min off) Apply for 15 minutes then remove x 15 minutes.  Repeat sequence on day of procedure, until you go to bed. Apply heat:  Purpose: To treat any soreness and discomfort from the procedure. When: Starting the next day after the procedure. How: Apply heat to procedure site starting the day following the procedure. How long: May continue to repeat daily, until discomfort goes away. Food intake: Start with clear liquids (like water) and advance to regular food, as tolerated.  Physical activities: Keep activities to a minimum for the first 8 hours after the procedure. After that, then as tolerated. Driving: If you have received any sedation, be responsible and do not drive. You are not allowed to drive for 24 hours after having sedation. Blood thinner: (Applies only to those taking blood thinners) You may restart your blood thinner 6 hours after your procedure. Insulin: (Applies only to Diabetic patients taking insulin) As soon as you can eat, you may resume your normal dosing schedule. Infection prevention: Keep procedure site clean and dry. Shower daily and clean area with soap and water.  PAIN DIARY Post-procedure Pain Diary: Extremely important that this be done correctly and accurately. Recorded information will be used to determine the next step in treatment. For the purpose of accuracy, follow these rules: Evaluate only the area treated. Do not report or include pain from an untreated area. For the purpose of this evaluation, ignore all other areas of pain, except for the treated area. After your  procedure, avoid taking a long nap and attempting to complete the pain diary after you wake up. Instead, set your alarm clock to go off every hour, on the hour, for the initial 8 hours after the procedure. Document the duration of the numbing medicine, and the relief you are getting from it. Do not go to sleep and attempt to complete it later. It will not be accurate. If you received sedation, it is likely that you were given a medication that may cause amnesia. Because of this, completing the diary at a later time may cause the information to be inaccurate. This information is needed to plan your care. Follow-up appointment: Keep your post-procedure follow-up evaluation appointment after the procedure (usually 2 weeks for most procedures, 6 weeks for radiofrequencies). DO NOT FORGET to bring you pain diary with you.   EXPECT... (What should I expect to see with my procedure?) From numbing medicine (AKA: Local Anesthetics): Numbness or decrease in pain. You may also experience some weakness, which if present, could last for the duration of the local anesthetic. Onset: Full effect within 15 minutes of injected. Duration: It will depend on the type of local anesthetic used. On the average, 1 to 8 hours.  From steroids (Applies only if steroids were used): Decrease in swelling or inflammation. Once inflammation is improved, relief of the pain will follow. Onset of benefits: Depends on the amount of swelling present. The more swelling, the longer it will take for the benefits to be seen. In some cases, up to 10 days. Duration: Steroids will stay in the system x 2 weeks. Duration of benefits will depend on multiple posibilities including persistent irritating  factors. Side-effects: If present, they may typically last 2 weeks (the duration of the steroids). Frequent: Cramps (if they occur, drink Gatorade and take over-the-counter Magnesium 450-500 mg once to twice a day); water retention with temporary weight  gain; increases in blood sugar; decreased immune system response; increased appetite. Occasional: Facial flushing (red, warm cheeks); mood swings; menstrual changes. Uncommon: Long-term decrease or suppression of natural hormones; bone thinning. (These are more common with higher doses or more frequent use. This is why we prefer that our patients avoid having any injection therapies in other practices.)  Very Rare: Severe mood changes; psychosis; aseptic necrosis. From procedure: Some discomfort is to be expected once the numbing medicine wears off. This should be minimal if ice and heat are applied as instructed.  CALL IF... (When should I call?) You experience numbness and weakness that gets worse with time, as opposed to wearing off. New onset bowel or bladder incontinence. (Applies only to procedures done in the spine)  Emergency Numbers: Durning business hours (Monday - Thursday, 8:00 AM - 4:00 PM) (Friday, 9:00 AM - 12:00 Noon): [phone] After hours: [phone] NOTE: If you are having a problem and are unable connect with, or to talk to a provider, then go to your nearest urgent care or emergency department. If the problem is serious and urgent, please call 911. ______________________________________________________________________     ______________________________________________________________________    Steroid injections  Common steroids for injections Triamcinolone : Used by many sports medicine physicians for large joint and bursal injections, often combined with a local anesthetic like lidocaine . A study focusing on coccydynia (tailbone pain) found triamcinolone  was more effective than betamethasone, suggesting it may also be preferable for other localized inflammation conditions. Methylprednisolone: A common alternative to triamcinolone  that is also a strong anti-inflammatory. It is available in different formulations, with the acetate suspension being the long-acting  option for intra-articular injections. Dexamethasone : This is a non-particulate steroid, meaning it has a lower risk of tissue damage compared to particulate steroids like triamcinolone  and methylprednisolone. While less common for this specific use, it is an option for targeted injections.   Considerations for physicians Particulate vs. non-particulate steroids: Triamcinolone  and methylprednisolone are particulate, meaning they can clump together. Dexamethasone  is non-particulate. Particulate steroids are often preferred for their longer-lasting effects but carry a theoretical higher risk for certain injections (though this is less of a concern in the costochondral joints). Combined injectate: Corticosteroids are typically mixed with a local anesthetic like lidocaine  to provide both immediate pain relief (from the anesthetic) and longer-term inflammation reduction (from the steroid). Imaging guidance: To ensure accurate placement of the needle and medication, physicians may use ultrasound or fluoroscopic guidance for the injection, especially in complex or refractory cases.   Patient guidance Before undergoing a steroid injection, discuss the options with your physician. They will determine the best steroid, dosage, and procedure for your specific case based on factors like: Severity of your condition History of response to other treatments Your overall health status Experience and preference of the physician  Last  Updated: 10/28/2023 ______________________________________________________________________

## 2024-03-30 ENCOUNTER — Ambulatory Visit
Admission: RE | Admit: 2024-03-30 | Discharge: 2024-03-30 | Disposition: A | Discharge status: Home / Self Care | Source: Ambulatory Visit | Attending: Pain Medicine | Admitting: Pain Medicine

## 2024-03-30 ENCOUNTER — Ambulatory Visit (HOSPITAL_BASED_OUTPATIENT_CLINIC_OR_DEPARTMENT_OTHER): Admitting: Pain Medicine

## 2024-03-30 ENCOUNTER — Encounter: Payer: Self-pay | Admitting: Pain Medicine

## 2024-03-30 VITALS — BP 131/86 | HR 101 | Temp 97.2°F | Resp 17 | Ht 70.0 in | Wt 190.0 lb

## 2024-03-30 DIAGNOSIS — M5136 Other intervertebral disc degeneration, lumbar region with discogenic back pain only: Secondary | ICD-10-CM | POA: Insufficient documentation

## 2024-03-30 DIAGNOSIS — G96198 Other disorders of meninges, not elsewhere classified: Secondary | ICD-10-CM

## 2024-03-30 DIAGNOSIS — M4727 Other spondylosis with radiculopathy, lumbosacral region: Secondary | ICD-10-CM

## 2024-03-30 DIAGNOSIS — Z5189 Encounter for other specified aftercare: Secondary | ICD-10-CM

## 2024-03-30 DIAGNOSIS — M79605 Pain in left leg: Secondary | ICD-10-CM | POA: Diagnosis present

## 2024-03-30 DIAGNOSIS — G8929 Other chronic pain: Secondary | ICD-10-CM | POA: Insufficient documentation

## 2024-03-30 DIAGNOSIS — R937 Abnormal findings on diagnostic imaging of other parts of musculoskeletal system: Secondary | ICD-10-CM

## 2024-03-30 DIAGNOSIS — M961 Postlaminectomy syndrome, not elsewhere classified: Secondary | ICD-10-CM | POA: Diagnosis present

## 2024-03-30 DIAGNOSIS — M5459 Other low back pain: Secondary | ICD-10-CM | POA: Insufficient documentation

## 2024-03-30 DIAGNOSIS — M5417 Radiculopathy, lumbosacral region: Secondary | ICD-10-CM | POA: Diagnosis present

## 2024-03-30 DIAGNOSIS — M545 Low back pain, unspecified: Secondary | ICD-10-CM | POA: Diagnosis not present

## 2024-03-30 MED ORDER — IOHEXOL 180 MG/ML  SOLN
10.0000 mL | Freq: Once | INTRAMUSCULAR | Status: AC
  Administered 2024-03-30: 10 mL via EPIDURAL
  Filled 2024-03-30: qty 20

## 2024-03-30 MED ORDER — LIDOCAINE HCL 2 % IJ SOLN
20.0000 mL | Freq: Once | INTRAMUSCULAR | Status: AC
  Administered 2024-03-30: 400 mg
  Filled 2024-03-30: qty 20

## 2024-03-30 MED ORDER — SODIUM CHLORIDE 0.9% FLUSH
2.0000 mL | Freq: Once | INTRAVENOUS | Status: AC
  Administered 2024-03-30: 2 mL

## 2024-03-30 MED ORDER — PENTAFLUOROPROP-TETRAFLUOROETH EX AERO
1.0000 | INHALATION_SPRAY | Freq: Once | CUTANEOUS | Status: AC
  Administered 2024-03-30: 1 via TOPICAL

## 2024-03-30 MED ORDER — ROPIVACAINE HCL 2 MG/ML IJ SOLN
2.0000 mL | Freq: Once | INTRAMUSCULAR | Status: AC
  Administered 2024-03-30: 2 mL via EPIDURAL
  Filled 2024-03-30: qty 20

## 2024-03-30 MED ORDER — FENTANYL CITRATE (PF) 100 MCG/2ML IJ SOLN: 25.0000 ug | INTRAMUSCULAR | Status: DC | PRN

## 2024-03-30 MED ORDER — MIDAZOLAM HCL (PF) 2 MG/2ML IJ SOLN: 0.5000 mg | Freq: Once | INTRAMUSCULAR | Status: DC

## 2024-03-30 MED ORDER — TRIAMCINOLONE ACETONIDE 40 MG/ML IJ SUSP
40.0000 mg | Freq: Once | INTRAMUSCULAR | Status: AC
  Administered 2024-03-30: 40 mg
  Filled 2024-03-30: qty 1

## 2024-03-30 NOTE — Progress Notes (Signed)
 Safety precautions to be maintained throughout the outpatient stay will include: orient to surroundings, keep bed in low position, maintain call bell within reach at all times, provide assistance with transfer out of bed and ambulation.

## 2024-03-31 ENCOUNTER — Telehealth: Payer: Self-pay | Admitting: *Deleted

## 2024-03-31 NOTE — Telephone Encounter (Signed)
 Post procedure call:   no  questions or concerns.

## 2024-04-19 ENCOUNTER — Ambulatory Visit: Admitting: Pain Medicine

## 2024-04-21 ENCOUNTER — Ambulatory Visit: Admitting: Family Medicine

## 2024-05-18 ENCOUNTER — Ambulatory Visit: Admitting: Dermatology

## 2024-07-07 ENCOUNTER — Ambulatory Visit: Payer: Self-pay | Admitting: Physician Assistant

## 2024-07-14 ENCOUNTER — Ambulatory Visit

## 2024-12-15 ENCOUNTER — Ambulatory Visit: Admitting: Urology
